# Patient Record
Sex: Female | Born: 1960 | Race: Black or African American | Hispanic: No | Marital: Single | Smoking: Never smoker
Health system: Southern US, Community
[De-identification: ages and names within clinical notes are randomized; demographics above are authoritative.]

## PROBLEM LIST (undated history)

## (undated) DIAGNOSIS — M1990 Unspecified osteoarthritis, unspecified site: Secondary | ICD-10-CM

## (undated) DIAGNOSIS — Z8489 Family history of other specified conditions: Secondary | ICD-10-CM

## (undated) DIAGNOSIS — R55 Syncope and collapse: Secondary | ICD-10-CM

## (undated) DIAGNOSIS — K8020 Calculus of gallbladder without cholecystitis without obstruction: Secondary | ICD-10-CM

## (undated) DIAGNOSIS — M545 Low back pain, unspecified: Secondary | ICD-10-CM

## (undated) DIAGNOSIS — Z87442 Personal history of urinary calculi: Secondary | ICD-10-CM

## (undated) DIAGNOSIS — I10 Essential (primary) hypertension: Secondary | ICD-10-CM

## (undated) DIAGNOSIS — G8929 Other chronic pain: Secondary | ICD-10-CM

## (undated) DIAGNOSIS — N200 Calculus of kidney: Secondary | ICD-10-CM

## (undated) HISTORY — PX: INTRAUTERINE DEVICE INSERTION: SHX323

## (undated) HISTORY — PX: CYSTOSCOPY W/ STONE MANIPULATION: SHX1427

## (undated) HISTORY — PX: LAPAROSCOPIC CHOLECYSTECTOMY: SUR755

---

## 1997-12-09 ENCOUNTER — Encounter: Admission: RE | Admit: 1997-12-09 | Discharge: 1997-12-09 | Payer: Self-pay | Admitting: Family Medicine

## 1997-12-27 ENCOUNTER — Encounter: Admission: RE | Admit: 1997-12-27 | Discharge: 1997-12-27 | Payer: Self-pay | Admitting: Family Medicine

## 1998-01-17 ENCOUNTER — Encounter: Admission: RE | Admit: 1998-01-17 | Discharge: 1998-01-17 | Payer: Self-pay | Admitting: Family Medicine

## 1998-03-12 ENCOUNTER — Encounter: Admission: RE | Admit: 1998-03-12 | Discharge: 1998-03-12 | Payer: Self-pay | Admitting: Family Medicine

## 1998-03-19 ENCOUNTER — Encounter: Admission: RE | Admit: 1998-03-19 | Discharge: 1998-03-19 | Payer: Self-pay | Admitting: Family Medicine

## 1998-03-21 ENCOUNTER — Encounter: Admission: RE | Admit: 1998-03-21 | Discharge: 1998-03-21 | Payer: Self-pay | Admitting: Family Medicine

## 1998-04-11 ENCOUNTER — Encounter: Admission: RE | Admit: 1998-04-11 | Discharge: 1998-04-11 | Payer: Self-pay | Admitting: Family Medicine

## 1998-05-30 ENCOUNTER — Encounter: Admission: RE | Admit: 1998-05-30 | Discharge: 1998-05-30 | Payer: Self-pay | Admitting: Family Medicine

## 1998-07-21 ENCOUNTER — Encounter: Admission: RE | Admit: 1998-07-21 | Discharge: 1998-07-21 | Payer: Self-pay | Admitting: Family Medicine

## 1998-07-21 ENCOUNTER — Emergency Department (HOSPITAL_COMMUNITY): Admission: EM | Admit: 1998-07-21 | Discharge: 1998-07-21 | Payer: Self-pay | Admitting: Emergency Medicine

## 1998-07-23 ENCOUNTER — Encounter: Admission: RE | Admit: 1998-07-23 | Discharge: 1998-07-23 | Payer: Self-pay | Admitting: Family Medicine

## 1998-07-28 ENCOUNTER — Encounter: Admission: RE | Admit: 1998-07-28 | Discharge: 1998-07-28 | Payer: Self-pay | Admitting: Sports Medicine

## 1998-08-18 ENCOUNTER — Encounter: Admission: RE | Admit: 1998-08-18 | Discharge: 1998-08-18 | Payer: Self-pay | Admitting: Family Medicine

## 1998-08-26 ENCOUNTER — Emergency Department (HOSPITAL_COMMUNITY): Admission: EM | Admit: 1998-08-26 | Discharge: 1998-08-27 | Payer: Self-pay | Admitting: Emergency Medicine

## 1998-10-22 ENCOUNTER — Encounter: Admission: RE | Admit: 1998-10-22 | Discharge: 1998-10-22 | Payer: Self-pay | Admitting: Family Medicine

## 1998-10-23 ENCOUNTER — Encounter: Admission: RE | Admit: 1998-10-23 | Discharge: 1998-10-23 | Payer: Self-pay | Admitting: Family Medicine

## 1998-10-28 ENCOUNTER — Encounter: Admission: RE | Admit: 1998-10-28 | Discharge: 1998-10-28 | Payer: Self-pay | Admitting: Family Medicine

## 1998-11-07 ENCOUNTER — Encounter: Admission: RE | Admit: 1998-11-07 | Discharge: 1998-11-07 | Payer: Self-pay | Admitting: Family Medicine

## 1998-11-21 ENCOUNTER — Encounter: Admission: RE | Admit: 1998-11-21 | Discharge: 1998-11-21 | Payer: Self-pay | Admitting: Family Medicine

## 1999-01-30 ENCOUNTER — Encounter: Admission: RE | Admit: 1999-01-30 | Discharge: 1999-01-30 | Payer: Self-pay | Admitting: Family Medicine

## 1999-02-19 ENCOUNTER — Encounter: Admission: RE | Admit: 1999-02-19 | Discharge: 1999-02-19 | Payer: Self-pay | Admitting: Family Medicine

## 1999-03-20 ENCOUNTER — Encounter: Admission: RE | Admit: 1999-03-20 | Discharge: 1999-03-20 | Payer: Self-pay | Admitting: Family Medicine

## 1999-04-28 ENCOUNTER — Encounter: Admission: RE | Admit: 1999-04-28 | Discharge: 1999-04-28 | Payer: Self-pay | Admitting: Sports Medicine

## 1999-04-30 ENCOUNTER — Emergency Department (HOSPITAL_COMMUNITY): Admission: EM | Admit: 1999-04-30 | Discharge: 1999-04-30 | Payer: Self-pay | Admitting: Emergency Medicine

## 1999-04-30 ENCOUNTER — Encounter: Payer: Self-pay | Admitting: Emergency Medicine

## 1999-05-04 ENCOUNTER — Encounter: Admission: RE | Admit: 1999-05-04 | Discharge: 1999-05-04 | Payer: Self-pay | Admitting: Family Medicine

## 1999-05-07 ENCOUNTER — Ambulatory Visit (HOSPITAL_COMMUNITY): Admission: RE | Admit: 1999-05-07 | Discharge: 1999-05-07 | Payer: Self-pay | Admitting: Family Medicine

## 1999-05-07 ENCOUNTER — Encounter: Payer: Self-pay | Admitting: Family Medicine

## 1999-05-13 ENCOUNTER — Encounter: Admission: RE | Admit: 1999-05-13 | Discharge: 1999-05-13 | Payer: Self-pay | Admitting: Family Medicine

## 1999-05-21 ENCOUNTER — Encounter: Admission: RE | Admit: 1999-05-21 | Discharge: 1999-05-21 | Payer: Self-pay | Admitting: Family Medicine

## 1999-05-27 ENCOUNTER — Encounter: Admission: RE | Admit: 1999-05-27 | Discharge: 1999-05-27 | Payer: Self-pay | Admitting: Family Medicine

## 1999-06-11 ENCOUNTER — Encounter: Admission: RE | Admit: 1999-06-11 | Discharge: 1999-06-11 | Payer: Self-pay | Admitting: Family Medicine

## 1999-06-30 ENCOUNTER — Encounter: Admission: RE | Admit: 1999-06-30 | Discharge: 1999-06-30 | Payer: Self-pay | Admitting: Sports Medicine

## 1999-07-22 ENCOUNTER — Encounter: Admission: RE | Admit: 1999-07-22 | Discharge: 1999-07-22 | Payer: Self-pay | Admitting: Family Medicine

## 1999-07-27 ENCOUNTER — Encounter: Admission: RE | Admit: 1999-07-27 | Discharge: 1999-07-27 | Payer: Self-pay | Admitting: Family Medicine

## 1999-07-29 ENCOUNTER — Encounter: Admission: RE | Admit: 1999-07-29 | Discharge: 1999-07-29 | Payer: Self-pay | Admitting: Family Medicine

## 1999-08-03 ENCOUNTER — Encounter: Admission: RE | Admit: 1999-08-03 | Discharge: 1999-08-03 | Payer: Self-pay | Admitting: Family Medicine

## 1999-08-12 ENCOUNTER — Encounter: Admission: RE | Admit: 1999-08-12 | Discharge: 1999-08-12 | Payer: Self-pay | Admitting: Family Medicine

## 1999-08-18 ENCOUNTER — Encounter: Admission: RE | Admit: 1999-08-18 | Discharge: 1999-08-18 | Payer: Self-pay | Admitting: Sports Medicine

## 1999-10-09 ENCOUNTER — Encounter: Admission: RE | Admit: 1999-10-09 | Discharge: 2000-01-07 | Payer: Self-pay | Admitting: Sports Medicine

## 1999-11-04 ENCOUNTER — Encounter: Admission: RE | Admit: 1999-11-04 | Discharge: 1999-11-04 | Payer: Self-pay | Admitting: Family Medicine

## 1999-11-11 ENCOUNTER — Encounter: Admission: RE | Admit: 1999-11-11 | Discharge: 1999-11-11 | Payer: Self-pay | Admitting: Family Medicine

## 2000-01-01 ENCOUNTER — Encounter: Admission: RE | Admit: 2000-01-01 | Discharge: 2000-01-01 | Payer: Self-pay | Admitting: Family Medicine

## 2000-01-08 ENCOUNTER — Encounter: Admission: RE | Admit: 2000-01-08 | Discharge: 2000-02-11 | Payer: Self-pay | Admitting: Sports Medicine

## 2001-02-23 ENCOUNTER — Emergency Department (HOSPITAL_COMMUNITY): Admission: EM | Admit: 2001-02-23 | Discharge: 2001-02-24 | Payer: Self-pay | Admitting: Emergency Medicine

## 2001-02-23 ENCOUNTER — Encounter: Payer: Self-pay | Admitting: Emergency Medicine

## 2001-02-27 ENCOUNTER — Encounter: Admission: RE | Admit: 2001-02-27 | Discharge: 2001-02-27 | Payer: Self-pay | Admitting: Family Medicine

## 2001-03-08 ENCOUNTER — Inpatient Hospital Stay (HOSPITAL_COMMUNITY): Admission: AD | Admit: 2001-03-08 | Discharge: 2001-03-08 | Payer: Self-pay | Admitting: Obstetrics

## 2001-03-08 ENCOUNTER — Encounter: Payer: Self-pay | Admitting: Obstetrics

## 2001-03-10 ENCOUNTER — Encounter: Payer: Self-pay | Admitting: Emergency Medicine

## 2001-03-10 ENCOUNTER — Inpatient Hospital Stay (HOSPITAL_COMMUNITY): Admission: AD | Admit: 2001-03-10 | Discharge: 2001-03-10 | Payer: Self-pay | Admitting: Obstetrics & Gynecology

## 2001-03-13 ENCOUNTER — Encounter (INDEPENDENT_AMBULATORY_CARE_PROVIDER_SITE_OTHER): Payer: Self-pay | Admitting: Specialist

## 2001-03-13 ENCOUNTER — Inpatient Hospital Stay (HOSPITAL_COMMUNITY): Admission: AD | Admit: 2001-03-13 | Discharge: 2001-03-13 | Payer: Self-pay | Admitting: Obstetrics & Gynecology

## 2001-03-15 ENCOUNTER — Encounter: Admission: RE | Admit: 2001-03-15 | Discharge: 2001-03-15 | Payer: Self-pay | Admitting: Family Medicine

## 2001-03-20 ENCOUNTER — Encounter: Admission: RE | Admit: 2001-03-20 | Discharge: 2001-03-20 | Payer: Self-pay | Admitting: Family Medicine

## 2001-03-23 ENCOUNTER — Encounter: Admission: RE | Admit: 2001-03-23 | Discharge: 2001-03-23 | Payer: Self-pay | Admitting: Family Medicine

## 2001-10-11 ENCOUNTER — Encounter: Admission: RE | Admit: 2001-10-11 | Discharge: 2001-10-11 | Payer: Self-pay | Admitting: Family Medicine

## 2001-10-18 ENCOUNTER — Encounter: Admission: RE | Admit: 2001-10-18 | Discharge: 2001-10-18 | Payer: Self-pay | Admitting: Family Medicine

## 2001-10-19 ENCOUNTER — Inpatient Hospital Stay (HOSPITAL_COMMUNITY): Admission: AD | Admit: 2001-10-19 | Discharge: 2001-10-19 | Payer: Self-pay | Admitting: *Deleted

## 2001-10-19 ENCOUNTER — Encounter: Admission: RE | Admit: 2001-10-19 | Discharge: 2001-10-19 | Payer: Self-pay | Admitting: Family Medicine

## 2001-10-25 ENCOUNTER — Encounter: Admission: RE | Admit: 2001-10-25 | Discharge: 2001-10-25 | Payer: Self-pay | Admitting: Family Medicine

## 2001-11-08 ENCOUNTER — Encounter: Admission: RE | Admit: 2001-11-08 | Discharge: 2001-11-08 | Payer: Self-pay | Admitting: Family Medicine

## 2001-11-20 ENCOUNTER — Emergency Department (HOSPITAL_COMMUNITY): Admission: EM | Admit: 2001-11-20 | Discharge: 2001-11-20 | Payer: Self-pay | Admitting: Emergency Medicine

## 2001-12-08 ENCOUNTER — Encounter: Admission: RE | Admit: 2001-12-08 | Discharge: 2001-12-08 | Payer: Self-pay | Admitting: Family Medicine

## 2001-12-11 ENCOUNTER — Encounter: Admission: RE | Admit: 2001-12-11 | Discharge: 2001-12-11 | Payer: Self-pay | Admitting: Family Medicine

## 2001-12-19 ENCOUNTER — Ambulatory Visit (HOSPITAL_COMMUNITY): Admission: RE | Admit: 2001-12-19 | Discharge: 2001-12-19 | Payer: Self-pay | Admitting: *Deleted

## 2001-12-19 ENCOUNTER — Encounter: Payer: Self-pay | Admitting: *Deleted

## 2002-01-08 ENCOUNTER — Encounter: Admission: RE | Admit: 2002-01-08 | Discharge: 2002-01-08 | Payer: Self-pay | Admitting: Family Medicine

## 2002-01-12 ENCOUNTER — Encounter: Admission: RE | Admit: 2002-01-12 | Discharge: 2002-01-12 | Payer: Self-pay | Admitting: Family Medicine

## 2002-01-16 ENCOUNTER — Encounter: Admission: RE | Admit: 2002-01-16 | Discharge: 2002-01-16 | Payer: Self-pay | Admitting: Family Medicine

## 2002-01-26 ENCOUNTER — Encounter: Admission: RE | Admit: 2002-01-26 | Discharge: 2002-01-26 | Payer: Self-pay | Admitting: Family Medicine

## 2002-02-02 ENCOUNTER — Encounter: Admission: RE | Admit: 2002-02-02 | Discharge: 2002-02-02 | Payer: Self-pay | Admitting: Family Medicine

## 2002-02-05 ENCOUNTER — Emergency Department (HOSPITAL_COMMUNITY): Admission: EM | Admit: 2002-02-05 | Discharge: 2002-02-06 | Payer: Self-pay | Admitting: Emergency Medicine

## 2002-02-06 ENCOUNTER — Encounter: Payer: Self-pay | Admitting: Emergency Medicine

## 2002-02-06 ENCOUNTER — Encounter: Admission: RE | Admit: 2002-02-06 | Discharge: 2002-02-06 | Payer: Self-pay | Admitting: Family Medicine

## 2002-02-09 ENCOUNTER — Encounter: Admission: RE | Admit: 2002-02-09 | Discharge: 2002-02-09 | Payer: Self-pay | Admitting: Family Medicine

## 2002-02-27 ENCOUNTER — Encounter: Admission: RE | Admit: 2002-02-27 | Discharge: 2002-02-27 | Payer: Self-pay | Admitting: Family Medicine

## 2002-02-28 ENCOUNTER — Encounter: Admission: RE | Admit: 2002-02-28 | Discharge: 2002-02-28 | Payer: Self-pay | Admitting: Family Medicine

## 2002-03-13 ENCOUNTER — Encounter: Admission: RE | Admit: 2002-03-13 | Discharge: 2002-03-13 | Payer: Self-pay | Admitting: Family Medicine

## 2002-03-20 ENCOUNTER — Encounter: Payer: Self-pay | Admitting: *Deleted

## 2002-03-20 ENCOUNTER — Inpatient Hospital Stay (HOSPITAL_COMMUNITY): Admission: AD | Admit: 2002-03-20 | Discharge: 2002-03-21 | Payer: Self-pay | Admitting: *Deleted

## 2002-03-22 ENCOUNTER — Encounter: Admission: RE | Admit: 2002-03-22 | Discharge: 2002-03-22 | Payer: Self-pay | Admitting: Family Medicine

## 2002-03-23 ENCOUNTER — Encounter: Admission: RE | Admit: 2002-03-23 | Discharge: 2002-03-23 | Payer: Self-pay | Admitting: Family Medicine

## 2002-03-26 ENCOUNTER — Encounter: Admission: RE | Admit: 2002-03-26 | Discharge: 2002-03-26 | Payer: Self-pay | Admitting: Family Medicine

## 2002-03-27 ENCOUNTER — Encounter: Admission: RE | Admit: 2002-03-27 | Discharge: 2002-03-27 | Payer: Self-pay | Admitting: *Deleted

## 2002-04-02 ENCOUNTER — Encounter: Admission: RE | Admit: 2002-04-02 | Discharge: 2002-04-02 | Payer: Self-pay | Admitting: Family Medicine

## 2002-04-09 ENCOUNTER — Encounter: Admission: RE | Admit: 2002-04-09 | Discharge: 2002-04-09 | Payer: Self-pay | Admitting: Family Medicine

## 2002-04-16 ENCOUNTER — Encounter: Admission: RE | Admit: 2002-04-16 | Discharge: 2002-04-16 | Payer: Self-pay | Admitting: Family Medicine

## 2002-04-18 ENCOUNTER — Ambulatory Visit (HOSPITAL_COMMUNITY): Admission: RE | Admit: 2002-04-18 | Discharge: 2002-04-18 | Payer: Self-pay | Admitting: Family Medicine

## 2002-04-24 ENCOUNTER — Encounter: Admission: RE | Admit: 2002-04-24 | Discharge: 2002-04-24 | Payer: Self-pay | Admitting: Family Medicine

## 2002-04-27 ENCOUNTER — Encounter: Admission: RE | Admit: 2002-04-27 | Discharge: 2002-04-27 | Payer: Self-pay | Admitting: Family Medicine

## 2002-04-29 ENCOUNTER — Inpatient Hospital Stay (HOSPITAL_COMMUNITY): Admission: AD | Admit: 2002-04-29 | Discharge: 2002-04-29 | Payer: Self-pay | Admitting: Family Medicine

## 2002-04-30 ENCOUNTER — Encounter: Payer: Self-pay | Admitting: Family Medicine

## 2002-05-02 ENCOUNTER — Encounter: Admission: RE | Admit: 2002-05-02 | Discharge: 2002-05-02 | Payer: Self-pay | Admitting: Family Medicine

## 2002-05-02 ENCOUNTER — Inpatient Hospital Stay (HOSPITAL_COMMUNITY): Admission: AD | Admit: 2002-05-02 | Discharge: 2002-05-02 | Payer: Self-pay | Admitting: Family Medicine

## 2002-05-07 ENCOUNTER — Encounter (INDEPENDENT_AMBULATORY_CARE_PROVIDER_SITE_OTHER): Payer: Self-pay | Admitting: *Deleted

## 2002-05-07 ENCOUNTER — Inpatient Hospital Stay (HOSPITAL_COMMUNITY): Admission: AD | Admit: 2002-05-07 | Discharge: 2002-05-09 | Payer: Self-pay | Admitting: Obstetrics and Gynecology

## 2002-05-10 ENCOUNTER — Encounter: Admission: RE | Admit: 2002-05-10 | Discharge: 2002-06-09 | Payer: Self-pay | Admitting: *Deleted

## 2002-06-01 ENCOUNTER — Encounter: Admission: RE | Admit: 2002-06-01 | Discharge: 2002-06-01 | Payer: Self-pay | Admitting: Family Medicine

## 2002-06-20 ENCOUNTER — Encounter: Admission: RE | Admit: 2002-06-20 | Discharge: 2002-06-20 | Payer: Self-pay | Admitting: Sports Medicine

## 2002-06-20 ENCOUNTER — Other Ambulatory Visit: Admission: RE | Admit: 2002-06-20 | Discharge: 2002-06-20 | Payer: Self-pay | Admitting: Family Medicine

## 2002-06-27 ENCOUNTER — Encounter: Admission: RE | Admit: 2002-06-27 | Discharge: 2002-06-27 | Payer: Self-pay | Admitting: Family Medicine

## 2002-06-28 ENCOUNTER — Encounter: Payer: Self-pay | Admitting: *Deleted

## 2002-06-28 ENCOUNTER — Inpatient Hospital Stay (HOSPITAL_COMMUNITY): Admission: AD | Admit: 2002-06-28 | Discharge: 2002-07-02 | Payer: Self-pay | Admitting: Family Medicine

## 2002-06-28 ENCOUNTER — Ambulatory Visit (HOSPITAL_COMMUNITY): Admission: RE | Admit: 2002-06-28 | Discharge: 2002-06-28 | Payer: Self-pay | Admitting: *Deleted

## 2002-06-29 ENCOUNTER — Encounter: Payer: Self-pay | Admitting: Family Medicine

## 2002-06-30 ENCOUNTER — Encounter: Payer: Self-pay | Admitting: Family Medicine

## 2002-06-30 ENCOUNTER — Encounter (INDEPENDENT_AMBULATORY_CARE_PROVIDER_SITE_OTHER): Payer: Self-pay | Admitting: *Deleted

## 2002-07-06 ENCOUNTER — Encounter: Admission: RE | Admit: 2002-07-06 | Discharge: 2002-07-06 | Payer: Self-pay | Admitting: Family Medicine

## 2002-07-09 ENCOUNTER — Emergency Department (HOSPITAL_COMMUNITY): Admission: EM | Admit: 2002-07-09 | Discharge: 2002-07-09 | Payer: Self-pay | Admitting: Emergency Medicine

## 2002-07-10 ENCOUNTER — Encounter: Admission: RE | Admit: 2002-07-10 | Discharge: 2002-08-09 | Payer: Self-pay | Admitting: *Deleted

## 2002-07-13 ENCOUNTER — Encounter: Admission: RE | Admit: 2002-07-13 | Discharge: 2002-07-13 | Payer: Self-pay | Admitting: Family Medicine

## 2002-07-20 ENCOUNTER — Encounter: Admission: RE | Admit: 2002-07-20 | Discharge: 2002-07-20 | Payer: Self-pay | Admitting: Sports Medicine

## 2002-07-20 ENCOUNTER — Encounter: Admission: RE | Admit: 2002-07-20 | Discharge: 2002-07-20 | Payer: Self-pay | Admitting: Family Medicine

## 2002-07-20 ENCOUNTER — Encounter: Payer: Self-pay | Admitting: Sports Medicine

## 2002-08-10 ENCOUNTER — Encounter: Admission: RE | Admit: 2002-08-10 | Discharge: 2002-09-09 | Payer: Self-pay | Admitting: *Deleted

## 2002-08-10 ENCOUNTER — Encounter: Admission: RE | Admit: 2002-08-10 | Discharge: 2002-08-10 | Payer: Self-pay | Admitting: Family Medicine

## 2002-08-27 ENCOUNTER — Encounter: Admission: RE | Admit: 2002-08-27 | Discharge: 2002-08-27 | Payer: Self-pay | Admitting: Family Medicine

## 2002-08-28 ENCOUNTER — Encounter: Admission: RE | Admit: 2002-08-28 | Discharge: 2002-08-28 | Payer: Self-pay | Admitting: Family Medicine

## 2002-08-30 ENCOUNTER — Encounter: Admission: RE | Admit: 2002-08-30 | Discharge: 2002-08-30 | Payer: Self-pay | Admitting: Family Medicine

## 2002-09-20 ENCOUNTER — Encounter: Admission: RE | Admit: 2002-09-20 | Discharge: 2002-09-20 | Payer: Self-pay | Admitting: Family Medicine

## 2002-10-01 ENCOUNTER — Emergency Department (HOSPITAL_COMMUNITY): Admission: EM | Admit: 2002-10-01 | Discharge: 2002-10-01 | Payer: Self-pay | Admitting: Emergency Medicine

## 2002-10-02 ENCOUNTER — Encounter: Admission: RE | Admit: 2002-10-02 | Discharge: 2002-10-02 | Payer: Self-pay | Admitting: Sports Medicine

## 2002-10-09 ENCOUNTER — Encounter: Admission: RE | Admit: 2002-10-09 | Discharge: 2002-11-08 | Payer: Self-pay | Admitting: *Deleted

## 2002-10-19 ENCOUNTER — Encounter: Admission: RE | Admit: 2002-10-19 | Discharge: 2002-10-19 | Payer: Self-pay | Admitting: Family Medicine

## 2002-11-26 ENCOUNTER — Encounter: Admission: RE | Admit: 2002-11-26 | Discharge: 2002-11-26 | Payer: Self-pay | Admitting: Sports Medicine

## 2002-11-28 ENCOUNTER — Ambulatory Visit (HOSPITAL_COMMUNITY): Admission: RE | Admit: 2002-11-28 | Discharge: 2002-11-28 | Payer: Self-pay | Admitting: Family Medicine

## 2002-11-28 ENCOUNTER — Encounter: Admission: RE | Admit: 2002-11-28 | Discharge: 2002-11-28 | Payer: Self-pay | Admitting: Family Medicine

## 2002-11-29 ENCOUNTER — Encounter: Admission: RE | Admit: 2002-11-29 | Discharge: 2002-11-29 | Payer: Self-pay | Admitting: Family Medicine

## 2002-11-30 ENCOUNTER — Encounter: Payer: Self-pay | Admitting: Sports Medicine

## 2002-11-30 ENCOUNTER — Ambulatory Visit (HOSPITAL_COMMUNITY): Admission: RE | Admit: 2002-11-30 | Discharge: 2002-11-30 | Payer: Self-pay | Admitting: *Deleted

## 2002-11-30 ENCOUNTER — Encounter: Admission: RE | Admit: 2002-11-30 | Discharge: 2002-11-30 | Payer: Self-pay | Admitting: Family Medicine

## 2002-12-04 ENCOUNTER — Inpatient Hospital Stay (HOSPITAL_COMMUNITY): Admission: AD | Admit: 2002-12-04 | Discharge: 2002-12-08 | Payer: Self-pay | Admitting: Family Medicine

## 2002-12-04 ENCOUNTER — Encounter: Admission: RE | Admit: 2002-12-04 | Discharge: 2002-12-04 | Payer: Self-pay | Admitting: Family Medicine

## 2002-12-09 ENCOUNTER — Encounter: Admission: RE | Admit: 2002-12-09 | Discharge: 2003-01-08 | Payer: Self-pay | Admitting: *Deleted

## 2002-12-10 ENCOUNTER — Encounter: Admission: RE | Admit: 2002-12-10 | Discharge: 2002-12-10 | Payer: Self-pay | Admitting: Family Medicine

## 2003-01-08 ENCOUNTER — Encounter: Admission: RE | Admit: 2003-01-08 | Discharge: 2003-01-08 | Payer: Self-pay | Admitting: Family Medicine

## 2003-01-09 ENCOUNTER — Encounter: Admission: RE | Admit: 2003-01-09 | Discharge: 2003-01-09 | Payer: Self-pay | Admitting: Family Medicine

## 2003-01-17 ENCOUNTER — Encounter: Admission: RE | Admit: 2003-01-17 | Discharge: 2003-01-17 | Payer: Self-pay | Admitting: Family Medicine

## 2003-02-08 ENCOUNTER — Encounter: Admission: RE | Admit: 2003-02-08 | Discharge: 2003-03-10 | Payer: Self-pay | Admitting: *Deleted

## 2003-02-18 ENCOUNTER — Encounter: Admission: RE | Admit: 2003-02-18 | Discharge: 2003-02-18 | Payer: Self-pay | Admitting: Sports Medicine

## 2003-03-11 ENCOUNTER — Emergency Department (HOSPITAL_COMMUNITY): Admission: EM | Admit: 2003-03-11 | Discharge: 2003-03-12 | Payer: Self-pay | Admitting: Emergency Medicine

## 2003-03-11 ENCOUNTER — Encounter: Admission: RE | Admit: 2003-03-11 | Discharge: 2003-04-10 | Payer: Self-pay | Admitting: Family Medicine

## 2003-04-09 ENCOUNTER — Encounter: Admission: RE | Admit: 2003-04-09 | Discharge: 2003-04-09 | Payer: Self-pay | Admitting: Family Medicine

## 2003-04-23 ENCOUNTER — Encounter: Admission: RE | Admit: 2003-04-23 | Discharge: 2003-04-23 | Payer: Self-pay | Admitting: Sports Medicine

## 2003-06-04 ENCOUNTER — Encounter: Admission: RE | Admit: 2003-06-04 | Discharge: 2003-06-04 | Payer: Self-pay | Admitting: Family Medicine

## 2003-07-18 ENCOUNTER — Encounter: Admission: RE | Admit: 2003-07-18 | Discharge: 2003-07-18 | Payer: Self-pay | Admitting: Family Medicine

## 2003-07-23 ENCOUNTER — Emergency Department (HOSPITAL_COMMUNITY): Admission: EM | Admit: 2003-07-23 | Discharge: 2003-07-24 | Payer: Self-pay | Admitting: Emergency Medicine

## 2003-07-25 ENCOUNTER — Inpatient Hospital Stay (HOSPITAL_COMMUNITY): Admission: AD | Admit: 2003-07-25 | Discharge: 2003-08-01 | Payer: Self-pay | Admitting: Family Medicine

## 2003-07-25 ENCOUNTER — Encounter: Admission: RE | Admit: 2003-07-25 | Discharge: 2003-07-25 | Payer: Self-pay | Admitting: Sports Medicine

## 2003-08-26 ENCOUNTER — Encounter: Admission: RE | Admit: 2003-08-26 | Discharge: 2003-08-26 | Payer: Self-pay | Admitting: Family Medicine

## 2003-09-30 ENCOUNTER — Encounter: Admission: RE | Admit: 2003-09-30 | Discharge: 2003-09-30 | Payer: Self-pay | Admitting: Family Medicine

## 2003-10-10 ENCOUNTER — Encounter: Admission: RE | Admit: 2003-10-10 | Discharge: 2003-10-10 | Payer: Self-pay | Admitting: Family Medicine

## 2003-10-12 ENCOUNTER — Emergency Department (HOSPITAL_COMMUNITY): Admission: EM | Admit: 2003-10-12 | Discharge: 2003-10-12 | Payer: Self-pay | Admitting: Emergency Medicine

## 2003-10-15 ENCOUNTER — Encounter: Admission: RE | Admit: 2003-10-15 | Discharge: 2003-10-15 | Payer: Self-pay | Admitting: Sports Medicine

## 2003-12-19 ENCOUNTER — Encounter: Admission: RE | Admit: 2003-12-19 | Discharge: 2003-12-19 | Payer: Self-pay | Admitting: Family Medicine

## 2004-01-03 ENCOUNTER — Encounter: Admission: RE | Admit: 2004-01-03 | Discharge: 2004-01-03 | Payer: Self-pay | Admitting: Family Medicine

## 2004-01-10 ENCOUNTER — Encounter: Admission: RE | Admit: 2004-01-10 | Discharge: 2004-01-10 | Payer: Self-pay | Admitting: Family Medicine

## 2004-02-06 ENCOUNTER — Encounter: Admission: RE | Admit: 2004-02-06 | Discharge: 2004-02-06 | Payer: Self-pay | Admitting: Sports Medicine

## 2004-03-19 ENCOUNTER — Emergency Department (HOSPITAL_COMMUNITY): Admission: EM | Admit: 2004-03-19 | Discharge: 2004-03-19 | Payer: Self-pay | Admitting: Emergency Medicine

## 2004-03-20 ENCOUNTER — Ambulatory Visit: Payer: Self-pay | Admitting: Family Medicine

## 2004-03-20 ENCOUNTER — Inpatient Hospital Stay (HOSPITAL_COMMUNITY): Admission: AD | Admit: 2004-03-20 | Discharge: 2004-03-25 | Payer: Self-pay | Admitting: Family Medicine

## 2004-04-13 ENCOUNTER — Ambulatory Visit: Payer: Self-pay | Admitting: Family Medicine

## 2004-04-21 ENCOUNTER — Ambulatory Visit: Payer: Self-pay | Admitting: Sports Medicine

## 2004-05-06 ENCOUNTER — Ambulatory Visit: Payer: Self-pay | Admitting: Sports Medicine

## 2004-05-28 ENCOUNTER — Ambulatory Visit (HOSPITAL_COMMUNITY): Admission: RE | Admit: 2004-05-28 | Discharge: 2004-05-28 | Payer: Self-pay | Admitting: Family Medicine

## 2004-06-20 ENCOUNTER — Emergency Department (HOSPITAL_COMMUNITY): Admission: EM | Admit: 2004-06-20 | Discharge: 2004-06-20 | Payer: Self-pay | Admitting: Emergency Medicine

## 2004-06-21 ENCOUNTER — Encounter (INDEPENDENT_AMBULATORY_CARE_PROVIDER_SITE_OTHER): Payer: Self-pay | Admitting: *Deleted

## 2004-06-22 ENCOUNTER — Inpatient Hospital Stay (HOSPITAL_COMMUNITY): Admission: EM | Admit: 2004-06-22 | Discharge: 2004-06-26 | Payer: Self-pay | Admitting: Emergency Medicine

## 2004-06-29 ENCOUNTER — Ambulatory Visit: Payer: Self-pay | Admitting: Family Medicine

## 2004-07-01 ENCOUNTER — Ambulatory Visit: Payer: Self-pay | Admitting: Family Medicine

## 2004-07-15 ENCOUNTER — Ambulatory Visit: Payer: Self-pay | Admitting: Family Medicine

## 2004-07-17 ENCOUNTER — Emergency Department (HOSPITAL_COMMUNITY): Admission: EM | Admit: 2004-07-17 | Discharge: 2004-07-17 | Payer: Self-pay | Admitting: Emergency Medicine

## 2004-07-19 ENCOUNTER — Emergency Department (HOSPITAL_COMMUNITY): Admission: EM | Admit: 2004-07-19 | Discharge: 2004-07-19 | Payer: Self-pay | Admitting: Emergency Medicine

## 2004-07-20 ENCOUNTER — Emergency Department (HOSPITAL_COMMUNITY): Admission: EM | Admit: 2004-07-20 | Discharge: 2004-07-20 | Payer: Self-pay | Admitting: Emergency Medicine

## 2004-07-21 ENCOUNTER — Ambulatory Visit: Payer: Self-pay | Admitting: Family Medicine

## 2004-07-21 ENCOUNTER — Emergency Department (HOSPITAL_COMMUNITY): Admission: EM | Admit: 2004-07-21 | Discharge: 2004-07-21 | Payer: Self-pay | Admitting: Emergency Medicine

## 2004-07-27 ENCOUNTER — Ambulatory Visit: Payer: Self-pay | Admitting: Family Medicine

## 2004-08-05 ENCOUNTER — Ambulatory Visit: Payer: Self-pay | Admitting: Family Medicine

## 2004-08-07 ENCOUNTER — Encounter: Admission: RE | Admit: 2004-08-07 | Discharge: 2004-09-23 | Payer: Self-pay | Admitting: Sports Medicine

## 2004-08-26 ENCOUNTER — Ambulatory Visit: Payer: Self-pay | Admitting: Family Medicine

## 2004-09-22 ENCOUNTER — Ambulatory Visit: Payer: Self-pay | Admitting: Family Medicine

## 2004-09-26 ENCOUNTER — Ambulatory Visit (HOSPITAL_COMMUNITY): Admission: RE | Admit: 2004-09-26 | Discharge: 2004-09-26 | Payer: Self-pay | Admitting: Vascular Surgery

## 2004-10-07 ENCOUNTER — Ambulatory Visit: Payer: Self-pay | Admitting: Family Medicine

## 2004-11-06 ENCOUNTER — Ambulatory Visit: Payer: Self-pay | Admitting: Family Medicine

## 2004-11-30 ENCOUNTER — Ambulatory Visit: Payer: Self-pay | Admitting: Family Medicine

## 2004-12-29 ENCOUNTER — Ambulatory Visit: Payer: Self-pay | Admitting: Family Medicine

## 2005-02-11 ENCOUNTER — Ambulatory Visit: Payer: Self-pay | Admitting: Sports Medicine

## 2005-03-16 ENCOUNTER — Emergency Department (HOSPITAL_COMMUNITY): Admission: EM | Admit: 2005-03-16 | Discharge: 2005-03-16 | Payer: Self-pay | Admitting: *Deleted

## 2005-03-24 ENCOUNTER — Ambulatory Visit: Payer: Self-pay | Admitting: Family Medicine

## 2005-04-02 ENCOUNTER — Ambulatory Visit: Payer: Self-pay | Admitting: Family Medicine

## 2005-04-15 ENCOUNTER — Emergency Department (HOSPITAL_COMMUNITY): Admission: EM | Admit: 2005-04-15 | Discharge: 2005-04-15 | Payer: Self-pay | Admitting: Emergency Medicine

## 2005-05-31 ENCOUNTER — Emergency Department (HOSPITAL_COMMUNITY): Admission: EM | Admit: 2005-05-31 | Discharge: 2005-05-31 | Payer: Self-pay | Admitting: Emergency Medicine

## 2006-07-17 ENCOUNTER — Emergency Department (HOSPITAL_COMMUNITY): Admission: EM | Admit: 2006-07-17 | Discharge: 2006-07-17 | Payer: Self-pay | Admitting: Emergency Medicine

## 2006-08-18 DIAGNOSIS — E669 Obesity, unspecified: Secondary | ICD-10-CM | POA: Insufficient documentation

## 2006-08-18 DIAGNOSIS — I10 Essential (primary) hypertension: Secondary | ICD-10-CM | POA: Insufficient documentation

## 2006-08-19 ENCOUNTER — Encounter (INDEPENDENT_AMBULATORY_CARE_PROVIDER_SITE_OTHER): Payer: Self-pay | Admitting: *Deleted

## 2006-09-29 ENCOUNTER — Encounter (INDEPENDENT_AMBULATORY_CARE_PROVIDER_SITE_OTHER): Payer: Self-pay | Admitting: Family Medicine

## 2006-09-29 ENCOUNTER — Encounter: Payer: Self-pay | Admitting: *Deleted

## 2006-09-29 ENCOUNTER — Telehealth: Payer: Self-pay | Admitting: *Deleted

## 2006-09-29 ENCOUNTER — Ambulatory Visit: Payer: Self-pay | Admitting: Family Medicine

## 2006-09-29 LAB — CONVERTED CEMR LAB
Beta hcg, urine, semiquantitative: NEGATIVE
GC Probe Amp, Genital: NEGATIVE
Glucose, Urine, Semiquant: NEGATIVE
Ketones, urine, test strip: NEGATIVE
Protein, U semiquant: 30
Urobilinogen, UA: 2

## 2006-09-30 ENCOUNTER — Encounter (INDEPENDENT_AMBULATORY_CARE_PROVIDER_SITE_OTHER): Payer: Self-pay | Admitting: Family Medicine

## 2006-10-05 ENCOUNTER — Telehealth: Payer: Self-pay | Admitting: *Deleted

## 2006-10-11 ENCOUNTER — Ambulatory Visit (HOSPITAL_COMMUNITY): Admission: RE | Admit: 2006-10-11 | Discharge: 2006-10-11 | Payer: Self-pay | Admitting: Family Medicine

## 2007-04-18 ENCOUNTER — Emergency Department (HOSPITAL_COMMUNITY): Admission: EM | Admit: 2007-04-18 | Discharge: 2007-04-18 | Payer: Self-pay | Admitting: Emergency Medicine

## 2007-04-18 ENCOUNTER — Encounter: Payer: Self-pay | Admitting: *Deleted

## 2007-04-26 ENCOUNTER — Emergency Department (HOSPITAL_COMMUNITY): Admission: EM | Admit: 2007-04-26 | Discharge: 2007-04-26 | Payer: Self-pay | Admitting: Emergency Medicine

## 2007-04-26 ENCOUNTER — Ambulatory Visit: Payer: Self-pay | Admitting: Family Medicine

## 2007-04-26 DIAGNOSIS — G43909 Migraine, unspecified, not intractable, without status migrainosus: Secondary | ICD-10-CM | POA: Insufficient documentation

## 2007-04-26 DIAGNOSIS — G43709 Chronic migraine without aura, not intractable, without status migrainosus: Secondary | ICD-10-CM

## 2007-04-28 ENCOUNTER — Ambulatory Visit: Payer: Self-pay | Admitting: Family Medicine

## 2007-04-28 ENCOUNTER — Inpatient Hospital Stay (HOSPITAL_COMMUNITY): Admission: AD | Admit: 2007-04-28 | Discharge: 2007-05-03 | Payer: Self-pay | Admitting: Family Medicine

## 2007-04-28 ENCOUNTER — Telehealth: Payer: Self-pay | Admitting: *Deleted

## 2007-04-28 ENCOUNTER — Encounter: Payer: Self-pay | Admitting: Family Medicine

## 2007-06-29 ENCOUNTER — Ambulatory Visit: Payer: Self-pay | Admitting: Family Medicine

## 2007-07-12 ENCOUNTER — Encounter (INDEPENDENT_AMBULATORY_CARE_PROVIDER_SITE_OTHER): Payer: Self-pay | Admitting: Family Medicine

## 2007-07-12 ENCOUNTER — Ambulatory Visit: Payer: Self-pay | Admitting: Family Medicine

## 2007-07-12 LAB — CONVERTED CEMR LAB
BUN: 8 mg/dL (ref 6–23)
CO2: 24 meq/L (ref 19–32)
Chloride: 105 meq/L (ref 96–112)
Cholesterol: 177 mg/dL (ref 0–200)
Glucose, Bld: 107 mg/dL — ABNORMAL HIGH (ref 70–99)
LDL Cholesterol: 95 mg/dL (ref 0–99)
Potassium: 4.1 meq/L (ref 3.5–5.3)
Sodium: 139 meq/L (ref 135–145)
Total CHOL/HDL Ratio: 2.5

## 2007-07-13 ENCOUNTER — Encounter (INDEPENDENT_AMBULATORY_CARE_PROVIDER_SITE_OTHER): Payer: Self-pay | Admitting: Family Medicine

## 2007-08-01 ENCOUNTER — Encounter: Payer: Self-pay | Admitting: Family Medicine

## 2007-09-05 ENCOUNTER — Encounter (INDEPENDENT_AMBULATORY_CARE_PROVIDER_SITE_OTHER): Payer: Self-pay | Admitting: *Deleted

## 2007-09-18 ENCOUNTER — Encounter (INDEPENDENT_AMBULATORY_CARE_PROVIDER_SITE_OTHER): Payer: Self-pay | Admitting: Family Medicine

## 2007-09-18 ENCOUNTER — Ambulatory Visit: Payer: Self-pay | Admitting: Family Medicine

## 2007-09-22 ENCOUNTER — Ambulatory Visit: Payer: Self-pay | Admitting: Family Medicine

## 2007-09-25 ENCOUNTER — Ambulatory Visit: Payer: Self-pay | Admitting: Family Medicine

## 2007-10-26 ENCOUNTER — Encounter: Payer: Self-pay | Admitting: *Deleted

## 2007-11-29 ENCOUNTER — Telehealth (INDEPENDENT_AMBULATORY_CARE_PROVIDER_SITE_OTHER): Payer: Self-pay | Admitting: Family Medicine

## 2007-11-29 ENCOUNTER — Emergency Department (HOSPITAL_COMMUNITY): Admission: EM | Admit: 2007-11-29 | Discharge: 2007-11-29 | Payer: Self-pay | Admitting: Emergency Medicine

## 2007-12-01 ENCOUNTER — Inpatient Hospital Stay (HOSPITAL_COMMUNITY): Admission: EM | Admit: 2007-12-01 | Discharge: 2007-12-04 | Payer: Self-pay | Admitting: Emergency Medicine

## 2007-12-06 ENCOUNTER — Emergency Department (HOSPITAL_COMMUNITY): Admission: EM | Admit: 2007-12-06 | Discharge: 2007-12-06 | Payer: Self-pay | Admitting: Emergency Medicine

## 2007-12-27 ENCOUNTER — Ambulatory Visit (HOSPITAL_COMMUNITY): Admission: RE | Admit: 2007-12-27 | Discharge: 2007-12-27 | Payer: Self-pay | Admitting: Urology

## 2008-01-11 ENCOUNTER — Emergency Department (HOSPITAL_COMMUNITY): Admission: EM | Admit: 2008-01-11 | Discharge: 2008-01-12 | Payer: Self-pay | Admitting: Emergency Medicine

## 2008-01-11 ENCOUNTER — Ambulatory Visit: Payer: Self-pay | Admitting: Sports Medicine

## 2008-02-12 ENCOUNTER — Ambulatory Visit: Payer: Self-pay | Admitting: Sports Medicine

## 2008-02-12 ENCOUNTER — Encounter (INDEPENDENT_AMBULATORY_CARE_PROVIDER_SITE_OTHER): Payer: Self-pay | Admitting: Family Medicine

## 2008-07-11 ENCOUNTER — Emergency Department (HOSPITAL_COMMUNITY): Admission: EM | Admit: 2008-07-11 | Discharge: 2008-07-11 | Payer: Self-pay | Admitting: Emergency Medicine

## 2008-07-16 ENCOUNTER — Ambulatory Visit: Payer: Self-pay | Admitting: Family Medicine

## 2008-07-16 ENCOUNTER — Encounter (INDEPENDENT_AMBULATORY_CARE_PROVIDER_SITE_OTHER): Payer: Self-pay | Admitting: Family Medicine

## 2008-07-16 DIAGNOSIS — R42 Dizziness and giddiness: Secondary | ICD-10-CM | POA: Insufficient documentation

## 2008-07-16 LAB — CONVERTED CEMR LAB
ALT: 15 units/L (ref 0–35)
AST: 14 units/L (ref 0–37)
Albumin: 4.3 g/dL (ref 3.5–5.2)
Alkaline Phosphatase: 55 units/L (ref 39–117)
Basophils Relative: 0 % (ref 0–1)
Calcium: 9.1 mg/dL (ref 8.4–10.5)
Cholesterol: 171 mg/dL (ref 0–200)
Eosinophils Absolute: 0.1 10*3/uL (ref 0.0–0.7)
LDL Cholesterol: 92 mg/dL (ref 0–99)
Monocytes Absolute: 0.2 10*3/uL (ref 0.1–1.0)
Monocytes Relative: 7 % (ref 3–12)
Neutro Abs: 1.6 10*3/uL — ABNORMAL LOW (ref 1.7–7.7)
Sodium: 139 meq/L (ref 135–145)
Total Bilirubin: 0.4 mg/dL (ref 0.3–1.2)
Triglycerides: 44 mg/dL (ref <150)
VLDL: 9 mg/dL (ref 0–40)
WBC: 3 10*3/uL — ABNORMAL LOW (ref 4.0–10.5)

## 2008-07-26 ENCOUNTER — Encounter: Payer: Self-pay | Admitting: *Deleted

## 2008-07-30 ENCOUNTER — Encounter (INDEPENDENT_AMBULATORY_CARE_PROVIDER_SITE_OTHER): Payer: Self-pay | Admitting: Family Medicine

## 2008-07-30 ENCOUNTER — Ambulatory Visit: Payer: Self-pay | Admitting: Family Medicine

## 2008-07-30 LAB — CONVERTED CEMR LAB
Eosinophils Relative: 3 % (ref 0–5)
HCT: 42.1 % (ref 36.0–46.0)
Hemoglobin: 14.5 g/dL (ref 12.0–15.0)
MCV: 89.6 fL (ref 78.0–100.0)
Neutro Abs: 2.3 10*3/uL (ref 1.7–7.7)
Neutrophils Relative %: 52 % (ref 43–77)
Platelets: 213 10*3/uL (ref 150–400)

## 2008-07-31 ENCOUNTER — Encounter (INDEPENDENT_AMBULATORY_CARE_PROVIDER_SITE_OTHER): Payer: Self-pay | Admitting: Family Medicine

## 2008-08-15 ENCOUNTER — Ambulatory Visit: Payer: Self-pay

## 2008-09-05 ENCOUNTER — Ambulatory Visit: Payer: Self-pay | Admitting: Family Medicine

## 2008-11-22 ENCOUNTER — Ambulatory Visit: Payer: Self-pay | Admitting: Family Medicine

## 2008-11-22 ENCOUNTER — Inpatient Hospital Stay (HOSPITAL_COMMUNITY): Admission: AD | Admit: 2008-11-22 | Discharge: 2008-11-26 | Payer: Self-pay | Admitting: Family Medicine

## 2008-12-26 ENCOUNTER — Telehealth: Payer: Self-pay | Admitting: Family Medicine

## 2008-12-31 ENCOUNTER — Emergency Department (HOSPITAL_COMMUNITY): Admission: EM | Admit: 2008-12-31 | Discharge: 2008-12-31 | Payer: Self-pay | Admitting: Emergency Medicine

## 2009-01-01 ENCOUNTER — Encounter: Payer: Self-pay | Admitting: Family Medicine

## 2009-01-03 ENCOUNTER — Ambulatory Visit: Payer: Self-pay | Admitting: Family Medicine

## 2009-01-03 ENCOUNTER — Encounter: Payer: Self-pay | Admitting: Family Medicine

## 2009-01-20 ENCOUNTER — Emergency Department (HOSPITAL_COMMUNITY): Admission: EM | Admit: 2009-01-20 | Discharge: 2009-01-21 | Payer: Self-pay | Admitting: Emergency Medicine

## 2009-01-30 ENCOUNTER — Ambulatory Visit: Payer: Self-pay | Admitting: Family Medicine

## 2009-01-30 DIAGNOSIS — J309 Allergic rhinitis, unspecified: Secondary | ICD-10-CM | POA: Insufficient documentation

## 2009-02-11 ENCOUNTER — Ambulatory Visit: Payer: Self-pay | Admitting: Family Medicine

## 2009-02-28 ENCOUNTER — Ambulatory Visit: Payer: Self-pay | Admitting: Family Medicine

## 2009-03-20 ENCOUNTER — Ambulatory Visit: Payer: Self-pay | Admitting: Family Medicine

## 2009-03-31 ENCOUNTER — Emergency Department (HOSPITAL_COMMUNITY): Admission: EM | Admit: 2009-03-31 | Discharge: 2009-03-31 | Payer: Self-pay | Admitting: Emergency Medicine

## 2009-06-10 IMAGING — CT CT HEAD W/O CM
1 series · 16 of 28 positions shown, 20 images · non-contrast
Comparison: 07/20/2002.

CLINICAL DATA: Headache. History of migraine headaches. Treated hypertension.

HEAD CT WITHOUT CONTRAST
TECHNIQUE: 5mm collimated images were obtained from the base of the skull
through the vertex, according to standard protocol, without contrast.

[Series 2: brain · axial · 0.47mm/px · z∈[+141,+269]mm · 16 of 28 slices shown, 20 images]
[im 2/28  brain]
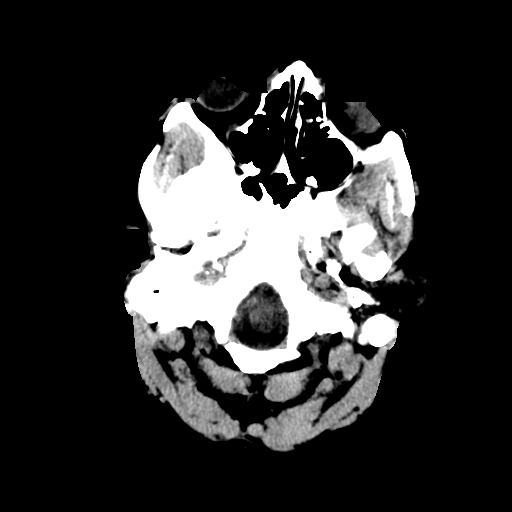
[im 2/28  bone]
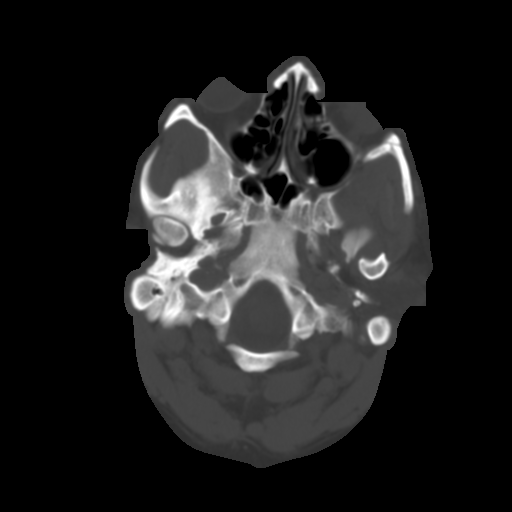
[im 4/28  brain]
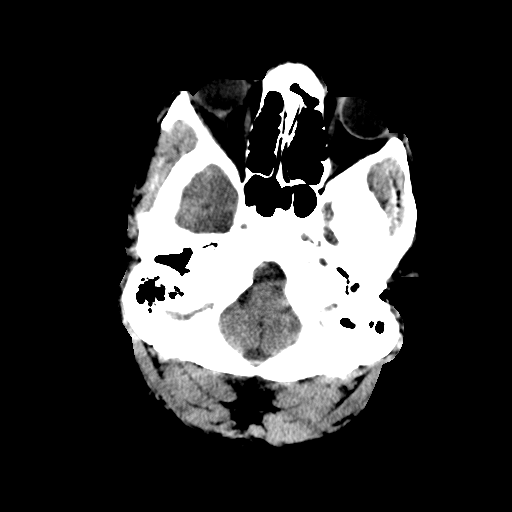
[im 6/28  brain]
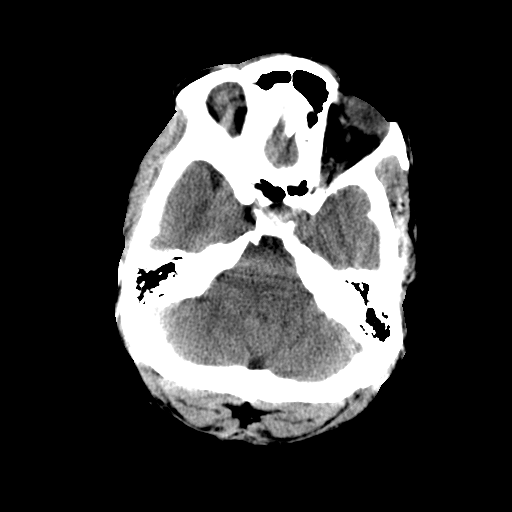
[im 7/28  brain]
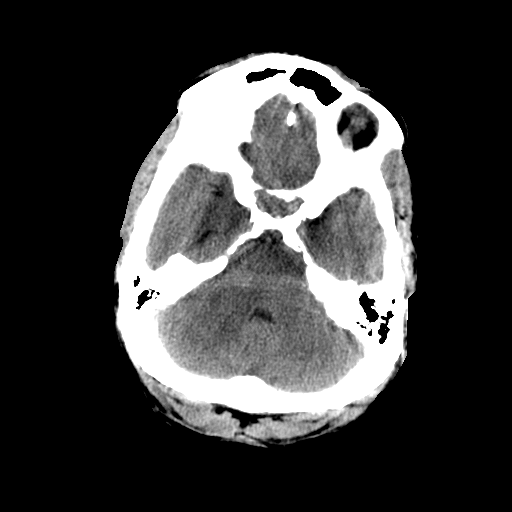
[im 9/28  brain]
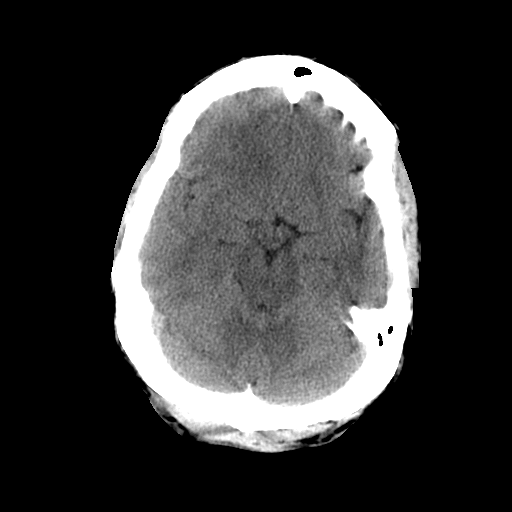
[im 9/28  bone]
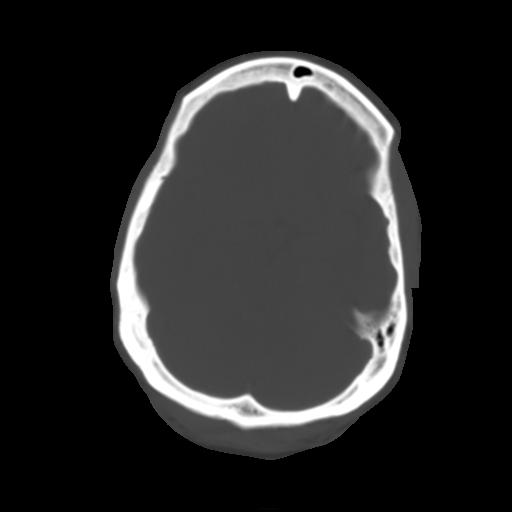
[im 10/28  brain]
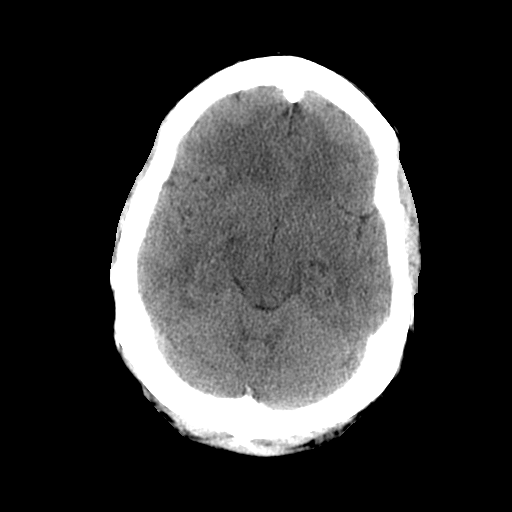
[im 12/28  brain]
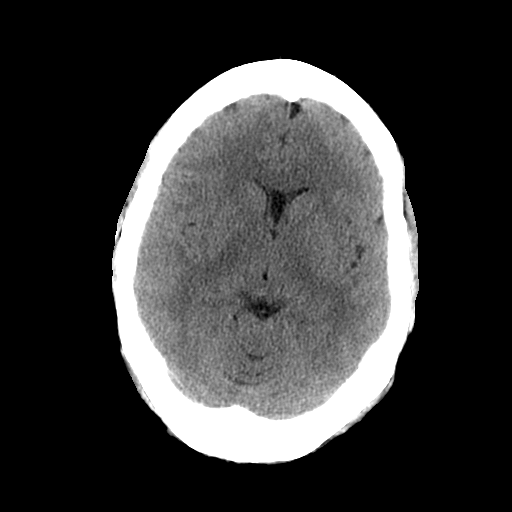
[im 14/28  brain]
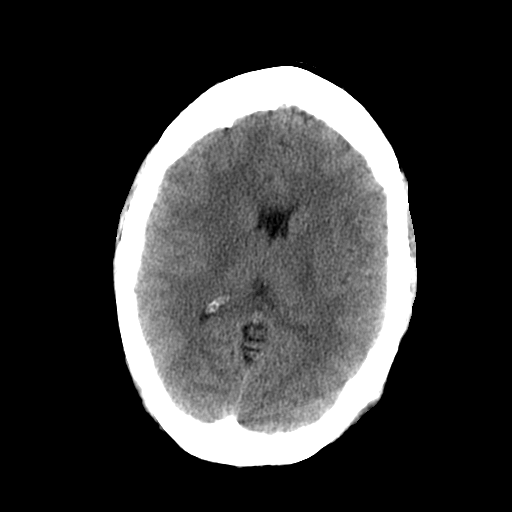
[im 15/28  brain]
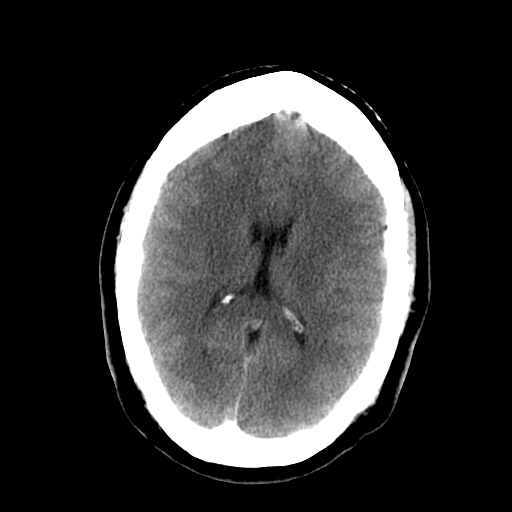
[im 15/28  bone]
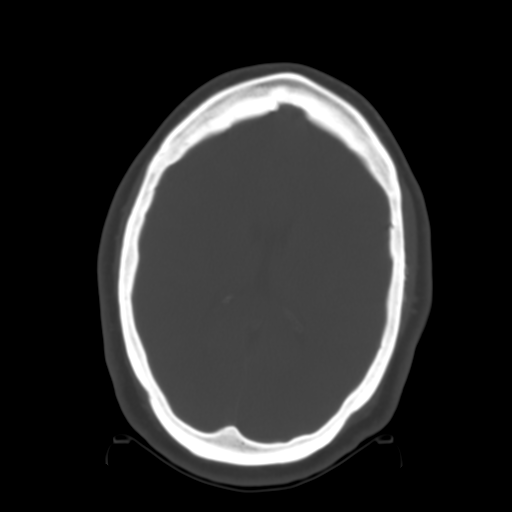
[im 17/28  brain]
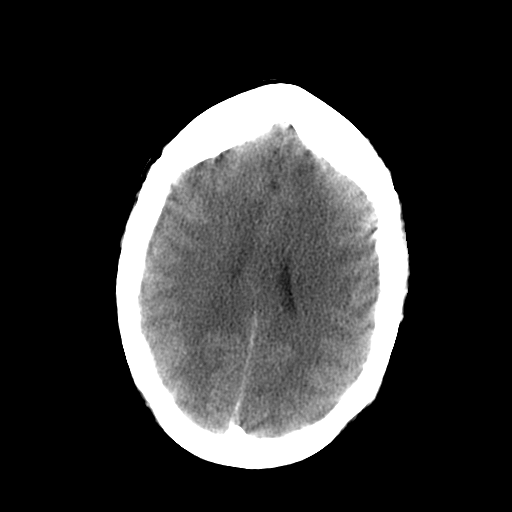
[im 19/28  brain]
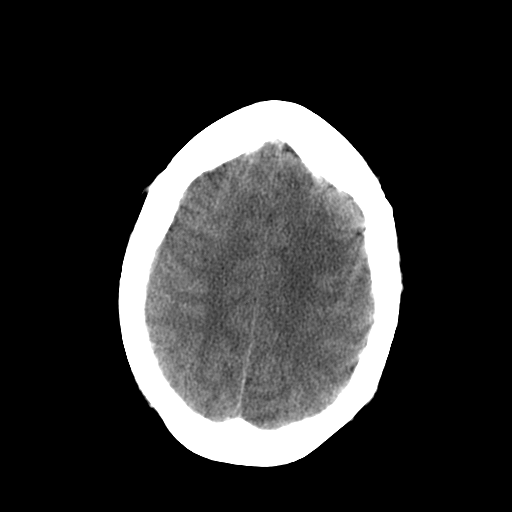
[im 20/28  brain]
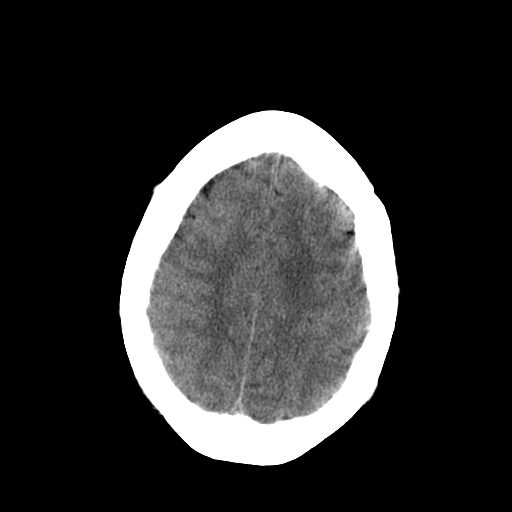
[im 22/28  brain]
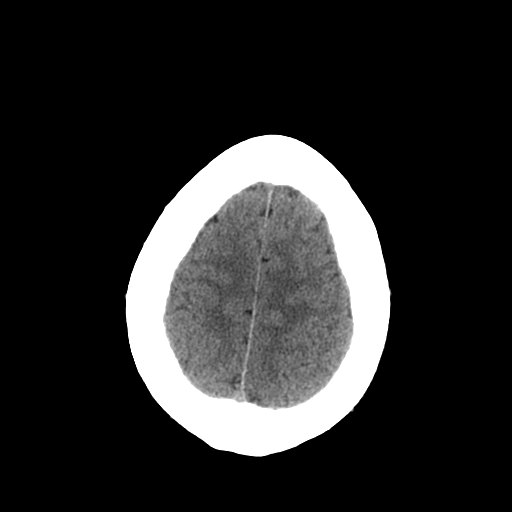
[im 22/28  bone]
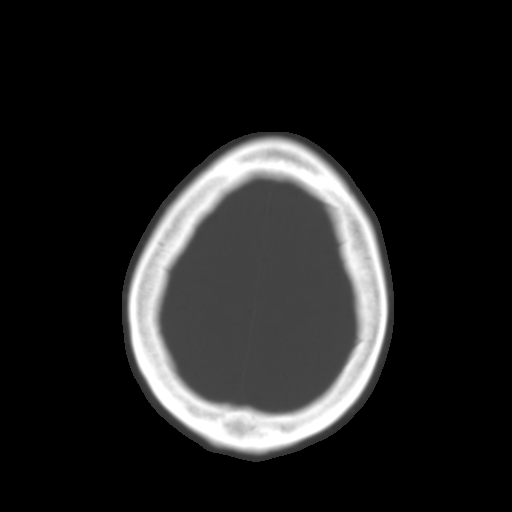
[im 23/28  brain]
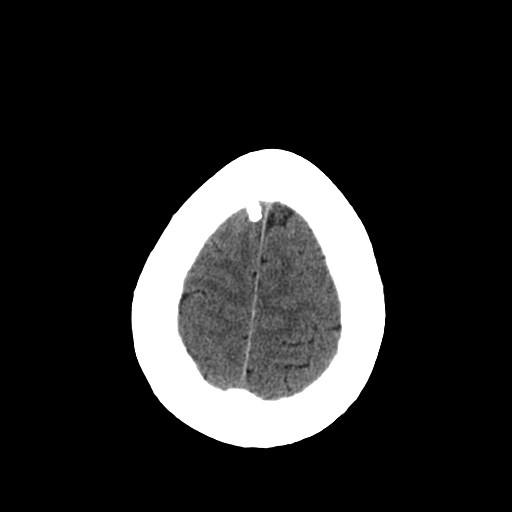
[im 25/28  brain]
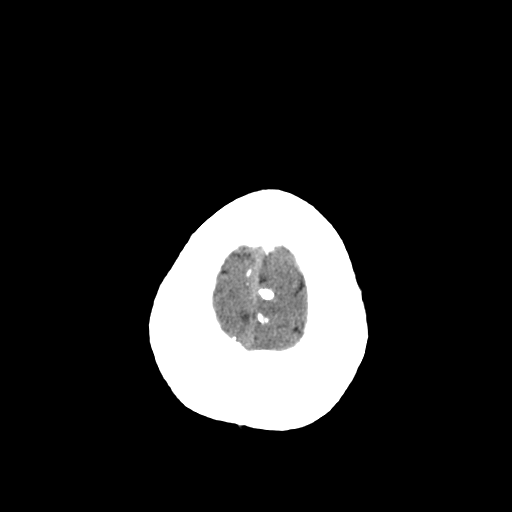
[im 27/28  brain]
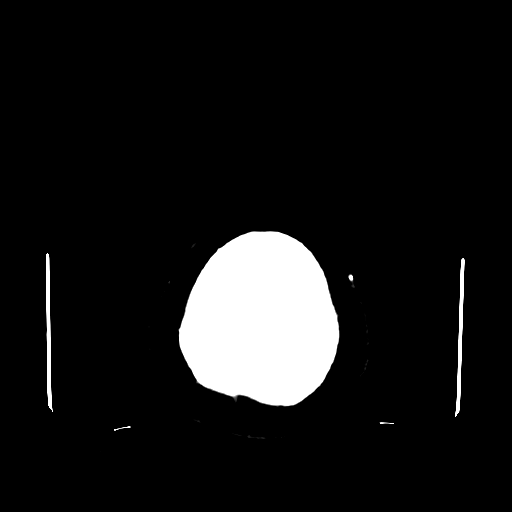

[16 of 28 positions shown; findings below may reference images not displayed]

FINDINGS: Normal appearing cerebral hemispheres and posterior fossa structures.
Normal size and position of the ventricles with cavum septum pellucidum and
vergae noted.  No intracranial hemorrhage, mass, mass effect or areas of acute
infarction identified.  Unremarkable bones and included portions of the
paranasal sinuses.

IMPRESSION

Normal examination.

## 2009-08-21 ENCOUNTER — Emergency Department (HOSPITAL_COMMUNITY): Admission: EM | Admit: 2009-08-21 | Discharge: 2009-08-22 | Payer: Self-pay | Admitting: Emergency Medicine

## 2009-08-27 ENCOUNTER — Emergency Department (HOSPITAL_COMMUNITY): Admission: EM | Admit: 2009-08-27 | Discharge: 2009-08-27 | Payer: Self-pay | Admitting: Emergency Medicine

## 2009-08-28 ENCOUNTER — Telehealth (INDEPENDENT_AMBULATORY_CARE_PROVIDER_SITE_OTHER): Payer: Self-pay | Admitting: *Deleted

## 2009-08-28 ENCOUNTER — Ambulatory Visit: Payer: Self-pay | Admitting: Family Medicine

## 2009-09-05 ENCOUNTER — Telehealth: Payer: Self-pay | Admitting: Family Medicine

## 2009-09-05 ENCOUNTER — Encounter: Payer: Self-pay | Admitting: Family Medicine

## 2009-09-05 ENCOUNTER — Ambulatory Visit: Payer: Self-pay | Admitting: Family Medicine

## 2009-09-05 ENCOUNTER — Inpatient Hospital Stay (HOSPITAL_COMMUNITY): Admission: EM | Admit: 2009-09-05 | Discharge: 2009-09-08 | Payer: Self-pay | Admitting: Family Medicine

## 2009-09-23 ENCOUNTER — Emergency Department (HOSPITAL_BASED_OUTPATIENT_CLINIC_OR_DEPARTMENT_OTHER): Admission: EM | Admit: 2009-09-23 | Discharge: 2009-09-24 | Payer: Self-pay | Admitting: Emergency Medicine

## 2009-09-23 ENCOUNTER — Telehealth: Payer: Self-pay | Admitting: Family Medicine

## 2009-10-24 ENCOUNTER — Telehealth (INDEPENDENT_AMBULATORY_CARE_PROVIDER_SITE_OTHER): Payer: Self-pay | Admitting: *Deleted

## 2009-10-24 ENCOUNTER — Telehealth: Payer: Self-pay | Admitting: Family Medicine

## 2009-10-25 ENCOUNTER — Emergency Department (HOSPITAL_COMMUNITY): Admission: EM | Admit: 2009-10-25 | Discharge: 2009-10-26 | Payer: Self-pay | Admitting: Emergency Medicine

## 2009-10-26 ENCOUNTER — Encounter (INDEPENDENT_AMBULATORY_CARE_PROVIDER_SITE_OTHER): Payer: Self-pay | Admitting: Emergency Medicine

## 2009-10-27 ENCOUNTER — Emergency Department (HOSPITAL_COMMUNITY): Admission: EM | Admit: 2009-10-27 | Discharge: 2009-10-28 | Payer: Self-pay | Admitting: Emergency Medicine

## 2009-10-27 ENCOUNTER — Encounter: Payer: Self-pay | Admitting: Family Medicine

## 2009-10-28 ENCOUNTER — Ambulatory Visit (HOSPITAL_COMMUNITY): Admission: RE | Admit: 2009-10-28 | Discharge: 2009-10-28 | Payer: Self-pay | Admitting: Emergency Medicine

## 2009-10-28 ENCOUNTER — Ambulatory Visit: Payer: Self-pay | Admitting: Family Medicine

## 2009-10-28 ENCOUNTER — Ambulatory Visit: Payer: Self-pay | Admitting: Surgery

## 2009-10-28 ENCOUNTER — Encounter (INDEPENDENT_AMBULATORY_CARE_PROVIDER_SITE_OTHER): Payer: Self-pay | Admitting: Emergency Medicine

## 2009-10-28 ENCOUNTER — Encounter: Payer: Self-pay | Admitting: Family Medicine

## 2009-10-30 ENCOUNTER — Encounter: Payer: Self-pay | Admitting: Family Medicine

## 2009-10-31 ENCOUNTER — Encounter: Payer: Self-pay | Admitting: Family Medicine

## 2009-10-31 ENCOUNTER — Ambulatory Visit: Payer: Self-pay | Admitting: Family Medicine

## 2009-11-11 ENCOUNTER — Emergency Department (HOSPITAL_COMMUNITY): Admission: EM | Admit: 2009-11-11 | Discharge: 2009-11-12 | Payer: Self-pay | Admitting: Emergency Medicine

## 2009-11-14 ENCOUNTER — Telehealth: Payer: Self-pay | Admitting: Family Medicine

## 2009-11-14 ENCOUNTER — Emergency Department (HOSPITAL_COMMUNITY): Admission: EM | Admit: 2009-11-14 | Discharge: 2009-11-14 | Payer: Self-pay | Admitting: Family Medicine

## 2009-11-18 ENCOUNTER — Ambulatory Visit: Payer: Self-pay | Admitting: Family Medicine

## 2009-11-18 ENCOUNTER — Telehealth: Payer: Self-pay | Admitting: Family Medicine

## 2009-11-18 DIAGNOSIS — M549 Dorsalgia, unspecified: Secondary | ICD-10-CM | POA: Insufficient documentation

## 2009-11-24 ENCOUNTER — Ambulatory Visit: Payer: Self-pay | Admitting: Family Medicine

## 2009-12-11 ENCOUNTER — Telehealth: Payer: Self-pay | Admitting: Family Medicine

## 2009-12-16 ENCOUNTER — Encounter: Admission: RE | Admit: 2009-12-16 | Discharge: 2010-02-05 | Payer: Self-pay | Admitting: Family Medicine

## 2009-12-17 ENCOUNTER — Ambulatory Visit: Payer: Self-pay | Admitting: Family Medicine

## 2009-12-17 DIAGNOSIS — M25579 Pain in unspecified ankle and joints of unspecified foot: Secondary | ICD-10-CM

## 2009-12-24 ENCOUNTER — Encounter: Payer: Self-pay | Admitting: Family Medicine

## 2009-12-24 ENCOUNTER — Ambulatory Visit: Payer: Self-pay

## 2009-12-26 ENCOUNTER — Emergency Department (HOSPITAL_COMMUNITY): Admission: EM | Admit: 2009-12-26 | Discharge: 2009-12-26 | Payer: Self-pay | Admitting: Emergency Medicine

## 2009-12-30 ENCOUNTER — Telehealth: Payer: Self-pay | Admitting: Family Medicine

## 2010-01-02 ENCOUNTER — Ambulatory Visit: Payer: Self-pay | Admitting: Family Medicine

## 2010-01-08 ENCOUNTER — Ambulatory Visit: Payer: Self-pay | Admitting: Family Medicine

## 2010-01-13 ENCOUNTER — Ambulatory Visit: Payer: Self-pay | Admitting: Family Medicine

## 2010-01-13 ENCOUNTER — Encounter: Admission: RE | Admit: 2010-01-13 | Discharge: 2010-01-13 | Payer: Self-pay | Admitting: Family Medicine

## 2010-01-14 ENCOUNTER — Ambulatory Visit: Payer: Self-pay | Admitting: Sports Medicine

## 2010-01-18 ENCOUNTER — Encounter: Payer: Self-pay | Admitting: Sports Medicine

## 2010-01-27 ENCOUNTER — Ambulatory Visit: Payer: Self-pay | Admitting: Sports Medicine

## 2010-02-03 ENCOUNTER — Ambulatory Visit: Payer: Self-pay | Admitting: Family Medicine

## 2010-02-03 DIAGNOSIS — M542 Cervicalgia: Secondary | ICD-10-CM

## 2010-03-16 ENCOUNTER — Telehealth: Payer: Self-pay | Admitting: Family Medicine

## 2010-03-16 ENCOUNTER — Encounter: Payer: Self-pay | Admitting: Emergency Medicine

## 2010-03-17 ENCOUNTER — Inpatient Hospital Stay (HOSPITAL_COMMUNITY): Admission: EM | Admit: 2010-03-17 | Discharge: 2010-03-19 | Payer: Self-pay | Admitting: Family Medicine

## 2010-03-17 ENCOUNTER — Ambulatory Visit: Payer: Self-pay | Admitting: Family Medicine

## 2010-03-19 ENCOUNTER — Encounter: Payer: Self-pay | Admitting: Family Medicine

## 2010-03-19 ENCOUNTER — Ambulatory Visit: Payer: Self-pay | Admitting: Vascular Surgery

## 2010-04-24 ENCOUNTER — Encounter: Payer: Self-pay | Admitting: Family Medicine

## 2010-04-27 ENCOUNTER — Encounter: Payer: Self-pay | Admitting: *Deleted

## 2010-05-06 ENCOUNTER — Ambulatory Visit: Payer: Self-pay | Admitting: Family Medicine

## 2010-05-06 DIAGNOSIS — M79609 Pain in unspecified limb: Secondary | ICD-10-CM | POA: Insufficient documentation

## 2010-05-21 ENCOUNTER — Telehealth: Payer: Self-pay | Admitting: Family Medicine

## 2010-05-26 ENCOUNTER — Ambulatory Visit: Payer: Self-pay | Admitting: Sports Medicine

## 2010-05-26 ENCOUNTER — Encounter: Payer: Self-pay | Admitting: Family Medicine

## 2010-05-26 ENCOUNTER — Telehealth: Payer: Self-pay | Admitting: Family Medicine

## 2010-05-26 DIAGNOSIS — M25569 Pain in unspecified knee: Secondary | ICD-10-CM | POA: Insufficient documentation

## 2010-07-11 ENCOUNTER — Encounter: Payer: Self-pay | Admitting: *Deleted

## 2010-07-12 ENCOUNTER — Encounter: Payer: Self-pay | Admitting: Urology

## 2010-07-12 ENCOUNTER — Encounter: Payer: Self-pay | Admitting: Sports Medicine

## 2010-07-22 DIAGNOSIS — I10 Essential (primary) hypertension: Secondary | ICD-10-CM | POA: Insufficient documentation

## 2010-07-22 DIAGNOSIS — Z79899 Other long term (current) drug therapy: Secondary | ICD-10-CM | POA: Insufficient documentation

## 2010-07-22 DIAGNOSIS — G43909 Migraine, unspecified, not intractable, without status migrainosus: Secondary | ICD-10-CM | POA: Insufficient documentation

## 2010-07-23 NOTE — Assessment & Plan Note (Signed)
Summary: Migraine , elevated BP ,  tingling rt side of body, ER ladt n...   Vital Signs:  Patient profile:   50 year old female Height:      65 inches Weight:      228 pounds Pulse rate:   87 / minute BP sitting:   170 / 102  (right arm) Cuff size:   large  Vitals Entered By: Garen Grams LPN (August 28, 2009 4:25 PM)  Serial Vital Signs/Assessments:  Time      Position  BP       Pulse  Resp  Temp     By                     148/82                         Alvia Grove DO   Primary Care Provider:  Angelena Sole MD   History of Present Illness: 50 yo female, work in, f/u ED visit last night due to complicated migrane with right arm and leg numbness.   Per pt she had a severe migraine yesterday, typical of previous migranes, no focal neuro symptoms, some nausea,  then a syncopal event witnessed by family. no seizure activity. EMS was called and pt eval at [facility] ED.  Only new c/o regarding this event is mild right arm and leg numbness. Not increased since  ED visit last night, but not completely resolved.  No weakness, no focal deficits, no sensory changes last night or today. Extensive workup in ED, including CT of head and cervical spine, MRI of the brain, all WNL.  No code stroke called.  Per pt, Dr. Vickey Huger (neuro) either saw pt in [facility] or was consulted over the phone and recc f/u with neuro office in 5 days.  Pt called office this AM and is awating return call.    Continues to have headache now, improved  in ED last night, but now back to inital severity.  Headache aggervated by light and noise.  +nausea, +right leg and arm numbness. Denies visual changes, chest pain, new or focal weakness, change in gait.  Has not had syncopal event since d/c from ED.     Current Problems (verified): 1)  Insomnia  (ICD-780.52) 2)  Allergic Rhinitis  (ICD-477.9) 3)  Contraceptive Management  (ICD-V25.09) 4)  Neutropenia Unspecified  (ICD-288.00) 5)  Dizziness  (ICD-780.4) 6)  Health Screening   (ICD-V70.0) 7)  Physical Examination  (ICD-V70.0) 8)  Screening For Malignant Neoplasm of The Cervix  (ICD-V76.2) 9)  Migraine, Common W/intractable Migraine  (ICD-346.11) 10)  Abdominal Pain, Lower  (ICD-789.09) 11)  Tension Headache  (ICD-307.81) 12)  Obesity, Nos  (ICD-278.00) 13)  Neck Pain  (ICD-723.1) 14)  Migraine, Unspec., w/o Intractable Migraine  (ICD-346.90) 15)  Hypertension, Benign Systemic  (ICD-401.1) 16)  Back Pain, Low  (ICD-724.2)  Current Medications (verified): 1)  Lisinopril-Hydrochlorothiazide 20-12.5 Mg Tabs (Lisinopril-Hydrochlorothiazide) .... Take 1 Tab By Mouth Two Times A Day 2)  Zonegran 100 Mg  Caps (Zonisamide) .... Take 3 By Mouth At Bedtime 3)  Inderal La 160 Mg Xr24h-Cap (Propranolol Hcl) .... Take One Tablet Daily 4)  Promethazine Hcl 25 Mg  Tabs (Promethazine Hcl) .Marland Kitchen.. 1 Tab By Mouth Q6 Hours As Needed For Nausea 5)  Flonase 50 Mcg/act Susp (Fluticasone Propionate) .... One Spray in Each Nostril Daily As Needed 6)  Promethazine Hcl 25 Mg Supp (Promethazine Hcl) .... Take One  Per Rectum Every 8 Hours As Needed Nausea If Unable To Take By Mouth Medication 7)  Epipen 2-Pak 0.3 Mg/0.60ml (1:1000) Devi (Epinephrine Hcl (Anaphylaxis)) .... Use Epi Pen If Having A Reaction That Is Causing Throat Swelling or Trouble Breathing. Go To Ed If Use This. 8)  Ondansetron Hcl 4 Mg Tabs (Ondansetron Hcl) .... Take One Tablet Every 4 Hours As Needed Nausea 9)  Norco 10-325 Mg Tabs (Hydrocodone-Acetaminophen) .... Take 1 Tab Every 4 Hours As Needed For Migraine 10)  Norvasc 5 Mg Tabs (Amlodipine Besylate) .... One Tablet By Mouth Daily For Blood Pressure  Allergies (verified): 1)  ! Aspirin  Review of Systems       The patient complains of headaches.  The patient denies fever, vision loss, chest pain, syncope, dyspnea on exertion, abdominal pain, incontinence, muscle weakness, transient blindness, and difficulty walking.    Physical Exam  General:  VS reviewed , pt  laying head on exam table with lights off, awake, alert, NAD Head:  atraumatic.   Eyes:  vision grossly intact, pupils equal, pupils round, and pupils reactive to light.   Neck:  full ROM and no masses.   Lungs:  Normal respiratory effort, chest expands symmetrically. Lungs are clear to auscultation, no crackles or wheezes. Heart:  normal rate, regular rhythm, no murmur, no gallop, no rub, and no JVD.   Abdomen:  soft, non-tender, normal bowel sounds, and no distention.   Msk:  normal ROM, no joint tenderness, no joint swelling, and no joint warmth.   Extremities:  No clubbing, cyanosis, edema, or deformity noted with normal full range of motion of all joints.   Neurologic:  alert & oriented X3, mental status normal, speech normal, cranial nerves II-XII intact, no tounge deviation, no facial asymetry. strength normal in all extremities, gait normal, DTRs symmetrical and normal, finger-to-nose normal, sensation on right slight decrease   Skin:  turgor normal and color normal.   Psych:  Oriented X3 and memory intact for recent and remote.     Impression & Recommendations:  Problem # 1:  MIGRAINE, COMMON W/INTRACTABLE MIGRAINE (ICD-346.11) Assessment Deteriorated Repeat manual BP improved, PE reassuring.  Highest documented  BP last night in ED was 147/94, currently 148/82. Reviewed all ER documentation from last night.  Extensive work-up completed, including neuro reccs.  All imaging was negative, labs unremarkable.  EKG WNL. Pt recieved Dilaudid 1mg  x 2, benadryl, zofran, compazine, dexamethasone last night with partial relief.  Multiple prior hosp admissions due to this.  Need to try and break headache or pt will likely need admission and DHE.  Will give morphine IM and phenegren now in clinic (son here and able to drive pt home).  Increase Indural to max dose.  Add percocet and ativan for max home management, only small amount of pills to be used acutely for relief, not appropriate long term tx.   Reviewed pt controlled substance use via [email or web address], appropriate use of previous narcotics, no hx of abuse or misuse. Pt to f/u with neuro and RTC or ED if worsens or no improvement.  Discussed with Dr. Mauricio Po and Sheffield Slider.  Her updated medication list for this problem includes:    Inderal La 160 Mg Xr24h-cap (Propranolol hcl) .Marland Kitchen... Take one tablet daily    Norco 10-325 Mg Tabs (Hydrocodone-acetaminophen) .Marland Kitchen... Take 1 tab every 4 hours as needed for migraine    Percocet 10-325 Mg Tabs (Oxycodone-acetaminophen) .Marland Kitchen... Take 1/2 by mouth every 6 hours as needed for pain  Orders: [facility]-  Est Level  3 (99213) Morphine Sulfate inj 10 mg (J2270) Promethazine up to 50mg  (J2550)  Complete Medication List: 1)  Lisinopril-hydrochlorothiazide 20-12.5 Mg Tabs (Lisinopril-hydrochlorothiazide) .... Take 1 tab by mouth two times a day 2)  Zonegran 100 Mg Caps (Zonisamide) .... Take 3 by mouth at bedtime 3)  Inderal La 160 Mg Xr24h-cap (Propranolol hcl) .... Take one tablet daily 4)  Promethazine Hcl 25 Mg Tabs (Promethazine hcl) .Marland Kitchen.. 1 tab by mouth q6 hours as needed for nausea 5)  Flonase 50 Mcg/act Susp (Fluticasone propionate) .... One spray in each nostril daily as needed 6)  Promethazine Hcl 25 Mg Supp (Promethazine hcl) .... Take one per rectum every 8 hours as needed nausea if unable to take by mouth medication 7)  Epipen 2-pak 0.3 Mg/0.9ml (1:1000) Devi (Epinephrine hcl (anaphylaxis)) .... Use epi pen if having a reaction that is causing throat swelling or trouble breathing. go to ed if use this. 8)  Ondansetron Hcl 4 Mg Tabs (Ondansetron hcl) .... Take one tablet every 4 hours as needed nausea 9)  Norco 10-325 Mg Tabs (Hydrocodone-acetaminophen) .... Take 1 tab every 4 hours as needed for migraine 10)  Norvasc 5 Mg Tabs (Amlodipine besylate) .... One tablet by mouth daily for blood pressure 11)  Ativan 2 Mg Tabs (Lorazepam) .... Take 1/2 pill every 8 hrs as needed 12)  Percocet 10-325 Mg Tabs  (Oxycodone-acetaminophen) .... Take 1/2 by mouth every 6 hours as needed for pain  Patient Instructions: 1)  Nice to meet you 2)  I am giving you a shot of Morphine in the office to help your headache, please do not drive  3)  Take 1/2 Ativan as needed 4)  Take 1/2 percocet as needed 5)  I am increasing your Indural to 240mg  daily 6)  If you still have a heacache tomorrow, please come back 7)  If you have any weakness or changes in numbness and tingling, go to the ED 8)  Make sure you see the neurologist this week Prescriptions: PERCOCET 10-325 MG TABS (OXYCODONE-ACETAMINOPHEN) take 1/2 by mouth every 6 hours as needed for pain  #10 x 0   Entered and Authorized by:   Alvia Grove DO   Signed by:   Alvia Grove DO on 09/03/2009   Method used:   Handwritten   RxID:   [id number] ATIVAN 2 MG TABS (LORAZEPAM) take 1/2 pill every 8 hrs as needed  #10 x 0   Entered and Authorized by:   Alvia Grove DO   Signed by:   Alvia Grove DO on 09/03/2009   Method used:   Handwritten   RxID:   [id number] NORVASC 5 MG TABS (AMLODIPINE BESYLATE) one tablet by mouth daily for blood pressure  #34 x 3   Entered and Authorized by:   Alvia Grove DO   Signed by:   Alvia Grove DO on 08/28/2009   Method used:   Electronically to        [facility] (retail)       [address]       [place], [place]  [address]       Ph: [phone]       Fax: [phone]   RxID:   [id number] INDERAL LA 160 MG XR24H-CAP (PROPRANOLOL HCL) Take one tablet daily  #30 x 6   Entered and Authorized by:   Alvia Grove DO   Signed by:   Alvia Grove DO on  08/28/2009   Method used:   Electronically to        [facility] (retail)       [address]       [place], [place]  [address]       Ph: [phone]       Fax: [phone]   RxID:   [id number] LISINOPRIL-HYDROCHLOROTHIAZIDE 20-12.5 MG TABS (LISINOPRIL-HYDROCHLOROTHIAZIDE)  Take 1 tab by mouth two times a day  #30 x 3   Entered and Authorized by:   Alvia Grove DO   Signed by:   Alvia Grove DO on 08/28/2009   Method used:   Electronically to        [facility] (retail)       [address]       [place], [place]  [address]       Ph: [phone]       Fax: [phone]   RxID:   [id number] ZONEGRAN 100 MG  CAPS (ZONISAMIDE) take 3 by mouth at bedtime  #30 x 6   Entered and Authorized by:   Alvia Grove DO   Signed by:   Alvia Grove DO on 08/28/2009   Method used:   Electronically to        [facility] (retail)       [address]       [place], [place]  [address]       Ph: [phone]       Fax: [phone]   RxID:   [id number]    Medication Administration  Injection # 1:    Medication: Morphine Sulfate inj 10 mg    Diagnosis: MIGRAINE, COMMON W/INTRACTABLE MIGRAINE (ICD-346.11)    Route: IM    Site: RUOQ gluteus    Exp Date: 01/19/2010    Lot #: [id number]    Mfr: [email or web address]    Comments: Patient recieved 4mg  of Morphine    Patient tolerated injection without complications    Given by: Garen Grams LPN (August 28, 2009 5:16 PM)  Injection # 2:    Medication: Promethazine up to 50mg     Diagnosis: MIGRAINE, COMMON W/INTRACTABLE MIGRAINE (ICD-346.11)    Route: IM    Site: LUOQ gluteus    Exp Date: 05/22/2011    Lot #: [id number]    Mfr: [email or web address]    Comments: Patient recieved 25mg  of Promethazine    Patient tolerated injection without complications    Given by: Garen Grams LPN (August 28, 2009 5:16 PM)  Orders Added: 1)  [facility]- Est Level  3 [36629] 2)  Morphine Sulfate inj 10 mg [J2270] 3)  Promethazine up to 50mg  [J2550]

## 2010-07-23 NOTE — Miscellaneous (Signed)
Summary: Handicap placard  pt dropped off form to be completed, placed on Lupita Leash Loring's desk for any clinical completion. ERIN ODELL 05/26/10 11:58 am    Not sure patient qualifies for Handicap sticker.  Will place in Dr. Lelon Perla box for decision.  Terese Door  May 26, 2010 12:10 PM  Please have her explain why she thinks that she needs it. To qualify she would have to have one of the following -Unable to walk 200 ft without stopping to rest -Unable to walk without assistance  I do not remember her having problems with either of these. Angelena Sole MD  May 27, 2010 11:43 AM   Left message on vm for pt to return call..............................................Marland KitchenGaren Grams LPN May 27, 2010 4:07 PM  pt returned call - [phone] De Nurse  May 27, 2010 4:13 PM  Brylin informed that she does not qualitfy for a Handicap Placard.  Pt upset because she states her legs keep swelling and it is hard for her to walk.  I also spoke with Dr. Darrick Penna who examined patient on 05/26/10 and he also felt she did not qualify for placard.  Pt. states understanding.  Terese Door  May 28, 2010 10:30 AM

## 2010-07-23 NOTE — Progress Notes (Signed)
Summary: Rx Req  Phone Note Refill Request Call back at Home Phone [phone] Message from:  Patient  Refills Requested: Medication #1:  PERCOCET 10-325 MG TABS take 1/2 by mouth every 6 hours as needed for pain  Medication #2:  ULTRAM 50 MG TABS 1 tab by mouth every 8 hrs as needed pain ULTRAM GOES [facility].  PT NEVER PICKED UP RX'S DUE TO HAVING TO GO OUT OF TOWN SUDDENLY.  PHARMACY SAYS NEW SCRIPTS MUST BE PROVIDED.  Initial call taken by: Clydell Hakim,  December 11, 2009 12:26 PM    Prescriptions: ULTRAM 50 MG TABS (TRAMADOL HCL) 1 tab by mouth every 8 hrs as needed pain  #30 x 0   Entered and Authorized by:   Angelena Sole MD   Signed by:   Angelena Sole MD on 12/11/2009   Method used:   Telephoned to ...       [facility] ([facility])       [address]       [place], [place]  [address]       Ph: [phone]       Fax: [phone]   RxID:   [id number] PERCOCET 10-325 MG TABS (OXYCODONE-ACETAMINOPHEN) take 1/2 by mouth every 6 hours as needed for pain  #30 x 0   Entered and Authorized by:   Angelena Sole MD   Signed by:   Angelena Sole MD on 12/11/2009   Method used:   Telephoned to ...       [facility] ([facility])       [address]       [place], [place]  [address]       Ph: [phone]       Fax: [phone]   RxID:   [id number]
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
## 2010-07-23 NOTE — Miscellaneous (Signed)
Summary: swollen R leg  Clinical Lists Changes states she went to ED saturday & monday for what she was concerned may be a blood clot. r leg grossly edematous as well as R foot. they tested her, gave her 2 injections in abd & sent her home. she just [facility] & they told her to come here now. states her R toe is numb. tried to get her to stay for appt now. she cannot due to test at school. she has an a avergae & does not want to mess that up. she made an appt at 3 as a work in. aware there may be a waiot. also has a HA & is nauseous. out of nausea meds. has not taken anything for pain in leg. advised tylenol or ibuprofen. she does drink water frequently but says she has passed out a few times. she is adamant that she cannot stay now. told her if she felt worse call 911 & go to ED. she agreed .   Told her to not keep leg in same position, elevate then put down, stand & sit, so not cross legs.Marland KitchenMarland KitchenGolden Circle RN  Oct 28, 2009 11:08 AM
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
## 2010-07-23 NOTE — Miscellaneous (Signed)
Summary: med change  Clinical Lists Changes  Medications: Changed medication from ULTRAM 50 MG TABS (TRAMADOL HCL) 1 tab by mouth two times a day to three times a day as needed pain to ULTRAM 50 MG TABS (TRAMADOL HCL) 1 tab by mouth every 8 hrs as needed pain - Signed Rx of ULTRAM 50 MG TABS (TRAMADOL HCL) 1 tab by mouth every 8 hrs as needed pain;  #30 x 0;  Signed;  Entered by: Angeline Slim MD;  Authorized by: Angeline Slim MD;  Method used: Electronically to [facility] [address] [place], [place], [place]  [address], Ph: [phone], Fax: [phone]    Prescriptions: ULTRAM 50 MG TABS (TRAMADOL HCL) 1 tab by mouth every 8 hrs as needed pain  #30 x 0   Entered and Authorized by:   Angeline Slim MD   Signed by:   Angeline Slim MD on 10/31/2009   Method used:   Electronically to        [facility] (retail)       [address]       [place], [place]  [address]       Ph: [phone]       Fax: [phone]   RxID:   [id number]
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
## 2010-07-23 NOTE — Progress Notes (Signed)
Summary: [facility]  Phone Note From Other Clinic   Caller: Diane with [facility] [phone] ext [phone] Summary of Call: Calling to let us know that patient has a balance due at their office.  They have contacted her to let her know.  Pt told them she had no money to pay off or on the balance.  So until patient is able to pay balance [facility] is unable to schedule her an appointment. Initial call taken by: Terese Door,  May 21, 2010 3:21 PM

## 2010-07-23 NOTE — Miscellaneous (Signed)
Summary: r leg  Clinical Lists Changes R leg is swollen since this am. Painful. "it is really big" feels warm to touch on inside of leg at knee level. Marland Kitchen also has HA, nausea & vomiting. admits to some lower extremity swelling in past few days due to walking all over [facility] in sandals.states this is different. sent to ED now. she agreed with plan.Golden Circle RN  Oct 27, 2009 3:02 PM

## 2010-07-23 NOTE — Progress Notes (Signed)
Summary: Prior Authorization Requests  Phone Note From Pharmacy   Summary of Call: Patient requesting Protonix and Hyzaar. Both need Prior Authorizations. Protonix was given while patient was in hospital and on steroids for migraine. She should not need this anymore. She may use OTC Prilosec or Zantac is she is having some dyspepsia. If dyspepsia/reflux is severe, she may need to f/u with her PCP. Please contact her PCP for PA on Hyzaar. Thanks! Initial call taken by: Helane Rima MD,  December 26, 2008 1:12 PM
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
## 2010-07-23 NOTE — Progress Notes (Signed)
Summary: Rx  Phone Note Refill Request   Refills Requested: Medication #1:  NORVASC 10 MG TABS 1 tab by mouth daily pt needs this Rx to be 60 instead of 30 since she is taking this 2 x daily.   Initial call taken by: Knox Royalty,  May 26, 2010 11:54 AM    Prescriptions: NORVASC 10 MG TABS (AMLODIPINE BESYLATE) 1 tab by mouth daily  #60 x 3   Entered and Authorized by:   Angelena Sole MD   Signed by:   Angelena Sole MD on 05/27/2010   Method used:   Electronically to        [facility] (retail)       [address]       [place], [place]  [address]       Ph: [phone]       Fax: [phone]   RxID:   [id number]
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
## 2010-07-23 NOTE — Progress Notes (Signed)
Summary: triage  Phone Note Call from Patient Call back at Home Phone [phone]   Caller: Patient Summary of Call: pt went to Ed last night for a severe migrain - was told to call here today if she still has tingling sensation - she does and migrain has not gone away - wants to come in today Initial call taken by: De Nurse,  August 28, 2009 12:23 PM  Follow-up for Phone Call        lm Follow-up by: Golden Circle RN,  August 28, 2009 12:31 PM  Additional Follow-up for Phone Call Additional follow up Details #1::        pt returned call Additional Follow-up by: De Nurse,  August 28, 2009 1:44 PM    Additional Follow-up for Phone Call Additional follow up Details #2::    spoke with patient and she continues with severe headache and has  tingling sensation right side of body.  was not able to take medication given  due to vomiting. states BP was quite elevated at ER last night. consulted Dr. Mauricio Po and Dr.Hale. advised for patient to come to the office now for work in appointment. Follow-up by: Theresia Lo RN,  August 28, 2009 2:28 PM

## 2010-07-23 NOTE — Miscellaneous (Signed)
Summary: Seen in ED IN [place]  Received a call asking for our NPI number regarding this pt.  Pt was seen in an ED in [place] for seizures.  No other information available.  Dennison Nancy RN  April 27, 2010 3:36 PM

## 2010-07-23 NOTE — Progress Notes (Signed)
Summary: triage  Phone Note Call from Patient Call back at Home Phone [phone]   Caller: Patient Summary of Call: needs to talk to nurse -  Initial call taken by: De Nurse,  December 30, 2009 4:08 PM  Follow-up for Phone Call        states she needs the form done & faxed. told her md wrote he did not have a form. told her I will look for it. also wants radiology results faxed elsewhere. she will be here to sign a ROI to allow [place] to do that.Golden Circle RN  December 30, 2009 4:16 PM  I do not see any forms in places where it might be. will have to tell her to have them send another. Follow-up by: Golden Circle RN,  December 30, 2009 4:13 PM  Additional Follow-up for Phone Call Additional follow up Details #1::        she brought another with her. place dit in md chart box. pt states this is the 4th one she has brought here. states she will pick it up at 2:30 tomorrow. she is signing a ROI so she can take her imaging reports with her  Additional Follow-up by: Golden Circle RN,  December 30, 2009 4:41 PM    Additional Follow-up for Phone Call Additional follow up Details #2::    Form filled out Follow-up by: Angelena Sole MD,  December 31, 2009 1:33 PM
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
## 2010-07-23 NOTE — Progress Notes (Signed)
Summary: triage  Phone Note Call from Patient Call back at Home Phone [phone]   Caller: Patient Summary of Call: Pt wanting something for pain this afternoon for a headache. Initial call taken by: Clydell Hakim,  Oct 24, 2009 4:42 PM  Follow-up for Phone Call        patient states she is out of percocet . advised will leave a message for her doctor that she needs refill but if headache is so severe she will need to go to urgent care for treatment. she voices understanding. Follow-up by: Theresia Lo RN,  Oct 24, 2009 5:13 PM
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
## 2010-07-23 NOTE — Miscellaneous (Signed)
Summary: walk in  Clinical Lists Changes c/o R hand & wrist pain. swollen with hard blue place on inner wrist. had been in hospital & had multiple IVs in that arm, but not at that spot.  seen by Dr. Rexene Alberts to make sure she was ok to wait until 1:30 appt here. he examined her & states that she will be ok to wait. had taken ibuprofen this am. still has pain.told her to try not to use that hand until seen.Golden Circle RN  Oct 31, 2009 12:27 PM

## 2010-07-23 NOTE — Progress Notes (Signed)
Summary: Rx Req  Phone Note Refill Request Call back at Home Phone [phone] Message from:  Patient  Refills Requested: Medication #1:  PERCOCET 10-325 MG TABS take 1/2 by mouth every 6 hours as needed for pain. Initial call taken by: Clydell Hakim,  Oct 24, 2009 4:41 PM  Follow-up for Phone Call        Rx ready for pick up Follow-up by: Angelena Sole MD,  Oct 27, 2009 10:27 AM    Prescriptions: PERCOCET 10-325 MG TABS (OXYCODONE-ACETAMINOPHEN) take 1/2 by mouth every 6 hours as needed for pain  #10 x 0   Entered and Authorized by:   Angelena Sole MD   Signed by:   Angelena Sole MD on 10/27/2009   Method used:   Print then Give to Patient   RxID:   [id number]

## 2010-07-23 NOTE — Assessment & Plan Note (Signed)
Summary: R leg swollen-see notes//[facility]   Vital Signs:  Patient profile:   50 year old female Height:      65 inches Weight:      229 pounds BMI:     38.25 BSA:     2.10 Temp:     99.0 degrees F Pulse rate:   104 / minute BP sitting:   146 / 92  Vitals Entered By: Jone Baseman CMA (Oct 28, 2009 3:41 PM) CC: right leg and ankle swelling Is Patient Diabetic? No Pain Assessment Patient in pain? yes     Location: both legs Intensity: 10   Primary Care Provider:  Angelena Sole MD  CC:  right leg and ankle swelling.  History of Present Illness: 1) Bilateral leg swelling: Seen at [facility] on 5/7, 5/9 w/ c/o R lower leg and ankle swelling and pain which started about 2 weeks ago, at first intermittent after being on feet all day, then progressed to daily. Given Lovenox SQ at both visit - sent for LE Doppler on 5/10 - per patient, results were negative. Denies trauma, surgery, travel, immobilization, h/o DVT, chest pain, dyspnea, cough, orthopnea, wheeze, PND new medications, NSAID use. Also seen at [facility] on above dates for migraine. Has not taken anything for pain or swelling. Denies high salt intake.   Habits & Providers  Alcohol-Tobacco-Diet     Tobacco Status: never  Current Medications (verified): 1)  Lisinopril-Hydrochlorothiazide 20-12.5 Mg Tabs (Lisinopril-Hydrochlorothiazide) .... Take 1 Tab By Mouth Two Times A Day 2)  Zonegran 100 Mg  Caps (Zonisamide) .... Take 3 By Mouth At Bedtime 3)  Inderal La 160 Mg Xr24h-Cap (Propranolol Hcl) .... Take One Tablet Daily 4)  Promethazine Hcl 25 Mg  Tabs (Promethazine Hcl) .Marland Kitchen.. 1 Tab By Mouth Q6 Hours As Needed For Nausea 5)  Flonase 50 Mcg/act Susp (Fluticasone Propionate) .... One Spray in Each Nostril Daily As Needed 6)  Promethazine Hcl 25 Mg Supp (Promethazine Hcl) .... Take One Per Rectum Every 8 Hours As Needed Nausea If Unable To Take By Mouth Medication 7)  Epipen 2-Pak 0.3 Mg/0.36ml (1:1000) Devi (Epinephrine Hcl  (Anaphylaxis)) .... Use Epi Pen If Having A Reaction That Is Causing Throat Swelling or Trouble Breathing. Go To Ed If Use This. 8)  Ondansetron Hcl 4 Mg Tabs (Ondansetron Hcl) .... Take One Tablet Every 4 Hours As Needed Nausea 9)  Norco 10-325 Mg Tabs (Hydrocodone-Acetaminophen) .... Take 1 Tab Every 4 Hours As Needed For Migraine 10)  Norvasc 5 Mg Tabs (Amlodipine Besylate) .... One Tablet By Mouth Daily For Blood Pressure 11)  Ativan 2 Mg Tabs (Lorazepam) .... Take 1/2 Pill Every 8 Hrs As Needed 12)  Percocet 10-325 Mg Tabs (Oxycodone-Acetaminophen) .... Take 1/2 By Mouth Every 6 Hours As Needed For Pain  Allergies (verified): 1)  ! Aspirin  Physical Exam  General:  VS reviewed, awake, alert, NAD, obese  states "my right leg is much bigger than my left - can't you see?"  Mouth:  moist membranes  Neck:  no JVD  Lungs:  CTAB w/o wheeze or crackles  Heart:  normal rate, regular rhythm, no murmur, no gallop, no rub, and no JVD.   Abdomen:  soft, non-tender, obese, normal bowel sounds Pulses:  2+ radial and pedal, posterior tibialis pulses  Extremities:  1+ bilateral pitting edema calf circumference discrepancy of 0.5 cm measured 10 cm below inferior margin of patella.  mild tenderness to palpation bilateral lower legs no difference in temperature, no palpable cords or  masses, no bruising, -ve Homan's sign  Neurologic:  alert & oriented X3 and cranial nerves II-XII intact.   Additional Exam:  Labs from [facility] on 5/10 UA: 1.012, neg gluc, neg heme, neg prot, neg bili, neg nitrite, neg ketones, neg LE, neg nitrite 139 / 3.6 / 107 /  / 9 / 1.0 < 95 4.7 > 13.8 / 39.0 < 185  INR 1.01 Upreg negative     Venous Doppler RLE:  No evidence of deep vein thrombosis involving the right lower extremity. No evidence of Baker's cyst on the right.   Impression & Recommendations:  Problem # 1:  LEG EDEMA, BILATERAL (ICD-782.3) Assessment New  Review of ER records and patient history and : no  signs DVT, CHF, renal disease, liver disease, anemia, new medication effect, pregnancy. Likely venous insufficiency secondary to obesity. Advised regarding avoiding salt. Advised to wear compression stockings. Advised to elevate feet at night. Advised to lose weight. Patient does not seem satisfied with this answer. Advised that she should follow up with her PCP regarding this if it continues to be a problem.   Her updated medication list for this problem includes:    Lisinopril-hydrochlorothiazide 20-12.5 Mg Tabs (Lisinopril-hydrochlorothiazide) .Marland Kitchen... Take 1 tab by mouth two times a day  Orders: [facility]- Est Level  3 (16109)  Complete Medication List: 1)  Lisinopril-hydrochlorothiazide 20-12.5 Mg Tabs (Lisinopril-hydrochlorothiazide) .... Take 1 tab by mouth two times a day 2)  Zonegran 100 Mg Caps (Zonisamide) .... Take 3 by mouth at bedtime 3)  Inderal La 160 Mg Xr24h-cap (Propranolol hcl) .... Take one tablet daily 4)  Promethazine Hcl 25 Mg Tabs (Promethazine hcl) .Marland Kitchen.. 1 tab by mouth q6 hours as needed for nausea 5)  Flonase 50 Mcg/act Susp (Fluticasone propionate) .... One spray in each nostril daily as needed 6)  Promethazine Hcl 25 Mg Supp (Promethazine hcl) .... Take one per rectum every 8 hours as needed nausea if unable to take by mouth medication 7)  Epipen 2-pak 0.3 Mg/0.52ml (1:1000) Devi (Epinephrine hcl (anaphylaxis)) .... Use epi pen if having a reaction that is causing throat swelling or trouble breathing. go to ed if use this. 8)  Ondansetron Hcl 4 Mg Tabs (Ondansetron hcl) .... Take one tablet every 4 hours as needed nausea 9)  Norco 10-325 Mg Tabs (Hydrocodone-acetaminophen) .... Take 1 tab every 4 hours as needed for migraine 10)  Norvasc 5 Mg Tabs (Amlodipine besylate) .... One tablet by mouth daily for blood pressure 11)  Ativan 2 Mg Tabs (Lorazepam) .... Take 1/2 pill every 8 hrs as needed 12)  Percocet 10-325 Mg Tabs (Oxycodone-acetaminophen) .... Take 1/2 by mouth every 6  hours as needed for pain  Patient Instructions: 1)  It was great to see you today!  2)  Wear compression stockings as directed to help with leg swelling 3)  Avoid salt 4)  Elevate your legs at night to help with leg swelling. 5)  Try to exercise to work on losing weight.  6)  Follow up with your regular doctor as previously scheduled or in 3 months if not scheduled.  Prescriptions: PERCOCET 10-325 MG TABS (OXYCODONE-ACETAMINOPHEN) take 1/2 by mouth every 6 hours as needed for pain  #10 x 0   Entered and Authorized by:   Bobby Rumpf  MD   Signed by:   Bobby Rumpf  MD on 10/28/2009   Method used:   Print then Give to Patient   RxID:   [id number] PROMETHAZINE HCL 25 MG  TABS (  PROMETHAZINE HCL) 1 tab by mouth Q6 hours as needed for nausea  #30 x 1   Entered and Authorized by:   Bobby Rumpf  MD   Signed by:   Bobby Rumpf  MD on 10/28/2009   Method used:   Print then Give to Patient   RxID:   [id number]
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
## 2010-07-23 NOTE — Miscellaneous (Signed)
Summary: compression hose?  Clinical Lists Changes she called from [facility]. I spoke with Roseanne Reno. they need to know knee high, thigh high? what degree of compression. plz write the rx with this info so we can fax to them.Golden Circle RN  Oct 30, 2009 11:14 AM  Medications: Added new medication of V-4 HIGH COMPRESSION HOSE  MISC (ELASTIC BANDAGES & SUPPORTS) Both legs.  Knee High. - Signed Rx of V-4 HIGH COMPRESSION HOSE  MISC (ELASTIC BANDAGES & SUPPORTS) Both legs.  Knee High.;  #2 x 0;  Signed;  Entered by: Angelena Sole MD;  Authorized by: Bobby Rumpf  MD;  Method used: Electronically to [facility] [address], [place], [place], [place]  [address], Ph: [phone], Fax: [phone] Rx of V-4 HIGH COMPRESSION HOSE  MISC (ELASTIC BANDAGES & SUPPORTS) Both legs.  Knee High.;  #2 x 0;  Signed;  Entered by: Golden Circle RN;  Authorized by: Angelena Sole MD;  Method used: Electronically to [facility] [address] [place], [place]  [id number], Ph: [phone], Fax: [phone]    Prescriptions: V-4 HIGH COMPRESSION HOSE  MISC (ELASTIC BANDAGES & SUPPORTS) Both legs.  Knee High.  #2 x 0   Entered by:   Golden Circle RN   Authorized by:   Angelena Sole MD   Signed by:   Golden Circle RN on 10/30/2009   Method used:   Electronically to        [facility]* (retail)       [address]       [place], [place]  [id number]       Ph: [phone]       Fax: [phone]   RxID:   [id number] V-4 HIGH COMPRESSION HOSE  MISC (ELASTIC BANDAGES & SUPPORTS) Both legs.  Knee High.  #2 x 0   Entered by:   Angelena Sole MD   Authorized by:   Bobby Rumpf  MD   Signed by:   Angelena Sole MD on 10/30/2009   Method used:   Electronically to        [facility] (retail)       [address]       [place], [place]  [address]       Ph: [phone]       Fax: [phone]   RxID:    [id number]  re sent to [facility] as pt told me they do not have them at [facility]. I explained when & how to put them on. call for any problems.Golden Circle RN  Oct 30, 2009 1:34 PM

## 2010-07-23 NOTE — Progress Notes (Signed)
Summary: triage  Phone Note Call from Patient Call back at Home Phone [phone]   Caller: Patient Summary of Call: Pt has migraine and wondering if she can be seen today? Initial call taken by: Clydell Hakim,  September 05, 2009 12:04 PM  Follow-up for Phone Call        she has tried all meds given to her. stated she waited to take them until she could not astand it anymore. work in at 3. aware of wait Follow-up by: Golden Circle RN,  September 05, 2009 12:05 PM
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
## 2010-07-23 NOTE — Miscellaneous (Signed)
Summary: Assessment form  Patient needs her assessment sheet signed and faxed to [facility].  She said that the form was given to your nurse at her visit.   Sports meds dr also says that handicapped form will have to be filled out by you. Kirsten Walsh  December 24, 2009 5:14 PM  No forms are in my box and nothing was brought in with her on her visit Angelena Sole MD  December 25, 2009 3:51 PM
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
## 2010-07-23 NOTE — Miscellaneous (Signed)
  Clinical Lists Changes  Problems: Removed problem of OTHER SYNDROMES AFFECTING CERVICAL REGION (ICD-723.8) Removed problem of NAUSEA (ICD-787.02) Removed problem of LEG EDEMA, BILATERAL (ICD-782.3) Removed problem of INSOMNIA (ICD-780.52) Removed problem of NEUTROPENIA UNSPECIFIED (ICD-288.00) Removed problem of HEALTH SCREENING (ICD-V70.0) Removed problem of PHYSICAL EXAMINATION (ICD-V70.0) Removed problem of SCREENING FOR MALIGNANT NEOPLASM OF THE CERVIX (ICD-V76.2) Removed problem of ABDOMINAL PAIN, LOWER (ICD-789.09) Removed problem of TENSION HEADACHE (ICD-307.81)

## 2010-07-23 NOTE — Progress Notes (Signed)
Summary: triage  Phone Note Call from Patient Call back at Home Phone [phone]   Caller: Patient Summary of Call: Pt having migraine.  Asking to be seen this afternoon. Initial call taken by: Clydell Hakim,  September 23, 2009 3:51 PM  Follow-up for Phone Call        line busy Follow-up by: Golden Circle RN,  September 23, 2009 3:57 PM  Additional Follow-up for Phone Call Additional follow up Details #1::        states she still has the migraine from when she was in hospital. taking all meds. unable to see her this late in clinic. sent to UC. asked her to call tomorrow & schedule a hospital f/u appt with her pcp Additional Follow-up by: Golden Circle RN,  September 23, 2009 4:00 PM

## 2010-07-23 NOTE — Miscellaneous (Signed)
Summary: [facility]  [facility]   Imported By: Marily Memos 01/19/2010 10:18:11  _____________________________________________________________________  External Attachment:    Type:   Image     Comment:   External Document

## 2010-07-24 ENCOUNTER — Emergency Department (HOSPITAL_BASED_OUTPATIENT_CLINIC_OR_DEPARTMENT_OTHER)
Admission: EM | Admit: 2010-07-24 | Discharge: 2010-07-24 | Disposition: A | Discharge status: Home / Self Care | Payer: Medicaid Other | Attending: Emergency Medicine | Admitting: Emergency Medicine

## 2010-08-07 ENCOUNTER — Emergency Department (HOSPITAL_BASED_OUTPATIENT_CLINIC_OR_DEPARTMENT_OTHER)
Admission: EM | Admit: 2010-08-07 | Discharge: 2010-08-08 | Disposition: A | Discharge status: Home / Self Care | Payer: Medicaid Other | Attending: Emergency Medicine | Admitting: Emergency Medicine

## 2010-08-07 DIAGNOSIS — G43909 Migraine, unspecified, not intractable, without status migrainosus: Secondary | ICD-10-CM | POA: Insufficient documentation

## 2010-08-07 DIAGNOSIS — Z79899 Other long term (current) drug therapy: Secondary | ICD-10-CM | POA: Insufficient documentation

## 2010-08-07 DIAGNOSIS — I10 Essential (primary) hypertension: Secondary | ICD-10-CM | POA: Insufficient documentation

## 2010-09-03 LAB — COMPREHENSIVE METABOLIC PANEL
ALT: 22 U/L (ref 0–35)
AST: 22 U/L (ref 0–37)
Alkaline Phosphatase: 56 U/L (ref 39–117)
CO2: 26 mEq/L (ref 19–32)
Chloride: 105 mEq/L (ref 96–112)
GFR calc Af Amer: >60 mL/min (ref >60)
GFR calc non Af Amer: >60 mL/min (ref >60)
Potassium: 3.3 mEq/L — ABNORMAL LOW (ref 3.5–5.1)
Sodium: 137 mEq/L (ref 135–145)
Total Bilirubin: 0.8 mg/dL (ref 0.3–1.2)

## 2010-09-03 LAB — BENZODIAZEPINE, QUANTITATIVE, URINE
Flurazepam GC/MS Conf: NEGATIVE NG/ML
Oxazepam GC/MS Conf: NEGATIVE NG/ML
Temazepam GC/MS Conf: NEGATIVE NG/ML

## 2010-09-03 LAB — DIFFERENTIAL
Basophils Absolute: 0 10*3/uL (ref 0.0–0.1)
Lymphocytes Relative: 22 % (ref 12–46)
Neutro Abs: 3.9 10*3/uL (ref 1.7–7.7)
Neutrophils Relative %: 68 % (ref 43–77)

## 2010-09-03 LAB — OPIATE, QUANTITATIVE, URINE
Codeine Urine: NEGATIVE NG/ML
Hydromorphone GC/MS Conf: 300 NG/ML — ABNORMAL HIGH

## 2010-09-03 LAB — URINE MICROSCOPIC-ADD ON

## 2010-09-03 LAB — URINALYSIS, ROUTINE W REFLEX MICROSCOPIC
Glucose, UA: NEGATIVE mg/dL
Hgb urine dipstick: NEGATIVE
Protein, ur: NEGATIVE mg/dL

## 2010-09-03 LAB — DRUGS OF ABUSE SCREEN W/O ALC, ROUTINE URINE
Amphetamine Screen, Ur: NEGATIVE
Benzodiazepines.: POSITIVE — AB
Marijuana Metabolite: NEGATIVE
Phencyclidine (PCP): NEGATIVE

## 2010-09-03 LAB — POCT PREGNANCY, URINE: Preg Test, Ur: NEGATIVE

## 2010-09-03 LAB — CBC
HCT: 38.2 % (ref 36.0–46.0)
HCT: 38.6 % (ref 36.0–46.0)
Hemoglobin: 12.8 g/dL (ref 12.0–15.0)
MCH: 30.8 pg (ref 26.0–34.0)
Platelets: 178 10*3/uL (ref 150–400)
Platelets: 185 10*3/uL (ref 150–400)
RBC: 4.15 MIL/uL (ref 3.87–5.11)
RDW: 14.6 % (ref 11.5–15.5)
RDW: 14.8 % (ref 11.5–15.5)
WBC: 5.7 10*3/uL (ref 4.0–10.5)

## 2010-09-03 LAB — POCT I-STAT, CHEM 8
BUN: 4 mg/dL — ABNORMAL LOW (ref 6–23)
Chloride: 101 mEq/L (ref 96–112)
HCT: 41 % (ref 36.0–46.0)
Sodium: 139 mEq/L (ref 135–145)

## 2010-09-06 LAB — BASIC METABOLIC PANEL
CO2: 23 mEq/L (ref 19–32)
Calcium: 8.8 mg/dL (ref 8.4–10.5)
Chloride: 109 mEq/L (ref 96–112)
GFR calc Af Amer: >60 mL/min (ref >60)
Sodium: 139 mEq/L (ref 135–145)

## 2010-09-06 LAB — DIFFERENTIAL
Eosinophils Absolute: 0.1 10*3/uL (ref 0.0–0.7)
Lymphocytes Relative: 36 % (ref 12–46)
Lymphs Abs: 1.3 10*3/uL (ref 0.7–4.0)
Monocytes Relative: 9 % (ref 3–12)
Neutrophils Relative %: 51 % (ref 43–77)

## 2010-09-06 LAB — URINE MICROSCOPIC-ADD ON

## 2010-09-06 LAB — URINALYSIS, ROUTINE W REFLEX MICROSCOPIC
Glucose, UA: NEGATIVE mg/dL
Hgb urine dipstick: NEGATIVE
Protein, ur: NEGATIVE mg/dL
pH: 7 (ref 5.0–8.0)

## 2010-09-06 LAB — CBC
Hemoglobin: 13.2 g/dL (ref 12.0–15.0)
MCH: 31.3 pg (ref 26.0–34.0)
MCV: 93.7 fL (ref 78.0–100.0)
RBC: 4.2 MIL/uL (ref 3.87–5.11)
WBC: 3.5 10*3/uL — ABNORMAL LOW (ref 4.0–10.5)

## 2010-09-06 LAB — URINE CULTURE

## 2010-09-07 LAB — CBC
HCT: 38.5 % (ref 36.0–46.0)
Hemoglobin: 13.4 g/dL (ref 12.0–15.0)
MCHC: 34.9 g/dL (ref 30.0–36.0)
MCV: 93 fL (ref 78.0–100.0)
RBC: 4.13 MIL/uL (ref 3.87–5.11)
WBC: 6.3 10*3/uL (ref 4.0–10.5)

## 2010-09-07 LAB — URINALYSIS, ROUTINE W REFLEX MICROSCOPIC
Bilirubin Urine: NEGATIVE
Glucose, UA: NEGATIVE mg/dL
Hgb urine dipstick: NEGATIVE
Specific Gravity, Urine: 1.019 (ref 1.005–1.030)
Urobilinogen, UA: 1 mg/dL (ref 0.0–1.0)
pH: 8.5 — ABNORMAL HIGH (ref 5.0–8.0)

## 2010-09-07 LAB — DIFFERENTIAL
Basophils Relative: 0 % (ref 0–1)
Eosinophils Absolute: 0.1 10*3/uL (ref 0.0–0.7)
Lymphs Abs: 1 10*3/uL (ref 0.7–4.0)
Monocytes Absolute: 0.5 10*3/uL (ref 0.1–1.0)
Monocytes Relative: 9 % (ref 3–12)
Neutrophils Relative %: 74 % (ref 43–77)

## 2010-09-08 LAB — POCT I-STAT, CHEM 8
BUN: 9 mg/dL (ref 6–23)
BUN: 9 mg/dL (ref 6–23)
Calcium, Ion: 1.1 mmol/L — ABNORMAL LOW (ref 1.12–1.32)
Calcium, Ion: 1.16 mmol/L (ref 1.12–1.32)
Chloride: 107 mEq/L (ref 96–112)
Chloride: 107 mEq/L (ref 96–112)
Creatinine, Ser: 1 mg/dL (ref 0.4–1.2)
Creatinine, Ser: 0.8 mg/dL (ref 0.4–1.2)
Glucose, Bld: 95 mg/dL (ref 70–99)
Glucose, Bld: 113 mg/dL — ABNORMAL HIGH (ref 70–99)
Potassium: 3.6 mEq/L (ref 3.5–5.1)

## 2010-09-08 LAB — CBC
HCT: 39 % (ref 36.0–46.0)
MCHC: 35.3 g/dL (ref 30.0–36.0)
MCV: 92.4 fL (ref 78.0–100.0)
RBC: 4.22 MIL/uL (ref 3.87–5.11)
WBC: 4.7 10*3/uL (ref 4.0–10.5)

## 2010-09-08 LAB — URINALYSIS, ROUTINE W REFLEX MICROSCOPIC
Bilirubin Urine: NEGATIVE
Glucose, UA: NEGATIVE mg/dL
Hgb urine dipstick: NEGATIVE
Protein, ur: NEGATIVE mg/dL
Specific Gravity, Urine: 1.012 (ref 1.005–1.030)
Urobilinogen, UA: 0.2 mg/dL (ref 0.0–1.0)

## 2010-09-08 LAB — DIFFERENTIAL
Basophils Relative: 1 % (ref 0–1)
Eosinophils Absolute: 0.1 10*3/uL (ref 0.0–0.7)
Eosinophils Relative: 2 % (ref 0–5)
Lymphs Abs: 1.6 10*3/uL (ref 0.7–4.0)
Monocytes Relative: 6 % (ref 3–12)

## 2010-09-10 ENCOUNTER — Emergency Department (HOSPITAL_COMMUNITY)
Admission: EM | Admit: 2010-09-10 | Discharge: 2010-09-11 | Disposition: A | Discharge status: Home / Self Care | Payer: Medicaid Other | Attending: Emergency Medicine | Admitting: Emergency Medicine

## 2010-09-10 ENCOUNTER — Inpatient Hospital Stay (INDEPENDENT_AMBULATORY_CARE_PROVIDER_SITE_OTHER)
Admission: RE | Admit: 2010-09-10 | Discharge: 2010-09-10 | Disposition: A | Discharge status: Home / Self Care | Payer: Medicaid Other | Source: Ambulatory Visit | Attending: Family Medicine | Admitting: Family Medicine

## 2010-09-10 DIAGNOSIS — H53149 Visual discomfort, unspecified: Secondary | ICD-10-CM | POA: Insufficient documentation

## 2010-09-10 DIAGNOSIS — I10 Essential (primary) hypertension: Secondary | ICD-10-CM | POA: Insufficient documentation

## 2010-09-10 DIAGNOSIS — G43909 Migraine, unspecified, not intractable, without status migrainosus: Secondary | ICD-10-CM | POA: Insufficient documentation

## 2010-09-10 DIAGNOSIS — R6889 Other general symptoms and signs: Secondary | ICD-10-CM

## 2010-09-10 DIAGNOSIS — G43009 Migraine without aura, not intractable, without status migrainosus: Secondary | ICD-10-CM

## 2010-09-10 DIAGNOSIS — R110 Nausea: Secondary | ICD-10-CM | POA: Insufficient documentation

## 2010-09-11 LAB — BASIC METABOLIC PANEL
BUN: 8 mg/dL (ref 6–23)
BUN: 9 mg/dL (ref 6–23)
Calcium: 8.7 mg/dL (ref 8.4–10.5)
Chloride: 103 mEq/L (ref 96–112)
Chloride: 104 mEq/L (ref 96–112)
Chloride: 108 mEq/L (ref 96–112)
Creatinine, Ser: 0.9 mg/dL (ref 0.4–1.2)
GFR calc Af Amer: >60 mL/min (ref >60)
GFR calc Af Amer: >60 mL/min (ref >60)
GFR calc non Af Amer: >60 mL/min (ref >60)
GFR calc non Af Amer: >60 mL/min (ref >60)
GFR calc non Af Amer: >60 mL/min (ref >60)
Glucose, Bld: 129 mg/dL — ABNORMAL HIGH (ref 70–99)
Potassium: 3.7 mEq/L (ref 3.5–5.1)
Potassium: 3.8 mEq/L (ref 3.5–5.1)
Sodium: 141 mEq/L (ref 135–145)

## 2010-09-11 LAB — CBC
MCV: 93.9 fL (ref 78.0–100.0)
Platelets: 191 10*3/uL (ref 150–400)
RBC: 4.35 MIL/uL (ref 3.87–5.11)
WBC: 7.2 10*3/uL (ref 4.0–10.5)

## 2010-09-11 LAB — HEMOGLOBIN A1C
Hgb A1c MFr Bld: 6 % (ref 4.6–6.1)
Mean Plasma Glucose: 126 mg/dL

## 2010-09-11 LAB — GLUCOSE, CAPILLARY
Glucose-Capillary: 131 mg/dL — ABNORMAL HIGH (ref 70–99)
Glucose-Capillary: 131 mg/dL — ABNORMAL HIGH (ref 70–99)
Glucose-Capillary: 169 mg/dL — ABNORMAL HIGH (ref 70–99)
Glucose-Capillary: 213 mg/dL — ABNORMAL HIGH (ref 70–99)
Glucose-Capillary: 214 mg/dL — ABNORMAL HIGH (ref 70–99)

## 2010-09-13 LAB — GLUCOSE, CAPILLARY: Glucose-Capillary: 114 mg/dL — ABNORMAL HIGH (ref 70–99)

## 2010-09-13 LAB — COMPREHENSIVE METABOLIC PANEL
ALT: 36 U/L — ABNORMAL HIGH (ref 0–35)
AST: 32 U/L (ref 0–37)
Albumin: 3.8 g/dL (ref 3.5–5.2)
Calcium: 8.8 mg/dL (ref 8.4–10.5)
GFR calc Af Amer: >60 mL/min (ref >60)
Sodium: 137 mEq/L (ref 135–145)
Total Protein: 6.6 g/dL (ref 6.0–8.3)

## 2010-09-13 LAB — CBC
Hemoglobin: 13.1 g/dL (ref 12.0–15.0)
RBC: 4.27 MIL/uL (ref 3.87–5.11)
WBC: 4.9 10*3/uL (ref 4.0–10.5)

## 2010-09-13 LAB — PROTIME-INR: INR: 0.93 (ref 0.00–1.49)

## 2010-09-13 LAB — DIFFERENTIAL
Eosinophils Absolute: 0.2 10*3/uL (ref 0.0–0.7)
Eosinophils Relative: 3 % (ref 0–5)
Lymphs Abs: 1.7 10*3/uL (ref 0.7–4.0)
Monocytes Absolute: 0.4 10*3/uL (ref 0.1–1.0)
Monocytes Relative: 9 % (ref 3–12)
Neutrophils Relative %: 52 % (ref 43–77)

## 2010-09-13 LAB — CK TOTAL AND CKMB (NOT AT ARMC)
Relative Index: INVALID (ref 0.0–2.5)
Total CK: 51 U/L (ref 7–177)

## 2010-09-13 LAB — POCT PREGNANCY, URINE: Preg Test, Ur: NEGATIVE

## 2010-09-24 LAB — D-DIMER, QUANTITATIVE: D-Dimer, Quant: 0.31 ug/mL-FEU (ref 0.00–0.48)

## 2010-09-24 LAB — POCT I-STAT, CHEM 8
Calcium, Ion: 1.16 mmol/L (ref 1.12–1.32)
Chloride: 107 mEq/L (ref 96–112)
HCT: 42 % (ref 36.0–46.0)
Sodium: 141 mEq/L (ref 135–145)

## 2010-09-26 LAB — CBC
Hemoglobin: 14.4 g/dL (ref 12.0–15.0)
MCHC: 34.7 g/dL (ref 30.0–36.0)
MCV: 92.5 fL (ref 78.0–100.0)
RBC: 4.5 MIL/uL (ref 3.87–5.11)

## 2010-09-26 LAB — URINALYSIS, ROUTINE W REFLEX MICROSCOPIC
Bilirubin Urine: NEGATIVE
Hgb urine dipstick: NEGATIVE
Protein, ur: NEGATIVE mg/dL
Urobilinogen, UA: 1 mg/dL (ref 0.0–1.0)

## 2010-09-26 LAB — POCT I-STAT, CHEM 8
Calcium, Ion: 1.07 mmol/L — ABNORMAL LOW (ref 1.12–1.32)
Glucose, Bld: 89 mg/dL (ref 70–99)
HCT: 43 % (ref 36.0–46.0)
Hemoglobin: 14.6 g/dL (ref 12.0–15.0)
TCO2: 26 mmol/L (ref 0–100)

## 2010-09-26 LAB — DIFFERENTIAL
Basophils Relative: 0 % (ref 0–1)
Eosinophils Absolute: 0.1 10*3/uL (ref 0.0–0.7)
Monocytes Absolute: 0.5 10*3/uL (ref 0.1–1.0)
Monocytes Relative: 8 % (ref 3–12)

## 2010-09-26 LAB — URINE CULTURE: Colony Count: 50000

## 2010-09-28 LAB — COMPREHENSIVE METABOLIC PANEL
Albumin: 3.8 g/dL (ref 3.5–5.2)
BUN: 6 mg/dL (ref 6–23)
Chloride: 107 mEq/L (ref 96–112)
Creatinine, Ser: 0.91 mg/dL (ref 0.4–1.2)
GFR calc non Af Amer: >60 mL/min (ref >60)
Total Bilirubin: 0.6 mg/dL (ref 0.3–1.2)

## 2010-09-28 LAB — TSH: TSH: 1.303 u[IU]/mL (ref 0.350–4.500)

## 2010-09-28 LAB — CBC
HCT: 40.3 % (ref 36.0–46.0)
MCV: 94.2 fL (ref 78.0–100.0)
Platelets: 196 10*3/uL (ref 150–400)
WBC: 6.2 10*3/uL (ref 4.0–10.5)

## 2010-09-28 LAB — BASIC METABOLIC PANEL
Calcium: 8.8 mg/dL (ref 8.4–10.5)
Calcium: 8.9 mg/dL (ref 8.4–10.5)
Chloride: 107 mEq/L (ref 96–112)
Creatinine, Ser: 0.98 mg/dL (ref 0.4–1.2)
GFR calc Af Amer: >60 mL/min (ref >60)
GFR calc Af Amer: >60 mL/min (ref >60)
GFR calc non Af Amer: 58 mL/min — ABNORMAL LOW (ref >60)
GFR calc non Af Amer: >60 mL/min (ref >60)
Sodium: 140 mEq/L (ref 135–145)

## 2010-09-28 LAB — MAGNESIUM: Magnesium: 2.1 mg/dL (ref 1.5–2.5)

## 2010-10-02 ENCOUNTER — Ambulatory Visit (INDEPENDENT_AMBULATORY_CARE_PROVIDER_SITE_OTHER): Payer: Medicaid Other | Admitting: Family Medicine

## 2010-10-02 ENCOUNTER — Encounter: Payer: Self-pay | Admitting: Family Medicine

## 2010-10-02 VITALS — BP 170/90 | HR 83 | Wt 223.0 lb

## 2010-10-02 DIAGNOSIS — M79609 Pain in unspecified limb: Secondary | ICD-10-CM

## 2010-10-02 DIAGNOSIS — M25569 Pain in unspecified knee: Secondary | ICD-10-CM

## 2010-10-02 DIAGNOSIS — G43019 Migraine without aura, intractable, without status migrainosus: Secondary | ICD-10-CM

## 2010-10-02 DIAGNOSIS — M549 Dorsalgia, unspecified: Secondary | ICD-10-CM

## 2010-10-02 MED ORDER — DULOXETINE HCL 60 MG PO CPEP: 60.0000 mg | ORAL_CAPSULE | Freq: Every day | ORAL | Status: DC

## 2010-10-02 MED ORDER — CYCLOBENZAPRINE HCL 5 MG PO TABS: 5.0000 mg | ORAL_TABLET | Freq: Three times a day (TID) | ORAL | Status: DC | PRN

## 2010-10-02 NOTE — Patient Instructions (Signed)
I am starting you on a medication called cymbalta.  This is a medication indicated for chronic pain.  I am also giving you some flexeril to help with the spasm.  I want you to come back in two weeks to see how the new medication is working and to see if your migraines are improving.  If you have questions please feel free to call our office.

## 2010-10-06 NOTE — Assessment & Plan Note (Signed)
Likely exacerbated by spasm in upper trapezius, continue propranolol and zonegran for ppx.

## 2010-10-06 NOTE — Assessment & Plan Note (Signed)
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  Cymbalta started continue voltaren.
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
## 2010-10-16 ENCOUNTER — Ambulatory Visit: Payer: Medicaid Other | Admitting: Family Medicine

## 2010-10-28 ENCOUNTER — Ambulatory Visit (INDEPENDENT_AMBULATORY_CARE_PROVIDER_SITE_OTHER): Payer: Medicaid Other | Admitting: Family Medicine

## 2010-10-28 ENCOUNTER — Encounter: Payer: Self-pay | Admitting: Family Medicine

## 2010-10-28 ENCOUNTER — Other Ambulatory Visit (HOSPITAL_COMMUNITY)
Admission: RE | Admit: 2010-10-28 | Discharge: 2010-10-28 | Disposition: A | Discharge status: Home / Self Care | Payer: Medicaid Other | Source: Ambulatory Visit | Attending: Family Medicine | Admitting: Family Medicine

## 2010-10-28 VITALS — BP 145/95 | HR 83 | Temp 98.6°F | Ht 65.0 in | Wt 219.0 lb

## 2010-10-28 DIAGNOSIS — G43019 Migraine without aura, intractable, without status migrainosus: Secondary | ICD-10-CM

## 2010-10-28 DIAGNOSIS — Z124 Encounter for screening for malignant neoplasm of cervix: Secondary | ICD-10-CM

## 2010-10-28 DIAGNOSIS — Z01419 Encounter for gynecological examination (general) (routine) without abnormal findings: Secondary | ICD-10-CM | POA: Insufficient documentation

## 2010-10-28 MED ORDER — TOPIRAMATE 50 MG PO TABS: 50.0000 mg | ORAL_TABLET | Freq: Two times a day (BID) | ORAL | Status: DC
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
## 2010-10-28 NOTE — Patient Instructions (Signed)
I will let you know about the results of your PAP Please schedule an appointment with Dr. Terrilee Files to work on your neck, arm, and leg pain / swelling

## 2010-10-29 ENCOUNTER — Encounter: Payer: Self-pay | Admitting: Family Medicine

## 2010-10-30 ENCOUNTER — Encounter: Payer: Self-pay | Admitting: Family Medicine

## 2010-11-03 NOTE — Discharge Summary (Signed)
NAME:  Kirsten Walsh, Kirsten Walsh            ACCOUNT NO.:  [id number]   MEDICAL RECORD NO.:  [id number]          PATIENT TYPE:  INP   LOCATION:  3743                         FACILITY:  [facility]   PHYSICIAN:  Leighton Roach McDiarmid, M.D.DATE OF BIRTH:  [birth year]   DATE OF ADMISSION:  04/28/2007  DATE OF DISCHARGE:  05/03/2007                               DISCHARGE SUMMARY   DISCHARGE DIAGNOSES:  1. Status migrainosus.  2. Hypertension.   CONSULTS:  Neurology.   PROCEDURE/STUDY:  May 03, 2007, EEG:  Results pending.   BRIEF HISTORY AND PHYSICAL:  A 50 year old [place] American female with  status migrainosus, failed outpatient treatment for 2 days.   HOSPITAL COURSE:  1. Status migrainosus:  The patient presented with intractable      migraine and was admitted for administration of DHE protocol.  As      per protocol, patient received DHE 1 mg IV q.8 hours, Reglan 10 mg      IV 30 minutes prior to DHE, methylprednisolone 100 mg IV q.8 hours      with each DHE.  Vicodin 5/500 mg 1 to 2 tablets by mouth q.4 hours      p.r.n. was started on hospital day number 3 for breakthrough      headaches.  Neurology was consulted on the same.  They recommending      tapering the DHE to 0.75 mg, then 0.5 mg, give Depacon 1 gram IV      x1, give magnesium sulfate 1 gram IV x1, Zonegran 100 mg 1 tablet      p.o. q.h.s. x1 week, then increase to 200 mg q.h.s., continue      Reglan p.r.n. and obtain EEG due to history of syncopal episode.      All recommendations were carried out.  Vicodin was also held on      this day and patient was transitioned to ibuprofen 800 mg by mouth      q.6 hours p.r.n. breakthrough headache.  On the day of discharge,      the patient's headache was improved and she was sent home on the      above-named discharge regimen.  2. Hypertension.  Elevated blood pressures throughout hospitalization,      may reflect elevated pain level, plus a component of underlying      chronic  hypertension.  A blood pressure is necessary to determine      level of elevated pressures.  Outpatient followup for treatment of      elevated pressures if they persists despite headache control.   DISCHARGE MEDICATIONS AND INSTRUCTIONS:  1. Hyzaar 50/12.5 mg 1 tablet p.o. daily.  2. Colace 100 mg 1 tablet p.o. b.i.d.  3. Zonegran 100 mg 1 tablet p.o. q.h.s. x1 week, the 100 mg 2 tablets      p.o. q.h.s.  4. Ibuprofen 800 mg p.o. q.6 hours p.r.n. breakthrough headache.  5. Reglan 10 mg 1 tablet p.o. q.6 hours p.r.n. nausea.  6. Ambien 10 mg p.o. q.h.s.   The opted medications, Topamax, Inderal, Vicodin, Percocet.   FOLLOWUP:  1. Dr. Melynda Ripple, [facility] 1:30 p.m. on Friday,      May 05, 2007.  2. The patient is to make appointment at headache clinic.   ISSUES FOR FOLLOWUP:  Health maintenance, hypertension regimen and  migraine regimen.  Encouraged patient to follow up with headache clinic  and/or neurologist for control of migraines.      Leighton Roach McDiarmid, M.D.  Electronically Signed     TDM/MEDQ  D:  05/03/2007  T:  05/03/2007  Job:  [id number]

## 2010-11-03 NOTE — Procedures (Signed)
EEG NUMBER:  [id number].   REFERRING PHYSICIAN:  Martina Sinner, MD.   CLINICAL HISTORY:  A 50 year old lady with refractory migraine  headaches. EEG has been done to evaluate for possible seizures.   MEDICATION LISTED:  Colace, Ambien, Zonegran, Solu-Medrol, Cozaar,  hydrochlorothiazide, losartan, potassium, ibuprofen, magnesium,  Reglan,  Decadron, Vicodin, DHE.   This is a routine EEG recorded with the patient in awake and drowsy  states using 17 channel machine and standard 10/20 electrode placement.   Background awake rhythm consists of 8 Hz alpha which is of moderate  amplitude synchronous reactive to eye-opening and closure.  Most of the  recording the patient is drowsy.  No paroxysmal epileptiform activity,  spikes or sharp waves are seen. The length of this EEG is 25.9 minutes.  Technical component is average.  Hyperventilation unremarkable.  Photic  stimulation results in no significant abnormalities.  Technical  component of the study is average.  EKG tracing reveals regular sinus  rhythm.   IMPRESSION:  This EEG performed during awake and drowsy states is within  normal limits.  No definite epileptiform features were seen.           ______________________________  Sunny Schlein. Pearlean Brownie, MD     ZOX:WRUE  D:  05/03/2007 18:03:26  T:  05/04/2007 14:16:18  Job #:  [id number]

## 2010-11-03 NOTE — Op Note (Signed)
NAME:  Kirsten Walsh, Kirsten Walsh            ACCOUNT NO.:  [id number]   MEDICAL RECORD NO.:  [id number]          PATIENT TYPE:  INP   LOCATION:  1518                         FACILITY:  [facility]   PHYSICIAN:  Courtney Paris, M.D.DATE OF BIRTH:  [birth year]   DATE OF PROCEDURE:  12/02/2007  DATE OF DISCHARGE:                               OPERATIVE REPORT   PREOPERATIVE DIAGNOSIS:  Right mid/distal ureteral stone with  pyelonephritis.   POSTOPERATIVE DIAGNOSIS:  Right mid/distal ureteral stone with  pyelonephritis.   OPERATION:  Cysto, right retrograde pyelogram, insertion right ureteral  stent.   ANESTHESIA:  General.   SURGEON:  Courtney Paris, M.D.   BRIEF HISTORY:  This 50 year old patient presented with a 6 mm proximal  right ureteral stone on November 29, 2007.  She had no previous stone.  She  was scheduled for lithotripsy on June 15.  However, she came back to the  emergency room today with fever to 103, shaking chills, nausea and  vomiting.  The KUB looked like the stone had migrated somewhat distally.  She is admitted now for emergency stent placement because of  hydronephrosis and pyelonephritis.   PROCEDURE IN DETAIL:  The patient was placed on the operating table in  dorsal lithotomy position.  After satisfactory induction of general  anesthesia was prepped and draped with Betadine and given IV Cipro.  Time-out was performed and the patient identification and side were  again reconfirmed.  The cystoscope was passed into the bladder which was  inspected.  There seemed to be somewhat patchy erythematous areas but no  tumors.  The right orifice was somewhat small.  I had to pass a  guidewire through the end of the six open-ended ureteral catheter to be  able to catheterize the right ureteral orifice.  An occlusive retrograde  was then performed showing that the stone was over the sacrum with  hydronephrosis proximal to this.  A guidewire was then passed under  fluoroscopy  past the stone up to the level of the kidney and a large  amount of pus seemed to come out of the orifice.  Over the guidewire a 6  French x 24 cm length double J ureteral stent was then passed under  fluoroscopy and when the guidewire was removed there was a nice coil in  the renal pelvis and one in the bladder.  The bladder was drained, scope  removed.  The patient taken to recovery room in good condition.  Will be  admitted for extended observation because of her pyelonephritis and  fever.  The lithotripsy will probably be postponed because of her active  infection.      Courtney Paris, M.D.  Electronically Signed    HMK/MEDQ  D:  12/02/2007  T:  12/02/2007  Job:  [id number]   cc:   Valetta Fuller, M.D.  Fax: [phone]

## 2010-11-03 NOTE — Consult Note (Signed)
NAME:  Kirsten Walsh, Kirsten Walsh            ACCOUNT NO.:  [id number]   MEDICAL RECORD NO.:  [id number]          PATIENT TYPE:  INP   LOCATION:  0098                         FACILITY:  [facility]   PHYSICIAN:  Courtney Paris, M.D.DATE OF BIRTH:  24-Jan-1961   DATE OF CONSULTATION:  12/01/2007  DATE OF DISCHARGE:                                 CONSULTATION   ER CONSULT   This 50 year old black female was admitted with 103 fever with nausea,  vomiting, and pain in her right flank.  She came to the emergency room  on November 29, 2007, and they diagnosed on a CT scan a 6 mm proximal  ureteral stone.  She saw Dr. Isabel Caprice the following day, and he set her up  for lithotripsy on the 15th.  She is on Percocet.  Has not had trouble  with urinary tract infections but today has been unable to keep anything  down with nausea and vomiting and a temp as high of 103.  She came to  the emergency room for evaluation.  A KUB showed the stone still present  around L4 in the right proximal ureter.  White count elevated at 13,000.  Hematocrit 34.9.  Electrolytes and renal function tests are normal.   She did not have previous stones.  She did have a gallbladder about five  years ago.   FAMILY/SOCIAL HISTORY:  She is divorced but engaged to be married and  has four children, ages 88-21.   DRUG ALLERGIES:  ASPIRIN.   CURRENT MEDICATIONS:  1. Percocet.  2. Zonegran for migraines.  3. Inderal LA for migraines.  4. Hyzaar.   She does not have any heart or lung problems and does not smoke.   Her 12-point review of systems otherwise negative.   PHYSICAL EXAMINATION:  Blood pressure is 112/70, pulse 127, respirations  20, temperature 102.5.  She is a heavy-set black female complaining of pain in her right flank.  She seems somewhat washed-out.  HEENT:  Clear.  NECK:  Supple.  ABDOMEN:  Soft.  CHEST:  Clear.  Heart sounds normal.  ABDOMEN:  Obese and soft with right CVA tenderness to fist percussion.  PELVIC:   Deferred at this point.  EXTREMITIES:  Negative.  No edema.  Good distal pulses.   IMPRESSION:  Right proximal ureteral stone with pyelonephritis.  Recommend emergency cysto and stent placement.      Courtney Paris, M.D.  Electronically Signed     HMK/MEDQ  D:  12/01/2007  T:  12/02/2007  Job:  [id number]

## 2010-11-03 NOTE — Consult Note (Signed)
NAME:  Kirsten Walsh, Kirsten Walsh            ACCOUNT NO.:  [id number]   MEDICAL RECORD NO.:  [id number]          PATIENT TYPE:  INP   LOCATION:  3743                         FACILITY:  [facility]   PHYSICIAN:  Bevelyn Buckles. Champey, M.D.DATE OF BIRTH:  [birth year]   DATE OF CONSULTATION:  DATE OF DISCHARGE:                                 CONSULTATION   REASON FOR PROCEDURE:  Dr. Yetta Barre.   REASON FOR CONSULTATION:  Migraines.   HISTORY OF PRESENT ILLNESS:  Kirsten Walsh is a 50 year old African-  American female with past medical history of migraines who presented  last week with a 2-week history of migraines/status post migrainosus.  The patient was admitted for DHE protocol.  She has had headaches since  childhood, primarily right sided, frontal radiating to the occipital  region.  Described as throbbing, pulsating and sharp.  Mostly 10/10 in  intensity.  She does have associated nausea, photophobia, phonophobia  and smelling certain smells, worse in her headache. Occasionally she  will see spots during her headache and occasional lightheadedness to  where she did have a syncopal event. In her primary care physician's  office last week, she has had DHE in the past with success. Her  headaches have gradually improved now, 7/10 in intensity.  She denies  any focal weakness, numbness, speech change, swelling problems, chewing  problems or vertigo.   PAST MEDICAL HISTORY:  Positive for migraines and status post  cholecystectomy.   CURRENT MEDICATIONS:  1. DHE.  2. Methylprednisolone.  3. Colace.  4. Reglan, p.r.n.  5. Vicodin p.r.n.   ALLERGIES:  ASPIRIN.   FAMILY HISTORY:  Positive for migraines.  Lives with her husband and  kids.  Occasionally drinks alcohol.  Denies any smoking or drug use.   REVIEW OF SYSTEMS:  Positive as per HPI and also constipation.  Review  of systems negative as per HPI and greater than 7 other organ systems.   PHYSICAL EXAMINATION:  VITAL SIGNS:  Temperature 97.9,  pulse 73,  respirations 16, blood pressure 141/90, O2 saturation 98% on room air.  HEENT:  Normocephalic, atraumatic.  Extraocular muscles intact.  Pupils  are equal.  NECK:  Supple.  No carotid bruits heard.  HEART:  Regular.  LUNGS:  Clear.  ABDOMEN:  Soft.  EXTREMITIES:  Show good pulses.  NEUROLOGIC:  The patient is alert, awake and oriented.  Cranial nerves  II-XII are grossly intact. Motor examination shows good strength and  normal tone in all 4 extremities.  No drift is noted.  Sensory  examination is within normal limits to light touch. Reflexes are trace  to 1+ and symmetric.  Cerebellar function is within normal.  Gait was  not assessed secondary to safety.   CT of head done on April 26, 2007, was within normal limits.  Labs:  Erythrocyte sedimentation rate was 7, CK 51, WBC 4.7, hemoglobin 14.2,  hematocrit 42.1, platelets 167.  Sodium 136, potassium 4.4, chloride  106, CO2 22, BUN 8, creatinine 0.85, glucose 137, calcium 9.3.   IMPRESSION:  This is a 50 year old status migrainosus.  Headaches have  been improved with DHE.  Consider  continuing the current treatment and  considering tapering down the doses of DHE to 0.75, subsequently 0.5 mg.  We will give Depacon 1 g IV times 1 now and recommend giving magnesium  sulfate 1 g IV times 1 now.  We will also start Zonegran 100 mg 1 tablet  p.o. q.h.s. for 1 week then 200 mg q.h.s. Continue the Reglan p.r.n.  discussed this case with the primary service, Dr. Yetta Barre.  I would also  recommend getting an EEG with patient's syncopal event that occurred  last week.  We will follow the patient.      Bevelyn Buckles. Nash Shearer, M.D.  Electronically Signed     DRC/MEDQ  D:  05/01/2007  T:  05/01/2007  Job:  [id number]

## 2010-11-03 NOTE — Discharge Summary (Signed)
NAME:  Kirsten Walsh, Kirsten Walsh            ACCOUNT NO.:  [id number]   MEDICAL RECORD NO.:  [id number]          PATIENT TYPE:  INP   LOCATION:  1518                         FACILITY:  [facility]   PHYSICIAN:  Courtney Paris, M.D.DATE OF BIRTH:  06/06/1961   DATE OF ADMISSION:  12/01/2007  DATE OF DISCHARGE:  12/04/2007                               DISCHARGE SUMMARY   DISCHARGE DIAGNOSES:  1. Right pyelonephritis.  2. Right ureteral stone.  3. Right flank pain.   OPERATIONS AND PROCEDURES:  Cystoscopy with right retrograde pyelogram  and insert right ureteral stent on December 02, 2007.   BRIEF HISTORY:  This 50 year old black female was admitted with a 103  fever with nausea and vomiting and pain in the right flank, came to  emergency room on November 29, 2007.  On a CT scan had a 6-mm proximal  ureteral stone, saw Dr. Isabel Caprice the following day, and he set her up for  lithotripsy on the 15th. She had been on Percocet but had not had  trouble with urinary tract infections, but the day of the admission and  was unable to keep anything down with nausea and vomiting with a  temperature as high as 103. She came to the emergency room for  evaluation.  KUB showed that the stone was still present around L4 in  the right proximal ureter and white count elevated at 13,000, hematocrit  was 34.9.  Electrolytes and renal function tests were normal.  She has  not had previous stones.  She did have a gallbladder operation about 5  years ago.  She was admitted and had cystoscopy and right retrograde  pyelogram. There was pus behind the stone and with the stent.  Her  temperature defervesced and on antibiotics she was able to be discharged  home.  Lithotripsy on the 15th was cancelled and will be set up for  another time when her infection cools off.  She is sent home on a  regular diet and improved ambulatory condition, on antibiotics.  Her  creatinine was 2.0 with the obstruction but came back to normal  levels  with the stent. On the 14th her creatinine was 1.4.  Her urine culture  grew out by [facility] sensitive to everything but nitrofurantoin.  She is  sent home on Cipro 500 mg p.o. b.i.d. for 10 days.      Courtney Paris, M.D.  Electronically Signed     HMK/MEDQ  D:  02/05/2008  T:  02/05/2008  Job:  [id number]

## 2010-11-03 NOTE — Op Note (Signed)
NAME:  Kirsten Walsh, Kirsten Walsh            ACCOUNT NO.:  [id number]   MEDICAL RECORD NO.:  [id number]          PATIENT TYPE:  AMB   LOCATION:  DAY                          FACILITY:  [facility]   PHYSICIAN:  Valetta Fuller, M.D.  DATE OF BIRTH:  [birth year]   DATE OF PROCEDURE:  12/27/2007  DATE OF DISCHARGE:  12/27/2007                               OPERATIVE REPORT   PREOPERATIVE DIAGNOSIS:  Right ureteral calculus with previous history  of urosepsis.   POSTOPERATIVE DIAGNOSIS:  Right ureteral calculus with previous history  of urosepsis.   PROCEDURES PERFORMED:  1. Cystoscopy.  2. Ureteroscopy.  3. Holmium laser lithotripsy with basketing of fragments.   SURGEON:  Valetta Fuller, M.D.   ANESTHESIA:  General.   INDICATIONS:  [name] is a 50 year old female.  She was originally  seen and assessed in our office back on June 11 of this year.  She had  presented with typical right-sided flank pain and a CT in the emergency  room had revealed a 7-mm stone in her proximal ureter.  We discussed  options with her at that time.  The patient elected for a trial of  spontaneous passage.  We decided to go ahead and put her on the schedule  for ureteroscopy with holmium laser lithotripsy.  Unfortunately the  patient presented back to the emergency room with a febrile urinary  tract infection on December 02, 2007.  For that reason the patient had  urgent placement of a double-J stent.  Her febrile infection resolved  and she has continued on antibiotic therapy.  She is doing well  clinically and has now had an indwelling stent for approximately 2  weeks.  She now presents for definitive management of the stone.   TECHNIQUE AND FINDINGS:  The patient was brought to the operating room,  where she had successful induction of general anesthesia.  She was  placed in lithotomy position and prepped and draped in the usual manner.  Cystoscopy revealed the double-J stent to be in good position.  The  stent  was partially removed with a grasper and a guidewire was placed  through the stent up to the right renal pelvis.  The cystoscope was then  removed and the distal ureter was easily engaged without the need for  dilation with the rigid short ureteroscope.  At the junction of the mid  and distal ureter, a rather large 7-8-mm stone was encountered.  We  placed a nitinol basket past the stone, which was then opened to entrap  the stone and prevent any proximal migration.  The guidewire was left in  place as a safety wire.  A 200-micron holmium laser fiber was then  utilized to break up the stone into approximately 10-15 fragments.  The  largest 3-4-mm piece was basket-extracted for stone analysis.  All other  pieces were removed and the patient appeared to be stone-free.  There  was no evidence of any ureteral injury, no evidence of significant  bleeding.  There was no real inflammation, edema or trauma to the ureter  and therefore we did not feel a double-J  stent was necessary especially in the face of the fact that she had had  one in for 2 weeks.  For that reason the guidewire was removed.  Her  bladder was drained and she was brought to the recovery room in stable  condition, having had no obvious complications or problems.           ______________________________  Valetta Fuller, M.D.  Electronically Signed     DSG/MEDQ  D:  12/30/2007  T:  12/30/2007  Job:  [id number]

## 2010-11-05 ENCOUNTER — Encounter: Payer: Self-pay | Admitting: Family Medicine

## 2010-11-05 ENCOUNTER — Ambulatory Visit (INDEPENDENT_AMBULATORY_CARE_PROVIDER_SITE_OTHER): Payer: Medicaid Other | Admitting: Sports Medicine

## 2010-11-05 ENCOUNTER — Ambulatory Visit (INDEPENDENT_AMBULATORY_CARE_PROVIDER_SITE_OTHER): Payer: Medicaid Other | Admitting: Family Medicine

## 2010-11-05 VITALS — BP 163/97

## 2010-11-05 VITALS — BP 149/84 | HR 71 | Temp 98.6°F | Wt 219.0 lb

## 2010-11-05 DIAGNOSIS — M542 Cervicalgia: Secondary | ICD-10-CM | POA: Insufficient documentation

## 2010-11-05 DIAGNOSIS — G43019 Migraine without aura, intractable, without status migrainosus: Secondary | ICD-10-CM

## 2010-11-05 MED ORDER — KETOROLAC TROMETHAMINE 60 MG/2ML IM SOLN
60.0000 mg | Freq: Once | INTRAMUSCULAR | Status: AC
  Administered 2010-11-05: 60 mg via INTRAMUSCULAR

## 2010-11-05 MED ORDER — HYDROCODONE-ACETAMINOPHEN 5-500 MG PO TABS: 1.0000 | ORAL_TABLET | Freq: Every day | ORAL | Status: DC

## 2010-11-05 MED ORDER — GABAPENTIN 300 MG PO CAPS: 300.0000 mg | ORAL_CAPSULE | Freq: Two times a day (BID) | ORAL | Status: DC

## 2010-11-05 MED ORDER — TRAMADOL HCL 50 MG PO TABS: 50.0000 mg | ORAL_TABLET | Freq: Three times a day (TID) | ORAL | Status: DC | PRN

## 2010-11-05 NOTE — Progress Notes (Signed)
  Subjective:    Patient ID: Kirsten Walsh, female    DOB: 11-27-1960, 50 y.o.   MRN: [id number]  HPI Here for f/u of chronic pain  Reports continues right sided body pain.  Worse in right calf and right neck.  Was started on Cymbalta 3-4 weeks ago with minimal improvement.  Pain described as "swelling and burning" sensation pointing to right side of face down to right great toe.  No weakness but notes parasthesias throughout right side.  Most difficulty is with neck tightness when lifting right arm and turning to the left.    Review of Systemssee HPI     Objective:   Physical Exam    Neck: positive spurling's Limited neck range of motion with neck pain on left lateral head turn Grip strength and sensation normal in bilateral hands Strength good C4 to T1 distribution with giving way at times. Muscle tightness over right trapezius No sensory change to C4 to T1 Reflexes normal  Knee: Normal to inspection with no erythema or effusion or obvious bony abnormalities. Palpation normal with no warmth or joint line tenderness or patellar tenderness or condyle tenderness. ROM normal in flexion and extension and lower leg rotation. Non painful patellar compression.  Calf:  Pain on palpation, no swelling/edema.  Review of MRI 2011 There is disk bulging in cervical spine at levels C3 to C6 There is some edema in cord and relative stenosis with narrowing and loss of ant clear space from  C3 to C6 on both T1 and T2 images    Assessment & Plan:

## 2010-11-05 NOTE — Assessment & Plan Note (Signed)
Chronic in nature, some psychological component, pt did allow some myofacial techniques today and did respond to some trigger point injection with lidocaine after verbal consent prepped with alcohol swab. <2cc used in 3 trigger points in right trapezius. Return in 1 month. Also started neurontin will titrate up.

## 2010-11-05 NOTE — Patient Instructions (Signed)
Good to see you again I hope what we did today will help I will start neuronin.  Take 1 pill nightly for the next week then one at night and one in morning after that until you see Dr. Lelon Perla I am going to get a sleep study as well.  We will call you with the appointment.  I want to see you again in 1 month for more manipulation if you think it helped.

## 2010-11-05 NOTE — Assessment & Plan Note (Signed)
MRI from Sept 2011 reviewed.  Mild cervical disk bulging, unclear if this is significant enough to cause symptoms.  Patient encouraged to continue manipulation with Dr. Katrinka Blazing if this helps, continue Cymbalta for at least 6 weeks.  If not effective, would consider titration of gabapentin up to 600 mg tid.  (300 mg qhs,then 300 mg bid,then 300 mg tid,then  300 mg qam + 300 mq midday + 600 QHS, ...)    Will forward this to her recent providers Drs Lelon Perla, Annice Pih.  Encourage patient to schedule follow-up appointment with PCP to discuss improvements on cymbalta and changing to gabapentin if needed.

## 2010-11-05 NOTE — Assessment & Plan Note (Signed)
Pt has had multiple different treatments over time, will get sleep study though with hx of sleep problems.  When asked as well pt does snore, is mildly overweight as well and hypertensive.
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
## 2010-11-05 NOTE — Assessment & Plan Note (Signed)
pt did allow some myofacial techniques today and did respond to some trigger point injection with lidocaine after verbal consent prepped with alcohol swab. <2cc used in 3 trigger points in right trapezius.  Pt to follow up again in 1 month. Given nuerontin and given vicodin as needed. Would not give narcotics long term.

## 2010-11-06 NOTE — Discharge Summary (Signed)
NAME:  Kirsten Walsh, Kirsten Walsh            ACCOUNT NO.:  [id number]   MEDICAL RECORD NO.:  [id number]          PATIENT TYPE:  INP   LOCATION:  3037                         FACILITY:  [facility]   PHYSICIAN:  Leighton Roach McDiarmid, M.D.DATE OF BIRTH:  02-Mar-1961   DATE OF ADMISSION:  11/22/2008  DATE OF DISCHARGE:  11/26/2008                               DISCHARGE SUMMARY   DISCHARGE DIAGNOSES:  1. Status migrainosus.  2. Hypertension.   DISCHARGE MEDICATIONS:  1. Hyzaar 50/12.5 one by mouth daily.  2. Inderal long-acting 160 mg by mouth daily.  3. Zonegran 300 mg by mouth at bedtime.  4. Flonase 2 sprays in each nostril twice daily.  5. Percocet 5/325 two tablets twice daily as needed for pain.  6. Zofran 4 mg by mouth every 6 hours needed for nausea.  7. Protonix 40 mg by mouth daily.   IMAGING:  None.   LABORATORY DATA:  Upon discharge, sodium 140, potassium 4.1, chloride  109, CO2 of 25, glucose 162, BUN 14, creatinine 1.03, and calcium 8.9.  TSH was 1.303.  Magnesium 2.1.  LFTs were within normal limits.  Albumin  3.8.  CBC within normal limits.   BRIEF HOSPITAL COURSE:  Kirsten Walsh is a 50 year old African American  female who was admitted for status migrainosus but felt outpatient  treatment.  1. Status migrainosus.  The patient presented with intractable      migraine, was admitted for administration of DHE protocol.  After      the protocol, the patient received DHE 1 mg IV q.8 h, Reglan 10 mg      IV 30 minutes prior to DHE, and methylprednisolone 100 mg IV  q.8 h      with each DHE.  She was given this protocol times three and half      days with vast improvement of her migraine.  During this time, she      was also treated with maintenance IV fluid and given her home      medications.  With improvement of her migraine to a subjective pain      score of 5/10, she was discharged with home medications as well as      Percocet 5/325, take 2 tablets twice daily as needed for pain.   Please note that the patient was happy with improvement of her      headache and was requesting discharge.  2. Hypertension.  The patient has a history of hypertension.  She was      maintained on her home medications as she did have some elevated      blood pressures during her hospitalization possibly reflecting pain      level but also side effects of her DHE protocol.  These high ranges      included 2 systolic blood pressures at 170 and 170 were treated      with hydralazine which brought her pressures down very well.  The      patient had no signs or symptoms of strain at that time, denying      chest pain, shortness of breath, vision changes, or  dizziness.   FOLLOWUP:  The patient was instructed to follow up with her PCP in 1  week.   FOLLOWUP ISSUES:  1. Monitor for resolution of headache.  2. Monitor blood pressure.      Helane Rima, MD  Electronically Signed      Leighton Roach McDiarmid, M.D.  Electronically Signed    EW/MEDQ  D:  11/28/2008  T:  11/29/2008  Job:  [id number]

## 2010-11-06 NOTE — H&P (Signed)
NAME:  Kirsten Walsh, Kirsten Walsh NO.:  [id number]   MEDICAL RECORD NO.:  [id number]                   PATIENT TYPE:  INP   LOCATION:  2032                                 FACILITY:  [facility]   PHYSICIAN:  Leighton Roach McDiarmid, M.D.             DATE OF BIRTH:  Dec 22, 1960   DATE OF ADMISSION:  12/04/2002  DATE OF DISCHARGE:                                HISTORY & PHYSICAL   HISTORY OF PRESENT ILLNESS:  This is a 50 year old African-American female  with a history of migraine headaches who presents to the [facility] today after having a presyncopal/possibly syncopal event in  the parking lot of [facility]. The patient was coming to the  clinic today secondary to a persistent severe headache (the patient has been  seen in the clinic five times in the last week and a half for persistent  migraine and has received Toradol, Demerol, and various other oral agents  for her headaches). While walking to the clinic, after having driven herself  here, she got light-headed and fell. She does not believe that she lost  consciousness, although cannot quite remember. No bowel or bladder loss. No  seizure-like activities. The episode was witnessed and when the person  standing by the patient went to her assistance she was conscious. The  patient reports having had one other similar episode about six weeks ago  while at work, also associated with a migraine headache.   REVIEW OF SYMPTOMS:  No chest pain, no shortness of breath. She does have  some nausea. Psychiatric:  She denies depression, denies any recent out of  the ordinary stressors in her life.   PAST MEDICAL HISTORY:  1. Migraine headache. The patient recently had a normal CT in February 2004     and a normal MRI in 11/2002.  2. Hypertension.  3. Obesity.  4. Allergic rhinitis.  5. History of herpes simplex type 2.   PAST SURGICAL HISTORY:  Cholecystectomy secondary to  cholecystitis and  pancreatitis in January 2004.   SOCIAL HISTORY:  The patient is married, has three children. The youngest  child is 71 old and still breastfeeding. The patient works at  [employer]. No history of smoking or alcohol abuse or  substance abuse.   MEDICATIONS:  1. Atenolol 100 mg b.i.d.  2. Depakote 1 tab b.i.d.  3. Claritin p.r.n.  4. Percocet p.r.n. severe headaches.  5. Amitriptyline p.r.n. severe headache.   ALLERGIES:  Aspirin.   FAMILY HISTORY:  Father with diabetes mellitus and hypertension. Mother with  migraines.   PHYSICAL EXAMINATION:  VITAL SIGNS:  The patient was 190/120 on initial  presentation to the clinic. This reduced to 170/120.  GENERAL:  The patient was somewhat ill-appearing holding head when I saw  her. Judgment seemed to be normal.  HEENT:  Normocephalic, atraumatic. No signs of trauma.  PULMONARY:  Clear to  auscultation bilaterally.  CARDIOVASCULAR:  Regular rate and rhythm without murmur.  ABDOMEN:  Slightly obese, nontender, no guarding.  NEUROLOGIC:  Cranial nerves 2-12 are intact. Pupils equal and reactive to  light. Normal visual tracking. The cranial nerves are intact. The patient's  strength was 5/5 grip bilateral to 5/5 flexion and extension at the elbows  bilaterally. Hip, knee, and ankle flexion were all 5/5 bilaterally as well  as knee and ankle extension 5/5. The patient had a normal finger-to-nose  bilaterally.   LABORATORY DATA:  Labs in the clinic white count 3.2 with 40% lymphocytes,  41% granulocytes. Hemoglobin 13.3, hematocrit 40.6, platelets 192. CBG 98.  EKG showed normal sinus rhythm with no ST segment elevations or depressions.   ASSESSMENT AND PLAN:  This is a 50 year old female with a history of  migraine supersensory, intractable migraine and what sounds more like a  presyncopal event in the parking lot. This is the patient's sixth visit to  the family practice center in less than two  weeks and I feel she needs to be  admitted and have a neurology consult. The presyncopal syncopal episode is  of unclear etiology but is not suggestive of seizure and seems unlikely to  be cardiac in origin. I believe it is likely secondary to pain.   Problem 1.  Intractable migraine. Compazine 10 mg IM given in clinic. We  will ask neurology to see her. She has had a recent normal MRI and CT. The  possibility of pseudo tremor cerebri has crossed my mind. If the headache  remains intractable an LP to assess her pressure might be helpful.   Problem 2.  Presyncopal/syncopal episode. No evidence of seizure activity.  Normal EKG so this is most likely related to pain. If it recurs may require  further cardiac workup.   Problem 3.  Hypertension. Quite high today. Given Atenolol 100 mg b.i.d. The  patient's blood pressure has been creeping up over the last two to three  months, although usually her systolics are 150-160. I believe this is  central hypertension, but I do wonder if there is any connection with the  headaches.     Bradly Bienenstock, M.D.                         Etta Grandchild, M.D.    Arliss Journey  D:  12/04/2002  T:  12/04/2002  Job:  [id number]

## 2010-11-06 NOTE — Consult Note (Signed)
NAME:  Kirsten Walsh, Kirsten Walsh NO.:  [id number]   MEDICAL RECORD NO.:  [id number]                   PATIENT TYPE:  INP   LOCATION:  2032                                 FACILITY:  [facility]   PHYSICIAN:  Marlan Palau, M.D.               DATE OF BIRTH:  December 02, 1960   DATE OF CONSULTATION:  12/04/2002  DATE OF DISCHARGE:                                   CONSULTATION   HISTORY OF PRESENT ILLNESS:  Kirsten Walsh is a 50 year old, right-  handed, black female born on 1961-05-28, with a history of migraine  headaches dating back to her teenage years.  The patient has a prominent  history of migraine as well with migraine in a son, a sister, and mother.  The patient notes that her headaches have become a bit more frequent and  severe since the delivery of her last child in November of 2003.  The  patient will have at least one headache a month lasting on average two to  three days.  The patient has had daily headaches over the last nine days,  but have become quite severe over the last several days.  The patient has  nausea, vomiting, photophobia, pain that is mainly present in the bifrontal  area.  She now has a cap-like feeling on the vertex of the head and spots in  the eyes.  No visual loss.  No numbness or weakness of the face, arms, or  legs.  The patient had a near syncopal event associated with severe  headache.  MRI of the brain done on November 30, 2002, was unremarkable.  The  patient is brought into the hospital for treatment of status migrainosus at  this time.   PAST MEDICAL HISTORY:  Significant for:  1. Intractable migraine.  2. Status post gallbladder resection in the past.  3. History of ileus in the past.  4. History of obesity.   MEDICATIONS PRIOR TO ADMISSION:  1. Atenolol 100 mg twice a day.  2. Depakote 250 mg one twice a day.   ALLERGIES:  The patient has an allergy to ASPIRIN that causes nausea and  vomiting.   HABITS:  She  does not smoke.  She drinks alcohol on occasion.   SOCIAL HISTORY:  The patient is married.  She lives in the [place], [place]  [place], area.  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  She has  three children who are alive and well.   FAMILY HISTORY:  Notable for problem with migraine in her mother.  Her  father is alive and well.  The patient has three sisters and one brother.  One sister with migraine.  Otherwise the family history is notable for  diabetes in grandparents on both sides of the family.   REVIEW OF SYSTEMS:  Notable for some low-grade fevers.  The patient had some  sinus drainage, although MRI  of the brain does not show sinus problems.  The  patient denies any shortness of breath and chest pains.  She does have some  nausea and vomiting.  She denies any problems with bowels or bladder.  Denies any problems with dizziness.   PHYSICAL EXAMINATION:  VITAL SIGNS:  The blood pressure currently is 148/80,  heart rate 60, respiratory rate 20, and temperature afebrile.  GENERAL APPEARANCE:  This patient is a minimally to moderately obese black  female who is alert and cooperative at the time of the examination.  HEENT:  Head:  Atraumatic.  Eyes:  Pupils are equal, round, and react to  light.  Disks are flat bilaterally.  NECK:  Supple.  No carotid bruits noted.  RESPIRATORY:  Clear.  CARDIOVASCULAR:  Regular rate and rhythm.  No obvious murmurs or rubs noted.  EXTREMITIES:  Without significant edema.  NEUROLOGIC:  Cranial nerves as above.  Facial symmetry is present.  The  patient has good sensation of the face to pinprick and soft touch  bilaterally.  She has good strength of the facial muscles and muscles of  head turning and shoulder shrug bilaterally.  Speech is well enunciated and  not aphasic.  Motor testing reveals 5/5 strength in all fours.  Good  symmetric motor tone is noted throughout.  Sensory testing is intact to  pinprick, soft touch, and vibratory  sensation throughout.  The patient has  good finger-nose-finger and toe-to-finger bilaterally.  The patient was not  ambulated.  Deep tendon reflexes remain symmetric and normal.  The toes are  neutral bilaterally.  No crepitus in the temporomandibular is noted.   LABORATORY DATA:  Laboratory values notable for white count 3.2, hemoglobin  13.3, hematocrit 40.6, and platelets 192.  The EKG reveals normal sinus  rhythm and heart rate 70.   IMPRESSION:  History of intractable migraine.   This patient has severe and prolonged migraine at this point.  We will  consider initiation of DHE45 protocol.  Would recommend placing the patient  on DHE 1 mg q.8h. x 9 doses.  May abort the protocol if the headache abates.  Would give Reglan 10 mg one-half hour prior to the [facility] and given  methylprednisolone 100 mg IV q.8h. for nine doses as well.  Analgesia for  pain.  Will follow the patient's clinical course while in house.                                               Marlan Palau, M.D.    CKW/MEDQ  D:  12/04/2002  T:  12/04/2002  Job:  [id number]   cc:   Leighton Roach McDiarmid, M.D.  [address] [place]  [place] [address]  Fax: [phone]   [facility]  [address]  [address]

## 2010-11-06 NOTE — Consult Note (Signed)
NAME:  Kirsten Walsh, Kirsten Walsh                      ACCOUNT NO.:  [id number]   MEDICAL RECORD NO.:  [id number]                   PATIENT TYPE:  INP   LOCATION:  6709                                 FACILITY:  [facility]   PHYSICIAN:  Velora Heckler, M.D.                DATE OF BIRTH:  12-27-1960   DATE OF CONSULTATION:  DATE OF DISCHARGE:                                   CONSULTATION   REASON FOR CONSULTATION:  Abdominal  pain, cholelithiasis, pancreatitis.   HISTORY OF PRESENT ILLNESS:  The patient is a 50 year old black female  admitted to the [facility] Teaching Service with several day history of  abdominal  pain, nausea and vomiting.  The patient has had intermittent  episodes of abdominal  pain dating to 11/03 at the time of delivery of her  third child.  The patient had been followed in the [facility].  Two days prior to admission, she developed epigastric abdominal  pain  radiating to the back associated with  nausea and vomiting.  She noted  diarrhea.  She denied fevers and chills.  She denies any history of jaundice  of acholic stools.  Patient  was [facility] and  laboratory studies and ultrasound of the abdomeh was ordered.  Ultrasound  demonstrates multiple gallstones.  There was a sonographic  Murphy sign.  There was thickening of the gallbladder wall felt to be consistent with  acute cholecystitis. Laboratory studies showed an elevated alkaline  phosphatase of 177,  elevated liver transaminases and an elevated serum  lipase level of 840.  The patient is now admitted on the [facility]  Teaching Service for management of cholecystitis and biliary pancreatitis.   REVIEW OF SYSTEMS:  The patient  denies any history of jaundice or acholic  stools.  She denies any dysuria.  She has had no prior history of  hepatobiliary or pancreatic disease.  The patient  denies any family history  of hepatobiliary pancreatic disease.   PAST MEDICAL  HISTORY:  History of migraine headache, history of child birth  x3, history of cholelithiasis, history of gastroesophageal reflux.   MEDICATIONS:  1. Claritin p.r.n.  2. Flonase p.r.n.  3. Multivitamins.  4. Nexium 40 mg daily.  5. Percocet for severe headache p.r.n.   ALLERGIES:  ASPIRIN.   SOCIAL HISTORY:  The patient works as a Social worker.  She has three  children.  She denies tobacco use.  She occasionally has an alcoholic  beverage.   FAMILY HISTORY:  Notable for hypertension and diabetes in the patient's  father.   PHYSICAL EXAMINATION:  GENERAL:  A 50 year old well-developed, well-  nourished black female in no acute distress.  VITAL SIGNS:  Temperature 97.8, pulse 68, respirations 20, blood pressure  148/98.  HEENT:  Normocephalic, atraumatic.  Sclerae are clear.  Dentition is good.  Voice quality is normal.  Anterior examination of the neck  shows it to be  symmetric.  Palpation shows no thyroid nodularity.  There is no anterior or  posterior or cervical adenopathy.  There are no supraclavicular masses.  LUNGS:  Clear to auscultation.  CARDIAC:  Regular rate and rhythm without murmur.  ABDOMEN:  Soft.  Bowel sounds are present on auscultation.  There is  tenderness to palpation in the epigastrium and right upper quadrant.  There  is voluntary guarding.  There is no rebound tenderness. There are no  palpable masses.  There is no hepatosplenomegaly.  There is mild right flank  tenderness.  There is no sign of hernia.  EXTREMITIES:  Nontender without edema.  NEUROLOGIC:  The patient is alert and oriented without focal neurologic  deficit.   DATA REVIEWED:  Ultrasound of the abdomen demonstrating  multiple  gallstones, sonographic Murphy sign and wall thickening felt to be  consistent with acute cholecystitis.  Laboratory studies show a white count  3.4, hemoglobin 12.4, hematocrit 40%, platelet count 210,000.  Metabolic  profile notable for an elevated alkaline  phosphatase of 177 and AST of 46,  ALT 288, lipase 840.   DATA ORDERED:  CBC, complete metabolic profile and serum amylase and lipase  levels in a.m. 06/29/02.   DIAGNOSES:  1. Biliary pancreatitis.  2. Probable choledocholithiasis.  3. Cholelithiasis.  4. Subacute cholecystitis.   PLAN:  1. NPO except ice chips.  2. Intravenous hydration.  3. Control of pain and nausea.  4. Repeat laboratory studies in a.m. 06/29/02.  5. If clinically improved and laboratory studies show improvement in liver     function tests and pancreatic enzymes, we will post the patient in two to     three days for laparoscopic cholecystectomy with intraoperative     cholangiography.  If, however, the patient's clinical course deteriorates     or laboratory studies fail to improve significantly, we will contact     gastroenterology for consultation for possible ERCP and stone extraction.  6. Discussed nature of laparoscopic cholecystectomy and intraoperative     cholangiography with the patient at length. We discussed potential     complications including need for conversion to an open procedure.  They     understand and wish to proceed when clinically advisable.                                               Velora Heckler, M.D.    TMG/MEDQ  D:  06/28/2002  T:  06/29/2002  Job:  [id number]

## 2010-11-06 NOTE — Discharge Summary (Signed)
NAME:  Kirsten Walsh, Kirsten Walsh NO.:  [id number]   MEDICAL RECORD NO.:  [id number]          PATIENT TYPE:  OBV   LOCATION:  3025                         FACILITY:  [facility]   PHYSICIAN:  Asencion Partridge, M.D.     DATE OF BIRTH:  [birth year]   DATE OF ADMISSION:  06/22/2004  DATE OF DISCHARGE:  06/26/2004                                 DISCHARGE SUMMARY   ANTICIPATED DATE OF DISCHARGE:  June 26, 2004.   ADMISSION DIAGNOSES:  1.  Status migrainous.  2.  Dehydration.  3.  Hypertension.  4.  Diarrhea.  5.  Allergic rhinitis.   DISCHARGE MEDICATIONS:  1.  Chlorpromazine 50 mg p.o. up to t.i.d.  2.  Phenergan suppositories (25 mg) two q.6h. p.r.n. nausea and vomiting.  3.  HCTZ 25 mg p.o. daily.  4.  Zyrtec 10 mg p.o. p.r.n. allergies.  5.  Topamax 200 mg p.o. q.h.s. p.r.n. headaches.  6.  Flonase two nasal sprays in each nostril daily.   SIGNIFICANT PHYSICAL FINDINGS:  GENERAL:  Kirsten Walsh is a 50 year old  African-American female with daily headaches since February 2005.  She  presented at the ER with worsenng of the migraine headaches and right ear  pain for four days.  She described the pain as sharp, localized on the right  side of the head.  Intensity of the pain is 10.  The patient went to the ER  two days ago and she was given Dilaudid, and Phenergan with no relief, but  the patient was sent home.  On January 2 the patient was given Demerol and  Phenergan x1 with no relief, but the patient was also complaining of nausea,  vomiting, and diarrhea.  Given the fact that the headache was not relieved  with medication, and the patient was not able to keep fluids down secondary  to vomiting and diarrhea, the patient was admitted for treatment and  observation.   REVIEW OF SYSTEMS:  CONSTITUTIONAL:  Positive light-headedness.  No fever.  CARDIOVASCULAR SYSTEM:  Unremarkable.  RESPIRATORY SYSTEM:  Within normal  limits.  GASTROINTESTINAL:  Positive for diarrhea x3, no  blood, no mucus.  Positive nausea and vomiting.  NEUROLOGIC:  Positive photophobia, positive  phonophobia.  EYES:  Positive floaters.  ENT:  Right ear pain.  The  remainder of review of systems was noncontributory.   PHYSICAL EXAMINATION:  VITAL SIGNS:  Temperature 97.9, respiratory rate 20,  pulse 74, blood pressure 137/83, O2 saturations 99% on room air.  GENERAL:  The patient was lying on a stretcher with pillow over the face,  NAV, comfortable.  The patient was oriented x3.  HEENT:  Normocephalic, atraumatic.  Ears:  TMs clear bilaterally.  Throat  within normal limits.  Nose unremarkable.  NECK:  Supple, no masses.  CARDIOVASCULAR:  Within normal limits.  RESPIRATORY:  Clear to auscultation.  MUSCULOSKELETAL:  Muscular strength 5/5 in upper and lower extremities  bilaterally.  EXTREMITIES:  Trace LE edema bilaterally.  NEUROLOGIC:  Cranial nerves II-XII intact.  Sensation intact, no weakness.  DTRs, class II in upper and lower extremities bilaterally.   HOSPITAL COURSE:  1.  Status migrainous.  Since the patient had no relief with Demerol and      Phenergan, during the last two previous ER visits, she was admitted for      24 hours observation was started on Dilaudid PCA, Phenergan, and Zofran.      Neurologic physical examination was unremarkable, therefore, there was      no need for imaging.  On January 3 the patient was feeling better and      the headache intensity came down from 10 on the day of admission to 8.      The patient was still nauseated, but decreased vomiting.  The patient      presented no neurologic deficits.  The patient mentioned that she was      placed on VHE-45 protocol previously with results.  When I asked to      compare the results of DHE-45 versus Dilaudid PCA, the patient mentioned      that she gets the same pain relief with both medications.  She also      stated that she would prefer the DHE-45 protocol since she does not have      to remember  to get the medication.  On January 4 the patient had a good      night's sleep, had much improvement on her headache.  Intensity of the      headache was about 7.  During the morning the patient's IV got      infiltrated and IV team was unable to place a new one.  The patient was      not on any medication for a couple of hours, even though she was going      to be discharged home, we were unable to do this.  The patient was not      comfortable to going back home and her headache increased in intensity.      I spoke with Dr[facility] regarding this patient.      She explained that every time that Kirsten Walsh has these migrainous      episodes she was unable to break them down.  Dr. Neale Burly strongly      advised against using narcotics for this pain treatment.  Viewing the      chronic condition of this patient, Dr. Neale Burly advised to place this      patient on DHE-45 protocol.  Since IV team was unable to place an IV on      this patient we prescribed DHE-45 protocol SZ.  On January 5 this      patient felt better.  The patient was very upset because she felt that      family practice teaching service was trying to send her back home      without doing our best to treat her headaches.  Dr. Morrie Sheldon and myself      explained to this patient that we have tried our best to treat her      headaches, and we have followed Dr. Onnie Boer advice for her management.      On January 5 the DHE-45 protocol was completed, and the patient was also      placed on Thorazine 50 mg p.o. p.r.n. up to t.i.d.  During the afternoon      the patient felt much better.  I had a long conversation with this      patient explaining that we felt we  had done our best to treat her      condition.  This time the patient seems to understand.  The patient      feels comfortable going back home.  I have arranged a follow-up      appointment with Dr. Shawnee Knapp [facility] on January 6 at     10:30  a.m.  2.  Dehydration.  Most likely secondary to nausea, vomiting, and diarrhea.      The patient was placed on NS bolus, then D5 half NS, plus 20 mEq      potassium at 200 cc per hour, and sips of clears.  On January 3 the      patient was feeling much better.  __________ level at 38, NA 134,      potassium 4.6, CL 108, CO2 22, BUN 4, CR 0.8, glucose 94.  CA 7.9.      Value BS __________ 3.1, hemoglobin 12.3, hematocrit 36.7, platelets 87.      Vomiting has decreased, diarrhea decreased, the patient was hungry but      still nauseated.  Diet was advanced as tolerated.  Diarrhea has      improved.  On January 4 the patient was tolerating p.o. intake.  Nausea      still persisted.  We discontinued IV fluids secondary to good p.o.      intake.  The patient was well hydrated.  At discharge the patient was      tolerating p.o. intakes, hemodynamically stable.  3.  Diarrhea.  Maybe infectious.  The patient denied recent antibiotics or      travel.  We considered stool studies if this diarrhea did not resolve in      24 hours.  By January 3 the diarrhea resolved spontaneously.  No relapse      during the days of hospitalization.  The patient is stable.  No further      studies needed.  4.  Hypertension.  The patient was stable during the days of hospitalization      on HCTZ 25 mg p.o. daily.  She had systolic  blood pressure range      between 113-137, diastolic blood pressure ranged between 52-83 mmHg.  5.  Allergic rhinitis.  During the days of hospitalization the patient was      stable and continued to take Flonase and Zyrtec.   DISPOSITION:  Discharge to home.   FOLLOW UP:  Dr. Shawnee Knapp at [facility] on January 6 at 10:30 a.m.  The patient will schedule a follow-up appointment with primary physician,  Dr. Dahlia Byes as needed.       FIM/MEDQ  D:  06/25/2004  T:  06/25/2004  Job:  [id number]

## 2010-11-06 NOTE — Discharge Summary (Signed)
   NAME:  Kirsten Walsh, Kirsten Walsh                      ACCOUNT NO.:  [id number]   MEDICAL RECORD NO.:  [id number]                   PATIENT TYPE:  INP   LOCATION:  5019                                 FACILITY:  [facility]   PHYSICIAN:  Pearlean Brownie, M.D.            DATE OF BIRTH:  November 22, 1960   DATE OF ADMISSION:  06/28/2002  DATE OF DISCHARGE:  07/02/2002                                 DISCHARGE SUMMARY   DISCHARGE DIAGNOSES:  1. Cholecystitis.  2. Biliary pancreatitis.  3. Soft tissue injury, left lower extremity.   DISCHARGE MEDICATIONS:  Vicodin one to two tabs p.o. q.4h. p.r.n. pain,  dispense #15.   PROCEDURES:  1. June 28, 2002 ultrasound, abdominal which revealed gallstones, positive     Murphy's sign, wall thickening consistent with acute cholecystitis.  2. June 29, 2002 left lower extremity two view x-ray:  No fractures.  3. June 30, 2002 laparoscopic cholecystectomy with no complications.   SUMMARY:  A 50 year old female, six weeks postpartum (no complications) who  reported to her primary Kirsten Walsh with complaints of nausea, vomiting and  diarrhea times four days, abdominal pain x 2 days worse with eating.  She  was sent for ultrasound listed above and admitted to [name].   Admission labs were notable for AST of 486, ALT 288, lipase 840.  Please see  admission history and physical for complete history and evaluation.   HOSPITAL COURSE:  #1 - CHOLECYSTITIS:  The patient was seen by general  surgery and underwent laparoscopic cholecystectomy without complication.   #2 - BILIARY PANCREATITIS:  Resolved with IV fluid hydration, bowel rest and  cholecystectomy.   #3 - LEFT LOWER EXTREMITY SOFT TISSUE INJURY:  The patient reported that she  fell down the stairs three to four weeks ago and since then had been  experiencing left lower extremity edema and pain.  Radiologic films were  obtained during this hospitalization and showed no  fracture.  The patient  ambulating without discomfort at discharge.   DISPOSITION:  Discharged to home.   DISCHARGE INSTRUCTIONS:  Keep incisions clean and dry.  Wound care as  instructed by surgery.   DIET:  Regular diet.    FOLLOW UP:  Dr. [name] in three weeks.  Call for appointment.  [facility], Dr. [name], July 13, 2002 at 1:55 p.m.     [name], M.D.                      Pearlean Brownie, M.D.    [name]  D:  07/02/2002  T:  07/02/2002  Job:  [id number]   cc:   Bradly Bienenstock, M.D.  [facility] Resident  [place], [place] [address]  Fax: [phone]

## 2010-11-06 NOTE — Discharge Summary (Signed)
NAME:  Kirsten Walsh, Kirsten Walsh                      ACCOUNT NO.:  [id number]   MEDICAL RECORD NO.:  [id number]                   PATIENT TYPE:  INP   LOCATION:  1610                                 FACILITY:  [facility]   PHYSICIAN:  Leighton Roach McDiarmid, M.D.             DATE OF BIRTH:  08/31/1960   DATE OF ADMISSION:  07/25/2003  DATE OF DISCHARGE:  07/30/2003                                 DISCHARGE SUMMARY   ATTENDING PHYSICIAN:  Leighton Roach McDiarmid, M.D.   DISCHARGE DIAGNOSES:  1. Status migrainous.  2. Hypertension.  3. Constipation.   PROCEDURE:  None.   CONSULTS:  None.   HISTORY OF PRESENT ILLNESS:  Kirsten Walsh is a 50 year old African-American  female with a history of migraines and hypertension who presented to the  [facility] walk-in clinic due to a three-week history of intractable  migraine.  The pain was right-sided and retro-orbital.  She did have  photophobia and nausea and vomiting.  She was given Phenergan and Demerol,  which only mildly relieved the pain, and she was admitted for pain control  and to help break the migraine.   PHYSICAL EXAMINATION:  VITAL SIGNS:  Blood pressure 170/110 on admission,  temperature 99.1, heart rate 88.  The remainder of the physical exam was  benign.  NEUROLOGIC:  There were no focal neurological deficits.  Deep tendon  reflexes were normal.  Strength was 5/5.  Gait was without ataxia.  The  patient had normal cerebellar function.   Please see admission H&P for full admission details.   HOSPITAL COURSE:  Problem #1:  Status migrainous:  As stated above, the  patient was eventually started with Demerol and Phenergan, which provided  virtually no relief.  The patient was then started on a Dilaudid PCA and was  continued on her propranolol for prophylaxis and started on Topamax;  however, following two days of this therapy, the patient received very  minimal relief of her symptoms.  The patient was then started on a D.H.E.  protocol that  lasted for nine doses, that included Decadron, Reglan, and  D.H.E.  This protocol included the day prior to discharge.  The patient was  doing better; however, was still having some mild nausea and vomiting with  food; however, the D.H.E. protocol did help relieve the patient's symptoms.  Of note, the patient continues to breast-feed, despite the fact that the  youngest child is over 40 months old.  It was explained to the patient that  this limits our options regarding migraine prophylaxis and abortive  medicines.  The patient was counseled regarding discontinuation of the  breast-feeding; however, decision was made at the time of discharge that  this will need to be addressed at followup.  Also of note, the patient's  Norvasc and Inderal were discontinued while in the hospital.  Problem #2:  Hypertension:  Following admission with pain control, the  patient's blood pressure was well controlled  and normalized.  This also  continued despite the discontinuation of the Inderal.  Problem #3:  Constipation:  The patient did not have a bowel movement by the  time of dictation; however, the day prior to discharge, the patient was  given magnesium citrate, and on the day of discharge, the patient was given  Senokot as well as MiraLax to help relieve bowel movement.  The patient was  instructed to call the [facility] if she had not had  a bowel movement the following day so that she could receive possibly more  MiraLax or another laxative.   DISCHARGE DESTINATION:  The patient was discharged home.   DISCHARGE MEDICATIONS:  1. Valproic acid 250 mg tab.  Patient is to take 1-2 tabs twice daily with     food everyday for migraine prophylaxis.  2. Migranal 0.5 mg nasal spray 1 spray each nostril at the first sign of a     migraine.  May repeat in 15 minutes.  Patient was instructed not to     exceed six sprays in 24 hours.  3. Hydrochlorothiazide 25 mg p.o. q.d. for  hypertension.   As stated above, Norvasc and Inderal were discontinued.   SPECIAL INSTRUCTIONS:  The patient was instructed that she should consider  discontinuing her breast-feeding.  In addition, she was instructed that she  could not use Migranal while breast-feeding and that she should pump and  then discard the milk.   FOLLOW-UP APPOINTMENTS:  1. A followup was [facility] on February     15th at 11:30 a.m.  The patient was instructed to call the clinic on the     Friday before the appointment to confirm.  2. [facility] with Dr. Dahlia Byes.  Patient was instructed     to call to schedule an appointment in four weeks.   SUGGESTED FOLLOW-UP ITEMS:  1. The patient's blood pressure will need monitoring, given that she is no     longer on propranolol for her migraine prophylaxis.  This may cause some     rebound hypertension.  2. The patient should have continued counseling regarding discontinuation of     her breast-feeding.      Franchot Mimes, MD                         Etta Grandchild, M.D.    TV/MEDQ  D:  07/30/2003  T:  07/30/2003  Job:  [id number]   cc:   Georgina Peer, M.D.  [address] [place], [place] [address]  Fax: [phone]   Santiago Glad  [address]  [place]  [place] [address]  Fax: [phone]

## 2010-11-06 NOTE — Discharge Summary (Signed)
NAME:  Kirsten Walsh, MARRIN NO.:  [id number]   MEDICAL RECORD NO.:  [id number]          PATIENT TYPE:  OBV   LOCATION:  3025                         FACILITY:  [facility]   PHYSICIAN:  Asencion Partridge, M.D.     DATE OF BIRTH:  Jun 02, 1961   DATE OF ADMISSION:  06/22/2004  DATE OF DISCHARGE:  06/26/2004                                 DISCHARGE SUMMARY   ADDENDUM:  The patient was discharged home yesterday on 06/25/04.  After a 20-25 minute  discussion with the patient she agreed to be discharged home.  When the  nurse went to the room with the discharge instructions and prescription, the  patient stated that she misunderstood the M.D., and she does not want to go  home since it is too late to go home.  The patient stated that she preferred  to spend the night and go home the first thing on the next morning.  The  discharge was deferred to 1/6 during the a.m.   Overnight the patient requested Ativan 10 mg for sleeping pill.  The patient  received Thorazine 50 mg t.i.d. for headaches.  On 1/6, the patient's vital  signs are normal.  Physical examination is unremarkable.  The patient is  discharged home with a followup appointment with Dr. Shaw[facility]app [facility] on 1/6 at 10:30 a.m.  I have spoken with the patient and she was  comfortable with the idea of leaving the hospital this morning.  The patient  will attend the appointment with Dr. Shawnee Knapp and will call her primary care  physician for a followup at the [facility].       FIM/MEDQ  D:  06/26/2004  T:  06/26/2004  Job:  [id number]   cc:   Shawnee Knapp, M.D.

## 2010-11-06 NOTE — Discharge Summary (Signed)
NAME:  Kirsten Walsh, Kirsten Walsh                      ACCOUNT NO.:  [id number]   MEDICAL RECORD NO.:  [id number]                   PATIENT TYPE:  INP   LOCATION:  5033                                 FACILITY:  [facility]   PHYSICIAN:  Franchot Mimes, MD                   DATE OF BIRTH:  09-17-1960   DATE OF ADMISSION:  07/25/2003  DATE OF DISCHARGE:  08/01/2003                                 DISCHARGE SUMMARY   Please see prior discharge summary for details of admission up to July 30, 2003.   DISCHARGE DIAGNOSES:  1. Status post migraine.  2. Hypertension.  3. Constipation.   PROCEDURE:  Abdominal film, two view. Results:  Retained stool in the right  side of the colon, normal gas pattern.   HOSPITAL COURSE:  Following agreement to be discharged on July 30, 2003,  Ms. Sedam requested to see an attending physician at which time she was  seen by one of the family practice teaching service attendings. Ms. Nickle  became upset in that she felt she was being forced out of the hospital. Her  concerns were related to the fact that she had not had a bowel movement  while in the hospital and that her headache was not relieved. An agreement  was made that Ms. Ousley would be able to stay in the hospital at that time.  The following day, the patient was given a milk of molasses enema and a two-  view of the abdomen was performed which the results are noted above. The  patient did have a bowel movement on that day at which time she was very  relieved. The patient was discharged from the hospital the following day.                                                Franchot Mimes, MD    TV/MEDQ  D:  08/01/2003  T:  08/02/2003  Job:  [id number]   cc:   Georgina Peer, M.D.  [address] [place], [place] [address]  Fax: [phone]   Neale Burly, M.D.  [facility]  [place], [place]

## 2010-11-06 NOTE — Discharge Summary (Signed)
NAME:  Kirsten Walsh, KNODEL                      ACCOUNT NO.:  [id number]   MEDICAL RECORD NO.:  [id number]                   PATIENT TYPE:  INP   LOCATION:  3030                                 FACILITY:  [facility]   PHYSICIAN:  Pearlean Brownie, M.D.            DATE OF BIRTH:  1960/11/30   DATE OF ADMISSION:  12/04/2002  DATE OF DISCHARGE:  12/08/2002                                 DISCHARGE SUMMARY   DISCHARGE DIAGNOSES:  1. Intractable migraine.  2. Hypertension.  3. Obesity.  4. Allergic rhinitis.  5. Presyncopal episode.   DISCHARGE MEDICATIONS:  1. Hydrochlorothiazide 12.5 mg 1 tablet daily.  2. Norvasc 5 mg 1 tablet daily.  3. Depakote 250 mg 1 tablet b.i.d.  4. Ibuprofen 600 mg q.6h. p.r.n. for discomfort.   The patient is instructed to avoid beta blockers.   SPECIAL INSTRUCTIONS:  The patient is to continue to follow a low-salt, low-  fat diet and to check and record blood pressure while at home.   DISPOSITION:  The patient was discharged home in stable condition.   FOLLOW UP:  The patient is to call the family practice clinic at [phone]  the following morning for appointment with her primary care physician.   CONSULTATIONS:  Marlan Palau, M.D., [facility].   HISTORY:  Kirsten Walsh is a 50 year old female with a history of migraine, who  had multiple presentations to her primary care physician over the ten days  prior to admission.  She had a presyncopal episode in the parking lot while  coming to see her primary doctor on the day of admission.  She recently had  an MRI of the brain that was normal the day prior to admission.   PHYSICAL EXAMINATION:  GENERAL:  Ill-appearing, obese female, holding her  head.  VITAL SIGNS:  Hypertension at 190/120.  NEUROLOGIC:  Cranial nerves II-XII intact.  Cerebral function was intact.  Strength 5/5 globally. Sensation intact to light touch, and patient was  oriented and appropriate in conversation.   LABORATORY DATA:  CBG 98.  White count 3.2, hemoglobin 13.3.   EKG showed normal sinus rhythm without ischemic changes.    HOSPITAL COURSE:  1. PRESYNCOPAL EPISODE:  No evidence of seizure activity.  Normal ECG.     Nonfocal neurologic exam.  Recent MRI of the brain did not show any acute     disease.  Felt likely secondary to severe pain.  The patient was noted to     be bradycardic on presentation to the hospital. She was placed on     telemetry, and this was confirmed.  Her beta blocker was held.  Her     bradycardia resolved.  No further cardiac workup was performed at this     admission.   1. INTRACTABLE MIGRAINE:  Dr. Anne Hahn, from neurology, was consulted and     recommended a therapy regimen of DHE 50, methylprednisolone, and Reglan.  The patient responded well to this regimen, and at the time of discharge,     she had been without headache for over 12 hours.  Beta blocker for     prophylaxis had to be discontinued secondary to bradycardia.  Continued     patient's newly begun Depakote.   1. HYPERTENSION:  The patient had significant hypertension throughout her     admission.  Adjusted patient's Norvasc and hydrochlorothiazide doses.     She will need further adjustments as outpatient.     Barney Drain, M.D.                       Pearlean Brownie, M.D.    SS/MEDQ  D:  01/03/2003  T:  01/05/2003  Job:  [id number]   cc:   Bradly Bienenstock, M.D.  [facility] Resident  [place], [place] [address]  Fax: [phone]   C. Lesia Sago, M.D.  [address]  [address]  [place]  [place] [address]  Fax: [phone]    cc:   Bradly Bienenstock, M.D.  [facility] Resident  [place], [place] [address]  Fax: [phone]   C. Lesia Sago, M.D.  [address]  [address]  [place]  [place] [address]  Fax: [phone]

## 2010-11-06 NOTE — Discharge Summary (Signed)
NAME:  Kirsten Walsh, Kirsten Walsh             ACCOUNT NO.:  [id number]   MEDICAL RECORD NO.:  [id number]            PATIENT TYPE:   LOCATION:                                 FACILITY:   PHYSICIAN:  Devra Dopp, MD     DATE OF BIRTH:  09-29-1960   DATE OF ADMISSION:  03/20/2004  DATE OF DISCHARGE:  03/25/2004                                 DISCHARGE SUMMARY   DISCHARGE DIAGNOSES:  1.  Intractable migraine.  2.  Hypertension.   DISCHARGE MEDICATIONS:  1.  Ambien 10 mg at bedtime p.r.n.  2.  Flonase 2 sprays in each nostril daily p.r.n.  3.  Hydrochlorothiazide 25 mg daily.  4.  Migranal p.r.n. for migraine.  5.  Percocet as needed for headaches.  6.  Phenergan 50 mg p.r.n.  7.  Thorazine 25 mg to 50 mg p.r.n. for headache up to three times a day.  8.  Topamax 250 mg at bedtime for migraine.  9.  Zyrtec 10 mg at bedtime.   FOLLOWUP:  Call Dr. Neale Burly at the headache center for a followup  appointment.   HOSPITAL COURSE:  This is a 50 year old African American female with a  history of intractable migraines admitted several times in the past,  admitted for management of intractable migraines that involved nausea and  vomiting for two days.  The patient had been given Demerol and Phenergan in  the clinic without relief of her headache.  While in hospital, the patient  was given large bursts of D.H.E., Compazine and Toradol without relief and  with continuation of her Topamax and Ambien which did not relieve the pain.  She eventually was placed on D.H.E. protocol, which finally broke her  migraine.   Problem 2.  Hypertension.  She had some elevated blood pressures likely  secondary to pain.  The patient had not been on hydrochlorothiazide prior to  admission probably because blood pressures remained elevated while she was  in the hospital she was started on hydrochlorothiazide and discharged on it,  which she tolerated well.  Beta blockers were avoided due to problems with  bradycardia  for her.   CONDITION ON DISCHARGE:  Good.   DISCHARGE LABORATORIES:  Sodium 138, potassium 4.6, chloride 114,  bicarbonate 20, BUN 6.0, creatinine 1.0, glucose 173.                                       ___________________________________________  Devra Dopp, MD   TH/MEDQ  D:  10/16/2004  T:  10/17/2004  Job:  [id number]

## 2010-11-06 NOTE — H&P (Signed)
NAME:  Kirsten Walsh, Kirsten Walsh NO.:  [id number]   MEDICAL RECORD NO.:  [id number]          PATIENT TYPE:  INP   LOCATION:  2038                         FACILITY:  [facility]   PHYSICIAN:  Ace Gins, MD     DATE OF BIRTH:  [birth year]   DATE OF ADMISSION:  03/20/2004  DATE OF DISCHARGE:                                HISTORY & PHYSICAL   ATTENDING PHYSICIAN:  Dr. Oda Cogan.   CHIEF COMPLAINT:  Headache.   HISTORY OF PRESENT ILLNESS:  Kirsten Walsh is a 50 year old black female with a  history of intractable migraines, followed by her primary care physician,  Dr. Georgina Peer, [facility]. She comes in today  with a complaint of three days of constant migraine headache, was seen in  the ED at [facility] on March 18, 2004, given Nubain, Demerol, and  Phenergan without relief for the past two days. She has stayed home from  work, has been unable to function, and presented to the [facility] as a work in on March 20, 2004. She was given Demerol 100 mg  and Phenergan 50 mg without relief in the clinic. Felt to need admission.  She has been admitted several times in the past with DHE treatment which  apparently helped when the usual Demerol, Phenergan regimen does not.   REVIEW OF SYSTEMS:  She has increased pain and is tearful. She is  dehydrated. She has had no fever and no other illness-type symptoms.  CARDIOVASCULAR: Has noted increased blood pressure, increased heart rate  over the past three days. She denies shortness of breath, has nausea and  vomiting times two days, inability to keep food or medication down.  Neurologically, no focal symptoms, though has had photophobia and a visual  aura on day one of the headache. MUSCULOSKELETAL: No pain. ENT: Nose is  raw secondary to migraine.   PAST MEDICAL HISTORY:  1.  Obesity.  2.  Intractable migraines.  3.  Allergic rhinitis.  4.  Hypertension.  5.  Insomnia.  6.   Chronic trapezius muscle injuries.   ALLERGIES:  ASPIRIN.   MEDICATIONS:  1.  Ambien 10 mg q.h.s. p.r.n.  2.  Flonase two sprays in each nostril daily p.r.n.  3.  HCTZ 25 mg p.o. daily.  4.  Migranal p.r.n. migraine.  5.  Percocet p.r.n. headache.  6.  Phenergan 50 mg p.r.n.  7.  Thorazine 25-50 mg p.o. p.r.n. headache up to t.i.d.  8.  Topamax 250 mg p.o. q.h.s. for migraines.  9.  Zyrtec 10 mg p.o. q.h.s.   Other physicians of Dr. Anne Hahn of neurology and Dr. [facility].   PAST SURGICAL HISTORY:  1.  Cholecystectomy in January 2004.  2.  Neuromuscular injection for status migrainous in February 2005.  3.  Currently has an IUD placed in December 2003.  4.  CT of the head normal in January 2004.  5.  MRI of the brain negative in February 2005.   FAMILY HISTORY:  Mother has migraines. Father with diabetes and  hypertension.  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 Is  married with three children. Most recent in 2003. Denies smoking, denies  drug use, occasional alcohol.   PHYSICAL EXAMINATION:  VITAL SIGNS: Weight 211 pounds, temperature 99, blood  pressure 170/108, pulse 90.  GENERAL: The patient is in mild distress, very tearful, in a dark room, but  is able to be conversive and walks without problem.  HEENT: Normal conjunctivae. PERRLA. EOMI. Nasal mucosa erythematous and  edematous. Teeth, gums, and oropharynx. Nasal mucosa erythematous and  edematous. Teeth, gums, and oropharynx within normal limits.  NECK: Supple with no masses. No LA. No thyromegaly.  CVR: No murmurs, rubs, or gallops.  LUNGS: CTAP.  ABDOMEN: Obesity, soft, NT, ND.  NEUROLOGIC: Cranial nerves II-XII intact. No focal deficits. Normal DTRs.  Plus size strength globally.   No labs   ASSESSMENT/PLAN:  Kirsten Walsh is a 50 year old black female with a history of  intractable migraine.  1.  Intractable migraine not relieved in clinic  with Demerol and Phenergan,      and no other options in this clinic as an outpatient. Will admit      initially to try to relieve symptoms with a large burst of DAG 1 mg      times one, Compazine 10 mg times one, and Toradol 30 mg times one all      IV; if no relief will place on DAG protocol with Solu-Medrol and Reglan      for  4 to 5 days which has helped in the past. May need consultation of      neurology. Currently this is  not atypical and very few neurologic      symptoms. Therefore, will not obtain brain imaging at this point. Also      continue on medications of Topamax and Ambien.  2.  Hypertension. Uncontrolled at this point. Likely secondary to pain. Will      try initial pain management and home HCTZ. If pain is controlled and      blood pressure continues elevated may consider addition of another      medication. Previously tried on beta blocker causing bradycardia. May      need to discuss with neurology as the      next step for her.  3.  Prophylaxis. The patient is ambulatory, therefore no DVT prophylaxis is      felt to be needed.  4.  Will continue all other home medications as previously prescribed.       JS/MEDQ  D:  03/20/2004  T:  03/20/2004  Job:  [id number]   cc:   [facility]   Marlan Palau, M.D.  [address]  [address]  [place]  [place] [address]  Fax: [phone]   Georgina Peer, M.D.  [address] [place], [place] [address]  Fax: [phone]

## 2010-11-06 NOTE — Discharge Summary (Signed)
NAME:  Kirsten Walsh, Kirsten Walsh                ACCOUNT NO.:  [id number]   MEDICAL RECORD NO.:  [id number]                   PATIENT TYPE:  INP   LOCATION:  9151                                 FACILITY:  [facility]   PHYSICIAN:  Jonah Blue, M.D.                DATE OF BIRTH:  Jan 07, 1961   DATE OF ADMISSION:  03/20/2002  DATE OF DISCHARGE:  03/21/2002                                 DISCHARGE SUMMARY   PRIMARY CARE PHYSICIAN:  Bradly Bienenstock, M.D. of the [facility].   DISCHARGE DIAGNOSES:  1. Intrauterine pregnancy at 30.[redacted] weeks gestation.  2. Migraine headaches.  3. Bacterial vaginosis.  4. Borderline diabetes mellitus.  5. Uterine irritability.   DISCHARGE MEDICATIONS:  1. Flagyl 500 mg p.o. b.i.d. with meals x7 days.  2. Procardia XL 30 mg p.o. b.i.d. dispense #60 one refill.  3. Tylenol No. 3 one tab p.o. q.4-6 h. p.r.n. severe pain dispense #20 no     refills.   PROCEDURES AND DIAGNOSTIC STUDIES:  1. Continuous fetal monitoring.  2. OB ultrasound shows AFI of 15.2, cervix 3 cm, placenta grade 1, cephalic     presentation.   HISTORY:  This is a 50 year old, G4, P 2-0-1-2 at 29-6/7 admitted with sharp  stabbing right lower quadrant pain that radiated to the right upper quadrant  causing nausea.  Pregnancy complications include amniocentesis on December 19, 2001, for a greater than 40 year old mother, history of Trichomonas in the  first trimester, history of HSV, hyperemesis gravidarum.  Historical  complications include a history of placental abruption with her second  pregnancy, remote history of Chlamydia, and an initial HSV outbreak but no  outbreak since.   HOSPITAL COURSE:  Problem #1.  Uterine irritability:  The patient was  relatively stable from this perspective after rehydration.  She was  restarted on Procardia XL for her headaches but this should also help with  her uterine irritability.  The patient had no cervical change throughout her   hospitalization.   Problem #2.  Bacterial vaginosis:  On admission, the patient was found to  have bacterial vaginosis and was treated with Flagyl throughout her  hospitalization.  She will go home on Flagyl 500 mg p.o. b.i.d. with meals  x7 days.   Problem #3.  Migraine headaches:  The patient does have a long history of  migraine headaches and this problem has been ongoing for her.  She does not  have any other evidence of preeclampsia and this is not believed to be  secondary to a vascular problem other than migraine headaches.  The patient  was restarted on her Procardia which has been helpful for her for her  migraines in the past.  This medication will be Procardia XL 30 mg p.o.  b.i.d.  Additionally, she was given Tylenol No. 3 for breakthrough pain one  tab p.o. q.4-6 h. p.r.n. severe pain dispense #20 no refills.   Problem #4.  Large for gestational  age baby with a failed one-hour glucose  test:  Her three-hour glucose tolerance test, however, showed normal values  at all three measurements.  However, a fasting CBG this morning was 95 which  is in the diabetic range.  The patient will be referred to nutrition and  diabetes management center by Dr. [name] at her followup appointment.   Problem #5.  History of placental abruption:  It is unclear whether the  patient had an antiphospholipid workup in the past.  Therefore,  antiphospholipid labs were drawn and this should be followed up on as an  outpatient.   FOLLOWUP:  The patient is to follow up with Dr. [name] at the [facility] tomorrow morning, October 2nd at 8:30 a.m.                                               Jonah Blue, M.D.    Milas Gain  D:  03/21/2002  T:  03/21/2002  Job:  [id number]   cc:   Bradly Bienenstock, M.D.  [facility] Resident  [place], [place] [address]  Fax: [phone]

## 2010-11-06 NOTE — Op Note (Signed)
NAME:  Kirsten Walsh, Kirsten Walsh                      ACCOUNT NO.:  [id number]   MEDICAL RECORD NO.:  [id number]                   PATIENT TYPE:  INP   LOCATION:  5019                                 FACILITY:  [facility]   PHYSICIAN:  Angelia Mould. [name], M.D.             DATE OF BIRTH:  05/25/1961   DATE OF PROCEDURE:  06/30/2002  DATE OF DISCHARGE:                                 OPERATIVE REPORT   PREOPERATIVE DIAGNOSIS:  Chronic cholecystitis with cholelithiasis and  biliary pancreatitis.   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   OPERATION PERFORMED:  Laparoscopic cholecystectomy with intraoperative  cholangiogram, lysis of perihepatic adhesions (chronic).   SURGEON:  Angelia Mould. [name], M.D.   FIRST ASSISTANT:  Gita Kudo, M.D.   OPERATIVE INDICATIONS:  This is a 50 year old black female who is 2 months  postpartum.  She was admitted to the hospital on January 8 with upper  abdominal pain, nausea, and vomiting.  Gallbladder ultrasound showed  thickening of the gallbladder wall and multiple gallstones.  Serum lipase  was 840.  Serum alkaline phosphatase was 177.  Bilirubin was normal.  Over  the past 48 hours, her pain has markedly improved, and her amylase and  lipase have returned almost to normal.  She still has mild elevation of her  liver function tests.  She is brought to the operating room for  cholecystectomy.   OPERATIVE FINDINGS:  The patient's gallbladder was somewhat edematous but  not severely so.  There was no fat necrosis.  The cystic duct and common  bile duct were somewhat dilated, but the cholangiogram showed no filling  defects, good flow of contrast into the duodenum, and normal intrahepatic  and extrahepatic biliary anatomy.  The liver itself appeared normal, but  there were extensive adhesions from the dome of the right and the left lobes  of the liver to the hemidiaphragms, which were chronic.  These  were divided.  The stomach, duodenum, small intestine, and large intestine were, otherwise,  normal to inspection.   OPERATIVE TECHNIQUE:  Following the induction of general endotracheal  anesthesia, the patient's abdomen was prepped and draped in a sterile  fashion.  0.5% Marcaine with epinephrine was used as a local infiltration  anesthetic.  A vertically oriented incision was made at the lower end of the  umbilicus.  The fascia was incised in the midline and the abdominal cavity  entered under direct vision.  A 10 mm Hasson trocar was inserted and was  secured with a pursestring suture of 0 Vicryl.  Pneumoperitoneum was  created.  The video camera was inserted with visualization and findings as  described above.  A 10 mm trocar was placed in the subxiphoid region and two  5 mm trocars placed in the right midabdomen.  Using sharp scissors, we  divided all of the perihepatic adhesions.  The gallbladder fundus was then  elevated.  I  took some adhesions down, exposing the infundibulum of the  gallbladder.  We very carefully dissected out the cystic duct and cystic  artery.  I isolated the cystic artery as it went onto the gallbladder wall,  secured it with metal clips, and divided it.  I then created a large window  behind the cystic duct, demonstrating the anatomy.  The cystic duct was  secured with a metal clip close the gallbladder.  A cholangiogram catheter  was inserted into the cystic duct.  A cholangiogram was obtained using a C-  arm.  This showed somewhat dilated ducts but no obstruction, good flow of  contrast into the duodenum.  There was no filling defect, and the  intrahepatic and extrahepatic bile duct anatomy looked normal. The  cholangiogram catheter was removed.  The cystic duct was secured with three  metal clips and divided. The gallbladder was dissected from its bed with  electrocautery and removed through the umbilical port.  The operative field  was copiously  irrigated with saline.  At the completion of the case, there  was no bleeding and no bile leak whatsoever.  Hemostasis in the gallbladder  bed was achieved with electrocautery.  The trocars were removed under direct  vision, and there was no bleeding from the trocar sites.  The  pneumoperitoneum was released.  The fascia at the umbilicus was closed with  0 Vicryl sutures.  The skin incisions were closed with subcuticular sutures  of 4-0 Vicryl and Steri-Strips.  Clean bandages were placed, and the patient  was taken to the recovery room in a stable condition.  The estimated blood  loss was about 10 cc.   COMPLICATIONS:  None.   SPONGE AND INSTRUMENT COUNTS:  Correct.                                               Angelia Mould. [name], M.D.    HMI/MEDQ  D:  06/30/2002  T:  06/30/2002  Job:  [id number]   cc:   Pearlean Brownie, M.D.  [address] [place]  [place] [address]  Fax: [phone]

## 2010-11-06 NOTE — H&P (Signed)
NAME:  Kirsten Walsh, Kirsten Walsh            ACCOUNT NO.:  [id number]   MEDICAL RECORD NO.:  [id number]          PATIENT TYPE:  OBV   LOCATION:  3025                         FACILITY:  [facility]   PHYSICIAN:  Billey Gosling, M.D.    DATE OF BIRTH:  1960-10-09   DATE OF ADMISSION:  06/22/2004  DATE OF DISCHARGE:                                HISTORY & PHYSICAL   CHIEF COMPLAINT:  Migraine headache.   HISTORY OF PRESENT ILLNESS:  Ms. Witts is a 50 year old black female with  history of intractable migraines and allergic rhinitis, who presents with  daily headaches since February 2005.  She now states that her headache has  been worsening over the past four days.  She describes the pain as sharp and  right-sided; also with right ear pain, nausea and vomiting over the past  four days.  She went to [facility] emergency room two days ago and was  given Dilaudid and Phenergan without relief, though she was discharged home.  She was given Demerol and Phenergan x1 so far here in the ER, without  relief.  She is also  not keeping any fluids down.  She also states she has  been having diarrhea over the past three days, with about 3-5 stools per day  and no blood in them.   REVIEW OF SYSTEMS:  Per HPI, as well as positive for lightheadedness,  photophobia and phonophobia, as well as some eye floaters; but, negative for  chest pain, fever, shortness of breath, rashes, dysuria or sore throat.   PAST MEDICAL HISTORY:  1.  Migraine headache.  2.  Allergic rhinitis.  3.  Hypertension.  4.  Insomnia.  5.  History of Herpes Simplex type 2.  6.  Chronic trapezius muscle injury pain.   PAST SURGICAL HISTORY:  1.  Cholecystectomy in January 2004.  2.  IUD placement November 2003.   MEDICATIONS:  1.  Ambien 10 mg q.h.s. p.r.n. insomnia.  2.  Flonase two sprays q. nostril q.d.  3.  HCTZ 25 mg q.d.  4.  Migranol p.r.n. migraine (but she said she is not taking secondary to      nasal burning).  5.   Phenergan 50 mg per rectum p.r.n. (but patient has not been taking since      she has had the diarrhea).  6.  Thorazine 25-50 mg p.r.n. headache, up to t.i.d.  7.  Topamax 250 mg q.h.s.  8.  Zyrtec 10 mg p.r.n.   ALLERGIES:  ASPIRIN (causes anaphylaxis).   FAMILY HISTORY:  Mother has migraines.  Father has diabetes and  hypertension.   SOCIAL HISTORY:  The patient is single, but has a longterm partner with  three children; her youngest is two years of age.  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  She denies any smoking and  drinks alcohol occasionally.   PHYSICAL EXAMINATION:  VITAL SIGNS:  Temperature 97.9, respirations 20,  pulse 74, blood pressure 137/83, oxygen saturation 99% on room air.  GENERAL:  The patient is lying on a stretcher with a pillow over his  face,  but she appears comfortable during the interview and is in no acute  distress.  HEENT:  Normocephalic and atraumatic.  Pupils equal, round and reactive to  light.  Extraocular movements intact.  TM's intact, with a light reflex  bilaterally.  Oropharynx clear with dry mucous membranes.  NECK:  Supple, no lymphadenopathy.  No thyromegaly.  HEART:  Regular rate and rhythm without murmurs, rubs or gallops.  LUNGS:  Clear to auscultation bilaterally, with good effort.  EXTREMITIES:  Trace lower extremity edema bilaterally.  Strength 5/5  bilaterally and no clubbing or cyanosis.  ABDOMEN:  Soft and nontender.  Nondistended.  Normal active bowel sounds.  NEUROLOGIC:  Cranial nerves II-XII grossly intact.  No focal deficits on  examination.  Sensation is intact bilaterally and deep tendon reflexes are  symmetrical bilaterally.   LABORATORY DATA:  None.   ASSESSMENT AND PLAN:  A 50 year old black female with headache, nausea,  vomiting and diarrhea x4 days.  1.  STATUS MIGRAINOUS.  The patient has had no relief with two ER visits so      far.  Given Demerol and Phenergan today without  improvement.  The      patient will be admitted for 23 hr observation and start Dilaudid PCA,      Phenergan and Zofran.  She has no neurologic deficits to suggest the      need for further workup with imaging.  If the patient has no improvement      with the Dilaudid, will consider DHE protocol, as that has helped her in      the past.  The patient will need close follow up with [facility] (Dr. Neale Burly).   1.  DEHYDRATION.  Secondary to nausea, vomiting and diarrhea.  We will start      normal saline bolus then D5 half-normal saline with 20 mEq potassium at      200 cc/hr and __________ clears.  Will check a BNP.   1.  DIARRHEA.  May be infectious, since no history of recent antibiotics or      travel.  Check stool studies.   1.  HYPERTENSION.  Stable on HCTZ.   1.  ALLERGIC RHINITIS.  Continue Flonase and I have instructed the patient      on taking her Zyrtec daily, not as needed.       AS/MEDQ  D:  06/22/2004  T:  06/22/2004  Job:  [id number]   cc:   Georgina Peer, M.D.  [address] [place], [place] [address]  Fax: [phone]   Dr. Neale Burly, [facility]

## 2010-11-12 ENCOUNTER — Encounter: Payer: Self-pay | Admitting: Family Medicine

## 2010-11-12 ENCOUNTER — Ambulatory Visit (INDEPENDENT_AMBULATORY_CARE_PROVIDER_SITE_OTHER): Payer: Medicaid Other | Admitting: Family Medicine

## 2010-11-12 VITALS — BP 162/92 | HR 89 | Temp 98.7°F | Wt 219.0 lb

## 2010-11-12 DIAGNOSIS — G43909 Migraine, unspecified, not intractable, without status migrainosus: Secondary | ICD-10-CM

## 2010-11-12 DIAGNOSIS — I10 Essential (primary) hypertension: Secondary | ICD-10-CM

## 2010-11-12 DIAGNOSIS — G43019 Migraine without aura, intractable, without status migrainosus: Secondary | ICD-10-CM

## 2010-11-12 MED ORDER — PROMETHAZINE HCL 25 MG RE SUPP: 25.0000 mg | Freq: Three times a day (TID) | RECTAL | Status: DC | PRN

## 2010-11-12 MED ORDER — DIPHENHYDRAMINE HCL 50 MG/ML IJ SOLN
25.0000 mg | Freq: Once | INTRAMUSCULAR | Status: AC
  Administered 2010-11-12: 25 mg via INTRAMUSCULAR

## 2010-11-12 MED ORDER — PROMETHAZINE HCL 25 MG PO TABS: 25.0000 mg | ORAL_TABLET | Freq: Four times a day (QID) | ORAL | Status: DC | PRN

## 2010-11-12 MED ORDER — KETOROLAC TROMETHAMINE 60 MG/2ML IM SOLN
60.0000 mg | Freq: Once | INTRAMUSCULAR | Status: AC
  Administered 2010-11-12: 60 mg via INTRAMUSCULAR

## 2010-11-12 MED ORDER — PROMETHAZINE HCL 25 MG/ML IJ SOLN
25.0000 mg | Freq: Once | INTRAMUSCULAR | Status: AC
  Administered 2010-11-12: 25 mg via INTRAMUSCULAR

## 2010-11-12 MED ORDER — OXYCODONE-ACETAMINOPHEN 7.5-325 MG PO TABS: 1.0000 | ORAL_TABLET | ORAL | Status: DC | PRN

## 2010-11-12 NOTE — Progress Notes (Signed)
  Subjective:    Patient ID: Kirsten Walsh, female    DOB: 1961/02/08, 50 y.o.   MRN: [id number]  HPI 1. Migraine:  She has had this since yesterday.  Started when she was here with her daughter.  It progressed last night and is severe this morning.  No injury or inciting event.  It feels the same as her usual migraines.  However, this one is bilateral compared to using being just on the left.  Associated photophobia, phonophobia, n/v.  Pain rated a 10/10.  Her normal medications haven't helped.  She needs a refill on Phenergan.  The Hydrocodone doesn't help.    2. HTN;  She has been taking her medications as prescribed.  Her blood pressure always goes up when she has a migraine.   She doesn't check her blood pressure at home regularly.   Review of Systems  Constitutional: Negative for fever, chills and unexpected weight change.  HENT: Negative for facial swelling, neck pain and neck stiffness.   Eyes: Negative for pain, redness and visual disturbance.  Respiratory: Negative for shortness of breath.   Cardiovascular: Negative for chest pain.       Objective:   Physical Exam  Vitals reviewed. Constitutional: She is oriented to person, place, and time. She appears well-nourished. No distress.       Sitting in dark exam room  HENT:  Head: Normocephalic and atraumatic.  Eyes: Conjunctivae and EOM are normal. Pupils are equal, round, and reactive to light.  Neck: Normal range of motion. Neck supple. No JVD present.  Cardiovascular: Normal rate and regular rhythm.   Pulmonary/Chest: Effort normal and breath sounds normal. No respiratory distress.  Abdominal: Soft.  Lymphadenopathy:    She has no cervical adenopathy.  Neurological: She is alert and oriented to person, place, and time. She displays normal reflexes. No cranial nerve deficit. She exhibits normal muscle tone. Coordination normal.  Skin: She is not diaphoretic.          Assessment & Plan:

## 2010-11-12 NOTE — Assessment & Plan Note (Signed)
Likely 2/2 pain from the migraine.  She is not interested in starting another BP medicine at this time.  Will recheck it at next visit when her pain is under better control.

## 2010-11-12 NOTE — Assessment & Plan Note (Signed)
Unable to control migraines.  Has been hospitalized multiple times.  [facility] and [facility] have nothing more to offer except Botox injections, which she can't afford.  She was seen by Dr. Sharene Skeans in the hospital but there is a financial dispute with his clinic and her owing them money.  Will attempt to control the pain in the clinic.  Will also send to another Neurologist for a second opinion.

## 2010-11-18 ENCOUNTER — Emergency Department (HOSPITAL_COMMUNITY)
Admission: EM | Admit: 2010-11-18 | Discharge: 2010-11-18 | Disposition: A | Discharge status: Home / Self Care | Payer: Medicaid Other | Attending: Emergency Medicine | Admitting: Emergency Medicine

## 2010-11-18 DIAGNOSIS — I10 Essential (primary) hypertension: Secondary | ICD-10-CM | POA: Insufficient documentation

## 2010-11-18 DIAGNOSIS — G43909 Migraine, unspecified, not intractable, without status migrainosus: Secondary | ICD-10-CM | POA: Insufficient documentation

## 2010-11-23 ENCOUNTER — Ambulatory Visit: Payer: Medicaid Other | Admitting: Family Medicine

## 2010-12-03 ENCOUNTER — Telehealth: Payer: Self-pay | Admitting: *Deleted

## 2010-12-03 NOTE — Telephone Encounter (Signed)
Received phone call from [place] [facility] [address] with information she wanted to make MD aware of. Yesterday on the pharmacy voicemail line she received a call for Dr. Memory Dance calling in RX for Lortabs 10 mg one tab every 4-6 hours as needed for pain.  On 12/01/2010 patient brought in and picked up RX that was written by Dr. Lelon Perla on 11/12/2010 for percocet. Also she had previously gotten Rx for tramadol on 11/05/2010 for # 30 tabs on 11/05/2010 written by Dr. Katrinka Blazing. They contacted Dr. Tristan Schroeder and she cancelled RX for Lortabs. Will forward message to Dr. Lelon Perla.

## 2010-12-09 NOTE — Telephone Encounter (Signed)
Reviewed note

## 2010-12-09 NOTE — Telephone Encounter (Signed)
Pt should sign a pain contract next time she is in clinic.

## 2010-12-11 ENCOUNTER — Ambulatory Visit: Payer: Medicaid Other | Admitting: Family Medicine

## 2011-01-01 ENCOUNTER — Ambulatory Visit (HOSPITAL_BASED_OUTPATIENT_CLINIC_OR_DEPARTMENT_OTHER): Payer: Medicaid Other | Attending: Family Medicine

## 2011-01-01 DIAGNOSIS — G4710 Hypersomnia, unspecified: Secondary | ICD-10-CM | POA: Insufficient documentation

## 2011-01-02 NOTE — Procedures (Signed)
NAME:  Kirsten Walsh, Kirsten Walsh            ACCOUNT NO.:  [id number]  MEDICAL RECORD NO.:  [id number]          PATIENT TYPE:  OUT  LOCATION:  SLEEP CENTER                 FACILITY:  [facility]  PHYSICIAN:  Fusako Tanabe D. Maple Hudson, MD, FCCP, FACPDATE OF BIRTH:  11-04-62  DATE OF STUDY:  01/01/2011                           NOCTURNAL POLYSOMNOGRAM  REFERRING PHYSICIAN:  Antoine Primas, DO  REFERRING PHYSICIAN:  Antoine Primas, DO  INDICATIONS FOR STUDY:  Hypersomnia with sleep apnea.  EPWORTH SLEEPINESS SCORE:  7/24, BMI 37.3.  Weight 224 pounds, height 65 inches.  Neck 16 inches.  Home medications are charted and reviewed.  SLEEP ARCHITECTURE:  Total sleep time 286 minutes with sleep efficiency 68.8%.  Stage I was 1.2%, stage II 67.1%, stage III absent, REM 31.6% of total sleep time.  Sleep latency 55 minutes, REM latency 59.5 minutes, awake after sleep onset 74 minutes, arousal index 5.5.  Bedtime medication:  Oxycodone.  RESPIRATORY DATA:  Apnea/hypopnea index (AHI) 0.6 per hour.  A total of 3 events were scored, as hypopneas associated with nonsupine sleep and REM.  There were insufficient numbers of events to permit application of CPAP titration by split protocol on this study night.  OXYGEN DATA:  Moderate snoring with oxygen desaturation to a nadir of 93% and a mean oxygen saturation through the study of 97.5% on room air.  CARDIAC DATA:  Normal sinus rhythm.  MOVEMENT/PARASOMNIA:  No significant movement disturbance.  Bathroom x1.  IMPRESSION/RECOMMENDATIONS: 1. Sleep architecture was significant for an interval of spontaneous     waking between 2 and 3:00 a.m.  Oxycodone was taken at bedtime. 2. Occasional respiratory event with sleep disturbance, within normal     limits, AHI 0.6 per hour (normal range 0-5     per hour).  Moderate snoring with oxygen desaturation to a nadir of     93% and a mean oxygen saturation through the study of 97.5% on room     air.     [name], MD,  [facility], FACP Diplomate, Biomedical engineer of Sleep Medicine Electronically Signed    CDY/MEDQ  D:  01/02/2011 12:07:32  T:  01/02/2011 22:11:45  Job:  [id number]

## 2011-01-12 ENCOUNTER — Encounter: Payer: Self-pay | Admitting: Family Medicine

## 2011-01-12 ENCOUNTER — Ambulatory Visit (INDEPENDENT_AMBULATORY_CARE_PROVIDER_SITE_OTHER): Payer: Medicaid Other | Admitting: Family Medicine

## 2011-01-12 VITALS — BP 165/105 | HR 96 | Temp 98.8°F | Wt 222.0 lb

## 2011-01-12 DIAGNOSIS — G43019 Migraine without aura, intractable, without status migrainosus: Secondary | ICD-10-CM

## 2011-01-12 MED ORDER — KETOROLAC TROMETHAMINE 30 MG/ML IJ SOLN
30.0000 mg | Freq: Once | INTRAMUSCULAR | Status: AC
Start: 2011-01-12 — End: 2011-01-12
  Administered 2011-01-12: 30 mg via INTRAMUSCULAR

## 2011-01-12 MED ORDER — PROMETHAZINE HCL 25 MG/ML IJ SOLN
25.0000 mg | Freq: Once | INTRAMUSCULAR | Status: AC
  Administered 2011-01-12: 25 mg via INTRAMUSCULAR

## 2011-01-12 MED ORDER — METOCLOPRAMIDE HCL 10 MG PO TABS: 10.0000 mg | ORAL_TABLET | Freq: Once | ORAL | Status: AC

## 2011-01-12 MED ORDER — PREDNISONE 20 MG PO TABS: 40.0000 mg | ORAL_TABLET | Freq: Three times a day (TID) | ORAL | Status: AC

## 2011-01-12 MED ORDER — DIHYDROERGOTAMINE MESYLATE 4 MG/ML NA SOLN: 1.0000 | Freq: Once | NASAL | Status: DC

## 2011-01-12 MED ORDER — DIPHENHYDRAMINE HCL 50 MG/ML IJ SOLN
25.0000 mg | Freq: Once | INTRAMUSCULAR | Status: AC
  Administered 2011-01-12: 25 mg via INTRAMUSCULAR

## 2011-01-12 NOTE — Progress Notes (Signed)
Subjective:    Kirsten Walsh is a 50 y.o. female who presents for evaluation of headache. Symptoms began about several months ago. Generally, the headaches last about several months and occur continuously. The headaches are usually better in the winter and are usually worse in the summer. The headaches are usually sharp and throbbing and are located in right side of the head, face, and neck.  The patient rates her most severe headaches a 10 on a scale from 1 to 10. Recently, the headaches have been increasing in severity. Work attendance or other daily activities are affected by the headaches. Precipitating factors include: none which have been determined. The headaches are usually preceded by an aura consisting of numbness in Right hand and foot and photopsias. Associated neurologic symptoms: numbness of extremities, vision problems and vomiting in the early morning. The patient denies loss of balance and muscle weakness. Home treatment has included acetaminophen and ibuprofen, DHE, Percocet, propanolol and toradol injection, darkening the room, resting, sleeping and hospitalization for DHE protocol with the most improvement with DHE protocol, other treatments do not break the headache cycle. Other history includes: migraine headaches diagnosed in the past.     Review of Systems Pertinent items are noted in HPI.    Objective:    BP 165/105  Pulse 96  Temp(Src) 98.8 F (37.1 C) (Oral)  Wt 222 lb (100.699 kg) General appearance: alert, cooperative and mild distress Eyes: conjunctivae/corneas clear. PERRL, EOM's intact. Fundi benign. Nose: Nares normal. Septum midline. Mucosa normal. No drainage or sinus tenderness. Throat: lips, mucosa, and tongue normal; teeth and gums normal Neck: no adenopathy, no carotid bruit, no JVD, supple, symmetrical, trachea midline and thyroid not enlarged, symmetric, no tenderness/mass/nodules Neurologic: Alert and oriented X 3, normal strength and tone. Normal  symmetric reflexes. Normal coordination and gait    Assessment:    Complicated migraine    Plan:    Lie in darkened room and apply cold packs as needed for pain. Side effect profile discussed in detail. Asked to keep headache diary. Patient reassured that neurodiagnostic workup not indicated from benign H&P. Rx nasal DHE, Reglan, and three days of reglan to break cycle.  IM Phenergan, Toradol, and benadryl in office today Recommend discontinuation of narcotics as they cause rebound headaches.

## 2011-01-12 NOTE — Patient Instructions (Signed)
It was nice to meet you. I am sorry your headaches have been so bad.  I have sent a prescription to your pharmacy for a nasal DHE, one dose of reglan to take before the DHE nasal spray, and three days of prednisone.  This should help break your headache.    Please be sure to take your other home medications too.  I recommend you do not take any narcotics as these tend to cause rebound headaches and do not treat the cause of your migraines.

## 2011-01-12 NOTE — Assessment & Plan Note (Signed)
Lie in darkened room and apply cold packs as needed for pain.  Side effect profile discussed in detail.  Asked to keep headache diary.  Patient reassured that neurodiagnostic workup not indicated from benign H&P.  Rx nasal DHE, Reglan, and three days of reglan to break cycle.  IM Phenergan, Toradol, and benadryl in office today  Recommend discontinuation of narcotics as they cause rebound headaches.

## 2011-01-14 ENCOUNTER — Encounter: Payer: Self-pay | Admitting: Family Medicine

## 2011-02-06 ENCOUNTER — Emergency Department (HOSPITAL_COMMUNITY)
Admission: EM | Admit: 2011-02-06 | Discharge: 2011-02-07 | Disposition: A | Discharge status: Home / Self Care | Payer: Medicaid Other | Attending: Emergency Medicine | Admitting: Emergency Medicine

## 2011-02-06 ENCOUNTER — Emergency Department (HOSPITAL_COMMUNITY): Payer: Medicaid Other

## 2011-02-06 DIAGNOSIS — R51 Headache: Secondary | ICD-10-CM | POA: Insufficient documentation

## 2011-02-06 DIAGNOSIS — R112 Nausea with vomiting, unspecified: Secondary | ICD-10-CM | POA: Insufficient documentation

## 2011-02-06 DIAGNOSIS — I10 Essential (primary) hypertension: Secondary | ICD-10-CM | POA: Insufficient documentation

## 2011-02-09 ENCOUNTER — Emergency Department (HOSPITAL_COMMUNITY)
Admission: EM | Admit: 2011-02-09 | Discharge: 2011-02-10 | Disposition: A | Discharge status: Home / Self Care | Payer: Medicaid Other | Attending: Emergency Medicine | Admitting: Emergency Medicine

## 2011-02-09 DIAGNOSIS — I10 Essential (primary) hypertension: Secondary | ICD-10-CM | POA: Insufficient documentation

## 2011-02-09 DIAGNOSIS — H53149 Visual discomfort, unspecified: Secondary | ICD-10-CM | POA: Insufficient documentation

## 2011-02-09 DIAGNOSIS — R112 Nausea with vomiting, unspecified: Secondary | ICD-10-CM | POA: Insufficient documentation

## 2011-02-09 DIAGNOSIS — Z79899 Other long term (current) drug therapy: Secondary | ICD-10-CM | POA: Insufficient documentation

## 2011-02-09 DIAGNOSIS — G43909 Migraine, unspecified, not intractable, without status migrainosus: Secondary | ICD-10-CM | POA: Insufficient documentation

## 2011-02-09 DIAGNOSIS — K089 Disorder of teeth and supporting structures, unspecified: Secondary | ICD-10-CM | POA: Insufficient documentation

## 2011-03-11 ENCOUNTER — Emergency Department (HOSPITAL_COMMUNITY)
Admission: EM | Admit: 2011-03-11 | Discharge: 2011-03-11 | Disposition: A | Discharge status: Home / Self Care | Payer: Medicaid Other | Attending: Emergency Medicine | Admitting: Emergency Medicine

## 2011-03-11 DIAGNOSIS — R55 Syncope and collapse: Secondary | ICD-10-CM | POA: Insufficient documentation

## 2011-03-11 DIAGNOSIS — R252 Cramp and spasm: Secondary | ICD-10-CM | POA: Insufficient documentation

## 2011-03-11 DIAGNOSIS — I10 Essential (primary) hypertension: Secondary | ICD-10-CM | POA: Insufficient documentation

## 2011-03-11 DIAGNOSIS — Z79899 Other long term (current) drug therapy: Secondary | ICD-10-CM | POA: Insufficient documentation

## 2011-03-11 LAB — POCT I-STAT, CHEM 8
BUN: 4 mg/dL — ABNORMAL LOW (ref 6–23)
Chloride: 110 mEq/L (ref 96–112)
Creatinine, Ser: 1 mg/dL (ref 0.50–1.10)
Glucose, Bld: 94 mg/dL (ref 70–99)
Hemoglobin: 13.9 g/dL (ref 12.0–15.0)
Potassium: 3.8 mEq/L (ref 3.5–5.1)
Sodium: 139 mEq/L (ref 135–145)

## 2011-03-11 LAB — POCT PREGNANCY, URINE: Preg Test, Ur: NEGATIVE

## 2011-03-18 LAB — HEMOGLOBIN AND HEMATOCRIT, BLOOD: Hemoglobin: 12.3

## 2011-03-18 LAB — BASIC METABOLIC PANEL
BUN: 12
BUN: 6
CO2: 23
Calcium: 7.8 — ABNORMAL LOW
Calcium: 8.5
Chloride: 107
Chloride: 109
Creatinine, Ser: 2.07 — ABNORMAL HIGH
Creatinine, Ser: 0.97
GFR calc Af Amer: 34 — ABNORMAL LOW
GFR calc non Af Amer: 26 — ABNORMAL LOW
GFR calc non Af Amer: 28 — ABNORMAL LOW
GFR calc non Af Amer: 40 — ABNORMAL LOW
Glucose, Bld: 125 — ABNORMAL HIGH
Glucose, Bld: 131 — ABNORMAL HIGH
Glucose, Bld: 173 — ABNORMAL HIGH
Potassium: 3.1 — ABNORMAL LOW
Sodium: 135
Sodium: 136

## 2011-03-18 LAB — DIFFERENTIAL
Basophils Absolute: 0
Basophils Absolute: 0
Basophils Relative: 0
Basophils Relative: 1
Eosinophils Absolute: 0
Eosinophils Absolute: 0.1
Eosinophils Relative: 2
Lymphocytes Relative: 20
Lymphs Abs: 1.4
Monocytes Absolute: 0.5
Monocytes Absolute: 0.7
Monocytes Relative: 9
Neutro Abs: 4.9
Neutrophils Relative %: 95 — ABNORMAL HIGH
Neutrophils Relative %: 68

## 2011-03-18 LAB — URINALYSIS, ROUTINE W REFLEX MICROSCOPIC
Bilirubin Urine: NEGATIVE
Glucose, UA: NEGATIVE
Ketones, ur: 15 — AB
Nitrite: NEGATIVE
Protein, ur: 100 — AB
Protein, ur: >300 — AB
Specific Gravity, Urine: 1.021
Urobilinogen, UA: 1
Urobilinogen, UA: 0.2
pH: 7.5
pH: 5.5

## 2011-03-18 LAB — URINE MICROSCOPIC-ADD ON

## 2011-03-18 LAB — CBC
HCT: 34.2 — ABNORMAL LOW
HCT: 37.5
Hemoglobin: 11.3 — ABNORMAL LOW
Hemoglobin: 11.7 — ABNORMAL LOW
Hemoglobin: 12.8
MCHC: 34.6
MCHC: 34.1
MCV: 91.4
Platelets: 115 — ABNORMAL LOW
Platelets: 230
RBC: 4.11
RDW: 13.9
RDW: 14.1
RDW: 14.9
RDW: 15.3
WBC: 10.5
WBC: 13.8 — ABNORMAL HIGH
WBC: 7.2

## 2011-03-18 LAB — URINE CULTURE
Colony Count: >=100000
Colony Count: 25000

## 2011-03-18 LAB — PREGNANCY, URINE
Preg Test, Ur: NEGATIVE
Preg Test, Ur: NEGATIVE

## 2011-03-29 ENCOUNTER — Ambulatory Visit (INDEPENDENT_AMBULATORY_CARE_PROVIDER_SITE_OTHER): Payer: Medicaid Other | Admitting: Family Medicine

## 2011-03-29 VITALS — BP 165/121

## 2011-03-29 DIAGNOSIS — G8929 Other chronic pain: Secondary | ICD-10-CM | POA: Insufficient documentation

## 2011-03-29 MED ORDER — GABAPENTIN 300 MG PO CAPS: ORAL_CAPSULE | ORAL | Status: DC
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
## 2011-03-29 NOTE — Patient Instructions (Signed)
Increase your gabapentin to one in the am and two at night for 7 days then increase to two tabs in the am and two at bed time. Please follow up with me or with dr Lula Olszewski in 4-6 weeks'

## 2011-03-30 LAB — BASIC METABOLIC PANEL
Calcium: 8.9
Chloride: 106
GFR calc Af Amer: >60
GFR calc Af Amer: >60
GFR calc non Af Amer: >60
GFR calc non Af Amer: >60
Potassium: 4.4
Potassium: 5.4 — ABNORMAL HIGH
Sodium: 137

## 2011-03-30 LAB — COMPREHENSIVE METABOLIC PANEL
ALT: 20
BUN: 7
Calcium: 8.8
Creatinine, Ser: 0.98
Glucose, Bld: 96
Sodium: 136
Total Protein: 7.3

## 2011-03-30 LAB — CBC
Hemoglobin: 14.2
MCHC: 33.7
MCV: 92.9
RDW: 13.4

## 2011-03-30 LAB — CK: Total CK: 51

## 2011-03-30 LAB — SEDIMENTATION RATE: Sed Rate: 7

## 2011-03-31 LAB — DIFFERENTIAL
Basophils Absolute: 0
Basophils Relative: 1
Eosinophils Absolute: 0.1
Monocytes Absolute: 0.3
Monocytes Relative: 7
Neutro Abs: 2.3

## 2011-03-31 LAB — I-STAT 8, (EC8 V) (CONVERTED LAB)
Acid-base deficit: 1
BUN: 5 — ABNORMAL LOW
Bicarbonate: 24
Chloride: 107
Glucose, Bld: 101 — ABNORMAL HIGH
HCT: 43
Hemoglobin: 14.6
Operator id: 288331
Potassium: 3.8
Sodium: 139
TCO2: 25
pCO2, Ven: 39.1 — ABNORMAL LOW
pH, Ven: 7.396 — ABNORMAL HIGH

## 2011-03-31 LAB — CBC
Hemoglobin: 13.5
MCHC: 33.8
MCV: 92.7
RBC: 4.3
RDW: 13.7

## 2011-03-31 LAB — POCT I-STAT CREATININE
Creatinine, Ser: 0.9
Operator id: 288331

## 2011-04-26 ENCOUNTER — Encounter: Payer: Self-pay | Admitting: Family Medicine

## 2011-04-26 ENCOUNTER — Ambulatory Visit (INDEPENDENT_AMBULATORY_CARE_PROVIDER_SITE_OTHER): Payer: Medicaid Other | Admitting: Family Medicine

## 2011-04-26 VITALS — BP 133/85 | HR 122

## 2011-04-26 DIAGNOSIS — G8929 Other chronic pain: Secondary | ICD-10-CM
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
## 2011-05-03 ENCOUNTER — Ambulatory Visit (INDEPENDENT_AMBULATORY_CARE_PROVIDER_SITE_OTHER): Payer: Medicaid Other | Admitting: Family Medicine

## 2011-05-03 ENCOUNTER — Encounter: Payer: Self-pay | Admitting: Family Medicine

## 2011-05-03 DIAGNOSIS — I10 Essential (primary) hypertension: Secondary | ICD-10-CM

## 2011-05-03 DIAGNOSIS — G589 Mononeuropathy, unspecified: Secondary | ICD-10-CM

## 2011-05-03 DIAGNOSIS — G43019 Migraine without aura, intractable, without status migrainosus: Secondary | ICD-10-CM

## 2011-05-03 DIAGNOSIS — G90521 Complex regional pain syndrome I of right lower limb: Secondary | ICD-10-CM | POA: Insufficient documentation

## 2011-05-03 DIAGNOSIS — IMO0002 Reserved for concepts with insufficient information to code with codable children: Secondary | ICD-10-CM

## 2011-05-03 MED ORDER — PROPRANOLOL HCL ER 160 MG PO CP24: 160.0000 mg | ORAL_CAPSULE | Freq: Every day | ORAL | Status: DC

## 2011-05-03 MED ORDER — CLONIDINE HCL 0.1 MG/24HR TD PTWK: 1.0000 | MEDICATED_PATCH | TRANSDERMAL | Status: DC

## 2011-05-03 NOTE — Assessment & Plan Note (Signed)
Will discontinue Norvasc as pt continues to have left leg swelling, could be contributing.  Will try clonidine patch for BP lowering effects and chronic pain effects.

## 2011-05-03 NOTE — Assessment & Plan Note (Addendum)
Told patient this syndrome best fits her symptoms, best diagnosis at this time.  Discussed chronic pain syndromes and treatments.  Will add clonidine, see HTN.  Also, encouraged exercise, and will make referral to PT.  Also advised continuing gabapentin.

## 2011-05-03 NOTE — Patient Instructions (Signed)
It was good to see you, I am sorry you are still in so much pain.  I think you may have a pain syndrome called Complex Regional Pain syndrome. I would like you to stop taking your Norvasc (amlodipine) as it may be making your swelling worse.  I am going to start a new medication, a clonidine patch, which is a blood pressure medication that also helps with chronic pain.  Also, I am going to re-start your Propranolol for your migraines.  Keep taking the Topamax too. I also think you should keep taking gabapentin for those shooting leg pains.  Please keep trying to be active as this is very helpful.    I am going to make a referral for you to see Physical therapy, remember this is something that also helps this type of pain.  The office will call you with the appointment, if you do not hear about this within a week, please call our office to inquire about it.    Please come back and see me in about three months to see how you are doing.
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
## 2011-05-03 NOTE — Assessment & Plan Note (Signed)
Will re-start propranolol today.  Continue Topamax.

## 2011-07-01 ENCOUNTER — Telehealth: Payer: Self-pay | Admitting: Family Medicine

## 2011-07-01 NOTE — Telephone Encounter (Signed)
Pt comes to request Medical Release note for [employer]/ ?Possible Weight Lifting class. 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 Germani is on. Call for details.

## 2011-07-01 NOTE — Telephone Encounter (Signed)
Tried to reach patient to find out more details. Will try again tomorrow.   Kirsten Walsh 07/01/2011 4:36 PM

## 2011-07-06 NOTE — Telephone Encounter (Signed)
Attempted to contact patient again.  If she calls back, please let her know I am happy to write a note to clear her for exercise- but I do have a few questions for her before I write it.

## 2011-07-08 ENCOUNTER — Other Ambulatory Visit: Payer: Self-pay | Admitting: Family Medicine

## 2011-07-08 ENCOUNTER — Encounter: Payer: Self-pay | Admitting: Family Medicine

## 2011-07-08 MED ORDER — LISINOPRIL-HYDROCHLOROTHIAZIDE 20-12.5 MG PO TABS: 2.0000 | ORAL_TABLET | Freq: Every day | ORAL | Status: DC

## 2011-08-14 ENCOUNTER — Encounter (HOSPITAL_COMMUNITY): Payer: Self-pay | Admitting: *Deleted

## 2011-08-14 ENCOUNTER — Emergency Department (HOSPITAL_COMMUNITY): Payer: Medicaid Other

## 2011-08-14 ENCOUNTER — Emergency Department (HOSPITAL_COMMUNITY)
Admission: EM | Admit: 2011-08-14 | Discharge: 2011-08-14 | Disposition: A | Discharge status: Home / Self Care | Payer: Medicaid Other | Attending: Emergency Medicine | Admitting: Emergency Medicine

## 2011-08-14 ENCOUNTER — Other Ambulatory Visit: Payer: Self-pay

## 2011-08-14 DIAGNOSIS — I10 Essential (primary) hypertension: Secondary | ICD-10-CM | POA: Insufficient documentation

## 2011-08-14 DIAGNOSIS — R0609 Other forms of dyspnea: Secondary | ICD-10-CM | POA: Insufficient documentation

## 2011-08-14 DIAGNOSIS — R55 Syncope and collapse: Secondary | ICD-10-CM

## 2011-08-14 DIAGNOSIS — R0989 Other specified symptoms and signs involving the circulatory and respiratory systems: Secondary | ICD-10-CM | POA: Insufficient documentation

## 2011-08-14 DIAGNOSIS — R209 Unspecified disturbances of skin sensation: Secondary | ICD-10-CM | POA: Insufficient documentation

## 2011-08-14 DIAGNOSIS — G43909 Migraine, unspecified, not intractable, without status migrainosus: Secondary | ICD-10-CM | POA: Insufficient documentation

## 2011-08-14 DIAGNOSIS — T7840XA Allergy, unspecified, initial encounter: Secondary | ICD-10-CM | POA: Insufficient documentation

## 2011-08-14 DIAGNOSIS — X58XXXA Exposure to other specified factors, initial encounter: Secondary | ICD-10-CM | POA: Insufficient documentation

## 2011-08-14 HISTORY — DX: Essential (primary) hypertension: I10

## 2011-08-14 LAB — POCT I-STAT, CHEM 8
Calcium, Ion: 1.13 mmol/L (ref 1.12–1.32)
Chloride: 110 mEq/L (ref 96–112)
Glucose, Bld: 87 mg/dL (ref 70–99)
HCT: 41 % (ref 36.0–46.0)
Hemoglobin: 13.9 g/dL (ref 12.0–15.0)
TCO2: 22 mmol/L (ref 0–100)

## 2011-08-14 MED ORDER — EPINEPHRINE HCL 1 MG/ML IJ SOLN
INTRAMUSCULAR | Status: AC
  Filled 2011-08-14: qty 1

## 2011-08-14 MED ORDER — METOCLOPRAMIDE HCL 5 MG/ML IJ SOLN
10.0000 mg | Freq: Once | INTRAMUSCULAR | Status: AC
  Administered 2011-08-14: 10 mg via INTRAVENOUS
  Filled 2011-08-14: qty 2

## 2011-08-14 MED ORDER — PREDNISONE 20 MG PO TABS: 60.0000 mg | ORAL_TABLET | Freq: Once | ORAL | Status: DC

## 2011-08-14 MED ORDER — DIPHENHYDRAMINE HCL 12.5 MG/5ML PO ELIX
12.5000 mg | ORAL_SOLUTION | Freq: Once | ORAL | Status: AC
  Administered 2011-08-14: 12.5 mg via ORAL
  Filled 2011-08-14: qty 5

## 2011-08-14 MED ORDER — FAMOTIDINE IN NACL 20-0.9 MG/50ML-% IV SOLN
20.0000 mg | Freq: Once | INTRAVENOUS | Status: AC
  Administered 2011-08-14: 20 mg via INTRAVENOUS
  Filled 2011-08-14: qty 50

## 2011-08-14 MED ORDER — MORPHINE SULFATE 4 MG/ML IJ SOLN
4.0000 mg | Freq: Once | INTRAMUSCULAR | Status: AC
  Administered 2011-08-14: 4 mg via INTRAVENOUS
  Filled 2011-08-14: qty 1

## 2011-08-14 MED ORDER — KETOROLAC TROMETHAMINE 30 MG/ML IJ SOLN
30.0000 mg | Freq: Once | INTRAMUSCULAR | Status: AC
  Administered 2011-08-14: 30 mg via INTRAVENOUS
  Filled 2011-08-14: qty 1

## 2011-08-14 MED ORDER — PREDNISONE 10 MG PO TABS: 20.0000 mg | ORAL_TABLET | Freq: Every day | ORAL | Status: DC

## 2011-08-14 MED ORDER — SODIUM CHLORIDE 0.9 % IV SOLN
Freq: Once | INTRAVENOUS | Status: AC
  Administered 2011-08-14: 20 mL/h via INTRAVENOUS

## 2011-08-14 MED ORDER — FAMOTIDINE 20 MG PO TABS: ORAL_TABLET | ORAL | Status: DC

## 2011-08-14 MED ORDER — DEXAMETHASONE SODIUM PHOSPHATE 10 MG/ML IJ SOLN
10.0000 mg | Freq: Once | INTRAMUSCULAR | Status: AC
  Administered 2011-08-14: 10 mg via INTRAVENOUS
  Filled 2011-08-14: qty 1

## 2011-08-14 NOTE — Discharge Instructions (Signed)
Take prednisone and pepcid as prescribed.  Take 25mg  benadryl at the same time that you take the pepcid.  Keep your epi pen with you at all times.  Follow up with your family doctor.  You should return to the ER immediately if you have increased difficulty breathing.

## 2011-08-14 NOTE — ED Notes (Signed)
[id number]> Expected date:<BR> Expected time: 3:54 PM<BR> Means of arrival:<BR> Comments:<BR> M61 - 50yoF Allergic rxn to carrots, SOB.  EMS gave: benadryl, EPI, zantac

## 2011-08-14 NOTE — ED Notes (Signed)
Patient has severe allergy to carrots in a salad or a dip and she did not know.  Patient's respiratory is intact.  Throat feels scratchy.  Patient took 1 tablet of benadryl, was witnessed to have passed out for a brief seconds at the drug store.  Patient is tender to her left shoulder.  Alert and oriented x 3.  Patient took another benadryl for a total of 50mg  PTA.  Patient was given 0.3mg  of Epi and Zantac 50mg  per [facility].

## 2011-08-14 NOTE — ED Notes (Signed)
Patient and family verbalized complete understanding of discharge instructions.   

## 2011-08-14 NOTE — ED Provider Notes (Signed)
History     CSN: [id number]  Arrival date & time 08/14/11  1600   First MD Initiated Contact with Patient 08/14/11 1604      Chief Complaint  Patient presents with  . Allergic Reaction    (Consider location/radiation/quality/duration/timing/severity/associated sxs/prior treatment) HPI History provided by pt.   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  Ate spinach dip this afternoon and developed scratchiness in throat and tingling in her mouth shortly after.  Did not experience any lip/tongue edema or dyspnea but throat felt tight.  Went to the grocery store to buy benadryl and had a syncopal episode w/ prodrome of nausea and lightheadedness while standing in line.  Denies having CP, SOB, or palpitations but did have a typical migraine headache; has had syncopal episodes associated w/ her migraines in the past.  Pt has taken 50mg  benadryl and EMS gave her epinephrine as well as zantac en route to hospital.  Her sx are improved.   Past Medical History  Diagnosis Date  . Migraine   . Hypertension   . Migraine     History reviewed. No pertinent past surgical history.  No family history on file.  History  Substance Use Topics  . Smoking status: Never Smoker   . Smokeless tobacco: Never Used  . Alcohol Use: Not on file    OB History    Grav Para Term Preterm Abortions TAB SAB Ect Mult Living                  Review of Systems  All other systems reviewed and are negative.    Allergies  Aspirin  Home Medications   Current Outpatient Rx  Name Route Sig Dispense Refill  . CLONIDINE HCL 0.1 MG/24HR TD PTWK Transdermal Place 1 patch (0.1 mg total) onto the skin every 7 (seven) days. 4 patch 11  . DICLOFENAC SODIUM 75 MG PO TBEC Oral Take 75 mg by mouth 2 (two) times daily. For pain and infammation     . FLUTICASONE PROPIONATE 50 MCG/ACT NA SUSP Nasal Place 1 spray into the nose daily as needed. ALLERGIES    . LISINOPRIL-HYDROCHLOROTHIAZIDE 20-12.5 MG PO TABS Oral Take  2 tablets by mouth daily. 30 tablet 11  . ONDANSETRON HCL 4 MG PO TABS Oral Take 4 mg by mouth every 4 (four) hours as needed. For nausea     . PROPRANOLOL HCL ER 160 MG PO CP24 Oral Take 1 capsule (160 mg total) by mouth daily. 30 capsule 5  . TOPIRAMATE 50 MG PO TABS Oral Take 50 mg by mouth 2 (two) times daily.      BP 148/105  Pulse 71  Temp(Src) 98.9 F (37.2 C) (Oral)  Resp 25  SpO2 100%  Physical Exam  Nursing note and vitals reviewed. Constitutional: She is oriented to person, place, and time. She appears well-developed and well-nourished. No distress.  HENT:  Head: Normocephalic and atraumatic.  Mouth/Throat: Oropharynx is clear and moist and mucous membranes are normal. No posterior oropharyngeal edema.       No lip or tongue edema.  Eyes:       Normal appearance  Neck: Normal range of motion.       No meningeal signs  Cardiovascular: Normal rate and regular rhythm.   Pulmonary/Chest: Effort normal and breath sounds normal. No stridor. No respiratory distress.  Neurological: She is alert and oriented to person, place, and time. No cranial nerve deficit. Coordination normal.       5/5 and  equal upper and lower extremity strength.  No sensory deficits.  No past pointing.     Skin: Skin is warm and dry. No rash noted.  Psychiatric: She has a normal mood and affect. Her behavior is normal.    ED Course  Procedures (including critical care time)   Date: 08/14/2011  Rate: 61  Rhythm: normal sinus rhythm  QRS Axis: normal  Intervals: normal  ST/T Wave abnormalities: normal  Conduction Disutrbances:none  Narrative Interpretation:   Old EKG Reviewed: unchanged    Labs Reviewed  POCT I-STAT, CHEM 8   Ct Soft Tissue Neck Wo Contrast  08/14/2011  *RADIOLOGY REPORT*  Clinical Data: Allergic reaction to carrots. "Scratchy" feeling in the throat.  Difficulty breathing tonight.  CT NECK WITHOUT CONTRAST  Technique:  Multidetector CT imaging of the neck was performed  without intravenous contrast.  Comparison: None.  Findings: No visible laryngeal edema or radiopaque foreign body. Normal epiglottis.  Unremarkable subglottic region.  No visible neck masses.  Cervical spondylosis. Lung apices grossly clear.  No visible thyroid lesions.  No pathologic lymphadenopathy.  Early carotid calcification.  IMPRESSION: Unremarkable CT neck without contrast.  Original Report Authenticated By: Elsie Stain, M.D.     1. Syncope   2. Migraine   3. Allergic reaction       MDM  Pt w/ anaphylactic rxn to carrots accidentally ate carrots in a dip today.  Initially w/ tightness in throat as well as tingling in mouth but sx have improved since taking benadryl and receiving epi and zantac via EMS.  Also c/o migraine headache and syncopal episode at grocery store when purchasing her benadryl.  Has has syncope in setting of migraine in the past.  Prodrome of lightheadedness and no recent CP, SOB, palpitations.  On exam, pt in mild respiratory distress while talking, voice changes, no stridor, no lip/tongue edema, no focal neuro deficits or meningeal signs.  Pt receiving reglan and decadron for headache/allergic rxn.  EKG and i-stat ordered d/t syncope.   Pt reports that her symptoms continue to improve with the exception of her headache.  Reglan barely took the edge off.  She also has an anaphylactic reaction to aspirin so will avoid toradol.  4mg  IV morphine ordered.  Her voice is unchanged from initial exam.  EKG non-ischemic and shows sinus rhythm.  I-stat chem 8 unremarkable. Dr. Jeraldine Loots has seen patient and recommended CT neck to r/o vocal cord edema.  Results pending.  6:38 PM   CT neg for edema.  Results discussed w/ pt.  Continues to have voice changes but her husband reports that its improving.  Pt still feels a lump in her throat.  Will continue to monitor.  Morphine provided minimal relief of migraine.  Chart reviewed and she has received toradol on multiple occasions w/out  adverse effect.  Will try 30mg  IV toradol.  Dr. Jeraldine Loots will reassess and dispo shortly.          Otilio Miu, [place] 08/14/11 [phone]

## 2011-08-14 NOTE — ED Provider Notes (Signed)
Medical screening examination/treatment/procedure(s) were conducted as a shared visit with non-physician practitioner(s) and myself.  I personally evaluated the patient during the encounter This generally well-appearing female presents following a possible exposure to a known anaphylactic allergen.  On exam she has quite voice, but is in no distress, is not hypoxic, nor tachypneic.  The patient is no appreciable edema on visual inspection, nor on palpation of her throat.  The patient's CT does not demonstrate edema of her soft tissue.  The patient was observed for a number of hours in the emergency department had no recurrence of dyspnea, and noted a substantial improvement in her condition.  She was discharged in stable condition, with return precautions.  She has an EpiPen already.  Pulse oximetry 100% room air normal   Gerhard Munch, MD 08/14/11 2322

## 2011-08-14 NOTE — ED Notes (Signed)
PA & MD at bedside. 

## 2011-08-14 NOTE — ED Notes (Addendum)
Continuous cardiac monitor applied showing normal sinus rhythm, no ectopy. Continuous pulse oximetry applied. Automatic BP cuff applied.  Saline lock in place left inner wrist, site without redness or swelling as started by EMS prior to arrival.

## 2011-08-14 NOTE — ED Notes (Signed)
Patient c/o headache.  PA notified.

## 2011-08-14 NOTE — ED Notes (Signed)
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  No head injury reported, pt was given 0.107ml IM epinephrine and PO Zantac by EMS and has taken a total of 50mg  of PO Benadryl PTA.  Pt states her throat no longer has a scratching sensation, reports she still feels some difficulty swallowing but states she feels better.

## 2011-09-21 ENCOUNTER — Emergency Department (HOSPITAL_COMMUNITY)
Admission: EM | Admit: 2011-09-21 | Discharge: 2011-09-22 | Disposition: A | Discharge status: Home / Self Care | Payer: Medicaid Other | Attending: Emergency Medicine | Admitting: Emergency Medicine

## 2011-09-21 ENCOUNTER — Encounter (HOSPITAL_COMMUNITY): Payer: Self-pay | Admitting: Emergency Medicine

## 2011-09-21 ENCOUNTER — Other Ambulatory Visit: Payer: Self-pay

## 2011-09-21 DIAGNOSIS — IMO0001 Reserved for inherently not codable concepts without codable children: Secondary | ICD-10-CM | POA: Insufficient documentation

## 2011-09-21 DIAGNOSIS — M545 Low back pain, unspecified: Secondary | ICD-10-CM | POA: Insufficient documentation

## 2011-09-21 DIAGNOSIS — R5381 Other malaise: Secondary | ICD-10-CM | POA: Insufficient documentation

## 2011-09-21 DIAGNOSIS — R296 Repeated falls: Secondary | ICD-10-CM | POA: Insufficient documentation

## 2011-09-21 DIAGNOSIS — S339XXA Sprain of unspecified parts of lumbar spine and pelvis, initial encounter: Secondary | ICD-10-CM | POA: Insufficient documentation

## 2011-09-21 DIAGNOSIS — S39012A Strain of muscle, fascia and tendon of lower back, initial encounter: Secondary | ICD-10-CM

## 2011-09-21 DIAGNOSIS — Z79899 Other long term (current) drug therapy: Secondary | ICD-10-CM | POA: Insufficient documentation

## 2011-09-21 DIAGNOSIS — G43909 Migraine, unspecified, not intractable, without status migrainosus: Secondary | ICD-10-CM | POA: Insufficient documentation

## 2011-09-21 DIAGNOSIS — M79609 Pain in unspecified limb: Secondary | ICD-10-CM | POA: Insufficient documentation

## 2011-09-21 DIAGNOSIS — R55 Syncope and collapse: Secondary | ICD-10-CM | POA: Insufficient documentation

## 2011-09-21 DIAGNOSIS — M5380 Other specified dorsopathies, site unspecified: Secondary | ICD-10-CM | POA: Insufficient documentation

## 2011-09-21 DIAGNOSIS — R5383 Other fatigue: Secondary | ICD-10-CM | POA: Insufficient documentation

## 2011-09-21 DIAGNOSIS — I10 Essential (primary) hypertension: Secondary | ICD-10-CM | POA: Insufficient documentation

## 2011-09-21 DIAGNOSIS — M5430 Sciatica, unspecified side: Secondary | ICD-10-CM | POA: Insufficient documentation

## 2011-09-21 DIAGNOSIS — M2550 Pain in unspecified joint: Secondary | ICD-10-CM | POA: Insufficient documentation

## 2011-09-21 DIAGNOSIS — R42 Dizziness and giddiness: Secondary | ICD-10-CM | POA: Insufficient documentation

## 2011-09-21 LAB — URINALYSIS, ROUTINE W REFLEX MICROSCOPIC
Glucose, UA: NEGATIVE mg/dL
Ketones, ur: NEGATIVE mg/dL
Leukocytes, UA: NEGATIVE
Specific Gravity, Urine: 1.023 (ref 1.005–1.030)
pH: 7.5 (ref 5.0–8.0)

## 2011-09-21 LAB — DIFFERENTIAL
Lymphocytes Relative: 21 % (ref 12–46)
Lymphs Abs: 1.1 10*3/uL (ref 0.7–4.0)
Monocytes Relative: 5 % (ref 3–12)
Neutro Abs: 3.7 10*3/uL (ref 1.7–7.7)
Neutrophils Relative %: 72 % (ref 43–77)

## 2011-09-21 LAB — CBC
Hemoglobin: 12.5 g/dL (ref 12.0–15.0)
RBC: 4.09 MIL/uL (ref 3.87–5.11)
WBC: 5.1 10*3/uL (ref 4.0–10.5)

## 2011-09-21 LAB — BASIC METABOLIC PANEL
CO2: 19 mEq/L (ref 19–32)
Chloride: 106 mEq/L (ref 96–112)
Creatinine, Ser: 0.83 mg/dL (ref 0.50–1.10)

## 2011-09-21 LAB — PREGNANCY, URINE: Preg Test, Ur: NEGATIVE

## 2011-09-21 MED ORDER — SODIUM CHLORIDE 0.9 % IV BOLUS (SEPSIS)
1000.0000 mL | Freq: Once | INTRAVENOUS | Status: AC
  Administered 2011-09-21: 1000 mL via INTRAVENOUS

## 2011-09-21 MED ORDER — DIPHENHYDRAMINE HCL 50 MG/ML IJ SOLN
50.0000 mg | Freq: Once | INTRAMUSCULAR | Status: AC
  Administered 2011-09-21: 50 mg via INTRAVENOUS
  Filled 2011-09-21: qty 1

## 2011-09-21 MED ORDER — CYCLOBENZAPRINE HCL 10 MG PO TABS: 10.0000 mg | ORAL_TABLET | Freq: Three times a day (TID) | ORAL | Status: AC | PRN

## 2011-09-21 MED ORDER — KETOROLAC TROMETHAMINE 30 MG/ML IJ SOLN
30.0000 mg | Freq: Once | INTRAMUSCULAR | Status: AC
  Administered 2011-09-21: 30 mg via INTRAVENOUS
  Filled 2011-09-21: qty 1

## 2011-09-21 MED ORDER — DEXAMETHASONE SODIUM PHOSPHATE 10 MG/ML IJ SOLN
10.0000 mg | Freq: Once | INTRAMUSCULAR | Status: AC
  Administered 2011-09-21: 10 mg via INTRAVENOUS
  Filled 2011-09-21: qty 1

## 2011-09-21 MED ORDER — OXYCODONE-ACETAMINOPHEN 5-325 MG PO TABS: 2.0000 | ORAL_TABLET | ORAL | Status: AC | PRN

## 2011-09-21 MED ORDER — HYDROMORPHONE HCL PF 1 MG/ML IJ SOLN
1.0000 mg | Freq: Once | INTRAMUSCULAR | Status: AC
  Administered 2011-09-21: 1 mg via INTRAVENOUS
  Filled 2011-09-21: qty 1

## 2011-09-21 MED ORDER — METOCLOPRAMIDE HCL 5 MG/ML IJ SOLN
10.0000 mg | Freq: Once | INTRAMUSCULAR | Status: AC
  Administered 2011-09-21: 10 mg via INTRAVENOUS
  Filled 2011-09-21: qty 2

## 2011-09-21 MED ORDER — SODIUM CHLORIDE 0.9 % IV BOLUS (SEPSIS): 1000.0000 mL | Freq: Once | INTRAVENOUS | Status: DC

## 2011-09-21 NOTE — ED Notes (Signed)
Pts heart rate dropped down into the high 30's and low 40's. MD Brooke Dare made aware.

## 2011-09-21 NOTE — Discharge Instructions (Signed)
Sciatica Sciatica is a weakness and/or changes in sensation (tingling, jolts, hot and cold, numbness) along the path the sciatic nerve travels. Irritation or damage to lumbar nerve roots is often also referred to as lumbar radiculopathy.  Lumbar radiculopathy (Sciatica) is the most common form of this problem. Radiculopathy can occur in any of the nerves coming out of the spinal cord. The problems caused depend on which nerves are involved. The sciatic nerve is the large nerve supplying the branches of nerves going from the hip to the toes. It often causes a numbness or weakness in the skin and/or muscles that the sciatic nerve serves. It also may cause symptoms (problems) of pain, burning, tingling, or electric shock-like feelings in the path of this nerve. This usually comes from injury to the fibers that make up the sciatic nerve. Some of these symptoms are low back pain and/or unpleasant feelings in the following areas:  From the mid-buttock down the back of the leg to the back of the knee.   And/or the outside of the calf and top of the foot.   And/or behind the inner ankle to the sole of the foot.  CAUSES   Herniated or slipped disc. Discs are the little cushions between the bones in the back.   Pressure by the piriformis muscle in the buttock on the sciatic nerve (Piriformis Syndrome).   Misalignment of the bones in the lower back and buttocks (Sacroiliac Joint Derangement).   Narrowing of the spinal canal that puts pressure on or pinches the fibers that make up the sciatic nerve.   A slipped vertebra that is out of line with those above or beneath it.   Abnormality of the nervous system itself so that nerve fibers do not transmit signals properly, especially to feet and calves (neuropathy).   Tumor (this is rare).  Your caregiver can usually determine the cause of your sciatica and begin the treatment most likely to help you. TREATMENT  Taking over-the-counter painkillers, physical  therapy, rest, exercise, spinal manipulation, and injections of anesthetics and/or steroids may be used. Surgery, acupuncture, and Yoga can also be effective. Mind over matter techniques, mental imagery, and changing factors such as your bed, chair, desk height, posture, and activities are other treatments that may be helpful. You and your caregiver can help determine what is best for you. With proper diagnosis, the cause of most sciatica can be identified and removed. Communication and cooperation between your caregiver and you is essential. If you are not successful immediately, do not be discouraged. With time, a proper treatment can be found that will make you comfortable. HOME CARE INSTRUCTIONS   If the pain is coming from a problem in the back, applying ice to that area for 15 to 20 minutes, 3 to 4 times per day while awake, may be helpful. Put the ice in a plastic bag. Place a towel between the bag of ice and your skin.   You may exercise or perform your usual activities if these do not aggravate your pain, or as suggested by your caregiver.   Only take over-the-counter or prescription medicines for pain, discomfort, or fever as directed by your caregiver.   If your caregiver has given you a follow-up appointment, it is very important to keep that appointment. Not keeping the appointment could result in a chronic or permanent injury, pain, and disability. If there is any problem keeping the appointment, you must call back to this facility for assistance.  SEEK IMMEDIATE MEDICAL CARE   IF:   You experience loss of control of bowel or bladder.   You have increasing weakness in the trunk, buttocks, or legs.   There is numbness in any areas from the hip down to the toes.   You have difficulty walking or keeping your balance.   You have any of the above, with fever or forceful vomiting.  Document Released: 06/01/2001 Document Revised: 05/27/2011 Document Reviewed: 01/19/2008 [facility] Patient  Information 2012 [place], [place].  Syncope You have had a fainting (syncopal) spell. A fainting episode is a sudden, short-lived loss of consciousness. It results in complete recovery. It occurs because there has been a temporary shortage of oxygen and/or sugar (glucose) to the brain. CAUSES   Blood pressure pills and other medications that may lower blood pressure below normal. Sudden changes in posture (sudden standing).   Over-medication. Take your medications as directed.   Standing too long. This can cause blood to pool in the legs.   Seizure disorders.   Low blood sugar (hypoglycemia) of diabetes. This more commonly causes coma.   Bearing down to go to the bathroom. This can cause your blood pressure to rise suddenly. Your body compensates by making the blood pressure too low when you stop bearing down.   Hardening of the arteries where the brain temporarily does not receive enough blood.   Irregular heart beat and circulatory problems.   Fear, emotional distress, injury, sight of blood, or illness.  Your caregiver will send you home if the syncope was from non-worrisome causes (benign). Depending on your age and health, you may stay to be monitored and observed. If you return home, have someone stay with you if your caregiver feels that is desirable. It is very important to keep all follow-up referrals and appointments in order to properly manage this condition. This is a serious problem which can lead to serious illness and death if not carefully managed.  WARNING: Do not drive or operate machinery until your caregiver feels that it is safe for you to do so. SEEK IMMEDIATE MEDICAL CARE IF:   You have another fainting episode or faint while lying or sitting down. DO NOT DRIVE YOURSELF. Call 911 if no other help is available.   You have chest pain, are feeling sick to your stomach (nausea), vomiting or abdominal pain.   You have an irregular heartbeat or one that is very fast  (pulse over 120 beats per minute).   You have a loss of feeling in some part of your body or lose movement in your arms or legs.   You have difficulty with speech, confusion, severe weakness, or visual problems.   You become sweaty and/or feel light headed.  Make sure you are rechecked as instructed. Document Released: 06/07/2005 Document Revised: 05/27/2011 Document Reviewed: 01/26/2007 [facility] Patient Information 2012 [place], [place].
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
## 2011-09-21 NOTE — ED Provider Notes (Signed)
History     CSN: [id number]  Arrival date & time 09/21/11  1821   First MD Initiated Contact with Patient 09/21/11 2019      Chief Complaint  Patient presents with  . Loss of Consciousness  . Headache  . Nausea    (Consider location/radiation/quality/duration/timing/severity/associated sxs/prior treatment) HPI Comments: Consistent with prior HA.  Gradual in onset  Patient is a 51 y.o. female presenting with headaches. The history is provided by the patient. No language interpreter was used.  Headache  This is a recurrent problem. Episode onset: 2 weeks ago. The problem occurs constantly. The problem has been gradually worsening. The headache is associated with bright light and emotional stress. The pain is located in the right unilateral region. The quality of the pain is described as throbbing. The pain is moderate. Radiates to: has pain in her R arm and leg as well as the R lower back. Associated symptoms include malaise/fatigue and syncope (had a syncopal event today while visiting a friend). Pertinent negatives include no anorexia, no fever, no chest pressure, no palpitations, no shortness of breath, no nausea and no vomiting. She has tried nothing for the symptoms.    Past Medical History  Diagnosis Date  . Migraine   . Hypertension   . Migraine   . Kidney stones     Past Surgical History  Procedure Date  . Cholecystectomy     No family history on file.  History  Substance Use Topics  . Smoking status: Never Smoker   . Smokeless tobacco: Never Used  . Alcohol Use: Yes     occassionally    OB History    Grav Para Term Preterm Abortions TAB SAB Ect Mult Living                  Review of Systems  Constitutional: Positive for malaise/fatigue. Negative for fever, chills, activity change, appetite change and fatigue.  HENT: Negative for congestion, sore throat, rhinorrhea, neck pain and neck stiffness.   Respiratory: Negative for cough and shortness of breath.     Cardiovascular: Positive for syncope (had a syncopal event today while visiting a friend). Negative for chest pain and palpitations.  Gastrointestinal: Negative for nausea, vomiting, abdominal pain and anorexia.  Genitourinary: Negative for dysuria, urgency, frequency and flank pain.  Musculoskeletal: Positive for myalgias, back pain and arthralgias.  Neurological: Positive for light-headedness and headaches. Negative for dizziness, weakness and numbness.  All other systems reviewed and are negative.    Allergies  Aspirin  Home Medications   Current Outpatient Rx  Name Route Sig Dispense Refill  . CLONIDINE HCL 0.1 MG/24HR TD PTWK Transdermal Place 1 patch (0.1 mg total) onto the skin every 7 (seven) days. 4 patch 11  . DICLOFENAC SODIUM 75 MG PO TBEC Oral Take 75 mg by mouth 2 (two) times daily. For pain and infammation     . FLUTICASONE PROPIONATE 50 MCG/ACT NA SUSP Nasal Place 1 spray into the nose daily as needed. ALLERGIES    . LISINOPRIL-HYDROCHLOROTHIAZIDE 20-12.5 MG PO TABS Oral Take 2 tablets by mouth daily. 30 tablet 11  . ONDANSETRON HCL 4 MG PO TABS Oral Take 4 mg by mouth every 4 (four) hours as needed. For nausea     . TOPIRAMATE 50 MG PO TABS Oral Take 50 mg by mouth at bedtime.     . CYCLOBENZAPRINE HCL 10 MG PO TABS Oral Take 1 tablet (10 mg total) by mouth 3 (three) times daily as needed  for muscle spasms. 15 tablet 0  . OXYCODONE-ACETAMINOPHEN 5-325 MG PO TABS Oral Take 2 tablets by mouth every 4 (four) hours as needed for pain. 15 tablet 0    BP 131/68  Pulse 67  Temp(Src) 98.2 F (36.8 C) (Oral)  Resp 18  SpO2 100%  Physical Exam  Nursing note and vitals reviewed. Constitutional: She is oriented to person, place, and time. She appears well-developed and well-nourished. No distress.  HENT:  Head: Normocephalic and atraumatic.  Mouth/Throat: Oropharynx is clear and moist.  Eyes: Conjunctivae and EOM are normal. Pupils are equal, round, and reactive to  light.  Neck: Normal range of motion. Neck supple.  Cardiovascular: Normal rate, regular rhythm, normal heart sounds and intact distal pulses.  Exam reveals no gallop and no friction rub.   No murmur heard. Pulmonary/Chest: Effort normal and breath sounds normal. No respiratory distress.  Abdominal: Soft. Bowel sounds are normal. There is no tenderness.  Musculoskeletal:       Lumbar back: She exhibits decreased range of motion, tenderness, pain and spasm. She exhibits no bony tenderness.       Pain radiates into RLE  Neurological: She is alert and oriented to person, place, and time. She has normal strength and normal reflexes. No cranial nerve deficit or sensory deficit.  Skin: Skin is warm and dry. No rash noted.    ED Course  Procedures (including critical care time)   Date: 09/21/2011  Rate: 71  Rhythm: normal sinus rhythm  QRS Axis: normal  Intervals: normal  ST/T Wave abnormalities: normal  Conduction Disutrbances:none  Narrative Interpretation:   Old EKG Reviewed: unchanged  Labs Reviewed  BASIC METABOLIC PANEL - Abnormal; Notable for the following:    Sodium 134 (*)    GFR calc non Af Amer 82 (*)    All other components within normal limits  URINALYSIS, ROUTINE W REFLEX MICROSCOPIC - Abnormal; Notable for the following:    APPearance CLOUDY (*)    All other components within normal limits  PREGNANCY, URINE  CBC  DIFFERENTIAL   No results found.   1. Migraine headache   2. Lumbosacral strain   3. Sciatica   4. Vasovagal syncope       MDM  Migraine Headache, sciatica. The symptoms likely resulted in a vasovagal syncopal event. She received IV fluids, headache cocktail, pain medication. She had improvement of her symptoms. Repeat evaluation yielded a normal neurologic examination as well as improvement in her back pain and right leg pain. She is a dose of Decadron and was instructed to continue anti-inflammatory medications at home. Instructed to apply ice for  2 days and heat thereafter. Instructed to followup with her primary care physician. There is no concerning signs for back pain such as neurologic symptoms or loss of bowel or bladder function. Provided strict signs symptoms for which to return        Dayton Bailiff, MD 09/21/11 2345

## 2011-09-22 ENCOUNTER — Telehealth: Payer: Self-pay | Admitting: Family Medicine

## 2011-09-22 NOTE — Telephone Encounter (Signed)
Dr. Dalene Carrow (Oncologist who took care of patient's mother) called to discuss patient's complaint of R LE swelling. Patient has been evaluated for this in the past but feels that this is an ongoing problem that has not been fully addressed. She went to Dr. Dalene Carrow for assistance. Asked that the patient be evaluated.  Patient has not been to the clinic for this.

## 2011-09-23 ENCOUNTER — Encounter: Payer: Self-pay | Admitting: Family Medicine

## 2011-09-23 ENCOUNTER — Ambulatory Visit (INDEPENDENT_AMBULATORY_CARE_PROVIDER_SITE_OTHER): Payer: Medicaid Other | Admitting: Family Medicine

## 2011-09-23 VITALS — BP 160/103 | HR 69 | Temp 98.3°F | Ht 65.0 in | Wt 222.0 lb

## 2011-09-23 DIAGNOSIS — G589 Mononeuropathy, unspecified: Secondary | ICD-10-CM

## 2011-09-23 DIAGNOSIS — G8929 Other chronic pain: Secondary | ICD-10-CM

## 2011-09-23 DIAGNOSIS — G43019 Migraine without aura, intractable, without status migrainosus: Secondary | ICD-10-CM

## 2011-09-23 DIAGNOSIS — IMO0002 Reserved for concepts with insufficient information to code with codable children: Secondary | ICD-10-CM

## 2011-09-23 DIAGNOSIS — M7989 Other specified soft tissue disorders: Secondary | ICD-10-CM | POA: Insufficient documentation

## 2011-09-23 MED ORDER — PREGABALIN 50 MG PO CAPS: 50.0000 mg | ORAL_CAPSULE | Freq: Three times a day (TID) | ORAL | Status: DC

## 2011-09-23 MED ORDER — EPINEPHRINE 0.3 MG/0.3ML IJ DEVI: 0.3000 mg | Freq: Once | INTRAMUSCULAR | Status: DC

## 2011-09-23 MED ORDER — FLUTICASONE PROPIONATE 50 MCG/ACT NA SUSP: 1.0000 | Freq: Every day | NASAL | Status: DC | PRN
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
## 2011-09-23 NOTE — Patient Instructions (Signed)
I am sorry you are still hurting so badly and having swelling.  I am starting Lyrica for your nerve pain.  Also, I am referring you to Pain management for further evaluation and treatment of your pain.   I am also ordering an ECHO, an ultrasound of your heart to make sure you do not have heart failure causing your leg swelling.

## 2011-09-23 NOTE — Assessment & Plan Note (Signed)
I do not feel this is cardiac related, however, will order ECHO to ensure no CHF.

## 2011-09-23 NOTE — Assessment & Plan Note (Signed)
Patient with right sided pain, neuropathic most likely by its description.  She has tried Cymbalta, TCA's, gabapentin, none of which helped.

## 2011-09-23 NOTE — Assessment & Plan Note (Signed)
I feel this is the best diagnosis for Kirsten Walsh constellation of symptoms.  I have explained this to her before, but she still comes in wanting to know what's wrong.  I will refer her to pain management to see if they can help her improve her quality of life.

## 2011-09-23 NOTE — Assessment & Plan Note (Signed)
This is a long-standing problem, patient has been seen at headache clinic in past.  Currently on Topamax at a low dose.  Will refer to pain management as she owes $ at [facility] and does not want to go to headache clinic.

## 2011-09-27 ENCOUNTER — Encounter: Payer: Self-pay | Admitting: Physical Medicine & Rehabilitation

## 2011-09-30 ENCOUNTER — Ambulatory Visit (HOSPITAL_COMMUNITY)
Admission: RE | Admit: 2011-09-30 | Discharge: 2011-09-30 | Disposition: A | Discharge status: Home / Self Care | Payer: Medicaid Other | Source: Ambulatory Visit | Attending: Family Medicine | Admitting: Family Medicine

## 2011-09-30 DIAGNOSIS — R609 Edema, unspecified: Secondary | ICD-10-CM | POA: Insufficient documentation

## 2011-09-30 DIAGNOSIS — M7989 Other specified soft tissue disorders: Secondary | ICD-10-CM

## 2011-09-30 NOTE — Progress Notes (Signed)
  Echocardiogram 2D Echocardiogram has been performed.  Ethell Blatchford, Real Cons 09/30/2011, 12:03 PM

## 2011-10-05 ENCOUNTER — Encounter: Payer: Self-pay | Admitting: Family Medicine

## 2011-10-05 ENCOUNTER — Ambulatory Visit (INDEPENDENT_AMBULATORY_CARE_PROVIDER_SITE_OTHER): Payer: Medicaid Other | Admitting: *Deleted

## 2011-10-05 ENCOUNTER — Telehealth: Payer: Self-pay | Admitting: Family Medicine

## 2011-10-05 VITALS — BP 146/93 | HR 87

## 2011-10-05 DIAGNOSIS — I10 Essential (primary) hypertension: Secondary | ICD-10-CM

## 2011-10-05 DIAGNOSIS — R931 Abnormal findings on diagnostic imaging of heart and coronary circulation: Secondary | ICD-10-CM | POA: Insufficient documentation

## 2011-10-05 DIAGNOSIS — M7989 Other specified soft tissue disorders: Secondary | ICD-10-CM

## 2011-10-05 MED ORDER — METOPROLOL TARTRATE 12.5 MG HALF TABLET: 12.5000 mg | ORAL_TABLET | Freq: Two times a day (BID) | ORAL | Status: DC

## 2011-10-05 NOTE — Progress Notes (Signed)
Patient ID: Kirsten Walsh, female   DOB: [birth year], 51 y.o.   MRN: [id number] Patient came in to office requesting BP check.  Was seen recently by PCP for right leg numbness & tingling.   Still having burning in "glutes and behind right calf."  Also c/o right forearm pain below antecubital area.  Patient states she has appt with cardiologist on 10/20/11 and she will call for appt with PCP if pain worsens or if she develops any new sxs.  Gaylene Brooks, RN

## 2011-10-05 NOTE — Telephone Encounter (Signed)
Called and left message.  ECHO shows slightly decreased ejection fraction (hers is 45-50%, normal is 55%).  I told her that it may contribute to her leg swelling, but I do not think that it accounts for all the leg swelling.  I said in the message that I recommend a new medication to help- called metoprolol, Rx sent to pharmacy.  Also, I am referring her to cardiology. I will also send a letter.

## 2011-10-05 NOTE — Telephone Encounter (Signed)
Message copied by Ardyth Gal on Tue Oct 05, 2011  9:08 AM ------      Message from: Macy Mis      Created: Fri Oct 01, 2011 10:10 AM                   ----- Message -----         From: [facility] Interface         Sent: 09/30/2011   8:02 PM           To: Macy Mis, MD

## 2011-10-18 ENCOUNTER — Other Ambulatory Visit: Payer: Self-pay | Admitting: Family Medicine

## 2011-10-18 MED ORDER — TOPIRAMATE 50 MG PO TABS: 50.0000 mg | ORAL_TABLET | Freq: Every day | ORAL | Status: DC

## 2011-10-20 ENCOUNTER — Ambulatory Visit (INDEPENDENT_AMBULATORY_CARE_PROVIDER_SITE_OTHER): Payer: Medicaid Other | Admitting: Cardiovascular Disease

## 2011-10-20 ENCOUNTER — Encounter: Payer: Self-pay | Admitting: Cardiovascular Disease

## 2011-10-20 VITALS — BP 130/85 | HR 100 | Ht 65.0 in | Wt 204.8 lb

## 2011-10-20 DIAGNOSIS — I429 Cardiomyopathy, unspecified: Secondary | ICD-10-CM | POA: Insufficient documentation

## 2011-10-20 DIAGNOSIS — M7989 Other specified soft tissue disorders: Secondary | ICD-10-CM

## 2011-10-20 DIAGNOSIS — I428 Other cardiomyopathies: Secondary | ICD-10-CM

## 2011-10-20 DIAGNOSIS — R931 Abnormal findings on diagnostic imaging of heart and coronary circulation: Secondary | ICD-10-CM

## 2011-10-20 DIAGNOSIS — R9439 Abnormal result of other cardiovascular function study: Secondary | ICD-10-CM

## 2011-10-20 NOTE — Assessment & Plan Note (Addendum)
The patient reports unilateral leg swelling in the right side which is recurrent and not persistent. He does not seem to be significant on today's exam. This is likely due to chronic venous insufficiency. I do not think that the edema is related to the cardiac finding an echocardiogram. I reviewed her echocardiogram and a diastolic parameters were normal without evidence of elevated filling pressures or pulmonary hypertension. I recommend a venous duplex ultrasound on the right lower extremity to evaluate for possible DVT or incompetent valves. I cannot explain her other symptoms of simultaneous right leg, right arm and right face swelling.

## 2011-10-20 NOTE — Assessment & Plan Note (Signed)
The patient had a recent echocardiogram which showed an ejection fraction of 45-50% without significant valvular abnormalities. The patient has no evidence of clinical congestive heart failure. Her symptoms include dyspnea without chest pain. She has a known history of hypertension and this could be due to hypertensive heart disease with nonischemic cardiomyopathy. However, due to her symptoms and these new cardiac findings, underlying ischemia would need to be evaluated. I thus recommend proceeding with a treadmill nuclear stress test for further evaluation.  She was started recently on metoprolol. The dose can be subsequently increased which might also help her migraine. She is already on an ACE inhibitor.

## 2011-10-20 NOTE — Patient Instructions (Signed)
The current medical regimen is effective;  continue present plan and medications.  Your physician has requested that you have a lower extremity venous duplex. This test is an ultrasound of the veins in the legs or arms. It looks at venous blood flow that carries blood from the heart to the legs or arms. Allow one hour for a Lower Venous exam. Allow thirty minutes for an Upper Venous exam. There are no restrictions or special instructions.   Your physician has requested that you have a lexiscan myoview. For further information please visit [email or web address] Please follow instruction sheet, as given.  Follow up with Dr Kirke Corin after the testing

## 2011-10-20 NOTE — Progress Notes (Signed)
HPI  This is a 51 year old female who is referred for evaluation of lower extremity edema as well as an abnormal echocardiogram. She has no previous known cardiac history. She has prolonged history of hypertension and severe migraine. She gets headaches almost on a daily basis. She describes intermittent swelling in her right lower extremity but also sometimes in the right upper extremity at the same time which she describes as severe but usually subsides on its own and returned to normal. It's usually worse at the end of the day and mostly unilateral to the right side. Due to her edema, she underwent an echocardiogram which showed borderline reduced LV systolic function with an estimated ejection fraction of 45-50% without significant valvular abnormalities. The patient has no previous history of cardiomyopathy. She denies any previous coronary artery disease. She denies any chest pain. She does complain of dyspnea with moderate activities. There is no reported orthopnea, or PND.  Allergies  Allergen Reactions  . Aspirin Anaphylaxis     Current Outpatient Prescriptions on File Prior to Visit  Medication Sig Dispense Refill  . cloNIDine (CATAPRES - DOSED IN MG/24 HR) 0.1 mg/24hr patch Place 1 patch (0.1 mg total) onto the skin every 7 (seven) days.  4 patch  11  . diclofenac (VOLTAREN) 75 MG EC tablet Take 75 mg by mouth 2 (two) times daily. For pain and infammation       . EPINEPHrine (EPI-PEN) 0.3 mg/0.3 mL DEVI Inject 0.3 mLs (0.3 mg total) into the muscle once.  1 Device  2  . fluticasone (FLONASE) 50 MCG/ACT nasal spray Place 1 spray into the nose daily as needed. ALLERGIES  16 g  5  . lisinopril-hydrochlorothiazide (PRINZIDE,ZESTORETIC) 20-12.5 MG per tablet Take 2 tablets by mouth daily.  30 tablet  11  . metoprolol tartrate (LOPRESSOR) 12.5 mg TABS Take 0.5 tablets (12.5 mg total) by mouth 2 (two) times daily.  60 tablet  5  . ondansetron (ZOFRAN) 4 MG tablet Take 4 mg by mouth every 4  (four) hours as needed. For nausea       . pregabalin (LYRICA) 50 MG capsule Take 1 capsule (50 mg total) by mouth 3 (three) times daily.  90 capsule  2  . promethazine (PHENERGAN) 25 MG tablet Take 25 mg by mouth every 6 (six) hours as needed.      . topiramate (TOPAMAX) 50 MG tablet Take 1 tablet (50 mg total) by mouth at bedtime.  30 tablet  11     Past Medical History  Diagnosis Date  . Migraine   . Hypertension   . Migraine   . Kidney stones      Past Surgical History  Procedure Date  . Cholecystectomy      Family History  Problem Relation Age of Onset  . Cancer Mother   . Cirrhosis Father      History   Social History  . Marital Status: Single    Spouse Name: N/A    Number of Children: N/A  . Years of Education: N/A   Occupational History  . Not on file.   Social History Main Topics  . Smoking status: Never Smoker   . Smokeless tobacco: Never Used  . Alcohol Use: Yes     occassionally  . Drug Use: Not on file  . Sexually Active: Not on file   Other Topics Concern  . Not on file   Social History Narrative  . No narrative on file  ROS Constitutional: Negative for fever, chills, diaphoresis, activity change, appetite change and fatigue.  HENT: Negative for hearing loss, nosebleeds, congestion, sore throat, facial swelling, drooling, trouble swallowing, neck pain, voice change, sinus pressure and tinnitus.  Eyes: Negative for photophobia, pain, discharge and visual disturbance.  Respiratory: Negative for apnea, cough, chest tightness and wheezing.  Cardiovascular: Negative for chest pain. Gastrointestinal: Negative for nausea, vomiting, abdominal pain, diarrhea, constipation, blood in stool and abdominal distention.  Genitourinary: Negative for dysuria, urgency, frequency, hematuria and decreased urine volume.  Musculoskeletal: Negative for myalgias, back pain, joint swelling, arthralgias and gait problem.  Skin: Negative for color change, pallor,  rash and wound.  Neurological: Negative for dizziness, tremors, seizures, syncope, speech difficulty, weakness, light-headedness. Psychiatric/Behavioral: Negative for suicidal ideas, hallucinations, behavioral problems and agitation. The patient is nervous/anxious.     PHYSICAL EXAM   BP 130/85  Pulse 100  Ht 5\' 5"  (1.651 m)  Wt 204 lb 12.8 oz (92.897 kg)  BMI 34.08 kg/m2 Constitutional: She is oriented to person, place, and time. She appears well-developed and well-nourished. No distress.  HENT: No nasal discharge.  Head: Normocephalic and atraumatic.  Eyes: Pupils are equal and round. Right eye exhibits no discharge. Left eye exhibits no discharge.  Neck: Normal range of motion. Neck supple. No JVD present. No thyromegaly present.  Cardiovascular: Normal rate, regular rhythm, normal heart sounds. Exam reveals no gallop and no friction rub. No murmur heard.  Pulmonary/Chest: Effort normal and breath sounds normal. No stridor. No respiratory distress. She has no wheezes. She has no rales. She exhibits no tenderness.  Abdominal: Soft. Bowel sounds are normal. She exhibits no distension. There is no tenderness. There is no rebound and no guarding.  Musculoskeletal: Normal range of motion. She exhibits minimal swelling and right leg without tenderness.  Neurological: She is alert and oriented to person, place, and time. Coordination normal.  Skin: Skin is warm and dry. No rash noted. She is not diaphoretic. No erythema. No pallor.  Psychiatric: She has a normal mood and affect. Her behavior is normal. Judgment and thought content normal.     EKG: Recent EKG was reviewed and showed normal sinus rhythm with no significant ST or T wave changes.   ASSESSMENT AND PLAN

## 2011-11-04 ENCOUNTER — Emergency Department (HOSPITAL_COMMUNITY): Payer: Medicaid Other

## 2011-11-04 ENCOUNTER — Ambulatory Visit (HOSPITAL_COMMUNITY): Payer: Medicaid Other | Attending: Cardiology | Admitting: Radiology

## 2011-11-04 ENCOUNTER — Other Ambulatory Visit: Payer: Self-pay

## 2011-11-04 ENCOUNTER — Encounter (HOSPITAL_COMMUNITY): Payer: Self-pay

## 2011-11-04 ENCOUNTER — Emergency Department (HOSPITAL_COMMUNITY)
Admission: EM | Admit: 2011-11-04 | Discharge: 2011-11-04 | Disposition: A | Discharge status: Home / Self Care | Payer: Medicaid Other | Attending: Emergency Medicine | Admitting: Emergency Medicine

## 2011-11-04 VITALS — BP 124/82 | Ht 65.0 in | Wt 205.0 lb

## 2011-11-04 DIAGNOSIS — Z87442 Personal history of urinary calculi: Secondary | ICD-10-CM | POA: Insufficient documentation

## 2011-11-04 DIAGNOSIS — R0602 Shortness of breath: Secondary | ICD-10-CM

## 2011-11-04 DIAGNOSIS — I10 Essential (primary) hypertension: Secondary | ICD-10-CM | POA: Insufficient documentation

## 2011-11-04 DIAGNOSIS — R931 Abnormal findings on diagnostic imaging of heart and coronary circulation: Secondary | ICD-10-CM

## 2011-11-04 DIAGNOSIS — R55 Syncope and collapse: Secondary | ICD-10-CM | POA: Insufficient documentation

## 2011-11-04 DIAGNOSIS — R0989 Other specified symptoms and signs involving the circulatory and respiratory systems: Secondary | ICD-10-CM | POA: Insufficient documentation

## 2011-11-04 DIAGNOSIS — G43909 Migraine, unspecified, not intractable, without status migrainosus: Secondary | ICD-10-CM | POA: Insufficient documentation

## 2011-11-04 DIAGNOSIS — Z79899 Other long term (current) drug therapy: Secondary | ICD-10-CM | POA: Insufficient documentation

## 2011-11-04 DIAGNOSIS — R112 Nausea with vomiting, unspecified: Secondary | ICD-10-CM | POA: Insufficient documentation

## 2011-11-04 DIAGNOSIS — R42 Dizziness and giddiness: Secondary | ICD-10-CM | POA: Insufficient documentation

## 2011-11-04 DIAGNOSIS — R0609 Other forms of dyspnea: Secondary | ICD-10-CM | POA: Insufficient documentation

## 2011-11-04 DIAGNOSIS — I428 Other cardiomyopathies: Secondary | ICD-10-CM | POA: Insufficient documentation

## 2011-11-04 LAB — URINALYSIS, ROUTINE W REFLEX MICROSCOPIC
Glucose, UA: NEGATIVE mg/dL
Hgb urine dipstick: NEGATIVE
Specific Gravity, Urine: 1.008 (ref 1.005–1.030)
Urobilinogen, UA: 0.2 mg/dL (ref 0.0–1.0)

## 2011-11-04 LAB — POCT I-STAT, CHEM 8
Chloride: 110 mEq/L (ref 96–112)
HCT: 38 % (ref 36.0–46.0)
Potassium: 5.1 mEq/L (ref 3.5–5.1)

## 2011-11-04 LAB — PREGNANCY, URINE: Preg Test, Ur: NEGATIVE

## 2011-11-04 LAB — URINE MICROSCOPIC-ADD ON

## 2011-11-04 MED ORDER — HYDROMORPHONE HCL PF 1 MG/ML IJ SOLN
1.0000 mg | Freq: Once | INTRAMUSCULAR | Status: AC
  Administered 2011-11-04: 1 mg via INTRAVENOUS
  Filled 2011-11-04: qty 1

## 2011-11-04 MED ORDER — PROMETHAZINE HCL 25 MG/ML IJ SOLN
25.0000 mg | Freq: Four times a day (QID) | INTRAMUSCULAR | Status: DC | PRN
Start: 2011-11-04 — End: 2011-11-05
  Administered 2011-11-04: 25 mg via INTRAVENOUS
  Filled 2011-11-04: qty 1

## 2011-11-04 MED ORDER — SODIUM CHLORIDE 0.9 % IV BOLUS (SEPSIS)
1000.0000 mL | Freq: Once | INTRAVENOUS | Status: AC
  Administered 2011-11-04: 1000 mL via INTRAVENOUS

## 2011-11-04 MED ORDER — DIPHENHYDRAMINE HCL 50 MG/ML IJ SOLN
25.0000 mg | Freq: Once | INTRAMUSCULAR | Status: AC
  Administered 2011-11-04: 25 mg via INTRAVENOUS
  Filled 2011-11-04: qty 1

## 2011-11-04 MED ORDER — TECHNETIUM TC 99M TETROFOSMIN IV KIT
33.0000 | PACK | Freq: Once | INTRAVENOUS | Status: AC | PRN
  Administered 2011-11-04: 33 via INTRAVENOUS

## 2011-11-04 MED ORDER — OXYCODONE-ACETAMINOPHEN 5-325 MG PO TABS: 2.0000 | ORAL_TABLET | ORAL | Status: AC | PRN

## 2011-11-04 MED ORDER — ONDANSETRON HCL 4 MG/2ML IJ SOLN
INTRAMUSCULAR | Status: AC
  Administered 2011-11-04: 15:00:00
  Filled 2011-11-04: qty 4

## 2011-11-04 MED ORDER — DEXAMETHASONE SODIUM PHOSPHATE 10 MG/ML IJ SOLN
10.0000 mg | Freq: Once | INTRAMUSCULAR | Status: AC
  Administered 2011-11-04: 10 mg via INTRAVENOUS
  Filled 2011-11-04: qty 1

## 2011-11-04 MED ORDER — TECHNETIUM TC 99M TETROFOSMIN IV KIT
11.0000 | PACK | Freq: Once | INTRAVENOUS | Status: AC | PRN
  Administered 2011-11-04: 11 via INTRAVENOUS

## 2011-11-04 MED ORDER — METOCLOPRAMIDE HCL 5 MG/ML IJ SOLN
10.0000 mg | Freq: Once | INTRAMUSCULAR | Status: AC
  Administered 2011-11-04: 10 mg via INTRAVENOUS
  Filled 2011-11-04: qty 2

## 2011-11-04 NOTE — ED Provider Notes (Signed)
2030; Report received from [facility] .  Patient is here for migraine headache.  Will dispo when feeling better.  Dilaudid, benadryl, phenergan, reglan zofran and decadron has been given.  2200 Patient feels better and wants to go home.  Will dispo and discharge patient.  Requesting pain meds to go home with until she can get into her doctor.  Will give rx for a few percocet.  CT of the head unremarkable for acute process.  Labs unremarkable.  Labs Reviewed  URINALYSIS, ROUTINE W REFLEX MICROSCOPIC - Abnormal; Notable for the following:    APPearance CLOUDY (*)    Leukocytes, UA TRACE (*)    All other components within normal limits  URINE MICROSCOPIC-ADD ON - Abnormal; Notable for the following:    Squamous Epithelial / LPF FEW (*)    All other components within normal limits  POCT I-STAT, CHEM 8 - Abnormal; Notable for the following:    BUN 5 (*)    Glucose, Bld 110 (*)    All other components within normal limits  PREGNANCY, URINE  LAB REPORT - SCANNED    Remi Haggard, NP 11/05/11 1601

## 2011-11-04 NOTE — Progress Notes (Signed)
[facility] SITE 3 NUCLEAR MED [address] [place] [address] [phone]  Cardiology Nuclear Med Study  Kirsten Walsh is a 51 y.o. female     MRN : [id number]     DOB: Dec 08, 1960  Procedure Date: 11/04/2011  Nuclear Med Background Indication for Stress Test:  Evaluation for Ischemia History:  New Dx Cardiomyopathy ECHO: EF: 45-50% Cardiac Risk Factors: Hypertension  Symptoms:  DOE   Nuclear Pre-Procedure Caffeine/Decaff Intake:  None NPO After: 11:00pm   Lungs:  clear O2 Sat: 99/100% on room air. IV 0.9% NS with Angio Cath:  22g  IV Site: R Forearm  IV Started by:  Stanton Kidney, EMT-P  Chest Size (in):  40 Cup Size: D  Height: 5\' 5"  (1.651 m)  Weight:  205 lb (92.987 kg)  BMI:  Body mass index is 34.11 kg/(m^2). Tech Comments:  Toprol held > 24 hours, per patient. This patient walked on the treadmill, doesn't exercise frequently and really pushed herself.  This patient also has migraines on a regular basis. The patient vageled. She came clammy, nauseated, hypotensive for her, and chest tightness 3/10. The patient was given 500cc NS. The chest tightness went away. She had her pictures and they checked them showing no immediate problems. The patient remained very weak, chest tightness gone, and started dry heaving with nothing to come up.  Burna Mortimer Deal consulted with DOD S. Graciela Husbands. He advised to call 911 to send her to [facility]. EMS arrived and gave her some Zofran 4mg  IV, and transported to the ED.     Nuclear Med Study 1 or 2 day study: 1 day  Stress Test Type:  Stress  Reading MD: Marca Ancona, MD  Order Authorizing Provider: Judie Petit. Arida MD  Resting Radionuclide: Technetium 41m Tetrofosmin  Resting Radionuclide Dose: 11.0 mCi   Stress Radionuclide:  Technetium 43m Tetrofosmin  Stress Radionuclide Dose: 33.0 mCi           Stress Protocol Rest HR: 73 Stress HR: 173  Rest BP: 124/82 Stress BP: 148/83  Exercise Time (min): 8:00 METS: 10.10   Predicted Max  HR: 172 bpm % Max HR: 100.58 bpm Rate Pressure Product: 21308   Dose of Adenosine (mg):  n/a Dose of Lexiscan: n/a mg  Dose of Atropine (mg): n/a Dose of Dobutamine: n/a mcg/kg/min (at max HR)  Stress Test Technologist: Milana Na, EMT-P  Nuclear Technologist:  Domenic Polite, CNMT     Rest Procedure:  Myocardial perfusion imaging was performed at rest 45 minutes following the intravenous administration of Technetium 74m Tetrofosmin. Rest ECG: NSR - Normal EKG  Stress Procedure:  The patient performed treadmill exercise using a Bruce  Protocol for 8:00 minutes. The patient stopped due to fatigue,sob, and chest tightness.  There were non specific ST-T wave changes and a rare pvc.  Technetium 67m Tetrofosmin was injected at peak exercise and myocardial perfusion imaging was performed after a brief delay. Stress ECG: No significant change from baseline ECG  QPS Raw Data Images:  Normal; no motion artifact; normal heart/lung ratio. Stress Images:  Normal homogeneous uptake in all areas of the myocardium. Rest Images:  Normal homogeneous uptake in all areas of the myocardium. Subtraction (SDS):  There is no evidence of scar or ischemia. Transient Ischemic Dilatation (Normal <1.22):  0.98 Lung/Heart Ratio (Normal <0.45):  0.36  Quantitative Gated Spect Images QGS EDV:  98 ml QGS ESV:  37 ml  Impression Exercise Capacity:  Good exercise capacity. BP Response:  Normal  blood pressure response. Clinical Symptoms:  Chest tightness.  ECG Impression:  No significant ST segment change suggestive of ischemia. Comparison with Prior Nuclear Study: No images to compare  Overall Impression:  Normal stress nuclear study.  LV Ejection Fraction: 63%.  LV Wall Motion:  NL LV Function; NL Wall Motion  [employer]

## 2011-11-04 NOTE — ED Notes (Signed)
Pt was at [facility] doing Stress test, then BP went low, and headache started afterward, with nausea and vomiting.
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
## 2011-11-04 NOTE — Discharge Instructions (Signed)
Read the information below.  Please call your primary care provider tomorrow for a close follow up appointment.  Drink plenty of fluids over the next few days.  If you develop worsening headache, difficulty walking or speaking, new weakness or numbness of your arms or legs, or change in your vision, please return to the ER immediately.  You may return to the ER at any time for worsening condition or any new symptoms that concern you.   Migraine Headache A migraine headache is an intense, throbbing pain on one or both sides of your head. The exact cause of a migraine headache is not always known. A migraine may be caused when nerves in the brain become irritated and release chemicals that cause swelling within blood vessels, causing pain. Many migraine sufferers have a family history of migraines. Before you get a migraine you may or may not get an aura. An aura is a group of symptoms that can predict the beginning of a migraine. An aura may include:  Visual changes such as:   Flashing lights.   Bright spots or zig-zag lines.   Tunnel vision.   Feelings of numbness.   Trouble talking.   Muscle weakness.  SYMPTOMS  Pain on one or both sides of your head.   Pain that is pulsating or throbbing in nature.   Pain that is severe enough to prevent daily activities.   Pain that is aggravated by any daily physical activity.   Nausea (feeling sick to your stomach), vomiting, or both.   Pain with exposure to bright lights, loud noises, or activity.   General sensitivity to bright lights or loud noises.  MIGRAINE TRIGGERS Examples of triggers of migraine headaches include:   Alcohol.   Smoking.   Stress.   It may be related to menses (female menstruation).   Aged cheeses.   Foods or drinks that contain nitrates, glutamate, aspartame, or tyramine.   Lack of sleep.   Chocolate.   Caffeine.   Hunger.   Medications such as nitroglycerine (used to treat chest pain), birth  control pills, estrogen, and some blood pressure medications.  DIAGNOSIS  A migraine headache is often diagnosed based on:  Symptoms.   Physical examination.   A computerized X-ray scan (computed tomography, CT) of your head.  TREATMENT  Medications can help prevent migraines if they are recurrent or should they become recurrent. Your caregiver can help you with a medication or treatment program that will be helpful to you.   Lying down in a dark, quiet room may be helpful.   Keeping a headache diary may help you find a trend as to what may be triggering your headaches.  SEEK IMMEDIATE MEDICAL CARE IF:   You have confusion, personality changes or seizures.   You have headaches that wake you from sleep.   You have an increased frequency in your headaches.   You have a stiff neck.   You have a loss of vision.   You have muscle weakness.   You start losing your balance or have trouble walking.   You feel faint or pass out.  MAKE SURE YOU:   Understand these instructions.   Will watch your condition.   Will get help right away if you are not doing well or get worse.  Document Released: 06/07/2005 Document Revised: 05/27/2011 Document Reviewed: 01/21/2009 [facility] Patient Information 2012 [place], [place].   Nausea and Vomiting Nausea is a sick feeling that often comes before throwing up (vomiting). Vomiting is a  reflex where stomach contents come out of your mouth. Vomiting can cause severe loss of body fluids (dehydration). Children and elderly adults can become dehydrated quickly, especially if they also have diarrhea. Nausea and vomiting are symptoms of a condition or disease. It is important to find the cause of your symptoms. CAUSES   Direct irritation of the stomach lining. This irritation can result from increased acid production (gastroesophageal reflux disease), infection, food poisoning, taking certain medicines (such as nonsteroidal anti-inflammatory drugs),  alcohol use, or tobacco use.   Signals from the brain.These signals could be caused by a headache, heat exposure, an inner ear disturbance, increased pressure in the brain from injury, infection, a tumor, or a concussion, pain, emotional stimulus, or metabolic problems.   An obstruction in the gastrointestinal tract (bowel obstruction).   Illnesses such as diabetes, hepatitis, gallbladder problems, appendicitis, kidney problems, cancer, sepsis, atypical symptoms of a heart attack, or eating disorders.   Medical treatments such as chemotherapy and radiation.   Receiving medicine that makes you sleep (general anesthetic) during surgery.  DIAGNOSIS Your caregiver may ask for tests to be done if the problems do not improve after a few days. Tests may also be done if symptoms are severe or if the reason for the nausea and vomiting is not clear. Tests may include:  Urine tests.   Blood tests.   Stool tests.   Cultures (to look for evidence of infection).   X-rays or other imaging studies.  Test results can help your caregiver make decisions about treatment or the need for additional tests. TREATMENT You need to stay well hydrated. Drink frequently but in small amounts.You may wish to drink water, sports drinks, clear broth, or eat frozen ice pops or gelatin dessert to help stay hydrated.When you eat, eating slowly may help prevent nausea.There are also some antinausea medicines that may help prevent nausea. HOME CARE INSTRUCTIONS   Take all medicine as directed by your caregiver.   If you do not have an appetite, do not force yourself to eat. However, you must continue to drink fluids.   If you have an appetite, eat a normal diet unless your caregiver tells you differently.   Eat a variety of complex carbohydrates (rice, wheat, potatoes, bread), lean meats, yogurt, fruits, and vegetables.   Avoid high-fat foods because they are more difficult to digest.   Drink enough water and  fluids to keep your urine clear or pale yellow.   If you are dehydrated, ask your caregiver for specific rehydration instructions. Signs of dehydration may include:   Severe thirst.   Dry lips and mouth.   Dizziness.   Dark urine.   Decreasing urine frequency and amount.   Confusion.   Rapid breathing or pulse.  SEEK IMMEDIATE MEDICAL CARE IF:   You have blood or brown flecks (like coffee grounds) in your vomit.   You have black or bloody stools.   You have a severe headache or stiff neck.   You are confused.   You have severe abdominal pain.   You have chest pain or trouble breathing.   You do not urinate at least once every 8 hours.   You develop cold or clammy skin.   You continue to vomit for longer than 24 to 48 hours.   You have a fever.  MAKE SURE YOU:   Understand these instructions.   Will watch your condition.   Will get help right away if you are not doing well or get worse.  Document Released: 06/07/2005 Document Revised: 05/27/2011 Document Reviewed: 11/04/2010 [facility] Patient Information 2012 [place], [place].  Syncope You have had a fainting (syncopal) spell. A fainting episode is a sudden, short-lived loss of consciousness. It results in complete recovery. It occurs because there has been a temporary shortage of oxygen and/or sugar (glucose) to the brain. CAUSES   Blood pressure pills and other medications that may lower blood pressure below normal. Sudden changes in posture (sudden standing).   Over-medication. Take your medications as directed.   Standing too long. This can cause blood to pool in the legs.   Seizure disorders.   Low blood sugar (hypoglycemia) of diabetes. This more commonly causes coma.   Bearing down to go to the bathroom. This can cause your blood pressure to rise suddenly. Your body compensates by making the blood pressure too low when you stop bearing down.   Hardening of the arteries where the brain temporarily  does not receive enough blood.   Irregular heart beat and circulatory problems.   Fear, emotional distress, injury, sight of blood, or illness.  Your caregiver will send you home if the syncope was from non-worrisome causes (benign). Depending on your age and health, you may stay to be monitored and observed. If you return home, have someone stay with you if your caregiver feels that is desirable. It is very important to keep all follow-up referrals and appointments in order to properly manage this condition. This is a serious problem which can lead to serious illness and death if not carefully managed.  WARNING: Do not drive or operate machinery until your caregiver feels that it is safe for you to do so. SEEK IMMEDIATE MEDICAL CARE IF:   You have another fainting episode or faint while lying or sitting down. DO NOT DRIVE YOURSELF. Call 911 if no other help is available.   You have chest pain, are feeling sick to your stomach (nausea), vomiting or abdominal pain.   You have an irregular heartbeat or one that is very fast (pulse over 120 beats per minute).   You have a loss of feeling in some part of your body or lose movement in your arms or legs.   You have difficulty with speech, confusion, severe weakness, or visual problems.   You become sweaty and/or feel light headed.  Make sure you are rechecked as instructed. Document Released: 06/07/2005 Document Revised: 05/27/2011 Document Reviewed: 01/26/2007 [facility] Patient Information 2012 [place], [place].

## 2011-11-05 ENCOUNTER — Encounter (INDEPENDENT_AMBULATORY_CARE_PROVIDER_SITE_OTHER): Payer: Medicaid Other

## 2011-11-05 DIAGNOSIS — R609 Edema, unspecified: Secondary | ICD-10-CM

## 2011-11-05 DIAGNOSIS — M7989 Other specified soft tissue disorders: Secondary | ICD-10-CM

## 2011-11-05 NOTE — ED Provider Notes (Signed)
Medical screening examination/treatment/procedure(s) were performed by non-physician practitioner and as supervising physician I was immediately available for consultation/collaboration.  Shelda Jakes, MD 11/05/11 [phone]

## 2011-11-05 NOTE — ED Provider Notes (Signed)
Medical screening examination/treatment/procedure(s) were performed by non-physician practitioner and as supervising physician I was immediately available for consultation/collaboration.  Loren Racer, MD 11/05/11 1728

## 2011-11-10 ENCOUNTER — Encounter: Payer: Self-pay | Admitting: Cardiovascular Disease

## 2011-11-10 ENCOUNTER — Ambulatory Visit (INDEPENDENT_AMBULATORY_CARE_PROVIDER_SITE_OTHER): Payer: Medicaid Other | Admitting: Cardiovascular Disease

## 2011-11-10 VITALS — BP 129/79 | HR 88 | Ht 65.0 in | Wt 207.4 lb

## 2011-11-10 DIAGNOSIS — M7989 Other specified soft tissue disorders: Secondary | ICD-10-CM

## 2011-11-10 NOTE — Assessment & Plan Note (Signed)
The patient complains of right-sided swelling in both leg and arm which is intermittent and associated with numbness. The etiology of this is still not clear. I do not see any cardiac or vascular reason for this. Her ejection fraction was normal by a nuclear stress testing. She does not have cardiomyopathy. Venous duplex ultrasound showed no evidence of DVT or valve incompetence. No further cardiac testing is indicated. Due to the associated describe numbness, a neurologic evaluation might be needed.

## 2011-11-10 NOTE — Progress Notes (Signed)
HPI  This is a 51 year old female who is here today for a followup visit. She was seen here for right lower extremity edema. She had an echocardiogram done which suggested an ejection fraction of 45-50%. She underwent evaluation with a treadmill nuclear stress test which showed no evidence of ischemia with normal ejection fraction (EF 63%). She had a right lower extremity venous duplex ultrasound which showed no evidence of DVT or valve incompetence. Overall she has been doing reasonably well. She still suffers from frequent headaches due to her known history of migraine. Her blood pressure has been reasonably controlled.  Allergies  Allergen Reactions  . Aspirin Anaphylaxis     Current Outpatient Prescriptions on File Prior to Visit  Medication Sig Dispense Refill  . cloNIDine (CATAPRES - DOSED IN MG/24 HR) 0.1 mg/24hr patch Place 1 patch (0.1 mg total) onto the skin every 7 (seven) days.  4 patch  11  . EPINEPHrine (EPI-PEN) 0.3 mg/0.3 mL DEVI Inject 0.3 mLs (0.3 mg total) into the muscle once.  1 Device  2  . fluticasone (FLONASE) 50 MCG/ACT nasal spray Place 1 spray into the nose daily as needed. ALLERGIES  16 g  5  . lisinopril-hydrochlorothiazide (PRINZIDE,ZESTORETIC) 20-12.5 MG per tablet Take 2 tablets by mouth daily.  30 tablet  11  . ondansetron (ZOFRAN) 4 MG tablet Take 4 mg by mouth every 4 (four) hours as needed. For nausea       . oxyCODONE-acetaminophen (PERCOCET) 5-325 MG per tablet Take 2 tablets by mouth every 4 (four) hours as needed for pain.  6 tablet  0  . pregabalin (LYRICA) 50 MG capsule Take 1 capsule (50 mg total) by mouth 3 (three) times daily.  90 capsule  2  . promethazine (PHENERGAN) 25 MG suppository Place 25 mg rectally every 6 (six) hours as needed.      . promethazine (PHENERGAN) 25 MG tablet Take 25 mg by mouth every 6 (six) hours as needed.      . topiramate (TOPAMAX) 50 MG tablet Take 50 mg by mouth 2 (two) times daily.      . metoprolol tartrate  (LOPRESSOR) 12.5 mg TABS Take 0.5 tablets (12.5 mg total) by mouth 2 (two) times daily.  60 tablet  5     Past Medical History  Diagnosis Date  . Migraine   . Hypertension   . Migraine   . Kidney stones   . Chest pain      Past Surgical History  Procedure Date  . Cholecystectomy      Family History  Problem Relation Age of Onset  . Cancer Mother   . Cirrhosis Father      History   Social History  . Marital Status: Single    Spouse Name: N/A    Number of Children: N/A  . Years of Education: N/A   Occupational History  . Not on file.   Social History Main Topics  . Smoking status: Never Smoker   . Smokeless tobacco: Never Used  . Alcohol Use: Yes     occassionally  . Drug Use: Not on file  . Sexually Active: Not on file   Other Topics Concern  . Not on file   Social History Narrative  . No narrative on file     PHYSICAL EXAM   BP 129/79  Pulse 88  Ht 5\' 5"  (1.651 m)  Wt 207 lb 6.4 oz (94.076 kg)  BMI 34.51 kg/m2  Constitutional: She is oriented to  person, place, and time. She appears well-developed and well-nourished. No distress.  HENT: No nasal discharge.  Head: Normocephalic and atraumatic.  Eyes: Pupils are equal and round. Right eye exhibits no discharge. Left eye exhibits no discharge.  Neck: Normal range of motion. Neck supple. No JVD present. No thyromegaly present.  Cardiovascular: Normal rate, regular rhythm, normal heart sounds. Exam reveals no gallop and no friction rub. No murmur heard.  Pulmonary/Chest: Effort normal and breath sounds normal. No stridor. No respiratory distress. She has no wheezes. She has no rales. She exhibits no tenderness.  Abdominal: Soft. Bowel sounds are normal. She exhibits no distension. There is no tenderness. There is no rebound and no guarding.  Musculoskeletal: Normal range of motion. She exhibits no edema and no tenderness.  Neurological: She is alert and oriented to person, place, and time.  Coordination normal.  Skin: Skin is warm and dry. No rash noted. She is not diaphoretic. No erythema. No pallor.  Psychiatric: She has a normal mood and affect. Her behavior is normal. Judgment and thought content normal.      ASSESSMENT AND PLAN

## 2011-11-10 NOTE — Patient Instructions (Signed)
Your physician recommends that you schedule a follow-up appointment as needed  

## 2011-11-12 ENCOUNTER — Encounter: Payer: Medicaid Other | Attending: Physical Medicine & Rehabilitation | Admitting: Physical Medicine & Rehabilitation

## 2011-11-12 ENCOUNTER — Encounter: Payer: Self-pay | Admitting: Physical Medicine & Rehabilitation

## 2011-11-12 VITALS — BP 171/95 | HR 92 | Ht 65.0 in | Wt 204.0 lb

## 2011-11-12 DIAGNOSIS — R51 Headache: Secondary | ICD-10-CM

## 2011-11-12 DIAGNOSIS — IMO0002 Reserved for concepts with insufficient information to code with codable children: Secondary | ICD-10-CM

## 2011-11-12 DIAGNOSIS — M549 Dorsalgia, unspecified: Secondary | ICD-10-CM | POA: Insufficient documentation

## 2011-11-12 DIAGNOSIS — G43019 Migraine without aura, intractable, without status migrainosus: Secondary | ICD-10-CM

## 2011-11-12 DIAGNOSIS — M67919 Unspecified disorder of synovium and tendon, unspecified shoulder: Secondary | ICD-10-CM | POA: Insufficient documentation

## 2011-11-12 DIAGNOSIS — R52 Pain, unspecified: Secondary | ICD-10-CM

## 2011-11-12 DIAGNOSIS — G43709 Chronic migraine without aura, not intractable, without status migrainosus: Secondary | ICD-10-CM | POA: Insufficient documentation

## 2011-11-12 DIAGNOSIS — G589 Mononeuropathy, unspecified: Secondary | ICD-10-CM | POA: Insufficient documentation

## 2011-11-12 DIAGNOSIS — IMO0001 Reserved for inherently not codable concepts without codable children: Secondary | ICD-10-CM | POA: Insufficient documentation

## 2011-11-12 DIAGNOSIS — M719 Bursopathy, unspecified: Secondary | ICD-10-CM | POA: Insufficient documentation

## 2011-11-12 MED ORDER — TOPIRAMATE 100 MG PO TABS: 100.0000 mg | ORAL_TABLET | Freq: Two times a day (BID) | ORAL | Status: DC
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
## 2011-11-17 ENCOUNTER — Ambulatory Visit: Payer: Medicaid Other | Admitting: Physical Medicine & Rehabilitation

## 2011-11-23 ENCOUNTER — Ambulatory Visit: Payer: Medicaid Other

## 2011-11-24 ENCOUNTER — Ambulatory Visit: Payer: Medicaid Other | Attending: Physical Medicine & Rehabilitation | Admitting: Physical Therapy

## 2011-11-24 DIAGNOSIS — IMO0001 Reserved for inherently not codable concepts without codable children: Secondary | ICD-10-CM | POA: Insufficient documentation

## 2011-11-24 DIAGNOSIS — M2550 Pain in unspecified joint: Secondary | ICD-10-CM | POA: Insufficient documentation

## 2011-11-24 DIAGNOSIS — M2560 Stiffness of unspecified joint, not elsewhere classified: Secondary | ICD-10-CM | POA: Insufficient documentation

## 2011-12-02 ENCOUNTER — Ambulatory Visit: Payer: Medicaid Other | Admitting: Physical Therapy

## 2011-12-13 ENCOUNTER — Ambulatory Visit: Payer: Medicaid Other | Admitting: Physical Therapy

## 2011-12-15 ENCOUNTER — Ambulatory Visit: Payer: Medicaid Other | Admitting: Physical Therapy

## 2011-12-22 ENCOUNTER — Encounter: Payer: Medicaid Other | Admitting: Physical Therapy

## 2011-12-28 ENCOUNTER — Ambulatory Visit: Payer: Medicaid Other | Attending: Physical Medicine & Rehabilitation | Admitting: Physical Therapy

## 2011-12-28 DIAGNOSIS — M2560 Stiffness of unspecified joint, not elsewhere classified: Secondary | ICD-10-CM | POA: Insufficient documentation

## 2011-12-28 DIAGNOSIS — IMO0001 Reserved for inherently not codable concepts without codable children: Secondary | ICD-10-CM | POA: Insufficient documentation

## 2011-12-28 DIAGNOSIS — M2550 Pain in unspecified joint: Secondary | ICD-10-CM | POA: Insufficient documentation

## 2011-12-31 ENCOUNTER — Encounter (HOSPITAL_COMMUNITY): Payer: Self-pay | Admitting: Emergency Medicine

## 2011-12-31 ENCOUNTER — Encounter: Payer: Medicaid Other | Admitting: Physical Therapy

## 2011-12-31 ENCOUNTER — Emergency Department (HOSPITAL_COMMUNITY)
Admission: EM | Admit: 2011-12-31 | Discharge: 2011-12-31 | Disposition: A | Discharge status: Home / Self Care | Payer: Medicaid Other | Attending: Emergency Medicine | Admitting: Emergency Medicine

## 2011-12-31 DIAGNOSIS — Z79899 Other long term (current) drug therapy: Secondary | ICD-10-CM | POA: Insufficient documentation

## 2011-12-31 DIAGNOSIS — R42 Dizziness and giddiness: Secondary | ICD-10-CM | POA: Insufficient documentation

## 2011-12-31 DIAGNOSIS — I10 Essential (primary) hypertension: Secondary | ICD-10-CM | POA: Insufficient documentation

## 2011-12-31 DIAGNOSIS — G43909 Migraine, unspecified, not intractable, without status migrainosus: Secondary | ICD-10-CM | POA: Insufficient documentation

## 2011-12-31 DIAGNOSIS — R55 Syncope and collapse: Secondary | ICD-10-CM | POA: Insufficient documentation

## 2011-12-31 DIAGNOSIS — R209 Unspecified disturbances of skin sensation: Secondary | ICD-10-CM | POA: Insufficient documentation

## 2011-12-31 HISTORY — DX: Calculus of gallbladder without cholecystitis without obstruction: K80.20

## 2011-12-31 MED ORDER — HYDROMORPHONE HCL PF 1 MG/ML IJ SOLN
1.0000 mg | Freq: Once | INTRAMUSCULAR | Status: AC
  Administered 2011-12-31: 1 mg via INTRAMUSCULAR

## 2011-12-31 MED ORDER — HYDROMORPHONE HCL PF 1 MG/ML IJ SOLN
1.0000 mg | Freq: Once | INTRAMUSCULAR | Status: AC
  Administered 2011-12-31: 1 mg via INTRAMUSCULAR
  Filled 2011-12-31: qty 1

## 2011-12-31 MED ORDER — DIPHENHYDRAMINE HCL 50 MG/ML IJ SOLN
25.0000 mg | Freq: Once | INTRAMUSCULAR | Status: AC
  Administered 2011-12-31: 25 mg via INTRAVENOUS
  Filled 2011-12-31: qty 1

## 2011-12-31 MED ORDER — METOCLOPRAMIDE HCL 5 MG/ML IJ SOLN
10.0000 mg | Freq: Once | INTRAMUSCULAR | Status: AC
  Administered 2011-12-31: 10 mg via INTRAVENOUS
  Filled 2011-12-31: qty 2

## 2011-12-31 MED ORDER — SODIUM CHLORIDE 0.9 % IV BOLUS (SEPSIS)
1000.0000 mL | Freq: Once | INTRAVENOUS | Status: AC
  Administered 2011-12-31: 1000 mL via INTRAVENOUS

## 2011-12-31 MED ORDER — HYDROMORPHONE HCL PF 1 MG/ML IJ SOLN
1.0000 mg | Freq: Once | INTRAMUSCULAR | Status: DC
  Filled 2011-12-31: qty 1

## 2011-12-31 MED ORDER — DEXAMETHASONE SODIUM PHOSPHATE 10 MG/ML IJ SOLN
10.0000 mg | Freq: Once | INTRAMUSCULAR | Status: AC
  Administered 2011-12-31: 10 mg via INTRAVENOUS
  Filled 2011-12-31: qty 1

## 2011-12-31 MED ORDER — ONDANSETRON HCL 4 MG/2ML IJ SOLN
INTRAMUSCULAR | Status: AC
  Filled 2011-12-31: qty 2
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
## 2011-12-31 NOTE — ED Provider Notes (Addendum)
History     CSN: [id number]  Arrival date & time 12/31/11  1148   First MD Initiated Contact with Patient 12/31/11 1203      Chief Complaint  Patient presents with  . Migraine  . Near Syncope    (Consider location/radiation/quality/duration/timing/severity/associated sxs/prior treatment) HPI Comments: Patient with a history of chronic migraines and myofascial syndrome presents with a right-sided headache which she states feels similar to her past migraines. She states that she has a daily chronic headaches and it gets worse in intensity from time to time. She states that this headache has been getting worse over the last 3 days. She describes as a right-sided headache which is throbbing also has burning pain on her right shoulder and the right side of her body which is common for her she has some tingling in her right hand and right leg which she also states is chronic for her she's had a little bit of dizziness and vomiting today which she says is related to the headache. Denies any recent injuries or recent head injuries. Denies any fevers or chills. Denies any neck pain or stiffness. She takes Topamax for chronic daily headaches but doesn't take anything for breakthrough pain. He seen in family practice clinic for headache management  Patient is a 51 y.o. female presenting with migraine. The history is provided by the patient.  Migraine This is a recurrent problem. Associated symptoms include headaches. Pertinent negatives include no chest pain, no abdominal pain and no shortness of breath.    Past Medical History  Diagnosis Date  . Migraine   . Hypertension   . Migraine   . Kidney stones   . Chest pain   . Gall stones     Past Surgical History  Procedure Date  . Cholecystectomy   . Kidney stone surgery     Family History  Problem Relation Age of Onset  . Cancer Mother   . Cirrhosis Father     History  Substance Use Topics  . Smoking status: Never Smoker   .  Smokeless tobacco: Never Used  . Alcohol Use: Yes     occassionally    OB History    Grav Para Term Preterm Abortions TAB SAB Ect Mult Living                  Review of Systems  Constitutional: Negative for fever, chills, diaphoresis and fatigue.  HENT: Negative for congestion, rhinorrhea and sneezing.   Eyes: Negative.   Respiratory: Negative for cough, chest tightness and shortness of breath.   Cardiovascular: Negative for chest pain and leg swelling.  Gastrointestinal: Negative for nausea, vomiting, abdominal pain, diarrhea and blood in stool.  Genitourinary: Negative for frequency, hematuria, flank pain and difficulty urinating.  Musculoskeletal: Positive for myalgias. Negative for back pain and arthralgias.  Skin: Negative for rash.  Neurological: Positive for light-headedness, numbness and headaches. Negative for dizziness, speech difficulty and weakness.    Allergies  Aspirin; Apple; and Carrot  Home Medications   Current Outpatient Rx  Name Route Sig Dispense Refill  . CLONIDINE HCL 0.1 MG/24HR TD PTWK Transdermal Place 1 patch onto the skin once a week. Applied on Mondays.    Marland Kitchen FLUTICASONE PROPIONATE 50 MCG/ACT NA SUSP Nasal Place 1 spray into the nose daily as needed. ALLERGIES 16 g 5  . LISINOPRIL-HYDROCHLOROTHIAZIDE 20-12.5 MG PO TABS Oral Take 2 tablets by mouth daily. 30 tablet 11  . METOPROLOL TARTRATE 12.5 MG HALF TABLET Oral Take 0.5 tablets (  12.5 mg total) by mouth 2 (two) times daily. 60 tablet 5  . ONDANSETRON HCL 4 MG PO TABS Oral Take 4 mg by mouth every 4 (four) hours as needed. For nausea     . PREGABALIN 50 MG PO CAPS Oral Take 1 capsule (50 mg total) by mouth 3 (three) times daily. 90 capsule 2  . PROMETHAZINE HCL 25 MG RE SUPP Rectal Place 25 mg rectally every 6 (six) hours as needed.    . TOPIRAMATE 100 MG PO TABS Oral Take 1 tablet (100 mg total) by mouth 2 (two) times daily. 60 tablet 4  . EPINEPHRINE 0.3 MG/0.3ML IJ DEVI Intramuscular Inject 0.3  mLs (0.3 mg total) into the muscle once. 1 Device 2    BP 125/78  Pulse 98  Temp 99.3 F (37.4 C) (Oral)  Resp 16  SpO2 100%  Physical Exam  Constitutional: She is oriented to person, place, and time. She appears well-developed and well-nourished.  HENT:  Head: Normocephalic and atraumatic.  Eyes: Pupils are equal, round, and reactive to light.  Neck: Normal range of motion. Neck supple.  Cardiovascular: Normal rate, regular rhythm and normal heart sounds.   Pulmonary/Chest: Effort normal and breath sounds normal. No respiratory distress. She has no wheezes. She has no rales. She exhibits no tenderness.  Abdominal: Soft. Bowel sounds are normal. There is no tenderness. There is no rebound and no guarding.  Musculoskeletal: Normal range of motion. She exhibits no edema.  Lymphadenopathy:    She has no cervical adenopathy.  Neurological: She is alert and oriented to person, place, and time. She has normal strength. No cranial nerve deficit or sensory deficit. GCS eye subscore is 4. GCS verbal subscore is 5. GCS motor subscore is 6.       FTN intact  Skin: Skin is warm and dry. No rash noted.  Psychiatric: She has a normal mood and affect.    ED Course  Procedures (including critical care time)  Labs Reviewed - No data to display No results found.   1. Migraine       MDM  Pt with typical symptoms of her normal migraine.  Denies any unusual type of headache to suggest SAH, meningitis, or other intracranial pathology.  Improved with meds.  Still with headache, given one more shot of dilaudid prior to d/c        Rolan Bucco, MD 12/31/11 1525  Rolan Bucco, MD 12/31/11 1530

## 2011-12-31 NOTE — ED Notes (Signed)
Bed:WA23<BR> Expected date:<BR> Expected time:<BR> Means of arrival:<BR> Comments:<BR> ems

## 2012-01-04 ENCOUNTER — Ambulatory Visit: Payer: Medicaid Other | Admitting: Physical Therapy

## 2012-01-05 ENCOUNTER — Encounter: Payer: Medicaid Other | Admitting: Physical Therapy

## 2012-01-05 ENCOUNTER — Ambulatory Visit: Payer: Medicaid Other | Admitting: Physical Therapy

## 2012-01-06 ENCOUNTER — Encounter: Payer: Self-pay | Admitting: Family Medicine

## 2012-01-06 ENCOUNTER — Ambulatory Visit (INDEPENDENT_AMBULATORY_CARE_PROVIDER_SITE_OTHER): Payer: Medicaid Other | Admitting: Family Medicine

## 2012-01-06 VITALS — BP 140/98 | HR 137 | Temp 99.9°F | Ht 65.0 in | Wt 204.7 lb

## 2012-01-06 DIAGNOSIS — G43909 Migraine, unspecified, not intractable, without status migrainosus: Secondary | ICD-10-CM

## 2012-01-06 DIAGNOSIS — G43019 Migraine without aura, intractable, without status migrainosus: Secondary | ICD-10-CM

## 2012-01-06 MED ORDER — PROMETHAZINE HCL 25 MG/ML IJ SOLN
25.0000 mg | Freq: Once | INTRAMUSCULAR | Status: AC
  Administered 2012-01-06: 25 mg via INTRAMUSCULAR

## 2012-01-06 MED ORDER — ONDANSETRON HCL 4 MG PO TABS: 4.0000 mg | ORAL_TABLET | Freq: Three times a day (TID) | ORAL | Status: DC | PRN

## 2012-01-06 MED ORDER — SUMATRIPTAN SUCCINATE 6 MG/0.5ML ~~LOC~~ SOLN
6.0000 mg | Freq: Once | SUBCUTANEOUS | Status: AC
  Administered 2012-01-06: 6 mg via SUBCUTANEOUS

## 2012-01-06 MED ORDER — PROMETHAZINE HCL 25 MG RE SUPP: 25.0000 mg | Freq: Four times a day (QID) | RECTAL | Status: DC | PRN

## 2012-01-06 NOTE — Assessment & Plan Note (Signed)
A: chronic migraine status migrainous.  P:  -Imitrex 6 mg IM x 1 -phenergan 25 mg IM x 1 -refill home zofran and phenergan.

## 2012-01-06 NOTE — Patient Instructions (Addendum)
Ms. Byer,  Thank you for coming in today. I have refilled your zofran and phenergan.  Today we gave a dose of imitrex and phenergan IM.   The triptans are not a preferred drug if this is a therapy that you would like to have you will have to discuss this with your PCP.   Please call and come in if your symptoms persist, you develop weakness that is worse than your baseline or your develop fever.   Dr. Armen Pickup

## 2012-01-06 NOTE — Progress Notes (Signed)
Subjective:     Patient ID: Kirsten Walsh, female   DOB: 05-02-1961, 51 y.o.   MRN: [id number]  HPI 51 yo F presents for a same day appointment with a complaint of migraine. She has a history of chronic migraine, chronic pain and complex regional pain syndrome for which she sees pain management. She was seen in the ED on 12/31/11 (6 days ago) evaluated and treated with dilaudid. At the time of discharge her headache had improved slightly and she was able to sleep. She has had this same headaches for 9 days. It is R sided. Associated with nausea and emesis and scotoma. Patient is out of anti emetic. She denies worsening of her chronic R sided weakness, slurred speech, sensory changes and fever. She went to PT yesterday and is trying tape therapy.   Review of Systems As per HPI     Objective:   Physical Exam BP 140/98  Pulse 137  Temp 99.9 F (37.7 C) (Oral)  Ht 5\' 5"  (1.651 m)  Wt 204 lb 11.2 oz (92.851 kg)  BMI 34.06 kg/m2 General appearance: alert, cooperative and mild distress Head: Normocephalic, without obvious abnormality, atraumatic Eyes: conjunctivae/corneas clear. PERRL, EOM's intact. Throat: lips, mucosa, and tongue normal; teeth and gums normal Neck: no adenopathy, no carotid bruit, no JVD, supple, symmetrical, trachea midline and thyroid not enlarged, symmetric, no tenderness/mass/nodules. Head pain exacerbated for rotation of neck.  Extremities: extremities normal, atraumatic, no cyanosis or edema Pulses: 2+ and symmetric Neurologic: Alert and oriented X 3, normal strength and tone. Normal symmetric reflexes. Normal coordination and gait Strength 4/5 on R upper and lower extremity but this is baseline.   Assessment and Plan:

## 2012-01-11 ENCOUNTER — Encounter: Payer: Medicaid Other | Admitting: Physical Therapy

## 2012-01-12 ENCOUNTER — Ambulatory Visit: Payer: Medicaid Other | Admitting: Physical Therapy

## 2012-01-12 ENCOUNTER — Encounter: Payer: Medicaid Other | Admitting: Physical Therapy

## 2012-01-14 ENCOUNTER — Encounter: Payer: Medicaid Other | Admitting: Physical Therapy

## 2012-01-17 ENCOUNTER — Ambulatory Visit: Payer: Medicaid Other | Admitting: Physical Therapy

## 2012-01-19 ENCOUNTER — Ambulatory Visit: Payer: Medicaid Other | Admitting: Physical Therapy

## 2012-01-21 ENCOUNTER — Emergency Department (HOSPITAL_BASED_OUTPATIENT_CLINIC_OR_DEPARTMENT_OTHER)
Admission: EM | Admit: 2012-01-21 | Discharge: 2012-01-21 | Disposition: A | Discharge status: Home / Self Care | Payer: Medicaid Other | Attending: Emergency Medicine | Admitting: Emergency Medicine

## 2012-01-21 ENCOUNTER — Emergency Department (HOSPITAL_BASED_OUTPATIENT_CLINIC_OR_DEPARTMENT_OTHER): Payer: Medicaid Other

## 2012-01-21 ENCOUNTER — Encounter (HOSPITAL_BASED_OUTPATIENT_CLINIC_OR_DEPARTMENT_OTHER): Payer: Self-pay

## 2012-01-21 DIAGNOSIS — R55 Syncope and collapse: Secondary | ICD-10-CM | POA: Insufficient documentation

## 2012-01-21 DIAGNOSIS — S025XXA Fracture of tooth (traumatic), initial encounter for closed fracture: Secondary | ICD-10-CM | POA: Insufficient documentation

## 2012-01-21 DIAGNOSIS — S01501A Unspecified open wound of lip, initial encounter: Secondary | ICD-10-CM | POA: Insufficient documentation

## 2012-01-21 DIAGNOSIS — R51 Headache: Secondary | ICD-10-CM | POA: Insufficient documentation

## 2012-01-21 DIAGNOSIS — Z1832 Retained tooth: Secondary | ICD-10-CM | POA: Insufficient documentation

## 2012-01-21 DIAGNOSIS — I10 Essential (primary) hypertension: Secondary | ICD-10-CM | POA: Insufficient documentation

## 2012-01-21 DIAGNOSIS — T148XXA Other injury of unspecified body region, initial encounter: Secondary | ICD-10-CM

## 2012-01-21 DIAGNOSIS — W19XXXA Unspecified fall, initial encounter: Secondary | ICD-10-CM | POA: Insufficient documentation

## 2012-01-21 DIAGNOSIS — S01511A Laceration without foreign body of lip, initial encounter: Secondary | ICD-10-CM

## 2012-01-21 LAB — CBC WITH DIFFERENTIAL/PLATELET
Basophils Absolute: 0 10*3/uL (ref 0.0–0.1)
HCT: 41.2 % (ref 36.0–46.0)
Hemoglobin: 14.5 g/dL (ref 12.0–15.0)
Lymphocytes Relative: 28 % (ref 12–46)
Lymphs Abs: 1.9 10*3/uL (ref 0.7–4.0)
Monocytes Absolute: 0.8 10*3/uL (ref 0.1–1.0)
Monocytes Relative: 11 % (ref 3–12)
Neutro Abs: 4.1 10*3/uL (ref 1.7–7.7)
RBC: 4.58 MIL/uL (ref 3.87–5.11)
WBC: 6.9 10*3/uL (ref 4.0–10.5)

## 2012-01-21 LAB — BASIC METABOLIC PANEL
BUN: 10 mg/dL (ref 6–23)
CO2: 24 mEq/L (ref 19–32)
Chloride: 101 mEq/L (ref 96–112)
Creatinine, Ser: 1 mg/dL (ref 0.50–1.10)
Glucose, Bld: 121 mg/dL — ABNORMAL HIGH (ref 70–99)

## 2012-01-21 LAB — TROPONIN I: Troponin I: <0.3 ng/mL (ref <0.30)

## 2012-01-21 MED ORDER — SODIUM CHLORIDE 0.9 % IV SOLN: 8.0000 mg | Freq: Once | INTRAVENOUS | Status: DC

## 2012-01-21 MED ORDER — DIPHENHYDRAMINE HCL 50 MG/ML IJ SOLN
12.5000 mg | Freq: Once | INTRAMUSCULAR | Status: AC
  Administered 2012-01-21: 12.5 mg via INTRAVENOUS

## 2012-01-21 MED ORDER — CLINDAMYCIN HCL 150 MG PO CAPS: 150.0000 mg | ORAL_CAPSULE | Freq: Four times a day (QID) | ORAL | Status: AC

## 2012-01-21 MED ORDER — HYDROMORPHONE HCL PF 1 MG/ML IJ SOLN
1.0000 mg | Freq: Once | INTRAMUSCULAR | Status: AC
  Administered 2012-01-21: 1 mg via INTRAVENOUS

## 2012-01-21 MED ORDER — HYDROCODONE-ACETAMINOPHEN 5-325 MG PO TABS
2.0000 | ORAL_TABLET | Freq: Once | ORAL | Status: AC
  Administered 2012-01-21: 2 via ORAL
  Filled 2012-01-21: qty 2

## 2012-01-21 MED ORDER — HYDROMORPHONE HCL PF 1 MG/ML IJ SOLN
INTRAMUSCULAR | Status: AC
  Administered 2012-01-21: 1 mg via INTRAVENOUS
  Filled 2012-01-21: qty 1

## 2012-01-21 MED ORDER — HYDROCODONE-ACETAMINOPHEN 5-325 MG PO TABS: 2.0000 | ORAL_TABLET | ORAL | Status: AC | PRN

## 2012-01-21 MED ORDER — SODIUM CHLORIDE 0.9 % IV BOLUS (SEPSIS)
1000.0000 mL | Freq: Once | INTRAVENOUS | Status: AC
  Administered 2012-01-21: 1000 mL via INTRAVENOUS

## 2012-01-21 MED ORDER — ONDANSETRON 8 MG PO TBDP
8.0000 mg | ORAL_TABLET | Freq: Once | ORAL | Status: AC
  Administered 2012-01-21: 8 mg via ORAL
  Filled 2012-01-21: qty 1

## 2012-01-21 MED ORDER — LIDOCAINE HCL 2 % IJ SOLN
INTRAMUSCULAR | Status: AC
  Administered 2012-01-21: 20 mg
  Filled 2012-01-21: qty 1

## 2012-01-21 MED ORDER — DIPHENHYDRAMINE HCL 50 MG/ML IJ SOLN
INTRAMUSCULAR | Status: AC
  Administered 2012-01-21: 12.5 mg via INTRAVENOUS
  Filled 2012-01-21: qty 1

## 2012-01-21 NOTE — ED Notes (Signed)
Pt reports she fell today, sustained a dental injury with broken front tooth and lower lip laceration and had a LOC of unknown duration.

## 2012-01-21 NOTE — ED Provider Notes (Signed)
History     CSN: [id number]  Arrival date & time 01/21/12  1518   First MD Initiated Contact with Patient 01/21/12 1616      Chief Complaint  Patient presents with  . Dental Injury  . Fall  . Loss of Consciousness    (Consider location/radiation/quality/duration/timing/severity/associated sxs/prior treatment) Patient is a 51 y.o. female presenting with fall. The history is provided by the patient. No language interpreter was used.  Fall The accident occurred 6 to 12 hours ago. Incident: vomitting. She fell from a height of 3 to 5 ft. She landed on a hard floor. The volume of blood lost was minimal. The point of impact was the head. The pain is present in the head. The pain is at a severity of 4/10. The pain is moderate. She was ambulatory at the scene. There was no entrapment after the fall. She has tried rest for the symptoms. The treatment provided moderate relief.  Pt reports she has had a migrane.  Pt reports she passed out and broke her 2 top teeth.   Pt went to dentist and has had repaired.  Pt sent here for laceration to lip and evaluation.   Pt reports she has had multiple syncopal episodes due to migranes.    Past Medical History  Diagnosis Date  . Migraine   . Hypertension   . Migraine   . Kidney stones   . Chest pain   . Gall stones     Past Surgical History  Procedure Date  . Cholecystectomy   . Kidney stone surgery     Family History  Problem Relation Age of Onset  . Cancer Mother   . Cirrhosis Father     History  Substance Use Topics  . Smoking status: Never Smoker   . Smokeless tobacco: Never Used  . Alcohol Use: Yes     occassionally    OB History    Grav Para Term Preterm Abortions TAB SAB Ect Mult Living                  Review of Systems  Skin: Positive for wound.  All other systems reviewed and are negative.    Allergies  Aspirin; Apple; and Carrot  Home Medications   Current Outpatient Rx  Name Route Sig Dispense Refill  .  CLONIDINE HCL 0.1 MG/24HR TD PTWK Transdermal Place 1 patch onto the skin once a week. Applied on Mondays.    Marland Kitchen FLUTICASONE PROPIONATE 50 MCG/ACT NA SUSP Nasal Place 1 spray into the nose daily as needed. ALLERGIES 16 g 5  . LISINOPRIL-HYDROCHLOROTHIAZIDE 20-12.5 MG PO TABS Oral Take 2 tablets by mouth daily. 30 tablet 11  . METOPROLOL TARTRATE 12.5 MG HALF TABLET Oral Take 0.5 tablets (12.5 mg total) by mouth 2 (two) times daily. 60 tablet 5  . ONDANSETRON HCL 4 MG PO TABS Oral Take 1 tablet (4 mg total) by mouth every 8 (eight) hours as needed. For nausea 20 tablet 0  . PREGABALIN 50 MG PO CAPS Oral Take 1 capsule (50 mg total) by mouth 3 (three) times daily. 90 capsule 2  . TOPIRAMATE 100 MG PO TABS Oral Take 1 tablet (100 mg total) by mouth 2 (two) times daily. 60 tablet 4  . EPINEPHRINE 0.3 MG/0.3ML IJ DEVI Intramuscular Inject 0.3 mLs (0.3 mg total) into the muscle once. 1 Device 2    BP 108/78  Pulse 87  Temp 98 F (36.7 C) (Oral)  Resp 16  Ht 5\' 5"  (  1.651 m)  Wt 202 lb (91.627 kg)  BMI 33.61 kg/m2  SpO2 100%  Physical Exam  Nursing note and vitals reviewed. Constitutional: She is oriented to person, place, and time. She appears well-developed and well-nourished.  HENT:  Head: Normocephalic and atraumatic.  Eyes: Conjunctivae are normal. Pupils are equal, round, and reactive to light.  Neck: Normal range of motion.  Cardiovascular: Normal rate and regular rhythm.   Abdominal: Soft.  Musculoskeletal: Normal range of motion.  Neurological: She is alert and oriented to person, place, and time. She has normal reflexes.  Skin: Skin is warm.  Psychiatric: She has a normal mood and affect.    ED Course  Wound repair Date/Time: 01/21/2012 7:49 PM Performed by: Elson Areas Authorized by: Elson Areas Consent: Verbal consent obtained. Risks and benefits: risks, benefits and alternatives were discussed Patient understanding: patient states understanding of the procedure  being performed Required items: required blood products, implants, devices, and special equipment available Patient identity confirmed: verbally with patient Time out: Immediately prior to procedure a "time out" was called to verify the correct patient, procedure, equipment, support staff and site/side marked as required. Preparation: Patient was prepped and draped in the usual sterile fashion. Local anesthesia used: yes Anesthesia: local infiltration Comments: I removed a tooth fragment from 1cm laceration inner lip,   I irrigated wound copiously and explored.   I applied dermabond to out 5mm laceration for cosmetic closure.  I counseled pt at length on risk of infection.   Pt advised to recheck here in 2 days   (including critical care time)  Labs Reviewed  BASIC METABOLIC PANEL - Abnormal; Notable for the following:    Potassium 3.3 (*)     Glucose, Bld 121 (*)     GFR calc non Af Amer 66 (*)     GFR calc Af Amer 76 (*)     All other components within normal limits  CBC WITH DIFFERENTIAL  TROPONIN I   Ct Head Wo Contrast  01/21/2012  *RADIOLOGY REPORT*  Clinical Data:  Syncope.  Loss of consciousness.  Headache. Laceration.  Broken front teeth.  CT HEAD WITHOUT CONTRAST CT MAXILLOFACIAL WITHOUT CONTRAST  Technique:  Multidetector CT imaging of the head and maxillofacial structures were performed using the standard protocol without intravenous contrast. Multiplanar CT image reconstructions of the maxillofacial structures were also generated.  Comparison:  11/04/2011.  CT HEAD  Findings: No mass lesion, mass effect, midline shift, hydrocephalus, hemorrhage.  No territorial ischemia or acute infarction.  Calvarium intact.  Dermal calcifications are present in the forehead.  No change from prior.  IMPRESSION: Negative CT head.  CT MAXILLOFACIAL  Findings:   Pterygoid plates intact.  Mandibular condyles located. Zygomatic arches appear within normal limits.  Nasal bones appear normal. Left-sided  nasal septal spur.  Disconjugate gaze incidentally noted.  Paranasal sinuses are clear. Mastoid air cells clear.  Craniocervical junction appears within normal limits.  There are calcific fragments imbedded in the lower lip and mental soft tissues, best seen on sagittal images (image 54 series 7). This probably represent fragments of fractured teeth.  There is no mandibular fracture identified.  Maxillary and mandibular alveolar ridges appear intact.  There are cavities in the left mandibular and maxillary molars.  IMPRESSION: Calcific fragments imbedded in the lower lip and mental soft tissues, likely representing fractured tooth fragments.  Original Report Authenticated By: Andreas Newport, M.D.   Ct Maxillofacial Wo Cm  01/21/2012  *RADIOLOGY REPORT*  Clinical Data:  Syncope.  Loss of consciousness.  Headache. Laceration.  Broken front teeth.  CT HEAD WITHOUT CONTRAST CT MAXILLOFACIAL WITHOUT CONTRAST  Technique:  Multidetector CT imaging of the head and maxillofacial structures were performed using the standard protocol without intravenous contrast. Multiplanar CT image reconstructions of the maxillofacial structures were also generated.  Comparison:  11/04/2011.  CT HEAD  Findings: No mass lesion, mass effect, midline shift, hydrocephalus, hemorrhage.  No territorial ischemia or acute infarction.  Calvarium intact.  Dermal calcifications are present in the forehead.  No change from prior.  IMPRESSION: Negative CT head.  CT MAXILLOFACIAL  Findings:   Pterygoid plates intact.  Mandibular condyles located. Zygomatic arches appear within normal limits.  Nasal bones appear normal. Left-sided nasal septal spur.  Disconjugate gaze incidentally noted.  Paranasal sinuses are clear. Mastoid air cells clear.  Craniocervical junction appears within normal limits.  There are calcific fragments imbedded in the lower lip and mental soft tissues, best seen on sagittal images (image 54 series 7). This probably represent  fragments of fractured teeth.  There is no mandibular fracture identified.  Maxillary and mandibular alveolar ridges appear intact.  There are cavities in the left mandibular and maxillary molars.  IMPRESSION: Calcific fragments imbedded in the lower lip and mental soft tissues, likely representing fractured tooth fragments.  Original Report Authenticated By: Andreas Newport, M.D.     No diagnosis found.    MDM  Clindamycin hydrocodone,  Return here in 2 days for recheck        Lonia Skinner [place], [place] 01/21/12 1954

## 2012-01-21 NOTE — ED Notes (Signed)
Patient transported to CT. Pt stable and ready for transport.

## 2012-01-21 NOTE — ED Notes (Addendum)
Pt fell between 4-630 this am. Pt is unsure of the time. Positive LOC. Friend arrived and found her in a pool of blood. Blood had already dried. Pt has a broken front tooth and a lip laceration. Went to the dentist for the tooth repair however, they were unable to suture lip and sent her here. Reports head and mouth pain.

## 2012-01-21 NOTE — ED Provider Notes (Signed)
Medical screening examination/treatment/procedure(s) were performed by non-physician practitioner and as supervising physician I was immediately available for consultation/collaboration.   Gavin Pound. Oletta Lamas, MD 01/21/12 2005

## 2012-01-21 NOTE — ED Notes (Signed)
Pt also reports a headache 

## 2012-01-24 ENCOUNTER — Emergency Department (HOSPITAL_BASED_OUTPATIENT_CLINIC_OR_DEPARTMENT_OTHER)
Admission: EM | Admit: 2012-01-24 | Discharge: 2012-01-24 | Disposition: A | Discharge status: Home / Self Care | Payer: Medicaid Other | Attending: Emergency Medicine | Admitting: Emergency Medicine

## 2012-01-24 ENCOUNTER — Encounter (HOSPITAL_BASED_OUTPATIENT_CLINIC_OR_DEPARTMENT_OTHER): Payer: Self-pay | Admitting: *Deleted

## 2012-01-24 DIAGNOSIS — Z79899 Other long term (current) drug therapy: Secondary | ICD-10-CM | POA: Insufficient documentation

## 2012-01-24 DIAGNOSIS — R110 Nausea: Secondary | ICD-10-CM | POA: Insufficient documentation

## 2012-01-24 DIAGNOSIS — I10 Essential (primary) hypertension: Secondary | ICD-10-CM | POA: Insufficient documentation

## 2012-01-24 DIAGNOSIS — R51 Headache: Secondary | ICD-10-CM | POA: Insufficient documentation

## 2012-01-24 MED ORDER — PROMETHAZINE HCL 12.5 MG PO TABS
12.5000 mg | ORAL_TABLET | Freq: Four times a day (QID) | ORAL | Status: DC | PRN
Start: 2012-01-24 — End: 2012-01-31

## 2012-01-24 MED ORDER — METOCLOPRAMIDE HCL 5 MG/ML IJ SOLN
10.0000 mg | Freq: Once | INTRAMUSCULAR | Status: AC
  Administered 2012-01-24: 10 mg via INTRAVENOUS
  Filled 2012-01-24: qty 2

## 2012-01-24 MED ORDER — DIPHENHYDRAMINE HCL 50 MG/ML IJ SOLN
25.0000 mg | Freq: Once | INTRAMUSCULAR | Status: AC
  Administered 2012-01-24: 25 mg via INTRAVENOUS
  Filled 2012-01-24: qty 1

## 2012-01-24 MED ORDER — HYDROMORPHONE HCL PF 1 MG/ML IJ SOLN
1.0000 mg | Freq: Once | INTRAMUSCULAR | Status: AC
  Administered 2012-01-24: 1 mg via INTRAVENOUS
  Filled 2012-01-24: qty 1

## 2012-01-24 MED ORDER — DEXAMETHASONE SODIUM PHOSPHATE 10 MG/ML IJ SOLN
10.0000 mg | Freq: Once | INTRAMUSCULAR | Status: AC
  Administered 2012-01-24: 10 mg via INTRAVENOUS
  Filled 2012-01-24: qty 1

## 2012-01-24 MED ORDER — SODIUM CHLORIDE 0.9 % IV BOLUS (SEPSIS)
1000.0000 mL | Freq: Once | INTRAVENOUS | Status: AC
  Administered 2012-01-24: 1000 mL via INTRAVENOUS

## 2012-01-24 NOTE — ED Notes (Signed)
Labs were cancelled. 

## 2012-01-24 NOTE — ED Notes (Signed)
Migraine headache. Was seen here for infection in her lower lip a few days ago and has been unable to keep her antibiotics down due to vomiting with the head pain.

## 2012-01-24 NOTE — ED Notes (Signed)
Here for a recheck of a lip lac, 31 days old.  Also has a migraine with vomiting

## 2012-01-24 NOTE — ED Notes (Signed)
PA aware of difficulty obtaining labs, bolus infusing and will attempt again to recollect

## 2012-01-24 NOTE — ED Provider Notes (Signed)
Medical screening examination/treatment/procedure(s) were performed by non-physician practitioner and as supervising physician I was immediately available for consultation/collaboration.  Ethelda Chick, MD 01/24/12 2251

## 2012-01-24 NOTE — ED Provider Notes (Signed)
History     CSN: [id number]  Arrival date & time 01/24/12  1755   First MD Initiated Contact with Patient 01/24/12 1913      Chief Complaint  Patient presents with  . Migraine    (Consider location/radiation/quality/duration/timing/severity/associated sxs/prior treatment) HPI Comments: Patient presents with a headache after falling 3 days ago. After she fell, she was brought to the ED where she was evaluated and a head CT was performed. Her head CT was negative and she was discharged home with PO pain medication and antibiotics. Since then, she has been nauseous and has vomited twice per day for the past 2 days. She reports loss of appetite as well. She says this headache feels like her typical migraine but she is also having lingering soreness and pain from her fall. She reports neck pain and numbness tingling of right extremities that was present prior to her fall and is unrelated to her recent fall. She denies abdominal pain, fever, diarrhea.  Patient is a 51 y.o. female presenting with migraine.  Migraine Associated symptoms include chest pain, headaches, nausea, neck pain, vomiting and weakness. Pertinent negatives include no abdominal pain, chills, coughing, diaphoresis, fever or rash.    Past Medical History  Diagnosis Date  . Migraine   . Hypertension   . Migraine   . Kidney stones   . Chest pain   . Gall stones     Past Surgical History  Procedure Date  . Cholecystectomy   . Kidney stone surgery     Family History  Problem Relation Age of Onset  . Cancer Mother   . Cirrhosis Father     History  Substance Use Topics  . Smoking status: Never Smoker   . Smokeless tobacco: Never Used  . Alcohol Use: Yes     occassionally    OB History    Grav Para Term Preterm Abortions TAB SAB Ect Mult Living                  Review of Systems  Constitutional: Positive for appetite change. Negative for fever, chills and diaphoresis.  HENT: Positive for neck pain and  dental problem.   Eyes: Negative for photophobia and visual disturbance.  Respiratory: Negative for cough and shortness of breath.   Cardiovascular: Positive for chest pain.  Gastrointestinal: Positive for nausea and vomiting. Negative for abdominal pain, diarrhea and constipation.  Musculoskeletal: Negative for back pain.  Skin: Positive for wound. Negative for rash.  Neurological: Positive for dizziness, syncope, weakness and headaches.    Allergies  Apple; Aspirin; and Carrot  Home Medications   Current Outpatient Rx  Name Route Sig Dispense Refill  . CLINDAMYCIN HCL 150 MG PO CAPS Oral Take 1 capsule (150 mg total) by mouth every 6 (six) hours. 28 capsule 0  . CLONIDINE HCL 0.1 MG/24HR TD PTWK Transdermal Place 1 patch onto the skin once a week. Applied on Mondays.    Marland Kitchen EPINEPHRINE 0.3 MG/0.3ML IJ DEVI Intramuscular Inject 0.3 mg into the muscle once as needed. For allergic reaction    . FLUTICASONE PROPIONATE 50 MCG/ACT NA SUSP Nasal Place 1 spray into the nose 2 (two) times daily as needed. For allergies    . HYDROCODONE-ACETAMINOPHEN 5-325 MG PO TABS Oral Take 2 tablets by mouth every 4 (four) hours as needed for pain. 20 tablet 0  . LISINOPRIL-HYDROCHLOROTHIAZIDE 20-12.5 MG PO TABS Oral Take 2 tablets by mouth daily. 30 tablet 11  . METOPROLOL TARTRATE 25 MG PO TABS Oral  Take 12.5 mg by mouth 2 (two) times daily.    Marland Kitchen ONDANSETRON HCL 4 MG PO TABS Oral Take 1 tablet (4 mg total) by mouth every 8 (eight) hours as needed. For nausea 20 tablet 0  . PREGABALIN 50 MG PO CAPS Oral Take 1 capsule (50 mg total) by mouth 3 (three) times daily. 90 capsule 2  . PROMETHAZINE HCL 25 MG RE SUPP Rectal Place 25 mg rectally every 6 (six) hours as needed. For nausea    . TOPIRAMATE 100 MG PO TABS Oral Take 1 tablet (100 mg total) by mouth 2 (two) times daily. 60 tablet 4    BP 146/100  Pulse 83  Temp 98.9 F (37.2 C) (Oral)  Resp 20  Ht 5\' 5"  (1.651 m)  Wt 200 lb 7 oz (90.918 kg)  BMI  33.35 kg/m2  SpO2 100%  Physical Exam  Constitutional: She appears well-developed and well-nourished. No distress.  HENT:  Head: Normocephalic.  Mouth/Throat: Oropharynx is clear and moist. No oropharyngeal exudate.       Apparent swollen lower lip with a healed laceration related to recent fall. No other evidence of head trauma.   Eyes: Conjunctivae are normal. Pupils are equal, round, and reactive to light. No scleral icterus.       Patient retracted when the light was shone in her eyes due to sensitivity.   Neck:       Lateral neck ROM limited due to previous injury.   Cardiovascular: Normal rate and regular rhythm.  Exam reveals no gallop and no friction rub.   No murmur heard. Pulmonary/Chest: Effort normal and breath sounds normal. No respiratory distress. She has no wheezes. She has no rales. She exhibits tenderness.       Sternal tenderness to palpation due to recent fall.   Abdominal: Soft. She exhibits no distension. There is no tenderness. There is no rebound.  Neurological: She is alert. No cranial nerve deficit.  Skin: Skin is warm and dry. No rash noted. She is not diaphoretic.  Psychiatric: She has a normal mood and affect. Her behavior is normal.    ED Course  Procedures (including critical care time)   Labs Reviewed  CBC WITH DIFFERENTIAL  BASIC METABOLIC PANEL   No results found.   No diagnosis found.    MDM  7:49 PM Patient is experiencing headache with nausea. I will treat her with a migraine cocktail and reassess her nausea. I will not repeat her head scan due to negative results from 3 days ago.   9:33 PM Labs unable to be drawn due to patient's veins and she is refusing to be stuck anymore. Patient is still experiencing headache after migraine cocktail and fluids. I will discuss with Dr. Karma Ganja for next step.    10:05 PM Patient will be discharged and encouraged to follow up with PCP for further evaluation of headache. No further evaluation is  warranted at this time. The plan has been discussed with Dr. Karma Ganja who is agreeable.     Emilia Beck, PA-C 01/24/12 2206

## 2012-01-24 NOTE — ED Notes (Signed)
IV attempted x2- unsuccessful. 

## 2012-01-26 ENCOUNTER — Ambulatory Visit: Payer: Medicaid Other | Attending: Family Medicine | Admitting: Physical Therapy

## 2012-01-26 DIAGNOSIS — IMO0001 Reserved for inherently not codable concepts without codable children: Secondary | ICD-10-CM | POA: Insufficient documentation

## 2012-01-26 DIAGNOSIS — M2560 Stiffness of unspecified joint, not elsewhere classified: Secondary | ICD-10-CM | POA: Insufficient documentation

## 2012-01-26 DIAGNOSIS — M2550 Pain in unspecified joint: Secondary | ICD-10-CM | POA: Insufficient documentation

## 2012-01-27 ENCOUNTER — Ambulatory Visit: Payer: Medicaid Other | Admitting: Rehabilitative and Restorative Service Providers"

## 2012-01-31 ENCOUNTER — Ambulatory Visit: Payer: Medicaid Other | Admitting: Rehabilitative and Restorative Service Providers"

## 2012-01-31 ENCOUNTER — Ambulatory Visit (INDEPENDENT_AMBULATORY_CARE_PROVIDER_SITE_OTHER): Payer: Medicaid Other | Admitting: Family Medicine

## 2012-01-31 ENCOUNTER — Encounter: Payer: Self-pay | Admitting: Family Medicine

## 2012-01-31 VITALS — BP 116/84 | HR 91 | Temp 98.3°F | Ht 65.0 in | Wt 201.0 lb

## 2012-01-31 DIAGNOSIS — G589 Mononeuropathy, unspecified: Secondary | ICD-10-CM

## 2012-01-31 DIAGNOSIS — G43019 Migraine without aura, intractable, without status migrainosus: Secondary | ICD-10-CM

## 2012-01-31 DIAGNOSIS — IMO0002 Reserved for concepts with insufficient information to code with codable children: Secondary | ICD-10-CM

## 2012-01-31 NOTE — Patient Instructions (Signed)
I am sorry you are still having difficulty from your headaches, arm and leg pain, and swelling.  I am referring your to a Neurologist, we will try Dr. Modesto Charon at [place].  Please continue your physical therapy.
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
## 2012-02-01 NOTE — Assessment & Plan Note (Addendum)
Patient has not accepted diagnosis, which I think is interfering with her treatment.  She says she just wants some relief, but seems to not get along with every doctor that she has been referred to for both her headaches and her pain syndrome.  I told her today that this is my diagnosis, and I do not feel any further tests would tell us anything new.  Will refer to a new neurologist, continue PT as it does seem to be helping some.   Please note after the visit, which lasted about 60 minutes, the patient returned to speak with me again.  She says she left and became concerned that I said I was not ordering any more tests, and said she was worried I meant tests about anything, and that I was no longer going to help her.  She said she thought about calling her lawyer, but decided to come back in to clarify.  I spent another 15 minutes or so reiterating that I feel this is her diagnosis, a through work up has been done, and that I do not think any more tests to try to find the cause of her pain are necessary.   I have spent an additional 30 minutes reviewing Ms. Vanderbeck's chart to sort out what happened at the Pain clinic.  Upon looking at her after visit summary from that visit, I see that there were "facility administered medications," injections, from a prior visit, which was technetium tetrofosmin, an injection she had done two days prior to her visit with Dr. Riley Kill, for her stress Myoview.

## 2012-02-02 ENCOUNTER — Ambulatory Visit: Payer: Medicaid Other | Admitting: Rehabilitative and Restorative Service Providers"

## 2012-02-08 ENCOUNTER — Encounter: Payer: Medicaid Other | Admitting: Physical Therapy

## 2012-02-10 ENCOUNTER — Encounter: Payer: Medicaid Other | Admitting: Physical Therapy

## 2012-03-15 ENCOUNTER — Telehealth: Payer: Self-pay | Admitting: Family Medicine

## 2012-03-15 ENCOUNTER — Ambulatory Visit (INDEPENDENT_AMBULATORY_CARE_PROVIDER_SITE_OTHER): Payer: Medicaid Other | Admitting: Family Medicine

## 2012-03-15 VITALS — BP 137/99 | HR 110 | Temp 98.5°F | Ht 65.0 in | Wt 198.0 lb

## 2012-03-15 DIAGNOSIS — G43019 Migraine without aura, intractable, without status migrainosus: Secondary | ICD-10-CM

## 2012-03-15 DIAGNOSIS — G43909 Migraine, unspecified, not intractable, without status migrainosus: Secondary | ICD-10-CM

## 2012-03-15 MED ORDER — ONDANSETRON 4 MG PO TBDP: 4.0000 mg | ORAL_TABLET | Freq: Four times a day (QID) | ORAL | Status: DC | PRN

## 2012-03-15 MED ORDER — PROMETHAZINE HCL 25 MG/ML IJ SOLN
25.0000 mg | Freq: Once | INTRAMUSCULAR | Status: AC
  Administered 2012-03-15: 25 mg via INTRAMUSCULAR

## 2012-03-15 MED ORDER — SUMATRIPTAN SUCCINATE 6 MG/0.5ML ~~LOC~~ SOLN
6.0000 mg | Freq: Once | SUBCUTANEOUS | Status: AC
  Administered 2012-03-15: 6 mg via SUBCUTANEOUS

## 2012-03-15 MED ORDER — DEXAMETHASONE SODIUM PHOSPHATE 10 MG/ML IJ SOLN
10.0000 mg | Freq: Once | INTRAMUSCULAR | Status: AC
  Administered 2012-03-15: 10 mg via INTRAMUSCULAR

## 2012-03-15 NOTE — Patient Instructions (Addendum)
Take motrin 800 mg by mouth every 8 hours as needed for pain. Use Zofran oral dissolving tablet for nausea. Drink plenty of fluids.  Migraine Headache A migraine headache is an intense, throbbing pain on one or both sides of your head. The exact cause of a migraine headache is not always known. A migraine may be caused when nerves in the brain become irritated and release chemicals that cause swelling within blood vessels, causing pain. Many migraine sufferers have a family history of migraines. Before you get a migraine you may or may not get an aura. An aura is a group of symptoms that can predict the beginning of a migraine. An aura may include:  Visual changes such as:   Flashing lights.   Bright spots or zig-zag lines.   Tunnel vision.   Feelings of numbness.   Trouble talking.   Muscle weakness.  SYMPTOMS  Pain on one or both sides of your head.   Pain that is pulsating or throbbing in nature.   Pain that is severe enough to prevent daily activities.   Pain that is aggravated by any daily physical activity.   Nausea (feeling sick to your stomach), vomiting, or both.   Pain with exposure to bright lights, loud noises, or activity.   General sensitivity to bright lights or loud noises.  MIGRAINE TRIGGERS Examples of triggers of migraine headaches include:   Alcohol.   Smoking.   Stress.   It may be related to menses (female menstruation).   Aged cheeses.   Foods or drinks that contain nitrates, glutamate, aspartame, or tyramine.   Lack of sleep.   Chocolate.   Caffeine.   Hunger.   Medications such as nitroglycerine (used to treat chest pain), birth control pills, estrogen, and some blood pressure medications.  DIAGNOSIS  A migraine headache is often diagnosed based on:  Symptoms.   Physical examination.   A computerized X-ray scan (computed tomography, CT) of your head.  TREATMENT  Medications can help prevent migraines if they are recurrent or  should they become recurrent. Your caregiver can help you with a medication or treatment program that will be helpful to you.   Lying down in a dark, quiet room may be helpful.   Keeping a headache diary may help you find a trend as to what may be triggering your headaches.  SEEK IMMEDIATE MEDICAL CARE IF:   You have confusion, personality changes or seizures.   You have headaches that wake you from sleep.   You have an increased frequency in your headaches.   You have a stiff neck.   You have a loss of vision.   You have muscle weakness.   You start losing your balance or have trouble walking.   You feel faint or pass out.  MAKE SURE YOU:   Understand these instructions.   Will watch your condition.   Will get help right away if you are not doing well or get worse.  Document Released: 06/07/2005 Document Revised: 05/27/2011 Document Reviewed: 01/21/2009 [facility] Patient Information 2012 [place], [place].

## 2012-03-15 NOTE — Telephone Encounter (Signed)
Needs a letter stating it is ok for her to take swim and aerobics class.  (Since she is on medication.)

## 2012-03-15 NOTE — Progress Notes (Signed)
Subjective:     Patient ID: Kirsten Walsh, female   DOB: May 28, 1961, 51 y.o.   MRN: [id number]  HPI Pt with chronic daily migraines states that her migraine has been more severe over past 8 or 9 days. She has been vomiting and dry heaving and even passed out in school last week due to vomiting, dehydration and dizziness. She has not passed out since that time. She has not been eating or drinking much over the last few days but did void this morning. She has nausea, light sensitivity, dizziness/lightheadedness and 10/10 pain on the right side of her face/head down to her right neck and shoulder. This migraine is not different from her previous migraines. She often has escalating symptoms for which she comes to the clinic or goes to the ED for acute treatment. She is on topamax for migraine prophylaxis but does not think it is working well. She does not have any acute migraine medications. She does have phenergan suppositories at home for nausea. A consult with neurology is pending.  Review of Systems  Constitutional: Positive for fatigue. Negative for fever and chills.  HENT: Positive for neck pain.   Eyes: Positive for photophobia and pain.  Respiratory: Negative for shortness of breath.   Cardiovascular: Negative for chest pain.  Neurological: Positive for dizziness, light-headedness and headaches.       No new numbness, weakness (has chronic right-sided pain/weakness)       Objective:   Physical Exam  Constitutional: She is oriented to person, place, and time. She appears well-developed and well-nourished. She appears distressed (moderate distress due to pain).  HENT:  Head: Normocephalic and atraumatic.  Eyes: Conjunctivae normal and EOM are normal. Pupils are equal, round, and reactive to light.       Both eyes blinking frequently.  Neck: Normal range of motion. Neck supple.       Tender over right neck and shoulder.   Cardiovascular: Normal rate, regular rhythm and normal heart  sounds.   Pulmonary/Chest: Effort normal and breath sounds normal. No respiratory distress. She has no wheezes. She has no rales.  Neurological: She is alert and oriented to person, place, and time. No cranial nerve deficit.       Upper extremity strength slightly less on R than L but 5/5 bilaterally.  Skin: Skin is warm and dry.  Psychiatric: She has a normal mood and affect.       Assessment:     Status migranosus    Plan:

## 2012-03-15 NOTE — Assessment & Plan Note (Signed)
Status Migranosus - 6 mg Imitrex IM - 25 mg Phenergan IM - 10 mg Dexamethasone IM Given in office today.  Zofran ODT given for use at school so patient can attend classes without vomiting, getting dehydrated/lightheaded.

## 2012-03-16 ENCOUNTER — Encounter: Payer: Self-pay | Admitting: Family Medicine

## 2012-03-16 NOTE — Telephone Encounter (Signed)
Letter written, placed in folder in front office, please notify patient.

## 2012-04-12 ENCOUNTER — Emergency Department (HOSPITAL_BASED_OUTPATIENT_CLINIC_OR_DEPARTMENT_OTHER)
Admission: EM | Admit: 2012-04-12 | Discharge: 2012-04-13 | Disposition: A | Discharge status: Home / Self Care | Payer: Medicaid Other | Attending: Emergency Medicine | Admitting: Emergency Medicine

## 2012-04-12 ENCOUNTER — Encounter (HOSPITAL_BASED_OUTPATIENT_CLINIC_OR_DEPARTMENT_OTHER): Payer: Self-pay | Admitting: Emergency Medicine

## 2012-04-12 DIAGNOSIS — N289 Disorder of kidney and ureter, unspecified: Secondary | ICD-10-CM | POA: Insufficient documentation

## 2012-04-12 DIAGNOSIS — G43909 Migraine, unspecified, not intractable, without status migrainosus: Secondary | ICD-10-CM | POA: Insufficient documentation

## 2012-04-12 DIAGNOSIS — R112 Nausea with vomiting, unspecified: Secondary | ICD-10-CM | POA: Insufficient documentation

## 2012-04-12 DIAGNOSIS — I10 Essential (primary) hypertension: Secondary | ICD-10-CM | POA: Insufficient documentation

## 2012-04-12 DIAGNOSIS — R55 Syncope and collapse: Secondary | ICD-10-CM | POA: Insufficient documentation

## 2012-04-12 DIAGNOSIS — R404 Transient alteration of awareness: Secondary | ICD-10-CM | POA: Insufficient documentation

## 2012-04-12 DIAGNOSIS — R51 Headache: Secondary | ICD-10-CM

## 2012-04-12 DIAGNOSIS — R079 Chest pain, unspecified: Secondary | ICD-10-CM | POA: Insufficient documentation

## 2012-04-12 DIAGNOSIS — K8020 Calculus of gallbladder without cholecystitis without obstruction: Secondary | ICD-10-CM | POA: Insufficient documentation

## 2012-04-12 LAB — URINALYSIS, ROUTINE W REFLEX MICROSCOPIC
Leukocytes, UA: NEGATIVE
Nitrite: NEGATIVE
Protein, ur: NEGATIVE mg/dL
Specific Gravity, Urine: 1.009 (ref 1.005–1.030)
Urobilinogen, UA: 0.2 mg/dL (ref 0.0–1.0)

## 2012-04-12 LAB — PREGNANCY, URINE: Preg Test, Ur: NEGATIVE

## 2012-04-12 MED ORDER — DIPHENHYDRAMINE HCL 50 MG/ML IJ SOLN
INTRAMUSCULAR | Status: AC
  Administered 2012-04-12: 22:00:00
  Filled 2012-04-12: qty 1

## 2012-04-12 MED ORDER — SODIUM CHLORIDE 0.9 % IV BOLUS (SEPSIS)
1000.0000 mL | Freq: Once | INTRAVENOUS | Status: AC
  Administered 2012-04-12: 1000 mL via INTRAVENOUS

## 2012-04-12 MED ORDER — ONDANSETRON HCL 4 MG/2ML IJ SOLN
INTRAMUSCULAR | Status: AC
  Administered 2012-04-12: 22:00:00
  Filled 2012-04-12: qty 2

## 2012-04-12 MED ORDER — SODIUM CHLORIDE 0.9 % IV SOLN
Freq: Once | INTRAVENOUS | Status: AC
  Administered 2012-04-13: 20 mL/h via INTRAVENOUS

## 2012-04-12 MED ORDER — METOCLOPRAMIDE HCL 5 MG/ML IJ SOLN
10.0000 mg | Freq: Once | INTRAMUSCULAR | Status: AC
  Administered 2012-04-12: 10 mg via INTRAVENOUS
  Filled 2012-04-12: qty 2

## 2012-04-12 MED ORDER — DIPHENHYDRAMINE HCL 50 MG/ML IJ SOLN
25.0000 mg | Freq: Once | INTRAMUSCULAR | Status: AC
  Administered 2012-04-12: 25 mg via INTRAVENOUS
  Filled 2012-04-12: qty 1

## 2012-04-12 NOTE — ED Provider Notes (Signed)
History     CSN: [id number]  Arrival date & time 04/12/12  2148   First MD Initiated Contact with Patient 04/12/12 2256      Chief Complaint  Patient presents with  . Near Syncope     Patient is a 51 y.o. female presenting with syncope. The history is provided by the patient.  Loss of Consciousness This is a new problem. The problem has been gradually improving. Associated symptoms include headaches. Pertinent negatives include no chest pain and no shortness of breath. The symptoms are aggravated by standing. The symptoms are relieved by rest. She has tried rest for the symptoms. The treatment provided mild relief.  Pt reports she has been vomiting all day due to migraine HA (reports daily headache, similar to prior) She went to store to pick up her shoes She reports standing up too fast, feeling faint and was let down to floor. She reports brief LOC.  No seizure.  No cp/sob.  No focal weakness.  She denies diarrhea and denies rectal/vaginal bleeding No abd pain No sz reported.  No tongue biting endorsed  Past Medical History  Diagnosis Date  . Migraine   . Hypertension   . Migraine   . Chest pain   . Gall stones   . Renal disorder     Past Surgical History  Procedure Date  . Cholecystectomy   . Kidney stone surgery     Family History  Problem Relation Age of Onset  . Cancer Mother   . Cirrhosis Father     History  Substance Use Topics  . Smoking status: Never Smoker   . Smokeless tobacco: Never Used  . Alcohol Use: Yes     occassionally    OB History    Grav Para Term Preterm Abortions TAB SAB Ect Mult Living                  Review of Systems  Respiratory: Negative for shortness of breath.   Cardiovascular: Positive for syncope. Negative for chest pain.  Neurological: Positive for headaches.  All other systems reviewed and are negative.    Allergies  Apple; Aspirin; and Carrot  Home Medications   Current Outpatient Rx  Name Route Sig  Dispense Refill  . CLONIDINE HCL 0.1 MG/24HR TD PTWK Transdermal Place 1 patch onto the skin once a week. Applied on Mondays.    Marland Kitchen LISINOPRIL-HYDROCHLOROTHIAZIDE 20-12.5 MG PO TABS Oral Take 2 tablets by mouth daily. 30 tablet 11  . METOPROLOL TARTRATE 25 MG PO TABS Oral Take 12.5 mg by mouth 2 (two) times daily.    Marland Kitchen ONDANSETRON HCL 4 MG PO TABS Oral Take 1 tablet (4 mg total) by mouth every 8 (eight) hours as needed. For nausea 20 tablet 0  . PREGABALIN 50 MG PO CAPS Oral Take 1 capsule (50 mg total) by mouth 3 (three) times daily. 90 capsule 2  . TOPIRAMATE 100 MG PO TABS Oral Take 1 tablet (100 mg total) by mouth 2 (two) times daily. 60 tablet 4  . EPINEPHRINE 0.3 MG/0.3ML IJ DEVI Intramuscular Inject 0.3 mg into the muscle once as needed. For allergic reaction    . FLUTICASONE PROPIONATE 50 MCG/ACT NA SUSP Nasal Place 1 spray into the nose 2 (two) times daily as needed. For allergies    . ONDANSETRON 4 MG PO TBDP Oral Take 1 tablet (4 mg total) by mouth every 6 (six) hours as needed for nausea. 30 tablet 0  . PROMETHAZINE HCL 25 MG  RE SUPP Rectal Place 25 mg rectally every 6 (six) hours as needed. For nausea      BP 120/67  Pulse 67  Resp 20  Ht 5\' 5"  (1.651 m)  Wt 198 lb (89.812 kg)  BMI 32.95 kg/m2  SpO2 100%  Physical Exam CONSTITUTIONAL: Well developed/well nourished HEAD AND FACE: Normocephalic/atraumatic EYES: EOMI/PERRL, no nystagmus ENMT: Mucous membranes moist NECK: supple no meningeal signs, no bruits SPINE:entire spine nontender CV: S1/S2 noted, no murmurs/rubs/gallops noted LUNGS: Lungs are clear to auscultation bilaterally, no apparent distress ABDOMEN: soft, nontender, no rebound or guarding GU:no cva tenderness NEURO:Awake/alert, facies symmetric, no arm or leg drift is noted No past pointing EXTREMITIES: pulses normal, full ROM SKIN: warm, color normal PSYCH: no abnormalities of mood noted    ED Course  Procedures    Labs Reviewed  URINALYSIS, ROUTINE  W REFLEX MICROSCOPIC   11:21 PM Pt with syncopal event while shopping.  She reports she stood up too fast.  She now reports migraine HA similar to prior  Review of chart shows she has been seen by cardiology previously and does not have cardiomyopathy  12:03 AM Pt improved Able to ambulate on her own Stable for d/c   MDM  Nursing notes including past medical history and social history reviewed and considered in documentation Previous records reviewed and considered - outpatient visits reviewed Labs/vital reviewed and considered        Date: 04/12/2012  Rate: 72  Rhythm: normal sinus rhythm  QRS Axis: normal  Intervals: normal  ST/T Wave abnormalities: normal  Conduction Disutrbances:none No change when compared to prior     Joya Gaskins, MD 04/13/12 0003

## 2012-04-12 NOTE — ED Notes (Signed)
Pt has history of migraine, htn, pt was shopping and manager was talking with patient when she got light headed, she was lowered to ground by manager pt has no memory of event

## 2012-04-12 NOTE — ED Notes (Signed)
Pt present via ems after witnessed syncopal episode while shopping, pt reports having a migraine all day, pt has no memory of event. Pt was given zofran in route and iv infiltrated, streaking noted to left anterior forearm

## 2012-04-12 NOTE — ED Notes (Signed)
Pt reports being nauseated and vomiting today secondary to migraines, took phenegran suppository with no relief

## 2012-04-13 MED ORDER — OXYCODONE-ACETAMINOPHEN 5-325 MG PO TABS
2.0000 | ORAL_TABLET | Freq: Once | ORAL | Status: AC
  Administered 2012-04-13: 2 via ORAL
  Filled 2012-04-13 (×2): qty 2

## 2012-04-14 ENCOUNTER — Encounter (HOSPITAL_BASED_OUTPATIENT_CLINIC_OR_DEPARTMENT_OTHER): Payer: Self-pay | Admitting: *Deleted

## 2012-04-14 ENCOUNTER — Emergency Department (HOSPITAL_BASED_OUTPATIENT_CLINIC_OR_DEPARTMENT_OTHER)
Admission: EM | Admit: 2012-04-14 | Discharge: 2012-04-14 | Disposition: A | Discharge status: Home / Self Care | Payer: Medicaid Other | Attending: Emergency Medicine | Admitting: Emergency Medicine

## 2012-04-14 DIAGNOSIS — R55 Syncope and collapse: Secondary | ICD-10-CM | POA: Insufficient documentation

## 2012-04-14 DIAGNOSIS — G43001 Migraine without aura, not intractable, with status migrainosus: Secondary | ICD-10-CM

## 2012-04-14 DIAGNOSIS — R079 Chest pain, unspecified: Secondary | ICD-10-CM | POA: Insufficient documentation

## 2012-04-14 DIAGNOSIS — R42 Dizziness and giddiness: Secondary | ICD-10-CM | POA: Insufficient documentation

## 2012-04-14 DIAGNOSIS — I10 Essential (primary) hypertension: Secondary | ICD-10-CM | POA: Insufficient documentation

## 2012-04-14 DIAGNOSIS — G43901 Migraine, unspecified, not intractable, with status migrainosus: Secondary | ICD-10-CM | POA: Insufficient documentation

## 2012-04-14 DIAGNOSIS — N259 Disorder resulting from impaired renal tubular function, unspecified: Secondary | ICD-10-CM | POA: Insufficient documentation

## 2012-04-14 DIAGNOSIS — Z79899 Other long term (current) drug therapy: Secondary | ICD-10-CM | POA: Insufficient documentation

## 2012-04-14 DIAGNOSIS — K8020 Calculus of gallbladder without cholecystitis without obstruction: Secondary | ICD-10-CM | POA: Insufficient documentation

## 2012-04-14 MED ORDER — DIPHENHYDRAMINE HCL 50 MG/ML IJ SOLN
12.5000 mg | Freq: Once | INTRAMUSCULAR | Status: AC
Start: 2012-04-14 — End: 2012-04-14
  Administered 2012-04-14: 12.5 mg via INTRAVENOUS
  Filled 2012-04-14: qty 1

## 2012-04-14 MED ORDER — FENTANYL CITRATE 0.05 MG/ML IJ SOLN
100.0000 ug | Freq: Once | INTRAMUSCULAR | Status: AC
  Administered 2012-04-14: 100 ug via INTRAVENOUS
  Filled 2012-04-14: qty 2

## 2012-04-14 MED ORDER — HYDROCODONE-ACETAMINOPHEN 5-500 MG PO TABS: 1.0000 | ORAL_TABLET | Freq: Four times a day (QID) | ORAL | Status: DC | PRN

## 2012-04-14 MED ORDER — PROCHLORPERAZINE MALEATE 10 MG PO TABS
10.0000 mg | ORAL_TABLET | Freq: Once | ORAL | Status: AC
  Administered 2012-04-14: 10 mg via ORAL
  Filled 2012-04-14: qty 1

## 2012-04-14 MED ORDER — PROMETHAZINE HCL 25 MG/ML IJ SOLN
25.0000 mg | INTRAMUSCULAR | Status: DC | PRN
  Administered 2012-04-14: 25 mg via INTRAVENOUS
  Filled 2012-04-14: qty 1

## 2012-04-14 MED ORDER — SODIUM CHLORIDE 0.9 % IV BOLUS (SEPSIS): 1000.0000 mL | Freq: Once | INTRAVENOUS | Status: DC

## 2012-04-14 NOTE — ED Provider Notes (Signed)
History     CSN: [id number]  Arrival date & time 04/14/12  1214   First MD Initiated Contact with Patient 04/14/12 1235      Chief Complaint  Patient presents with  . Loss of Consciousness     HPI Patient with history of migraine comes in with dizziness and near-syncope.  Had similar symptoms last night.  Was discharged from emergency department around 2 AM.  Went home got little sleep and then try to go to school.  Got dizzy when she stood and nearly passed out.  Still his complaint of migraine headache.  Patient has long standing history of migraine has tried many different medications and treatments for it. Past Medical History  Diagnosis Date  . Migraine   . Hypertension   . Migraine   . Chest pain   . Gall stones   . Renal disorder     Past Surgical History  Procedure Date  . Cholecystectomy   . Kidney stone surgery     Family History  Problem Relation Age of Onset  . Cancer Mother   . Cirrhosis Father     History  Substance Use Topics  . Smoking status: Never Smoker   . Smokeless tobacco: Never Used  . Alcohol Use: Yes     occassionally    OB History    Grav Para Term Preterm Abortions TAB SAB Ect Mult Living                  Review of Systems All other systems reviewed and are negative Allergies  Apple; Aspirin; and Carrot  Home Medications   Current Outpatient Rx  Name Route Sig Dispense Refill  . CLONIDINE HCL 0.1 MG/24HR TD PTWK Transdermal Place 1 patch onto the skin once a week. Applied on Mondays.    Marland Kitchen EPINEPHRINE 0.3 MG/0.3ML IJ DEVI Intramuscular Inject 0.3 mg into the muscle once as needed. For allergic reaction    . FLUTICASONE PROPIONATE 50 MCG/ACT NA SUSP Nasal Place 1 spray into the nose 2 (two) times daily as needed. For allergies    . LISINOPRIL-HYDROCHLOROTHIAZIDE 20-12.5 MG PO TABS Oral Take 2 tablets by mouth daily. 30 tablet 11  . METOPROLOL TARTRATE 25 MG PO TABS Oral Take 12.5 mg by mouth 2 (two) times daily.    Marland Kitchen  ONDANSETRON 4 MG PO TBDP Oral Take 1 tablet (4 mg total) by mouth every 6 (six) hours as needed for nausea. 30 tablet 0  . ONDANSETRON HCL 4 MG PO TABS Oral Take 1 tablet (4 mg total) by mouth every 8 (eight) hours as needed. For nausea 20 tablet 0  . PREGABALIN 50 MG PO CAPS Oral Take 1 capsule (50 mg total) by mouth 3 (three) times daily. 90 capsule 2  . PROMETHAZINE HCL 25 MG RE SUPP Rectal Place 25 mg rectally every 6 (six) hours as needed. For nausea    . TOPIRAMATE 100 MG PO TABS Oral Take 1 tablet (100 mg total) by mouth 2 (two) times daily. 60 tablet 4    BP 109/71  Pulse 110  Temp 98.9 F (37.2 C) (Oral)  Resp 20  SpO2 96%  Physical Exam  Nursing note and vitals reviewed. Constitutional: She is oriented to person, place, and time. She appears well-developed and well-nourished. No distress.  HENT:  Head: Normocephalic and atraumatic.  Eyes: Pupils are equal, round, and reactive to light.  Neck: Normal range of motion.  Cardiovascular: Normal rate and intact distal pulses.   Pulmonary/Chest:  No respiratory distress.  Abdominal: Normal appearance. She exhibits no distension.  Musculoskeletal: Normal range of motion.  Neurological: She is alert and oriented to person, place, and time. She has normal strength. No cranial nerve deficit or sensory deficit. Coordination and gait normal. GCS eye subscore is 4. GCS verbal subscore is 5. GCS motor subscore is 6.  Skin: Skin is warm and dry. No rash noted.  Psychiatric: She has a normal mood and affect. Her behavior is normal.    ED Course  Procedures (including critical care time)  Date: 04/14/2012  Rate: 69  Rhythm: normal sinus rhythm  QRS Axis: normal  Intervals: normal  ST/T Wave abnormalities: normal  Conduction Disutrbances: none  Narrative Interpretation: unremarkable    Medications  promethazine (PHENERGAN) injection 25 mg (25 mg Intravenous Given 04/14/12 1300)  sodium chloride 0.9 % bolus 1,000 mL (1000 mL  Intravenous New Bag/Given 04/14/12 1300)  fentaNYL (SUBLIMAZE) injection 100 mcg (100 mcg Intravenous Given 04/14/12 1329)  diphenhydrAMINE (BENADRYL) injection 12.5 mg (12.5 mg Intravenous Given 04/14/12 1438)  prochlorperazine (COMPAZINE) tablet 10 mg (10 mg Oral Given 04/14/12 1438)  fentaNYL (SUBLIMAZE) injection 100 mcg (100 mcg Intravenous Given 04/14/12 1439)     Labs Reviewed - No data to display No results found.   1. Headache, common migraine, with status migrainosus       MDM   Review of patient's medical records reveal she has a long complicated history of status migrainosus.  She has been referred to pain clinics and seen neurologist. about the same problem.  She has had numerous imaging studies.  She has tried several different therapies.  After 1 L of fluid and 100 mcg of fentanyl and Phenergan patient had some improvement.  Plan is to give a second liter of fluid had Compazine, diphenhydramine, and 100 mcg of fentanyl and discharge.      Nelia Shi, MD 04/14/12 1447

## 2012-04-14 NOTE — ED Notes (Signed)
To ED via EMS c/o syncope. Was seen here yesterday for same. accu-check 121. Alert oriented on arrival.

## 2012-05-17 ENCOUNTER — Encounter (HOSPITAL_COMMUNITY): Payer: Self-pay | Admitting: Emergency Medicine

## 2012-05-17 ENCOUNTER — Emergency Department (HOSPITAL_COMMUNITY)
Admission: EM | Admit: 2012-05-17 | Discharge: 2012-05-18 | Disposition: A | Discharge status: Home / Self Care | Payer: Medicaid Other | Attending: Emergency Medicine | Admitting: Emergency Medicine

## 2012-05-17 DIAGNOSIS — R51 Headache: Secondary | ICD-10-CM | POA: Insufficient documentation

## 2012-05-17 DIAGNOSIS — G43909 Migraine, unspecified, not intractable, without status migrainosus: Secondary | ICD-10-CM | POA: Insufficient documentation

## 2012-05-17 DIAGNOSIS — Z79899 Other long term (current) drug therapy: Secondary | ICD-10-CM | POA: Insufficient documentation

## 2012-05-17 DIAGNOSIS — R55 Syncope and collapse: Secondary | ICD-10-CM | POA: Insufficient documentation

## 2012-05-17 DIAGNOSIS — Z8719 Personal history of other diseases of the digestive system: Secondary | ICD-10-CM | POA: Insufficient documentation

## 2012-05-17 DIAGNOSIS — Z87448 Personal history of other diseases of urinary system: Secondary | ICD-10-CM | POA: Insufficient documentation

## 2012-05-17 DIAGNOSIS — R404 Transient alteration of awareness: Secondary | ICD-10-CM | POA: Insufficient documentation

## 2012-05-17 DIAGNOSIS — H53149 Visual discomfort, unspecified: Secondary | ICD-10-CM | POA: Insufficient documentation

## 2012-05-17 DIAGNOSIS — I10 Essential (primary) hypertension: Secondary | ICD-10-CM | POA: Insufficient documentation

## 2012-05-17 LAB — CBC WITH DIFFERENTIAL/PLATELET
Basophils Absolute: 0 10*3/uL (ref 0.0–0.1)
Basophils Relative: 0 % (ref 0–1)
Eosinophils Relative: 3 % (ref 0–5)
HCT: 37.1 % (ref 36.0–46.0)
MCHC: 34.5 g/dL (ref 30.0–36.0)
MCV: 91.2 fL (ref 78.0–100.0)
Monocytes Absolute: 0.4 10*3/uL (ref 0.1–1.0)
RDW: 13.5 % (ref 11.5–15.5)

## 2012-05-17 LAB — URINALYSIS, ROUTINE W REFLEX MICROSCOPIC
Bilirubin Urine: NEGATIVE
Hgb urine dipstick: NEGATIVE
Ketones, ur: NEGATIVE mg/dL
Nitrite: NEGATIVE
pH: 6 (ref 5.0–8.0)

## 2012-05-17 LAB — BASIC METABOLIC PANEL
BUN: 8 mg/dL (ref 6–23)
CO2: 21 mEq/L (ref 19–32)
Calcium: 8.9 mg/dL (ref 8.4–10.5)
Creatinine, Ser: 0.91 mg/dL (ref 0.50–1.10)

## 2012-05-17 LAB — URINE MICROSCOPIC-ADD ON

## 2012-05-17 LAB — TROPONIN I: Troponin I: <0.3 ng/mL (ref <0.30)

## 2012-05-17 MED ORDER — DIPHENHYDRAMINE HCL 50 MG/ML IJ SOLN
25.0000 mg | Freq: Once | INTRAMUSCULAR | Status: AC
  Administered 2012-05-17: 25 mg via INTRAVENOUS
  Filled 2012-05-17: qty 1

## 2012-05-17 MED ORDER — KETOROLAC TROMETHAMINE 30 MG/ML IJ SOLN
30.0000 mg | Freq: Once | INTRAMUSCULAR | Status: AC
  Administered 2012-05-17: 30 mg via INTRAVENOUS
  Filled 2012-05-17: qty 1

## 2012-05-17 MED ORDER — DEXAMETHASONE SODIUM PHOSPHATE 10 MG/ML IJ SOLN
10.0000 mg | Freq: Once | INTRAMUSCULAR | Status: AC
  Administered 2012-05-17: 10 mg via INTRAVENOUS
  Filled 2012-05-17: qty 1

## 2012-05-17 MED ORDER — SODIUM CHLORIDE 0.9 % IV SOLN
Freq: Once | INTRAVENOUS | Status: AC
  Administered 2012-05-17: 23:00:00 via INTRAVENOUS

## 2012-05-17 MED ORDER — METOCLOPRAMIDE HCL 5 MG/ML IJ SOLN
10.0000 mg | Freq: Once | INTRAMUSCULAR | Status: AC
  Administered 2012-05-17: 10 mg via INTRAVENOUS
  Filled 2012-05-17: qty 2

## 2012-05-17 MED ORDER — SODIUM CHLORIDE 0.9 % IV BOLUS (SEPSIS)
1000.0000 mL | Freq: Once | INTRAVENOUS | Status: AC
  Administered 2012-05-17: 1000 mL via INTRAVENOUS

## 2012-05-17 MED ORDER — HYDROMORPHONE HCL PF 1 MG/ML IJ SOLN
1.0000 mg | Freq: Once | INTRAMUSCULAR | Status: AC
  Administered 2012-05-17: 1 mg via INTRAVENOUS
  Filled 2012-05-17: qty 1

## 2012-05-17 MED ORDER — FENTANYL CITRATE 0.05 MG/ML IJ SOLN
50.0000 ug | Freq: Once | INTRAMUSCULAR | Status: AC
  Administered 2012-05-17: 50 ug via INTRAVENOUS
  Filled 2012-05-17: qty 2

## 2012-05-17 NOTE — ED Notes (Signed)
[id number]<[id number]> Expected date:<BR> Expected time:<BR> Means of arrival:<BR> Comments:<BR> Migraine headache/syncope

## 2012-05-17 NOTE — ED Notes (Signed)
IV RN at bedside 

## 2012-05-17 NOTE — ED Notes (Signed)
IV team paged for IV start. Sts pt is on list and it will be about 1.5 hours before she can get to pt.

## 2012-05-17 NOTE — ED Notes (Signed)
Kirsten Walsh, EDP informed IV still unavailable. Sts he will see another pt, then attempt ultrasound guided placement.

## 2012-05-17 NOTE — ED Provider Notes (Addendum)
History     CSN: [id number]  Arrival date & time 05/17/12  1531   First MD Initiated Contact with Patient 05/17/12 1629      Chief Complaint  Patient presents with  . Migraine  . Loss of Consciousness    (Consider location/radiation/quality/duration/timing/severity/associated sxs/prior treatment) HPI Comments: Pt comes in with cc of migraines and possible syncope. Pt has a complicated migraine hx, and is currently on topimax, and they are looking for a new neurologist. Pt has been having headache related to her migraine x 3 weeks. The pain is pretty much constant, and typical of her migraines, with nausea, emesis, photophobia. Pt states that she might have passed out when EMS arrived. She had no chest pain, palpitations, sob, no hx of DVT, PE. No CAD, CHF.   Patient is a 51 y.o. female presenting with migraines and syncope. The history is provided by the patient and medical records.  Migraine Associated symptoms include headaches. Pertinent negatives include no chest pain, no abdominal pain and no shortness of breath.  Loss of Consciousness Associated symptoms include headaches. Pertinent negatives include no chest pain, no abdominal pain and no shortness of breath.    Past Medical History  Diagnosis Date  . Migraine   . Hypertension   . Migraine   . Chest pain   . Gall stones   . Renal disorder     Past Surgical History  Procedure Date  . Cholecystectomy   . Kidney stone surgery     Family History  Problem Relation Age of Onset  . Cancer Mother   . Cirrhosis Father     History  Substance Use Topics  . Smoking status: Never Smoker   . Smokeless tobacco: Never Used  . Alcohol Use: Yes     Comment: occassionally    OB History    Grav Para Term Preterm Abortions TAB SAB Ect Mult Living                  Review of Systems  Constitutional: Negative for activity change.  HENT: Negative for facial swelling and neck pain.   Eyes: Positive for photophobia.    Respiratory: Negative for cough, shortness of breath and wheezing.   Cardiovascular: Positive for syncope. Negative for chest pain.  Gastrointestinal: Negative for nausea, vomiting, abdominal pain, diarrhea, constipation, blood in stool and abdominal distention.  Genitourinary: Negative for hematuria and difficulty urinating.  Skin: Negative for color change.  Neurological: Positive for syncope and headaches. Negative for speech difficulty.  Hematological: Does not bruise/bleed easily.  Psychiatric/Behavioral: Negative for confusion.    Allergies  Apple; Aspirin; and Carrot  Home Medications   Current Outpatient Rx  Name  Route  Sig  Dispense  Refill  . CLONIDINE HCL 0.1 MG/24HR TD PTWK   Transdermal   Place 1 patch onto the skin once a week. Applied on Mondays.         Marland Kitchen EPINEPHRINE 0.3 MG/0.3ML IJ DEVI   Intramuscular   Inject 0.3 mg into the muscle once as needed. For allergic reaction         . FLUTICASONE PROPIONATE 50 MCG/ACT NA SUSP   Nasal   Place 1 spray into the nose 2 (two) times daily as needed. For allergies         . HYDROCODONE-ACETAMINOPHEN 5-500 MG PO TABS   Oral   Take 1-2 tablets by mouth every 6 (six) hours as needed for pain.   15 tablet   0   .  LISINOPRIL-HYDROCHLOROTHIAZIDE 20-12.5 MG PO TABS   Oral   Take 2 tablets by mouth daily.   30 tablet   11   . METOPROLOL TARTRATE 25 MG PO TABS   Oral   Take 12.5 mg by mouth 2 (two) times daily.         Marland Kitchen ONDANSETRON 4 MG PO TBDP   Oral   Take 1 tablet (4 mg total) by mouth every 6 (six) hours as needed for nausea.   30 tablet   0   . ONDANSETRON HCL 4 MG PO TABS   Oral   Take 1 tablet (4 mg total) by mouth every 8 (eight) hours as needed. For nausea   20 tablet   0   . PREGABALIN 50 MG PO CAPS   Oral   Take 1 capsule (50 mg total) by mouth 3 (three) times daily.   90 capsule   2   . PROMETHAZINE HCL 25 MG RE SUPP   Rectal   Place 25 mg rectally every 6 (six) hours as needed.  For nausea         . TOPIRAMATE 100 MG PO TABS   Oral   Take 1 tablet (100 mg total) by mouth 2 (two) times daily.   60 tablet   4     BP 120/75  Pulse 58  Temp 98.8 F (37.1 C) (Oral)  Resp 16  SpO2 100%  Physical Exam  Nursing note and vitals reviewed. Constitutional: She is oriented to person, place, and time. She appears well-developed.  HENT:  Head: Normocephalic and atraumatic.  Eyes: Conjunctivae normal and EOM are normal. Pupils are equal, round, and reactive to light.  Neck: Normal range of motion. Neck supple.  Cardiovascular: Normal rate, regular rhythm and normal heart sounds.   Pulmonary/Chest: Effort normal and breath sounds normal. No respiratory distress.  Abdominal: Soft. Bowel sounds are normal. She exhibits no distension. There is no tenderness. There is no rebound and no guarding.  Neurological: She is alert and oriented to person, place, and time. No cranial nerve deficit.       Cerebellar exam is normal (finger to nose) Sensory exam normal for bilateral upper and lower extremities - and patient is able to discriminate between sharp and dull. Motor exam is 4+/5  Skin: Skin is warm and dry.    ED Course  Procedures (including critical care time)   Labs Reviewed  MAGNESIUM  CBC WITH DIFFERENTIAL  BASIC METABOLIC PANEL  TROPONIN I  URINALYSIS, ROUTINE W REFLEX MICROSCOPIC   No results found.   No diagnosis found.    MDM   Date: 05/17/2012  Rate:60  Rhythm: normal sinus rhythm  QRS Axis: normal  Intervals: normal  ST/T Wave abnormalities: normal  Conduction Disutrbances: none  Narrative Interpretation: unremarkable   DDx includes: Orthostatic hypotension Stroke Vertebral artery dissection/stenosis Dysrhythmia PE Vasovagal/neurocardiogenic syncope Aortic stenosis Valvular disorder/Cardiomyopathy Anemia Vascular headaches AV malformation Brain aneurysm Muscular headaches Complicated migraine   A/P: Pt comes in with cc  of headaches. No concerns for life threatening secondary headaches because the headache is consistent with her previous headaches, and they have been ongoing for 2 weeks. Neuro exam is benign. She has had syncope with the headaches in the past as well - likely complicated migraine, basilar headaches. We will control pain.    [name], MD 05/17/12 1725  Angiocath insertion Performed by: [name]  Consent: Verbal consent obtained. Risks and benefits: risks, benefits and alternatives were discussed Time out:  Immediately prior to procedure a "time out" was called to verify the correct patient, procedure, equipment, support staff and site/side marked as required.  Preparation: Patient was prepped and draped in the usual sterile fashion.  Vein Location:left brachial  Ultrasound Guided  Gauge: 20  IV blew Patient tolerance: Patient tolerated the procedure well with no immediate complications.   Angiocath insertion Performed by: [name]  Consent: Verbal consent obtained. Risks and benefits: risks, benefits and alternatives were discussed Time out: Immediately prior to procedure a "time out" was called to verify the correct patient, procedure, equipment, support staff and site/side marked as required.  Preparation: Patient was prepped and draped in the usual sterile fashion.  Vein Location: right EJ   Gauge: 20 gauge  Normal blood return and flush without difficulty Patient tolerance: Patient tolerated the procedure well with no immediate complications.    [name], MD 05/17/12 2018  Feeling better.Marland Kitchen...will consider discharge soon.  [name], MD 05/17/12 2202

## 2012-05-17 NOTE — ED Notes (Signed)
Per EMS- Pt c/o of witness syncopal episode at beauty salon. Pt hx of migraines and states that she usually has syncopal episodes.   BP 204/140 initially. Pt also c/o of nausea with dry heaves, no vomiting. No NAD at this time.

## 2012-05-18 LAB — URINE CULTURE
Colony Count: NO GROWTH
Culture: NO GROWTH

## 2012-05-18 MED ORDER — KETOROLAC TROMETHAMINE 30 MG/ML IJ SOLN
15.0000 mg | Freq: Once | INTRAMUSCULAR | Status: AC
  Administered 2012-05-18: 15 mg via INTRAVENOUS
  Filled 2012-05-18: qty 1

## 2012-05-18 MED ORDER — METOCLOPRAMIDE HCL 5 MG/ML IJ SOLN
10.0000 mg | Freq: Once | INTRAMUSCULAR | Status: AC
  Administered 2012-05-18: 10 mg via INTRAVENOUS
  Filled 2012-05-18: qty 2

## 2012-05-18 MED ORDER — HYDROMORPHONE HCL PF 1 MG/ML IJ SOLN
1.0000 mg | Freq: Once | INTRAMUSCULAR | Status: AC
  Administered 2012-05-18: 1 mg via INTRAVENOUS
  Filled 2012-05-18: qty 1

## 2012-05-22 ENCOUNTER — Emergency Department (HOSPITAL_COMMUNITY)
Admission: EM | Admit: 2012-05-22 | Discharge: 2012-05-23 | Disposition: A | Discharge status: Home / Self Care | Payer: Medicaid Other | Attending: Emergency Medicine | Admitting: Emergency Medicine

## 2012-05-22 ENCOUNTER — Emergency Department (HOSPITAL_COMMUNITY): Payer: Medicaid Other

## 2012-05-22 ENCOUNTER — Encounter (HOSPITAL_COMMUNITY): Payer: Self-pay | Admitting: *Deleted

## 2012-05-22 DIAGNOSIS — Z8719 Personal history of other diseases of the digestive system: Secondary | ICD-10-CM | POA: Insufficient documentation

## 2012-05-22 DIAGNOSIS — I10 Essential (primary) hypertension: Secondary | ICD-10-CM | POA: Insufficient documentation

## 2012-05-22 DIAGNOSIS — Z79899 Other long term (current) drug therapy: Secondary | ICD-10-CM | POA: Insufficient documentation

## 2012-05-22 DIAGNOSIS — Z8669 Personal history of other diseases of the nervous system and sense organs: Secondary | ICD-10-CM | POA: Insufficient documentation

## 2012-05-22 DIAGNOSIS — Y92009 Unspecified place in unspecified non-institutional (private) residence as the place of occurrence of the external cause: Secondary | ICD-10-CM | POA: Insufficient documentation

## 2012-05-22 DIAGNOSIS — W1789XA Other fall from one level to another, initial encounter: Secondary | ICD-10-CM | POA: Insufficient documentation

## 2012-05-22 DIAGNOSIS — G43909 Migraine, unspecified, not intractable, without status migrainosus: Secondary | ICD-10-CM

## 2012-05-22 DIAGNOSIS — S93409A Sprain of unspecified ligament of unspecified ankle, initial encounter: Secondary | ICD-10-CM

## 2012-05-22 DIAGNOSIS — Z87448 Personal history of other diseases of urinary system: Secondary | ICD-10-CM | POA: Insufficient documentation

## 2012-05-22 DIAGNOSIS — Y939 Activity, unspecified: Secondary | ICD-10-CM | POA: Insufficient documentation

## 2012-05-22 MED ORDER — PROCHLORPERAZINE EDISYLATE 5 MG/ML IJ SOLN
10.0000 mg | Freq: Four times a day (QID) | INTRAMUSCULAR | Status: DC | PRN
  Administered 2012-05-22 – 2012-05-23 (×2): 10 mg via INTRAVENOUS
  Filled 2012-05-22: qty 2

## 2012-05-22 MED ORDER — DIPHENHYDRAMINE HCL 50 MG/ML IJ SOLN
25.0000 mg | Freq: Once | INTRAMUSCULAR | Status: AC
  Administered 2012-05-22: 25 mg via INTRAVENOUS
  Filled 2012-05-22: qty 1

## 2012-05-22 NOTE — ED Notes (Signed)
Dr. Bonk at bedside 

## 2012-05-22 NOTE — ED Notes (Signed)
Pt states had migraine this morning and head was hurting bad; pt states she was walking down the hall and tripped on a box and injured her left ankle; pt denies hitting head or LOC; pt states nausea and vomiting since Friday. Pt mentating appropriately.

## 2012-05-22 NOTE — ED Notes (Signed)
Pt reports hx of migraine (been seen in the ED x 2 for same).  States that she was walking down the hall, tripped and fell twisting her (L) ankle.  Reports that she did not hit her head when she fell.  States that she is unable to bear weight on her left ankle.  Bilateral leg swelling noted.  No deformity or bruising noted.

## 2012-05-22 NOTE — ED Notes (Signed)
Pt in X ray
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
## 2012-05-23 MED ORDER — HYDROMORPHONE HCL PF 1 MG/ML IJ SOLN
1.0000 mg | Freq: Once | INTRAMUSCULAR | Status: AC
  Administered 2012-05-23: 1 mg via INTRAVENOUS
  Filled 2012-05-23: qty 1

## 2012-05-23 MED ORDER — PROMETHAZINE HCL 25 MG PO TABS: 25.0000 mg | ORAL_TABLET | Freq: Four times a day (QID) | ORAL | Status: DC | PRN

## 2012-05-23 MED ORDER — HYDROCODONE-ACETAMINOPHEN 5-325 MG PO TABS: 1.0000 | ORAL_TABLET | Freq: Four times a day (QID) | ORAL | Status: DC | PRN

## 2012-05-23 NOTE — Progress Notes (Signed)
Orthopedic Tech Progress Note Patient Details:  Kirsten Walsh 04/04/1961 [id number]  Ortho Devices Type of Ortho Device: Crutches   Haskell Flirt 05/23/2012, 12:24 AM

## 2012-05-23 NOTE — ED Notes (Signed)
Ortho notified for crutches.

## 2012-06-06 ENCOUNTER — Other Ambulatory Visit: Payer: Self-pay | Admitting: *Deleted

## 2012-06-06 MED ORDER — PROMETHAZINE HCL 25 MG RE SUPP: 25.0000 mg | Freq: Four times a day (QID) | RECTAL | Status: DC | PRN

## 2012-06-30 ENCOUNTER — Telehealth: Payer: Self-pay | Admitting: Family Medicine

## 2012-06-30 NOTE — Telephone Encounter (Signed)
Patient needs another letter stating that it is ok for her to take yoga, swimming, and aerobics classes.  Please call her when ready to be picked up.

## 2012-07-03 ENCOUNTER — Encounter: Payer: Self-pay | Admitting: Family Medicine

## 2012-07-03 NOTE — Telephone Encounter (Signed)
Letter written and placed up front for patient to pick up.  Please notify her.

## 2012-07-03 NOTE — Telephone Encounter (Signed)
Pt informed. [name], Kirsten Walsh  

## 2012-07-13 ENCOUNTER — Emergency Department (HOSPITAL_COMMUNITY)
Admission: EM | Admit: 2012-07-13 | Discharge: 2012-07-14 | Disposition: A | Discharge status: Home / Self Care | Payer: Medicaid Other | Attending: Emergency Medicine | Admitting: Emergency Medicine

## 2012-07-13 ENCOUNTER — Encounter (HOSPITAL_COMMUNITY): Payer: Self-pay | Admitting: Emergency Medicine

## 2012-07-13 DIAGNOSIS — Z79899 Other long term (current) drug therapy: Secondary | ICD-10-CM | POA: Insufficient documentation

## 2012-07-13 DIAGNOSIS — G43919 Migraine, unspecified, intractable, without status migrainosus: Secondary | ICD-10-CM

## 2012-07-13 DIAGNOSIS — R110 Nausea: Secondary | ICD-10-CM | POA: Insufficient documentation

## 2012-07-13 DIAGNOSIS — G43019 Migraine without aura, intractable, without status migrainosus: Secondary | ICD-10-CM | POA: Insufficient documentation

## 2012-07-13 DIAGNOSIS — R42 Dizziness and giddiness: Secondary | ICD-10-CM | POA: Insufficient documentation

## 2012-07-13 DIAGNOSIS — Z87448 Personal history of other diseases of urinary system: Secondary | ICD-10-CM | POA: Insufficient documentation

## 2012-07-13 DIAGNOSIS — I10 Essential (primary) hypertension: Secondary | ICD-10-CM | POA: Insufficient documentation

## 2012-07-13 DIAGNOSIS — Z8719 Personal history of other diseases of the digestive system: Secondary | ICD-10-CM | POA: Insufficient documentation

## 2012-07-13 DIAGNOSIS — Z8679 Personal history of other diseases of the circulatory system: Secondary | ICD-10-CM | POA: Insufficient documentation

## 2012-07-13 MED ORDER — DIPHENHYDRAMINE HCL 50 MG/ML IJ SOLN
25.0000 mg | Freq: Once | INTRAMUSCULAR | Status: AC
  Administered 2012-07-13: 25 mg via INTRAVENOUS
  Filled 2012-07-13: qty 1

## 2012-07-13 MED ORDER — HYDROMORPHONE HCL PF 1 MG/ML IJ SOLN
1.0000 mg | Freq: Once | INTRAMUSCULAR | Status: AC
  Administered 2012-07-13: 1 mg via INTRAVENOUS
  Filled 2012-07-13: qty 1

## 2012-07-13 MED ORDER — DEXAMETHASONE SODIUM PHOSPHATE 10 MG/ML IJ SOLN
10.0000 mg | Freq: Once | INTRAMUSCULAR | Status: AC
  Administered 2012-07-13: 10 mg via INTRAVENOUS
  Filled 2012-07-13: qty 1

## 2012-07-13 MED ORDER — FENTANYL CITRATE 0.05 MG/ML IJ SOLN
50.0000 ug | Freq: Once | INTRAMUSCULAR | Status: AC
  Administered 2012-07-13: 50 ug via INTRAVENOUS
  Filled 2012-07-13: qty 2

## 2012-07-13 MED ORDER — SODIUM CHLORIDE 0.9 % IV BOLUS (SEPSIS)
1000.0000 mL | Freq: Once | INTRAVENOUS | Status: AC
  Administered 2012-07-13: 1000 mL via INTRAVENOUS

## 2012-07-13 MED ORDER — ONDANSETRON HCL 4 MG/2ML IJ SOLN
4.0000 mg | Freq: Once | INTRAMUSCULAR | Status: AC
  Administered 2012-07-13: 4 mg via INTRAVENOUS
  Filled 2012-07-13: qty 2

## 2012-07-13 NOTE — ED Notes (Signed)
Pt presents to ED via EMS with c/o migraine that started at yoga class Pt has history of migraine. Patient vomiting with EMS.  EMS gave 4mg  IM zofran and 60mg  IM toradol with mild improvement.CBG 100.

## 2012-07-13 NOTE — ED Provider Notes (Signed)
History     CSN: [id number]  Arrival date & time 07/13/12  1626   First MD Initiated Contact with Patient 07/13/12 2002      Chief Complaint  Patient presents with  . Migraine    (Consider location/radiation/quality/duration/timing/severity/associated sxs/prior treatment) HPI  The patient presents to the emergency department with complaints of a migraine headache. She has a long history of migraines for which she used to go to the headache clinic and receive Botox injections. She says that they became too expensive and she no longer can afford to do that. Now she comes the emergency department. She is on Topamax for prophylaxis which does not usually work. She says she goes every single day of her life with headache for some time to person others. While at [employer] today she was doing the up and down movements and her migraine became much more severe causing her to fill very faint and dizzy. She says that this normally happens when her migraine gets really bad. She came to the emergency department today because she needed a day of relief from her headache. She was given 4 mg of IM Zofran and 60 mg of IM Toradol in the emergency vehicle with only mild improvement. She was given 50 mcg fentanyl here in the emergency department. Upon reevaluating the patient she says that her headache has not gotten any better. She says that she gets admitted for her headaches sometimes in that when she is discharged her headaches are usually never completely gone. Past Medical History  Diagnosis Date  . Migraine   . Hypertension   . Migraine   . Chest pain   . Gall stones   . Renal disorder     Past Surgical History  Procedure Date  . Cholecystectomy   . Kidney stone surgery     Family History  Problem Relation Age of Onset  . Cancer Mother   . Cirrhosis Father     History  Substance Use Topics  . Smoking status: Never Smoker   . Smokeless tobacco: Never Used  . Alcohol Use: Yes     Comment:  occassionally    OB History    Grav Para Term Preterm Abortions TAB SAB Ect Mult Living                  Review of Systems  Gastrointestinal: Positive for nausea.  Neurological: Positive for headaches.  All other systems reviewed and are negative.   . Allergies  Apple; Aspirin; and Carrot  Home Medications   Current Outpatient Rx  Name  Route  Sig  Dispense  Refill  . CLONIDINE HCL 0.1 MG/24HR TD PTWK   Transdermal   Place 1 patch onto the skin once a week. Applied on Mondays.         Marland Kitchen LISINOPRIL-HYDROCHLOROTHIAZIDE 20-12.5 MG PO TABS   Oral   Take 2 tablets by mouth daily.   30 tablet   11   . METOPROLOL TARTRATE 25 MG PO TABS   Oral   Take 12.5 mg by mouth 2 (two) times daily.         Marland Kitchen ONDANSETRON 4 MG PO TBDP   Oral   Take 1 tablet (4 mg total) by mouth every 6 (six) hours as needed for nausea.   30 tablet   0   . PREGABALIN 50 MG PO CAPS   Oral   Take 1 capsule (50 mg total) by mouth 3 (three) times daily.   90 capsule  2   . TOPIRAMATE 100 MG PO TABS   Oral   Take 1 tablet (100 mg total) by mouth 2 (two) times daily.   60 tablet   4   . EPINEPHRINE 0.3 MG/0.3ML IJ DEVI   Intramuscular   Inject 0.3 mg into the muscle once as needed. For allergic reaction         . OXYCODONE-ACETAMINOPHEN 5-325 MG PO TABS   Oral   Take 1 tablet by mouth every 6 (six) hours as needed for pain.   15 tablet   0     BP 100/59  Pulse 83  Temp 98.5 F (36.9 C) (Oral)  Resp 17  SpO2 99%  Physical Exam  Nursing note and vitals reviewed. Constitutional: She is oriented to person, place, and time. She appears well-developed and well-nourished. No distress.  HENT:  Head: Normocephalic and atraumatic.  Eyes: Pupils are equal, round, and reactive to light.  Neck: Normal range of motion. Neck supple.  Cardiovascular: Normal rate and regular rhythm.   Pulmonary/Chest: Effort normal.  Abdominal: Soft.  Neurological: She is alert and oriented to person,  place, and time. No cranial nerve deficit. She exhibits normal muscle tone. Coordination normal.  Skin: Skin is warm and dry.    ED Course  Procedures (including critical care time)  Labs Reviewed - No data to display No results found.   1. Intractable migraine       MDM  Given Decadron, Benadryl, 1 L of normal saline and 1 mg of Dilaudid.  Pt given multiple rounds of Dilaudid, Toradol, Benadryl and Zofran without significant relief. She says this is normal for her.  Will give referral to Neurology and a few Percocet and pt agrees to be discharged as she does not want to be admitted.  Pt has been advised of the symptoms that warrant their return to the ED. Patient has voiced understanding and has agreed to follow-up with the PCP or specialist.          Dorthula Matas, PA 07/14/12 [phone]

## 2012-07-14 MED ORDER — DIPHENHYDRAMINE HCL 50 MG/ML IJ SOLN
25.0000 mg | Freq: Once | INTRAMUSCULAR | Status: AC
  Administered 2012-07-14: 25 mg via INTRAVENOUS
  Filled 2012-07-14: qty 1

## 2012-07-14 MED ORDER — OXYCODONE-ACETAMINOPHEN 5-325 MG PO TABS: 1.0000 | ORAL_TABLET | Freq: Four times a day (QID) | ORAL | Status: DC | PRN

## 2012-07-14 MED ORDER — KETOROLAC TROMETHAMINE 30 MG/ML IJ SOLN
30.0000 mg | Freq: Once | INTRAMUSCULAR | Status: AC
Start: 2012-07-14 — End: 2012-07-14
  Administered 2012-07-14: 30 mg via INTRAVENOUS
  Filled 2012-07-14: qty 1

## 2012-07-14 NOTE — ED Notes (Signed)
Back to room to update pt and pt not in room.

## 2012-07-14 NOTE — ED Notes (Signed)
Tiffany, PA in to see pt.

## 2012-07-14 NOTE — ED Provider Notes (Signed)
Medical screening examination/treatment/procedure(s) were performed by non-physician practitioner and as supervising physician I was immediately available for consultation/collaboration.   [name], MD 07/14/12 1122

## 2012-07-14 NOTE — ED Notes (Signed)
In to discharge pt, prescriptions reviewed. Pt asking to see Tiffany, PA prior to discharge.

## 2012-07-25 ENCOUNTER — Emergency Department (HOSPITAL_COMMUNITY)
Admission: EM | Admit: 2012-07-25 | Discharge: 2012-07-25 | Disposition: A | Discharge status: Home / Self Care | Payer: Medicaid Other | Attending: Emergency Medicine | Admitting: Emergency Medicine

## 2012-07-25 ENCOUNTER — Encounter (HOSPITAL_COMMUNITY): Payer: Self-pay

## 2012-07-25 DIAGNOSIS — Z8719 Personal history of other diseases of the digestive system: Secondary | ICD-10-CM | POA: Insufficient documentation

## 2012-07-25 DIAGNOSIS — Z87448 Personal history of other diseases of urinary system: Secondary | ICD-10-CM | POA: Insufficient documentation

## 2012-07-25 DIAGNOSIS — K224 Dyskinesia of esophagus: Secondary | ICD-10-CM

## 2012-07-25 DIAGNOSIS — R079 Chest pain, unspecified: Secondary | ICD-10-CM | POA: Insufficient documentation

## 2012-07-25 DIAGNOSIS — R51 Headache: Secondary | ICD-10-CM | POA: Insufficient documentation

## 2012-07-25 DIAGNOSIS — I10 Essential (primary) hypertension: Secondary | ICD-10-CM | POA: Insufficient documentation

## 2012-07-25 DIAGNOSIS — G43909 Migraine, unspecified, not intractable, without status migrainosus: Secondary | ICD-10-CM | POA: Insufficient documentation

## 2012-07-25 DIAGNOSIS — Z79899 Other long term (current) drug therapy: Secondary | ICD-10-CM | POA: Insufficient documentation

## 2012-07-25 DIAGNOSIS — R404 Transient alteration of awareness: Secondary | ICD-10-CM | POA: Insufficient documentation

## 2012-07-25 DIAGNOSIS — R1110 Vomiting, unspecified: Secondary | ICD-10-CM | POA: Insufficient documentation

## 2012-07-25 DIAGNOSIS — R55 Syncope and collapse: Secondary | ICD-10-CM | POA: Insufficient documentation

## 2012-07-25 LAB — CBC WITH DIFFERENTIAL/PLATELET
Basophils Relative: 0 % (ref 0–1)
Eosinophils Absolute: 0.1 10*3/uL (ref 0.0–0.7)
Eosinophils Relative: 2 % (ref 0–5)
HCT: 41.8 % (ref 36.0–46.0)
Hemoglobin: 14.2 g/dL (ref 12.0–15.0)
Lymphs Abs: 1.8 10*3/uL (ref 0.7–4.0)
MCHC: 34 g/dL (ref 30.0–36.0)
Monocytes Relative: 6 % (ref 3–12)
Platelets: 211 10*3/uL (ref 150–400)
WBC: 4.6 10*3/uL (ref 4.0–10.5)

## 2012-07-25 LAB — BASIC METABOLIC PANEL
BUN: 7 mg/dL (ref 6–23)
Calcium: 9.5 mg/dL (ref 8.4–10.5)
GFR calc non Af Amer: 73 mL/min — ABNORMAL LOW (ref >90)
Glucose, Bld: 89 mg/dL (ref 70–99)
Sodium: 138 mEq/L (ref 135–145)

## 2012-07-25 LAB — URINALYSIS, ROUTINE W REFLEX MICROSCOPIC
Bilirubin Urine: NEGATIVE
Hgb urine dipstick: NEGATIVE
Protein, ur: NEGATIVE mg/dL
Urobilinogen, UA: 0.2 mg/dL (ref 0.0–1.0)

## 2012-07-25 MED ORDER — PROCHLORPERAZINE EDISYLATE 5 MG/ML IJ SOLN
10.0000 mg | Freq: Once | INTRAMUSCULAR | Status: AC
  Administered 2012-07-25: 10 mg via INTRAVENOUS
  Filled 2012-07-25: qty 2

## 2012-07-25 MED ORDER — FENTANYL CITRATE 0.05 MG/ML IJ SOLN
50.0000 ug | Freq: Once | INTRAMUSCULAR | Status: AC
  Administered 2012-07-25: 50 ug via INTRAVENOUS
  Filled 2012-07-25: qty 2

## 2012-07-25 MED ORDER — DIPHENHYDRAMINE HCL 50 MG/ML IJ SOLN
25.0000 mg | Freq: Once | INTRAMUSCULAR | Status: AC
  Administered 2012-07-25: 25 mg via INTRAVENOUS
  Filled 2012-07-25: qty 1

## 2012-07-25 MED ORDER — DEXAMETHASONE SODIUM PHOSPHATE 10 MG/ML IJ SOLN
10.0000 mg | Freq: Once | INTRAMUSCULAR | Status: AC
  Administered 2012-07-25: 10 mg via INTRAVENOUS
  Filled 2012-07-25: qty 1

## 2012-07-25 MED ORDER — VALPROATE SODIUM 500 MG/5ML IV SOLN
500.0000 mg | INTRAVENOUS | Status: AC
  Administered 2012-07-25: 500 mg via INTRAVENOUS
  Filled 2012-07-25: qty 5

## 2012-07-25 MED ORDER — GI COCKTAIL ~~LOC~~
30.0000 mL | Freq: Once | ORAL | Status: AC
  Administered 2012-07-25: 30 mL via ORAL
  Filled 2012-07-25: qty 30

## 2012-07-25 MED ORDER — SODIUM CHLORIDE 0.9 % IV BOLUS (SEPSIS)
1000.0000 mL | Freq: Once | INTRAVENOUS | Status: AC
  Administered 2012-07-25: 1000 mL via INTRAVENOUS

## 2012-07-25 NOTE — ED Notes (Addendum)
Per [facility], pt has a hx of migraine HA and were called out earlier today but the pt refused transport. Called back out about an hour PTA for same and vomiting. Family reported that the patient passed out but pt denies. Did report some fluttering in her chest which diminished after being placed on oxygen at 2 liters. VSS. Pt informed nurse that she woke up on the floor.

## 2012-07-25 NOTE — ED Provider Notes (Signed)
History     CSN: [id number]  Arrival date & time 07/25/12  1533   First MD Initiated Contact with Patient 07/25/12 1540      Chief Complaint  Patient presents with  . Migraine  . Loss of Consciousness    (Consider location/radiation/quality/duration/timing/severity/associated sxs/prior treatment) Patient is a 52 y.o. female presenting with migraines. The history is provided by the patient.  Migraine This is a chronic problem. The current episode started today. The problem occurs constantly. The problem has been gradually worsening. Associated symptoms include chest pain, headaches and vomiting. Pertinent negatives include no abdominal pain, arthralgias, congestion, fatigue, fever, nausea, rash or weakness. Associated symptoms comments: Chest pain and syncope. Nothing aggravates the symptoms. She has tried lying down, oral narcotics and NSAIDs for the symptoms. The treatment provided no relief.    Past Medical History  Diagnosis Date  . Migraine   . Hypertension   . Migraine   . Chest pain   . Gall stones   . Renal disorder     Past Surgical History  Procedure Date  . Cholecystectomy   . Kidney stone surgery     Family History  Problem Relation Age of Onset  . Cancer Mother   . Cirrhosis Father     History  Substance Use Topics  . Smoking status: Never Smoker   . Smokeless tobacco: Never Used  . Alcohol Use: Yes     Comment: occassionally    OB History    Grav Para Term Preterm Abortions TAB SAB Ect Mult Living                  Review of Systems  Constitutional: Negative for fever and fatigue.  HENT: Negative for congestion, rhinorrhea and postnasal drip.   Eyes: Negative for photophobia and visual disturbance.  Respiratory: Negative for chest tightness, shortness of breath and wheezing.   Cardiovascular: Positive for chest pain. Negative for palpitations and leg swelling.  Gastrointestinal: Positive for vomiting. Negative for nausea, abdominal pain and  diarrhea.  Genitourinary: Negative for urgency, frequency and difficulty urinating.  Musculoskeletal: Negative for back pain and arthralgias.  Skin: Negative for rash and wound.  Neurological: Positive for syncope and headaches. Negative for weakness.  Psychiatric/Behavioral: Negative for confusion and agitation.    Allergies  Apple; Aspirin; and Carrot  Home Medications   Current Outpatient Rx  Name  Route  Sig  Dispense  Refill  . CLONIDINE HCL 0.1 MG/24HR TD PTWK   Transdermal   Place 1 patch onto the skin once a week. Applied on Mondays.         Marland Kitchen EPINEPHRINE 0.3 MG/0.3ML IJ DEVI   Intramuscular   Inject 0.3 mg into the muscle once as needed. For allergic reaction         . LISINOPRIL-HYDROCHLOROTHIAZIDE 20-12.5 MG PO TABS   Oral   Take 2 tablets by mouth daily.   30 tablet   11   . METOPROLOL TARTRATE 25 MG PO TABS   Oral   Take 12.5 mg by mouth 2 (two) times daily.         Marland Kitchen ONDANSETRON 4 MG PO TBDP   Oral   Take 1 tablet (4 mg total) by mouth every 6 (six) hours as needed for nausea.   30 tablet   0   . OXYCODONE-ACETAMINOPHEN 5-325 MG PO TABS   Oral   Take 1 tablet by mouth every 6 (six) hours as needed for pain.   15 tablet  0   . PREGABALIN 50 MG PO CAPS   Oral   Take 1 capsule (50 mg total) by mouth 3 (three) times daily.   90 capsule   2   . TOPIRAMATE 100 MG PO TABS   Oral   Take 1 tablet (100 mg total) by mouth 2 (two) times daily.   60 tablet   4     BP 134/78  Pulse 66  Temp 98 F (36.7 C) (Oral)  Resp 18  Ht 5\' 5"  (1.651 m)  Wt 197 lb (89.359 kg)  BMI 32.78 kg/m2  SpO2 99%  Physical Exam  Nursing note and vitals reviewed. Constitutional: She is oriented to person, place, and time. She appears well-developed and well-nourished. No distress.  HENT:  Head: Normocephalic and atraumatic.  Mouth/Throat: Oropharynx is clear and moist.  Eyes: EOM are normal. Pupils are equal, round, and reactive to light.  Neck: Normal range  of motion. Neck supple.  Cardiovascular: Normal rate, regular rhythm, normal heart sounds and intact distal pulses.   Pulmonary/Chest: Effort normal and breath sounds normal. She has no wheezes. She has no rales.  Abdominal: Soft. Bowel sounds are normal. She exhibits no distension. There is no tenderness. There is no rebound and no guarding.  Musculoskeletal: Normal range of motion. She exhibits no edema and no tenderness.  Lymphadenopathy:    She has no cervical adenopathy.  Neurological: She is alert and oriented to person, place, and time. She displays normal reflexes. No cranial nerve deficit. She exhibits normal muscle tone. Coordination normal.       Neurologic exam intact. Ambulates without difficulty. 5/5 upper and lower extremity strength. Normal sensation. Normal cerebellar exam  Skin: Skin is warm and dry. No rash noted.  Psychiatric: She has a normal mood and affect. Her behavior is normal.    ED Course  Procedures (including critical care time)   Date: 07/25/2012  Rate: 56  Rhythm: sinus bradycardia  QRS Axis: normal  Intervals: normal  ST/T Wave abnormalities: normal  Conduction Disutrbances:none  Narrative Interpretation:   Old EKG Reviewed: unchanged    Labs Reviewed  BASIC METABOLIC PANEL - Abnormal; Notable for the following:    Potassium 3.4 (*)     GFR calc non Af Amer 73 (*)     GFR calc Af Amer 84 (*)     All other components within normal limits  CBC WITH DIFFERENTIAL  URINALYSIS, ROUTINE W REFLEX MICROSCOPIC   No results found.   1. Migraine   2. Esophageal spasm       MDM   39F with chronic migraines has been on multiple migraine medications in the past including Botox currently on Topamax, clonidine patch for prevention. Today, headache got worse, had multiple episodes of vomiting, and had a syncopal episode which is not uncommmon for her. She has had syncope before for similar situations. She also developed chest pain that is sharp, 10/10,  lasts only a couple seconds then dissipates. She states it feels like a spasm. Does not radiate. Exam as noted above. Doubt ACS, PE, dissection, SAH, brain tumor, or meningitis. Likely migraine causing volume depletion which may have resulted in her syncope. Chest pain more likely GI related. EKG without ischemic changes. Will treat migraine with decadron, compazine, and benadryl along with IVF. Will obtain basic labs to look at electrolytes and renal function. Does not warrant cardiac enzymes or neuroimaging at this time.  Headache improving but still there. Will give Depakote as second line migraine  medication and GI cocktail for esophageal spasms.  Headache now only mild and pt wants to go home. Still with some chest pain but that's also improved. Will give one dose of Fentanyl and then she can go home.   Pt now pain free. Pt wants to go home. Stable for d/c. Return precautions given. Given number for neurology to f/u.      Johnnette Gourd, MD 07/26/12 [phone]

## 2012-07-26 NOTE — ED Provider Notes (Signed)
I saw and evaluated the patient, reviewed the resident's note and I agree with the findings and plan. Pt with hx migraines, states same type of headache currently. Alert, content. No temporal or sinus tenderness. rx pain.   Suzi Roots, MD 07/26/12 [phone]

## 2012-08-31 ENCOUNTER — Ambulatory Visit (INDEPENDENT_AMBULATORY_CARE_PROVIDER_SITE_OTHER): Payer: Medicaid Other | Admitting: Family Medicine

## 2012-08-31 ENCOUNTER — Encounter: Payer: Self-pay | Admitting: Family Medicine

## 2012-08-31 VITALS — BP 163/91 | HR 67 | Temp 98.3°F | Ht 65.0 in | Wt 206.0 lb

## 2012-08-31 DIAGNOSIS — G43019 Migraine without aura, intractable, without status migrainosus: Secondary | ICD-10-CM

## 2012-08-31 MED ORDER — PROMETHAZINE HCL 25 MG/ML IJ SOLN
25.0000 mg | Freq: Once | INTRAMUSCULAR | Status: AC
  Administered 2012-08-31: 25 mg via INTRAMUSCULAR

## 2012-08-31 MED ORDER — KETOROLAC TROMETHAMINE 30 MG/ML IJ SOLN
30.0000 mg | Freq: Once | INTRAMUSCULAR | Status: AC
  Administered 2012-08-31: 30 mg via INTRAMUSCULAR
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
## 2012-08-31 NOTE — Patient Instructions (Addendum)
It was nice to meet you today.  I'm sorry you aren't feeling well.  We have given you medicine (toradol and phenergan) here to help with your headache. If you do not feel any better or cannot keep liquids down tomorrow, come back to the clinic to be seen. If you get a fever, neck stiffness, cannot see, or any new weakness, numbness, or speech problems, go to the ER immediately.  Do NOT drive today, since this medicine can make you very sleepy.

## 2012-09-02 NOTE — Assessment & Plan Note (Signed)
Current migraine x 4 days. This headache is likely multifactorial (migraine, tension, musculoskeletal). Only red flag is some neck stiffness with flexion, but this appears to be musculoskeletal given that pt otherwise has ability to move neck and that she has tenderness to palpation over R posterior neck & shoulder musculature. Afebrile here in clinic and completely oriented/no AMS.   Will give toradol 30mg  IM and phenergan 25mg  IM today. Precepted with Dr. Leveda Anna, who knows patient well. Given return precautions per AVS.

## 2012-09-06 ENCOUNTER — Other Ambulatory Visit: Payer: Self-pay | Admitting: Family Medicine

## 2012-09-12 ENCOUNTER — Telehealth: Payer: Self-pay | Admitting: Family Medicine

## 2012-09-12 MED ORDER — LISINOPRIL-HYDROCHLOROTHIAZIDE 20-12.5 MG PO TABS: 2.0000 | ORAL_TABLET | Freq: Every day | ORAL | Status: DC

## 2012-09-12 NOTE — Telephone Encounter (Signed)
Message left on voicemail that rx has been resent.

## 2012-09-12 NOTE — Telephone Encounter (Signed)
Pt called to check on her refill for Lisinopril and when I looked it up, it was written for #30 tabs, but she takes 2 per day so that is not 1 month supply.  pls advise

## 2012-09-12 NOTE — Telephone Encounter (Signed)
Will forward to Dr. Lula Olszewski to clarify.

## 2012-09-12 NOTE — Telephone Encounter (Signed)
Rx for 60 tablets daily sent in- she is to take two tablets once a day.  Thanks.

## 2012-09-14 ENCOUNTER — Ambulatory Visit (INDEPENDENT_AMBULATORY_CARE_PROVIDER_SITE_OTHER): Payer: Medicaid Other | Admitting: Family Medicine

## 2012-09-14 ENCOUNTER — Encounter: Payer: Self-pay | Admitting: Family Medicine

## 2012-09-14 VITALS — BP 118/82 | HR 72 | Temp 98.4°F | Ht 65.0 in | Wt 207.0 lb

## 2012-09-14 DIAGNOSIS — G43019 Migraine without aura, intractable, without status migrainosus: Secondary | ICD-10-CM

## 2012-09-14 DIAGNOSIS — R51 Headache: Secondary | ICD-10-CM

## 2012-09-14 MED ORDER — PROMETHAZINE HCL 25 MG/ML IJ SOLN
25.0000 mg | Freq: Once | INTRAMUSCULAR | Status: AC
  Administered 2012-09-14: 25 mg via INTRAMUSCULAR

## 2012-09-14 MED ORDER — OXYCODONE-ACETAMINOPHEN 5-325 MG PO TABS: 1.0000 | ORAL_TABLET | Freq: Four times a day (QID) | ORAL | Status: DC | PRN

## 2012-09-14 MED ORDER — KETOROLAC TROMETHAMINE 30 MG/ML IJ SOLN
30.0000 mg | Freq: Once | INTRAMUSCULAR | Status: AC
  Administered 2012-09-14: 30 mg via INTRAMUSCULAR
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
## 2012-09-14 NOTE — Patient Instructions (Signed)
I'm sorry you still have the headache. We have given you Toradol and Phenergan in the office today which will hopefully help.  I gave you a few pain pills you can take as well. This is NOT a chronic medication and it will not be refilled.  Continue your Topamax and follow up with Dr. Lula Olszewski early next week if you are not feeling better. If anything changes, you can go to the ED for evaluation.  Amber M. Hairford, M.D.

## 2012-09-15 NOTE — Assessment & Plan Note (Signed)
Pt presenting with same symptoms without focal neurologic findings. Given Phenergan and Toradol in clinic. States her headache has not resolved, but she is ready to go home. Discussed patient with Dr. Mauricio Po. Patient given red flag symptoms that should prompt her return including difficulty speaking, facial droop, increased weakness or new symptoms. Patient given Rx for Percocet #15 with direct instruction that this medication is not a chronic medication and it will not be refilled. Should follow up with PCP next week.

## 2012-10-09 ENCOUNTER — Encounter (HOSPITAL_BASED_OUTPATIENT_CLINIC_OR_DEPARTMENT_OTHER): Payer: Self-pay | Admitting: *Deleted

## 2012-10-09 ENCOUNTER — Emergency Department (HOSPITAL_BASED_OUTPATIENT_CLINIC_OR_DEPARTMENT_OTHER)
Admission: EM | Admit: 2012-10-09 | Discharge: 2012-10-10 | Disposition: A | Discharge status: Home / Self Care | Payer: Medicaid Other | Attending: Emergency Medicine | Admitting: Emergency Medicine

## 2012-10-09 DIAGNOSIS — G43909 Migraine, unspecified, not intractable, without status migrainosus: Secondary | ICD-10-CM

## 2012-10-09 DIAGNOSIS — Z87448 Personal history of other diseases of urinary system: Secondary | ICD-10-CM | POA: Insufficient documentation

## 2012-10-09 DIAGNOSIS — Z79899 Other long term (current) drug therapy: Secondary | ICD-10-CM | POA: Insufficient documentation

## 2012-10-09 DIAGNOSIS — G43709 Chronic migraine without aura, not intractable, without status migrainosus: Secondary | ICD-10-CM | POA: Insufficient documentation

## 2012-10-09 DIAGNOSIS — H539 Unspecified visual disturbance: Secondary | ICD-10-CM | POA: Insufficient documentation

## 2012-10-09 DIAGNOSIS — Z87442 Personal history of urinary calculi: Secondary | ICD-10-CM | POA: Insufficient documentation

## 2012-10-09 DIAGNOSIS — R1110 Vomiting, unspecified: Secondary | ICD-10-CM | POA: Insufficient documentation

## 2012-10-09 DIAGNOSIS — I10 Essential (primary) hypertension: Secondary | ICD-10-CM | POA: Insufficient documentation

## 2012-10-09 MED ORDER — PROMETHAZINE HCL 25 MG/ML IJ SOLN
25.0000 mg | Freq: Once | INTRAMUSCULAR | Status: AC
  Administered 2012-10-10: 25 mg via INTRAMUSCULAR
  Filled 2012-10-09: qty 1

## 2012-10-09 MED ORDER — DEXAMETHASONE SODIUM PHOSPHATE 10 MG/ML IJ SOLN
10.0000 mg | Freq: Once | INTRAMUSCULAR | Status: AC
  Administered 2012-10-10: 10 mg via INTRAMUSCULAR
  Filled 2012-10-09: qty 1

## 2012-10-09 NOTE — ED Notes (Signed)
Pt c/o migraine x4 days

## 2012-10-09 NOTE — ED Provider Notes (Signed)
History    This chart was scribed for Sharlynn Seckinger Smitty Cords, MD by Leone Payor, ED Scribe. This patient was seen in room MH12/MH12 and the patient's care was started 11:48 PM.   CSN: [id number]  Arrival date & time 10/09/12  2009   First MD Initiated Contact with Patient 10/09/12 2308      Chief Complaint  Patient presents with  . Migraine     Patient is a 52 y.o. female presenting with migraines. The history is provided by the patient. No language interpreter was used.  Migraine This is a chronic problem. The current episode started more than 2 days ago. The problem occurs constantly. The problem has not changed since onset.Associated symptoms include headaches. Nothing aggravates the symptoms. Nothing relieves the symptoms. She has tried nothing for the symptoms. The treatment provided no relief.    Kirsten Walsh is a 52 y.o. female who presents to the Emergency Department complaining of a new episode of migraine starting 4 days ago. States the migraine is behind R eye that radiates back and to the R ear. Reports having them almost daily. Takes Topamax regularly and states she has not missed any doses. She has associated vomiting and sees light flashes.   Pt has h/o migraines, HTN.  Pt is an occasional alcohol user but denies smoking.  Past Medical History  Diagnosis Date  . Migraine   . Hypertension   . Migraine   . Chest pain   . Gall stones   . Renal disorder     Past Surgical History  Procedure Laterality Date  . Cholecystectomy    . Kidney stone surgery      Family History  Problem Relation Age of Onset  . Cancer Mother   . Cirrhosis Father     History  Substance Use Topics  . Smoking status: Never Smoker   . Smokeless tobacco: Never Used  . Alcohol Use: Yes     Comment: occassionally    No OB history provided.   Review of Systems  Constitutional: Negative for fever.  HENT: Negative for neck pain and neck stiffness.   Eyes: Negative for  photophobia.  Gastrointestinal: Positive for vomiting.  Neurological: Positive for headaches. Negative for facial asymmetry, weakness and numbness.  All other systems reviewed and are negative.    Allergies  Apple; Aspirin; and Carrot  Home Medications   Current Outpatient Rx  Name  Route  Sig  Dispense  Refill  . cloNIDine (CATAPRES - DOSED IN MG/24 HR) 0.1 mg/24hr patch   Transdermal   Place 1 patch onto the skin once a week. Applied on Mondays.         Marland Kitchen EPINEPHrine (EPIPEN) 0.3 mg/0.3 mL DEVI   Intramuscular   Inject 0.3 mg into the muscle once as needed. For allergic reaction         . lisinopril-hydrochlorothiazide (PRINZIDE,ZESTORETIC) 20-12.5 MG per tablet   Oral   Take 2 tablets by mouth daily.   60 tablet   5   . metoprolol tartrate (LOPRESSOR) 25 MG tablet   Oral   Take 12.5 mg by mouth 2 (two) times daily.         . ondansetron (ZOFRAN ODT) 4 MG disintegrating tablet   Oral   Take 1 tablet (4 mg total) by mouth every 6 (six) hours as needed for nausea.   30 tablet   0   . oxyCODONE-acetaminophen (PERCOCET/ROXICET) 5-325 MG per tablet   Oral   Take 1 tablet  by mouth every 6 (six) hours as needed for pain.   15 tablet   0   . EXPIRED: pregabalin (LYRICA) 50 MG capsule   Oral   Take 1 capsule (50 mg total) by mouth 3 (three) times daily.   90 capsule   2   . topiramate (TOPAMAX) 100 MG tablet   Oral   Take 1 tablet (100 mg total) by mouth 2 (two) times daily.   60 tablet   4     BP 132/88  Pulse 84  Temp(Src) 98.5 F (36.9 C) (Oral)  Resp 16  Ht 5\' 5"  (1.651 m)  Wt 198 lb (89.812 kg)  BMI 32.95 kg/m2  SpO2 100%  Physical Exam  Nursing note and vitals reviewed. Constitutional: She is oriented to person, place, and time. She appears well-developed and well-nourished.  HENT:  Head: Normocephalic and atraumatic.  Right Ear: External ear normal.  Left Ear: External ear normal.  Mouth/Throat: Oropharynx is clear and moist.  No  temporal bulging.   Eyes: EOM are normal. Pupils are equal, round, and reactive to light.  Neck: Normal range of motion. Neck supple. No tracheal deviation present.  Cardiovascular: Normal rate, regular rhythm, normal heart sounds and intact distal pulses.   Pulmonary/Chest: Effort normal and breath sounds normal. No stridor. No respiratory distress. She has no wheezes.  Abdominal: Soft. Bowel sounds are normal. She exhibits no distension. There is no tenderness.  Musculoskeletal: Normal range of motion.  Lymphadenopathy:    She has no cervical adenopathy.  Neurological: She is alert and oriented to person, place, and time. She has normal reflexes. No cranial nerve deficit.  Cranial nerves 2-12 intact.   Skin: Skin is warm and dry. No rash noted.  Psychiatric: She has a normal mood and affect.    ED Course  Procedures (including critical care time)  DIAGNOSTIC STUDIES: Oxygen Saturation is 100% on room air, normal by my interpretation.    COORDINATION OF CARE: 11:09 PM-Discussed treatment plan with pt at bedside and pt agreed to plan.    Labs Reviewed - No data to display No results found.   No diagnosis found.    MDM  Migraine improved post meds.  No indication for imaging or LP.  Return for worsening symptoms    I personally performed the services described in this documentation, which was scribed in my presence. The recorded information has been reviewed and is accurate.    Jasmine Awe, MD 10/17/12 [phone]

## 2012-10-10 MED ORDER — KETOROLAC TROMETHAMINE 60 MG/2ML IM SOLN
60.0000 mg | Freq: Once | INTRAMUSCULAR | Status: AC
  Administered 2012-10-10: 60 mg via INTRAMUSCULAR
  Filled 2012-10-10: qty 2

## 2012-10-10 NOTE — ED Notes (Signed)
MD at bedside. 

## 2012-10-16 ENCOUNTER — Emergency Department (HOSPITAL_BASED_OUTPATIENT_CLINIC_OR_DEPARTMENT_OTHER)
Admission: EM | Admit: 2012-10-16 | Discharge: 2012-10-16 | Disposition: A | Discharge status: Home / Self Care | Payer: Medicaid Other | Attending: Emergency Medicine | Admitting: Emergency Medicine

## 2012-10-16 ENCOUNTER — Encounter (HOSPITAL_BASED_OUTPATIENT_CLINIC_OR_DEPARTMENT_OTHER): Payer: Self-pay | Admitting: Family Medicine

## 2012-10-16 DIAGNOSIS — Z8719 Personal history of other diseases of the digestive system: Secondary | ICD-10-CM | POA: Insufficient documentation

## 2012-10-16 DIAGNOSIS — I10 Essential (primary) hypertension: Secondary | ICD-10-CM | POA: Insufficient documentation

## 2012-10-16 DIAGNOSIS — Z87448 Personal history of other diseases of urinary system: Secondary | ICD-10-CM | POA: Insufficient documentation

## 2012-10-16 DIAGNOSIS — H53149 Visual discomfort, unspecified: Secondary | ICD-10-CM | POA: Insufficient documentation

## 2012-10-16 DIAGNOSIS — Z79899 Other long term (current) drug therapy: Secondary | ICD-10-CM | POA: Insufficient documentation

## 2012-10-16 DIAGNOSIS — G43909 Migraine, unspecified, not intractable, without status migrainosus: Secondary | ICD-10-CM | POA: Insufficient documentation

## 2012-10-16 MED ORDER — DEXAMETHASONE SODIUM PHOSPHATE 10 MG/ML IJ SOLN
10.0000 mg | Freq: Once | INTRAMUSCULAR | Status: DC
  Filled 2012-10-16: qty 1

## 2012-10-16 MED ORDER — ONDANSETRON 8 MG PO TBDP: 8.0000 mg | ORAL_TABLET | Freq: Once | ORAL | Status: AC

## 2012-10-16 MED ORDER — ONDANSETRON 8 MG PO TBDP
ORAL_TABLET | ORAL | Status: AC
  Administered 2012-10-16: 8 mg via ORAL
  Filled 2012-10-16: qty 1

## 2012-10-16 MED ORDER — OXYCODONE-ACETAMINOPHEN 5-325 MG PO TABS: 1.0000 | ORAL_TABLET | ORAL | Status: DC | PRN

## 2012-10-16 MED ORDER — DIPHENHYDRAMINE HCL 50 MG/ML IJ SOLN
12.5000 mg | Freq: Once | INTRAMUSCULAR | Status: DC
  Filled 2012-10-16: qty 1

## 2012-10-16 MED ORDER — METOCLOPRAMIDE HCL 5 MG/ML IJ SOLN
10.0000 mg | Freq: Once | INTRAMUSCULAR | Status: DC
  Filled 2012-10-16: qty 2

## 2012-10-16 MED ORDER — DEXAMETHASONE SODIUM PHOSPHATE 10 MG/ML IJ SOLN
10.0000 mg | Freq: Once | INTRAMUSCULAR | Status: AC
  Administered 2012-10-16: 10 mg via INTRAMUSCULAR

## 2012-10-16 MED ORDER — SODIUM CHLORIDE 0.9 % IV SOLN: Freq: Once | INTRAVENOUS | Status: DC

## 2012-10-16 MED ORDER — METOCLOPRAMIDE HCL 5 MG/ML IJ SOLN
10.0000 mg | Freq: Once | INTRAMUSCULAR | Status: AC
  Administered 2012-10-16: 10 mg via INTRAMUSCULAR

## 2012-10-16 MED ORDER — DIPHENHYDRAMINE HCL 50 MG/ML IJ SOLN
25.0000 mg | Freq: Once | INTRAMUSCULAR | Status: AC
  Administered 2012-10-16: 25 mg via INTRAMUSCULAR

## 2012-10-16 NOTE — ED Provider Notes (Signed)
History     CSN: [id number]  Arrival date & time 10/16/12  1155   First MD Initiated Contact with Patient 10/16/12 1234      Chief Complaint  Patient presents with  . Migraine    (Consider location/radiation/quality/duration/timing/severity/associated sxs/prior treatment) Patient is a 52 y.o. female presenting with migraines. The history is provided by the patient. No language interpreter was used.  Migraine This is a new problem. The current episode started 1 to 4 weeks ago. The problem occurs constantly. Associated symptoms include headaches. Pertinent negatives include no chills, fever, nausea or vomiting. Associated symptoms comments: Right sided headache for the past 1 week typical of her migraine history.. Exacerbated bySherlynn Stalls makes headache worse.    Past Medical History  Diagnosis Date  . Migraine   . Hypertension   . Migraine   . Chest pain   . Gall stones   . Renal disorder     Past Surgical History  Procedure Laterality Date  . Cholecystectomy    . Kidney stone surgery      Family History  Problem Relation Age of Onset  . Cancer Mother   . Cirrhosis Father     History  Substance Use Topics  . Smoking status: Never Smoker   . Smokeless tobacco: Never Used  . Alcohol Use: Yes     Comment: occassionally    OB History   Grav Para Term Preterm Abortions TAB SAB Ect Mult Living                  Review of Systems  Constitutional: Negative for fever and chills.  Eyes: Positive for photophobia. Negative for visual disturbance.  Respiratory: Negative.   Cardiovascular: Negative.   Gastrointestinal: Negative for nausea and vomiting.  Musculoskeletal: Negative.   Skin: Negative.   Neurological: Positive for headaches.    Allergies  Apple; Aspirin; and Carrot  Home Medications   Current Outpatient Rx  Name  Route  Sig  Dispense  Refill  . cloNIDine (CATAPRES - DOSED IN MG/24 HR) 0.1 mg/24hr patch   Transdermal   Place 1 patch onto the skin  once a week. Applied on Mondays.         Marland Kitchen EPINEPHrine (EPIPEN) 0.3 mg/0.3 mL DEVI   Intramuscular   Inject 0.3 mg into the muscle once as needed. For allergic reaction         . lisinopril-hydrochlorothiazide (PRINZIDE,ZESTORETIC) 20-12.5 MG per tablet   Oral   Take 2 tablets by mouth daily.   60 tablet   5   . metoprolol tartrate (LOPRESSOR) 25 MG tablet   Oral   Take 12.5 mg by mouth 2 (two) times daily.         . ondansetron (ZOFRAN ODT) 4 MG disintegrating tablet   Oral   Take 1 tablet (4 mg total) by mouth every 6 (six) hours as needed for nausea.   30 tablet   0   . oxyCODONE-acetaminophen (PERCOCET/ROXICET) 5-325 MG per tablet   Oral   Take 1 tablet by mouth every 6 (six) hours as needed for pain.   15 tablet   0   . EXPIRED: pregabalin (LYRICA) 50 MG capsule   Oral   Take 1 capsule (50 mg total) by mouth 3 (three) times daily.   90 capsule   2   . topiramate (TOPAMAX) 100 MG tablet   Oral   Take 1 tablet (100 mg total) by mouth 2 (two) times daily.   60 tablet  4     BP 152/88  Pulse 105  Temp(Src) 98.6 F (37 C) (Oral)  Resp 18  SpO2 100%  Physical Exam  Constitutional: She is oriented to person, place, and time. She appears well-developed and well-nourished.  HENT:  Head: Normocephalic.  Eyes: Pupils are equal, round, and reactive to light.  Neck: Normal range of motion. Neck supple.  Cardiovascular: Normal rate and regular rhythm.   Pulmonary/Chest: Effort normal and breath sounds normal.  Abdominal: Soft. Bowel sounds are normal. There is no tenderness. There is no rebound and no guarding.  Musculoskeletal: Normal range of motion.  Neurological: She is alert and oriented to person, place, and time. She has normal strength and normal reflexes. No sensory deficit. She displays a negative Romberg sign. Coordination normal.  Skin: Skin is warm and dry. No rash noted.  Psychiatric: She has a normal mood and affect.    ED Course   Procedures (including critical care time)  Labs Reviewed - No data to display No results found.   No diagnosis found.  1. Migraine headache   MDM  Headache is improved with medications, which had to be given IM secondary to poor IV access. Nausea is improved. Stable for discharge.         Arnoldo Hooker, PA-C 10/16/12 1604

## 2012-10-16 NOTE — ED Notes (Signed)
Pt c/o nausea. PA notified and order received.

## 2012-10-16 NOTE — ED Notes (Addendum)
Pt brought via [facility] to room 3. Pt c/o right sided migraine since last week, ongoing and constant. Pt sts she was seen here last week for same. Pt sts she has also been vomiting at times.

## 2012-10-17 NOTE — ED Provider Notes (Signed)
Medical screening examination/treatment/procedure(s) were performed by non-physician practitioner and as supervising physician I was immediately available for consultation/collaboration.    Shelda Jakes, MD 10/17/12 [phone]

## 2012-12-07 ENCOUNTER — Telehealth: Payer: Self-pay | Admitting: Family Medicine

## 2012-12-07 NOTE — Telephone Encounter (Signed)
Front Desk at [facility] called to get NPI auth for this patient who said she was going to have the PCP changed on her card from [facility] to [facility] and she was asking for 1 time authorization.  I let her know that I would then take the patient out of our system as a patient.  The [employer] asked me to hold while she let the patient know, and then she came back on the phone to let me know that the patient did not want this, but she still wanted to be seen at their office today.  I let them know that if the patient was seen at their office today, I would have to remove her as a patient here so they chose to send the patient out of their office and the patient was not seen there.

## 2013-01-01 ENCOUNTER — Ambulatory Visit (INDEPENDENT_AMBULATORY_CARE_PROVIDER_SITE_OTHER): Payer: Medicaid Other | Admitting: Family Medicine

## 2013-01-01 ENCOUNTER — Encounter: Payer: Self-pay | Admitting: Family Medicine

## 2013-01-01 VITALS — BP 138/90 | HR 83 | Temp 99.7°F | Wt 202.8 lb

## 2013-01-01 DIAGNOSIS — G43019 Migraine without aura, intractable, without status migrainosus: Secondary | ICD-10-CM

## 2013-01-01 MED ORDER — SUMATRIPTAN SUCCINATE 50 MG PO TABS: 50.0000 mg | ORAL_TABLET | ORAL | Status: DC | PRN

## 2013-01-01 MED ORDER — ONDANSETRON 4 MG PO TBDP: 4.0000 mg | ORAL_TABLET | Freq: Four times a day (QID) | ORAL | Status: DC | PRN

## 2013-01-01 MED ORDER — KETOROLAC TROMETHAMINE 60 MG/2ML IM SOLN
60.0000 mg | Freq: Once | INTRAMUSCULAR | Status: AC
  Administered 2013-01-01: 60 mg via INTRAMUSCULAR

## 2013-01-01 MED ORDER — PROMETHAZINE HCL 25 MG/ML IJ SOLN
25.0000 mg | Freq: Once | INTRAMUSCULAR | Status: AC
  Administered 2013-01-01: 25 mg via INTRAMUSCULAR

## 2013-01-01 NOTE — Progress Notes (Signed)
Subjective:     Patient ID: ARACELI ARANGO, female   DOB: [birth year], 52 y.o.   MRN: [id number]  HPI 52 year old female with chronic migraine presents for a work in visit.  1) Migraine - Recurrent ED visits for this - Patient continues to have daily migraines despite PPx with Topamax and Metoprolol - Migraine currently - right sided.  Pain 10/10 in severity with associated nausea. - No red flags: no fevers, chills, neck stiffness, neurological signs (weakness/numbness)  Review of Systems Per HPI.    Objective:   Physical Exam Filed Vitals:   01/01/13 1450  BP: 138/90  Pulse: 83  Temp: 99.7 F (37.6 C)   General: appears in NAD HEENT: NCAT. PERRLA. Sensitive to light Heart: RRR. No m/r/g Lungs: CTAB.  Extremities: No LE edema Neuro: no focal deficits on exam.     Assessment:     See Prob List    Plan:

## 2013-01-01 NOTE — Assessment & Plan Note (Signed)
This appears to be a chronic issue for patient. Gave IM Toradol and Phenergan today.  Rx sent for Imitrex (abortive therapy). Patient advised to follow up this week.  Patient will likely need referral to neurologist or headache clinic.

## 2013-01-01 NOTE — Patient Instructions (Addendum)
It was nice meeting you today.  I'm sorry that you're having difficulty with your migraines.  Please continue to take your Topamax and Metoprolol daily.  I have prescribed some imitrex for abortive therapy.  Please be sure to follow up with your PCP this week.   Migraine Headache A migraine headache is an intense, throbbing pain on one or both sides of your head. A migraine can last for 30 minutes to several hours. CAUSES  The exact cause of a migraine headache is not always known. However, a migraine may be caused when nerves in the brain become irritated and release chemicals that cause inflammation. This causes pain. SYMPTOMS  Pain on one or both sides of your head.  Pulsating or throbbing pain.  Severe pain that prevents daily activities.  Pain that is aggravated by any physical activity.  Nausea, vomiting, or both.  Dizziness.  Pain with exposure to bright lights, loud noises, or activity.  General sensitivity to bright lights, loud noises, or smells. Before you get a migraine, you may get warning signs that a migraine is coming (aura). An aura may include:  Seeing flashing lights.  Seeing bright spots, halos, or zig-zag lines.  Having tunnel vision or blurred vision.  Having feelings of numbness or tingling.  Having trouble talking.  Having muscle weakness. MIGRAINE TRIGGERS  Alcohol.  Smoking.  Stress.  Menstruation.  Aged cheeses.  Foods or drinks that contain nitrates, glutamate, aspartame, or tyramine.  Lack of sleep.  Chocolate.  Caffeine.  Hunger.  Physical exertion.  Fatigue.  Medicines used to treat chest pain (nitroglycerine), birth control pills, estrogen, and some blood pressure medicines. DIAGNOSIS  A migraine headache is often diagnosed based on:  Symptoms.  Physical examination.  A CT scan or MRI of your head. TREATMENT Medicines may be given for pain and nausea. Medicines can also be given to help prevent recurrent  migraines.  HOME CARE INSTRUCTIONS  Only take over-the-counter or prescription medicines for pain or discomfort as directed by your caregiver. The use of long-term narcotics is not recommended.  Lie down in a dark, quiet room when you have a migraine.  Keep a journal to find out what may trigger your migraine headaches. For example, write down:  What you eat and drink.  How much sleep you get.  Any change to your diet or medicines.  Limit alcohol consumption.  Quit smoking if you smoke.  Get 7 to 9 hours of sleep, or as recommended by your caregiver.  Limit stress.  Keep lights dim if bright lights bother you and make your migraines worse. SEEK IMMEDIATE MEDICAL CARE IF:   Your migraine becomes severe.  You have a fever.  You have a stiff neck.  You have vision loss.  You have muscular weakness or loss of muscle control.  You start losing your balance or have trouble walking.  You feel faint or pass out.  You have severe symptoms that are different from your first symptoms. MAKE SURE YOU:   Understand these instructions.  Will watch your condition.  Will get help right away if you are not doing well or get worse. Document Released: 06/07/2005 Document Revised: 08/30/2011 Document Reviewed: 05/28/2011 [facility] Patient Information 2014 [place], [place].

## 2013-01-04 ENCOUNTER — Emergency Department (HOSPITAL_BASED_OUTPATIENT_CLINIC_OR_DEPARTMENT_OTHER)
Admission: EM | Admit: 2013-01-04 | Discharge: 2013-01-04 | Discharge status: Against Medical Advice | Payer: Medicaid Other | Attending: Emergency Medicine | Admitting: Emergency Medicine

## 2013-01-04 ENCOUNTER — Emergency Department (HOSPITAL_BASED_OUTPATIENT_CLINIC_OR_DEPARTMENT_OTHER): Payer: Medicaid Other

## 2013-01-04 ENCOUNTER — Encounter (HOSPITAL_BASED_OUTPATIENT_CLINIC_OR_DEPARTMENT_OTHER): Payer: Self-pay

## 2013-01-04 ENCOUNTER — Other Ambulatory Visit: Payer: Self-pay

## 2013-01-04 DIAGNOSIS — Z8719 Personal history of other diseases of the digestive system: Secondary | ICD-10-CM | POA: Insufficient documentation

## 2013-01-04 DIAGNOSIS — Y9301 Activity, walking, marching and hiking: Secondary | ICD-10-CM | POA: Insufficient documentation

## 2013-01-04 DIAGNOSIS — R296 Repeated falls: Secondary | ICD-10-CM | POA: Insufficient documentation

## 2013-01-04 DIAGNOSIS — S0990XA Unspecified injury of head, initial encounter: Secondary | ICD-10-CM | POA: Insufficient documentation

## 2013-01-04 DIAGNOSIS — R5383 Other fatigue: Secondary | ICD-10-CM | POA: Insufficient documentation

## 2013-01-04 DIAGNOSIS — Y9239 Other specified sports and athletic area as the place of occurrence of the external cause: Secondary | ICD-10-CM | POA: Insufficient documentation

## 2013-01-04 DIAGNOSIS — Z79899 Other long term (current) drug therapy: Secondary | ICD-10-CM | POA: Insufficient documentation

## 2013-01-04 DIAGNOSIS — I10 Essential (primary) hypertension: Secondary | ICD-10-CM | POA: Insufficient documentation

## 2013-01-04 DIAGNOSIS — R5381 Other malaise: Secondary | ICD-10-CM | POA: Insufficient documentation

## 2013-01-04 DIAGNOSIS — Z87448 Personal history of other diseases of urinary system: Secondary | ICD-10-CM | POA: Insufficient documentation

## 2013-01-04 DIAGNOSIS — S8990XA Unspecified injury of unspecified lower leg, initial encounter: Secondary | ICD-10-CM | POA: Insufficient documentation

## 2013-01-04 DIAGNOSIS — R112 Nausea with vomiting, unspecified: Secondary | ICD-10-CM | POA: Insufficient documentation

## 2013-01-04 DIAGNOSIS — R55 Syncope and collapse: Secondary | ICD-10-CM | POA: Insufficient documentation

## 2013-01-04 DIAGNOSIS — Z3202 Encounter for pregnancy test, result negative: Secondary | ICD-10-CM | POA: Insufficient documentation

## 2013-01-04 DIAGNOSIS — R42 Dizziness and giddiness: Secondary | ICD-10-CM | POA: Insufficient documentation

## 2013-01-04 LAB — COMPREHENSIVE METABOLIC PANEL
AST: 15 U/L (ref 0–37)
Alkaline Phosphatase: 54 U/L (ref 39–117)
BUN: 13 mg/dL (ref 6–23)
CO2: 21 mEq/L (ref 19–32)
Chloride: 106 mEq/L (ref 96–112)
Creatinine, Ser: 1 mg/dL (ref 0.50–1.10)
GFR calc non Af Amer: 65 mL/min — ABNORMAL LOW (ref >90)
Potassium: 3.6 mEq/L (ref 3.5–5.1)
Total Bilirubin: 0.4 mg/dL (ref 0.3–1.2)

## 2013-01-04 LAB — CBC WITH DIFFERENTIAL/PLATELET
HCT: 39 % (ref 36.0–46.0)
Hemoglobin: 13.4 g/dL (ref 12.0–15.0)
Lymphocytes Relative: 32 % (ref 12–46)
Monocytes Absolute: 0.4 10*3/uL (ref 0.1–1.0)
Monocytes Relative: 9 % (ref 3–12)
Neutro Abs: 2.4 10*3/uL (ref 1.7–7.7)
Neutrophils Relative %: 53 % (ref 43–77)
RBC: 4.23 MIL/uL (ref 3.87–5.11)
WBC: 4.4 10*3/uL (ref 4.0–10.5)

## 2013-01-04 LAB — URINALYSIS, ROUTINE W REFLEX MICROSCOPIC
Bilirubin Urine: NEGATIVE
Glucose, UA: NEGATIVE mg/dL
Ketones, ur: NEGATIVE mg/dL
Leukocytes, UA: NEGATIVE
Protein, ur: NEGATIVE mg/dL
pH: 7 (ref 5.0–8.0)

## 2013-01-04 LAB — TROPONIN I: Troponin I: <0.3 ng/mL (ref <0.30)

## 2013-01-04 MED ORDER — DEXAMETHASONE SODIUM PHOSPHATE 10 MG/ML IJ SOLN
10.0000 mg | Freq: Once | INTRAMUSCULAR | Status: AC
  Administered 2013-01-04: 10 mg via INTRAVENOUS
  Filled 2013-01-04: qty 1

## 2013-01-04 MED ORDER — SODIUM CHLORIDE 0.9 % IV BOLUS (SEPSIS)
1000.0000 mL | Freq: Once | INTRAVENOUS | Status: AC
  Administered 2013-01-04: 1000 mL via INTRAVENOUS

## 2013-01-04 MED ORDER — FENTANYL CITRATE 0.05 MG/ML IJ SOLN
100.0000 ug | Freq: Once | INTRAMUSCULAR | Status: AC
  Administered 2013-01-04: 100 ug via INTRAVENOUS
  Filled 2013-01-04: qty 2

## 2013-01-04 MED ORDER — DIPHENHYDRAMINE HCL 50 MG/ML IJ SOLN
25.0000 mg | Freq: Once | INTRAMUSCULAR | Status: AC
  Administered 2013-01-04: 25 mg via INTRAVENOUS
  Filled 2013-01-04: qty 1

## 2013-01-04 MED ORDER — SODIUM CHLORIDE 0.9 % IV BOLUS (SEPSIS): 1000.0000 mL | Freq: Once | INTRAVENOUS | Status: DC

## 2013-01-04 MED ORDER — PROMETHAZINE HCL 25 MG/ML IJ SOLN
25.0000 mg | Freq: Once | INTRAMUSCULAR | Status: AC
  Administered 2013-01-04: 25 mg via INTRAVENOUS
  Filled 2013-01-04: qty 1

## 2013-01-04 MED ORDER — METOCLOPRAMIDE HCL 5 MG/ML IJ SOLN
10.0000 mg | Freq: Once | INTRAMUSCULAR | Status: AC
  Administered 2013-01-04: 10 mg via INTRAVENOUS
  Filled 2013-01-04: qty 2

## 2013-01-04 MED ORDER — KETOROLAC TROMETHAMINE 30 MG/ML IJ SOLN
30.0000 mg | Freq: Once | INTRAMUSCULAR | Status: AC
  Administered 2013-01-04: 30 mg via INTRAVENOUS
  Filled 2013-01-04: qty 1

## 2013-01-04 NOTE — ED Notes (Signed)
Patient transported to X-ray 
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
## 2013-01-04 NOTE — ED Notes (Signed)
Pt to room 3 by ems via stretcher. Pt reports having migraine x this am, pt states she had to walk some trails at a park for a class and her group got lost. She started having left knee pain and fell.

## 2013-01-04 NOTE — ED Notes (Signed)
EMS reports patient was ambulating on a trail, developed and aura, had a near syncopal episode, falling to the ground.  She now has a headache that is described as a migraine and left knee pain.

## 2013-01-09 ENCOUNTER — Ambulatory Visit: Payer: Medicaid Other | Admitting: Family Medicine

## 2013-01-16 ENCOUNTER — Encounter: Payer: Self-pay | Admitting: Family Medicine

## 2013-01-16 ENCOUNTER — Ambulatory Visit (INDEPENDENT_AMBULATORY_CARE_PROVIDER_SITE_OTHER): Payer: Medicaid Other | Admitting: Family Medicine

## 2013-01-16 VITALS — BP 154/95 | HR 71 | Ht 65.0 in | Wt 204.9 lb

## 2013-01-16 DIAGNOSIS — G43019 Migraine without aura, intractable, without status migrainosus: Secondary | ICD-10-CM

## 2013-01-16 DIAGNOSIS — M25569 Pain in unspecified knee: Secondary | ICD-10-CM

## 2013-01-16 DIAGNOSIS — M25562 Pain in left knee: Secondary | ICD-10-CM

## 2013-01-16 MED ORDER — TOPIRAMATE 100 MG PO TABS: 100.0000 mg | ORAL_TABLET | Freq: Two times a day (BID) | ORAL | Status: DC

## 2013-01-16 MED ORDER — PROMETHAZINE HCL 25 MG/ML IJ SOLN
25.0000 mg | Freq: Once | INTRAMUSCULAR | Status: AC
  Administered 2013-01-16: 25 mg via INTRAMUSCULAR

## 2013-01-16 MED ORDER — DIPHENHYDRAMINE HCL 50 MG/ML IJ SOLN
25.0000 mg | Freq: Once | INTRAMUSCULAR | Status: AC
  Administered 2013-01-16: 25 mg via INTRAMUSCULAR

## 2013-01-16 MED ORDER — DEXAMETHASONE SODIUM PHOSPHATE 4 MG/ML IJ SOLN: 4.0000 mg | Freq: Once | INTRAMUSCULAR | Status: DC

## 2013-01-16 MED ORDER — DIPHENHYDRAMINE HCL 50 MG/ML IJ SOLN: 50.0000 mg | Freq: Once | INTRAMUSCULAR | Status: DC

## 2013-01-16 MED ORDER — LISINOPRIL-HYDROCHLOROTHIAZIDE 20-12.5 MG PO TABS: 2.0000 | ORAL_TABLET | Freq: Every day | ORAL | Status: DC

## 2013-01-16 MED ORDER — DEXAMETHASONE SODIUM PHOSPHATE 4 MG/ML IJ SOLN
10.0000 mg | Freq: Once | INTRAMUSCULAR | Status: AC
  Administered 2013-01-16: 10 mg via INTRAMUSCULAR
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
## 2013-01-16 NOTE — Patient Instructions (Addendum)
Nice to meet you. We will give you medication in clinic for your headache. Please call the headache clinic to set up an appointment to discuss additional options for treatment of your headache. For your knee please take tylenol for the pain. I have given you a list of exercises to try below and you can ice your knee as needed. If this is not getting better in the next couple of weeks please let us know.  Knee Exercises EXERCISES RANGE OF MOTION(ROM) AND STRETCHING EXERCISES These exercises may help you when beginning to rehabilitate your injury. Your symptoms may resolve with or without further involvement from your physician, physical therapist or athletic trainer. While completing these exercises, remember:   Restoring tissue flexibility helps normal motion to return to the joints. This allows healthier, less painful movement and activity.  An effective stretch should be held for at least 30 seconds.  A stretch should never be painful. You should only feel a gentle lengthening or release in the stretched tissue. STRETCH - Knee Extension, Prone  Lie on your stomach on a firm surface, such as a bed or countertop. Place your right / left knee and leg just beyond the edge of the surface. You may wish to place a towel under the far end of your right / left thigh for comfort.  Relax your leg muscles and allow gravity to straighten your knee. Your clinician may advise you to add an ankle weight if more resistance is helpful for you.  You should feel a stretch in the back of your right / left knee. Hold this position.  * Your physician, physical therapist or athletic trainer may ask you to add ankle weight to enhance your stretch.  RANGE OF MOTION - Knee Flexion, Active  Lie on your back with both knees straight. (If this causes back discomfort, bend your opposite knee, placing your foot flat on the floor.)  Slowly slide your heel back toward your buttocks until you feel a gentle stretch in the  front of your knee or thigh.  Slowly slide your heel back to the starting position.  STRETCH - Quadriceps, Prone   Lie on your stomach on a firm surface, such as a bed or padded floor.  Bend your right / left knee and grasp your ankle. If you are unable to reach, your ankle or pant leg, use a belt around your foot to lengthen your reach.  Gently pull your heel toward your buttocks. Your knee should not slide out to the side. You should feel a stretch in the front of your thigh and/or knee. Hold this position. STRETCH  Hamstrings, Supine   Lie on your back. Loop a belt or towel over the ball of your right / left foot.  Straighten your right / left knee and slowly pull on the belt to raise your leg. Do not allow the right / left knee to bend. Keep your opposite leg flat on the floor. Raise the leg until you feel a gentle stretch behind your right / left knee or thigh. Hold this position. STRENGTHENING EXERCISES These exercises may help you when beginning to rehabilitate your injury. They may resolve your symptoms with or without further involvement from your physician, physical therapist or athletic trainer. While completing these exercises, remember:   Muscles can gain both the endurance and the strength needed for everyday activities through controlled exercises.  Complete these exercises as instructed by your physician, physical therapist or athletic trainer. Progress the resistance and repetitions  only as guided.  You may experience muscle soreness or fatigue, but the pain or discomfort you are trying to eliminate should never worsen during these exercises. If this pain does worsen, stop and make certain you are following the directions exactly. If the pain is still present after adjustments, discontinue the exercise until you can discuss the trouble with your clinician. STRENGTH - Quadriceps, Isometrics  Lie on your back with your right / left leg extended and your opposite knee  bent.  Gradually tense the muscles in the front of your right / left thigh. You should see either your knee cap slide up toward your hip or increased dimpling just above the knee. This motion will push the back of the knee down toward the floor/mat/bed on which you are lying.  Hold the muscle as tight as you can without increasing your pain.  Relax the muscles slowly and completely in between each repetition.  STRENGTH - Quadriceps, Short Arcs   Lie on your back. Place a 4 inch towel roll under your knee so that the knee slightly bends.  Raise only your lower leg by tightening the muscles in the front of your thigh. Do not allow your thigh to rise. Hold this position   Quality counts! Watch for signs that the quadriceps muscle is working to insure you are strengthening the correct muscles and not "cheating" by substituting with healthier muscles.  Lay on your back with your right / left leg extended and your opposite knee bent.  Tense the muscles in the front of your right / left thigh. You should see either your knee cap slide up or increased dimpling just above the knee. Your thigh may even quiver.  Tighten these muscles even more and raise your leg 4 to 6 inches off the floor.   Keeping these muscles tense, lower your leg.  Relax the muscles slowly and completely in between each repetition.  STRENGTH - Hamstring, Curls  Lay on your stomach with your legs extended. (If you lay on a bed, your feet may hang over the edge.)  Tighten the muscles in the back of your thigh to bend your right / left knee up to 90 degrees. Keep your hips flat on the bed/floor.  Hold this position  Slowly lower your leg back to the starting position.   STRENGTH  Quadriceps, Squats  Stand in a door frame so that your feet and knees are in line with the frame.  Use your hands for balance, not support, on the frame.  Slowly lower your weight, bending at the hips and knees. Keep your lower legs  upright so that they are parallel with the door frame. Squat only within the range that does not increase your knee pain. Never let your hips drop below your knees.  Slowly return upright, pushing with your legs, not pulling with your hands.  * Your physician, physical therapist or athletic trainer will alter this angle based on your symptoms and progress. Document Released: 04/21/2005 Document Revised: 08/30/2011 Document Reviewed: 09/19/2008 [facility] Patient Information 2014 [place], [place].

## 2013-01-16 NOTE — Assessment & Plan Note (Addendum)
Following injury to knee. Previously with XR with no acute changes. May have meniscal tear. Will give trial of conservative therapy with tylenol (no NSAIDs given anaphylaxis with ASA), ice, and leg strengthening exercises. May need PT in the future.

## 2013-01-16 NOTE — Progress Notes (Signed)
  Subjective:    Patient ID: Kirsten Walsh, female    DOB: 09/09/1960, 52 y.o.   MRN: [id number]  HPI Patient is a 52 yo female who presents for f/u of migraines and knee pain.  Migraines: described as right side of her head down in to the back of her neck. Throbbing. Has aura. Some nausea. Light bothers her, some sounds bother her. Denies fever, chills, weakness, numbness, vomiting. 9/10 pain. Has been on zonegran, metoprolol, imitrex, topamax, botox. Has seen the headache clinic previously.  Left knee: about a month ago was running stadiums when twisted ankle. Saw urgent care and said to f/u with [place]. Has not been getting better. States has been popping occasionally, no catching. Will get pain shooting down the back of her leg on occasion. Had XR in ED 2 weeks ago that showed no acute changes.    Review of Systems see HPI     Objective:   Physical Exam  Constitutional: She appears well-developed and well-nourished.  HENT:  Head: Normocephalic and atraumatic.  Eyes: Conjunctivae are normal. Pupils are equal, round, and reactive to light.  Musculoskeletal:  Left knee with minimal effusion inferior to the patella, no ligament laxity on exam, no pain or click with mcmurrays, though after completing the test the patient extended her knee and a click was heard Mild spasm in right upper back  Neurological:  5/5 strength throughout, sensation to light touch intact, patellar reflex 2+, CN in tact  BP 154/95  Pulse 71  Ht 5\' 5"  (1.651 m)  Wt 204 lb 14.4 oz (92.942 kg)  BMI 34.1 kg/m2    Assessment & Plan:

## 2013-01-31 ENCOUNTER — Encounter (HOSPITAL_BASED_OUTPATIENT_CLINIC_OR_DEPARTMENT_OTHER): Payer: Self-pay | Admitting: *Deleted

## 2013-01-31 ENCOUNTER — Emergency Department (HOSPITAL_BASED_OUTPATIENT_CLINIC_OR_DEPARTMENT_OTHER)
Admission: EM | Admit: 2013-01-31 | Discharge: 2013-01-31 | Disposition: A | Discharge status: Home / Self Care | Payer: Medicaid Other | Attending: Emergency Medicine | Admitting: Emergency Medicine

## 2013-01-31 DIAGNOSIS — Z8679 Personal history of other diseases of the circulatory system: Secondary | ICD-10-CM | POA: Insufficient documentation

## 2013-01-31 DIAGNOSIS — I10 Essential (primary) hypertension: Secondary | ICD-10-CM | POA: Insufficient documentation

## 2013-01-31 DIAGNOSIS — Z79899 Other long term (current) drug therapy: Secondary | ICD-10-CM | POA: Insufficient documentation

## 2013-01-31 DIAGNOSIS — Z8719 Personal history of other diseases of the digestive system: Secondary | ICD-10-CM | POA: Insufficient documentation

## 2013-01-31 DIAGNOSIS — Z87448 Personal history of other diseases of urinary system: Secondary | ICD-10-CM | POA: Insufficient documentation

## 2013-01-31 DIAGNOSIS — H53149 Visual discomfort, unspecified: Secondary | ICD-10-CM | POA: Insufficient documentation

## 2013-01-31 DIAGNOSIS — R51 Headache: Secondary | ICD-10-CM | POA: Insufficient documentation

## 2013-01-31 DIAGNOSIS — R112 Nausea with vomiting, unspecified: Secondary | ICD-10-CM | POA: Insufficient documentation

## 2013-01-31 MED ORDER — DEXAMETHASONE SODIUM PHOSPHATE 10 MG/ML IJ SOLN
10.0000 mg | Freq: Once | INTRAMUSCULAR | Status: AC
  Administered 2013-01-31: 10 mg via INTRAVENOUS
  Filled 2013-01-31: qty 1

## 2013-01-31 MED ORDER — HYDROMORPHONE HCL PF 1 MG/ML IJ SOLN
1.0000 mg | Freq: Once | INTRAMUSCULAR | Status: AC
  Administered 2013-01-31: 1 mg via INTRAVENOUS
  Filled 2013-01-31: qty 1

## 2013-01-31 MED ORDER — PROMETHAZINE HCL 25 MG/ML IJ SOLN
25.0000 mg | Freq: Once | INTRAMUSCULAR | Status: AC
  Administered 2013-01-31: 25 mg via INTRAVENOUS
  Filled 2013-01-31: qty 1

## 2013-01-31 MED ORDER — SUMATRIPTAN SUCCINATE 6 MG/0.5ML ~~LOC~~ SOLN
6.0000 mg | Freq: Once | SUBCUTANEOUS | Status: AC
  Administered 2013-01-31: 6 mg via SUBCUTANEOUS
  Filled 2013-01-31: qty 0.5

## 2013-01-31 MED ORDER — SODIUM CHLORIDE 0.9 % IV BOLUS (SEPSIS)
1000.0000 mL | Freq: Once | INTRAVENOUS | Status: AC
  Administered 2013-01-31: 1000 mL via INTRAVENOUS

## 2013-01-31 MED ORDER — METOCLOPRAMIDE HCL 5 MG/ML IJ SOLN
10.0000 mg | Freq: Once | INTRAMUSCULAR | Status: AC
  Administered 2013-01-31: 10 mg via INTRAVENOUS
  Filled 2013-01-31: qty 2

## 2013-01-31 MED ORDER — DIPHENHYDRAMINE HCL 50 MG/ML IJ SOLN
25.0000 mg | Freq: Once | INTRAMUSCULAR | Status: AC
  Administered 2013-01-31: 25 mg via INTRAVENOUS
  Filled 2013-01-31: qty 1

## 2013-01-31 MED ORDER — KETOROLAC TROMETHAMINE 30 MG/ML IJ SOLN
30.0000 mg | Freq: Once | INTRAMUSCULAR | Status: AC
  Administered 2013-01-31: 30 mg via INTRAVENOUS
  Filled 2013-01-31: qty 1

## 2013-01-31 NOTE — ED Provider Notes (Signed)
CSN: [id number]     Arrival date & time 01/31/13  1320 History     First MD Initiated Contact with Patient 01/31/13 1321     Chief Complaint  Patient presents with  . Migraine   (Consider location/radiation/quality/duration/timing/severity/associated sxs/prior Treatment) Patient is a 52 y.o. female presenting with migraines. The history is provided by the patient.  Migraine This is a chronic problem. Episode onset: worse over the last 3 days. The problem occurs constantly. The problem has been gradually worsening. Associated symptoms include headaches. Pertinent negatives include no shortness of breath. Associated symptoms comments: No numbness, weakness, visual changes, fever. Exacerbated by: Bright light and loud noises. Nothing relieves the symptoms. She has tried acetaminophen (Sumatriptan, Topamax, pain medicine) for the symptoms. The treatment provided no relief.    Past Medical History  Diagnosis Date  . Migraine   . Hypertension   . Migraine   . Chest pain   . Gall stones   . Renal disorder    Past Surgical History  Procedure Laterality Date  . Cholecystectomy    . Kidney stone surgery     Family History  Problem Relation Age of Onset  . Cancer Mother   . Cirrhosis Father    History  Substance Use Topics  . Smoking status: Never Smoker   . Smokeless tobacco: Never Used  . Alcohol Use: Yes     Comment: occassionally   OB History   Grav Para Term Preterm Abortions TAB SAB Ect Mult Living                 Review of Systems  Constitutional: Negative for fever.  Eyes: Positive for photophobia. Negative for visual disturbance.  Respiratory: Negative for shortness of breath.   Gastrointestinal: Positive for nausea and vomiting.  Neurological: Positive for headaches. Negative for weakness and numbness.  All other systems reviewed and are negative.    Allergies  Apple; Aspirin; and Carrot  Home Medications   Current Outpatient Rx  Name  Route  Sig   Dispense  Refill  . cloNIDine (CATAPRES - DOSED IN MG/24 HR) 0.1 mg/24hr patch   Transdermal   Place 1 patch onto the skin once a week. Applied on Mondays.         Marland Kitchen EPINEPHrine (EPIPEN) 0.3 mg/0.3 mL DEVI   Intramuscular   Inject 0.3 mg into the muscle once as needed. For allergic reaction         . lisinopril-hydrochlorothiazide (PRINZIDE,ZESTORETIC) 20-12.5 MG per tablet   Oral   Take 2 tablets by mouth daily.   60 tablet   2   . metoprolol tartrate (LOPRESSOR) 25 MG tablet   Oral   Take 12.5 mg by mouth 2 (two) times daily.         . ondansetron (ZOFRAN ODT) 4 MG disintegrating tablet   Oral   Take 1 tablet (4 mg total) by mouth every 6 (six) hours as needed for nausea.   30 tablet   1   . EXPIRED: pregabalin (LYRICA) 50 MG capsule   Oral   Take 1 capsule (50 mg total) by mouth 3 (three) times daily.   90 capsule   2   . SUMAtriptan (IMITREX) 50 MG tablet   Oral   Take 1 tablet (50 mg total) by mouth every 2 (two) hours as needed for migraine. Do not exceed 200 mg in 24 hours.   20 tablet   0   . topiramate (TOPAMAX) 100 MG tablet   Oral  Take 1 tablet (100 mg total) by mouth 2 (two) times daily.   60 tablet   1    BP 145/79  Pulse 66  Temp(Src) 98.7 F (37.1 C) (Oral)  Resp 16  SpO2 100% Physical Exam  Nursing note and vitals reviewed. Constitutional: She is oriented to person, place, and time. She appears well-developed and well-nourished. She appears distressed.  HENT:  Head: Normocephalic and atraumatic.  Eyes: EOM are normal. Pupils are equal, round, and reactive to light.  Fundoscopic exam:      The right eye shows no papilledema.       The left eye shows no papilledema.  Neck: Normal range of motion. Neck supple.  Cardiovascular: Normal rate, regular rhythm, normal heart sounds and intact distal pulses.  Exam reveals no friction rub.   No murmur heard. Pulmonary/Chest: Effort normal and breath sounds normal. She has no wheezes. She has  no rales.  Abdominal: Soft. Bowel sounds are normal. She exhibits no distension. There is no tenderness. There is no rebound and no guarding.  Musculoskeletal: Normal range of motion. She exhibits no tenderness.  No edema  Lymphadenopathy:    She has no cervical adenopathy.  Neurological: She is alert and oriented to person, place, and time. She has normal strength. No cranial nerve deficit or sensory deficit. Gait normal.  photophobia  Skin: Skin is warm and dry. No rash noted.  Psychiatric: She has a normal mood and affect. Her behavior is normal.    ED Course   Procedures (including critical care time)  Labs Reviewed - No data to display No results found. No diagnosis found.  MDM   Pt with typical migraine HA without sx suggestive of SAH(sudden onset, worst of life, or deficits), infection, or cavernous vein thrombosis.  Normal neuro exam and vital signs. Will give HA cocktail and will re-eval.  2:58 PM Pt still having signficant pain and nausea  Gwyneth Sprout, MD 01/31/13 1458

## 2013-01-31 NOTE — ED Provider Notes (Signed)
Care assumed from Dr. Anitra Lauth. Typical migraine headache without red flag features.   Headache improved after medications and patient requesting discharge. 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   [name], MD 01/31/13 [phone]

## 2013-01-31 NOTE — ED Notes (Signed)
Pt to room 12 by ems, amb with slow, steady gait. Pt reports 3 days of her usual migraine sx with photophobia, head pain, and nausea.

## 2013-02-13 ENCOUNTER — Inpatient Hospital Stay (HOSPITAL_BASED_OUTPATIENT_CLINIC_OR_DEPARTMENT_OTHER)
Admission: EM | Admit: 2013-02-13 | Discharge: 2013-02-20 | DRG: 103 | Disposition: A | Discharge status: Home / Self Care | Payer: Medicaid Other | Attending: Family Medicine | Admitting: Family Medicine

## 2013-02-13 ENCOUNTER — Encounter (HOSPITAL_BASED_OUTPATIENT_CLINIC_OR_DEPARTMENT_OTHER): Payer: Self-pay | Admitting: Emergency Medicine

## 2013-02-13 DIAGNOSIS — E669 Obesity, unspecified: Secondary | ICD-10-CM | POA: Diagnosis present

## 2013-02-13 DIAGNOSIS — Z886 Allergy status to analgesic agent status: Secondary | ICD-10-CM

## 2013-02-13 DIAGNOSIS — G43911 Migraine, unspecified, intractable, with status migrainosus: Principal | ICD-10-CM | POA: Diagnosis present

## 2013-02-13 DIAGNOSIS — N179 Acute kidney failure, unspecified: Secondary | ICD-10-CM | POA: Diagnosis not present

## 2013-02-13 DIAGNOSIS — A599 Trichomoniasis, unspecified: Secondary | ICD-10-CM | POA: Diagnosis present

## 2013-02-13 DIAGNOSIS — H53149 Visual discomfort, unspecified: Secondary | ICD-10-CM | POA: Diagnosis present

## 2013-02-13 DIAGNOSIS — G43901 Migraine, unspecified, not intractable, with status migrainosus: Secondary | ICD-10-CM | POA: Diagnosis present

## 2013-02-13 DIAGNOSIS — I10 Essential (primary) hypertension: Secondary | ICD-10-CM | POA: Diagnosis present

## 2013-02-13 DIAGNOSIS — M79609 Pain in unspecified limb: Secondary | ICD-10-CM

## 2013-02-13 DIAGNOSIS — Z9089 Acquired absence of other organs: Secondary | ICD-10-CM

## 2013-02-13 DIAGNOSIS — M542 Cervicalgia: Secondary | ICD-10-CM | POA: Diagnosis present

## 2013-02-13 DIAGNOSIS — M25562 Pain in left knee: Secondary | ICD-10-CM

## 2013-02-13 DIAGNOSIS — G43909 Migraine, unspecified, not intractable, without status migrainosus: Secondary | ICD-10-CM | POA: Diagnosis present

## 2013-02-13 DIAGNOSIS — G43709 Chronic migraine without aura, not intractable, without status migrainosus: Secondary | ICD-10-CM | POA: Diagnosis present

## 2013-02-13 DIAGNOSIS — R42 Dizziness and giddiness: Secondary | ICD-10-CM

## 2013-02-13 DIAGNOSIS — G8929 Other chronic pain: Secondary | ICD-10-CM

## 2013-02-13 DIAGNOSIS — I498 Other specified cardiac arrhythmias: Secondary | ICD-10-CM | POA: Diagnosis not present

## 2013-02-13 DIAGNOSIS — I959 Hypotension, unspecified: Secondary | ICD-10-CM | POA: Diagnosis not present

## 2013-02-13 DIAGNOSIS — R001 Bradycardia, unspecified: Secondary | ICD-10-CM | POA: Clinically undetermined

## 2013-02-13 DIAGNOSIS — M7989 Other specified soft tissue disorders: Secondary | ICD-10-CM | POA: Diagnosis not present

## 2013-02-13 DIAGNOSIS — Z91018 Allergy to other foods: Secondary | ICD-10-CM

## 2013-02-13 DIAGNOSIS — B9689 Other specified bacterial agents as the cause of diseases classified elsewhere: Secondary | ICD-10-CM

## 2013-02-13 DIAGNOSIS — A5901 Trichomonal vulvovaginitis: Secondary | ICD-10-CM

## 2013-02-13 DIAGNOSIS — G43019 Migraine without aura, intractable, without status migrainosus: Secondary | ICD-10-CM

## 2013-02-13 HISTORY — DX: Low back pain: M54.5

## 2013-02-13 HISTORY — DX: Low back pain, unspecified: M54.50

## 2013-02-13 HISTORY — DX: Calculus of kidney: N20.0

## 2013-02-13 HISTORY — DX: Other chronic pain: G89.29

## 2013-02-13 MED ORDER — MAGNESIUM SULFATE 50 % IJ SOLN: 2.0000 g | Freq: Once | INTRAVENOUS | Status: DC

## 2013-02-13 MED ORDER — ACETAMINOPHEN 325 MG PO TABS
650.0000 mg | ORAL_TABLET | Freq: Four times a day (QID) | ORAL | Status: DC | PRN
  Administered 2013-02-17 – 2013-02-18 (×2): 650 mg via ORAL
  Filled 2013-02-13 (×2): qty 2

## 2013-02-13 MED ORDER — TOPIRAMATE 100 MG PO TABS
100.0000 mg | ORAL_TABLET | Freq: Two times a day (BID) | ORAL | Status: DC
  Administered 2013-02-14 – 2013-02-20 (×14): 100 mg via ORAL
  Filled 2013-02-13 (×15): qty 1

## 2013-02-13 MED ORDER — HEPARIN SODIUM (PORCINE) 5000 UNIT/ML IJ SOLN
5000.0000 [IU] | Freq: Three times a day (TID) | INTRAMUSCULAR | Status: DC
  Administered 2013-02-14 – 2013-02-20 (×19): 5000 [IU] via SUBCUTANEOUS
  Filled 2013-02-13 (×26): qty 1

## 2013-02-13 MED ORDER — METOCLOPRAMIDE HCL 5 MG/ML IJ SOLN
10.0000 mg | Freq: Once | INTRAMUSCULAR | Status: AC
  Administered 2013-02-14: 10 mg via INTRAVENOUS
  Filled 2013-02-13: qty 2

## 2013-02-13 MED ORDER — HYDROMORPHONE HCL PF 1 MG/ML IJ SOLN
1.0000 mg | Freq: Once | INTRAMUSCULAR | Status: AC
  Administered 2013-02-13: 1 mg via INTRAVENOUS
  Filled 2013-02-13: qty 1

## 2013-02-13 MED ORDER — ACETAMINOPHEN 650 MG RE SUPP: 650.0000 mg | Freq: Four times a day (QID) | RECTAL | Status: DC | PRN

## 2013-02-13 MED ORDER — ONDANSETRON HCL 4 MG PO TABS
4.0000 mg | ORAL_TABLET | Freq: Four times a day (QID) | ORAL | Status: DC | PRN
  Administered 2013-02-16: 4 mg via ORAL
  Filled 2013-02-13: qty 1

## 2013-02-13 MED ORDER — DIPHENHYDRAMINE HCL 50 MG/ML IJ SOLN
25.0000 mg | Freq: Once | INTRAMUSCULAR | Status: AC
  Administered 2013-02-13: 25 mg via INTRAVENOUS
  Filled 2013-02-13: qty 1

## 2013-02-13 MED ORDER — METOCLOPRAMIDE HCL 5 MG/ML IJ SOLN
10.0000 mg | Freq: Once | INTRAMUSCULAR | Status: AC
  Administered 2013-02-13: 10 mg via INTRAVENOUS
  Filled 2013-02-13: qty 2

## 2013-02-13 MED ORDER — PROCHLORPERAZINE EDISYLATE 5 MG/ML IJ SOLN
10.0000 mg | Freq: Once | INTRAMUSCULAR | Status: DC
  Filled 2013-02-13: qty 2

## 2013-02-13 MED ORDER — LISINOPRIL-HYDROCHLOROTHIAZIDE 20-12.5 MG PO TABS: 2.0000 | ORAL_TABLET | Freq: Every day | ORAL | Status: DC

## 2013-02-13 MED ORDER — SODIUM CHLORIDE 0.9 % IV BOLUS (SEPSIS)
1000.0000 mL | Freq: Once | INTRAVENOUS | Status: AC
  Administered 2013-02-13: 1000 mL via INTRAVENOUS

## 2013-02-13 MED ORDER — DIHYDROERGOTAMINE MESYLATE 1 MG/ML IJ SOLN
0.5000 mg | Freq: Once | INTRAMUSCULAR | Status: AC
  Administered 2013-02-14: 0.5 mg via INTRAVENOUS
  Filled 2013-02-13: qty 0.5

## 2013-02-13 MED ORDER — MORPHINE SULFATE 4 MG/ML IJ SOLN
4.0000 mg | INTRAMUSCULAR | Status: DC | PRN
  Administered 2013-02-15 – 2013-02-16 (×3): 4 mg via INTRAVENOUS
  Filled 2013-02-13 (×4): qty 1

## 2013-02-13 MED ORDER — PROMETHAZINE HCL 25 MG/ML IJ SOLN
25.0000 mg | Freq: Once | INTRAMUSCULAR | Status: AC
  Administered 2013-02-14: 25 mg via INTRAVENOUS
  Filled 2013-02-13: qty 1

## 2013-02-13 MED ORDER — LISINOPRIL 20 MG PO TABS
20.0000 mg | ORAL_TABLET | Freq: Every day | ORAL | Status: DC
  Administered 2013-02-14 – 2013-02-18 (×5): 20 mg via ORAL
  Filled 2013-02-13 (×7): qty 1

## 2013-02-13 MED ORDER — DEXAMETHASONE SODIUM PHOSPHATE 10 MG/ML IJ SOLN
10.0000 mg | Freq: Once | INTRAMUSCULAR | Status: AC
  Administered 2013-02-14: 10 mg via INTRAVENOUS
  Filled 2013-02-13: qty 1

## 2013-02-13 MED ORDER — SODIUM CHLORIDE 0.9 % IV SOLN
INTRAVENOUS | Status: DC
  Administered 2013-02-14 – 2013-02-15 (×4): via INTRAVENOUS

## 2013-02-13 MED ORDER — VALPROATE SODIUM 500 MG/5ML IV SOLN
500.0000 mg | Freq: Once | INTRAVENOUS | Status: AC
  Administered 2013-02-13: 500 mg via INTRAVENOUS
  Filled 2013-02-13: qty 5

## 2013-02-13 MED ORDER — ONDANSETRON HCL 4 MG/2ML IJ SOLN
4.0000 mg | Freq: Four times a day (QID) | INTRAMUSCULAR | Status: DC | PRN
  Administered 2013-02-15: 4 mg via INTRAVENOUS
  Filled 2013-02-13: qty 2

## 2013-02-13 MED ORDER — HYDROCHLOROTHIAZIDE 12.5 MG PO CAPS
12.5000 mg | ORAL_CAPSULE | Freq: Every day | ORAL | Status: DC
  Administered 2013-02-14 – 2013-02-19 (×6): 12.5 mg via ORAL
  Filled 2013-02-13 (×7): qty 1

## 2013-02-13 NOTE — ED Notes (Signed)
Pt's daughter at [facility] at this time-requested note to take to work to read she was here with pt-written note given
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
## 2013-02-13 NOTE — ED Provider Notes (Signed)
CSN: [id number]     Arrival date & time 02/13/13  1516 History   First MD Initiated Contact with Patient 02/13/13 1538     Chief Complaint  Patient presents with  . Migraine   (Consider location/radiation/quality/duration/timing/severity/associated sxs/prior Treatment) Patient is a 52 y.o. female presenting with migraines. The history is provided by the patient.  Migraine This is a chronic problem. The current episode started yesterday. The problem occurs constantly. The problem has not changed since onset.Associated symptoms include headaches. Pertinent negatives include no chest pain, no abdominal pain and no shortness of breath. Nothing aggravates the symptoms. Nothing relieves the symptoms. Treatments tried: topamax, imitrex.    Past Medical History  Diagnosis Date  . Migraine   . Hypertension   . Migraine   . Chest pain   . Gall stones   . Renal disorder    Past Surgical History  Procedure Laterality Date  . Cholecystectomy    . Kidney stone surgery    . Cholecystectomy, laparoscopic     Family History  Problem Relation Age of Onset  . Cancer Mother   . Cirrhosis Father    History  Substance Use Topics  . Smoking status: Never Smoker   . Smokeless tobacco: Never Used  . Alcohol Use: Yes     Comment: occassionally   OB History   Grav Para Term Preterm Abortions TAB SAB Ect Mult Living                 Review of Systems  Constitutional: Negative for fever and chills.  HENT: Negative for neck pain and neck stiffness.   Respiratory: Negative for shortness of breath.   Cardiovascular: Negative for chest pain.  Gastrointestinal: Positive for nausea and vomiting. Negative for abdominal pain.  Genitourinary: Negative for dysuria.  Neurological: Positive for headaches. Negative for weakness (no new weakness, has chronic mild weakness of RUE/RLE) and numbness.  All other systems reviewed and are negative.    Allergies  Apple; Aspirin; and Carrot  Home Medications    Current Outpatient Rx  Name  Route  Sig  Dispense  Refill  . cloNIDine (CATAPRES - DOSED IN MG/24 HR) 0.1 mg/24hr patch   Transdermal   Place 1 patch onto the skin once a week. Applied on Mondays.         Marland Kitchen EPINEPHrine (EPIPEN) 0.3 mg/0.3 mL DEVI   Intramuscular   Inject 0.3 mg into the muscle once as needed. For allergic reaction         . lisinopril-hydrochlorothiazide (PRINZIDE,ZESTORETIC) 20-12.5 MG per tablet   Oral   Take 2 tablets by mouth daily.   60 tablet   2   . metoprolol tartrate (LOPRESSOR) 25 MG tablet   Oral   Take 12.5 mg by mouth 2 (two) times daily.         . ondansetron (ZOFRAN ODT) 4 MG disintegrating tablet   Oral   Take 1 tablet (4 mg total) by mouth every 6 (six) hours as needed for nausea.   30 tablet   1   . EXPIRED: pregabalin (LYRICA) 50 MG capsule   Oral   Take 1 capsule (50 mg total) by mouth 3 (three) times daily.   90 capsule   2   . SUMAtriptan (IMITREX) 50 MG tablet   Oral   Take 1 tablet (50 mg total) by mouth every 2 (two) hours as needed for migraine. Do not exceed 200 mg in 24 hours.   20 tablet   0   .  topiramate (TOPAMAX) 100 MG tablet   Oral   Take 1 tablet (100 mg total) by mouth 2 (two) times daily.   60 tablet   1    BP 143/85  Pulse 79  Resp 15  SpO2 100% Physical Exam  Vitals reviewed. Constitutional: She is oriented to person, place, and time. She appears well-developed and well-nourished.  HENT:  Head: Normocephalic and atraumatic.  Right Ear: External ear normal.  Left Ear: External ear normal.  Nose: Nose normal.  Eyes: EOM are normal. Pupils are equal, round, and reactive to light. Right eye exhibits no discharge. Left eye exhibits no discharge.  Neck: Normal range of motion. Neck supple.  Cardiovascular: Normal rate, regular rhythm and normal heart sounds.   Pulmonary/Chest: Effort normal and breath sounds normal.  Abdominal: Soft. There is no tenderness.  Neurological: She is alert and  oriented to person, place, and time. No cranial nerve deficit or sensory deficit. Coordination normal. GCS eye subscore is 4. GCS verbal subscore is 5. GCS motor subscore is 6.  Mild decreased strength in RUE/RLE that are c/w prior exams  Skin: Skin is warm and dry.    ED Course  Procedures (including critical care time) Labs Review Labs Reviewed - No data to display Imaging Review No results found.  MDM   1. Migraine with status migrainosus    52 year old female with history of chronic migraines. She states that she has a migraine every day, but since last night his been worse. She's been seen ED numerous times for the same. There are no red flags to her presentation such as sudden onset, change in headache, or focal neurologic signs. No fevers. Given a headache cocktail, fluids, and decadron with minimal success. Given dilaudid w/o much change. Patient still in intense pain, c/w intractable migraine. D/w family medicine, will admit to [facility] for further pain control. Given her long history I do not feel that imaging is needed at this time.    Audree Camel, MD 02/14/13 [phone]

## 2013-02-13 NOTE — ED Notes (Signed)
[facility] report-migraine HA x 2 weeks-n/v started today-picked up from [facility]-IV was started-zofran and fentanyl given IV PTA

## 2013-02-13 NOTE — ED Notes (Signed)
Pt has had a headache for 2 weeks, N/V started last night 02/12/13.  Pt was at [facility] when her instructor called EMS due to patient becoming light headed and dry heaving.

## 2013-02-14 ENCOUNTER — Encounter (HOSPITAL_COMMUNITY): Payer: Self-pay | Admitting: General Practice

## 2013-02-14 DIAGNOSIS — G43901 Migraine, unspecified, not intractable, with status migrainosus: Secondary | ICD-10-CM | POA: Diagnosis present

## 2013-02-14 LAB — CBC
HCT: 39.8 % (ref 36.0–46.0)
Hemoglobin: 13.8 g/dL (ref 12.0–15.0)
MCV: 91.7 fL (ref 78.0–100.0)
RBC: 4.34 MIL/uL (ref 3.87–5.11)
RDW: 13.5 % (ref 11.5–15.5)
WBC: 4.9 10*3/uL (ref 4.0–10.5)

## 2013-02-14 LAB — WET PREP, GENITAL

## 2013-02-14 LAB — BASIC METABOLIC PANEL
CO2: 19 mEq/L (ref 19–32)
Chloride: 108 mEq/L (ref 96–112)
Creatinine, Ser: 0.94 mg/dL (ref 0.50–1.10)
GFR calc Af Amer: 81 mL/min — ABNORMAL LOW (ref >90)
Potassium: 3.2 mEq/L — ABNORMAL LOW (ref 3.5–5.1)

## 2013-02-14 MED ORDER — DEXAMETHASONE SODIUM PHOSPHATE 10 MG/ML IJ SOLN: 10.0000 mg | Freq: Three times a day (TID) | INTRAMUSCULAR | Status: DC

## 2013-02-14 MED ORDER — DIHYDROERGOTAMINE MESYLATE 1 MG/ML IJ SOLN: 1.0000 mg | Freq: Three times a day (TID) | INTRAMUSCULAR | Status: DC

## 2013-02-14 MED ORDER — METOCLOPRAMIDE HCL 5 MG/ML IJ SOLN
10.0000 mg | Freq: Three times a day (TID) | INTRAMUSCULAR | Status: AC
  Administered 2013-02-14 – 2013-02-16 (×8): 10 mg via INTRAVENOUS
  Filled 2013-02-14 (×10): qty 2

## 2013-02-14 MED ORDER — POTASSIUM CHLORIDE CRYS ER 20 MEQ PO TBCR
40.0000 meq | EXTENDED_RELEASE_TABLET | Freq: Once | ORAL | Status: AC
  Administered 2013-02-14: 40 meq via ORAL
  Filled 2013-02-14: qty 2

## 2013-02-14 MED ORDER — DIHYDROERGOTAMINE MESYLATE 1 MG/ML IJ SOLN
1.0000 mg | INTRAMUSCULAR | Status: AC
  Administered 2013-02-14 – 2013-02-16 (×8): 1 mg via INTRAVENOUS
  Filled 2013-02-14 (×11): qty 1

## 2013-02-14 MED ORDER — CLONIDINE HCL 0.1 MG/24HR TD PTWK
0.1000 mg | MEDICATED_PATCH | TRANSDERMAL | Status: DC
  Administered 2013-02-14 – 2013-02-17 (×2): 0.1 mg via TRANSDERMAL
  Filled 2013-02-14 (×3): qty 1

## 2013-02-14 MED ORDER — DEXAMETHASONE SODIUM PHOSPHATE 10 MG/ML IJ SOLN
4.0000 mg | Freq: Three times a day (TID) | INTRAMUSCULAR | Status: AC
  Administered 2013-02-14 – 2013-02-16 (×8): 4 mg via INTRAVENOUS
  Filled 2013-02-14 (×2): qty 1
  Filled 2013-02-14 (×3): qty 0.4
  Filled 2013-02-14: qty 1
  Filled 2013-02-14: qty 0.4
  Filled 2013-02-14: qty 1
  Filled 2013-02-14 (×2): qty 0.4

## 2013-02-14 MED ORDER — METOCLOPRAMIDE HCL 5 MG/ML IJ SOLN: 10.0000 mg | Freq: Three times a day (TID) | INTRAMUSCULAR | Status: DC

## 2013-02-14 MED ORDER — TRAZODONE HCL 50 MG PO TABS
25.0000 mg | ORAL_TABLET | Freq: Every evening | ORAL | Status: DC | PRN
  Administered 2013-02-17 – 2013-02-18 (×2): 25 mg via ORAL
  Filled 2013-02-14 (×2): qty 1
  Filled 2013-02-14: qty 2

## 2013-02-14 NOTE — Progress Notes (Signed)
FMTS Attending Daily Note:  Jeff Walden MD  [phone] pager  Family Practice pager:  [phone] I have discussed this patient with the resident Dr. Wight and attending physician Dr. McDiarmid.  I agree with their findings, assessment, and care plan  

## 2013-02-14 NOTE — Progress Notes (Signed)
Chaplain Note: Chaplain visited with pt and pt's family.  Pt was resting in bed, awake, alert, and fully oriented.  Pt's family was at bedside in support of pt.  Chaplain provided spiritual comfort, support and prayer for pt and family.  Pt expressed appreciation for chaplain support.  Chaplain will follow up as necessary.  02/14/13 1600  Clinical Encounter Type  Visited With Patient and family together  Visit Type Spiritual support  Referral From Physician  Spiritual Encounters  Spiritual Needs Prayer;Emotional  Stress Factors  Patient Stress Factors Family relationships;Health changes;Major life changes  Family Stress Factors Family relationships  Verdie Shire, [place] [phone]

## 2013-02-14 NOTE — H&P (Signed)
I have seen and examined this patient. I have discussed with Dr Casper Harrison.  I agree with their findings and plans as documented in their admission note.   Acute Issues Status Migrainosus - Recurrent problem for Ms Milbrath.  Pt reports this as stereotypical exacerbation for her. - She has had a partial response to DHE-45 in ED this morning Recommendation - Continue with DHE-45 protocol to which patient has responded well in the past.          CYCLE         Reglan 10 mg IV every 8 hours, admin 30 minutes prior to DHE-45 dose         DHE-45 1 mg IV every 8 hours         Decadron 4 mg or Solumedrol 100 mg every 8 hours    Repeat Cycle up to 8 times after initial dose.

## 2013-02-14 NOTE — Progress Notes (Signed)
Family Medicine Teaching Service Daily Progress Note Intern Pager: [phone]  Patient name: Kirsten Walsh Medical record number: [id number] Date of birth: 23-Nov-1960 Age: 52 y.o. Gender: female  Primary Care Provider: [name], MD Consultants: none Code Status: Full  Pt Overview and Major Events to Date:   Assessment and Plan: Kirsten Walsh is a 52 y.o. year old female presenting with intractable migraine headache for 4 days. Pt has a long-standing history of difficult-to-control migraine, and PMH otherwise significant for chronic body pain in various places, hypertension.   # Status migrainosus - long-standing hx of migraine, unrelieved at home with Triptans, etc  -unrelieved at outside ED with Dilaudid, Benadryl, and Reglan IV  -DDx could also include temporal arteritis given tenderness over right temple, though pt is young for this  -possible component of muscle pain/neck pain/back pain (pt with numerous pain complaints, per chart review)  -ordered for DHE 0.5 mg once, with Reglan 10 mg 15 minutes prior  -also ordered for Phenergan 25 mg IV once with Decadron 10 mg IV once  [ ]  f/u response to DHE and consider repeat dosing 0.2-1.0 mg with Reglan q8 (max 2 mg/day)  [ ]  could consider neurology consult vs referral outpt  - Labs wnl except for: K+ 3.2. CBC wnl. - Replete K with po. - [ ]  f/u symptomatic improvement on DHE protocol  # Hypertension - BP elevated intermittently to 150's, mostly with "waves of pain"  -continue home lisinopril-HCTZ  -monitor BP with other vitals per routine  -of note, pt has 0.1 mg/24h clonidine patches at home q-week; will not order, for now  -will need clarification of dose/rationale for clonidine as an outpt (uncertain if for BP or for migraine)   FEN/GI: regular diet, NS at 100 mL/h  Prophylaxis: subQ heparin   Disposition: discharge home pending symptomatic improvement  Subjective:  Patient says she is doing okay today,  still has the migraine that she rates as 7/10, down from 10/10 before she went to [employer]. Pain located across front of head, radiates toward back of head on right and down to right neck/shoulder. She has been dry heaving less, no actual emesis. Still has photophobia and sees some silver spots throughout her vision, but overall improved from yesterday. Has nausea and tried to eat some breakfast this AM, took a few bites and doesn't think she'll keep it down.   Objective: Temp:  [97.3 F (36.3 C)-97.9 F (36.6 C)] 97.3 F (36.3 C) (08/27 0541) Pulse Rate:  [63-79] 64 (08/27 0541) Resp:  [15-20] 20 (08/27 0541) BP: (125-153)/(72-108) 133/72 mmHg (08/27 0541) SpO2:  [96 %-100 %] 100 % (08/27 0541) Weight:  [198 lb (89.812 kg)] 198 lb (89.812 kg) (08/26 2309) Physical Exam: General: NAD, sitting in bed eating breakfast HEENT: NCAT, colored contacts limiting exam but appeared PERRL, EOMI. Right neck tender but normal ROM. Cardiovascular: RRR, normal s1/s2, no m/r/g Respiratory: CTAB, effort normal Abdomen: soft, nontender, bowel sounds present Extremities: no edema.  Laboratory:  Recent Labs Lab 02/14/13 0120  WBC 4.9  HGB 13.8  HCT 39.8  PLT 203    Recent Labs Lab 02/14/13 0120  NA 138  K 3.2*  CL 108  CO2 19  BUN 6  CREATININE 0.94  CALCIUM 8.6  GLUCOSE 90     Imaging/Diagnostic Tests:   Tawni Carnes, MD 02/14/2013, 6:58 AM PGY-1, [facility] FPTS Intern pager: [phone], text pages welcome

## 2013-02-15 DIAGNOSIS — A5901 Trichomonal vulvovaginitis: Secondary | ICD-10-CM

## 2013-02-15 DIAGNOSIS — A499 Bacterial infection, unspecified: Secondary | ICD-10-CM

## 2013-02-15 DIAGNOSIS — N760 Acute vaginitis: Secondary | ICD-10-CM

## 2013-02-15 MED ORDER — POTASSIUM CHLORIDE CRYS ER 20 MEQ PO TBCR
40.0000 meq | EXTENDED_RELEASE_TABLET | Freq: Once | ORAL | Status: AC
  Administered 2013-02-15: 40 meq via ORAL
  Filled 2013-02-15: qty 2

## 2013-02-15 MED ORDER — BACLOFEN 10 MG PO TABS
10.0000 mg | ORAL_TABLET | Freq: Two times a day (BID) | ORAL | Status: DC
  Administered 2013-02-15 – 2013-02-19 (×9): 10 mg via ORAL
  Filled 2013-02-15 (×13): qty 1

## 2013-02-15 MED ORDER — METRONIDAZOLE 500 MG PO TABS
500.0000 mg | ORAL_TABLET | Freq: Two times a day (BID) | ORAL | Status: DC
  Administered 2013-02-15 – 2013-02-20 (×11): 500 mg via ORAL
  Filled 2013-02-15 (×17): qty 1

## 2013-02-15 NOTE — Progress Notes (Signed)
Family Medicine Teaching Service Daily Progress Note Addendum Intern Pager: [phone]  S: Patient complains 7/10 pain not improved since this AM but more tolerable overall since admission.  O: BP 142/78  Pulse 95  Temp(Src) 98.1 F (36.7 C) (Oral)  Resp 20  Ht 5\' 5"  (1.651 m)  Wt 198 lb (89.812 kg)  BMI 32.95 kg/m2  SpO2 98% JYN:WGNFAO in dark room with lights off, appears uncomfortable when woken up A/P: Status Migranosus-gradually improving, continue DHE protocol  Shelva Majestic, MD 02/15/2013, 3:16 PM PGY-3, [facility] FPTS Intern pager: [phone], text pages welcome

## 2013-02-15 NOTE — Progress Notes (Signed)
FMTS Attending Daily Note:  Renold Don MD  [phone] pager  Family Practice pager:  [phone] I have seen and examined this patient and have reviewed their chart. I have discussed this patient with the resident. I agree with the resident's findings, assessment and care plan.  Additionally:  Still in status migranosus, though gradually improving.  Somewhat nauseous now.  Able to eat breakfast, not so much lunch.  Some trapezius/latissimus tightness by history and exam.  No neck stiffness/pain.  No changes in sensorium.  Will try muscle relaxer for relief and to see if this aids any in migraine relief.  No evidence of SAH or other worrisome intracranial process.  Continue DHE protocol.   Tobey Grim, MD 02/15/2013 2:00 PM

## 2013-02-15 NOTE — Progress Notes (Signed)
Family Medicine Teaching Service Daily Progress Note Intern Pager: [phone]  Patient name: Kirsten Walsh Medical record number: [id number] Date of birth: [birth year] Age: 52 y.o. Gender: female  Primary Care Provider: [name], MD Consultants: none Code Status: Full  Pt Overview and Major Events to Date:  8/27 - Wet prep positive for Trich, BV  Assessment and Plan: SYESHA THAW is a 52 y.o. year old female presenting with intractable migraine headache for 4 days. Pt has a long-standing history of difficult-to-control migraine, and PMH otherwise significant for chronic body pain in various places, hypertension.   # Status migrainosus - long-standing hx of migraine, unrelieved at home with Triptans, etc  - unrelieved at outside ED with Dilaudid, Benadryl, and Reglan IV  - DDx could also include temporal arteritis given tenderness over right temple, though pt is young for this  - possible component of muscle pain/neck pain/back pain (pt with numerous pain complaints, per chart review)  - ordered for DHE protocol: DHE 1mg  q8 hrs, 4mg  decadron and 10mg  reglan 15 minutes before the DHE - [ ]  f/u symptomatic improvement on DHE protocol  # Trichomonas / BV: started complaining yesterday afternoon of yellow, malodorous discharge - Wet prep last night showed Trich (many) and Clue cells (few) - Started on flagyl 500mg  BID x 7 days - Only one 1 sexual partner in last 16 months, she is going to inform him of the infection and tell him he needs to be treated  # Hypertension - BP elevated intermittently to 150's, mostly with "waves of pain"  - continue home lisinopril-HCTZ  - monitor BP with other vitals per routine  - of note, pt has 0.1 mg/24h clonidine patches at home q-week; will not order, for now  - will need clarification of dose/rationale for clonidine as an outpt (uncertain if for BP or for migraine)  - Patient's outpatient clonidine patch fell off, reordered  yesterday  FEN/GI: regular diet, NS at 100 mL/h  Prophylaxis: subQ heparin  Disposition: discharge home pending symptomatic improvement  Subjective:  Patient doing better today. Was able to sleep last night without requiring any additional meds beyond DHE protocol. Headache still there, frontal, radiating to back on right side and down into right neck and shoulder. Still a little nauseas but is able to eat and has not had any vomiting since yesterday.   Objective: Temp:  [97.6 F (36.4 C)-98.7 F (37.1 C)] 97.6 F (36.4 C) (08/28 1478) Pulse Rate:  [69-96] 96 (08/28 0608) Resp:  [18-20] 20 (08/28 0608) BP: (129-138)/(64-77) 134/64 mmHg (08/28 0608) SpO2:  [98 %-100 %] 100 % (08/28 2956) Physical Exam: General: NAD, sitting in bed eating breakfast HEENT: NCAT, colored contacts limiting exam but appeared PERRL, EOMI. Right neck tender, ROM limited toward left due to pain. Cardiovascular: RRR, normal s1/s2, no m/r/g Respiratory: CTAB, effort normal Abdomen: soft, nontender, bowel sounds present Extremities: no edema.  Laboratory:  Recent Labs Lab 02/14/13 0120  WBC 4.9  HGB 13.8  HCT 39.8  PLT 203    Recent Labs Lab 02/14/13 0120  NA 138  K 3.2*  CL 108  CO2 19  BUN 6  CREATININE 0.94  CALCIUM 8.6  GLUCOSE 90    Imaging/Diagnostic Tests: Wet prep 8/27: Microscopic wet-mount exam shows clue cells, trichomonads, white blood cells.  Tawni Carnes, MD 02/15/2013, 7:12 AM PGY-1, [facility] FPTS Intern pager: [phone], text pages welcome

## 2013-02-16 DIAGNOSIS — M79609 Pain in unspecified limb: Secondary | ICD-10-CM

## 2013-02-16 LAB — TROPONIN I: Troponin I: <0.3 ng/mL (ref <0.30)

## 2013-02-16 MED ORDER — ONDANSETRON HCL 4 MG PO TABS
8.0000 mg | ORAL_TABLET | Freq: Four times a day (QID) | ORAL | Status: DC | PRN
  Administered 2013-02-17: 8 mg via ORAL
  Filled 2013-02-16 (×4): qty 2

## 2013-02-16 MED ORDER — ONDANSETRON HCL 8 MG PO TABS
8.0000 mg | ORAL_TABLET | Freq: Four times a day (QID) | ORAL | Status: AC
  Administered 2013-02-16: 8 mg via ORAL
  Filled 2013-02-16 (×2): qty 1

## 2013-02-16 MED ORDER — ONDANSETRON 8 MG/NS 50 ML IVPB
8.0000 mg | Freq: Four times a day (QID) | INTRAVENOUS | Status: AC
  Administered 2013-02-16: 8 mg via INTRAVENOUS
  Filled 2013-02-16 (×3): qty 8

## 2013-02-16 MED ORDER — ONDANSETRON 8 MG/NS 50 ML IVPB: 8.0000 mg | Freq: Four times a day (QID) | INTRAVENOUS | Status: DC

## 2013-02-16 MED ORDER — ONDANSETRON 8 MG/NS 50 ML IVPB
8.0000 mg | Freq: Four times a day (QID) | INTRAVENOUS | Status: DC | PRN
  Administered 2013-02-17: 8 mg via INTRAVENOUS
  Filled 2013-02-16 (×2): qty 8

## 2013-02-16 MED ORDER — ONDANSETRON HCL 8 MG PO TABS: 8.0000 mg | ORAL_TABLET | Freq: Four times a day (QID) | ORAL | Status: DC

## 2013-02-16 NOTE — Progress Notes (Signed)
FMTS Attending Daily Note:  Renold Don MD  [phone] pager  Family Practice pager:  [phone] I have seen and examined this patient and have reviewed their chart. I have discussed this patient with the resident. I agree with the resident's findings, assessment and care plan.  Additionally:  Headache improving, but still with moderate nausea although no vomiting.  Describes "burning" chest pain.  Also with new onset bradycardia which, on review of chart, I cannot find she has had in past.    Had cards workup about a year ago with negative Myoview Stress Echo.    Checking troponins and EKG although likely low yield for ACS.  Increasing Zofran to 8 mg with 2 scheduled dosages.  Once we break her nausea and she demonstrates good PO fluid intake off IVF, feel she can be discharged.    Tobey Grim, MD 02/16/2013 1:55 PM

## 2013-02-16 NOTE — Progress Notes (Signed)
FMTS PCP Note  Kirsten Walsh is a 52 yo female who presented in status migranosus. Has improved slightly since admission. Now with complaint of nausea and intermittent vomiting. I appreciate and agree with the excellent care provided by the inpatient team. If you there is anything I can do to assist in Kirsten Walsh care, please let me know.  [name], MD [facility]

## 2013-02-16 NOTE — Progress Notes (Signed)
INTERIM PROGRESS NOTE  Subjective:  Patient laying in bed conversing with friend and nurse. Patient feels slightly worse this afternoon, just feels "off" and weaker. Headache is about the same. She got up to the bathroom to take out her contacts with supervision by nurse and myself, did say she felt a little more dizzy and silver spots in vision she has been having increased slightly. Continues to be nauseas but first dose of zofran is only just going in, does not feel like eating her dinner.   Objective: BP 160/66  Pulse 35  Temp(Src) 98.6 F (37 C) (Oral)  Resp 22  Ht 5\' 5"  (1.651 m)  Wt 198 lb (89.812 kg)  BMI 32.95 kg/m2  SpO2 96% Pulse checked by me over 1 minute was 35. Repeat after exam was 40.  General: NAD HEENT: Colored contacts taken out for exam. PERRL, EOMI. I attempted retinal exam with opthalmoscope but was unable to visualize retina due to pupil constriction and patient blinking. CV: bradycardic, regular rhythm. Normal heart sounds, no m/r/g Resp: CTAB, effort normal Neuro: A+Ox3, CN grossly normal.  EKG: unconfirmed, read by me is sinus bradycardia, nonspecific flipped T wave in V6 Venous duplex: tech note says  A/P: 1. Bradycardia: not hypotensive, no AMS, no chest pain, no signs of acute heart failure. Etiology unclear, ?drug effect vs ?ICP vs ?cardiac  2. Headache: continue DHE protocol, 9th dose will be tomorrow.

## 2013-02-16 NOTE — Progress Notes (Signed)
Family Medicine Teaching Service Daily Progress Note Intern Pager: [phone]  Patient name: Kirsten Walsh Medical record number: [id number] Date of birth: 1960-07-21 Age: 52 y.o. Gender: female  Primary Care Provider: [name], MD Consultants: none Code Status: Full  Pt Overview and Major Events to Date:  8/27 - Wet prep positive for Trich, BV 8/28 - DHE protocol continued 8/29 - Bradycardic, EKG and troponin, venous doppler   Assessment and Plan: Kirsten Walsh is a 52 y.o. year old female presenting with intractable migraine headache for 4 days. Pt has a long-standing history of difficult-to-control migraine, and PMH otherwise significant for chronic body pain in various places, hypertension.   # Status migrainosus - long-standing hx of migraine, unrelieved at home with Triptans, etc  - unrelieved at outside ED with Dilaudid, Benadryl, and Reglan IV  - DDx could also include temporal arteritis given tenderness over right temple, though pt is young for this  - possible component of muscle pain/neck pain/back pain (pt with numerous pain complaints, per chart review)  - Continue DHE protocol, on dose 6 of 9: DHE 1mg  q8 hrs, 4mg  decadron and 10mg  reglan 15 minutes before the DHE  # Trichomonas / BV: started complaining yesterday afternoon of yellow, malodorous discharge - Wet prep last night showed Trich (many) and Clue cells (few) - Started on flagyl 500mg  BID x 7 days - Only one 1 sexual partner in last 16 months, she is going to inform him of the infection and tell him he needs to be treated  # Hypertension - BP elevated intermittently to 150's, mostly with "waves of pain"  - continue home lisinopril-HCTZ  - monitor BP with other vitals per routine  - of note, pt has 0.1 mg/24h clonidine patches at home q-week - will need clarification of dose/rationale for clonidine as an outpt (uncertain if for BP or for migraine)  - 155/76 this AM, bradycardia at 54 - EKG and  troponin ordered  # ?DVT: patient complaints of swelling in legs R>L last night, pain in calves bilaterally today with 1-2+ edema and area of firmness on right calf. - Venous doppler ordered  FEN/GI: regular diet, NS at 100 mL/h  Prophylaxis: subQ heparin  Disposition: discharge home pending symptomatic improvement  Subjective:  Patient doing about the same this morning. Headache still present, but she says it feels like the intensity is moving from the front left toward the right and back right. Still nauseas, eating about 50% of meal, no vomiting. Her vision about the same, still seeing spots. When asked about any hearing loss, she does mention that on her right side she feels like "something is there", but does not endorse hearing any pulsatile swoosh/fluid movement. Last night around 9pm she complained to nurse of swelling in legs, right > left, which was helped by elevating with pillows. This morning she says it is much better, but calves are tender. No complaints of shortness of breath or chest pain. She has only been out of bed/chair to go to the bathroom and shower.  Objective: Temp:  [98.1 F (36.7 C)-98.4 F (36.9 C)] 98.4 F (36.9 C) (08/29 0558) Pulse Rate:  [45-95] 45 (08/29 0558) Resp:  [18-20] 18 (08/29 0558) BP: (133-155)/(76-80) 155/76 mmHg (08/29 0558) SpO2:  [97 %-98 %] 98 % (08/29 0558) Physical Exam: General: NAD, sitting in bed eating breakfast HEENT: NCAT, colored contacts limiting exam but appeared PERRL, EOMI. Right neck tender, ROM limited toward left due to pain. Cardiovascular: bradycardic but regular  rhythm, normal s1/s2, no m/r/g Respiratory: CTAB, effort normal Abdomen: soft, nontender, bowel sounds present Extremities: 1+ edema bilaterally below knee. Calf tenderness bilaterally but no cords appreciated. On medial right upper calf there is an approx 3-4cm area of firmness.  Laboratory:  Recent Labs Lab 02/14/13 0120  WBC 4.9  HGB 13.8  HCT 39.8   PLT 203    Recent Labs Lab 02/14/13 0120  NA 138  K 3.2*  CL 108  CO2 19  BUN 6  CREATININE 0.94  CALCIUM 8.6  GLUCOSE 90    Imaging/Diagnostic Tests: Wet prep 8/27: Microscopic wet-mount exam shows clue cells, trichomonads, white blood cells.  Tawni Carnes, MD 02/16/2013, 7:20 AM PGY-1, [facility] FPTS Intern pager: [phone], text pages welcome

## 2013-02-16 NOTE — Progress Notes (Signed)
Right lower extremity venous duplex completed.  Right:  No evidence of DVT, superficial thrombosis, or Baker's cyst.  Left:  Negative for DVT in the common femoral vein.  

## 2013-02-16 NOTE — Progress Notes (Signed)
INTERIM PROGRESS NOTE  Subjective: Patient feels about the same as earlier today. Still has dizziness, feels weakened / "blah", nausea (better with zofran), still has spots in vision. Headache about the same.  I asked her if anything like this has ever happened before. She says in the past when she was put on DHE protocol she did have some bradycardia when she was first started on it that resolved over the course of the treatment. She doesn't remember exactly when this was.  Objective: BP 156/82  Pulse 40  Temp(Src) 99 F (37.2 C) (Oral)  Resp 18  Ht 5\' 5"  (1.651 m)  Wt 198 lb (89.812 kg)  BMI 32.95 kg/m2  SpO2 98% While I was in the room 2 BPs by the machine were read in the 170s/70-105 range  General: NAD HEENT: PERRL, EOMI CV: bradycardic, regular rhythm, no m/r/g Resp: CTAB and effort normal Abdomen: bowel sounds present, no tenderness Extremities: 2+ pitting edema bilaterally. Calves still mildly tender bilaterally. Firm area on medial right upper calf less pronounced now.  EKG: sinus brady Venous duplex: no evidence of DVT Troponin negative x1  A/P: 1. Bradycardia: not hypotensive, no AMS, no chest pain, no signs of acute heart failure. Etiology unclear, ?drug effect vs ?ICP vs ?cardiac. Continue to closely monitor.   2. Headache: continue DHE protocol, 9th dose will be tomorrow.

## 2013-02-17 DIAGNOSIS — R42 Dizziness and giddiness: Secondary | ICD-10-CM

## 2013-02-17 DIAGNOSIS — M7989 Other specified soft tissue disorders: Secondary | ICD-10-CM

## 2013-02-17 MED ORDER — MORPHINE SULFATE 2 MG/ML IJ SOLN
2.0000 mg | INTRAMUSCULAR | Status: AC | PRN
  Administered 2013-02-17 (×2): 2 mg via INTRAVENOUS
  Filled 2013-02-17 (×2): qty 1

## 2013-02-17 NOTE — Progress Notes (Signed)
Family Medicine Teaching Service Daily Progress Note Intern Pager: [phone]  Patient name: Kirsten Walsh Medical record number: [id number] Date of birth: 11-Dec-1960 Age: 52 y.o. Gender: female  Primary Care Provider: [name], MD Consultants: none Code Status: Full  Pt Overview and Major Events to Date:  8/27 - Wet prep positive for Trich, BV 8/28 - DHE protocol continued 8/29 - Bradycardic, EKG and troponin, venous doppler   Assessment and Plan: Kirsten Walsh is a 52 y.o. year old female presenting with intractable migraine headache for 4 days. Pt has a long-standing history of difficult-to-control migraine, and PMH otherwise significant for chronic body pain in various places, hypertension.   # Status migrainosus - long-standing hx of migraine, unrelieved at home with Triptans, etc - unrelieved at outside ED with Dilaudid, Benadryl, and Reglan IV, unrelieved by complete DHE protocol. - of note pt has rx for clonidine and uses patch but hasn't had since she is here.  Likely having clonidine withdrawal with hypertensive and bradycardic withdrawal effects. - DDx could also include temporal arteritis given tenderness over right temple, though pt is young for this, consider incranial pathology given diffuse R side weakness and dysconjugate gaze with EOM testing - possible component of muscle pain/neck pain/back pain (pt with numerous pain complaints, per chart review)  - Add Kpad  [ ]  discuss MRI of brain.  Consider given persistent symptoms and diffuse R sided weakness not otherwise well explained.  With HTN and bradycardia must consider increased ICP but can also be explained by clonidine withdrawl  # Hypertension & Bradycardia with dizziness- BP elevated intermittently to 150's, mostly with "waves of pain" - consider adverse effect from DHE protocol.   - continue home lisinopril-HCTZ  - monitor BP with other vitals per routine  - clonidine patch reordered  - 133/68  today, remains bradycardic at 49.   - Ekg reviewed and remarkable for bradycardia but no ischemia or other conduction abnormality [ ]  orthostatic VS  # Nausea -  Could be coming from Flagyl as well as above symptoms  # Trichomonas / BV: started complaining yesterday afternoon of yellow, malodorous discharge - Wet prep last night showed Trich (many) and Clue cells (few) - Started on flagyl 500mg  BID x 7 days - Only one 1 sexual partner in last 16 months, she is going to inform him of the infection and tell him he needs to be treated  # Lower Extremity Swelling:  Doppler negative for DVT. Continue to monitor  FEN/GI: regular diet, NS at 100 mL/h  Prophylaxis: subQ heparin  Disposition: discharge home pending symptomatic improvement  Subjective:  Pt reporting nausea, no vomiting.  Worsening focality to headache to behind R eye but overall better.  Was up walking last night and had to have assistance to get back to bed due to dizziness, lightheadedness and n/v  Objective: Temp:  [98.4 F (36.9 C)-99 F (37.2 C)] 98.5 F (36.9 C) (08/30 0530) Pulse Rate:  [35-51] 49 (08/30 0530) Resp:  [16-22] 18 (08/30 0530) BP: (133-183)/(66-86) 133/68 mmHg (08/30 0530) SpO2:  [96 %-100 %] 97 % (08/30 0530) Physical Exam: General: NAD, sitting in bed eating breakfast HEENT: NCAT, colored contacts limiting exam but appeared PERRL, EOMI. Right neck tender, ROM limited toward left due to pain. Cardiovascular: bradycardic but regular rhythm, normal s1/s2, no m/r/g Respiratory: CTAB, effort normal Abdomen: soft, nontender, bowel sounds present Extremities: 1+ edema bilaterally below knee. Calf tenderness bilaterally but no cords appreciated. On medial right upper calf there  is an approx 3-4cm area of firmness. Neuro: dysconjugate gaze with EOM testing, slight far horizontal nystagmus, diffuse R sided weakness without focality but diffusely 4/5 weakness, left sided strength 5+/5 in all myotomes.   Sensation grossly intact  Laboratory:  Recent Labs Lab 02/14/13 0120  WBC 4.9  HGB 13.8  HCT 39.8  PLT 203    Recent Labs Lab 02/14/13 0120  NA 138  K 3.2*  CL 108  CO2 19  BUN 6  CREATININE 0.94  CALCIUM 8.6  GLUCOSE 90    Imaging/Diagnostic Tests: Wet prep 8/27: Microscopic wet-mount exam shows clue cells, trichomonads, white blood cells.  Andrena Mews, DO 02/17/2013, 8:42 AM PGY-1, [place] [facility] FPTS Intern pager: [phone], text pages welcome
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
## 2013-02-18 ENCOUNTER — Inpatient Hospital Stay (HOSPITAL_COMMUNITY): Payer: Medicaid Other

## 2013-02-18 DIAGNOSIS — R001 Bradycardia, unspecified: Secondary | ICD-10-CM | POA: Clinically undetermined

## 2013-02-18 LAB — CBC
Hemoglobin: 15.4 g/dL — ABNORMAL HIGH (ref 12.0–15.0)
MCH: 31.7 pg (ref 26.0–34.0)
MCHC: 35.1 g/dL (ref 30.0–36.0)
Platelets: 215 10*3/uL (ref 150–400)

## 2013-02-18 LAB — COMPREHENSIVE METABOLIC PANEL
ALT: 24 U/L (ref 0–35)
AST: 13 U/L (ref 0–37)
CO2: 22 mEq/L (ref 19–32)
Calcium: 8.4 mg/dL (ref 8.4–10.5)
Chloride: 107 mEq/L (ref 96–112)
Creatinine, Ser: 1.17 mg/dL — ABNORMAL HIGH (ref 0.50–1.10)
GFR calc Af Amer: 62 mL/min — ABNORMAL LOW (ref >90)
GFR calc non Af Amer: 54 mL/min — ABNORMAL LOW (ref >90)
Glucose, Bld: 87 mg/dL (ref 70–99)
Total Bilirubin: 0.3 mg/dL (ref 0.3–1.2)

## 2013-02-18 MED ORDER — OXYCODONE-ACETAMINOPHEN 5-325 MG PO TABS
1.0000 | ORAL_TABLET | ORAL | Status: DC | PRN
  Administered 2013-02-18 – 2013-02-20 (×5): 2 via ORAL
  Filled 2013-02-18 (×5): qty 2

## 2013-02-18 MED ORDER — ONDANSETRON 8 MG PO TBDP
8.0000 mg | ORAL_TABLET | Freq: Four times a day (QID) | ORAL | Status: DC | PRN
  Administered 2013-02-18 – 2013-02-19 (×2): 8 mg via ORAL
  Filled 2013-02-18 (×3): qty 1

## 2013-02-18 MED ORDER — MORPHINE SULFATE 4 MG/ML IJ SOLN: 4.0000 mg | Freq: Once | INTRAMUSCULAR | Status: AC

## 2013-02-18 MED ORDER — SENNOSIDES-DOCUSATE SODIUM 8.6-50 MG PO TABS
1.0000 | ORAL_TABLET | Freq: Every day | ORAL | Status: DC | PRN
  Administered 2013-02-18: 1 via ORAL
  Filled 2013-02-18: qty 1

## 2013-02-18 MED ORDER — ONDANSETRON 8 MG/NS 50 ML IVPB
8.0000 mg | Freq: Three times a day (TID) | INTRAVENOUS | Status: DC
  Filled 2013-02-18 (×3): qty 8

## 2013-02-18 MED ORDER — ONDANSETRON 8 MG PO TBDP
8.0000 mg | ORAL_TABLET | Freq: Three times a day (TID) | ORAL | Status: DC | PRN
  Administered 2013-02-18 (×2): 8 mg via ORAL
  Filled 2013-02-18: qty 1

## 2013-02-18 MED ORDER — ACETAZOLAMIDE 250 MG PO TABS
250.0000 mg | ORAL_TABLET | Freq: Once | ORAL | Status: AC
  Administered 2013-02-18: 250 mg via ORAL
  Filled 2013-02-18: qty 1

## 2013-02-18 MED ORDER — ONDANSETRON HCL 8 MG PO TABS
8.0000 mg | ORAL_TABLET | Freq: Three times a day (TID) | ORAL | Status: DC
  Administered 2013-02-18: 8 mg via ORAL
  Filled 2013-02-18 (×3): qty 1

## 2013-02-18 MED ORDER — MORPHINE SULFATE 4 MG/ML IJ SOLN
4.0000 mg | Freq: Once | INTRAMUSCULAR | Status: AC
  Administered 2013-02-18: 4 mg via INTRAVENOUS
  Filled 2013-02-18: qty 1

## 2013-02-18 MED ORDER — ONDANSETRON 8 MG PO TBDP
8.0000 mg | ORAL_TABLET | Freq: Once | ORAL | Status: AC
  Administered 2013-02-18: 8 mg via ORAL
  Filled 2013-02-18: qty 1

## 2013-02-18 NOTE — Progress Notes (Signed)
FMTS Attending Daily Note:  Renold Don MD  [phone] pager  Family Practice pager:  [phone] I have seen and examined this patient and have reviewed their chart. I have discussed this patient with the resident. I agree with the resident's findings, assessment and care plan.  Additionally:  - Still with headache, as to be expected.   - Difficulty holding down PO due to nausea -- although she has not actually vomited in about 4 days - Cranial nerve testing reveals no deficits throughout.  No visual field deficits  - No evidence of increased intracranial pressure on MRI - Based on this, pseudotumor cerebri is much less likely.  - Will continue to watch today.  If we can control her nausea on oral medications, we can likely DC her home today.    Tobey Grim, MD 02/18/2013 10:55 AM

## 2013-02-18 NOTE — Progress Notes (Addendum)
Family Medicine Teaching Service Daily Progress Note Intern Pager: [phone]  Patient name: Kirsten Walsh Medical record number: [id number] Date of birth: 17-Apr-1961 Age: 52 y.o. Gender: female  Primary Care Provider: [name], MD Consultants: none Code Status: Full  Pt Overview and Major Events to Date:  8/27 - Wet prep positive for Trich, BV 8/28 - DHE protocol continued 8/29 - Bradycardic, EKG (sinus brady) and troponin (negative), LE venous doppler (negative)  Assessment and Plan: CARROL HOUGLAND is a 52 y.o. year old female presenting with intractable migraine headache for 4 days. Pt has a long-standing history of difficult-to-control migraine, and PMH otherwise significant for chronic body pain in various places, hypertension.   # Status migrainosus / persistent headache - long-standing hx of migraine, unrelieved at home with Triptans, etc - DDx broad, including true status migrainosus, increased ICP/intracranial pathology, clonidine withdrawal - unrelieved at outside ED with Dilaudid, Benadryl, and Reglan IV, unrelieved by complete DHE protocol - possible component of muscle pain/neck pain/back pain (pt with numerous pain complaints, per chart review) - MRI pending to evaluate for intracranial pathology - continue Tylenol PRN, baclofen for ?muscle spasm - morphine x1 this morning, though trying to transition away from narcotics - pt reports clonidine is for BP, changed weekly, which has been that way for "a long time" - could consider acetazolamide for headache and LE swelling but will await MRI  # Hypertension & Bradycardia with dizziness- BP intermittently elevated, normotensive, and relatively hypotensive - increased pain, dizziness mostly with "waves of pain" and with standing - possible adverse effects from DHE protocol - continue home lisinopril-HCTZ  - monitor BP with other vitals per routine (orthostatic 8/30) - clonidine patch reordered   # Nausea -  ?secondary to Flagyl as well as above symptoms - Zofran PRN; scheduling prior to meals for now as PO intake is decreased  # Trichomonas / BV: complaint of yellow, malodorous discharge 8/27-28 - Wet prep showed Trich (many) and Clue cells (few) - Started on flagyl 500mg  BID x 7 days - one sexual partner in last 16 months, pt to inform him of the infection and tell him he needs to be treated  # Lower Extremity Swelling:  Doppler negative for DVT. Continue to monitor. -?secondary to IVF, will start to reduce  FEN/GI: regular diet, NS at 50 mL/h  Prophylaxis: subQ heparin  Disposition: management as above, discharge home pending symptomatic improvement, MRI results, etc  Subjective: Pt seen at bedside, continues to report nausea worse with pills/food but no vomiting. Headache still present, mostly unchanged from the last few days, worse with standing/walking. Also with some feelings of "racing heartbeat" with increased pain and dizziness when standing up. Leg swelling still present, but better.  Objective: Temp:  [98.2 F (36.8 C)-99.1 F (37.3 C)] 98.4 F (36.9 C) (08/31 0619) Pulse Rate:  [37-58] 50 (08/31 0619) Resp:  [16-20] 16 (08/31 0619) BP: (96-171)/(57-89) 128/67 mmHg (08/31 0619) SpO2:  [98 %-100 %] 100 % (08/31 1478) Physical Exam: General: adult female in NAD Cardiovascular: bradycardic but regular rhythm, no murmur appreciated Respiratory: CTAB, effort normal Abdomen: soft, nontender, bowel sounds present Extremities: trace-1+ edema bilaterally below knee, not especially tender Neuro: strength 4/5 on right, 5/5 on left (unchanged/baseline), sensation intact, awake/alert/oriented  Laboratory:  Recent Labs Lab 02/14/13 0120  WBC 4.9  HGB 13.8  HCT 39.8  PLT 203    Recent Labs Lab 02/14/13 0120  NA 138  K 3.2*  CL 108  CO2 19  BUN 6  CREATININE 0.94  CALCIUM 8.6  GLUCOSE 90    Imaging/Diagnostic Tests: Wet prep 8/27: Microscopic wet-mount exam shows  clue cells, trichomonads, white blood cells.  Bobbye Morton, MD 02/18/2013, 9:48 AM PGY-2, [place] [facility] FPTS Intern pager: [phone], text pages welcome

## 2013-02-18 NOTE — Progress Notes (Signed)
PGY-2 Interim Note  Paged by RN, pt still complaining of headache, some relief with IV morphine earlier today. Nausea still present but pt able to tolerate some PO for breakfast and lunch with Zofran. BP relatively low around 2PM (systolic 90), rechecked during phone call with RN to systolic 112, HR still in the high 50's.  MRI not suggestive of increased ICP. Ordered for acetazolamide once in case increased pressure without obvious change on imaging is contributing, and also for LE edema; will consider short course of daily acetazolamide, though this likely will not be of much help with headaches. Ordered for Percocet 5-325 1-2 tabs q4, though with hold parameter for systolic BP <100. Will attempt to control headache and nausea with PO medications for hopeful discharge tomorrow.  Bobbye Morton, MD PGY-2, [facility] Family Medicine 02/18/2013, 4:01 PM FPTS Service pager: [phone] (text pages welcome through [facility])

## 2013-02-19 DIAGNOSIS — I498 Other specified cardiac arrhythmias: Secondary | ICD-10-CM

## 2013-02-19 LAB — BASIC METABOLIC PANEL
Calcium: 8.4 mg/dL (ref 8.4–10.5)
GFR calc Af Amer: 60 mL/min — ABNORMAL LOW (ref >90)
GFR calc non Af Amer: 52 mL/min — ABNORMAL LOW (ref >90)
Potassium: 3.5 mEq/L (ref 3.5–5.1)
Sodium: 135 mEq/L (ref 135–145)

## 2013-02-19 LAB — CBC
Hemoglobin: 14.7 g/dL (ref 12.0–15.0)
Platelets: 188 10*3/uL (ref 150–400)
RBC: 4.73 MIL/uL (ref 3.87–5.11)
WBC: 5.7 10*3/uL (ref 4.0–10.5)

## 2013-02-19 MED ORDER — SENNOSIDES-DOCUSATE SODIUM 8.6-50 MG PO TABS
2.0000 | ORAL_TABLET | Freq: Two times a day (BID) | ORAL | Status: DC
  Administered 2013-02-19 – 2013-02-20 (×2): 2 via ORAL
  Filled 2013-02-19 (×2): qty 2

## 2013-02-19 MED ORDER — SENNOSIDES-DOCUSATE SODIUM 8.6-50 MG PO TABS
1.0000 | ORAL_TABLET | Freq: Two times a day (BID) | ORAL | Status: DC
  Administered 2013-02-19: 1 via ORAL
  Filled 2013-02-19: qty 1

## 2013-02-19 MED ORDER — POLYETHYLENE GLYCOL 3350 17 G PO PACK
17.0000 g | PACK | Freq: Every day | ORAL | Status: DC
  Filled 2013-02-19: qty 1

## 2013-02-19 MED ORDER — BACLOFEN 10 MG PO TABS: 10.0000 mg | ORAL_TABLET | Freq: Two times a day (BID) | ORAL | Status: DC | PRN

## 2013-02-19 MED ORDER — MILK AND MOLASSES ENEMA
Freq: Once | RECTAL | Status: AC
  Administered 2013-02-19: 11:00:00 via RECTAL
  Filled 2013-02-19: qty 250
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
## 2013-02-19 NOTE — Progress Notes (Signed)
INTERIM PROGRESS NOTE  Patient feeling better after enema and BM. BM described as "a lot" and felt hard/impacted. Nausea and dizziness improved afterwards but not completely resolved. Wants to go home tonight but isn't sure if she feels like she can make it and leaning towards staying until tomorrow morning.   Pulse 60 measured by me  A/P: Patient to let nurse know if she would like to go home tonight if feeling better, otherwise plan on discharge tomorrow in AM. Continue miralax and senakote.

## 2013-02-19 NOTE — Progress Notes (Signed)
Family Medicine Teaching Service Daily Progress Note Intern Pager: [phone]  Patient name: Kirsten Walsh Medical record number: [id number] Date of birth: Oct 07, 1960 Age: 52 y.o. Gender: female  Primary Care Provider: [name], MD Consultants: none Code Status: Full  Pt Overview and Major Events to Date:  8/27 - Wet prep positive for Trich, BV 8/28 - DHE protocol continued 8/29 - Bradycardic, EKG (sinus brady) and troponin (negative), LE venous doppler (negative)  Assessment and Plan: Kirsten Walsh is a 52 y.o. year old female presenting with intractable migraine headache for 4 days. Pt has a long-standing history of difficult-to-control migraine, and PMH otherwise significant for chronic body pain in various places, hypertension.   # Status migrainosus / persistent headache: long-standing hx of migraine, unrelieved at home with Triptans, etc - DDx broad, including true status migrainosus, increased ICP/intracranial pathology, clonidine withdrawal - unrelieved at outside ED with Dilaudid, Benadryl, and Reglan IV, unrelieved by complete DHE protocol - possible component of muscle pain/neck pain/back pain (pt with numerous pain complaints, per chart review) - MRI showed no evidence of ICP, stable and normal appearance of brain - continue Tylenol PRN, baclofen switch to PRN for ?muscle spasm - Percocet PRN - pt reports clonidine is for BP, changed weekly, which has been that way for "a long time"  # Hypertension & Bradycardia with dizziness: BP intermittently elevated, normotensive, and relatively hypotensive - increased pain, dizziness mostly with "waves of pain" and with standing - possible adverse effects from DHE protocol - monitor BP with other vitals per routine (orthostatic 8/30) - clonidine patch reordered  - BPs lower today, 97/64, lisinopril-HCTZ held for now. Likely restart on d/c.  # Mild acute kidney injury: likely due poor po intake - holding potential  nephrotoxic meds  # Nausea - ?secondary to Flagyl as well as above symptoms - Zofran PRN; scheduling prior to meals for now as PO intake is decreased - Hasn't had BM since Tuesday, enema, senakote, miralax ordered this morning. - Reevaluate in the afternoon  # Trichomonas / BV: complaint of yellow, malodorous discharge 8/27-28 - Wet prep showed Trich (many) and Clue cells (few) - Started on flagyl 500mg  BID x 7 days - one sexual partner in last 16 months, pt to inform him of the infection and tell him he needs to be treated  # Lower Extremity Swelling:  Doppler negative for DVT. Continue to monitor. -Swelling much improved today, no complaints of tenderness  FEN/GI: regular diet  Prophylaxis: subQ heparin  Disposition: management as above, discharge home pending symptomatic improvement  Subjective:  Patient says she is unchanged, still complains of nausea, feeling "blah" especially when standing up and walking around. Headache still present but located on right side of head, neck, shoulder where it is her baseline. Still nauseas, hasn't been eating much over the past day. Has not had a BM since Tuesday, but passing gas. Percocet has helped the pain and to get her some sleep.  Objective: Temp:  [97.9 F (36.6 C)-98.6 F (37 C)] 98.6 F (37 C) (09/01 0544) Pulse Rate:  [54-63] 55 (09/01 0544) Resp:  [17-19] 17 (09/01 0544) BP: (90-112)/(64-77) 97/64 mmHg (09/01 0544) SpO2:  [97 %-100 %] 100 % (09/01 0544) Physical Exam: General: adult female in NAD sitting in chair Cardiovascular: bradycardic but regular rhythm, no murmur appreciated Respiratory: CTAB, effort normal Abdomen: soft, mild LLQ tenderness with dullness to percussion, bowel sounds present Extremities: trace-1+ edema bilaterally below knee, no tenderness Neuro: awake/alert/oriented, very slow to transfer  from chair to bed, requiring assistance and pauses after getting upright, slow gait.  Laboratory:  Recent  Labs Lab 02/14/13 0120 02/18/13 1030 02/19/13 0505  WBC 4.9 7.7 5.7  HGB 13.8 15.4* 14.7  HCT 39.8 43.9 43.3  PLT 203 215 188    Recent Labs Lab 02/14/13 0120 02/18/13 1030 02/19/13 0505  NA 138 138 135  K 3.2* 3.3* 3.5  CL 108 107 106  CO2 19 22 19   BUN 6 13 18   CREATININE 0.94 1.17* 1.21*  CALCIUM 8.6 8.4 8.4  PROT  --  5.8*  --   BILITOT  --  0.3  --   ALKPHOS  --  39  --   ALT  --  24  --   AST  --  13  --   GLUCOSE 90 87 116*    Imaging/Diagnostic Tests: Wet prep 8/27: Microscopic wet-mount exam shows clue cells, trichomonads, white blood cells.  MRI 8/31: IMPRESSION:  Stable, normal non contrast MRI appearance of the brain.  Tawni Carnes, MD 02/19/2013, 7:04 AM PGY-1, [facility] FPTS Intern pager: [phone], text pages welcome

## 2013-02-19 NOTE — Progress Notes (Addendum)
FMTS Attending Daily Note:  Renold Don MD  [phone] pager  Family Practice pager:  [phone] I have seen and examined this patient and have reviewed their chart. I have discussed this patient with the resident. I agree with the resident's findings, assessment and care plan.  Additionally:  -Headache now back to baseline. - Nausea is precluding discharge as she cannot tolerate po except sips of food and liquid - Constipated since Tuesday of last week, no BM.  Has not brought this up before today - Attempting clean-out, recheck on nausea after lunch.  If improved, DC home today with bowel regimen and close FU with PCP - AKI:  Stopping Lisinopril.  Creatinine has been over 1 in recent past.  FU BMET at PCP appt.  Trace to no edema on exam    Tobey Grim, MD 02/19/2013 1:12 PM

## 2013-02-20 MED ORDER — DIHYDROERGOTAMINE MESYLATE 4 MG/ML NA SOLN: 1.0000 | NASAL | Status: DC | PRN

## 2013-02-20 MED ORDER — POLYETHYLENE GLYCOL 3350 17 G PO PACK: 17.0000 g | PACK | Freq: Every day | ORAL | Status: DC

## 2013-02-20 MED ORDER — SENNOSIDES-DOCUSATE SODIUM 8.6-50 MG PO TABS: 2.0000 | ORAL_TABLET | Freq: Two times a day (BID) | ORAL | Status: DC

## 2013-02-20 MED ORDER — METRONIDAZOLE 500 MG PO TABS: 500.0000 mg | ORAL_TABLET | Freq: Two times a day (BID) | ORAL | Status: DC

## 2013-02-20 MED ORDER — PROMETHAZINE HCL 25 MG PO TABS: 25.0000 mg | ORAL_TABLET | Freq: Four times a day (QID) | ORAL | Status: DC

## 2013-02-20 MED ORDER — PROMETHAZINE HCL 25 MG PO TABS
25.0000 mg | ORAL_TABLET | Freq: Four times a day (QID) | ORAL | Status: DC
  Administered 2013-02-20: 25 mg via ORAL
  Filled 2013-02-20: qty 1

## 2013-02-20 NOTE — Evaluation (Signed)
Physical Therapy Evaluation Patient Details Name: Kirsten Walsh MRN: [id number] DOB: February 10, 1961 Today's Date: 02/20/2013 Time: 7829-5621 PT Time Calculation (min): 12 min  PT Assessment / Plan / Recommendation History of Present Illness  Kirsten Walsh is a 52 y.o. year old female presenting with intractable migraine headache for 4 days. Pt has a long-standing history of difficult-to-control migraine, and PMH otherwise significant for chronic body pain in various places, hypertension.  Clinical Impression  Pt admitted with above. Pt currently with functional limitations due to the deficits listed below (see PT Problem List).  Pt will benefit from skilled PT to increase their independence and safety with mobility to allow discharge to the venue listed below.  Pt required hand hold assist for ambulation to stabilize however declined any assistive device.  Pt feels she will return to baseline upon d/c and declines DME and HHPT at this time.  Will see pt if remains in acute to continue safely mobilizing.     PT Assessment  Patient needs continued PT services    Follow Up Recommendations  No PT follow up    Does the patient have the potential to tolerate intense rehabilitation      Barriers to Discharge        Equipment Recommendations  Other (comment) (PT recommended cane or RW during amb, pt refused AD.)    Recommendations for Other Services     Frequency Min 3X/week    Precautions / Restrictions Precautions Precautions: Fall   Pertinent Vitals/Pain Pt c/o headache and nausea with ambulation and repositioned in bed      Mobility  Bed Mobility Bed Mobility: Not assessed Transfers Transfers: Sit to Stand;Stand to Sit Sit to Stand: 4: Min guard;With upper extremity assist;From bed Stand to Sit: 4: Min guard;With upper extremity assist;To bed Details for Transfer Assistance: Pt performed sit<>stand transfers with min guard and increased time secondary to LE weakness,  head pain, and nausea. Ambulation/Gait Ambulation/Gait Assistance: 4: Min guard Ambulation Distance (Feet): 150 Feet Assistive device: 1 person hand held assist Ambulation/Gait Assistance Details: Pt. did not want to utilize an AD in order to ambulate but required 1 person hand held assist to stabilize, guarded gait due to nausea and headache Gait Pattern: Decreased trunk rotation;Narrow base of support;Step-through pattern Gait velocity: Decreased  General Gait Details: Pt ambulated with a narrow BOS with occassional scissoring of bil. LEs, pt also had decreased trunk rotation and pt reported incr. nausea during ambulation.    Exercises     PT Diagnosis: Difficulty walking;Generalized weakness;Acute pain  PT Problem List: Decreased strength;Decreased activity tolerance;Decreased mobility PT Treatment Interventions: Gait training;Functional mobility training;Therapeutic activities;Therapeutic exercise;Patient/family education     PT Goals(Current goals can be found in the care plan section) Acute Rehab PT Goals Patient Stated Goal: to go home. PT Goal Formulation: With patient Time For Goal Achievement: 02/27/13 Potential to Achieve Goals: Good  Visit Information  Last PT Received On: 02/20/13 Assistance Needed: +1 History of Present Illness: Kirsten Walsh is a 52 y.o. year old female presenting with intractable migraine headache for 4 days. Pt has a long-standing history of difficult-to-control migraine, and PMH otherwise significant for chronic body pain in various places, hypertension.       Prior Functioning  Home Living Family/patient expects to be discharged to:: Private residence Living Arrangements: Children Available Help at Discharge: Family;Friend(s) Type of Home: House Home Access: Level entry Home Layout: One level Home Equipment: None Prior Function Level of Independence: Independent Communication Communication: No  difficulties    Cognition   Cognition Arousal/Alertness: Awake/alert Behavior During Therapy: WFL for tasks assessed/performed Overall Cognitive Status: Within Functional Limits for tasks assessed    Extremity/Trunk Assessment Lower Extremity Assessment Lower Extremity Assessment: Generalized weakness (pt c/o feeling swollen in LEs ) Cervical / Trunk Assessment Cervical / Trunk Assessment: Normal   Balance    End of Session PT - End of Session Activity Tolerance: Patient limited by fatigue;Patient limited by pain Patient left: in bed;with call bell/phone within reach;with family/visitor present  GP     Kirsten Walsh,Kirsten Walsh 02/20/2013, 3:02 PM Zenovia Jarred, PT, DPT 02/20/2013 Pager: [phone]

## 2013-02-20 NOTE — Progress Notes (Signed)
Family Medicine Teaching Service Daily Progress Note Intern Pager: [phone]  Patient name: Kirsten Walsh Medical record number: [id number] Date of birth: September 14, 1960 Age: 52 y.o. Gender: female  Primary Care Provider: [name], MD Consultants: none Code Status: Full  Pt Overview and Major Events to Date:  8/27 - Wet prep positive for Trich, BV 8/28 - DHE protocol continued 8/29 - Bradycardic, EKG (sinus brady) and troponin (negative), LE venous doppler (negative) 8/30 - DHE stopped after 9 doses 9/1 - Enema, miralax, senakote  Assessment and Plan: Kirsten Walsh is a 52 y.o. year old female presenting with intractable migraine headache for 4 days. Pt has a long-standing history of difficult-to-control migraine, and PMH otherwise significant for chronic body pain in various places, hypertension.   # Status migrainosus / persistent headache: long-standing hx of migraine, unrelieved at home with Triptans, etc - DDx broad, including true status migrainosus, increased ICP/intracranial pathology, clonidine withdrawal - DHE protocol completed - possible component of muscle pain/neck pain/back pain (pt with numerous pain complaints, per chart review) - MRI showed no evidence of ICP, stable and normal appearance of brain - continue Tylenol PRN, baclofen switch to PRN for ?muscle spasm - Percocet PRN, taken 2 times each day past 2 days - Pt wants to go home today. Will send home with migranal  # Hypertension & Bradycardia with dizziness: BP intermittently elevated, normotensive, and relatively hypotensive - possible adverse effects from DHE protocol - Bradycardia slowly improving, 62 on exam - BPs lower today, 113/72, continue to hold lisinopril-HCTZ - BPs hypo to normotensive, don't restart lisinopril-hctz on d/c  # Mild acute kidney injury: likely due poor po intake - holding potential nephrotoxic meds  # Nausea - ?secondary to Flagyl as well as above symptoms - Zofran  PRN; scheduling prior to meals for now as PO intake is decreased - Enema, senakote, miralax: had large BM yesterday and symptoms improved  # Trichomonas / BV: complaint of yellow, malodorous discharge 8/27-28 - Wet prep showed Trich (many) and Clue cells (few) - one sexual partner in last 16 months, pt to inform him of the infection and tell him he needs to be treated - Discharge with 1 more day of flagyl  # Lower Extremity Swelling:  Doppler negative for DVT. Continue to monitor. -Swelling the same, mild tenderness. Patient has not been out of bed except to chair and bathroom.  FEN/GI: regular diet  Prophylaxis: subQ heparin  Disposition: management as above, discharge home pending symptomatic improvement  Subjective:  Patient better today. Had large BM yesterday after enema and medications. Headache at baseline, visual spots are at a minimum. Nausea improved but still present and still hasn't eaten that much. She wants to go home today.  Objective: Temp:  [97.9 F (36.6 C)-98.1 F (36.7 C)] 98.1 F (36.7 C) (09/01 2121) Pulse Rate:  [56-69] 69 (09/02 1200) Resp:  [18-20] 20 (09/02 1200) BP: (85-113)/(49-72) 113/72 mmHg (09/02 1200) SpO2:  [99 %-100 %] 100 % (09/01 2121) Physical Exam: General: adult female in NAD sitting in bed HEENT: NCAT, PERRL, EOMI Cardiovascular: borderline bradycardic but regular rhythm, no murmur appreciated Respiratory: CTAB, effort normal Abdomen: soft, no tenderness to palpation, bowel sounds present Extremities: 1+ edema bilaterally below knee, mild tenderness around mid calf Neuro: awake/alert/oriented, CN grossly normal  Laboratory:  Recent Labs Lab 02/14/13 0120 02/18/13 1030 02/19/13 0505  WBC 4.9 7.7 5.7  HGB 13.8 15.4* 14.7  HCT 39.8 43.9 43.3  PLT 203 215 188  Recent Labs Lab 02/14/13 0120 02/18/13 1030 02/19/13 0505  NA 138 138 135  K 3.2* 3.3* 3.5  CL 108 107 106  CO2 19 22 19   BUN 6 13 18   CREATININE 0.94 1.17*  1.21*  CALCIUM 8.6 8.4 8.4  PROT  --  5.8*  --   BILITOT  --  0.3  --   ALKPHOS  --  39  --   ALT  --  24  --   AST  --  13  --   GLUCOSE 90 87 116*    Imaging/Diagnostic Tests: Wet prep 8/27: Microscopic wet-mount exam shows clue cells, trichomonads, white blood cells.  MRI 8/31: IMPRESSION:  Stable, normal non contrast MRI appearance of the brain.  Tawni Carnes, MD 02/20/2013, 7:30 AM PGY-1, [facility] FPTS Intern pager: [phone], text pages welcome

## 2013-02-20 NOTE — Progress Notes (Signed)
FMTS Attending Note  Patient was seen and examined by me. The patient was discussed with the resident team and I agree with the resident assessment and plan.  Ms. Robards headache is back to baseline, she continues to be nauseated, no emesis, tolerating small sips of fluid, she would like to go home today  Intractable Migraine - resolved, continue home meds, to follow up with headache clinic as outpatient  Nausea - will attempt trial of phenergan in addition to Zofran  Disposition - if nausea improves patient will be discarged to home.

## 2013-02-20 NOTE — Progress Notes (Signed)
Discharge home. Home discharge instruction given, no question verbalized. 

## 2013-02-22 NOTE — Discharge Summary (Signed)
FMTS Attending Note  I actively participated in the care of this patient and agree with the resident discharge summary.

## 2013-02-27 ENCOUNTER — Encounter: Payer: Self-pay | Admitting: Family Medicine

## 2013-02-27 ENCOUNTER — Ambulatory Visit (INDEPENDENT_AMBULATORY_CARE_PROVIDER_SITE_OTHER): Payer: Medicaid Other | Admitting: Family Medicine

## 2013-02-27 VITALS — BP 150/100 | HR 69 | Ht 65.0 in | Wt 205.0 lb

## 2013-02-27 DIAGNOSIS — G43901 Migraine, unspecified, not intractable, with status migrainosus: Secondary | ICD-10-CM

## 2013-02-27 MED ORDER — KETOROLAC TROMETHAMINE 60 MG/2ML IM SOLN: 60.0000 mg | Freq: Once | INTRAMUSCULAR | Status: DC

## 2013-02-27 MED ORDER — KETOROLAC TROMETHAMINE 60 MG/2ML IM SOLN
60.0000 mg | Freq: Once | INTRAMUSCULAR | Status: AC
  Administered 2013-02-27: 60 mg via INTRAMUSCULAR

## 2013-02-27 MED ORDER — DIPHENHYDRAMINE HCL 25 MG PO CAPS
50.0000 mg | ORAL_CAPSULE | Freq: Once | ORAL | Status: AC
  Administered 2013-02-27: 50 mg via ORAL

## 2013-02-27 NOTE — Progress Notes (Addendum)
  Subjective:    Patient ID: Kirsten Walsh, female    DOB: 29-May-1961, 52 y.o.   MRN: [id number]  HPI 52 year old female presents for hospital followup of status migrainosus. Patient was discharged from the hospital one week ago. During hospital stay patient received DHE protocol as well as IV fluids and antiemetics. Patient states that since her time of discharge she has had a headache daily, associated nausea, the pain today is right-sided frontal with associated phonophobia and photophobia  Patient is previously been seen by Dr. Neale Burly (neurologist) for headaches. She did previously received Botox injections however was not able to continue this because of cost, she is not been back to see Dr. Neale Burly for close to 2 years now  She has previously had relief of headache with Toradol, Benadryl, and anti-emetics in the emergency room at is interested in this treatment today   Review of Systems  Constitutional: Negative for fever and chills.  Respiratory: Negative for chest tightness and wheezing.   Cardiovascular: Negative for chest pain.  Gastrointestinal: Positive for nausea. Negative for vomiting.  Neurological: Positive for headaches. Negative for dizziness, seizures, syncope, weakness and numbness.       Objective:   Physical Exam Vitals: Reviewed, blood pressure elevated with associated pain General: Pleasant African American female who appears in discomfort HEENT: Atraumatic, normocephalic, pupils are equal round reactive to light, extraocular movements were intact, patient had light sensitivity, Lasix membranes, neck was supple Cardiac: Regular rate and rhythm, S1 and S2 present, no murmurs, no heaves or thrills Respiratory: Clear to auscultation bilaterally, normal effort Neuro: Cranial nerves II through XII were intact, strength is 5 of 5 in all extremities, normal gait       Assessment & Plan:  Please see problem specific assessment and plan

## 2013-02-27 NOTE — Assessment & Plan Note (Signed)
Patient presents for hospital followup for admission of migraine with status migrainosus. Patient continues to have daily headache that is uncontrolled with Topamax, sumatriptan, Migranal, and anti-emetics. -Patient was given Toradol 60 mg IM and Benadryl 50 mg by mouth in office today an attempt to break her current headache -Patient was counseled to make followup appointment with Dr. Neale Burly at the headache clinic

## 2013-02-27 NOTE — Patient Instructions (Addendum)
Please call Dr. Neale Burly to set up a follow up appointment for your migraines [phone].

## 2013-02-27 NOTE — Addendum Note (Signed)
Addended by: Radene Ou on: 02/27/2013 10:55 AM   Modules accepted: Orders

## 2013-03-06 ENCOUNTER — Ambulatory Visit (INDEPENDENT_AMBULATORY_CARE_PROVIDER_SITE_OTHER): Payer: Medicaid Other | Admitting: Family Medicine

## 2013-03-06 ENCOUNTER — Other Ambulatory Visit (HOSPITAL_COMMUNITY)
Admission: RE | Admit: 2013-03-06 | Discharge: 2013-03-06 | Disposition: A | Discharge status: Home / Self Care | Payer: Medicaid Other | Source: Ambulatory Visit | Attending: Family Medicine | Admitting: Family Medicine

## 2013-03-06 VITALS — BP 134/90 | HR 86 | Temp 99.0°F | Ht 65.0 in | Wt 207.0 lb

## 2013-03-06 DIAGNOSIS — Z01419 Encounter for gynecological examination (general) (routine) without abnormal findings: Secondary | ICD-10-CM | POA: Insufficient documentation

## 2013-03-06 DIAGNOSIS — I10 Essential (primary) hypertension: Secondary | ICD-10-CM

## 2013-03-06 DIAGNOSIS — Z124 Encounter for screening for malignant neoplasm of cervix: Secondary | ICD-10-CM

## 2013-03-06 DIAGNOSIS — Z30431 Encounter for routine checking of intrauterine contraceptive device: Secondary | ICD-10-CM

## 2013-03-06 NOTE — Assessment & Plan Note (Signed)
Pap smear today. Last was normal. Will call with the results.

## 2013-03-06 NOTE — Assessment & Plan Note (Signed)
Strings visible and not due for new IUD until 02/11/14.

## 2013-03-06 NOTE — Patient Instructions (Signed)
Nice to see you again. We will call you with the results of the pap smear. I will fax your school paper work once I fill it out.

## 2013-03-06 NOTE — Progress Notes (Signed)
Patient ID: Kirsten Walsh, female   DOB: 07-28-1960, 52 y.o.   MRN: [id number] Kirsten Walsh is a 52 y.o. female who presents today for pap smear and IUD string check.  HYPERTENSION Disease Monitoring Home BP Monitoring not checking at home Chest pain- no    Dyspnea- no Medications Compliance-  On clonidine, was told to stop lisinopril.  Edema- no  Patient had pap 2 years ago with no abnormalities seen. Unsure of when she had her IUD placed (02/11/09) and wanted strings checked. Has intermittent episodes of spotting maybe every other month that last 1-2 days. Denies pain, dysuria. Has clear discharge. No itching.  Past Medical History  Diagnosis Date  . Hypertension   . Chest pain   . Gall stones   . Migraine     "qd" (02/14/2013)  . Chronic lower back pain     "right side to mid back" (02/14/2013)  . Kidney stone ~ 2006    History  Smoking status  . Never Smoker   Smokeless tobacco  . Never Used    Family History  Problem Relation Age of Onset  . Cancer Mother   . Cirrhosis Father     Current Outpatient Prescriptions on File Prior to Visit  Medication Sig Dispense Refill  . cloNIDine (CATAPRES - DOSED IN MG/24 HR) 0.1 mg/24hr patch Place 1 patch onto the skin once a week. Applied on Mondays.      Marland Kitchen dihydroergotamine (MIGRANAL) 4 MG/ML nasal spray Place 1 spray into the nose as needed for migraine. Use in one nostril as directed.  No more than 4 sprays in one hour  16 mL  1  . EPINEPHrine (EPIPEN) 0.3 mg/0.3 mL DEVI Inject 0.3 mg into the muscle once as needed. For allergic reaction      . ondansetron (ZOFRAN ODT) 4 MG disintegrating tablet Take 1 tablet (4 mg total) by mouth every 6 (six) hours as needed for nausea.  30 tablet  1  . promethazine (PHENERGAN) 25 MG suppository Place 25 mg rectally every 6 (six) hours as needed for nausea.      . promethazine (PHENERGAN) 25 MG tablet Take 1 tablet (25 mg total) by mouth every 6 (six) hours.  30 tablet  0  .  senna-docusate (SENOKOT-S) 8.6-50 MG per tablet Take 2 tablets by mouth 2 (two) times daily.  30 tablet  0  . SUMAtriptan (IMITREX) 50 MG tablet Take 1 tablet (50 mg total) by mouth every 2 (two) hours as needed for migraine. Do not exceed 200 mg in 24 hours.  20 tablet  0  . topiramate (TOPAMAX) 100 MG tablet Take 1 tablet (100 mg total) by mouth 2 (two) times daily.  60 tablet  1   No current facility-administered medications on file prior to visit.    ROS: Per HPI   Physical Exam Filed Vitals:   03/06/13 1507  BP: 134/90  Pulse: 86  Temp: 99 F (37.2 C)    Physical Examination: General appearance - alert, well appearing, and in no distress Skin - normal coloration and turgor, no rashes, no suspicious skin lesions noted Genitalia:  Normal introitus for age, no external lesions, minimal white vaginal discharge, mucosa pink and moist, no vaginal or cervical lesions, no vaginal atrophy, no friaility or hemorrhage, IUD strings visualized and in the correct location   Assessment/Plan: Please see individual problem list.

## 2013-03-06 NOTE — Assessment & Plan Note (Signed)
Controlled on clonidine patch. Will continue this and continue to monitor.

## 2013-03-08 ENCOUNTER — Telehealth: Payer: Self-pay | Admitting: *Deleted

## 2013-03-08 NOTE — Telephone Encounter (Signed)
Message left for pt to call back.  Please give pap results when she does. Thanks [employer]

## 2013-03-08 NOTE — Telephone Encounter (Signed)
Message copied by Henri Medal on Thu Mar 08, 2013 10:13 AM ------      Message from: Birdie Sons, ERIC G      Created: Wed Mar 07, 2013  4:45 PM       Patients pap smear was normal. Please inform patient. ------

## 2013-03-12 ENCOUNTER — Inpatient Hospital Stay (HOSPITAL_BASED_OUTPATIENT_CLINIC_OR_DEPARTMENT_OTHER)
Admission: EM | Admit: 2013-03-12 | Discharge: 2013-03-16 | DRG: 103 | Disposition: A | Discharge status: Home / Self Care | Payer: Medicaid Other | Attending: Family Medicine | Admitting: Family Medicine

## 2013-03-12 ENCOUNTER — Encounter (HOSPITAL_BASED_OUTPATIENT_CLINIC_OR_DEPARTMENT_OTHER): Payer: Self-pay | Admitting: *Deleted

## 2013-03-12 DIAGNOSIS — I10 Essential (primary) hypertension: Secondary | ICD-10-CM | POA: Diagnosis present

## 2013-03-12 DIAGNOSIS — I498 Other specified cardiac arrhythmias: Secondary | ICD-10-CM | POA: Diagnosis present

## 2013-03-12 DIAGNOSIS — M7989 Other specified soft tissue disorders: Secondary | ICD-10-CM

## 2013-03-12 DIAGNOSIS — E669 Obesity, unspecified: Secondary | ICD-10-CM

## 2013-03-12 DIAGNOSIS — I428 Other cardiomyopathies: Secondary | ICD-10-CM | POA: Diagnosis present

## 2013-03-12 DIAGNOSIS — Z79899 Other long term (current) drug therapy: Secondary | ICD-10-CM

## 2013-03-12 DIAGNOSIS — R42 Dizziness and giddiness: Secondary | ICD-10-CM

## 2013-03-12 DIAGNOSIS — G8929 Other chronic pain: Secondary | ICD-10-CM | POA: Diagnosis present

## 2013-03-12 DIAGNOSIS — IMO0002 Reserved for concepts with insufficient information to code with codable children: Secondary | ICD-10-CM

## 2013-03-12 DIAGNOSIS — G43901 Migraine, unspecified, not intractable, with status migrainosus: Secondary | ICD-10-CM

## 2013-03-12 DIAGNOSIS — G43711 Chronic migraine without aura, intractable, with status migrainosus: Principal | ICD-10-CM | POA: Diagnosis present

## 2013-03-12 LAB — BASIC METABOLIC PANEL
BUN: 9 mg/dL (ref 6–23)
Chloride: 107 mEq/L (ref 96–112)
Creatinine, Ser: 1 mg/dL (ref 0.50–1.10)
GFR calc Af Amer: 75 mL/min — ABNORMAL LOW (ref >90)
GFR calc non Af Amer: 65 mL/min — ABNORMAL LOW (ref >90)
Glucose, Bld: 99 mg/dL (ref 70–99)

## 2013-03-12 LAB — CBC
HCT: 38.2 % (ref 36.0–46.0)
HCT: 43.3 % (ref 36.0–46.0)
Hemoglobin: 12.9 g/dL (ref 12.0–15.0)
MCH: 31.7 pg (ref 26.0–34.0)
MCHC: 33.8 g/dL (ref 30.0–36.0)
MCHC: 34.6 g/dL (ref 30.0–36.0)
MCV: 93.4 fL (ref 78.0–100.0)
MCV: 91.5 fL (ref 78.0–100.0)
Platelets: ADEQUATE 10*3/uL (ref 150–400)
RBC: 4.73 MIL/uL (ref 3.87–5.11)
RDW: 13 % (ref 11.5–15.5)
RDW: 13.5 % (ref 11.5–15.5)

## 2013-03-12 LAB — CREATININE, SERUM
Creatinine, Ser: 0.81 mg/dL (ref 0.50–1.10)
GFR calc non Af Amer: 84 mL/min — ABNORMAL LOW (ref >90)

## 2013-03-12 MED ORDER — DEXAMETHASONE SODIUM PHOSPHATE 10 MG/ML IJ SOLN
10.0000 mg | Freq: Once | INTRAMUSCULAR | Status: AC
  Administered 2013-03-12: 10 mg via INTRAVENOUS
  Filled 2013-03-12: qty 1

## 2013-03-12 MED ORDER — CLONIDINE HCL 0.1 MG/24HR TD PTWK
0.1000 mg | MEDICATED_PATCH | TRANSDERMAL | Status: DC
  Administered 2013-03-12: 0.1 mg via TRANSDERMAL
  Filled 2013-03-12: qty 1

## 2013-03-12 MED ORDER — SODIUM CHLORIDE 0.9 % IV BOLUS (SEPSIS)
1000.0000 mL | Freq: Once | INTRAVENOUS | Status: AC
  Administered 2013-03-12: 1000 mL via INTRAVENOUS

## 2013-03-12 MED ORDER — METOCLOPRAMIDE HCL 5 MG/ML IJ SOLN
10.0000 mg | Freq: Three times a day (TID) | INTRAMUSCULAR | Status: DC
  Administered 2013-03-12 – 2013-03-13 (×3): 10 mg via INTRAVENOUS
  Filled 2013-03-12 (×5): qty 2

## 2013-03-12 MED ORDER — ONDANSETRON HCL 4 MG/2ML IJ SOLN
4.0000 mg | Freq: Four times a day (QID) | INTRAMUSCULAR | Status: DC | PRN
  Administered 2013-03-15 – 2013-03-16 (×2): 4 mg via INTRAVENOUS
  Filled 2013-03-12 (×2): qty 2

## 2013-03-12 MED ORDER — ONDANSETRON HCL 4 MG PO TABS
4.0000 mg | ORAL_TABLET | Freq: Four times a day (QID) | ORAL | Status: DC | PRN
  Administered 2013-03-14 – 2013-03-16 (×2): 4 mg via ORAL
  Filled 2013-03-12 (×2): qty 1

## 2013-03-12 MED ORDER — DEXAMETHASONE SODIUM PHOSPHATE 4 MG/ML IJ SOLN
4.0000 mg | Freq: Three times a day (TID) | INTRAMUSCULAR | Status: DC
  Administered 2013-03-12 – 2013-03-13 (×2): 4 mg via INTRAVENOUS
  Filled 2013-03-12 (×5): qty 1

## 2013-03-12 MED ORDER — ONDANSETRON 4 MG PO TBDP
ORAL_TABLET | ORAL | Status: AC
  Administered 2013-03-12: 4 mg via ORAL
  Filled 2013-03-12: qty 1

## 2013-03-12 MED ORDER — ACETAMINOPHEN 325 MG PO TABS
650.0000 mg | ORAL_TABLET | Freq: Four times a day (QID) | ORAL | Status: DC | PRN
  Administered 2013-03-13: 650 mg via ORAL
  Filled 2013-03-12 (×2): qty 2

## 2013-03-12 MED ORDER — MECLIZINE HCL 12.5 MG PO TABS
12.5000 mg | ORAL_TABLET | Freq: Two times a day (BID) | ORAL | Status: DC
  Administered 2013-03-12 – 2013-03-16 (×8): 12.5 mg via ORAL
  Filled 2013-03-12 (×9): qty 1

## 2013-03-12 MED ORDER — ONDANSETRON 4 MG PO TBDP
4.0000 mg | ORAL_TABLET | Freq: Once | ORAL | Status: AC
  Administered 2013-03-12: 4 mg via ORAL

## 2013-03-12 MED ORDER — DIHYDROERGOTAMINE MESYLATE 1 MG/ML IJ SOLN
1.0000 mg | Freq: Three times a day (TID) | INTRAMUSCULAR | Status: DC
  Administered 2013-03-12 – 2013-03-13 (×2): 1 mg via INTRAVENOUS
  Filled 2013-03-12 (×5): qty 1

## 2013-03-12 MED ORDER — ONDANSETRON HCL 4 MG/2ML IJ SOLN: 4.0000 mg | Freq: Once | INTRAMUSCULAR | Status: DC

## 2013-03-12 MED ORDER — PROMETHAZINE HCL 25 MG/ML IJ SOLN
25.0000 mg | Freq: Once | INTRAMUSCULAR | Status: AC
  Administered 2013-03-12: 25 mg via INTRAVENOUS
  Filled 2013-03-12: qty 1

## 2013-03-12 MED ORDER — TOPIRAMATE 100 MG PO TABS
100.0000 mg | ORAL_TABLET | Freq: Two times a day (BID) | ORAL | Status: DC
  Administered 2013-03-12 – 2013-03-16 (×8): 100 mg via ORAL
  Filled 2013-03-12 (×9): qty 1

## 2013-03-12 MED ORDER — ACETAMINOPHEN 650 MG RE SUPP: 650.0000 mg | Freq: Four times a day (QID) | RECTAL | Status: DC | PRN

## 2013-03-12 MED ORDER — ONDANSETRON HCL 4 MG/2ML IJ SOLN
INTRAMUSCULAR | Status: AC
  Filled 2013-03-12: qty 2

## 2013-03-12 MED ORDER — HYDROMORPHONE HCL PF 1 MG/ML IJ SOLN
1.0000 mg | Freq: Once | INTRAMUSCULAR | Status: AC
  Administered 2013-03-12: 1 mg via INTRAVENOUS
  Filled 2013-03-12: qty 1

## 2013-03-12 MED ORDER — HEPARIN SODIUM (PORCINE) 5000 UNIT/ML IJ SOLN
5000.0000 [IU] | Freq: Three times a day (TID) | INTRAMUSCULAR | Status: DC
  Administered 2013-03-12 – 2013-03-16 (×11): 5000 [IU] via SUBCUTANEOUS
  Filled 2013-03-12 (×14): qty 1

## 2013-03-12 MED ORDER — DIPHENHYDRAMINE HCL 50 MG/ML IJ SOLN
25.0000 mg | Freq: Once | INTRAMUSCULAR | Status: AC
  Administered 2013-03-12: 25 mg via INTRAVENOUS
  Filled 2013-03-12: qty 1

## 2013-03-12 MED ORDER — SENNA 8.6 MG PO TABS
1.0000 | ORAL_TABLET | Freq: Two times a day (BID) | ORAL | Status: DC
  Administered 2013-03-12 – 2013-03-16 (×8): 8.6 mg via ORAL
  Filled 2013-03-12 (×9): qty 1

## 2013-03-12 NOTE — ED Notes (Signed)
NP at bedside.

## 2013-03-12 NOTE — Progress Notes (Signed)
Pt adm to room 4N28; arrived on unit via EMS on stretcher. A & O and MAE X4. C/o nausea and migraine. Resting comfortable in bed and will continue to monitor quietly and notify MD of pt complaints. Rodell Perna Senica Crall RN.

## 2013-03-12 NOTE — H&P (Signed)
Family Medicine Teaching [facility] Admission History and Physical Service Pager: [phone]  Patient name: Kirsten Walsh Medical record number: [id number] Date of birth: [birth year] Age: 52 y.o. Gender: female  Primary Care Provider: [name], MD Consultants: none Code Status: Full  Chief Complaint: migraine  Assessment and Plan: [name] is a 52 y.o. female presenting with status migrainosis.  PMH is significant for difficult to control migraines and HTN.  # Status migrainosus: This is a recurrent problem.  Recent MRI in 01/2013 was normal.  States that the headache is typical of her migraines.  No focal neurologic deficits.  Will sometimes respond well to DHE protocol.  Current migraine is associated with vertigo which happens with some, but not all, of her migraines. - continue home Topamax - initial DHE protocol - reglan 10mg  IV, DHE 1mg  IV, Decadron 4mg  IV; repeat q8hrs - zofran for associated nausea - meclizine 12.5mg  BID to help with vertigo - PT to evaluate vestibular function  # Hypertension: Well controlled on admission. - continue home clonidine patch   FEN/GI: clear liquid diet; SLIV Prophylaxis: SQ heparin  Disposition: admit to inpatient telemetry; discharge pending improving in headache and nausea  History of Present Illness: Kirsten Walsh is a 52 y.o. female presenting with status migrainosus.  She was admitted and discharged for this earlier this month.  She reports doing okay at home initially, but gradually the headache returned.  Over the weekend, with all the sunshine, she reports worsening of her headache.  It is right sided and throbbing.  Associated with sensitivity to light and sound.  Reports feeling dizzy, like she is leaning and off balance, with the headache and severe nausea and vomiting.  Has only been able tolerate a few sips in the last day.  She reports a syncopal episode today that she attributes to the severe headache.   She states that this has happened before.  Denies any unilateral eye tearing or redness.  She has some tingling on her right extremities but this is unchanged from baseline.  Denies focal weakness.  No fevers, neck stiffness, chest pain, shortness of breath or urinary symptoms.  Has had some loose to runny stools the last 2-3 days.  She takes topamax for migraine prophylaxis.  She was seen at [facility] where they gave her a 1L NS bolus, decadron, benadryl, dilaudid, zofran, and phenergan with only temporary relief of nausea, no improvement in headache.  Review Of Systems: Per HPI with the following additions: none Otherwise 12 point review of systems was performed and was unremarkable.  Patient Active Problem List   Diagnosis Date Noted  . IUD check up 03/06/2013  . Pap smear, as part of routine gynecological examination 03/06/2013  . Bradycardia 02/18/2013  . Migraine with status migrainosus 02/14/2013  . Left knee pain 01/16/2013  . Cardiomyopathy 10/20/2011  . Decreased ejection fraction 10/05/2011  . Leg swelling 09/23/2011  . Complex regional pain syndrome 05/03/2011  . Chronic pain 03/29/2011  . Trigger point with neck pain 11/05/2010  . KNEE PAIN, RIGHT 05/26/2010  . ARM PAIN, RIGHT 05/06/2010  . NECK PAIN, RIGHT 02/03/2010  . ANKLE PAIN, RIGHT 12/17/2009  . BACK PAIN, ACUTE 11/18/2009  . ALLERGIC RHINITIS 01/30/2009  . DIZZINESS 07/16/2008  . MIGRAINE, COMMON W/INTRACTABLE MIGRAINE 04/26/2007  . OBESITY, NOS 08/18/2006  . HYPERTENSION, BENIGN SYSTEMIC 08/18/2006   Past Medical History: Past Medical History  Diagnosis Date  . Hypertension   . Chest pain   .  Gall stones   . Migraine     "qd" (02/14/2013)  . Chronic lower back pain     "right side to mid back" (02/14/2013)  . Kidney stone ~ 2006   Past Surgical History: Past Surgical History  Procedure Laterality Date  . Laparoscopic cholecystectomy  ~ 2004  . Cystoscopy w/ stone manipulation  ~ 2006  .  Intrauterine device insertion      "initially put in in 04/2002; changed prn" (02/14/2013)   Social History: History  Substance Use Topics  . Smoking status: Never Smoker   . Smokeless tobacco: Never Used  . Alcohol Use: Yes     Comment: 02/14/2013 "socially; couple times/month"   Additional social history: no history of drug use  Please also refer to relevant sections of EMR.  Family History: Family History  Problem Relation Age of Onset  . Cancer Mother   . Cirrhosis Father    Allergies and Medications: Allergies  Allergen Reactions  . Apple Anaphylaxis  . Aspirin Anaphylaxis  . Carrot [Daucus Carota] Anaphylaxis   No current facility-administered medications on file prior to encounter.   Current Outpatient Prescriptions on File Prior to Encounter  Medication Sig Dispense Refill  . cloNIDine (CATAPRES - DOSED IN MG/24 HR) 0.1 mg/24hr patch Place 1 patch onto the skin once a week. Applied on Mondays.      Marland Kitchen dihydroergotamine (MIGRANAL) 4 MG/ML nasal spray Place 1 spray into the nose as needed for migraine. Use in one nostril as directed.  No more than 4 sprays in one hour  16 mL  1  . EPINEPHrine (EPIPEN) 0.3 mg/0.3 mL DEVI Inject 0.3 mg into the muscle once as needed. For allergic reaction      . ondansetron (ZOFRAN ODT) 4 MG disintegrating tablet Take 1 tablet (4 mg total) by mouth every 6 (six) hours as needed for nausea.  30 tablet  1  . promethazine (PHENERGAN) 25 MG suppository Place 25 mg rectally every 6 (six) hours as needed for nausea.      . promethazine (PHENERGAN) 25 MG tablet Take 1 tablet (25 mg total) by mouth every 6 (six) hours.  30 tablet  0  . SUMAtriptan (IMITREX) 50 MG tablet Take 1 tablet (50 mg total) by mouth every 2 (two) hours as needed for migraine. Do not exceed 200 mg in 24 hours.  20 tablet  0  . topiramate (TOPAMAX) 100 MG tablet Take 1 tablet (100 mg total) by mouth 2 (two) times daily.  60 tablet  1  . senna-docusate (SENOKOT-S) 8.6-50 MG per  tablet Take 2 tablets by mouth 2 (two) times daily.  30 tablet  0    Objective: BP 128/83  Pulse 71  Temp(Src) 98.8 F (37.1 C) (Oral)  Resp 16  SpO2 99% Exam: General: alert, cooperative, NAD HEENT: AT/[place], sclera white, EOMI, MMM, no LAD Cardiovascular: RRR, no murmurs Respiratory: CTAB, no wheezes or rales Abdomen: +BS, soft, NTND Extremities: trace edema bilateral to knees, good DP pulses Skin: no rashes or lesions Neuro: alert, oriented, 5/5 strength throughout, sensation grossly intact  Labs and Imaging: CBC BMET   Recent Labs Lab 03/12/13 1351  WBC 3.7*  HGB 12.9  HCT 38.2  PLT 192    Recent Labs Lab 03/12/13 1351  NA 138  K 3.8  CL 107  CO2 25  BUN 9  CREATININE 1.00  GLUCOSE 99  CALCIUM 9.1     EKG: NSR, normal intervals, no ST or T-wave changes  Denny Peon  Chip Boer, MD 03/12/2013, 7:47 PM PGY-3, [facility] FPTS Intern pager: [phone], text pages welcome

## 2013-03-12 NOTE — ED Notes (Signed)
Pt reports " migraine " for several days with syncopal episode today while getting into car, brought in by EMS

## 2013-03-12 NOTE — ED Notes (Signed)
[facility] has been notified of room 4N27C

## 2013-03-12 NOTE — ED Provider Notes (Signed)
CSN: [id number]     Arrival date & time 03/12/13  1328 History   First MD Initiated Contact with Patient 03/12/13 1341     Chief Complaint  Patient presents with  . Loss of Consciousness  . Migraine   (Consider location/radiation/quality/duration/timing/severity/associated sxs/prior Treatment) HPI Comments: Pt states that she has a history of migraines and she has been having problems with the pain not being controlled:pt states that she was admitted recently and multiple things were tried:pt states that the last 4 days the pain has not been controlled at all:pt states that she was walking and got dizzy with the pain and nausea so she went to sit in her car and when she got up the next thing she knew she was on the ground:pt states that she has done this before and the headache is no different then previous headache  The history is provided by the patient. No language interpreter was used.    Past Medical History  Diagnosis Date  . Hypertension   . Chest pain   . Gall stones   . Migraine     "qd" (02/14/2013)  . Chronic lower back pain     "right side to mid back" (02/14/2013)  . Kidney stone ~ 2006   Past Surgical History  Procedure Laterality Date  . Laparoscopic cholecystectomy  ~ 2004  . Cystoscopy w/ stone manipulation  ~ 2006  . Intrauterine device insertion      "initially put in in 04/2002; changed prn" (02/14/2013)   Family History  Problem Relation Age of Onset  . Cancer Mother   . Cirrhosis Father    History  Substance Use Topics  . Smoking status: Never Smoker   . Smokeless tobacco: Never Used  . Alcohol Use: Yes     Comment: 02/14/2013 "socially; couple times/month"   OB History   Grav Para Term Preterm Abortions TAB SAB Ect Mult Living                 Review of Systems  Constitutional: Negative.   Respiratory: Negative.   Cardiovascular: Negative.     Allergies  Apple; Aspirin; and Carrot  Home Medications   Current Outpatient Rx  Name  Route   Sig  Dispense  Refill  . cloNIDine (CATAPRES - DOSED IN MG/24 HR) 0.1 mg/24hr patch   Transdermal   Place 1 patch onto the skin once a week. Applied on Mondays.         Marland Kitchen dihydroergotamine (MIGRANAL) 4 MG/ML nasal spray   Nasal   Place 1 spray into the nose as needed for migraine. Use in one nostril as directed.  No more than 4 sprays in one hour   16 mL   1   . EPINEPHrine (EPIPEN) 0.3 mg/0.3 mL DEVI   Intramuscular   Inject 0.3 mg into the muscle once as needed. For allergic reaction         . ondansetron (ZOFRAN ODT) 4 MG disintegrating tablet   Oral   Take 1 tablet (4 mg total) by mouth every 6 (six) hours as needed for nausea.   30 tablet   1   . promethazine (PHENERGAN) 25 MG suppository   Rectal   Place 25 mg rectally every 6 (six) hours as needed for nausea.         . promethazine (PHENERGAN) 25 MG tablet   Oral   Take 1 tablet (25 mg total) by mouth every 6 (six) hours.   30 tablet  0   . senna-docusate (SENOKOT-S) 8.6-50 MG per tablet   Oral   Take 2 tablets by mouth 2 (two) times daily.   30 tablet   0   . SUMAtriptan (IMITREX) 50 MG tablet   Oral   Take 1 tablet (50 mg total) by mouth every 2 (two) hours as needed for migraine. Do not exceed 200 mg in 24 hours.   20 tablet   0   . topiramate (TOPAMAX) 100 MG tablet   Oral   Take 1 tablet (100 mg total) by mouth 2 (two) times daily.   60 tablet   1    BP 87/68  Pulse 81  Temp(Src) 98.8 F (37.1 C) (Oral)  Resp 18  SpO2 100% Physical Exam  Nursing note and vitals reviewed. Constitutional: She is oriented to person, place, and time. She appears well-developed and well-nourished.  HENT:  Head: Normocephalic and atraumatic.  Right Ear: External ear normal.  Left Ear: External ear normal.  Eyes: Conjunctivae and EOM are normal. Pupils are equal, round, and reactive to light.  Neck: Normal range of motion. Neck supple.  Cardiovascular: Normal rate and regular rhythm.   Pulmonary/Chest:  Effort normal and breath sounds normal.  Abdominal: Soft. Bowel sounds are normal. There is no tenderness.  Musculoskeletal: Normal range of motion.  Neurological: She is alert and oriented to person, place, and time. Coordination normal.  Skin: Skin is warm and dry.  Psychiatric: She has a normal mood and affect.    ED Course  Procedures (including critical care time) Labs Review Labs Reviewed  CBC - Abnormal; Notable for the following:    WBC 3.7 (*)    All other components within normal limits  BASIC METABOLIC PANEL - Abnormal; Notable for the following:    GFR calc non Af Amer 65 (*)    GFR calc Af Amer 75 (*)    All other components within normal limits    Date: 03/12/2013  Rate: 72  Rhythm: normal sinus rhythm  QRS Axis: normal  Intervals: normal  ST/T Wave abnormalities: normal  Conduction Disutrbances:none  Narrative Interpretation:   Old EKG Reviewed: changes noted   Imaging Review No results found.  MDM   1. Migraine, chronic, without aura, intractable, with status migrainosus      Tried to ambulate pt and pt  Continues to feel dizzy;pt started vomiting again and states that the medication didn't help at [facility] call for admission:pt has mri in last admission:don't think any imaging is needed at this time:[facility]    Teressa Lower, NP 03/12/13 1655

## 2013-03-12 NOTE — ED Notes (Signed)
Ambulates with difficulty,  Patient states she feels very light headed and has blurred vision.  RN & NP made aware.  On return to patient's room patient vomited.

## 2013-03-13 ENCOUNTER — Encounter (HOSPITAL_COMMUNITY): Payer: Self-pay | Admitting: *Deleted

## 2013-03-13 DIAGNOSIS — I429 Cardiomyopathy, unspecified: Secondary | ICD-10-CM

## 2013-03-13 LAB — CBC
HCT: 44.5 % (ref 36.0–46.0)
Hemoglobin: 15.5 g/dL — ABNORMAL HIGH (ref 12.0–15.0)
MCH: 31.8 pg (ref 26.0–34.0)
MCHC: 34.8 g/dL (ref 30.0–36.0)
RDW: 13.3 % (ref 11.5–15.5)

## 2013-03-13 LAB — BASIC METABOLIC PANEL
BUN: 7 mg/dL (ref 6–23)
Chloride: 103 mEq/L (ref 96–112)
Creatinine, Ser: 0.88 mg/dL (ref 0.50–1.10)
Glucose, Bld: 177 mg/dL — ABNORMAL HIGH (ref 70–99)
Potassium: 3.9 mEq/L (ref 3.5–5.1)

## 2013-03-13 LAB — RAPID URINE DRUG SCREEN, HOSP PERFORMED
Amphetamines: NOT DETECTED
Barbiturates: NOT DETECTED
Opiates: NOT DETECTED

## 2013-03-13 MED ORDER — METOCLOPRAMIDE HCL 5 MG/ML IJ SOLN
10.0000 mg | Freq: Three times a day (TID) | INTRAMUSCULAR | Status: DC
  Filled 2013-03-13: qty 2

## 2013-03-13 MED ORDER — HYDROCODONE-ACETAMINOPHEN 10-325 MG PO TABS
1.0000 | ORAL_TABLET | Freq: Once | ORAL | Status: AC
  Administered 2013-03-13: 1 via ORAL
  Filled 2013-03-13: qty 1

## 2013-03-13 MED ORDER — DIPHENHYDRAMINE HCL 25 MG PO CAPS: 25.0000 mg | ORAL_CAPSULE | Freq: Three times a day (TID) | ORAL | Status: DC

## 2013-03-13 MED ORDER — KCL IN DEXTROSE-NACL 40-5-0.45 MEQ/L-%-% IV SOLN
INTRAVENOUS | Status: DC
  Filled 2013-03-13 (×5): qty 1000

## 2013-03-13 MED ORDER — HYDROMORPHONE HCL PF 1 MG/ML IJ SOLN
1.0000 mg | Freq: Once | INTRAMUSCULAR | Status: AC
  Administered 2013-03-13: 1 mg via INTRAMUSCULAR

## 2013-03-13 MED ORDER — DEXAMETHASONE SODIUM PHOSPHATE 4 MG/ML IJ SOLN
4.0000 mg | Freq: Two times a day (BID) | INTRAMUSCULAR | Status: DC
  Administered 2013-03-13 – 2013-03-14 (×2): 4 mg via INTRAMUSCULAR
  Filled 2013-03-13 (×2): qty 1

## 2013-03-13 MED ORDER — DIPHENHYDRAMINE HCL 50 MG/ML IJ SOLN
25.0000 mg | Freq: Three times a day (TID) | INTRAMUSCULAR | Status: DC
  Administered 2013-03-13: 25 mg via INTRAMUSCULAR
  Filled 2013-03-13 (×2): qty 0.5

## 2013-03-13 MED ORDER — DIPHENHYDRAMINE HCL 25 MG PO CAPS
25.0000 mg | ORAL_CAPSULE | Freq: Three times a day (TID) | ORAL | Status: DC
  Administered 2013-03-14 (×2): 25 mg via ORAL
  Filled 2013-03-13 (×3): qty 1

## 2013-03-13 MED ORDER — HYDROMORPHONE HCL PF 1 MG/ML IJ SOLN
1.5000 mg | Freq: Four times a day (QID) | INTRAMUSCULAR | Status: DC | PRN
  Administered 2013-03-13: 1.5 mg via INTRAVENOUS
  Filled 2013-03-13: qty 2

## 2013-03-13 MED ORDER — DIHYDROERGOTAMINE MESYLATE 1 MG/ML IJ SOLN
1.0000 mg | Freq: Three times a day (TID) | INTRAMUSCULAR | Status: DC
  Administered 2013-03-14: 1 mg via INTRAMUSCULAR
  Filled 2013-03-13 (×4): qty 1

## 2013-03-13 MED ORDER — DIHYDROERGOTAMINE MESYLATE 1 MG/ML IJ SOLN: 1.0000 mg | Freq: Once | INTRAMUSCULAR | Status: DC

## 2013-03-13 MED ORDER — DIPHENHYDRAMINE HCL 50 MG/ML IJ SOLN
25.0000 mg | Freq: Three times a day (TID) | INTRAMUSCULAR | Status: DC
  Administered 2013-03-14: 25 mg via INTRAMUSCULAR
  Filled 2013-03-13 (×3): qty 0.5

## 2013-03-13 MED ORDER — METOCLOPRAMIDE HCL 5 MG/ML IJ SOLN
10.0000 mg | Freq: Three times a day (TID) | INTRAMUSCULAR | Status: DC
  Filled 2013-03-13 (×2): qty 2

## 2013-03-13 MED ORDER — HYDROMORPHONE HCL PF 1 MG/ML IJ SOLN
1.0000 mg | INTRAMUSCULAR | Status: DC | PRN
  Administered 2013-03-13 (×2): 1 mg via INTRAVENOUS
  Filled 2013-03-13 (×4): qty 1

## 2013-03-13 MED ORDER — METOCLOPRAMIDE HCL 5 MG/ML IJ SOLN
10.0000 mg | Freq: Three times a day (TID) | INTRAMUSCULAR | Status: DC
  Administered 2013-03-14 (×2): 10 mg via INTRAMUSCULAR
  Filled 2013-03-13 (×4): qty 2

## 2013-03-13 MED ORDER — DIHYDROERGOTAMINE MESYLATE 1 MG/ML IJ SOLN
1.0000 mg | Freq: Once | INTRAMUSCULAR | Status: AC
  Administered 2013-03-13: 1 mg via INTRAMUSCULAR
  Filled 2013-03-13: qty 1

## 2013-03-13 MED ORDER — HYDROMORPHONE HCL PF 1 MG/ML IJ SOLN
1.0000 mg | Freq: Once | INTRAMUSCULAR | Status: AC
  Administered 2013-03-13: 1 mg via INTRAMUSCULAR
  Filled 2013-03-13: qty 1

## 2013-03-13 MED ORDER — HYDROMORPHONE HCL PF 1 MG/ML IJ SOLN
1.0000 mg | INTRAMUSCULAR | Status: DC | PRN
  Administered 2013-03-14 (×2): 1 mg via INTRAMUSCULAR
  Filled 2013-03-13 (×2): qty 1

## 2013-03-13 NOTE — H&P (Signed)
FMTS Attending Admission Note: Kirsten Lampson,MD I  have seen and examined this patient, reviewed their chart. I have discussed this patient with the resident. I agree with the resident's findings, assessment and care plan.  52 Y/O F with Pmx of Migraine headache and HTN was sent to the hospital from outpatient setting for persistent headache and elevated BP,her headache has been ongoing for 3 days which worsened yesterday,she is normally on Topamax 100 mg daily which helps some with her headache,but not so much in the past few days. There is associated N/V 24 hrs ago,floaters in her eyes prior to onset of headache,associated with phono and photophobia.She denies any new limb weakness but occasionally would have tingling sensation on her toes. patient had been to the headache clinic in the past where she was treated with botox,this helped a lot and would like to re-establish care [facility].  Ceasar Mons Vitals:   03/12/13 1835 03/12/13 1936 03/13/13 0215 03/13/13 1003  BP: 128/83 142/85 147/72 158/87  Pulse: 71 65 58 59  Temp:  99.7 F (37.6 C) 98.1 F (36.7 C) 98.8 F (37.1 C)  TempSrc:  Oral Oral Oral  Resp: 16 18 18 18   Height:  5\' 5"  (1.651 m)    Weight:  210 lb 1.6 oz (95.3 kg)    SpO2: 99% 100% 97% 98%     Exam; Gen: Comfortable in bed,not in distress. Neuro: CN intact,DTR++, Slight right extremity weakness (Not new,she had this since accident in 2001). No sensory loss. No signs of meningeal irritation. Resp: Air entry equal B/L. Heart: S1 S2 normal no murmurs. Abd: benign. Ext; No edema.  A/P: Migrainosus: Chronic recurrent. Had done well on Decadron,DHE and reglan,agree with restarting this. Continue Topamax. MRI in Aug 2014 reviewed,no abnormal finding. Monitor for improvement. Tylenol prn pain. F/U at the [facility] center upon D/C from the hospital.  HTN: BP improved. Continue home regimen.

## 2013-03-13 NOTE — Evaluation (Signed)
Physical Therapy Evaluation Patient Details Name: Kirsten Walsh MRN: [id number] DOB: [birth year] Today's Date: 03/13/2013 Time: 7829-5621 PT Time Calculation (min): 26 min  PT Assessment / Plan / Recommendation History of Present Illness  52 y.o. female presenting with status migrainosis.  PMH is significant for difficult to control migraines and HTN.  Clinical Impression  Pt admitted with above. Pt currently with functional limitations due to the deficits listed below (see PT Problem List).  Pt will benefit from skilled PT to increase their independence and safety with mobility to allow discharge to the venue listed below.  Pt reports no spinning sensation and disequilibrium worse when up OOB, and signs and symptoms did not correlate with BPPV so did not test.  Pt reports pain and tightness in R shoulder, neck, head and presents with limited AROM of cervical spine due to pain.  Pt reports disequilibrium to R side when ambulating.  Pt with c/o R sided neck and head pain as well as dizziness also described sometimes as pressure with oculomotor exam including smooth pursuits, saccades, VOR, head shaking, head thrust, and VOR cancellation as well as difficulty focusing on object with above tests (continuous blinking despite verbal cues).  Pt denies tinnitus and hearing loss.  Pt appears to present with symptoms of cervicogenic dizziness and would benefit from outpatient neuro PT for best outcomes.     PT Assessment  Patient needs continued PT services    Follow Up Recommendations  Outpatient PT;Supervision for mobility/OOB (neuro outpatient PT)    Does the patient have the potential to tolerate intense rehabilitation      Barriers to Discharge        Equipment Recommendations  Rolling walker with 5" wheels (however pt not accepting of AD at this time)    Recommendations for Other Services     Frequency Min 4X/week    Precautions / Restrictions Precautions Precautions: Fall    Pertinent Vitals/Pain 8/10 R sided head and neck pain, RN notified via call bell, pt repositioned to comfort in supine and states pain usually decreases with rest      Mobility  Bed Mobility Bed Mobility: Supine to Sit;Sit to Supine Supine to Sit: 6: Modified independent (Device/Increase time) Sit to Supine: 6: Modified independent (Device/Increase time) Details for Bed Mobility Assistance: increased time Transfers Transfers: Sit to Stand;Stand to Sit Sit to Stand: 4: Min guard;With upper extremity assist;From bed Stand to Sit: 4: Min guard;With upper extremity assist;To bed Details for Transfer Assistance: min/guard due to pt reports disequilibrium and neck and head pain are present and worst when standing and up walking Ambulation/Gait Ambulation/Gait Assistance: 3: Mod assist Ambulation Distance (Feet): 60 Feet Assistive device: 1 person hand held assist Ambulation/Gait Assistance Details: mostly min assist to steady however mod assist occasionally due to "feeling of leaning to R" disequilibrium sensation as well as increased head and neck pain 8/10 especially when starting to turn around and return to room Gait Pattern: Decreased trunk rotation;Narrow base of support;Step-through pattern;Decreased stride length;Lateral trunk lean to right Gait velocity: Decreased  General Gait Details: pt declined AD as well as another HHA, only wanted 1 HHA to steady, pt states today she is at her worst in regards to mobility    Exercises     PT Diagnosis: Difficulty walking;Acute pain (vertigo, ?cervicogenic dizziness)  PT Problem List: Decreased mobility;Decreased range of motion;Pain (motion sensitivity, vertigo) PT Treatment Interventions: DME instruction;Gait training;Functional mobility training;Therapeutic activities;Therapeutic exercise;Patient/family education;Neuromuscular re-education;Balance training (habituation exercises)     PT  Goals(Current goals can be found in the care plan  section) Acute Rehab PT Goals PT Goal Formulation: With patient Time For Goal Achievement: 03/27/13 Potential to Achieve Goals: Good  Visit Information  Last PT Received On: 03/13/13 Assistance Needed: +1 History of Present Illness: 52 y.o. female presenting with status migrainosis.  PMH is significant for difficult to control migraines and HTN.       Prior Functioning  Home Living Family/patient expects to be discharged to:: Private residence Living Arrangements: Children Available Help at Discharge: Family;Friend(s) Type of Home: House Home Access: Level entry Home Layout: One level Home Equipment: None Prior Function Level of Independence: Independent Communication Communication: No difficulties    Cognition  Cognition Arousal/Alertness: Awake/alert Behavior During Therapy: WFL for tasks assessed/performed Overall Cognitive Status: Within Functional Limits for tasks assessed    Extremity/Trunk Assessment Lower Extremity Assessment Lower Extremity Assessment: Overall WFL for tasks assessed Cervical / Trunk Assessment Cervical / Trunk Assessment: Other exceptions Cervical / Trunk Exceptions: limited AROM cervical flexion and extension as well as turning head L due to pain in R side of neck   Balance    End of Session PT - End of Session Equipment Utilized During Treatment: Gait belt Activity Tolerance: Patient limited by pain Patient left: in bed;with call bell/phone within reach Nurse Communication: Patient requests pain meds  GP     Kirsten Walsh,Kirsten Walsh 03/13/2013, 1:22 PM Zenovia Jarred, PT, DPT 03/13/2013 Pager: [phone]

## 2013-03-13 NOTE — Progress Notes (Signed)
Spoke with family medicine MD regarding the plan to manage pt's pain.  RN went into the room to discuss this plan, pt in tears claiming that she feels as if her pain is being ignored and that the orders throughout the day have taken much too long to be transcribed and followed through.  She reports that she has called her family in to document all events from here on out and that she will be documenting how long it takes for her requests to be acknowledged.  RN paged MD to obtain another order for IM Dilaudid and Benadryl.  Will continue to monitor and f/u with MD.

## 2013-03-13 NOTE — Progress Notes (Signed)
Attending Addendum  I have discussed the assessment and plan with Dr. Gayla Doss. I have reviewed the note and agree.    Dessa Phi, MD FAMILY MEDICINE TEACHING SERVICE

## 2013-03-13 NOTE — Progress Notes (Signed)
Family Medicine Teaching Service Daily Progress Note Intern Pager: [phone]  Patient name: Kirsten Walsh Medical record number: [id number] Date of birth: December 13, 1960 Age: 52 y.o. Gender: female  Primary Care Provider: [name], MD Consultants: None Code Status: Full  Pt Overview and Major Events to Date:   Assessment and Plan: KYNADEE DAM is a 52 y.o. female presenting with status migrainosis. PMH is significant for difficult to control migraines and HTN.   # Status migrainosus: This is a recurrent problem. Recent MRI in 01/2013 was normal. States that the headache is typical of her migraines. No focal neurologic deficits. Will sometimes respond well to DHE protocol. Current migraine is associated with vertigo which happens with some, but not all, of her migraines.  - continue home Topamax  - initial DHE protocol - reglan 10mg  IV, DHE 1mg  IV, Decadron 4mg  IV; repeat q8hrs  - zofran for associated nausea  - meclizine 12.5mg  BID to help with vertigo  - PICC placement  - PT to evaluate vestibular function   # Hypertension: Well controlled on admission.  - continue home clonidine patch   FEN/GI:  - clear liquid diet;   - MIVF Prophylaxis: SQ heparin   Disposition: admit to inpatient telemetry; discharge pending improving in headache and nausea  Subjective: 5/10 HA today with continued N w/o V. Complains of feeling off balance like she is leaning left.   Objective: Temp:  [98.1 F (36.7 C)-99.7 F (37.6 C)] 98.1 F (36.7 C) (09/23 0215) Pulse Rate:  [51-81] 58 (09/23 0215) Resp:  [16-18] 18 (09/23 0215) BP: (87-153)/(68-88) 147/72 mmHg (09/23 0215) SpO2:  [97 %-100 %] 97 % (09/23 0215) Weight:  [210 lb 1.6 oz (95.3 kg)] 210 lb 1.6 oz (95.3 kg) (09/22 1936) Physical Exam: General: alert, cooperative, NAD  HEENT: AT/[place], sclera white, EOMI, MMM, no LAD  Cardiovascular: RRR, no murmurs  Respiratory: CTAB, no wheezes or rales  Abdomen: +BS, soft, NTND   Extremities: Rt leg > Lt leg, no edema, good DP pulses  Laboratory:  Recent Labs Lab 03/12/13 1351 03/12/13 2250 03/13/13 0405  WBC 3.7* 6.2 6.5  HGB 12.9 15.0 15.5*  HCT 38.2 43.3 44.5  PLT 192 PLATELET CLUMPS NOTED ON SMEAR, COUNT APPEARS ADEQUATE 244    Recent Labs Lab 03/12/13 1351 03/12/13 2058 03/13/13 0405  NA 138  --  135  K 3.8  --  3.9  CL 107  --  103  CO2 25  --  19  BUN 9  --  7  CREATININE 1.00 0.81 0.88  CALCIUM 9.1  --  9.2  GLUCOSE 99  --  177*   Imaging/Diagnostic Tests: EKG: NSR, normal intervals, no ST or T-wave changes   Wenda Low, MD 03/13/2013, 9:29 AM PGY-1, [facility] FPTS Intern pager: [phone], text pages welcome

## 2013-03-13 NOTE — Progress Notes (Signed)
Called my nursing after patient's IV infiltrated this morning at approximately 11:30am. Patient had received her Decadron, DHE, and Reglan at 5am, but was due for medications soon and unable to get them due to no IV access. Discussed options with patient and prescribed  Norco for temporary pain relief until IV access could be reestablished. IV team was unable to get access. Options for treatment were discussed with patient: oral medication with sips, intranasal, suppository, and IM, and patient expressed desire wait and try to reestablish IV since she reported the other options had not worked for her in the past. PICC placement was pursued, but unsuccessful. Again other treatment routes were discussed with patient, who restated her preference for IV medication and requested central line placement. Risk and benefits of central-line were discussed, and patient was advised that we did not recommend central-line at this time due to high risk/benefit ratio. She continued to insist on Central-line placement, continued to refused other treatments, and Critical care consult was called concerning possibility of central line placement. Risk/benefits was discussed with Critical care specialists, and decision not to purse central line was made until other treatment options had failed as patients vital signs were stable. This decision was discussed with Ms Poppen who became very upset, but provided permission to try IM medications for migraine relief.

## 2013-03-13 NOTE — Progress Notes (Signed)
Pt was given 1 time dose of IM Dilaudid at 1845.  No new orders placed at this time, MD has been paged about the pain meds.  Pt and family are very upset, threatening to leave AMA and go to another facility.  MD on the floor at this time with family.  Night shift RN aware of the events of the day shift.  Chiropodist of the unit also aware of the events and has planned to speak with pt and family afterwards.

## 2013-03-13 NOTE — Progress Notes (Signed)
Dr. Gayla Doss saw patient at bedside. New orders received. Dr. Ermalinda Memos consulted for increase on IM dilaudid. Dose increased to 1.5mg  but frequency changed to every 6 hours.  All medications to be given IM. Patient aware and understands the need since IV access was unable to be obtained today. Will continue to monitor patient and voice patient needs to medical staff as they arise. Olevia Perches

## 2013-03-13 NOTE — Progress Notes (Signed)
UR complete.  Medora Roorda RN, MSN 

## 2013-03-13 NOTE — Progress Notes (Signed)
Consent obtained for PICC insertion.  Assessed both upper arms with ultrasound but veins are too small and deep to attempt cannulation for PICC.  Notified staff RN

## 2013-03-13 NOTE — Progress Notes (Signed)
Asked by FPTS to place central line in a patient that they were unable to place PICC line.  Access is only for treatment of a headache in a patient refusing IM injection.  The risk/benefit for placing central line for treatment of a headache is inappropriate and will not be placed.  If becomes emergent please contact PCCM.  Alyson Reedy, M.D. [facility] Pulmonary/Critical Care Medicine. Pager: [phone]. After hours pager: [phone].

## 2013-03-13 NOTE — Progress Notes (Signed)
Physical Therapy Treatment Patient Details Name: Kirsten Walsh MRN: [id number] DOB: May 29, 1961 Today's Date: 03/13/2013 Time: 4098-1191 PT Time Calculation (min): 14 min  PT Assessment / Plan / Recommendation  History of Present Illness 52 y.o. female presenting with status migrainosis.  PMH is significant for difficult to control migraines and HTN.   PT Comments   Pt performed cervical stretches in supine as pt unable to sit up this afternoon due to extreme nausea. Pt stated RN would be brining nausea medication soon. PT educated pt on gaze stabilization exercises, cervical stretches and shoulder exercises in order to decrease muscle tightness and decrease pain. PT discussed OPPT-neuro for balance, dizziness, and cervical/shoulder musculature evaluation, pt agreeable. Pt would continue to benefit from PT in order to improve functional mobility and safety.  Follow Up Recommendations  Outpatient PT;Supervision for mobility/OOB (outpatient neuro for balance, gaze stabilization (dizziness))     Does the patient have the potential to tolerate intense rehabilitation     Barriers to Discharge        Equipment Recommendations  Rolling walker with 5" wheels    Recommendations for Other Services    Frequency Min 4X/week   Progress towards PT Goals Progress towards PT goals: Progressing toward goals  Plan      Precautions / Restrictions Precautions Precautions: Fall   Pertinent Vitals/Pain Pt c/o of extreme nausea and could not sit up in bed, so pt performed exercises in supine.  Pt stated RN would be brining nausea medication soon.       Exercises Other Exercises Other Exercises: Pt performed bil upper trap stretch, levator scap stretch, and SCM stretch x2/bil with 20 sec. hold in supine, in order to decrease muscle tightness and pain. VC's for proper technique. Other Exercises: PT educated pt on gaze stabilization exercises, shoulder retraction, and shoulder circles, pt unable to  perform due to nausea.  Pt to perform all exercises in sitting and in pain free range.   PT Diagnosis: Difficulty walking;Acute pain (vertigo, ?cervicogenic dizziness)  PT Problem List: Decreased mobility;Decreased range of motion;Pain (motion sensitivity, vertigo) PT Treatment Interventions: DME instruction;Gait training;Functional mobility training;Therapeutic activities;Therapeutic exercise;Patient/family education;Neuromuscular re-education;Balance training (habituation exercises)   PT Goals (current goals can now be found in the care plan section) Acute Rehab PT Goals PT Goal Formulation: With patient Time For Goal Achievement: 03/27/13 Potential to Achieve Goals: Good  Visit Information  Last PT Received On: 03/13/13 Assistance Needed: +1 History of Present Illness: 52 y.o. female presenting with status migrainosis.  PMH is significant for difficult to control migraines and HTN.    Subjective Data      Cognition  Cognition Arousal/Alertness: Awake/alert Behavior During Therapy: WFL for tasks assessed/performed Overall Cognitive Status: Within Functional Limits for tasks assessed    Balance     End of Session PT - End of Session Equipment Utilized During Treatment: Gait belt Activity Tolerance: Other (comment) (nausea) Patient left: in bed;with call bell/phone within reach Nurse Communication: Patient requests pain meds   GP     [name] 03/13/2013, 4:29 PM

## 2013-03-13 NOTE — Progress Notes (Signed)
I have reviewed this note and agree with all findings. Kati Duan Scharnhorst, PT, DPT Pager: [phone]   

## 2013-03-14 DIAGNOSIS — G43711 Chronic migraine without aura, intractable, with status migrainosus: Principal | ICD-10-CM

## 2013-03-14 DIAGNOSIS — I10 Essential (primary) hypertension: Secondary | ICD-10-CM

## 2013-03-14 DIAGNOSIS — R42 Dizziness and giddiness: Secondary | ICD-10-CM

## 2013-03-14 DIAGNOSIS — M7989 Other specified soft tissue disorders: Secondary | ICD-10-CM

## 2013-03-14 DIAGNOSIS — G43901 Migraine, unspecified, not intractable, with status migrainosus: Secondary | ICD-10-CM

## 2013-03-14 MED ORDER — METOCLOPRAMIDE HCL 5 MG/ML IJ SOLN
10.0000 mg | Freq: Three times a day (TID) | INTRAMUSCULAR | Status: DC | PRN
  Filled 2013-03-14: qty 2

## 2013-03-14 MED ORDER — METOCLOPRAMIDE HCL 5 MG/ML IJ SOLN
10.0000 mg | Freq: Three times a day (TID) | INTRAMUSCULAR | Status: AC | PRN
  Administered 2013-03-14: 10 mg via INTRAVENOUS
  Filled 2013-03-14: qty 2

## 2013-03-14 MED ORDER — LORAZEPAM 1 MG PO TABS
1.0000 mg | ORAL_TABLET | Freq: Once | ORAL | Status: AC
  Administered 2013-03-14: 1 mg via ORAL
  Filled 2013-03-14: qty 1

## 2013-03-14 MED ORDER — DIPHENHYDRAMINE HCL 25 MG PO CAPS: 25.0000 mg | ORAL_CAPSULE | Freq: Three times a day (TID) | ORAL | Status: DC | PRN

## 2013-03-14 MED ORDER — LORAZEPAM 2 MG/ML IJ SOLN: 1.0000 mg | Freq: Once | INTRAMUSCULAR | Status: AC

## 2013-03-14 MED ORDER — DIPHENHYDRAMINE HCL 50 MG/ML IJ SOLN: 25.0000 mg | Freq: Three times a day (TID) | INTRAMUSCULAR | Status: DC | PRN

## 2013-03-14 MED ORDER — DIHYDROERGOTAMINE MESYLATE 1 MG/ML IJ SOLN
1.0000 mg | Freq: Three times a day (TID) | INTRAMUSCULAR | Status: DC | PRN
  Filled 2013-03-14 (×2): qty 1

## 2013-03-14 MED ORDER — HYDROCODONE-ACETAMINOPHEN 10-325 MG PO TABS
1.0000 | ORAL_TABLET | Freq: Once | ORAL | Status: AC
  Administered 2013-03-14: 1 via ORAL
  Filled 2013-03-14: qty 1

## 2013-03-14 MED ORDER — HYDROMORPHONE HCL PF 1 MG/ML IJ SOLN
1.0000 mg | INTRAMUSCULAR | Status: DC | PRN
  Administered 2013-03-14 – 2013-03-15 (×3): 1 mg via INTRAVENOUS
  Filled 2013-03-14 (×3): qty 1

## 2013-03-14 MED ORDER — HYDROCODONE-ACETAMINOPHEN 10-325 MG PO TABS
1.0000 | ORAL_TABLET | ORAL | Status: AC
  Administered 2013-03-14: 1 via ORAL
  Filled 2013-03-14: qty 1

## 2013-03-14 MED ORDER — LORAZEPAM 2 MG/ML PO CONC
2.0000 mg | ORAL | Status: AC
  Administered 2013-03-14: 2 mg via ORAL
  Filled 2013-03-14 (×2): qty 1

## 2013-03-14 MED ORDER — DEXAMETHASONE SODIUM PHOSPHATE 4 MG/ML IJ SOLN
4.0000 mg | Freq: Two times a day (BID) | INTRAMUSCULAR | Status: DC
  Administered 2013-03-14 – 2013-03-16 (×4): 4 mg via INTRAVENOUS
  Filled 2013-03-14 (×4): qty 1

## 2013-03-14 MED ORDER — DIHYDROERGOTAMINE MESYLATE 1 MG/ML IJ SOLN
1.0000 mg | Freq: Three times a day (TID) | INTRAMUSCULAR | Status: DC | PRN
  Filled 2013-03-14: qty 1

## 2013-03-14 MED ORDER — ZOLPIDEM TARTRATE 5 MG PO TABS
5.0000 mg | ORAL_TABLET | Freq: Once | ORAL | Status: AC
  Administered 2013-03-14: 5 mg via ORAL
  Filled 2013-03-14: qty 1

## 2013-03-14 NOTE — Progress Notes (Signed)
Page sent to Dr. Waynetta Sandy in regards to patient requesting something to help her sleep at 0135. Awaiting response/new order. Olevia Perches

## 2013-03-14 NOTE — Progress Notes (Signed)
I have reviewed this note and agree with all findings. Kati Petr Bontempo, PT, DPT Pager: [phone]   

## 2013-03-14 NOTE — Progress Notes (Signed)
PT Cancellation Note  Patient Details Name: Kirsten Walsh MRN: [id number] DOB: [birth year]   Cancelled Treatment:    Reason Eval/Treat Not Completed: Fatigue/lethargy limiting ability to participate. Pt reports she had just received sleeping medication and was too tired to participate in PT today. PT will check back tomorrow.   [name] 03/14/2013, 4:34 PM

## 2013-03-14 NOTE — Consult Note (Signed)
NEURO HOSPITALIST CONSULT NOTE    Reason for Consult: 5 day history of migraine HA  HPI:                                                                                                                                          Kirsten Walsh is an 52 y.o. female who has suffered intractable HA for the last ten years.  On a daily basis her HA are a 4-5/10.  Five days ago she was out with a friend when she had a syncopal episode.  This episode had no postictal, jerking, incontinence and she was able to stand up and continue to walk. After he fall she did not her HA was significantly worse.  It started out bilateral frontal but over the past 5 days she has noted pain in her right neck, trapezius, shoulder, arm and leg.  Patient was brought to ED where she was admitted. Due to patient recently being admitted 2 weeks ago and receiving DHE, teaching service was going to repeat the same.  At present time a IV cannot be placed due to trouble finding access.  While in the hospital she states her HA was improving but due to becoming upset today her HA has increased.  Currently it is a 8/10, throbbing and "feels like a ice pick is in her right ear". She is sitting up and comfortable at the side of the bed.    She has seen Dr. Meriel Flavors in the past (4 years ago) and would like to return to him.    She has tried the following with initial good relief but now has no relief when given these medications.  Botox, Zonegram, Topamax (currently on)Imetrex, Depakote, Percocet, Vicodin, Nortriptyline, Skelaxin.   Past Medical History  Diagnosis Date  . Hypertension   . Chest pain   . Gall stones   . Migraine     "qd" (02/14/2013)  . Chronic lower back pain     "right side to mid back" (02/14/2013)  . Kidney stone ~ 2006    Past Surgical History  Procedure Laterality Date  . Laparoscopic cholecystectomy  ~ 2004  . Cystoscopy w/ stone manipulation  ~ 2006  . Intrauterine device insertion       "initially put in in 04/2002; changed prn" (02/14/2013)    Family History  Problem Relation Age of Onset  . Cancer Mother   . Cirrhosis Father   Mother, son and sister with migraines  Social History:  reports that she has never smoked. She has never used smokeless tobacco. She reports that  drinks alcohol. She reports that she does not use illicit drugs.  Allergies  Allergen Reactions  . Apple Anaphylaxis  . Aspirin Anaphylaxis  . Carrot [Daucus Carota] Anaphylaxis  MEDICATIONS:                                                                                                                     Scheduled: . cloNIDine  0.1 mg Transdermal Weekly  . dexamethasone  4 mg Intramuscular Q12H  . diphenhydrAMINE  25 mg Intramuscular Q8H   Or  . diphenhydrAMINE  25 mg Oral Q8H  . heparin  5,000 Units Subcutaneous Q8H  . meclizine  12.5 mg Oral BID  . metoCLOPramide (REGLAN) injection  10 mg Intramuscular Q8H  . senna  1 tablet Oral BID  . topiramate  100 mg Oral BID     ROS:                                                                                                                                       History obtained from the patient  General ROS: negative for - chills, fatigue, fever, night sweats, weight gain or weight loss Psychological ROS: negative for - behavioral disorder, hallucinations, memory difficulties, mood swings or suicidal ideation Ophthalmic ROS: negative for - blurry vision, double vision, eye pain or loss of vision ENT ROS: negative for - epistaxis, nasal discharge, oral lesions, sore throat, tinnitus or vertigo Allergy and Immunology ROS: negative for - hives or itchy/watery eyes Hematological and Lymphatic ROS: negative for - bleeding problems, bruising or swollen lymph nodes Endocrine ROS: negative for - galactorrhea, hair pattern changes, polydipsia/polyuria or temperature intolerance Respiratory ROS: negative for - cough, hemoptysis, shortness of  breath or wheezing Cardiovascular ROS: negative for - chest pain, dyspnea on exertion, edema or irregular heartbeat Gastrointestinal ROS: negative for - abdominal pain, diarrhea, hematemesis, nausea/vomiting or stool incontinence Genito-Urinary ROS: negative for - dysuria, hematuria, incontinence or urinary frequency/urgency Musculoskeletal ROS: negative for - joint swelling or muscular weakness Neurological ROS: as noted in HPI Dermatological ROS: negative for rash and skin lesion changes   Blood pressure 150/74, pulse 47, temperature 98.7 F (37.1 C), temperature source Oral, resp. rate 16, height 5\' 5"  (1.651 m), weight 98.385 kg (216 lb 14.4 oz), SpO2 100.00%.   Neurologic Examination:  Mental Status: Alert, oriented, thought content appropriate.  Speech fluent without evidence of aphasia.  Able to follow 3 step commands without difficulty. Cranial Nerves: II: Discs flat bilaterally; Visual fields grossly normal, pupils equal, round, reactive to light and accommodation III,IV, VI: ptosis not present, extra-ocular motions intact bilaterally V,VII: smile symmetric, facial light touch sensation normal bilaterally VIII: hearing normal bilaterally IX,X: gag reflex present XI: bilateral shoulder shrug XII: midline tongue extension Motor: Right : Upper extremity   4/5 (pain related)   Left:     Upper extremity   5/5  Lower extremity   4/5 (pain related)    Lower extremity   5/5 Tone and bulk:normal tone throughout; no atrophy noted Sensory: Pinprick and light touch intact throughout, bilaterally Deep Tendon Reflexes:  Right: Upper Extremity   Left: Upper extremity   biceps (C-5 to C-6) 2/4   biceps (C-5 to C-6) 2/4 tricep (C7) 2/4    triceps (C7) 2/4 Brachioradialis (C6) 2/4  Brachioradialis (C6) 2/4  Lower Extremity Lower Extremity  quadriceps (L-2 to L-4) 2/4   quadriceps (L-2 to  L-4) 2/4 Achilles (S1) 2/4   Achilles (S1) 2/4  Plantars: Right: downgoing   Left: downgoing Cerebellar: normal finger-to-nose,  normal heel-to-shin test Gait: normal CV: pulses palpable throughout    Lab Results  Component Value Date/Time   CHOL 171 07/16/2008  8:41 PM    Results for orders placed during the hospital encounter of 03/12/13 (from the past 48 hour(s))  CBC     Status: Abnormal   Collection Time    03/12/13  1:51 PM      Result Value Range   WBC 3.7 (*) 4.0 - 10.5 K/uL   RBC 4.09  3.87 - 5.11 MIL/uL   Hemoglobin 12.9  12.0 - 15.0 g/dL   HCT 16.1  09.6 - 04.5 %   MCV 93.4  78.0 - 100.0 fL   MCH 31.5  26.0 - 34.0 pg   MCHC 33.8  30.0 - 36.0 g/dL   RDW 40.9  81.1 - 91.4 %   Platelets 192  150 - 400 K/uL  BASIC METABOLIC PANEL     Status: Abnormal   Collection Time    03/12/13  1:51 PM      Result Value Range   Sodium 138  135 - 145 mEq/L   Potassium 3.8  3.5 - 5.1 mEq/L   Chloride 107  96 - 112 mEq/L   CO2 25  19 - 32 mEq/L   Glucose, Bld 99  70 - 99 mg/dL   BUN 9  6 - 23 mg/dL   Creatinine, Ser 7.82  0.50 - 1.10 mg/dL   Calcium 9.1  8.4 - 95.6 mg/dL   GFR calc non Af Amer 65 (*) >90 mL/min   GFR calc Af Amer 75 (*) >90 mL/min   Comment: (NOTE)     The eGFR has been calculated using the CKD EPI equation.     This calculation has not been validated in all clinical situations.     eGFR's persistently <90 mL/min signify possible Chronic Kidney     Disease.  CREATININE, SERUM     Status: Abnormal   Collection Time    03/12/13  8:58 PM      Result Value Range   Creatinine, Ser 0.81  0.50 - 1.10 mg/dL   GFR calc non Af Amer 84 (*) >90 mL/min   GFR calc Af Amer >90  >90 mL/min   Comment: (NOTE)  The eGFR has been calculated using the CKD EPI equation.     This calculation has not been validated in all clinical situations.     eGFR's persistently <90 mL/min signify possible Chronic Kidney     Disease.  CBC     Status: None   Collection Time     03/12/13 10:50 PM      Result Value Range   WBC 6.2  4.0 - 10.5 K/uL   Comment: WHITE COUNT CONFIRMED ON SMEAR   RBC 4.73  3.87 - 5.11 MIL/uL   Hemoglobin 15.0  12.0 - 15.0 g/dL   HCT 16.1  09.6 - 04.5 %   MCV 91.5  78.0 - 100.0 fL   MCH 31.7  26.0 - 34.0 pg   MCHC 34.6  30.0 - 36.0 g/dL   RDW 40.9  81.1 - 91.4 %   Platelets    150 - 400 K/uL   Value: PLATELET CLUMPS NOTED ON SMEAR, COUNT APPEARS ADEQUATE  URINE RAPID DRUG SCREEN (HOSP PERFORMED)     Status: None   Collection Time    03/13/13  3:12 AM      Result Value Range   Opiates NONE DETECTED  NONE DETECTED   Cocaine NONE DETECTED  NONE DETECTED   Benzodiazepines NONE DETECTED  NONE DETECTED   Amphetamines NONE DETECTED  NONE DETECTED   Tetrahydrocannabinol NONE DETECTED  NONE DETECTED   Barbiturates NONE DETECTED  NONE DETECTED   Comment:            DRUG SCREEN FOR MEDICAL PURPOSES     ONLY.  IF CONFIRMATION IS NEEDED     FOR ANY PURPOSE, NOTIFY LAB     WITHIN 5 DAYS.                LOWEST DETECTABLE LIMITS     FOR URINE DRUG SCREEN     Drug Class       Cutoff (ng/mL)     Amphetamine      1000     Barbiturate      200     Benzodiazepine   200     Tricyclics       300     Opiates          300     Cocaine          300     THC              50  BASIC METABOLIC PANEL     Status: Abnormal   Collection Time    03/13/13  4:05 AM      Result Value Range   Sodium 135  135 - 145 mEq/L   Potassium 3.9  3.5 - 5.1 mEq/L   Chloride 103  96 - 112 mEq/L   CO2 19  19 - 32 mEq/L   Glucose, Bld 177 (*) 70 - 99 mg/dL   BUN 7  6 - 23 mg/dL   Creatinine, Ser 7.82  0.50 - 1.10 mg/dL   Calcium 9.2  8.4 - 95.6 mg/dL   GFR calc non Af Amer 76 (*) >90 mL/min   GFR calc Af Amer 88 (*) >90 mL/min   Comment: (NOTE)     The eGFR has been calculated using the CKD EPI equation.     This calculation has not been validated in all clinical situations.     eGFR's persistently <90 mL/min signify possible Chronic Kidney     Disease.  CBC  Status: Abnormal   Collection Time    03/13/13  4:05 AM      Result Value Range   WBC 6.5  4.0 - 10.5 K/uL   RBC 4.87  3.87 - 5.11 MIL/uL   Hemoglobin 15.5 (*) 12.0 - 15.0 g/dL   HCT 16.1  09.6 - 04.5 %   MCV 91.4  78.0 - 100.0 fL   MCH 31.8  26.0 - 34.0 pg   MCHC 34.8  30.0 - 36.0 g/dL   RDW 40.9  81.1 - 91.4 %   Platelets 244  150 - 400 K/uL    No results found.   Felicie Morn PA-C [facility] [phone]  03/14/2013, 1:12 PM   Patient seen and examined.  Clinical course and management discussed.  Necessary edits performed.  I agree with the above.  Assessment and plan of care developed and discussed below.     Assessment/Plan: 52 year old female with a long history of migraines and syncope.  Had a recent syncopal episode leading to current hospitalization and worsening of headache.  Has not been able to get reliable IV access.  This has been addressed recently and an IV is in place.  This increases our options for abortive therapy.  Would also like to consider additional medication for prophylactic treatment.  Concerned about the syncopal episodes as well.  Although the patient may have basilar migraines would also like to rule out basilar spasm or stenosis causing the syncope as well.  MRI of the brain performed on 8/30 has been reviewed and is unremarkable.  Would like to better visualize her vascular system as well.  Recommendations: 1.  Would resume DHE protocol but not make prn.  Would schedule DHE and Reglan every 8 hours 2.  Patient has had results from TCA's in the past.  Would consider Protriptyline at 5mg  daily initial dose with plans for increase as necessary.  This increase if necessary can be performed on an outpatient basis.   3.  CTA of the head for full evaluation of the vertebrobasilar system.    Thana Farr, MD [facility] [phone]  03/14/2013  6:28 PM

## 2013-03-14 NOTE — Progress Notes (Signed)
Attending Addendum  I examined the patient and discussed the assessment and plan with Dr. Gayla Doss. I have reviewed the note and agree. The patient had a bad night last night and has no IV access. She is slightly better today compared to yesterday. I agree with neurology consult and will follow up recommendations. For now will order repeat Vicodin 10-325 mg x one with 2 mg of ativan solution. Team to follow up response with RN. Please minimize disturbances as much as possible.     Dessa Phi, MD FAMILY MEDICINE TEACHING SERVICE

## 2013-03-14 NOTE — Progress Notes (Signed)
IV access- 22g  R wrist placed.

## 2013-03-14 NOTE — Progress Notes (Signed)
Family Medicine Teaching Service Daily Progress Note Intern Pager: [phone]  Patient name: Kirsten Walsh Medical record number: [id number] Date of birth: 03-12-1961 Age: 52 y.o. Gender: female  Primary Care Provider: [name], MD Consultants: None Code Status: Full  Pt Overview and Major Events to Date:   Assessment and Plan: Kirsten Walsh is a 52 y.o. female presenting with status migrainosis. PMH is significant for difficult to control migraines and HTN.   # Status migrainosus: This is a recurrent problem. Recent MRI in 01/2013 was normal. States that the headache is typical of her migraines. No focal neurologic deficits. Will sometimes respond well to DHE protocol. Current migraine is associated with vertigo which happens with some, but not all, of her migraines.  - continue home Topamax PO - DHE protocol - reglan 10 mg IM, benadryl and DHE 1mg  IM repeat q8hrs; Decadron 4mg  IM q12; dilaudid 1mg  q4rh prn  - zofran PO for associated nausea  - meclizine 12.5mg  BID to help with vertigo  - Will consider re attempting to gain IV access this afternoon depending on PO intake - PT evaluate vestibular function: Recs outpt neuro PT and Rolling walker (pt refused); continuing to follow   # Hypertension: Well controlled on admission.  - BP last 24: 112-158/59-67 - continue home clonidine patch   FEN/GI:  - clear liquid diet;   - No IV access Prophylaxis: SQ heparin   Disposition: inpatient telemetry; discharge pending improving in headache and nausea  Subjective: 8/10 HA today with some nausea with fluids and foods. Still endorses feeling balance like she is leaning left when she walks. Asking to take a shower today. Able to take some medications by mouth, but not wanting to increases the number of pills at this time, so requesting that some medications continue as IM.   Objective: Temp:  [98.1 F (36.7 C)-98.8 F (37.1 C)] 98.1 F (36.7 C) (09/24 0522) Pulse Rate:   [58-66] 58 (09/24 0522) Resp:  [16-18] 16 (09/24 0522) BP: (112-158)/(59-87) 112/59 mmHg (09/24 0522) SpO2:  [95 %-100 %] 100 % (09/24 0522) Weight:  [216 lb 14.4 oz (98.385 kg)] 216 lb 14.4 oz (98.385 kg) (09/24 1308) Physical Exam: General: alert, cooperative, NAD  HEENT: AT/[place], sclera white, EOMI, Mucus Membranes slightly dry Cardiovascular: RRR, no murmurs  Respiratory: CTAB, no wheezes or rales  Abdomen: +BS, soft, NTND  Extremities: Rt leg > Lt leg, + 1 edema in Rt leg to knee, good DP pulses  Laboratory:  Recent Labs Lab 03/12/13 1351 03/12/13 2250 03/13/13 0405  WBC 3.7* 6.2 6.5  HGB 12.9 15.0 15.5*  HCT 38.2 43.3 44.5  PLT 192 PLATELET CLUMPS NOTED ON SMEAR, COUNT APPEARS ADEQUATE 244    Recent Labs Lab 03/12/13 1351 03/12/13 2058 03/13/13 0405  NA 138  --  135  K 3.8  --  3.9  CL 107  --  103  CO2 25  --  19  BUN 9  --  7  CREATININE 1.00 0.81 0.88  CALCIUM 9.1  --  9.2  GLUCOSE 99  --  177*   Imaging/Diagnostic Tests: EKG: NSR, normal intervals, no ST or T-wave changes   Wenda Low, MD 03/14/2013, 9:32 AM PGY-1, [facility] FPTS Intern pager: [phone], text pages welcome

## 2013-03-14 NOTE — ED Provider Notes (Signed)
Medical screening examination/treatment/procedure(s) were performed by non-physician practitioner and as supervising physician I was immediately available for consultation/collaboration.  Eaven Schwager, MD 03/14/13 1504 

## 2013-03-15 ENCOUNTER — Inpatient Hospital Stay (HOSPITAL_COMMUNITY): Payer: Medicaid Other

## 2013-03-15 LAB — BASIC METABOLIC PANEL
BUN: 6 mg/dL (ref 6–23)
CO2: 16 mEq/L — ABNORMAL LOW (ref 19–32)
Chloride: 106 mEq/L (ref 96–112)
GFR calc Af Amer: 81 mL/min — ABNORMAL LOW (ref >90)
GFR calc non Af Amer: 70 mL/min — ABNORMAL LOW (ref >90)
Glucose, Bld: 158 mg/dL — ABNORMAL HIGH (ref 70–99)
Potassium: 3.6 mEq/L (ref 3.5–5.1)
Sodium: 134 mEq/L — ABNORMAL LOW (ref 135–145)

## 2013-03-15 LAB — CBC
HCT: 41.8 % (ref 36.0–46.0)
Hemoglobin: 14.7 g/dL (ref 12.0–15.0)
MCH: 31.9 pg (ref 26.0–34.0)
MCHC: 35.2 g/dL (ref 30.0–36.0)
MCV: 90.7 fL (ref 78.0–100.0)
RBC: 4.61 MIL/uL (ref 3.87–5.11)
WBC: 10.2 10*3/uL (ref 4.0–10.5)

## 2013-03-15 MED ORDER — DIHYDROERGOTAMINE MESYLATE 1 MG/ML IJ SOLN
1.0000 mg | Freq: Three times a day (TID) | INTRAMUSCULAR | Status: DC
  Administered 2013-03-15 – 2013-03-16 (×3): 1 mg via INTRAVENOUS
  Filled 2013-03-15 (×8): qty 1

## 2013-03-15 MED ORDER — IOHEXOL 350 MG/ML SOLN
80.0000 mL | Freq: Once | INTRAVENOUS | Status: AC | PRN
  Administered 2013-03-15: 80 mL via INTRAVENOUS

## 2013-03-15 MED ORDER — PROTRIPTYLINE HCL 5 MG PO TABS
5.0000 mg | ORAL_TABLET | Freq: Every day | ORAL | Status: DC
  Filled 2013-03-15: qty 1

## 2013-03-15 MED ORDER — SODIUM CHLORIDE 0.9 % IV BOLUS (SEPSIS)
500.0000 mL | Freq: Once | INTRAVENOUS | Status: AC
  Administered 2013-03-15: 500 mL via INTRAVENOUS

## 2013-03-15 MED ORDER — DIPHENHYDRAMINE HCL 25 MG PO CAPS
25.0000 mg | ORAL_CAPSULE | Freq: Three times a day (TID) | ORAL | Status: DC
  Administered 2013-03-15 – 2013-03-16 (×4): 25 mg via ORAL
  Filled 2013-03-15 (×5): qty 1

## 2013-03-15 MED ORDER — NORTRIPTYLINE HCL 25 MG PO CAPS
25.0000 mg | ORAL_CAPSULE | Freq: Every day | ORAL | Status: DC
  Administered 2013-03-15: 25 mg via ORAL
  Filled 2013-03-15 (×2): qty 1

## 2013-03-15 MED ORDER — KCL IN DEXTROSE-NACL 40-5-0.45 MEQ/L-%-% IV SOLN
INTRAVENOUS | Status: DC
  Administered 2013-03-15 – 2013-03-16 (×3): via INTRAVENOUS
  Filled 2013-03-15 (×8): qty 1000

## 2013-03-15 NOTE — Progress Notes (Signed)
Subjective: Patient continues to have a headache.  Rates her pain at a 8/10.  Also reports that she remains nauseous and is unable to eat or drink much.    Objective: Current vital signs: BP 156/92  Pulse 88  Temp(Src) 98.4 F (36.9 C) (Oral)  Resp 18  Ht 5\' 5"  (1.651 m)  Wt 98.113 kg (216 lb 4.8 oz)  BMI 35.99 kg/m2  SpO2 96% Vital signs in last 24 hours: Temp:  [98.3 F (36.8 C)-98.5 F (36.9 C)] 98.4 F (36.9 C) (09/25 0453) Pulse Rate:  [61-88] 88 (09/25 0453) Resp:  [16-18] 18 (09/25 0453) BP: (138-156)/(81-94) 156/92 mmHg (09/25 0453) SpO2:  [93 %-99 %] 96 % (09/25 0453) Weight:  [98.113 kg (216 lb 4.8 oz)] 98.113 kg (216 lb 4.8 oz) (09/25 0500)  Intake/Output from previous day: 09/24 0701 - 09/25 0700 In: 240 [P.O.:240] Out: -  Intake/Output this shift:   Nutritional status: Clear Liquid  Neurologic Exam: Mental Status:  Alert, oriented, thought content appropriate. Speech fluent without evidence of aphasia. Able to follow 3 step commands without difficulty.  Cranial Nerves:  II: Discs flat bilaterally; Visual fields grossly normal, pupils equal, round, reactive to light and accommodation  III,IV, VI: ptosis not present, extra-ocular motions intact bilaterally  V,VII: smile symmetric, facial light touch sensation normal bilaterally  VIII: hearing normal bilaterally  IX,X: gag reflex present  XI: bilateral shoulder shrug  XII: midline tongue extension  Motor:  Right : Upper extremity 4/5 (pain related)          Left: Upper extremity 5/5   Lower extremity 4/5 (pain related)       Lower extremity 5/5  Tone and bulk:normal tone throughout; no atrophy noted  Sensory: Pinprick and light touch intact throughout, bilaterally  Deep Tendon Reflexes:  Right: Upper Extremity       Left: Upper extremity   biceps (C-5 to C-6) 2/4       biceps (C-5 to C-6) 2/4   tricep (C7) 2/4        triceps (C7) 2/4   Brachioradialis (C6) 2/4       Brachioradialis (C6) 2/4  Lower  Extremity        Lower Extremity   quadriceps (L-2 to L-4) 2/4       quadriceps (L-2 to L-4) 2/4   Achilles (S1) 2/4        Achilles (S1) 2/4  Plantars:  Right: downgoing     Left: downgoing   Lab Results: Basic Metabolic Panel:  Recent Labs Lab 03/12/13 1351 03/12/13 2058 03/13/13 0405  NA 138  --  135  K 3.8  --  3.9  CL 107  --  103  CO2 25  --  19  GLUCOSE 99  --  177*  BUN 9  --  7  CREATININE 1.00 0.81 0.88  CALCIUM 9.1  --  9.2    Liver Function Tests: No results found for this basename: AST, ALT, ALKPHOS, BILITOT, PROT, ALBUMIN,  in the last 168 hours No results found for this basename: LIPASE, AMYLASE,  in the last 168 hours No results found for this basename: AMMONIA,  in the last 168 hours  CBC:  Recent Labs Lab 03/12/13 1351 03/12/13 2250 03/13/13 0405  WBC 3.7* 6.2 6.5  HGB 12.9 15.0 15.5*  HCT 38.2 43.3 44.5  MCV 93.4 91.5 91.4  PLT 192 PLATELET CLUMPS NOTED ON SMEAR, COUNT APPEARS ADEQUATE 244    Cardiac Enzymes: No results found for  this basename: CKTOTAL, CKMB, CKMBINDEX, TROPONINI,  in the last 168 hours  Lipid Panel: No results found for this basename: CHOL, TRIG, HDL, CHOLHDL, VLDL, LDLCALC,  in the last 168 hours  CBG: No results found for this basename: GLUCAP,  in the last 168 hours  Microbiology: Results for orders placed during the hospital encounter of 02/13/13  WET PREP, GENITAL     Status: Abnormal   Collection Time    02/14/13 10:05 PM      Result Value Range Status   Yeast Wet Prep HPF POC NONE SEEN  NONE SEEN Final   Trich, Wet Prep MANY (*) NONE SEEN Final   Clue Cells Wet Prep HPF POC FEW (*) NONE SEEN Final   WBC, Wet Prep HPF POC TOO NUMEROUS TO COUNT (*) NONE SEEN Final    Coagulation Studies: No results found for this basename: LABPROT, INR,  in the last 72 hours  Imaging: No results found.  Medications:  I have reviewed the patient's current medications. Scheduled: . cloNIDine  0.1 mg Transdermal Weekly  .  dexamethasone  4 mg Intravenous Q12H  . heparin  5,000 Units Subcutaneous Q8H  . meclizine  12.5 mg Oral BID  . senna  1 tablet Oral BID  . topiramate  100 mg Oral BID    Assessment/Plan: Patient's headaches remain severe.  Rated at a 8/10.  Has not had any DHE since the 23rd.  Remains nauseous as well.  CTA pending.    Recommendations: 1.  Would consider alternating Zofran with DHE/Reglan 2.  Will follow up results of CTA   LOS: 3 days   Thana Farr, MD [facility] [phone] 03/15/2013  11:23 AM

## 2013-03-15 NOTE — Progress Notes (Addendum)
PT Cancellation Note  Patient Details Name: Kirsten Walsh MRN: [id number] DOB: [birth year]   Cancelled Treatment:    Reason Eval/Treat Not Completed: Patient at procedure or test/unavailable: CT/angiogram   Vanilla Heatherington 03/15/2013, 2:41 PM Pager [phone]  14:55 Noted after this entry that order for PT had been discontinued by Dr. Armen Pickup. Spoke with Dr. Gayla Doss re: need to re-order PT for follow-up re: cervicogenic dizziness and stretches/exercises given. He agreed to re-order PT for [place] to perform at least one follow-up visit and determine at that time if any further PT is indicated.    03/15/2013 Veda Canning, PT Pager: [phone]

## 2013-03-15 NOTE — Procedures (Signed)
Successful placement of right basilic vein approach 40 cm dual lumen PICC line with tip at the superior caval-atrial junction.  The PICC line is ready for immediate use. 

## 2013-03-15 NOTE — Progress Notes (Signed)
Family Medicine Teaching Service Daily Progress Note Intern Pager: [phone]  Patient name: Kirsten Walsh Medical record number: [id number] Date of birth: 09-30-1960 Age: 52 y.o. Gender: female  Primary Care Provider: [name], MD Consultants: None Code Status: Full  Pt Overview and Major Events to Date:   Assessment and Plan: Kirsten Walsh is a 52 y.o. female presenting with status migrainosis. PMH is significant for difficult to control migraines and HTN.   # Status migrainosus: This is a recurrent problem. Recent MRI in 01/2013 was normal. States that the headache is typical of her migraines. No focal neurologic deficits. Will sometimes respond well to DHE protocol. Current migraine is associated with vertigo which happens with some, but not all, of her migraines.  - continue home Topamax PO - DHE protocol: benadryl 25mg  po and DHE 1mg  IV repeat q8hrs; Decadron 4mg  IV q12; dilaudid stopped  - zofran PO for associated nausea  - meclizine 12.5mg  BID to help with vertigo  - Gained IV access 9/24 @ 6:30; Switched meds to IV - PT evaluate vestibular function: Recs outpt neuro PT and Rolling walker (pt refused); continuing to follow  - Neurology c/s: Protriptyline 5mg  qd started 9/25; Will attempt PICC placement and CTA head  # Hypertension: Well controlled on admission.  - BP last 24: 112-158/59-67 - continue home clonidine patch   FEN/GI:  - clear liquid diet;   - MIVF; + bolus 500cc NS x 2 Prophylaxis: SQ heparin   Disposition: inpatient telemetry; discharge pending improving in headache and nausea  Subjective: 8/10 HA today with some nausea with fluids and foods. Still endorses feeling balance like she is leaning left when she walks. Asking to take a shower today. Able to take some medications by mouth, but not wanting to increases the number of pills at this time, so requesting that some medications continue as IM.   Objective: Temp:  [98.3 F (36.8 C)-98.7 F  (37.1 C)] 98.4 F (36.9 C) (09/25 0453) Pulse Rate:  [47-88] 88 (09/25 0453) Resp:  [16-18] 18 (09/25 0453) BP: (138-156)/(74-94) 156/92 mmHg (09/25 0453) SpO2:  [93 %-100 %] 96 % (09/25 0453) Weight:  [216 lb 4.8 oz (98.113 kg)] 216 lb 4.8 oz (98.113 kg) (09/25 0500) Physical Exam: General: alert, cooperative, NAD; sitting up in chair with lights and TV on HEENT: AT/[place], sclera white, EOMI, Mucus Membranes slightly dry Cardiovascular: RRR, no murmurs  Respiratory: CTAB, no wheezes or rales  Abdomen: +BS, soft, NTND  Extremities: Rt leg > Lt leg, + 1 edema in Rt leg to knee, good DP pulses  Laboratory:  Recent Labs Lab 03/12/13 1351 03/12/13 2250 03/13/13 0405  WBC 3.7* 6.2 6.5  HGB 12.9 15.0 15.5*  HCT 38.2 43.3 44.5  PLT 192 PLATELET CLUMPS NOTED ON SMEAR, COUNT APPEARS ADEQUATE 244    Recent Labs Lab 03/12/13 1351 03/12/13 2058 03/13/13 0405  NA 138  --  135  K 3.8  --  3.9  CL 107  --  103  CO2 25  --  19  BUN 9  --  7  CREATININE 1.00 0.81 0.88  CALCIUM 9.1  --  9.2  GLUCOSE 99  --  177*   Imaging/Diagnostic Tests: EKG: NSR, normal intervals, no ST or T-wave changes  Wenda Low, MD 03/15/2013, 9:47 AM PGY-1, [facility] FPTS Intern pager: [phone], text pages welcome

## 2013-03-15 NOTE — Progress Notes (Signed)
Attending Addendum  I examined the patient and discussed the assessment and plan with Dr. Gayla Doss. I have reviewed the note and agree.  The patient reports that her pain is 7-8/10 today. She reports persistent nausea but is willing to try a regular diet today. We discussed in detail the plan for today which is to discontinued a lot as this as it is not helped with her pain. I appreciate neurology recommendations agree with that given DHE with Phenergan every 8 hours with Zofran and a 4 hour interval in between is a good treatment plan for today. The patient expresses she is willing to try anything that will control her pain. Her major concern is to not let her pain gets any worse. We discussed about her Imitrex as she reported that she took it daily for about a week and a half but has not taken it many weeks. We discussed that this was not the appropriate way to take triptans. We discussed the appropriate dosing in detail.  Patient is currently receiving IV fluid bolus in anticipation of PICC line placement which is the place that she may receive IV contrast for CT angiogram of her head. She is aware of all this and amenable to the plan.  Of note she reports that Reglan works better for her than Zofran. However we will try Zofran today.    Dessa Phi, MD FAMILY MEDICINE TEACHING SERVICE

## 2013-03-16 DIAGNOSIS — G589 Mononeuropathy, unspecified: Secondary | ICD-10-CM

## 2013-03-16 DIAGNOSIS — E669 Obesity, unspecified: Secondary | ICD-10-CM

## 2013-03-16 DIAGNOSIS — G8929 Other chronic pain: Secondary | ICD-10-CM

## 2013-03-16 MED ORDER — PROMETHAZINE HCL 25 MG RE SUPP: 25.0000 mg | Freq: Four times a day (QID) | RECTAL | Status: DC | PRN

## 2013-03-16 MED ORDER — NORTRIPTYLINE HCL 25 MG PO CAPS
50.0000 mg | ORAL_CAPSULE | Freq: Every day | ORAL | Status: DC
Start: 2013-03-16 — End: 2013-03-16
  Filled 2013-03-16: qty 2

## 2013-03-16 MED ORDER — SUMATRIPTAN SUCCINATE 50 MG PO TABS: 50.0000 mg | ORAL_TABLET | ORAL | Status: DC

## 2013-03-16 MED ORDER — DIHYDROERGOTAMINE MESYLATE 4 MG/ML NA SOLN: 1.0000 | NASAL | Status: DC

## 2013-03-16 MED ORDER — PROPRANOLOL HCL 20 MG PO TABS
20.0000 mg | ORAL_TABLET | Freq: Two times a day (BID) | ORAL | Status: DC
  Administered 2013-03-16: 20 mg via ORAL
  Filled 2013-03-16 (×3): qty 1

## 2013-03-16 MED ORDER — HYDROMORPHONE HCL PF 1 MG/ML IJ SOLN
1.0000 mg | Freq: Once | INTRAMUSCULAR | Status: AC
  Administered 2013-03-16: 1 mg via INTRAVENOUS
  Filled 2013-03-16: qty 1

## 2013-03-16 MED ORDER — PROPRANOLOL HCL 20 MG PO TABS: 20.0000 mg | ORAL_TABLET | Freq: Two times a day (BID) | ORAL | Status: DC

## 2013-03-16 MED ORDER — PROTRIPTYLINE HCL 5 MG PO TABS: ORAL_TABLET | ORAL | Status: DC

## 2013-03-16 MED ORDER — CYCLOBENZAPRINE HCL 10 MG PO TABS: 10.0000 mg | ORAL_TABLET | Freq: Two times a day (BID) | ORAL | Status: DC | PRN

## 2013-03-16 NOTE — Discharge Summary (Signed)
Family Medicine Teaching [facility] Discharge Summary  Patient name: Kirsten Walsh Medical record number: [id number] Date of birth: October 03, 1960 Age: 52 y.o. Gender: female Date of Admission: 03/12/2013  Date of Discharge: 03/16/13 Admitting Physician: Janit Pagan, MD  Primary Care Provider: [name], MD Consultants: Neurology  Indication for Hospitalization: status migrainosis  Discharge Diagnoses/Problem List:  Patient Active Problem List   Diagnosis Date Noted  . Migraine, chronic, without aura, intractable, with status migrainosus 03/12/2013  . Bradycardia 02/18/2013  . Leg swelling 09/23/2011  . DIZZINESS 07/16/2008  . OBESITY, NOS 08/18/2006  . HYPERTENSION, BENIGN SYSTEMIC 08/18/2006   Disposition: Home  Discharge Condition: Stable  Brief Hospital Course:  Kirsten Walsh is a 52 y.o. female who presented with status migrainosis. PMH is significant for difficult to control migraines and HTN. Her home Topamax was continued and DHE protocol was initiated. She lost IV access in the morning, and refused to take medication by other routes (No PO, IM, Nasal, or suppository). IV team and PICC placement was unsuccessful and she became very upset that central-line was not deemed appropriate. DHE protocol was reinitiated with IM reglan, benadryl, Decadron, and DHE. Neurology was consulted and CTA head was obtain that did not show any abnormalities. Additionally she received IM and IV Dilaudid for pain control, which was discontinued for concern for medication overuse/rebound HA, and no relief reported with use. Protriptyline and Propranolol were initiated for additional prophylaxis. Nausea was controlled with Zofran, Phenergan and meclizine and she had no vomiting and improved PO intake at discharge.   # Status migrainosus: This is a recurrent problem. Recent MRI in 01/2013 was normal. States that the headache is typical of her migraines. No focal neurologic deficits.  Current migraine is associated with vertigo which happens with some, but not all, of her migraines. Continued home Topamax, and added propranolol and protriptyline for prophylaxis. PT evaluated for vestibular function and reccommended outpt neuro PT and Rolling walker (pt refused).Neurology c/s: CTA head obtained and shows no evidence of basilar artery stenosis or spasm. DHE protocol stopped after patient received 8 doses and reported that it was not providing any relief. Patient's nausea decrease and her appetite was improving at discharge and able to tolerate PO. At discharge her HA had improved and she was encouraged to follow-up with HA clinic as previously planned on last admission.    # Hypertension: Well controlled on admission, and we continued home clonidine patch . BP was 160/90 at discharge, and propranolol 20mg  BID was added.   Issues for Follow Up:  1. Headache clinic referral  2. Concern for medication overuse/rebound HA  Significant Procedures: PICC placement  Significant Labs and Imaging:   Recent Labs Lab 03/12/13 2250 03/13/13 0405 03/15/13 1030  WBC 6.2 6.5 10.2  HGB 15.0 15.5* 14.7  HCT 43.3 44.5 41.8  PLT PLATELET CLUMPS NOTED ON SMEAR, COUNT APPEARS ADEQUATE 244 226    Recent Labs Lab 03/12/13 1351 03/12/13 2058 03/13/13 0405 03/15/13 1030  NA 138  --  135 134*  K 3.8  --  3.9 3.6  CL 107  --  103 106  CO2 25  --  19 16*  GLUCOSE 99  --  177* 158*  BUN 9  --  7 6  CREATININE 1.00 0.81 0.88 0.94  CALCIUM 9.1  --  9.2 8.7   Imaging/Diagnostic Tests:  CTA Head:  Ventricle size is normal. Negative for intracranial hemorrhage.  Negative for acute or chronic infarct. Negative for mass lesion.  Postcontrast imaging reveals normal enhancement of the brain. The  calvarium is intact. Both vertebral arteries are patent to the basilar. Right vertebral  dominant. PICA patent bilaterally. AICA patent bilaterally. The  basilar is widely patent. Superior cerebellar  and posterior cerebral  arteries are patent bilaterally. Posterior communicating arteries  are patent bilaterally.Negative for cerebral aneurysm or vascular malformation.  EKG: NSR, normal intervals, no ST or T-wave changes  Results/Tests Pending at Time of Discharge:   Discharge Medications:   Medication List         cloNIDine 0.1 mg/24hr patch  Commonly known as:  CATAPRES - Dosed in mg/24 hr  Place 1 patch onto the skin once a week. Applied on Mondays.     cyclobenzaprine 10 MG tablet  Commonly known as:  FLEXERIL  Take 1 tablet (10 mg total) by mouth 2 (two) times daily as needed for muscle spasms. Migraine with pain > 7.     dihydroergotamine 4 MG/ML nasal spray  Commonly known as:  MIGRANAL  Place 1 spray into the nose 4 (four) times a week. One spray at the onset of migraine, may repeat after 15 mins for two total doses .     EPIPEN 0.3 mg/0.3 mL Devi  Generic drug:  EPINEPHrine  Inject 0.3 mg into the muscle once as needed. For allergic reaction     ondansetron 4 MG disintegrating tablet  Commonly known as:  ZOFRAN ODT  Take 1 tablet (4 mg total) by mouth every 6 (six) hours as needed for nausea.     promethazine 25 MG tablet  Commonly known as:  PHENERGAN  Take 1 tablet (25 mg total) by mouth every 6 (six) hours.     promethazine 25 MG suppository  Commonly known as:  PHENERGAN  Place 1 suppository (25 mg total) rectally every 6 (six) hours as needed for nausea.     propranolol 20 MG tablet  Commonly known as:  INDERAL  Take 1 tablet (20 mg total) by mouth 2 (two) times daily. For BP and migraine prevention.     protriptyline 5 MG tablet  Commonly known as:  VIVACTIL  Start with 1 pill for first week, then two pills second week, then three pills: 8 AM, noon and 4 PM.     senna-docusate 8.6-50 MG per tablet  Commonly known as:  Senokot-S  Take 2 tablets by mouth 2 (two) times daily.     SUMAtriptan 50 MG tablet  Commonly known as:  IMITREX  Take 1 tablet  (50 mg total) by mouth 2 (two) times a week. As needed for migraine (do not exceed 100 mg in one week).     topiramate 100 MG tablet  Commonly known as:  TOPAMAX  Take 1 tablet (100 mg total) by mouth 2 (two) times daily.       Discharge Instructions: Please refer to Patient Instructions section of EMR for full details.  Patient was counseled important signs and symptoms that should prompt return to medical care, changes in medications, dietary instructions, activity restrictions, and follow up appointments.   Follow-Up Appointments:  Wenda Low, MD 03/16/2013, 12:45 PM PGY-1, [facility]

## 2013-03-16 NOTE — Progress Notes (Signed)
Attending Addendum  I examined the patient and discussed the assessment and plan with Dr. Gayla Doss. I have reviewed the note and agree.  Patient reports that she is ready to go home today. She is medically stable for discharge with a negative CT angiogram of her head. She is tolerating a regular diet w/p vomiting. She is amenable to start protriptyline per neuro recommendations. We have also started propranolol for BP control and migraine prevention. She ask about pain medication to prevent her pain from getting out of control. We discuss options. Narcotics, BZ, NSAIDs, tramadol not recommended.  Will try muscle relaxer, flexeril.   She did want to readdress her concerns regarding her care team this admission since we had discussed these concerns in detail on 03/14/2013 I attempted to redirect her to coming up with a reasonable prevention plan that she could use at home.  Her major concern is "going home and dying from a migraine". I informed her that she would no die from a migraine. After this she told be that she knows she will not die from a migraine per se, and I did not have to correct her. After this exchange, she was visibly upset by my redirection and counselling and no longer wanted to discuss openly with me.  She agreed to discharge today.       Dessa Phi, MD FAMILY MEDICINE TEACHING SERVICE

## 2013-03-16 NOTE — Progress Notes (Signed)
Discharge instructions given. Pt verbalized understanding and all questions were answered.  

## 2013-03-16 NOTE — Progress Notes (Signed)
Family Medicine Teaching Service Daily Progress Note Intern Pager: [phone]  Patient name: Kirsten Walsh Medical record number: [id number] Date of birth: December 27, 1960 Age: 52 y.o. Gender: female  Primary Care Provider: [name], MD Consultants: None Code Status: Full  Pt Overview and Major Events to Date:   Assessment and Plan: Kirsten Walsh is a 52 y.o. female presenting with status migrainosis. PMH is significant for difficult to control migraines and HTN.   # Status migrainosus: This is a recurrent problem. Recent MRI in 01/2013 was normal. States that the headache is typical of her migraines. No focal neurologic deficits. Will sometimes respond well to DHE protocol. Current migraine is associated with vertigo which happens with some, but not all, of her migraines.  - continue home Topamax PO - zofran PO for associated nausea  - meclizine 12.5mg  BID to help with vertigo  - PICC line in place - PT evaluate vestibular function: Recs outpt neuro PT and Rolling walker (pt refused); continuing to follow  - Neurology c/s: Nortriptyline 25mg  qd started 9/25; Will inc to 50 qhs tonight   - CTA head: CTA reviewed and shows no evidence of basilar artery stenosis or spasm - DHE protocol stopped as not providing any relief and DHE shortage in the state - Continue: benadryl 25mg  po;  Decadron 4mg  IV q12 - Will decrease/stop dilaudid if possible as likely contributing to HA due to medication over use - Consider adding Toradol if HA does not continue to improve  # Hypertension: Well controlled on admission.  - BP last 24: 120-160/80-90 - continue home clonidine patch   FEN/GI:  - advancing diet as tolerate  - MIVF Prophylaxis: SQ heparin   Disposition: discharge pending improving in headache and nausea  Subjective: 6-710 HA today with some nausea but able to eat some breakfast (pancakes). Refuses to try PO NSAIDs as "they have not worked for her", even though she reported  yesterday that she had not taken any NSAIDs or Tylenol to try her headaches recently.   Objective: Temp:  [98 F (36.7 C)-98.9 F (37.2 C)] 98.9 F (37.2 C) (09/26 0530) Pulse Rate:  [49-76] 49 (09/26 0530) Resp:  [18-20] 20 (09/26 0530) BP: (126-169)/(80-91) 156/90 mmHg (09/26 0530) SpO2:  [97 %-100 %] 100 % (09/26 0530) Weight:  [220 lb (99.791 kg)] 220 lb (99.791 kg) (09/26 0500)  Physical Exam: General: alert, cooperative, NAD; HEENT: AT/[place], sclera white, EOMI Cardiovascular: RRR, no murmurs  Respiratory: CTAB, no wheezes or rales  Abdomen: +BS, soft, NTND  Extremities: Rt leg > Lt leg, + 1 edema in Rt leg to knee, good DP pulses  Laboratory:  Recent Labs Lab 03/12/13 2250 03/13/13 0405 03/15/13 1030  WBC 6.2 6.5 10.2  HGB 15.0 15.5* 14.7  HCT 43.3 44.5 41.8  PLT PLATELET CLUMPS NOTED ON SMEAR, COUNT APPEARS ADEQUATE 244 226    Recent Labs Lab 03/12/13 1351 03/12/13 2058 03/13/13 0405 03/15/13 1030  NA 138  --  135 134*  K 3.8  --  3.9 3.6  CL 107  --  103 106  CO2 25  --  19 16*  BUN 9  --  7 6  CREATININE 1.00 0.81 0.88 0.94  CALCIUM 9.1  --  9.2 8.7  GLUCOSE 99  --  177* 158*   Imaging/Diagnostic Tests: EKG: NSR, normal intervals, no ST or T-wave changes  Wenda Low, MD 03/16/2013, 9:22 AM PGY-1, [facility] Intern pager: [phone], text pages welcome

## 2013-03-16 NOTE — Progress Notes (Signed)
Physical Therapy Treatment Patient Details Name: Kirsten Walsh MRN: [id number] DOB: February 28, 1961 Today's Date: 03/16/2013 Time: 6578-4696 PT Time Calculation (min): 23 min  PT Assessment / Plan / Recommendation  History of Present Illness 52 y.o. female presenting with status migrainosis. Pt reports this headache she has had tendency to lean to her right (denies vertigo or sense of movement). She did fall PTA.  PMH is significant for difficult to control migraines and HTN.   PT Comments   Pt continues with severe headache, although is some better today. Agreeable to ambulation and pushed herself to do as much as possible. Nausea and imbalance increased with incr distance. Discussed d/c plan for OPPT for balance/migraines/dysequilibrium and pt plans to follow-up if symptoms persist once headache is controlled.   Follow Up Recommendations  Outpatient PT;Supervision for mobility/OOB     Does the patient have the potential to tolerate intense rehabilitation     Barriers to Discharge        Equipment Recommendations  None recommended by PT ((PT recommended cane or RW during amb, pt refused AD)    Recommendations for Other Services    Frequency Min 4X/week   Progress towards PT Goals Progress towards PT goals: Progressing toward goals  Plan Current plan remains appropriate    Precautions / Restrictions Precautions Precautions: Fall   Pertinent Vitals/Pain Headache, unable to rate "now is not a good time to ask" (pt indicating incr stress due to personal situation    Mobility  Bed Mobility Bed Mobility: Supine to Sit;Sitting - Scoot to Edge of Bed Supine to Sit: 6: Modified independent (Device/Increase time);HOB elevated Sitting - Scoot to Edge of Bed: 7: Independent Transfers Transfers: Sit to Stand;Stand to Sit Sit to Stand: 4: Min guard Stand to Sit: 4: Min assist Details for Transfer Assistance: assist due to dysequilibrium; worsened while up and required incr assist as  she returned to sitting' Ambulation/Gait Ambulation/Gait Assistance: 4: Min assist Ambulation Distance (Feet): 200 Feet Assistive device: None;Other (Comment) (none vs IV pole as balance deteriorated) Ambulation/Gait Assistance Details: Pt initially slightly unsteady, but able to ambulate without overt loss of balance; as her headache and nausea worsened, she began reaching to hold onto wall and ultimately held IV pole to maintain balance. Educated to incr width of her stride. Gait Pattern: Decreased trunk rotation;Narrow base of support;Step-through pattern;Decreased stride length;Lateral trunk lean to right Gait velocity: Decreased  Stairs: No    Exercises Other Exercises Other Exercises: Pt too nauseous on return to her room (from walking) and unable to review exercises   PT Diagnosis:    PT Problem List:   PT Treatment Interventions:     PT Goals (current goals can now be found in the care plan section) Acute Rehab PT Goals Patient Stated Goal: to decr headache and improve balance  Visit Information  Last PT Received On: 03/16/13 Assistance Needed: +1 History of Present Illness: 52 y.o. female presenting with status migrainosis. Pt reports this headache she has had tendency to lean to her right (denies vertigo or sense of movement). She did fall PTA.  PMH is significant for difficult to control migraines and HTN.    Subjective Data  Subjective: pt reports her balance is better today (although still off); does not want to use a walker, will consider using a cane, however states her daughters will be with her 24/7 and can assist her to reduce fall risk Patient Stated Goal: to decr headache and improve balance   Cognition  Cognition Arousal/Alertness:  Awake/alert Behavior During Therapy: Flat affect Overall Cognitive Status: Within Functional Limits for tasks assessed    Balance  Balance Balance Assessed: Yes Static Standing Balance Static Standing - Balance Support: No upper  extremity supported Static Standing - Level of Assistance: 5: Stand by assistance  End of Session PT - End of Session Equipment Utilized During Treatment: Gait belt Activity Tolerance: Treatment limited secondary to medical complications (Comment) Patient left: with call bell/phone within reach;with nursing/sitter in room;Other (comment) (sitting EOB with RN) Nurse Communication: Other (comment) (pt requests nausea meds)   GP     Humza Tallerico 03/16/2013, 2:28 PM Pager [phone]

## 2013-03-16 NOTE — Discharge Summary (Signed)
Attending Addendum  I examined the patient and discussed the discharge plan with Dr. Joyner. I have reviewed the note and agree.    Latifah Padin, MD FAMILY MEDICINE TEACHING SERVICE   

## 2013-03-16 NOTE — Progress Notes (Signed)
Subjective: Patient continues to have a headache today but reports that it is improved.  Her headache today is a 6-7/10.  She is receiving IV fluids and is eating her first solid meal.  She was made aware that we will be experiencing some access issues with DHE.  She also has been made aware that it is not realistic goal that she would achieve a 0/10 headache rating at discharge.  She is in agreement.    Objective: Current vital signs: BP 156/90  Pulse 49  Temp(Src) 98.9 F (37.2 C) (Oral)  Resp 20  Ht 5\' 5"  (1.651 m)  Wt 99.791 kg (220 lb)  BMI 36.61 kg/m2  SpO2 100% Vital signs in last 24 hours: Temp:  [98 F (36.7 C)-98.9 F (37.2 C)] 98.9 F (37.2 C) (09/26 0530) Pulse Rate:  [49-76] 49 (09/26 0530) Resp:  [18-20] 20 (09/26 0530) BP: (126-169)/(80-91) 156/90 mmHg (09/26 0530) SpO2:  [97 %-100 %] 100 % (09/26 0530) Weight:  [99.791 kg (220 lb)] 99.791 kg (220 lb) (09/26 0500)  Intake/Output from previous day: 09/25 0701 - 09/26 0700 In: 3012.1 [P.O.:1260; I.V.:752.1; IV Piggyback:1000] Out: -  Intake/Output this shift:   Nutritional status: Sodium Restricted  Neurologic Exam: Mental Status:  Alert, oriented, thought content appropriate. Speech fluent without evidence of aphasia. Able to follow 3 step commands without difficulty.  Cranial Nerves:  II: Discs flat bilaterally; Visual fields grossly normal, pupils equal, round, reactive to light and accommodation  III,IV, VI: ptosis not present, extra-ocular motions intact bilaterally  V,VII: smile symmetric, facial light touch sensation normal bilaterally  VIII: hearing normal bilaterally  IX,X: gag reflex present  XI: bilateral shoulder shrug  XII: midline tongue extension  Motor:  Patient able to lift all extremities above gravity Sensory: Pinprick and light touch intact throughout, bilaterally  Deep Tendon Reflexes:  2+ throughout Plantars:  Right: downgoing      Left: downgoing    Lab Results: Basic Metabolic  Panel:  Recent Labs Lab 03/12/13 1351 03/12/13 2058 03/13/13 0405 03/15/13 1030  NA 138  --  135 134*  K 3.8  --  3.9 3.6  CL 107  --  103 106  CO2 25  --  19 16*  GLUCOSE 99  --  177* 158*  BUN 9  --  7 6  CREATININE 1.00 0.81 0.88 0.94  CALCIUM 9.1  --  9.2 8.7    Liver Function Tests: No results found for this basename: AST, ALT, ALKPHOS, BILITOT, PROT, ALBUMIN,  in the last 168 hours No results found for this basename: LIPASE, AMYLASE,  in the last 168 hours No results found for this basename: AMMONIA,  in the last 168 hours  CBC:  Recent Labs Lab 03/12/13 1351 03/12/13 2250 03/13/13 0405 03/15/13 1030  WBC 3.7* 6.2 6.5 10.2  HGB 12.9 15.0 15.5* 14.7  HCT 38.2 43.3 44.5 41.8  MCV 93.4 91.5 91.4 90.7  PLT 192 PLATELET CLUMPS NOTED ON SMEAR, COUNT APPEARS ADEQUATE 244 226    Cardiac Enzymes: No results found for this basename: CKTOTAL, CKMB, CKMBINDEX, TROPONINI,  in the last 168 hours  Lipid Panel: No results found for this basename: CHOL, TRIG, HDL, CHOLHDL, VLDL, LDLCALC,  in the last 168 hours  CBG: No results found for this basename: GLUCAP,  in the last 168 hours  Microbiology: Results for orders placed during the hospital encounter of 02/13/13  WET PREP, GENITAL     Status: Abnormal   Collection Time  02/14/13 10:05 PM      Result Value Range Status   Yeast Wet Prep HPF POC NONE SEEN  NONE SEEN Final   Trich, Wet Prep MANY (*) NONE SEEN Final   Clue Cells Wet Prep HPF POC FEW (*) NONE SEEN Final   WBC, Wet Prep HPF POC TOO NUMEROUS TO COUNT (*) NONE SEEN Final    Coagulation Studies: No results found for this basename: LABPROT, INR,  in the last 72 hours  Imaging: Ct Angio Head W/cm &/or Wo Cm  03/15/2013   CLINICAL DATA:  Migraine headaches  EXAM: CT ANGIOGRAPHY HEAD  TECHNIQUE: Multidetector CT imaging of the head was performed using the standard protocol during bolus administration of intravenous contrast. Multiplanar CT image  reconstructions including MIPs were obtained to evaluate the vascular anatomy.  CONTRAST:  80mL OMNIPAQUE IOHEXOL 350 MG/ML SOLN  COMPARISON:  MRI 02/18/2013. CT 01/04/2013  FINDINGS: Ventricle size is normal. Negative for intracranial hemorrhage. Negative for acute or chronic infarct. Negative for mass lesion. Postcontrast imaging reveals normal enhancement of the brain. The calvarium is intact.  Both vertebral arteries are patent to the basilar. Right vertebral dominant. PICA patent bilaterally. AICA patent bilaterally. The basilar is widely patent. Superior cerebellar and posterior cerebral arteries are patent bilaterally. Posterior communicating arteries are patent bilaterally.  Right cavernous carotid artery is patent without stenosis. Right anterior and middle cerebral arteries are widely patent  Left cavernous carotid is widely patent. Left anterior and middle cerebral arteries are widely patent.  Negative for cerebral aneurysm or vascular malformation.  Review of the MIP images confirms the above findings.  IMPRESSION: Normal   Electronically Signed   By: Marlan Palau M.D.   On: 03/15/2013 18:05   Ir Fluoro Guide Cv Line Right  03/15/2013   *RADIOLOGY REPORT*  Indication: Poor venous access, in need of intravenous access for contrast administration for impending CT scan.  ULTRASOUND AND FLUORSCOPIC GUIDED PICC LINE INSERTION  Intravenous Medications: None  Contrast: None  Fluoroscopy Time:  6 seconds  Complications: None immediate  Technique / Findings:  The procedure, risks, benefits, and alternatives were explained to the patient and informed written consent was obtained.  A timeout was performed prior to the initiation of the procedure.  The right upper extremity was prepped with chlorhexidine in a sterile fashion, and a sterile drape was applied covering the operative field.  Maximum barrier sterile technique with sterile gowns and gloves were used for the procedure.  A timeout was performed prior  to the initiation of the procedure.  Local anesthesia was provided with 1% lidocaine.  Under direct ultrasound guidance, the rightbasilicvein was accessed with a micropuncture kit after the overlying soft tissues were anesthetized with 1% lidocaine.  An ultrasound image was saved for documentation purposes.  A guidewire was advanced to the level of the superior caval-atrial junction for measurement purposes and the PICC line was cut to length.  A peel-away sheath was placed and a 40 cm, 5 [place], dual lumen was inserted to level of the superior caval-atrial junction.  A post procedure spot fluoroscopic was obtained.  The catheter easily aspirated and flushed and was sutured in place.  A dressing was placed.  The patient tolerated the procedure well without immediate post procedural complication.  Impression:  Successful ultrasound and fluoroscopic guided placement of a right basilic vein approach, 40 cm, 5 French,dual lumen PICC with tip at the superior caval-atrial junction.  The PICC line is ready for immediate use.  Original Report Authenticated By: Tacey Ruiz, MD   Ir US Guide Vasc Access Right  03/15/2013   *RADIOLOGY REPORT*  Indication: Poor venous access, in need of intravenous access for contrast administration for impending CT scan.  ULTRASOUND AND FLUORSCOPIC GUIDED PICC LINE INSERTION  Intravenous Medications: None  Contrast: None  Fluoroscopy Time:  6 seconds  Complications: None immediate  Technique / Findings:  The procedure, risks, benefits, and alternatives were explained to the patient and informed written consent was obtained.  A timeout was performed prior to the initiation of the procedure.  The right upper extremity was prepped with chlorhexidine in a sterile fashion, and a sterile drape was applied covering the operative field.  Maximum barrier sterile technique with sterile gowns and gloves were used for the procedure.  A timeout was performed prior to the initiation of the procedure.   Local anesthesia was provided with 1% lidocaine.  Under direct ultrasound guidance, the rightbasilicvein was accessed with a micropuncture kit after the overlying soft tissues were anesthetized with 1% lidocaine.  An ultrasound image was saved for documentation purposes.  A guidewire was advanced to the level of the superior caval-atrial junction for measurement purposes and the PICC line was cut to length.  A peel-away sheath was placed and a 40 cm, 5 [place], dual lumen was inserted to level of the superior caval-atrial junction.  A post procedure spot fluoroscopic was obtained.  The catheter easily aspirated and flushed and was sutured in place.  A dressing was placed.  The patient tolerated the procedure well without immediate post procedural complication.  Impression:  Successful ultrasound and fluoroscopic guided placement of a right basilic vein approach, 40 cm, 5 French,dual lumen PICC with tip at the superior caval-atrial junction.  The PICC line is ready for immediate use.   Original Report Authenticated By: Tacey Ruiz, MD    Medications:  I have reviewed the patient's current medications. Scheduled: . cloNIDine  0.1 mg Transdermal Weekly  . dexamethasone  4 mg Intravenous Q12H  . dihydroergotamine  1 mg Intravenous Q8H  . diphenhydrAMINE  25 mg Oral Q8H  . heparin  5,000 Units Subcutaneous Q8H  . meclizine  12.5 mg Oral BID  . nortriptyline  25 mg Oral QHS  . senna  1 tablet Oral BID  . topiramate  100 mg Oral BID    Assessment/Plan: Headaches gradually improving.  Diet being advanced.  CTA reviewed and shows no evidence of basilar artery stenosis or spasm.  Have spoken with pharmacy and they report that we are limited as to our availability of IV DHE.  With headache improving we may need to change to intranasal administration but I do not expect this to be as beneficial.  Pamelor initiated.  Recommendations: 1.  May continue Pamelor but patient has tried this before and lost  benefit.  Verapamil remains an option as well for prophylaxis     LOS: 4 days   Thana Farr, MD [facility] [phone] 03/16/2013  9:02 AM

## 2013-03-19 ENCOUNTER — Telehealth: Payer: Self-pay | Admitting: Family Medicine

## 2013-03-19 NOTE — Telephone Encounter (Signed)
Pt states that she need her migraine prescription sent to pharmacy.

## 2013-03-19 NOTE — Telephone Encounter (Signed)
Have you seen the paperwork from the school?  Pt states that they still haven't received it.  Please advise on this.  Also she received her flexeril, propranolol, and phenergan at pharmacy but was told that they were waiting on confirmation for the other 3 meds.  Spoke with pharmacy and when pt came to pick up medication one of her rx's hadn't come through computer yet, and the other 2 were out of stock.  So I informed pt that she would be able to pick up 2 out of the 3 today.  Jazmin Hartsell,CMA

## 2013-03-19 NOTE — Telephone Encounter (Signed)
I filled out and faxed the form to the school. I also gave the form to Shanda Bumps to mail to the address on the form. I will send in the migraine medication.

## 2013-03-19 NOTE — Telephone Encounter (Signed)
Patient just had this prescription sent in to the pharmacy. If they were unable to fill it I will fill it for the patient, but if she has taken all 20 tabs since 9/26 she is not taking the medication appropriately and I can not refill it without her being seen.

## 2013-03-19 NOTE — Telephone Encounter (Signed)
Pt states that we have had the paperwork for three ways and [employer] have not heard back concerning her migraine and it needed to be faxed.

## 2013-03-20 NOTE — Telephone Encounter (Signed)
Left message for pt giving message from md.  [facility]

## 2013-03-22 ENCOUNTER — Other Ambulatory Visit: Payer: Self-pay | Admitting: Family Medicine

## 2013-03-22 ENCOUNTER — Telehealth: Payer: Self-pay | Admitting: Family Medicine

## 2013-03-22 DIAGNOSIS — G43909 Migraine, unspecified, not intractable, without status migrainosus: Secondary | ICD-10-CM

## 2013-03-22 MED ORDER — SUMATRIPTAN SUCCINATE 50 MG PO TABS: 50.0000 mg | ORAL_TABLET | ORAL | Status: DC

## 2013-03-22 NOTE — Telephone Encounter (Signed)
Patient called today to address concerns that she had during her hospitalization.   She expressed that she did not want to discuss concerns over the phone. She was interested in the following:  1. new referral to headache and wellness clinic. Dr. Neale Burly.  2. clarification on her Vivactil instructions. Clarification provided. As written on prescription I want the patient to take one pill daily for one week, then one pill twice daily for one week, then one pill 3 times daily.  3. followup appointment with Dr. De Nurse. Patient's followup appointment was not arranged at discharge. When she called into the office to express her concerns on 920 03/19/2013 she did not schedule followup appointment. She currently does not have a followup appointment.   Plan: Informed patient that I will about this to Dr. De Nurse suite and placed in a referral to Dr. Neale Burly at the headache [facility] wellness Center.  I also informed the patient the patient fell about this note to the admin team who will attempt to get her on Dr. Isabella Bowens PM schedule as she is available in the afternoons.     Although the patient did not want to discuss her hospital care with with me over the phone I am quite certain I know her concerns which are detailed to the best of my ability below:  She was hospitalized from 03/12/2013 to 03/16/2013 for status migrainosus during which time I was the attending physician.  On hospital day #2 (03/13/2013) she saw Dr. Janit Pagan who was the admitting physician overnight from 03/12/2013 to 03/13/2013. For all subsequent hospital days she saw me.  Refreshing my memory, on the morning of 03/14/2013 saw the patient and she expressed significant upset and concerned about her "team". She reports that she had a very bad night involving the lack of IV access. As detailed in the documentation patient lost peripheral IV access on 03/13/2013 and attempt to place a PICC line was unsuccessful. When asked  about this by my resident I told him to consult to see him for possible central line placement. As documented in Dr.Yacoub note a central line was not placed as he felt the risk outweighed the benefit.  Subsequently the patient was transitioned from IV DHE to IM as well as oral narcotics to control her pain. She is very upset by her lack of IV access and expressed to me that she felt that her "team" was not adequately responding to her concerns. She felt that her "team" was more responsive during her last hospitalization.  When I saw the patient on 03/14/2013 listen to her concerns and expressed a desire to better address her pain and concerns going forward. She reported that she was slightly better in spite of her bad night. She was awaiting neurology assessment. She agreed to try a Vicodin tablet 10/325 x1 along with Ativan solution in order to get some rest and to control her pain.   I saw the patient again on 03/15/2013 and once again to slightly improved and tolerating orals. I saw the patient again on 03/16/2013. She is even more improved at this time and we were discussing discharge plan. She stated that she desired discharge to home on that date. During our discussion of the discharge plan she became visibly upset with me when she attempted to refer back to the bad night she had on 03/13/2013 and her concerns about her "team" and I asked her if we could focus on the discharge plan and a home plan going forward (please  see my progress note from 03/16/2013 for more details).

## 2013-03-22 NOTE — Progress Notes (Signed)
Placed referral for headache clinic.

## 2013-03-26 ENCOUNTER — Ambulatory Visit (INDEPENDENT_AMBULATORY_CARE_PROVIDER_SITE_OTHER): Payer: Medicaid Other | Admitting: Family Medicine

## 2013-03-26 ENCOUNTER — Other Ambulatory Visit: Payer: Self-pay | Admitting: Family Medicine

## 2013-03-26 ENCOUNTER — Encounter: Payer: Self-pay | Admitting: Family Medicine

## 2013-03-26 VITALS — BP 144/94 | HR 105 | Temp 99.1°F | Wt 208.0 lb

## 2013-03-26 DIAGNOSIS — G43019 Migraine without aura, intractable, without status migrainosus: Secondary | ICD-10-CM

## 2013-03-26 DIAGNOSIS — I10 Essential (primary) hypertension: Secondary | ICD-10-CM

## 2013-03-26 MED ORDER — CLONIDINE HCL 0.1 MG/24HR TD PTWK: 1.0000 | MEDICATED_PATCH | TRANSDERMAL | Status: DC

## 2013-03-26 NOTE — Assessment & Plan Note (Signed)
Referral made to neurology for further evaluation of migraines. Patient to hold migranol as this has irritated her nose. Other wise to continue current medications.

## 2013-03-26 NOTE — Progress Notes (Signed)
Patient ID: Kirsten Walsh, female   DOB: April 26, 1961, 52 y.o.   MRN: [id number] Kirsten Walsh is a 52 y.o. female who presents today for hospital f/u of migraine.  Continues to have migraines that come and go on an almost daily basis. Is currently on topamax, propranolol, vivactil, migranol, and imitrex. States migranol burns her nose. Wants to get back in to the headache clinic.    Past Medical History  Diagnosis Date  . Hypertension   . Chest pain   . Gall stones   . Migraine     "qd" (02/14/2013)  . Chronic lower back pain     "right side to mid back" (02/14/2013)  . Kidney stone ~ 2006    History  Smoking status  . Never Smoker   Smokeless tobacco  . Never Used    Family History  Problem Relation Age of Onset  . Cancer Mother   . Cirrhosis Father     Current Outpatient Prescriptions on File Prior to Visit  Medication Sig Dispense Refill  . cyclobenzaprine (FLEXERIL) 10 MG tablet Take 1 tablet (10 mg total) by mouth 2 (two) times daily as needed for muscle spasms. Migraine with pain > 7.  30 tablet  0  . dihydroergotamine (MIGRANAL) 4 MG/ML nasal spray Place 1 spray into the nose 4 (four) times a week. One spray at the onset of migraine, may repeat after 15 mins for two total doses .  16 mL  1  . EPINEPHrine (EPIPEN) 0.3 mg/0.3 mL DEVI Inject 0.3 mg into the muscle once as needed. For allergic reaction      . ondansetron (ZOFRAN ODT) 4 MG disintegrating tablet Take 1 tablet (4 mg total) by mouth every 6 (six) hours as needed for nausea.  30 tablet  1  . promethazine (PHENERGAN) 25 MG suppository Place 1 suppository (25 mg total) rectally every 6 (six) hours as needed for nausea.  12 each  0  . promethazine (PHENERGAN) 25 MG tablet Take 1 tablet (25 mg total) by mouth every 6 (six) hours.  30 tablet  0  . propranolol (INDERAL) 20 MG tablet Take 1 tablet (20 mg total) by mouth 2 (two) times daily. For BP and migraine prevention.  60 tablet  1  . protriptyline  (VIVACTIL) 5 MG tablet Start with 1 pill for first week, then two pills second week, then three pills: 8 AM, noon and 4 PM.  90 tablet  1  . senna-docusate (SENOKOT-S) 8.6-50 MG per tablet Take 2 tablets by mouth 2 (two) times daily.  30 tablet  0  . SUMAtriptan (IMITREX) 50 MG tablet Take 1 tablet (50 mg total) by mouth 2 (two) times a week. As needed for migraine (do not exceed 100 mg in one week).  20 tablet  0  . topiramate (TOPAMAX) 100 MG tablet Take 1 tablet (100 mg total) by mouth 2 (two) times daily.  60 tablet  1   No current facility-administered medications on file prior to visit.    ROS: Per HPI   Physical Exam Filed Vitals:   03/26/13 1416  BP: 144/94  Pulse: 105  Temp: 99.1 F (37.3 C)    Physical Examination: General appearance - alert, well appearing, and in no distress Eyes - pupils equal and reactive, extraocular eye movements intact Mouth - mucous membranes moist, pharynx normal without lesions Neck - supple, no significant adenopathy Neurological - cranial nerves II through XII intact   Assessment/Plan: Please see individual problem list.

## 2013-03-26 NOTE — Assessment & Plan Note (Signed)
Slightly elevated today. Previously at goal at last visit. If elevated at next visit will need to consider addition of norvasc.

## 2013-03-26 NOTE — Patient Instructions (Signed)
Nice to see you again. We will try to get you in to the headache clinic for follow-up. At this time please take the imitrex as needed for migraines and hold off on the migranol nasal spray. Please continue to take your other headache medications. I will see you back in about a month to follow-up on your blood pressure.

## 2013-03-28 ENCOUNTER — Telehealth: Payer: Self-pay | Admitting: Family Medicine

## 2013-03-28 MED ORDER — CLONIDINE HCL 0.1 MG PO TABS: 0.1000 mg | ORAL_TABLET | Freq: Two times a day (BID) | ORAL | Status: DC

## 2013-03-28 NOTE — Telephone Encounter (Signed)
Patients clonidine patch required prior authorization. I could not find in her records where she had failed the preferred medications. Will have to switch patient to clonidine 0.1 mg PO BID.

## 2013-03-28 NOTE — Telephone Encounter (Signed)
LMOVM informing pt. [name], Jessica Dawn  

## 2013-04-19 ENCOUNTER — Emergency Department (HOSPITAL_BASED_OUTPATIENT_CLINIC_OR_DEPARTMENT_OTHER): Payer: Medicaid Other

## 2013-04-19 ENCOUNTER — Emergency Department (HOSPITAL_BASED_OUTPATIENT_CLINIC_OR_DEPARTMENT_OTHER)
Admission: EM | Admit: 2013-04-19 | Discharge: 2013-04-19 | Disposition: A | Discharge status: Home / Self Care | Payer: Medicaid Other | Attending: Emergency Medicine | Admitting: Emergency Medicine

## 2013-04-19 ENCOUNTER — Encounter (HOSPITAL_BASED_OUTPATIENT_CLINIC_OR_DEPARTMENT_OTHER): Payer: Self-pay | Admitting: Emergency Medicine

## 2013-04-19 ENCOUNTER — Other Ambulatory Visit: Payer: Self-pay

## 2013-04-19 DIAGNOSIS — I10 Essential (primary) hypertension: Secondary | ICD-10-CM | POA: Insufficient documentation

## 2013-04-19 DIAGNOSIS — R404 Transient alteration of awareness: Secondary | ICD-10-CM | POA: Insufficient documentation

## 2013-04-19 DIAGNOSIS — G8929 Other chronic pain: Secondary | ICD-10-CM | POA: Insufficient documentation

## 2013-04-19 DIAGNOSIS — Z8719 Personal history of other diseases of the digestive system: Secondary | ICD-10-CM | POA: Insufficient documentation

## 2013-04-19 DIAGNOSIS — Z3202 Encounter for pregnancy test, result negative: Secondary | ICD-10-CM | POA: Insufficient documentation

## 2013-04-19 DIAGNOSIS — G43909 Migraine, unspecified, not intractable, without status migrainosus: Secondary | ICD-10-CM | POA: Insufficient documentation

## 2013-04-19 DIAGNOSIS — R4020 Unspecified coma: Secondary | ICD-10-CM

## 2013-04-19 DIAGNOSIS — Z79899 Other long term (current) drug therapy: Secondary | ICD-10-CM | POA: Insufficient documentation

## 2013-04-19 DIAGNOSIS — R42 Dizziness and giddiness: Secondary | ICD-10-CM | POA: Insufficient documentation

## 2013-04-19 DIAGNOSIS — Z87442 Personal history of urinary calculi: Secondary | ICD-10-CM | POA: Insufficient documentation

## 2013-04-19 LAB — URINALYSIS, ROUTINE W REFLEX MICROSCOPIC
Glucose, UA: NEGATIVE mg/dL
Specific Gravity, Urine: 1.015 (ref 1.005–1.030)
pH: 7.5 (ref 5.0–8.0)

## 2013-04-19 LAB — URINE MICROSCOPIC-ADD ON

## 2013-04-19 LAB — CBC WITH DIFFERENTIAL/PLATELET
Basophils Absolute: 0 10*3/uL (ref 0.0–0.1)
Basophils Relative: 1 % (ref 0–1)
Eosinophils Absolute: 0.1 10*3/uL (ref 0.0–0.7)
Eosinophils Relative: 3 % (ref 0–5)
HCT: 42.5 % (ref 36.0–46.0)
Lymphocytes Relative: 34 % (ref 12–46)
MCH: 31.4 pg (ref 26.0–34.0)
MCHC: 34.1 g/dL (ref 30.0–36.0)
MCV: 92 fL (ref 78.0–100.0)
Monocytes Absolute: 0.3 10*3/uL (ref 0.1–1.0)
Platelets: 228 10*3/uL (ref 150–400)
RDW: 13.4 % (ref 11.5–15.5)
WBC: 4.3 10*3/uL (ref 4.0–10.5)

## 2013-04-19 LAB — COMPREHENSIVE METABOLIC PANEL
ALT: 14 U/L (ref 0–35)
AST: 19 U/L (ref 0–37)
Albumin: 3.8 g/dL (ref 3.5–5.2)
Calcium: 9.3 mg/dL (ref 8.4–10.5)
Sodium: 139 mEq/L (ref 135–145)
Total Protein: 7.3 g/dL (ref 6.0–8.3)

## 2013-04-19 MED ORDER — MORPHINE SULFATE 4 MG/ML IJ SOLN: 4.0000 mg | Freq: Once | INTRAMUSCULAR | Status: DC

## 2013-04-19 MED ORDER — PROMETHAZINE HCL 25 MG/ML IJ SOLN
25.0000 mg | Freq: Once | INTRAMUSCULAR | Status: AC
  Administered 2013-04-19: 25 mg via INTRAVENOUS
  Filled 2013-04-19: qty 1

## 2013-04-19 MED ORDER — HYDROMORPHONE HCL PF 1 MG/ML IJ SOLN
1.0000 mg | Freq: Once | INTRAMUSCULAR | Status: AC
  Administered 2013-04-19: 1 mg via INTRAVENOUS
  Filled 2013-04-19: qty 1

## 2013-04-19 MED ORDER — DIPHENHYDRAMINE HCL 50 MG/ML IJ SOLN
25.0000 mg | Freq: Once | INTRAMUSCULAR | Status: AC
  Administered 2013-04-19: 25 mg via INTRAVENOUS
  Filled 2013-04-19: qty 1

## 2013-04-19 MED ORDER — HYDROCODONE-ACETAMINOPHEN 5-325 MG PO TABS
2.0000 | ORAL_TABLET | Freq: Once | ORAL | Status: AC
  Administered 2013-04-19: 2 via ORAL
  Filled 2013-04-19: qty 2

## 2013-04-19 MED ORDER — SODIUM CHLORIDE 0.9 % IV BOLUS (SEPSIS)
1000.0000 mL | Freq: Once | INTRAVENOUS | Status: AC
  Administered 2013-04-19: 1000 mL via INTRAVENOUS

## 2013-04-19 MED ORDER — ONDANSETRON HCL 4 MG/2ML IJ SOLN
4.0000 mg | Freq: Once | INTRAMUSCULAR | Status: AC
  Administered 2013-04-19: 4 mg via INTRAVENOUS
  Filled 2013-04-19: qty 2

## 2013-04-19 MED ORDER — METOCLOPRAMIDE HCL 5 MG/ML IJ SOLN
10.0000 mg | Freq: Once | INTRAMUSCULAR | Status: AC
  Administered 2013-04-19: 10 mg via INTRAVENOUS
  Filled 2013-04-19: qty 2

## 2013-04-19 NOTE — ED Notes (Signed)
Pt refused to have IV removed until she talked with MD

## 2013-04-19 NOTE — ED Notes (Signed)
Pt tolerating ginger ale PO.

## 2013-04-19 NOTE — ED Notes (Signed)
MD at bedside. 

## 2013-04-19 NOTE — ED Provider Notes (Signed)
CSN: [id number]     Arrival date & time 04/19/13  1133 History   First MD Initiated Contact with Patient 04/19/13 1157     Chief Complaint  Patient presents with  . Seizures    tonic-clonic seizure in school lasting approx 2 min.    (Consider location/radiation/quality/duration/timing/severity/associated sxs/prior Treatment) HPI Comments: Patient with history of intractable migraines presenting from class with apparent "seizure". She was sitting in class and began to feel nauseated. She went to the bathroom and then apparently fell to the floor. Her classmates told her that she had a seizure for 2 minutes. No previous history of seizures. No tongue biting or incontinence. She's had a typical migraine headache for the past 3 days associated with nausea and vomiting. She denies any fevers, change in her chronic right arm weakness, abdominal pain, chest pain or shortness of breath.  The history is provided by the patient and the EMS personnel.    Past Medical History  Diagnosis Date  . Hypertension   . Chest pain   . Gall stones   . Migraine     "qd" (02/14/2013)  . Chronic lower back pain     "right side to mid back" (02/14/2013)  . Kidney stone ~ 2006   Past Surgical History  Procedure Laterality Date  . Laparoscopic cholecystectomy  ~ 2004  . Cystoscopy w/ stone manipulation  ~ 2006  . Intrauterine device insertion      "initially put in in 04/2002; changed prn" (02/14/2013)   Family History  Problem Relation Age of Onset  . Cancer Mother   . Cirrhosis Father    History  Substance Use Topics  . Smoking status: Never Smoker   . Smokeless tobacco: Never Used  . Alcohol Use: Yes     Comment: 02/14/2013 "socially; couple times/month"   OB History   Grav Para Term Preterm Abortions TAB SAB Ect Mult Living                 Review of Systems  Constitutional: Negative for fever, activity change and appetite change.  Respiratory: Negative for cough, chest tightness and shortness  of breath.   Cardiovascular: Negative for chest pain.  Gastrointestinal: Negative for nausea, vomiting and abdominal pain.  Genitourinary: Negative for dysuria, hematuria, vaginal bleeding and vaginal discharge.  Musculoskeletal: Negative for back pain.  Skin: Negative for rash.  Neurological: Positive for dizziness, seizures and headaches. Negative for light-headedness.  A complete 10 system review of systems was obtained and all systems are negative except as noted in the HPI and PMH.    Allergies  Apple; Aspirin; and Carrot  Home Medications   Current Outpatient Rx  Name  Route  Sig  Dispense  Refill  . cloNIDine (CATAPRES) 0.1 MG tablet   Oral   Take 1 tablet (0.1 mg total) by mouth 2 (two) times daily.   60 tablet   1   . cyclobenzaprine (FLEXERIL) 10 MG tablet   Oral   Take 1 tablet (10 mg total) by mouth 2 (two) times daily as needed for muscle spasms. Migraine with pain > 7.   30 tablet   0   . dihydroergotamine (MIGRANAL) 4 MG/ML nasal spray   Nasal   Place 1 spray into the nose 4 (four) times a week. One spray at the onset of migraine, may repeat after 15 mins for two total doses .   16 mL   1   . EPINEPHrine (EPIPEN) 0.3 mg/0.3 mL DEVI  Intramuscular   Inject 0.3 mg into the muscle once as needed. For allergic reaction         . ondansetron (ZOFRAN ODT) 4 MG disintegrating tablet   Oral   Take 1 tablet (4 mg total) by mouth every 6 (six) hours as needed for nausea.   30 tablet   1   . promethazine (PHENERGAN) 25 MG suppository   Rectal   Place 1 suppository (25 mg total) rectally every 6 (six) hours as needed for nausea.   12 each   0   . promethazine (PHENERGAN) 25 MG tablet   Oral   Take 1 tablet (25 mg total) by mouth every 6 (six) hours.   30 tablet   0   . propranolol (INDERAL) 20 MG tablet   Oral   Take 1 tablet (20 mg total) by mouth 2 (two) times daily. For BP and migraine prevention.   60 tablet   1   . protriptyline (VIVACTIL) 5  MG tablet      Start with 1 pill for first week, then two pills second week, then three pills: 8 AM, noon and 4 PM.   90 tablet   1   . senna-docusate (SENOKOT-S) 8.6-50 MG per tablet   Oral   Take 2 tablets by mouth 2 (two) times daily.   30 tablet   0   . SUMAtriptan (IMITREX) 50 MG tablet   Oral   Take 1 tablet (50 mg total) by mouth 2 (two) times a week. As needed for migraine (do not exceed 100 mg in one week).   20 tablet   0   . topiramate (TOPAMAX) 100 MG tablet   Oral   Take 1 tablet (100 mg total) by mouth 2 (two) times daily.   60 tablet   1    BP 156/82  Pulse 78  Resp 18  SpO2 100% Physical Exam  Constitutional: She is oriented to person, place, and time. She appears well-developed and well-nourished. No distress.  HENT:  Head: Normocephalic and atraumatic.  Mouth/Throat: Oropharynx is clear and moist. No oropharyngeal exudate.  Eyes: Conjunctivae and EOM are normal.  Neck: Normal range of motion. Neck supple.  No meningismus  Cardiovascular: Normal rate, regular rhythm and normal heart sounds.   No murmur heard. Pulmonary/Chest: Effort normal and breath sounds normal. No respiratory distress.  Abdominal: Soft. Bowel sounds are normal. There is no tenderness. There is no rebound and no guarding.  Musculoskeletal: She exhibits no edema and no tenderness.  Neurological: She is alert and oriented to person, place, and time. No cranial nerve deficit. She exhibits normal muscle tone. Coordination normal.  RUE weakness at baseline.  CN 2-12 intact. No ataxia on finger to nose.  5.5 strength in other extremities    Skin: Skin is warm.    ED Course  Procedures (including critical care time) Labs Review Labs Reviewed  COMPREHENSIVE METABOLIC PANEL - Abnormal; Notable for the following:    Glucose, Bld 103 (*)    Creatinine, Ser 1.20 (*)    GFR calc non Af Amer 52 (*)    GFR calc Af Amer 60 (*)    All other components within normal limits  URINALYSIS,  ROUTINE W REFLEX MICROSCOPIC - Abnormal; Notable for the following:    APPearance CLOUDY (*)    Hgb urine dipstick TRACE (*)    All other components within normal limits  URINE MICROSCOPIC-ADD ON - Abnormal; Notable for the following:    Bacteria,  UA FEW (*)    All other components within normal limits  CBC WITH DIFFERENTIAL  PREGNANCY, URINE   Imaging Review Ct Head Wo Contrast  04/19/2013   CLINICAL DATA:  Chronic migraine headaches. Right-sided head pain.  EXAM: CT HEAD WITHOUT CONTRAST  TECHNIQUE: Contiguous axial images were obtained from the base of the skull through the vertex without intravenous contrast.  COMPARISON:  CT of the head with contrast 03/15/2013.  FINDINGS: Cavum septum pellucidum (normal anatomical variant). No acute intracranial abnormalities. Specifically, no evidence of acute intracranial hemorrhage, no definite findings of acute/subacute cerebral ischemia, no mass, mass effect, hydrocephalus or abnormal intra or extra-axial fluid collections. Visualized paranasal sinuses and mastoids are well pneumatized. No acute displaced skull fractures are identified.  IMPRESSION: 1. No acute intracranial abnormalities.   Electronically Signed   By: Trudie Reed M.D.   On: 04/19/2013 13:26    EKG Interpretation     Ventricular Rate:  63 PR Interval:  176 QRS Duration: 82 QT Interval:  428 QTC Calculation: 437 R Axis:   13 Text Interpretation:  Normal sinus rhythm Cannot rule out Anterior infarct , age undetermined Abnormal ECG No significant change was found            MDM   1. Headache   2. Loss of consciousness    History of chronic migraines with 3 day history of typical headache. Episode questionable seizure activity this morning. Seems to Dr. baseline now. No history of seizures. She has had episodes of syncope in the past with her migraine headaches. These have been extensively worked up.  Neuro exam is nonfocal. Exam is similar to previous migraine.  Denies thunderclap onset. Preceded by dizziness and typical aura. Denies fever.  Patient is at her neurological baseline.  CT head negative. Recent CTA negative. Suspect patient had convulsive syncope rather than seizure.  No history of seizure.  No tongue biting or incontinence.  She has had previous headaches with syncope.  D/w Dr. Amada Jupiter of neurology who agrees.  No thunderclap onset. No indication for LP. With possible seizure, instructed patient not to drive until seen by neurology.   Patient has been referred to headache clinic but not yet made appointment.  She is ambulatory in the ED and states she is feeling better and wanting to go home. Return precautions discussed.  [name], MD 04/19/13 [phone]

## 2013-04-26 ENCOUNTER — Other Ambulatory Visit: Payer: Self-pay

## 2013-06-28 ENCOUNTER — Encounter (HOSPITAL_BASED_OUTPATIENT_CLINIC_OR_DEPARTMENT_OTHER): Payer: Self-pay | Admitting: Emergency Medicine

## 2013-06-28 ENCOUNTER — Emergency Department (HOSPITAL_BASED_OUTPATIENT_CLINIC_OR_DEPARTMENT_OTHER)
Admission: EM | Admit: 2013-06-28 | Discharge: 2013-06-29 | Disposition: A | Discharge status: Home / Self Care | Payer: Medicaid Other | Attending: Emergency Medicine | Admitting: Emergency Medicine

## 2013-06-28 DIAGNOSIS — G43909 Migraine, unspecified, not intractable, without status migrainosus: Secondary | ICD-10-CM | POA: Insufficient documentation

## 2013-06-28 DIAGNOSIS — Z87442 Personal history of urinary calculi: Secondary | ICD-10-CM | POA: Insufficient documentation

## 2013-06-28 DIAGNOSIS — R55 Syncope and collapse: Secondary | ICD-10-CM

## 2013-06-28 DIAGNOSIS — Z79899 Other long term (current) drug therapy: Secondary | ICD-10-CM | POA: Insufficient documentation

## 2013-06-28 DIAGNOSIS — Z8719 Personal history of other diseases of the digestive system: Secondary | ICD-10-CM | POA: Insufficient documentation

## 2013-06-28 DIAGNOSIS — I10 Essential (primary) hypertension: Secondary | ICD-10-CM | POA: Insufficient documentation

## 2013-06-28 DIAGNOSIS — G8929 Other chronic pain: Secondary | ICD-10-CM | POA: Insufficient documentation

## 2013-06-28 DIAGNOSIS — R569 Unspecified convulsions: Secondary | ICD-10-CM | POA: Insufficient documentation

## 2013-06-28 LAB — CBC WITH DIFFERENTIAL/PLATELET
BASOS PCT: 0 % (ref 0–1)
Basophils Absolute: 0 10*3/uL (ref 0.0–0.1)
EOS PCT: 3 % (ref 0–5)
Eosinophils Absolute: 0.1 10*3/uL (ref 0.0–0.7)
HCT: 40.1 % (ref 36.0–46.0)
HEMOGLOBIN: 13.2 g/dL (ref 12.0–15.0)
LYMPHS ABS: 1.9 10*3/uL (ref 0.7–4.0)
Lymphocytes Relative: 41 % (ref 12–46)
MCH: 30.5 pg (ref 26.0–34.0)
MCHC: 32.9 g/dL (ref 30.0–36.0)
MCV: 92.6 fL (ref 78.0–100.0)
Monocytes Absolute: 0.5 10*3/uL (ref 0.1–1.0)
Monocytes Relative: 11 % (ref 3–12)
Neutro Abs: 2 10*3/uL (ref 1.7–7.7)
Neutrophils Relative %: 45 % (ref 43–77)
Platelets: 220 10*3/uL (ref 150–400)
RBC: 4.33 MIL/uL (ref 3.87–5.11)
RDW: 12.5 % (ref 11.5–15.5)
WBC: 4.6 10*3/uL (ref 4.0–10.5)

## 2013-06-28 LAB — COMPREHENSIVE METABOLIC PANEL
ALT: 38 U/L — ABNORMAL HIGH (ref 0–35)
AST: 26 U/L (ref 0–37)
Albumin: 3.8 g/dL (ref 3.5–5.2)
Alkaline Phosphatase: 61 U/L (ref 39–117)
BUN: 9 mg/dL (ref 6–23)
CALCIUM: 9 mg/dL (ref 8.4–10.5)
CO2: 26 mEq/L (ref 19–32)
CREATININE: 0.9 mg/dL (ref 0.50–1.10)
Chloride: 104 mEq/L (ref 96–112)
GFR calc non Af Amer: 73 mL/min — ABNORMAL LOW (ref >90)
GFR, EST AFRICAN AMERICAN: 85 mL/min — AB (ref >90)
GLUCOSE: 82 mg/dL (ref 70–99)
Potassium: 4 mEq/L (ref 3.7–5.3)
Sodium: 141 mEq/L (ref 137–147)
Total Bilirubin: 0.2 mg/dL — ABNORMAL LOW (ref 0.3–1.2)
Total Protein: 6.9 g/dL (ref 6.0–8.3)

## 2013-06-28 LAB — TROPONIN I

## 2013-06-28 MED ORDER — DIPHENHYDRAMINE HCL 50 MG/ML IJ SOLN
25.0000 mg | Freq: Once | INTRAMUSCULAR | Status: AC
  Administered 2013-06-28: 25 mg via INTRAVENOUS
  Filled 2013-06-28: qty 1

## 2013-06-28 MED ORDER — PROMETHAZINE HCL 25 MG/ML IJ SOLN
25.0000 mg | Freq: Once | INTRAMUSCULAR | Status: AC
  Administered 2013-06-28: 25 mg via INTRAVENOUS
  Filled 2013-06-28: qty 1

## 2013-06-28 MED ORDER — KETOROLAC TROMETHAMINE 30 MG/ML IJ SOLN
30.0000 mg | Freq: Once | INTRAMUSCULAR | Status: AC
  Administered 2013-06-28: 30 mg via INTRAVENOUS
  Filled 2013-06-28: qty 1

## 2013-06-28 MED ORDER — PROMETHAZINE HCL 25 MG PO TABS: 25.0000 mg | ORAL_TABLET | Freq: Four times a day (QID) | ORAL | Status: DC | PRN

## 2013-06-28 MED ORDER — HYDROMORPHONE HCL PF 1 MG/ML IJ SOLN
1.0000 mg | Freq: Once | INTRAMUSCULAR | Status: AC
Start: 2013-06-28 — End: 2013-06-28
  Administered 2013-06-28: 1 mg via INTRAVENOUS
  Filled 2013-06-28: qty 1

## 2013-06-28 MED ORDER — SODIUM CHLORIDE 0.9 % IV BOLUS (SEPSIS)
1000.0000 mL | Freq: Once | INTRAVENOUS | Status: AC
  Administered 2013-06-28: 1000 mL via INTRAVENOUS

## 2013-06-28 MED ORDER — LORAZEPAM 2 MG/ML IJ SOLN
1.0000 mg | Freq: Once | INTRAMUSCULAR | Status: AC
  Administered 2013-06-28: 1 mg via INTRAVENOUS
  Filled 2013-06-28: qty 1

## 2013-06-28 MED ORDER — METOCLOPRAMIDE HCL 5 MG/ML IJ SOLN
10.0000 mg | Freq: Once | INTRAMUSCULAR | Status: AC
  Administered 2013-06-28: 10 mg via INTRAVENOUS
  Filled 2013-06-28: qty 2

## 2013-06-28 NOTE — ED Notes (Signed)
Pt still c/o pain to rt side of face and head

## 2013-06-28 NOTE — ED Notes (Signed)
Per EMS pt was at a bookstore and had a "seizure'.  Pt states she did not, has a headache and nausea.  She has a history of pseudoseizures per pt.

## 2013-06-28 NOTE — ED Provider Notes (Signed)
CSN: [id number]     Arrival date & time 06/28/13  1821 History  This chart was scribed for Kirsten ChickMartha K Linker, MD by Ardelia Memsylan Malpass, ED Scribe. This patient was seen in room MH10/MH10 and the patient's care was started at 7:55 PM.   Chief Complaint  Patient presents with  . Seizures  . Headache  . Nausea    Patient is a 53 y.o. female presenting with migraines.  Migraine This is a new problem. The current episode started more than 2 days ago. The problem has not changed since onset.Associated symptoms include headaches. Pertinent negatives include no chest pain. Nothing aggravates the symptoms. Nothing relieves the symptoms. Treatments tried: Percocet. The treatment provided no relief.    HPI Comments: Kirsten Walsh is a 53 y.o. female who presents to the Emergency Department complaining of a constant severe migraine headache over the past 5 days. Headache was gradual in onset. She states that she has a history of similar headaches. She also states that she had a syncopal episode today- she felt lightheaded and then fainted, which she attributes to her headache. Per chart review and patietn report she has had similar episodes with headaches in the past. She also reports associated nausea and episodes of emesis. She states that she has taken Percocet without relief. She denies fever or any other symptoms. She states that her only medication allergy is Aspirin. No changes in vision or speech.   Past Medical History  Diagnosis Date  . Hypertension   . Chest pain   . Gall stones   . Migraine     "qd" (02/14/2013)  . Chronic lower back pain     "right side to mid back" (02/14/2013)  . Kidney stone ~ 2006   Past Surgical History  Procedure Laterality Date  . Laparoscopic cholecystectomy  ~ 2004  . Cystoscopy w/ stone manipulation  ~ 2006  . Intrauterine device insertion      "initially put in in 04/2002; changed prn" (02/14/2013)   Family History  Problem Relation Age of Onset  . Cancer  Mother   . Cirrhosis Father    History  Substance Use Topics  . Smoking status: Never Smoker   . Smokeless tobacco: Never Used  . Alcohol Use: Yes     Comment: 02/14/2013 "socially; couple times/month"   OB History   Grav Para Term Preterm Abortions TAB SAB Ect Mult Living                 Review of Systems  Cardiovascular: Negative for chest pain.  Neurological: Positive for headaches.  All other systems reviewed and are negative.   Allergies  Apple; Aspirin; and Carrot  Home Medications   Current Outpatient Rx  Name  Route  Sig  Dispense  Refill  . cloNIDine (CATAPRES) 0.1 MG tablet   Oral   Take 1 tablet (0.1 mg total) by mouth 2 (two) times daily.   60 tablet   1   . cyclobenzaprine (FLEXERIL) 10 MG tablet   Oral   Take 1 tablet (10 mg total) by mouth 2 (two) times daily as needed for muscle spasms. Migraine with pain > 7.   30 tablet   0   . dihydroergotamine (MIGRANAL) 4 MG/ML nasal spray   Nasal   Place 1 spray into the nose 4 (four) times a week. One spray at the onset of migraine, may repeat after 15 mins for two total doses .   16 mL   1   .  EPINEPHrine (EPIPEN) 0.3 mg/0.3 mL DEVI   Intramuscular   Inject 0.3 mg into the muscle once as needed. For allergic reaction         . ondansetron (ZOFRAN ODT) 4 MG disintegrating tablet   Oral   Take 1 tablet (4 mg total) by mouth every 6 (six) hours as needed for nausea.   30 tablet   1   . promethazine (PHENERGAN) 25 MG suppository   Rectal   Place 1 suppository (25 mg total) rectally every 6 (six) hours as needed for nausea.   12 each   0   . promethazine (PHENERGAN) 25 MG tablet   Oral   Take 1 tablet (25 mg total) by mouth every 6 (six) hours.   30 tablet   0   . promethazine (PHENERGAN) 25 MG tablet   Oral   Take 1 tablet (25 mg total) by mouth every 6 (six) hours as needed for nausea or vomiting.   20 tablet   0   . propranolol (INDERAL) 20 MG tablet   Oral   Take 1 tablet (20 mg  total) by mouth 2 (two) times daily. For BP and migraine prevention.   60 tablet   1   . protriptyline (VIVACTIL) 5 MG tablet      Start with 1 pill for first week, then two pills second week, then three pills: 8 AM, noon and 4 PM.   90 tablet   1   . senna-docusate (SENOKOT-S) 8.6-50 MG per tablet   Oral   Take 2 tablets by mouth 2 (two) times daily.   30 tablet   0   . SUMAtriptan (IMITREX) 50 MG tablet   Oral   Take 1 tablet (50 mg total) by mouth 2 (two) times a week. As needed for migraine (do not exceed 100 mg in one week).   20 tablet   0   . topiramate (TOPAMAX) 100 MG tablet   Oral   Take 1 tablet (100 mg total) by mouth 2 (two) times daily.   60 tablet   1    Triage Vitals: BP 168/88  Pulse 90  Temp(Src) 98.5 F (36.9 C) (Oral)  Resp 22  SpO2 100%  Physical Exam  Nursing note and vitals reviewed. Constitutional: She is oriented to person, place, and time. She appears well-developed and well-nourished. No distress.  HENT:  Head: Normocephalic and atraumatic.  Eyes: EOM are normal.  Neck: Neck supple. No tracheal deviation present.  Cardiovascular: Normal rate.   Pulmonary/Chest: Effort normal. No respiratory distress.  Musculoskeletal: Normal range of motion.  Neurological: She is alert and oriented to person, place, and time.  Skin: Skin is warm and dry.  Psychiatric: She has a normal mood and affect. Her behavior is normal.  note- alert and oriented x 3, cranial nerves 2-12 tested and intact, strength 5/5 in extremities x 4, sensation intact  ED Course  Procedures (including critical care time)  DIAGNOSTIC STUDIES: Oxygen Saturation is 100% on RA, normal by my interpretation.    COORDINATION OF CARE: 8:01 PM- Pt advised of plan for treatment and pt agrees.  11:28 PM on recheck patient states she is beginning to feel improved.  She states dilaudid and phenergan are the only things that help her headache, I have discussed with her that we will give  her a dose of phenergan and then she will be discharged to rest at home.  She is agreeable with this plan.   Medications  metoCLOPramide (  REGLAN) injection 10 mg (10 mg Intravenous Given 06/28/13 2038)  diphenhydrAMINE (BENADRYL) injection 25 mg (25 mg Intravenous Given 06/28/13 2041)  HYDROmorphone (DILAUDID) injection 1 mg (1 mg Intravenous Given 06/28/13 2035)  sodium chloride 0.9 % bolus 1,000 mL (0 mLs Intravenous Stopped 06/28/13 2153)  HYDROmorphone (DILAUDID) injection 1 mg (1 mg Intravenous Given 06/28/13 2213)  ketorolac (TORADOL) 30 MG/ML injection 30 mg (30 mg Intravenous Given 06/28/13 2211)  LORazepam (ATIVAN) injection 1 mg (1 mg Intravenous Given 06/28/13 2326)  promethazine (PHENERGAN) injection 25 mg (25 mg Intravenous Given 06/28/13 2326)   Labs Review Labs Reviewed  COMPREHENSIVE METABOLIC PANEL - Abnormal; Notable for the following:    ALT 38 (*)    Total Bilirubin 0.2 (*)    GFR calc non Af Amer 73 (*)    GFR calc Af Amer 85 (*)    All other components within normal limits  CBC WITH DIFFERENTIAL  TROPONIN I   Imaging Review No results found.  EKG Interpretation   None      Date: 06/28/2013  Rate: 90  Rhythm: normal sinus rhythm  QRS Axis: normal  Intervals: normal  ST/T Wave abnormalities: normal  Conduction Disutrbances:none  Narrative Interpretation: poor r wave progression  Old EKG Reviewed: unchanged EKG not able to be interpreted in MUSE   MDM   1. Syncope   2. Migraine    Pt presenting with c/o right sided headache which has been constant over the past several days, today had syncopal episode.  Per chart review she has had similar episodes with headaches.  No sudden onset of headache, no thunderclap. There was some mention of seizure activity, but sounds more c/w convulsive syncope that patient has had in the past.  No postictal period.  Pt states she has seen neurology and seizures have been ruled out.  Pt feels some improvement of headache after meds,  normal neuro exam.  Plan for discharge and follow up with her neurologist.     I personally performed the services described in this documentation, which was scribed in my presence. The recorded information has been reviewed and is accurate.    Kirsten Chick, MD 06/28/13 [phone]

## 2013-06-28 NOTE — Discharge Instructions (Signed)
Return to the ED with any concerns including weakness of arms or legs, changes in vision or speech, chest pain, difficulty breathing, recurrent fainting, vomiting and not able to keep down liquids, decreased level of alertness/lethargy, or any other alarming symptoms

## 2013-06-29 NOTE — ED Notes (Signed)
Pt waiting for ride home.

## 2013-06-29 NOTE — ED Notes (Signed)
Family has arrived to pick up pt

## 2013-07-20 ENCOUNTER — Encounter: Payer: Self-pay | Admitting: Family Medicine

## 2013-07-20 ENCOUNTER — Ambulatory Visit (INDEPENDENT_AMBULATORY_CARE_PROVIDER_SITE_OTHER): Payer: Medicaid Other | Admitting: Family Medicine

## 2013-07-20 VITALS — BP 171/100 | HR 86 | Temp 99.0°F | Wt 215.0 lb

## 2013-07-20 DIAGNOSIS — G43019 Migraine without aura, intractable, without status migrainosus: Secondary | ICD-10-CM

## 2013-07-20 DIAGNOSIS — G43901 Migraine, unspecified, not intractable, with status migrainosus: Secondary | ICD-10-CM

## 2013-07-20 MED ORDER — DIPHENHYDRAMINE HCL 50 MG PO CAPS
50.0000 mg | ORAL_CAPSULE | Freq: Once | ORAL | Status: AC
  Administered 2013-07-20: 50 mg via ORAL

## 2013-07-20 MED ORDER — VERAPAMIL HCL 80 MG PO TABS: 80.0000 mg | ORAL_TABLET | Freq: Three times a day (TID) | ORAL | Status: DC

## 2013-07-20 MED ORDER — DEXAMETHASONE SODIUM PHOSPHATE 100 MG/10ML IJ SOLN
10.0000 mg | Freq: Once | INTRAMUSCULAR | Status: AC
  Administered 2013-07-20: 10 mg via INTRAMUSCULAR

## 2013-07-20 MED ORDER — PROMETHAZINE HCL 25 MG PO TABS
25.0000 mg | ORAL_TABLET | Freq: Once | ORAL | Status: AC
  Administered 2013-07-20: 25 mg via ORAL

## 2013-07-20 NOTE — Progress Notes (Signed)
Patient ID: Kirsten Walsh, female   DOB: [birth year], 53 y.o.   MRN: [id number]  Kirsten Alar, MD Phone: [phone]  Kirsten Walsh is a 53 y.o. female who presents today for evaluation of migraine.  Migraine: patient initially states that the current episode has been going on for the past 1.5 weeks, though she then states that she always has this headache. It is right sided and accompanied by photophobia. No phonophobia. At her last hospitalization for migraine she was started on protriptyline with mild benefit. She notes no new weakness in her extremities. She notes no numbness. She notes some tingling in left fingers and toes that is not new. States this is her typical migraine pain. She has not yet been able to follow-up with the headache clinic as she does not have monetary means at this time. She states she has stopped taking the imitrex as she believes this was associated with an episode of passing out several weeks ago. Her goal is to have this HA treated in clinic and to not be hospitalized.  She has tried the following with initial good relief but now has no relief when given these medications.  Botox, Zonegram, Topamax (currently on)Imetrex, Depakote, Percocet, Vicodin, Nortriptyline, Skelaxin.    The following portions of the patient's history were reviewed and updated as appropriate: allergies, current medications, past medical history, family and social history, and problem list.  Patient is a nonsmoker.  Past Medical History  Diagnosis Date  . Hypertension   . Chest pain   . Gall stones   . Migraine     "qd" (02/14/2013)  . Chronic lower back pain     "right side to mid back" (02/14/2013)  . Kidney stone ~ 2006    History  Smoking status  . Never Smoker   Smokeless tobacco  . Never Used    Family History  Problem Relation Age of Onset  . Cancer Mother   . Cirrhosis Father     Current Outpatient Prescriptions on File Prior to Visit  Medication Sig  Dispense Refill  . cloNIDine (CATAPRES) 0.1 MG tablet Take 1 tablet (0.1 mg total) by mouth 2 (two) times daily.  60 tablet  1  . cyclobenzaprine (FLEXERIL) 10 MG tablet Take 1 tablet (10 mg total) by mouth 2 (two) times daily as needed for muscle spasms. Migraine with pain > 7.  30 tablet  0  . dihydroergotamine (MIGRANAL) 4 MG/ML nasal spray Place 1 spray into the nose 4 (four) times a week. One spray at the onset of migraine, may repeat after 15 mins for two total doses .  16 mL  1  . EPINEPHrine (EPIPEN) 0.3 mg/0.3 mL DEVI Inject 0.3 mg into the muscle once as needed. For allergic reaction      . ondansetron (ZOFRAN ODT) 4 MG disintegrating tablet Take 1 tablet (4 mg total) by mouth every 6 (six) hours as needed for nausea.  30 tablet  1  . promethazine (PHENERGAN) 25 MG suppository Place 1 suppository (25 mg total) rectally every 6 (six) hours as needed for nausea.  12 each  0  . promethazine (PHENERGAN) 25 MG tablet Take 1 tablet (25 mg total) by mouth every 6 (six) hours.  30 tablet  0  . promethazine (PHENERGAN) 25 MG tablet Take 1 tablet (25 mg total) by mouth every 6 (six) hours as needed for nausea or vomiting.  20 tablet  0  . propranolol (INDERAL) 20 MG tablet Take 1 tablet (20  mg total) by mouth 2 (two) times daily. For BP and migraine prevention.  60 tablet  1  . protriptyline (VIVACTIL) 5 MG tablet Start with 1 pill for first week, then two pills second week, then three pills: 8 AM, noon and 4 PM.  90 tablet  1  . senna-docusate (SENOKOT-S) 8.6-50 MG per tablet Take 2 tablets by mouth 2 (two) times daily.  30 tablet  0  . SUMAtriptan (IMITREX) 50 MG tablet Take 1 tablet (50 mg total) by mouth 2 (two) times a week. As needed for migraine (do not exceed 100 mg in one week).  20 tablet  0  . topiramate (TOPAMAX) 100 MG tablet Take 1 tablet (100 mg total) by mouth 2 (two) times daily.  60 tablet  1   No current facility-administered medications on file prior to visit.    ROS: Per HPI     Physical Exam Filed Vitals:   07/20/13 1352  BP: 171/100  Pulse: 86  Temp: 99 F (37.2 C)    Physical Examination: General appearance - alert, well appearing, and in no distress Chest - clear to auscultation, no wheezes, rales or rhonchi, symmetric air entry Heart - normal rate, regular rhythm, normal S1, S2, no murmurs, rubs, clicks or gallops Neurological - CN 2-12 intact, 5/5 strength in left deltoid, bicep, tricep, grip, hip flexors, quads, hamstring, plantar and dorsiflexion, 4/5 strength in right deltoid, bicep, tricep, grip, hip flexors, quads, hamstring, plantar and dorsiflexion related to pain, sensation to light touch intact in bilateral upper and lower extremities, 2+ patellar and achilles reflexes bilaterally   Assessment/Plan: Please see individual problem list.  I have spent >25 minutes in the care of this patient with >50% spent in counseling/coordination of care regarding migraine.

## 2013-07-20 NOTE — Patient Instructions (Addendum)
Nice to see you again. Sorry that you are suffering from continued headaches. Please start the verapamil for your headaches. This would help with your blood pressure as well. It is imperative that you follow-up in the headache clinic.  Migraine Headache A migraine headache is an intense, throbbing pain on one or both sides of your head. A migraine can last for 30 minutes to several hours. CAUSES  The exact cause of a migraine headache is not always known. However, a migraine may be caused when nerves in the brain become irritated and release chemicals that cause inflammation. This causes pain. Certain things may also trigger migraines, such as:  Alcohol.  Smoking.  Stress.  Menstruation.  Aged cheeses.  Foods or drinks that contain nitrates, glutamate, aspartame, or tyramine.  Lack of sleep.  Chocolate.  Caffeine.  Hunger.  Physical exertion.  Fatigue.  Medicines used to treat chest pain (nitroglycerine), birth control pills, estrogen, and some blood pressure medicines. SIGNS AND SYMPTOMS  Pain on one or both sides of your head.  Pulsating or throbbing pain.  Severe pain that prevents daily activities.  Pain that is aggravated by any physical activity.  Nausea, vomiting, or both.  Dizziness.  Pain with exposure to bright lights, loud noises, or activity.  General sensitivity to bright lights, loud noises, or smells. Before you get a migraine, you may get warning signs that a migraine is coming (aura). An aura may include:  Seeing flashing lights.  Seeing bright spots, halos, or zig-zag lines.  Having tunnel vision or blurred vision.  Having feelings of numbness or tingling.  Having trouble talking.  Having muscle weakness. DIAGNOSIS  A migraine headache is often diagnosed based on:  Symptoms.  Physical exam.  A CT scan or MRI of your head. These imaging tests cannot diagnose migraines, but they can help rule out other causes of  headaches. TREATMENT Medicines may be given for pain and nausea. Medicines can also be given to help prevent recurrent migraines.  HOME CARE INSTRUCTIONS  Only take over-the-counter or prescription medicines for pain or discomfort as directed by your health care provider. The use of long-term narcotics is not recommended.  Lie down in a dark, quiet room when you have a migraine.  Keep a journal to find out what may trigger your migraine headaches. For example, write down:  What you eat and drink.  How much sleep you get.  Any change to your diet or medicines.  Limit alcohol consumption.  Quit smoking if you smoke.  Get 7 9 hours of sleep, or as recommended by your health care provider.  Limit stress.  Keep lights dim if bright lights bother you and make your migraines worse. SEEK IMMEDIATE MEDICAL CARE IF:   Your migraine becomes severe.  You have a fever.  You have a stiff neck.  You have vision loss.  You have muscular weakness or loss of muscle control.  You start losing your balance or have trouble walking.  You feel faint or pass out.  You have severe symptoms that are different from your first symptoms. MAKE SURE YOU:   Understand these instructions.  Will watch your condition.  Will get help right away if you are not doing well or get worse. Document Released: 06/07/2005 Document Revised: 03/28/2013 Document Reviewed: 02/12/2013 [facility] Patient Information 2014 [place], [place].

## 2013-07-22 MED ORDER — PROTRIPTYLINE HCL 5 MG PO TABS: ORAL_TABLET | ORAL | Status: DC

## 2013-07-22 MED ORDER — ONDANSETRON 4 MG PO TBDP: 4.0000 mg | ORAL_TABLET | Freq: Four times a day (QID) | ORAL | Status: DC | PRN

## 2013-07-22 MED ORDER — PROPRANOLOL HCL 20 MG PO TABS: 20.0000 mg | ORAL_TABLET | Freq: Two times a day (BID) | ORAL | Status: DC

## 2013-07-22 MED ORDER — PROMETHAZINE HCL 25 MG RE SUPP: 25.0000 mg | Freq: Four times a day (QID) | RECTAL | Status: DC | PRN

## 2013-07-22 NOTE — Assessment & Plan Note (Addendum)
Patient with migraine in clinic. Will give decadron, benadryl, and phenergan for acute treatment of migraine. Patient has been on multiple therapies previously all of minimal long term benefit. She would like a referral to a new pain clinic for evaluation of treatment of her headaches. Will add verapamil to her current regimen. Patient to f/u in one month.

## 2013-07-23 ENCOUNTER — Telehealth: Payer: Self-pay | Admitting: Family Medicine

## 2013-07-23 DIAGNOSIS — G43909 Migraine, unspecified, not intractable, without status migrainosus: Secondary | ICD-10-CM

## 2013-07-23 NOTE — Telephone Encounter (Signed)
Depression screen was a 7 She has severe migraine She is not suicidal. Headache clinic doesn't receive medicaid. Dr said he would refer to neurologist Status of referral? Please advise [facility]  [phone]

## 2013-07-24 NOTE — Telephone Encounter (Signed)
Referral placed.

## 2013-07-27 ENCOUNTER — Telehealth: Payer: Self-pay | Admitting: Family Medicine

## 2013-07-27 NOTE — Telephone Encounter (Signed)
Patient states she would like to speak to nurse pertaining to a recent referral that was placed.

## 2013-07-30 NOTE — Telephone Encounter (Signed)
[facility]erral placed [facility] will contact pt. It was by pt request.  [place]

## 2013-07-30 NOTE — Telephone Encounter (Signed)
Spoke with patient and she has an Production manageroutstanding bill with [facility] and she needs us to send her to a different neurologist.  Informed her that her information was sent to pain clinic and that they would be contacting her regarding an appt. Jazmin Hartsell,CMA

## 2013-08-08 ENCOUNTER — Ambulatory Visit: Payer: Medicaid Other | Admitting: Neurology

## 2013-08-20 ENCOUNTER — Emergency Department (HOSPITAL_BASED_OUTPATIENT_CLINIC_OR_DEPARTMENT_OTHER)
Admission: EM | Admit: 2013-08-20 | Discharge: 2013-08-20 | Disposition: A | Discharge status: Home / Self Care | Payer: Medicaid Other | Attending: Emergency Medicine | Admitting: Emergency Medicine

## 2013-08-20 ENCOUNTER — Encounter (HOSPITAL_BASED_OUTPATIENT_CLINIC_OR_DEPARTMENT_OTHER): Payer: Self-pay | Admitting: Emergency Medicine

## 2013-08-20 ENCOUNTER — Emergency Department (HOSPITAL_BASED_OUTPATIENT_CLINIC_OR_DEPARTMENT_OTHER): Payer: Medicaid Other

## 2013-08-20 ENCOUNTER — Other Ambulatory Visit: Payer: Self-pay

## 2013-08-20 DIAGNOSIS — Z79899 Other long term (current) drug therapy: Secondary | ICD-10-CM | POA: Insufficient documentation

## 2013-08-20 DIAGNOSIS — G8929 Other chronic pain: Secondary | ICD-10-CM | POA: Insufficient documentation

## 2013-08-20 DIAGNOSIS — G43909 Migraine, unspecified, not intractable, without status migrainosus: Secondary | ICD-10-CM | POA: Insufficient documentation

## 2013-08-20 DIAGNOSIS — Y9289 Other specified places as the place of occurrence of the external cause: Secondary | ICD-10-CM | POA: Insufficient documentation

## 2013-08-20 DIAGNOSIS — R51 Headache: Secondary | ICD-10-CM

## 2013-08-20 DIAGNOSIS — R55 Syncope and collapse: Secondary | ICD-10-CM | POA: Insufficient documentation

## 2013-08-20 DIAGNOSIS — Z8719 Personal history of other diseases of the digestive system: Secondary | ICD-10-CM | POA: Insufficient documentation

## 2013-08-20 DIAGNOSIS — S0990XA Unspecified injury of head, initial encounter: Secondary | ICD-10-CM | POA: Insufficient documentation

## 2013-08-20 DIAGNOSIS — Y9389 Activity, other specified: Secondary | ICD-10-CM | POA: Insufficient documentation

## 2013-08-20 DIAGNOSIS — R42 Dizziness and giddiness: Secondary | ICD-10-CM | POA: Insufficient documentation

## 2013-08-20 DIAGNOSIS — R519 Headache, unspecified: Secondary | ICD-10-CM

## 2013-08-20 DIAGNOSIS — I10 Essential (primary) hypertension: Secondary | ICD-10-CM | POA: Insufficient documentation

## 2013-08-20 DIAGNOSIS — IMO0002 Reserved for concepts with insufficient information to code with codable children: Secondary | ICD-10-CM | POA: Insufficient documentation

## 2013-08-20 DIAGNOSIS — Z87442 Personal history of urinary calculi: Secondary | ICD-10-CM | POA: Insufficient documentation

## 2013-08-20 MED ORDER — DEXAMETHASONE SODIUM PHOSPHATE 10 MG/ML IJ SOLN
10.0000 mg | Freq: Once | INTRAMUSCULAR | Status: AC
  Administered 2013-08-20: 10 mg via INTRAVENOUS
  Filled 2013-08-20: qty 1

## 2013-08-20 MED ORDER — HYDROMORPHONE HCL PF 1 MG/ML IJ SOLN
1.0000 mg | Freq: Once | INTRAMUSCULAR | Status: AC
  Administered 2013-08-20: 1 mg via INTRAVENOUS
  Filled 2013-08-20: qty 1

## 2013-08-20 MED ORDER — METOCLOPRAMIDE HCL 5 MG/ML IJ SOLN
10.0000 mg | Freq: Once | INTRAMUSCULAR | Status: AC
  Administered 2013-08-20: 10 mg via INTRAVENOUS
  Filled 2013-08-20: qty 2

## 2013-08-20 MED ORDER — DIPHENHYDRAMINE HCL 50 MG/ML IJ SOLN
25.0000 mg | Freq: Once | INTRAMUSCULAR | Status: AC
  Administered 2013-08-20: 25 mg via INTRAVENOUS
  Filled 2013-08-20: qty 1

## 2013-08-20 MED ORDER — SODIUM CHLORIDE 0.9 % IV BOLUS (SEPSIS)
1000.0000 mL | Freq: Once | INTRAVENOUS | Status: AC
  Administered 2013-08-20: 1000 mL via INTRAVENOUS

## 2013-08-20 MED ORDER — PROMETHAZINE HCL 25 MG/ML IJ SOLN
25.0000 mg | Freq: Once | INTRAMUSCULAR | Status: AC
  Administered 2013-08-20: 25 mg via INTRAVENOUS
  Filled 2013-08-20: qty 1

## 2013-08-20 NOTE — Discharge Instructions (Signed)
Migraine Headache A migraine headache is an intense, throbbing pain on one or both sides of your head. A migraine can last for 30 minutes to several hours. CAUSES  The exact cause of a migraine headache is not always known. However, a migraine may be caused when nerves in the brain become irritated and release chemicals that cause inflammation. This causes pain. Certain things may also trigger migraines, such as:  Alcohol.  Smoking.  Stress.  Menstruation.  Aged cheeses.  Foods or drinks that contain nitrates, glutamate, aspartame, or tyramine.  Lack of sleep.  Chocolate.  Caffeine.  Hunger.  Physical exertion.  Fatigue.  Medicines used to treat chest pain (nitroglycerine), birth control pills, estrogen, and some blood pressure medicines. SIGNS AND SYMPTOMS  Pain on one or both sides of your head.  Pulsating or throbbing pain.  Severe pain that prevents daily activities.  Pain that is aggravated by any physical activity.  Nausea, vomiting, or both.  Dizziness.  Pain with exposure to bright lights, loud noises, or activity.  General sensitivity to bright lights, loud noises, or smells. Before you get a migraine, you may get warning signs that a migraine is coming (aura). An aura may include:  Seeing flashing lights.  Seeing bright spots, halos, or zig-zag lines.  Having tunnel vision or blurred vision.  Having feelings of numbness or tingling.  Having trouble talking.  Having muscle weakness. DIAGNOSIS  A migraine headache is often diagnosed based on:  Symptoms.  Physical exam.  A CT scan or MRI of your head. These imaging tests cannot diagnose migraines, but they can help rule out other causes of headaches. TREATMENT Medicines may be given for pain and nausea. Medicines can also be given to help prevent recurrent migraines.  HOME CARE INSTRUCTIONS  Only take over-the-counter or prescription medicines for pain or discomfort as directed by your  health care provider. The use of long-term narcotics is not recommended.  Lie down in a dark, quiet room when you have a migraine.  Keep a journal to find out what may trigger your migraine headaches. For example, write down:  What you eat and drink.  How much sleep you get.  Any change to your diet or medicines.  Limit alcohol consumption.  Quit smoking if you smoke.  Get 7 9 hours of sleep, or as recommended by your health care provider.  Limit stress.  Keep lights dim if bright lights bother you and make your migraines worse. SEEK IMMEDIATE MEDICAL CARE IF:   Your migraine becomes severe.  You have a fever.  You have a stiff neck.  You have vision loss.  You have muscular weakness or loss of muscle control.  You start losing your balance or have trouble walking.  You feel faint or pass out.  You have severe symptoms that are different from your first symptoms. MAKE SURE YOU:   Understand these instructions.  Will watch your condition.  Will get help right away if you are not doing well or get worse. Document Released: 06/07/2005 Document Revised: 03/28/2013 Document Reviewed: 02/12/2013 [employer] Patient Information 2014 [employer].  
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
## 2013-08-20 NOTE — ED Notes (Signed)
Pt sleeping with resp easy and reg, arouses to tactile stim to report ha is "better". Pt assisted to walk to desk with cg@, gait steady and strong. Pt given can of sprite per request.
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
## 2013-09-14 ENCOUNTER — Ambulatory Visit (INDEPENDENT_AMBULATORY_CARE_PROVIDER_SITE_OTHER): Payer: Medicaid Other | Admitting: Neurology

## 2013-09-14 ENCOUNTER — Encounter: Payer: Self-pay | Admitting: Neurology

## 2013-09-14 VITALS — BP 145/90 | HR 78 | Temp 98.5°F | Ht 65.0 in | Wt 222.0 lb

## 2013-09-14 DIAGNOSIS — G43709 Chronic migraine without aura, not intractable, without status migrainosus: Secondary | ICD-10-CM | POA: Insufficient documentation

## 2013-09-14 DIAGNOSIS — IMO0002 Reserved for concepts with insufficient information to code with codable children: Secondary | ICD-10-CM

## 2013-09-14 MED ORDER — TOPIRAMATE 100 MG PO TABS: ORAL_TABLET | ORAL | Status: DC

## 2013-09-14 MED ORDER — FLURBIPROFEN 50 MG PO TABS: ORAL_TABLET | ORAL | Status: DC

## 2013-09-14 MED ORDER — PROPRANOLOL HCL 20 MG PO TABS: ORAL_TABLET | ORAL | Status: DC
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
## 2013-09-14 NOTE — Patient Instructions (Signed)
1. Increase Topamax 100mg  tabs to 1-1/2 tabs twice a day 2. Increase Propranolol 20mg  tabs to: 1 tab in AM, 2 tabs in PM for 1 week, then increase to 2 tabs twice a day 3. Start daily magnesium 400mg  tabs and Riboflavin (B2) 400mg  supplements 4. Start melatonin 3mg  at bedtime to help with sleep 5. Minimize use of rescue medications (Imitrex, Ansaid) to 2-3 times a week to help avoid Rebound Headaches. 6. Use Imitrex for severe headaches, can take Ansaid 50mg  twice a day as needed for moderate headaches. Do not use more than 3 times a week. 7. We will look into Botox 8. Follow-up in 3 months

## 2013-09-15 ENCOUNTER — Encounter: Payer: Self-pay | Admitting: Neurology

## 2013-09-25 ENCOUNTER — Telehealth: Payer: Self-pay | Admitting: *Deleted

## 2013-09-25 NOTE — Telephone Encounter (Signed)
Dr. Tollie EthPlummer ([facility]) called and wanted to discuss with someone at PCPs office his visit on Friday.  Dr. Tollie EthPlummer states that given pts strong family history of migraines he asked pt if she had ever been tested for blood dyscrasia or auto immune diseases.  Pt states that to her knowledge she never has.  Advised that glancing thru her chart I did not see any evidence of this.   Dr. Felipa EmoryPlummers recommendation is that these be done, as this is something he cant order.   They will fax over note as soon as it is transcribed. Aubriee Szeto, Maryjo RochesterJessica Dawn

## 2013-11-13 ENCOUNTER — Encounter: Payer: Self-pay | Admitting: Clinical

## 2013-11-13 NOTE — Progress Notes (Signed)
CSW received documentation from [facility] with [facility] regarding pt goals: STG: Short term goals/ LTG: Long term goals  Personal goals: I want to graduate college.  STG:Pt will be referred to a pain management clinic per her PCP within the next month. LTG: Pt will attend an appt @ a pain management clinic within the next 2 months. Pt needs pain management for migraines.  STG: PT will be linked with [facility] dietician within the next month. LTG: Pt will receive initial nutritional counseling within next 2 months. Pt needs information about how to manage her HTN.  STG: Pt will schedule a follow-up appt. With her PCP within the next 2 weeks. LTG: Pt will attend a follow up with her PCP within the next month.

## 2013-12-03 ENCOUNTER — Ambulatory Visit: Payer: Medicaid Other | Admitting: Family Medicine

## 2013-12-05 ENCOUNTER — Telehealth: Payer: Self-pay | Admitting: Family Medicine

## 2013-12-05 NOTE — Telephone Encounter (Signed)
[facility]  She want any visits and labs that she have with us be shared with the [facility].  Their fax # [phone].

## 2013-12-05 NOTE — Telephone Encounter (Signed)
Will forward to Dr. Mikel CellaHairford so she is aware. Jazmin Hartsell,CMA

## 2013-12-05 NOTE — Telephone Encounter (Signed)
Kesha from [facility] calls again, Dr. Tollie EthPlummer would like labs drawn for auto immune disorder and blood dycrasia when patient comes in for appt 6/18 w/ Dr. Mikel CellaHairford. Please refer to Telephone Message from 09/25/13 in EPIC.

## 2013-12-06 ENCOUNTER — Other Ambulatory Visit: Payer: Self-pay

## 2013-12-06 ENCOUNTER — Ambulatory Visit (INDEPENDENT_AMBULATORY_CARE_PROVIDER_SITE_OTHER): Payer: Medicaid Other | Admitting: Family Medicine

## 2013-12-06 ENCOUNTER — Emergency Department (HOSPITAL_COMMUNITY): Payer: Medicaid Other

## 2013-12-06 ENCOUNTER — Encounter: Payer: Self-pay | Admitting: Family Medicine

## 2013-12-06 ENCOUNTER — Encounter (HOSPITAL_COMMUNITY): Payer: Self-pay | Admitting: Emergency Medicine

## 2013-12-06 ENCOUNTER — Emergency Department (HOSPITAL_COMMUNITY)
Admission: EM | Admit: 2013-12-06 | Discharge: 2013-12-06 | Disposition: A | Discharge status: Home / Self Care | Payer: Medicaid Other | Attending: Emergency Medicine | Admitting: Emergency Medicine

## 2013-12-06 VITALS — BP 150/90 | HR 100 | Temp 98.5°F | Wt 222.0 lb

## 2013-12-06 DIAGNOSIS — IMO0002 Reserved for concepts with insufficient information to code with codable children: Secondary | ICD-10-CM

## 2013-12-06 DIAGNOSIS — Y929 Unspecified place or not applicable: Secondary | ICD-10-CM | POA: Insufficient documentation

## 2013-12-06 DIAGNOSIS — G8929 Other chronic pain: Secondary | ICD-10-CM | POA: Insufficient documentation

## 2013-12-06 DIAGNOSIS — Z87442 Personal history of urinary calculi: Secondary | ICD-10-CM | POA: Insufficient documentation

## 2013-12-06 DIAGNOSIS — R55 Syncope and collapse: Secondary | ICD-10-CM | POA: Insufficient documentation

## 2013-12-06 DIAGNOSIS — I10 Essential (primary) hypertension: Secondary | ICD-10-CM | POA: Insufficient documentation

## 2013-12-06 DIAGNOSIS — G43019 Migraine without aura, intractable, without status migrainosus: Secondary | ICD-10-CM

## 2013-12-06 DIAGNOSIS — G43709 Chronic migraine without aura, not intractable, without status migrainosus: Secondary | ICD-10-CM

## 2013-12-06 DIAGNOSIS — Z8719 Personal history of other diseases of the digestive system: Secondary | ICD-10-CM | POA: Insufficient documentation

## 2013-12-06 DIAGNOSIS — W1809XA Striking against other object with subsequent fall, initial encounter: Secondary | ICD-10-CM | POA: Insufficient documentation

## 2013-12-06 DIAGNOSIS — Y9301 Activity, walking, marching and hiking: Secondary | ICD-10-CM | POA: Insufficient documentation

## 2013-12-06 DIAGNOSIS — Z79899 Other long term (current) drug therapy: Secondary | ICD-10-CM | POA: Insufficient documentation

## 2013-12-06 DIAGNOSIS — Z043 Encounter for examination and observation following other accident: Secondary | ICD-10-CM | POA: Insufficient documentation

## 2013-12-06 LAB — CBC WITH DIFFERENTIAL/PLATELET
Basophils Absolute: 0 10*3/uL (ref 0.0–0.1)
Basophils Relative: 1 % (ref 0–1)
EOS ABS: 0.1 10*3/uL (ref 0.0–0.7)
Eosinophils Relative: 4 % (ref 0–5)
HCT: 41 % (ref 36.0–46.0)
HEMOGLOBIN: 13.9 g/dL (ref 12.0–15.0)
LYMPHS ABS: 1.3 10*3/uL (ref 0.7–4.0)
LYMPHS PCT: 43 % (ref 12–46)
MCH: 30.5 pg (ref 26.0–34.0)
MCHC: 33.9 g/dL (ref 30.0–36.0)
MCV: 90.1 fL (ref 78.0–100.0)
MONOS PCT: 9 % (ref 3–12)
Monocytes Absolute: 0.3 10*3/uL (ref 0.1–1.0)
NEUTROS ABS: 1.3 10*3/uL — AB (ref 1.7–7.7)
Neutrophils Relative %: 43 % (ref 43–77)
PLATELETS: 244 10*3/uL (ref 150–400)
RBC: 4.55 MIL/uL (ref 3.87–5.11)
RDW: 14.3 % (ref 11.5–15.5)
WBC: 3.1 10*3/uL — ABNORMAL LOW (ref 4.0–10.5)

## 2013-12-06 LAB — POCT SEDIMENTATION RATE: POCT SED RATE: 8 mm/h (ref 0–22)

## 2013-12-06 LAB — I-STAT CHEM 8, ED
BUN: 5 mg/dL — AB (ref 6–23)
CALCIUM ION: 1.27 mmol/L — AB (ref 1.12–1.23)
Chloride: 105 mEq/L (ref 96–112)
Creatinine, Ser: 1.1 mg/dL (ref 0.50–1.10)
Glucose, Bld: 102 mg/dL — ABNORMAL HIGH (ref 70–99)
HEMATOCRIT: 45 % (ref 36.0–46.0)
Hemoglobin: 15.3 g/dL — ABNORMAL HIGH (ref 12.0–15.0)
POTASSIUM: 3.9 meq/L (ref 3.7–5.3)
SODIUM: 142 meq/L (ref 137–147)
TCO2: 21 mmol/L (ref 0–100)

## 2013-12-06 MED ORDER — METHYLPREDNISOLONE ACETATE 40 MG/ML IJ SUSP
40.0000 mg | Freq: Once | INTRAMUSCULAR | Status: AC
  Administered 2013-12-06: 40 mg via INTRAMUSCULAR

## 2013-12-06 MED ORDER — METOCLOPRAMIDE HCL 5 MG/ML IJ SOLN
10.0000 mg | Freq: Once | INTRAMUSCULAR | Status: AC
  Administered 2013-12-06: 10 mg via INTRAVENOUS
  Filled 2013-12-06: qty 2

## 2013-12-06 MED ORDER — ONDANSETRON HCL 4 MG/2ML IJ SOLN
4.0000 mg | Freq: Once | INTRAMUSCULAR | Status: AC
  Administered 2013-12-06: 4 mg via INTRAMUSCULAR

## 2013-12-06 MED ORDER — DIPHENHYDRAMINE HCL 50 MG/ML IJ SOLN
12.5000 mg | Freq: Once | INTRAMUSCULAR | Status: AC
  Administered 2013-12-06: 12.5 mg via INTRAVENOUS
  Filled 2013-12-06: qty 1

## 2013-12-06 MED ORDER — KETOROLAC TROMETHAMINE 30 MG/ML IJ SOLN
30.0000 mg | Freq: Once | INTRAMUSCULAR | Status: DC
  Filled 2013-12-06: qty 1

## 2013-12-06 MED ORDER — PROCHLORPERAZINE EDISYLATE 5 MG/ML IJ SOLN
10.0000 mg | Freq: Once | INTRAMUSCULAR | Status: DC
  Filled 2013-12-06: qty 2

## 2013-12-06 MED ORDER — PROCHLORPERAZINE EDISYLATE 5 MG/ML IJ SOLN
10.0000 mg | Freq: Four times a day (QID) | INTRAMUSCULAR | Status: DC | PRN
  Administered 2013-12-06: 10 mg via INTRAVENOUS
  Filled 2013-12-06: qty 2

## 2013-12-06 MED ORDER — FENTANYL CITRATE 0.05 MG/ML IJ SOLN
100.0000 ug | Freq: Once | INTRAMUSCULAR | Status: AC
  Administered 2013-12-06: 100 ug via INTRAVENOUS
  Filled 2013-12-06: qty 2

## 2013-12-06 MED ORDER — ONDANSETRON HCL 4 MG/2ML IJ SOLN
4.0000 mg | Freq: Once | INTRAMUSCULAR | Status: AC
  Administered 2013-12-06: 4 mg via INTRAVENOUS
  Filled 2013-12-06: qty 2

## 2013-12-06 MED ORDER — FENTANYL CITRATE 0.05 MG/ML IJ SOLN
50.0000 ug | Freq: Once | INTRAMUSCULAR | Status: AC
  Administered 2013-12-06: 50 ug via INTRAVENOUS
  Filled 2013-12-06: qty 2

## 2013-12-06 MED ORDER — DIPHENHYDRAMINE HCL 50 MG/ML IJ SOLN
25.0000 mg | Freq: Once | INTRAMUSCULAR | Status: AC
  Administered 2013-12-06: 25 mg via INTRAMUSCULAR

## 2013-12-06 NOTE — ED Provider Notes (Signed)
Pt received in sign out from Dr. Radford PaxBeaton at shift change.  53 y.o. F with hx of migraines presenting to the ED after a fall.  Pt was seen by her PCP earlier today for a migraine headache.  States she was walking with a friend when gait became unsteady and she fell to the floor hitting her head from a standing position and hit her head.  No neck pain.  On exam, pt was noted to be without focal deficits, however after ambulating to bathroom in the ED, pt stated she felt weak.  On review of medical records, pt has had multiple head CTs and MRIs over the past few months.  Pt states she does see neurology but cannot recall the name of her physician.  Plan:  CT head pending.  If negative, anticipate discharge home.  Results for orders placed during the hospital encounter of 12/06/13  I-STAT CHEM 8, ED      Result Value Ref Range   Sodium 142  137 - 147 mEq/L   Potassium 3.9  3.7 - 5.3 mEq/L   Chloride 105  96 - 112 mEq/L   BUN 5 (*) 6 - 23 mg/dL   Creatinine, Ser 1.611.10  0.50 - 1.10 mg/dL   Glucose, Bld 096102 (*) 70 - 99 mg/dL   Calcium, Ion 0.451.27 (*) 1.12 - 1.23 mmol/L   TCO2 21  0 - 100 mmol/L   Hemoglobin 15.3 (*) 12.0 - 15.0 g/dL   HCT 40.945.0  81.136.0 - 91.446.0 %   Ct Head Wo Contrast  12/06/2013   CLINICAL DATA:  Headache  EXAM: CT HEAD WITHOUT CONTRAST  TECHNIQUE: Contiguous axial images were obtained from the base of the skull through the vertex without intravenous contrast.  COMPARISON:  Prior MRI from 11/27/2013  FINDINGS: There is no acute intracranial hemorrhage or infarct. No mass lesion or midline shift. Gray-white matter differentiation is well maintained. Ventricles are normal in size without evidence of hydrocephalus. CSF containing spaces are within normal limits. No extra-axial fluid collection.  The calvarium is intact.  Orbital soft tissues are within normal limits.  The paranasal sinuses and mastoid air cells are well pneumatized and free of fluid.  Scalp soft tissues are unremarkable.   IMPRESSION: Normal head CT with no acute intracranial abnormality identified.   Electronically Signed   By: Rise MuBenjamin  McClintock M.D.   On: 12/06/2013 22:15   Head CT negative for acute findings.  Pt was given additional doses of fentanyl and reglan.  States she continues to have a headache but is ready to go home.  On my brief neurologic exam, she is baseline oriented and has no focal deficits.  She will continue her home meds and FU with her PCP.  Discussed plan with patient, he/she acknowledged understanding and agreed with plan of care.  Return precautions given for new or worsening symptoms.  Garlon HatchetLisa M Sanders, PA-C 12/07/13 0004

## 2013-12-06 NOTE — ED Provider Notes (Signed)
CSN: [id number]     Arrival date & time 12/06/13  1416 History   First MD Initiated Contact with Patient 12/06/13 1453     Chief Complaint  Patient presents with  . Near Syncope  . Fall      [facility], per report pt was walking when visiting with a friend & her gait became unsteady & she fell from a standing position to the floor hitting her head, pt denies neck & back pain, pt was seen at PCP for migraine pain today & nausea, pt rcvd 4 mg Zofran PTA, A&O x 4, follows commands, speaks in complete sentences  Past Medical History  Diagnosis Date  . Hypertension   . Chest pain   . Gall stones   . Migraine     "qd" (02/14/2013)  . Chronic lower back pain     "right side to mid back" (02/14/2013)  . Kidney stone ~ 2006   Past Surgical History  Procedure Laterality Date  . Laparoscopic cholecystectomy  ~ 2004  . Cystoscopy w/ stone manipulation  ~ 2006  . Intrauterine device insertion      "initially put in in 04/2002; changed prn" (02/14/2013)   Family History  Problem Relation Age of Onset  . Cancer Mother   . Cirrhosis Father    History  Substance Use Topics  . Smoking status: Never Smoker   . Smokeless tobacco: Never Used  . Alcohol Use: Yes     Comment: 02/14/2013 "socially; couple times/month"   OB History   Grav Para Term Preterm Abortions TAB SAB Ect Mult Living                 Review of Systems  All other systems reviewed and are negative  Allergies  Apple; Aspirin; and Carrot  Home Medications   Prior to Admission medications   Medication Sig Start Date End Date Taking? Authorizing Provider  cloNIDine (CATAPRES) 0.1 MG tablet Take 1 tablet (0.1 mg total) by mouth 2 (two) times daily. 03/28/13  Yes [name], MD  cyclobenzaprine (FLEXERIL) 10 MG tablet Take 1 tablet (10 mg total) by mouth 2 (two) times daily as needed for muscle spasms. Migraine with pain > 7. 03/16/13  Yes Josalyn C Funches, MD  flurbiprofen (ANSAID) 50 MG tablet Take  twice a day as needed for headache. Do not take more than 3 times a week 09/14/13  Yes Van ClinesKaren M Aquino, MD  ondansetron (ZOFRAN ODT) 4 MG disintegrating tablet Take 1 tablet (4 mg total) by mouth every 6 (six) hours as needed for nausea. 07/22/13  Yes [name], MD  promethazine (PHENERGAN) 25 MG suppository Place 1 suppository (25 mg total) rectally every 6 (six) hours as needed for nausea. 07/22/13  Yes [name], MD  promethazine (PHENERGAN) 25 MG tablet Take 1 tablet (25 mg total) by mouth every 6 (six) hours as needed for nausea or vomiting. 06/28/13  Yes Ethelda ChickMartha K Linker, MD  propranolol (INDERAL) 20 MG tablet Take 20 mg by mouth 2 (two) times daily.   Yes Historical Provider, MD  protriptyline (VIVACTIL) 5 MG tablet Take 5 mg by mouth 2 (two) times daily.   Yes Historical Provider, MD  sennosides-docusate sodium (SENOKOT-S) 8.6-50 MG tablet Take 1 tablet by mouth daily as needed for constipation.   Yes Historical Provider, MD  SUMAtriptan (IMITREX) 50 MG tablet Take 1 tablet (50 mg total) by mouth 2 (two) times a week. As needed for migraine (  do not exceed 100 mg in one week). 03/22/13  Yes [name], MD  topiramate (TOPAMAX) 100 MG tablet Take 150 mg by mouth 2 (two) times daily.   Yes Historical Provider, MD  verapamil (CALAN) 80 MG tablet Take 1 tablet (80 mg total) by mouth 3 (three) times daily. 07/20/13  Yes [name], MD   BP 124/80  Pulse 72  Temp(Src) 99.2 F (37.3 C) (Oral)  Resp 18  SpO2 100% Physical Exam  Nursing note and vitals reviewed. Constitutional: She is oriented to person, place, and time. She appears well-developed and well-nourished. No distress.  HENT:  Head: Normocephalic and atraumatic.  Eyes: Pupils are equal, round, and reactive to light.  Neck: Normal range of motion.  Cardiovascular: Normal rate and intact distal pulses.   Pulmonary/Chest: No respiratory distress.  Abdominal: Normal appearance. She exhibits no distension.   Musculoskeletal: Normal range of motion.  Neurological: She is alert and oriented to person, place, and time. No cranial nerve deficit or sensory deficit. GCS eye subscore is 4. GCS verbal subscore is 5. GCS motor subscore is 6.  Skin: Skin is warm and dry. No rash noted.  Psychiatric: She has a normal mood and affect. Her behavior is normal.    ED Course  Procedures (including critical care time)  Medications  ondansetron (ZOFRAN) injection 4 mg (4 mg Intravenous Given 12/06/13 1517)  fentaNYL (SUBLIMAZE) injection 100 mcg (100 mcg Intravenous Given 12/06/13 1651)  diphenhydrAMINE (BENADRYL) injection 12.5 mg (12.5 mg Intravenous Given 12/06/13 1847)  fentaNYL (SUBLIMAZE) injection 50 mcg (50 mcg Intravenous Given 12/06/13 2031)  metoCLOPramide (REGLAN) injection 10 mg (10 mg Intravenous Given 12/06/13 2246)     After treatment patient got up to go to bathroom according to the nurse shot she had some right-sided weakness.  Patient also felt weak on the right side.  Plan do CT scan.  Physical exam redundant not show any drift or abnormality. Labs Review Labs Reviewed  I-STAT CHEM 8, ED - Abnormal; Notable for the following:    BUN 5 (*)    Glucose, Bld 102 (*)    Calcium, Ion 1.27 (*)    Hemoglobin 15.3 (*)    All other components within normal limits    Imaging Review Ct Head Wo Contrast  12/06/2013   CLINICAL DATA:  Headache  EXAM: CT HEAD WITHOUT CONTRAST  TECHNIQUE: Contiguous axial images were obtained from the base of the skull through the vertex without intravenous contrast.  COMPARISON:  Prior MRI from 11/27/2013  FINDINGS: There is no acute intracranial hemorrhage or infarct. No mass lesion or midline shift. Gray-white matter differentiation is well maintained. Ventricles are normal in size without evidence of hydrocephalus. CSF containing spaces are within normal limits. No extra-axial fluid collection.  The calvarium is intact.  Orbital soft tissues are within normal limits.   The paranasal sinuses and mastoid air cells are well pneumatized and free of fluid.  Scalp soft tissues are unremarkable.  IMPRESSION: Normal head CT with no acute intracranial abnormality identified.   Electronically Signed   By: Rise MuBenjamin  McClintock M.D.   On: 12/06/2013 22:15     Date: 12/06/2013  Rate: 74  Rhythm: normal sinus rhythm  QRS Axis: normal  Intervals: normal  ST/T Wave abnormalities: normal  Conduction Disutrbances: none  Narrative Interpretation: unremarkable      MDM   Final diagnoses:  Chronic migraine        Nelia Shiobert L Beaton, MD 12/07/13 [phone]

## 2013-12-06 NOTE — ED Notes (Signed)
Pt in via [facility], per report pt was walking when visiting with a friend & her gait became unsteady & she fell from a standing position to the floor hitting her head, pt denies neck & back pain, pt was seen at PCP for migraine pain today & nausea, pt rcvd 4 mg Zofran PTA, A&O x 4, follows commands, speaks in complete sentences

## 2013-12-06 NOTE — ED Notes (Signed)
Called CT to see wait time.  Patient will be picked up soon.

## 2013-12-06 NOTE — Patient Instructions (Signed)
I will let you know what your labs show. Please follow up with Dr. Birdie SonsSonnenberg in a few weeks.  I am sorry you are still in so much pain.  Lonnie Reth M. Shakina Choy, M.D.

## 2013-12-06 NOTE — ED Notes (Signed)
Misty StanleyLisa, PA-C gives verbal order for compazine now with PRN order, and Toradol 30mg  IV.

## 2013-12-06 NOTE — ED Notes (Signed)
Daughter, Samantha Crimesrquasia, is coming to pick up the patient and to give a ride home.

## 2013-12-06 NOTE — ED Notes (Signed)
Informed lightheadedness while ambulating to restroom and mild drift in limbs to Dr. Fonnie JarvisBednar while at the bedside.

## 2013-12-06 NOTE — ED Notes (Signed)
Attempted to start new IV access, unsuccessful.

## 2013-12-06 NOTE — ED Notes (Signed)
Reviewed plan of care with Misty StanleyLisa, PA-C.  No new orders at this time. Misty StanleyLisa is now at the bedside to update the patient.

## 2013-12-06 NOTE — ED Notes (Signed)
Dr. Bednar at the bedside.  

## 2013-12-06 NOTE — Assessment & Plan Note (Signed)
A: Con't to have migraine with no red flags. Pain medication prescribed by [facility]. Headache medication prescribed by neuro.  P: - Zofran, Benadryl and depomedrol given today in clinic - Sed rate normal - CBC pending - Encouraged to rest to break cycle. - F/u with PCP

## 2013-12-06 NOTE — Discharge Instructions (Signed)
Continue home migraine medications. Follow-up with your primary care physician. Return to the ED for new concerns.

## 2013-12-06 NOTE — ED Notes (Signed)
Dr. Fonnie JarvisBednar at the bedside to update the patient.

## 2013-12-06 NOTE — Progress Notes (Signed)
Patient ID: [name], female   DOB: 28-Sep-1960, 53 y.o.   MRN: [id number]    Subjective: HPI: Patient is a 53 y.o. female presenting to clinic today for ED follow up appointment. Concerns today include headaches  Headaches - She states she passed out in class last week, went to the ED at that time. CT was performed which was normal. Got HA cocktail which helped but did not take it away. Headache on the right side, currently behind right eye/teeth/ear. Describes it as feeling tight all the way down her arm. Associated with nausea. No fevers. No paraesthesias. Followed by [facility] for headaches. Patient reports that Dr. Tollie EthPlummer would like autoimmune labs, but unsure why.   History Reviewed: Never smoker.  ROS: Please see HPI above.  Objective: Office vital signs reviewed. BP 150/90  Pulse 100  Temp(Src) 98.5 F (36.9 C) (Oral)  Wt 222 lb (100.699 kg)  Physical Examination:  General: Awake, alert. Appears uncomfortable HEENT: Atraumatic, normocephalic Pulm: CTAB, no wheezes Cardio: RRR, no murmurs appreciated Extremities: No edema, moves all extremities Neuro: Grossly intact, grimaces with CN testing  Assessment: 53 y.o. female follow up  Plan: See Problem List and After Visit Summary

## 2013-12-06 NOTE — ED Notes (Signed)
Called pharmacy and spoke to WillowbrookGreg in regards to administering 2nd dose of compazine now.  Advised to wait an hour.  Reported to [place], [place], and she will switch compazine to reglan instead.

## 2013-12-06 NOTE — ED Notes (Signed)
CT had previously came to pick up the patient, but she was receiving new IV with use of ultrasound at the time.  Called CT to inform that patient is now ready for transport.

## 2013-12-07 NOTE — ED Provider Notes (Signed)
Medical screening examination/treatment/procedure(s) were performed by non-physician practitioner and as supervising physician I was immediately available for consultation/collaboration.   EKG Interpretation None       Juliet RudeNathan R. Rubin PayorPickering, MD 12/07/13 0030

## 2013-12-24 ENCOUNTER — Ambulatory Visit (INDEPENDENT_AMBULATORY_CARE_PROVIDER_SITE_OTHER): Payer: Medicaid Other | Admitting: Family Medicine

## 2013-12-24 ENCOUNTER — Encounter: Payer: Self-pay | Admitting: Family Medicine

## 2013-12-24 VITALS — BP 152/97 | HR 76 | Temp 98.7°F | Wt 220.0 lb

## 2013-12-24 DIAGNOSIS — I10 Essential (primary) hypertension: Secondary | ICD-10-CM

## 2013-12-24 DIAGNOSIS — M62838 Other muscle spasm: Secondary | ICD-10-CM

## 2013-12-24 DIAGNOSIS — G43711 Chronic migraine without aura, intractable, with status migrainosus: Secondary | ICD-10-CM

## 2013-12-24 DIAGNOSIS — G43019 Migraine without aura, intractable, without status migrainosus: Secondary | ICD-10-CM

## 2013-12-24 MED ORDER — PROPRANOLOL HCL 20 MG PO TABS: 40.0000 mg | ORAL_TABLET | Freq: Two times a day (BID) | ORAL | Status: DC

## 2013-12-24 MED ORDER — BACLOFEN 10 MG PO TABS: 10.0000 mg | ORAL_TABLET | Freq: Three times a day (TID) | ORAL | Status: DC | PRN

## 2013-12-24 NOTE — Patient Instructions (Signed)
Nice to see you. We will request records from your pain clinic doctor to see what additional work up they are requesting.  We are going to increase your propranolol dose to 40 mg twice a day as well.  You have some tightness in your right neck and we will try a muscle relaxer to help with this. This medication can cause some drowsiness, so please be careful when taking this.  If you develop weakness, numbness, worsening headache, passing out, change in vision please seek medical attention. It is important that you keep your appointments with the neurologist and the pain clinic.  Please find out what your cousin was diagnosed with and let us know.

## 2013-12-25 DIAGNOSIS — M62838 Other muscle spasm: Secondary | ICD-10-CM | POA: Insufficient documentation

## 2013-12-25 NOTE — Assessment & Plan Note (Signed)
Patient with noted pain and spasm in right trapezius muscle. This could be worsening her migraines. No changes in neurological status. Will give trial of baclofen to see if this is beneficial. Advised heat to the area. F/u in one month.

## 2013-12-25 NOTE — Progress Notes (Signed)
Patient ID: Linard MillersConstance M Grabski, female   DOB: [birth year], 53 y.o.   MRN: [id number]  Marikay AlarEric Melquisedec Journey, MD Phone: [phone]  Sydnee CabalConstance M Lanae BoastGarner is a 53 y.o. female who presents today for f/u.  Migraines: patient comes in for follow-up of migraine headaches. She reports that she has continued to have headaches without much relief. She has been to see the [facility] and they started her on percocet 5 mg BID in addition to her verapamil and propranolol and protriptyline. She notes this addition has not helped her much. She also states that [facility] was supposed to send her office notes to our clinic to let us know what labs they wanted ordered to work up her migraines. 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 She has previously had CTA head and MRI brain that have been unremarkable. She is also taking topamax for her headaches. She notes she is not taking the ansaid previously prescribed for her.   She reports constant headache on the right side. Often feels like she has a tooth ache and an ear ache. She notes photophobia with her headaches. She does note that she has continued to have right sided weakness compared to the left related to the pain in her right upper back.  She notes that her cousin has similar issues with headaches and they just recently diagnosed her with a specific cause which this patient can not remember.  HYPERTENSION Disease Monitoring Home BP Monitoring 150's-160's systolic Chest pain- no    Dyspnea- no Medications Compliance-  taking.  Edema- no  Right trapezius pain: notes this is present constantly. Feels like knots in the muscle. She had a massage and the masseuse noted that the patient could not relax during this. She notes she feels tight all the time. She notes there has been no change in her right sided weakness. She denies fever. The pain is worsened by movement and makes her headache worse.    Patient is a nonsmoker.   ROS: Per HPI   Physical Exam Filed Vitals:   12/24/13 0927  BP: 152/97  Pulse: 76  Temp: 98.7 F (37.1 C)    Gen: Well NAD HEENT: PERRL,  MMM Lungs: CTABL Nl WOB Heart: RRR no MRG Abd: NABS, NT, ND MSK: patient with no swelling in back, has tenderness to palpation and spasm of the right trapezius muscle, no midline tenderness to palpation Neuro: CN 2-12 intact, 5/5 strength in left deltoid, bicep, tricep, grip, hip flexors, quads, hamstring, plantar and dorsiflexion, 4+/5 strength in right deltoid, bicep, tricep, grip, hip flexors, quads, hamstring, plantar and dorsiflexion related to pain in upper back, sensation to light touch intact in bilateral upper and lower extremities, 2+ patellar and achilles reflexes bilaterally Exts: Non edematous BL  LE, warm and well perfused.   Assessment/Plan: Please see individual problem list.  # Healthcare maintenance: given information on mammogram and colonscopy

## 2013-12-25 NOTE — Assessment & Plan Note (Signed)
Not currently at goal. Will increase propranolol in attempt to help HA and HTN. F/u in one month. May additional BP agent at that time.

## 2013-12-25 NOTE — Assessment & Plan Note (Signed)
Patient continues to have issues with migraine headaches. Pain not improved with percocet. Will increase propranolol at this time as BP and pulse can withstand increase. Continue other current migraine medications. Will request records from [facility] to determine what labs they are requesting for the work up of her migraines. Patient is to ask her cousin what she was recently diagnosed with. She is to keep her upcoming appointments with Neurology and the pain clinic. Given return precautions. F/u in one month.

## 2013-12-25 NOTE — Assessment & Plan Note (Addendum)
Patient continues to have issues with migraine headaches. Pain not improved with percocet. Will increase propranolol at this time as BP and pulse can withstand increase. Continue other current migraine medications. Will request records from [facility] to determine what labs they are requesting for the work up of her migraines. Patient is to ask her cousin what she was recently diagnosed with. She is to keep her upcoming appointments with Neurology and the pain clinic. Given return precautions. F/u in one month. 

## 2014-01-07 ENCOUNTER — Ambulatory Visit: Payer: Medicaid Other | Admitting: Family Medicine

## 2014-01-11 ENCOUNTER — Emergency Department (HOSPITAL_COMMUNITY)
Admission: EM | Admit: 2014-01-11 | Discharge: 2014-01-11 | Disposition: A | Discharge status: Home / Self Care | Payer: Medicaid Other | Attending: Emergency Medicine | Admitting: Emergency Medicine

## 2014-01-11 ENCOUNTER — Emergency Department (HOSPITAL_COMMUNITY): Payer: Medicaid Other

## 2014-01-11 DIAGNOSIS — Z87442 Personal history of urinary calculi: Secondary | ICD-10-CM | POA: Insufficient documentation

## 2014-01-11 DIAGNOSIS — Z791 Long term (current) use of non-steroidal anti-inflammatories (NSAID): Secondary | ICD-10-CM | POA: Insufficient documentation

## 2014-01-11 DIAGNOSIS — G8929 Other chronic pain: Secondary | ICD-10-CM | POA: Diagnosis not present

## 2014-01-11 DIAGNOSIS — I10 Essential (primary) hypertension: Secondary | ICD-10-CM | POA: Diagnosis not present

## 2014-01-11 DIAGNOSIS — G43909 Migraine, unspecified, not intractable, without status migrainosus: Secondary | ICD-10-CM | POA: Diagnosis present

## 2014-01-11 DIAGNOSIS — Z8719 Personal history of other diseases of the digestive system: Secondary | ICD-10-CM | POA: Insufficient documentation

## 2014-01-11 DIAGNOSIS — R55 Syncope and collapse: Secondary | ICD-10-CM | POA: Diagnosis not present

## 2014-01-11 DIAGNOSIS — Z79899 Other long term (current) drug therapy: Secondary | ICD-10-CM | POA: Insufficient documentation

## 2014-01-11 MED ORDER — DIAZEPAM 5 MG/ML IJ SOLN
5.0000 mg | Freq: Once | INTRAMUSCULAR | Status: AC
  Administered 2014-01-11: 5 mg via INTRAVENOUS
  Filled 2014-01-11: qty 2

## 2014-01-11 MED ORDER — DIPHENHYDRAMINE HCL 50 MG/ML IJ SOLN
25.0000 mg | Freq: Once | INTRAMUSCULAR | Status: AC
  Administered 2014-01-11: 25 mg via INTRAVENOUS
  Filled 2014-01-11: qty 1

## 2014-01-11 MED ORDER — HYDROMORPHONE HCL PF 1 MG/ML IJ SOLN
1.0000 mg | Freq: Once | INTRAMUSCULAR | Status: AC
  Administered 2014-01-11: 1 mg via INTRAVENOUS
  Filled 2014-01-11: qty 1

## 2014-01-11 MED ORDER — DEXAMETHASONE SODIUM PHOSPHATE 10 MG/ML IJ SOLN
10.0000 mg | Freq: Once | INTRAMUSCULAR | Status: AC
  Administered 2014-01-11: 10 mg via INTRAVENOUS
  Filled 2014-01-11: qty 1

## 2014-01-11 MED ORDER — SODIUM CHLORIDE 0.9 % IV BOLUS (SEPSIS)
1000.0000 mL | Freq: Once | INTRAVENOUS | Status: AC
  Administered 2014-01-11: 1000 mL via INTRAVENOUS

## 2014-01-11 MED ORDER — MAGNESIUM SULFATE IN D5W 10-5 MG/ML-% IV SOLN
1.0000 g | Freq: Once | INTRAVENOUS | Status: AC
  Administered 2014-01-11: 1 g via INTRAVENOUS
  Filled 2014-01-11: qty 100

## 2014-01-11 MED ORDER — METOCLOPRAMIDE HCL 5 MG/ML IJ SOLN
10.0000 mg | Freq: Once | INTRAMUSCULAR | Status: AC
  Administered 2014-01-11: 10 mg via INTRAVENOUS
  Filled 2014-01-11: qty 2

## 2014-01-11 MED ORDER — ONDANSETRON 4 MG PO TBDP: ORAL_TABLET | ORAL | Status: DC

## 2014-01-11 MED ORDER — DIAZEPAM 5 MG PO TABS: 5.0000 mg | ORAL_TABLET | Freq: Four times a day (QID) | ORAL | Status: DC | PRN

## 2014-01-11 NOTE — ED Provider Notes (Addendum)
CSN: [id number]     Arrival date & time 01/11/14  1759 History   First MD Initiated Contact with Patient 01/11/14 1814     Chief Complaint  Patient presents with  . Migraine  . Near Syncope     (Consider location/radiation/quality/duration/timing/severity/associated sxs/prior Treatment) Patient is a 53 y.o. female presenting with migraines and near-syncope. The history is provided by the patient.  Migraine This is a chronic problem. The current episode started more than 1 week ago. The problem occurs constantly. The problem has been gradually worsening. Pertinent negatives include no chest pain, no abdominal pain and no shortness of breath. Nothing aggravates the symptoms. Nothing relieves the symptoms. She has tried nothing for the symptoms.  Near Syncope This is a recurrent (recureent with her migraines) problem. The problem has not changed since onset.Pertinent negatives include no chest pain, no abdominal pain and no shortness of breath.    Past Medical History  Diagnosis Date  . Hypertension   . Chest pain   . Gall stones   . Migraine     "qd" (02/14/2013)  . Chronic lower back pain     "right side to mid back" (02/14/2013)  . Kidney stone ~ 2006   Past Surgical History  Procedure Laterality Date  . Laparoscopic cholecystectomy  ~ 2004  . Cystoscopy w/ stone manipulation  ~ 2006  . Intrauterine device insertion      "initially put in in 04/2002; changed prn" (02/14/2013)   Family History  Problem Relation Age of Onset  . Cancer Mother   . Cirrhosis Father    History  Substance Use Topics  . Smoking status: Never Smoker   . Smokeless tobacco: Never Used  . Alcohol Use: Yes     Comment: 02/14/2013 "socially; couple times/month"   OB History   Grav Para Term Preterm Abortions TAB SAB Ect Mult Living                 Review of Systems  Constitutional: Negative for fever.  Respiratory: Negative for cough and shortness of breath.   Cardiovascular: Positive for  near-syncope. Negative for chest pain.  Gastrointestinal: Positive for nausea and vomiting. Negative for abdominal pain.  All other systems reviewed and are negative.     Allergies  Apple; Aspirin; and Carrot  Home Medications   Prior to Admission medications   Medication Sig Start Date End Date Taking? Authorizing Provider  baclofen (LIORESAL) 10 MG tablet Take 1 tablet (10 mg total) by mouth 3 (three) times daily as needed for muscle spasms. 12/24/13  Yes [name], MD  cloNIDine (CATAPRES) 0.1 MG tablet Take 1 tablet (0.1 mg total) by mouth 2 (two) times daily. 03/28/13  Yes [name], MD  flurbiprofen (ANSAID) 50 MG tablet Take twice a day as needed for headache. Do not take more than 3 times a week 09/14/13  Yes Van ClinesKaren M Aquino, MD  ondansetron (ZOFRAN ODT) 4 MG disintegrating tablet Take 1 tablet (4 mg total) by mouth every 6 (six) hours as needed for nausea. 07/22/13  Yes [name], MD  promethazine (PHENERGAN) 25 MG suppository Place 1 suppository (25 mg total) rectally every 6 (six) hours as needed for nausea. 07/22/13  Yes [name], MD  promethazine (PHENERGAN) 25 MG tablet Take 1 tablet (25 mg total) by mouth every 6 (six) hours as needed for nausea or vomiting. 06/28/13  Yes Ethelda ChickMartha K Linker, MD  propranolol (INDERAL) 20 MG tablet Take 2 tablets (40 mg  total) by mouth 2 (two) times daily. 12/24/13  Yes Glori Luis, MD  protriptyline (VIVACTIL) 5 MG tablet Take 5 mg by mouth 2 (two) times daily.   Yes Historical Provider, MD  sennosides-docusate sodium (SENOKOT-S) 8.6-50 MG tablet Take 1 tablet by mouth daily as needed for constipation.   Yes Historical Provider, MD  SUMAtriptan (IMITREX) 50 MG tablet Take 1 tablet (50 mg total) by mouth 2 (two) times a week. As needed for migraine (do not exceed 100 mg in one week). 03/22/13  Yes Glori Luis, MD  topiramate (TOPAMAX) 100 MG tablet Take 150 mg by mouth 2 (two) times daily.   Yes Historical Provider, MD   verapamil (CALAN) 80 MG tablet Take 1 tablet (80 mg total) by mouth 3 (three) times daily. 07/20/13  Yes Glori Luis, MD   BP 152/85  Pulse 61  Temp(Src) 98.8 F (37.1 C) (Oral)  Resp 16  SpO2 100% Physical Exam  Nursing note and vitals reviewed. Constitutional: She is oriented to person, place, and time. She appears well-developed and well-nourished. No distress.  HENT:  Head: Normocephalic and atraumatic.  Mouth/Throat: Oropharynx is clear and moist.  Eyes: EOM are normal. Pupils are equal, round, and reactive to light.  Neck: Normal range of motion. Neck supple.  Cardiovascular: Normal rate and regular rhythm.  Exam reveals no friction rub.   No murmur heard. Pulmonary/Chest: Effort normal and breath sounds normal. No respiratory distress. She has no wheezes. She has no rales.  Abdominal: Soft. She exhibits no distension. There is no tenderness. There is no rebound.  Musculoskeletal: Normal range of motion. She exhibits no edema.  Neurological: She is alert and oriented to person, place, and time. No cranial nerve deficit. She exhibits normal muscle tone. Coordination normal.  Photophobic  Skin: No rash noted. She is not diaphoretic.    ED Course  Procedures (including critical care time) Labs Review Labs Reviewed - No data to display  Imaging Review Ct Head Wo Contrast  01/11/2014   CLINICAL DATA:  Head CT.  Near syncopal event.  EXAM: CT HEAD WITHOUT CONTRAST  TECHNIQUE: Contiguous axial images were obtained from the base of the skull through the vertex without intravenous contrast.  COMPARISON:  Head CT 12/06/2013.  FINDINGS: No acute intracranial abnormalities. Specifically, no evidence of acute intracranial hemorrhage, no definite findings of acute/subacute cerebral ischemia, no mass, mass effect, hydrocephalus or abnormal intra or extra-axial fluid collections. Cavum septum pellucidum (normal anatomical variant) incidentally noted. Visualized paranasal sinuses and  mastoids are well pneumatized. No acute displaced skull fractures are identified.  IMPRESSION: *No acute intracranial abnormalities. *The appearance of the brain is normal.   Electronically Signed   By: Trudie Reed M.D.   On: 01/11/2014 20:37     EKG Interpretation None      MDM   Final diagnoses:  Migraine without status migrainosus, not intractable, unspecified migraine type    22F with history of chronic daily migraines. Today she passed out from her migraines. She allegedly hit head. She has some associated blurry vision, nausea vomiting. Fainting, nausea vomiting, blurry vision or all, with her migraines. Here she is stable vitals. She says to light. Neurologically she's intact with normal cranial nerves, normal strength and sensation. Will give migraine cocktail and CT her head. CT ok. Migraine cocktail with mild improvement, given dilaudid and magnesium with good relief. Feels like migraine is coming from tension in her R trapezius muscle and R neck. Given valium and Rx  for same.   Dagmar Hait, MD 01/11/14 2213  Dagmar Hait, MD 01/11/14 2225

## 2014-01-11 NOTE — Discharge Instructions (Signed)

## 2014-01-11 NOTE — ED Notes (Signed)
Patient reports possible syncopal episode. Patient reports migraine, with Hx of migraines. Patient reports sensitivity to light, dizziness, seeing "silver spots" and nausea. Patient rates pain 10/10.

## 2014-01-11 NOTE — ED Notes (Signed)
Bed: WA01 Expected date:  Expected time:  Means of arrival:  Comments: EMS-migraine/fall/hit head

## 2014-03-04 ENCOUNTER — Emergency Department (HOSPITAL_BASED_OUTPATIENT_CLINIC_OR_DEPARTMENT_OTHER)
Admission: EM | Admit: 2014-03-04 | Discharge: 2014-03-05 | Disposition: A | Discharge status: Home / Self Care | Payer: Medicaid Other | Attending: Emergency Medicine | Admitting: Emergency Medicine

## 2014-03-04 ENCOUNTER — Encounter (HOSPITAL_BASED_OUTPATIENT_CLINIC_OR_DEPARTMENT_OTHER): Payer: Self-pay | Admitting: Emergency Medicine

## 2014-03-04 DIAGNOSIS — Z87442 Personal history of urinary calculi: Secondary | ICD-10-CM | POA: Insufficient documentation

## 2014-03-04 DIAGNOSIS — G43909 Migraine, unspecified, not intractable, without status migrainosus: Secondary | ICD-10-CM | POA: Insufficient documentation

## 2014-03-04 DIAGNOSIS — I10 Essential (primary) hypertension: Secondary | ICD-10-CM | POA: Insufficient documentation

## 2014-03-04 DIAGNOSIS — G8929 Other chronic pain: Secondary | ICD-10-CM | POA: Insufficient documentation

## 2014-03-04 DIAGNOSIS — G43009 Migraine without aura, not intractable, without status migrainosus: Secondary | ICD-10-CM | POA: Diagnosis not present

## 2014-03-04 DIAGNOSIS — Z79899 Other long term (current) drug therapy: Secondary | ICD-10-CM | POA: Diagnosis not present

## 2014-03-04 DIAGNOSIS — Z8719 Personal history of other diseases of the digestive system: Secondary | ICD-10-CM | POA: Diagnosis not present

## 2014-03-04 DIAGNOSIS — R112 Nausea with vomiting, unspecified: Secondary | ICD-10-CM | POA: Insufficient documentation

## 2014-03-04 MED ORDER — PROMETHAZINE HCL 25 MG/ML IJ SOLN
25.0000 mg | Freq: Once | INTRAMUSCULAR | Status: AC
  Administered 2014-03-04: 25 mg via INTRAVENOUS
  Filled 2014-03-04: qty 1

## 2014-03-04 MED ORDER — KETOROLAC TROMETHAMINE 30 MG/ML IJ SOLN: 30.0000 mg | Freq: Once | INTRAMUSCULAR | Status: DC

## 2014-03-04 MED ORDER — DIPHENHYDRAMINE HCL 50 MG/ML IJ SOLN
25.0000 mg | Freq: Once | INTRAMUSCULAR | Status: AC
  Administered 2014-03-04: 25 mg via INTRAVENOUS
  Filled 2014-03-04: qty 1

## 2014-03-04 MED ORDER — HYDROMORPHONE HCL PF 1 MG/ML IJ SOLN
1.0000 mg | Freq: Once | INTRAMUSCULAR | Status: AC
Start: 2014-03-04 — End: 2014-03-04
  Administered 2014-03-04: 1 mg via INTRAVENOUS
  Filled 2014-03-04: qty 1

## 2014-03-04 MED ORDER — HYDROMORPHONE HCL PF 1 MG/ML IJ SOLN
1.0000 mg | Freq: Once | INTRAMUSCULAR | Status: AC
  Administered 2014-03-04: 1 mg via INTRAVENOUS
  Filled 2014-03-04: qty 1

## 2014-03-04 MED ORDER — SODIUM CHLORIDE 0.9 % IV BOLUS (SEPSIS)
1000.0000 mL | Freq: Once | INTRAVENOUS | Status: AC
  Administered 2014-03-04: 1000 mL via INTRAVENOUS

## 2014-03-04 MED ORDER — METOCLOPRAMIDE HCL 5 MG/ML IJ SOLN
10.0000 mg | Freq: Once | INTRAMUSCULAR | Status: AC
  Administered 2014-03-04: 10 mg via INTRAVENOUS
  Filled 2014-03-04: qty 2

## 2014-03-04 NOTE — ED Notes (Signed)
Migraine headache. Hx of same. To ED via EMS. Nausea.

## 2014-03-04 NOTE — ED Provider Notes (Signed)
CSN: [id number]     Arrival date & time 03/04/14  1941 History  This chart was scribed for Ethelda Chick, MD, by Yevette Edwards, ED Scribe. This patient was seen in room MH10/MH10 and the patient's care was started at 9:44 PM.   First MD Initiated Contact with Patient 03/04/14 2115     Chief Complaint  Patient presents with  . Migraine    Patient is a 53 y.o. female presenting with migraines. The history is provided by the patient. No language interpreter was used.  Migraine This is a chronic problem. The current episode started 6 to 12 hours ago. The problem occurs daily. The problem has not changed since onset.Associated symptoms include headaches. The symptoms are relieved by medications. The treatment provided mild relief.   HPI Comments: Kirsten Walsh is a 53 y.o. female, with a h/o migraines, who was brought to the ED via EMS complaining of a right-sided migraine which worsened today. Kirsten Walsh endorses nausea and emesis associated with the migraine. She states she experiences migraines daily, and these symptoms are similar to previous migraines. Kirsten Walsh reports the diazepam she was prescribed for the back pain at her last ED visit improved the back pain.   Past Medical History  Diagnosis Date  . Hypertension   . Chest pain   . Gall stones   . Migraine     "qd" (02/14/2013)  . Chronic lower back pain     "right side to mid back" (02/14/2013)  . Kidney stone ~ 2006   Past Surgical History  Procedure Laterality Date  . Laparoscopic cholecystectomy  ~ 2004  . Cystoscopy w/ stone manipulation  ~ 2006  . Intrauterine device insertion      "initially put in in 04/2002; changed prn" (02/14/2013)   Family History  Problem Relation Age of Onset  . Cancer Mother   . Cirrhosis Father    History  Substance Use Topics  . Smoking status: Never Smoker   . Smokeless tobacco: Never Used  . Alcohol Use: Yes     Comment: 02/14/2013 "socially; couple times/month"   No OB  history provided.  Review of Systems  Gastrointestinal: Positive for nausea and vomiting.  Neurological: Positive for headaches.  All other systems reviewed and are negative.   Allergies  Apple; Aspirin; and Carrot  Home Medications   Prior to Admission medications   Medication Sig Start Date End Date Taking? Authorizing Provider  Oxycodone-Acetaminophen (PERCOCET PO) Take by mouth.   Yes Historical Provider, MD  baclofen (LIORESAL) 10 MG tablet Take 1 tablet (10 mg total) by mouth 3 (three) times daily as needed for muscle spasms. 12/24/13   Glori Luis, MD  cloNIDine (CATAPRES) 0.1 MG tablet Take 1 tablet (0.1 mg total) by mouth 2 (two) times daily. 03/28/13   Glori Luis, MD  diazepam (VALIUM) 5 MG tablet Take 1 tablet (5 mg total) by mouth every 6 (six) hours as needed for anxiety (spasms). 01/11/14   Elwin Mocha, MD  diazepam (VALIUM) 5 MG tablet Take 1 tablet (5 mg total) by mouth 2 (two) times daily. 03/05/14   Ethelda Chick, MD  flurbiprofen (ANSAID) 50 MG tablet Take twice a day as needed for headache. Do not take more than 3 times a week 09/14/13   Van Clines, MD  ondansetron (ZOFRAN ODT) 4 MG disintegrating tablet Take 1 tablet (4 mg total) by mouth every 6 (six) hours as needed for nausea. 07/22/13   Yehuda Mao  Birdie Sons, MD  ondansetron (ZOFRAN ODT) 4 MG disintegrating tablet  ODT q4 hours prn nausea/vomit 01/11/14   Elwin Mocha, MD  promethazine (PHENERGAN) 25 MG suppository Place 1 suppository (25 mg total) rectally every 6 (six) hours as needed for nausea. 07/22/13   Glori Luis, MD  promethazine (PHENERGAN) 25 MG tablet Take 1 tablet (25 mg total) by mouth every 6 (six) hours as needed for nausea or vomiting. 06/28/13   Ethelda Chick, MD  propranolol (INDERAL) 20 MG tablet Take 2 tablets (40 mg total) by mouth 2 (two) times daily. 12/24/13   Glori Luis, MD  protriptyline (VIVACTIL) 5 MG tablet Take 5 mg by mouth 2 (two) times daily.    Historical  Provider, MD  sennosides-docusate sodium (SENOKOT-S) 8.6-50 MG tablet Take 1 tablet by mouth daily as needed for constipation.    Historical Provider, MD  SUMAtriptan (IMITREX) 50 MG tablet Take 1 tablet (50 mg total) by mouth 2 (two) times a week. As needed for migraine (do not exceed 100 mg in one week). 03/22/13   Glori Luis, MD  topiramate (TOPAMAX) 100 MG tablet Take 150 mg by mouth 2 (two) times daily.    Historical Provider, MD  verapamil (CALAN) 80 MG tablet Take 1 tablet (80 mg total) by mouth 3 (three) times daily. 07/20/13   Glori Luis, MD   Triage Vitals: BP 130/86  Pulse 78  Temp(Src) 98 F (36.7 C) (Oral)  Resp 20  Wt 192 lb (87.091 kg)  SpO2 100% Vitals reviewed Physical Exam Physical Examination: General appearance - alert, well appearing, and in no distress Mental status - alert, oriented to person, place, and time Eyes - pupils equal and reactive, extraocular eye movements intact Mouth - mucous membranes moist, pharynx normal without lesions Chest - clear to auscultation, no wheezes, rales or rhonchi, symmetric air entry Heart - normal rate, regular rhythm, normal S1, S2, no murmurs, rubs, clicks or gallops Abdomen - soft, nontender, nondistended, no masses or organomegaly Neurological - alert, oriented x 3, cranial nerves 2-12 tested and intact, strength 5/5 in extremities x 4, sensation intact Extremities - peripheral pulses normal, no pedal edema, no clubbing or cyanosis Skin - normal coloration and turgor, no rashes  ED Course  Procedures (including critical care time)  DIAGNOSTIC STUDIES: Oxygen Saturation is 100% on room air, normal by my interpretation.    COORDINATION OF CARE:  9:48 PM- Discussed treatment plan with patient, and the patient agreed to the plan.   12:01 AM pt feeling much improved after meds.  Will proceed with discharge, she is requesting more valium which was given at her last visit and she states this helped with the muscle  spasm in her right trapezius.    Labs Review Labs Reviewed - No data to display  Imaging Review No results found.   EKG Interpretation None      MDM   Final diagnoses:  Migraine without aura and without status migrainosus, not intractable    Pt presenting with an increase in her usual migraine headaache symptoms, she has chronic daily migraine at baseline.  Pt feels some improvement after migraine cocktail- also requesting valium as these helped with tight trapezius muscles after last visit.  rx given.  Doubt SAH, meningitis, CVA or other acute cause of her symptoms today.  Discharged with strict return precautions.  Pt agreeable with plan.  I personally performed the services described in this documentation, which was scribed in my presence. The recorded  information has been reviewed and is accurate.  Nursing notes including past medical history and social history reviewed and considered in documentation Prior records reviewed and considered during this visit    Ethelda Chick, MD 03/05/14 1714

## 2014-03-05 MED ORDER — DIAZEPAM 5 MG PO TABS: 5.0000 mg | ORAL_TABLET | Freq: Two times a day (BID) | ORAL | Status: DC

## 2014-03-05 NOTE — ED Notes (Signed)
PT discharged to home with family. NAD. 

## 2014-03-05 NOTE — Discharge Instructions (Signed)
Return to the ED with any concerns including changes in vision or speech, weakness in arms or legs, fever/chills, fainting, vomiting and not able to keep down liquids, decreased level of alertness/lethargy, or any other alarming symptoms

## 2014-03-19 ENCOUNTER — Encounter (HOSPITAL_BASED_OUTPATIENT_CLINIC_OR_DEPARTMENT_OTHER): Payer: Self-pay | Admitting: Emergency Medicine

## 2014-03-19 ENCOUNTER — Emergency Department (HOSPITAL_BASED_OUTPATIENT_CLINIC_OR_DEPARTMENT_OTHER)
Admission: EM | Admit: 2014-03-19 | Discharge: 2014-03-19 | Disposition: A | Discharge status: Home / Self Care | Payer: Medicaid Other | Attending: Emergency Medicine | Admitting: Emergency Medicine

## 2014-03-19 DIAGNOSIS — R55 Syncope and collapse: Secondary | ICD-10-CM | POA: Insufficient documentation

## 2014-03-19 DIAGNOSIS — Z79899 Other long term (current) drug therapy: Secondary | ICD-10-CM | POA: Diagnosis not present

## 2014-03-19 DIAGNOSIS — I10 Essential (primary) hypertension: Secondary | ICD-10-CM | POA: Diagnosis not present

## 2014-03-19 DIAGNOSIS — G43401 Hemiplegic migraine, not intractable, with status migrainosus: Secondary | ICD-10-CM | POA: Diagnosis not present

## 2014-03-19 DIAGNOSIS — R110 Nausea: Secondary | ICD-10-CM | POA: Insufficient documentation

## 2014-03-19 DIAGNOSIS — G8929 Other chronic pain: Secondary | ICD-10-CM | POA: Diagnosis not present

## 2014-03-19 DIAGNOSIS — R404 Transient alteration of awareness: Secondary | ICD-10-CM | POA: Insufficient documentation

## 2014-03-19 DIAGNOSIS — Z87442 Personal history of urinary calculi: Secondary | ICD-10-CM | POA: Insufficient documentation

## 2014-03-19 DIAGNOSIS — H53149 Visual discomfort, unspecified: Secondary | ICD-10-CM | POA: Insufficient documentation

## 2014-03-19 MED ORDER — DEXAMETHASONE SODIUM PHOSPHATE 4 MG/ML IJ SOLN
INTRAMUSCULAR | Status: AC
  Administered 2014-03-19: 10 mg
  Filled 2014-03-19: qty 3

## 2014-03-19 MED ORDER — METOCLOPRAMIDE HCL 5 MG/ML IJ SOLN
10.0000 mg | Freq: Once | INTRAMUSCULAR | Status: AC
  Administered 2014-03-19: 10 mg via INTRAVENOUS
  Filled 2014-03-19: qty 2

## 2014-03-19 MED ORDER — SODIUM CHLORIDE 0.9 % IV SOLN
INTRAVENOUS | Status: DC
  Administered 2014-03-19: 13:00:00 via INTRAVENOUS

## 2014-03-19 MED ORDER — DEXAMETHASONE SODIUM PHOSPHATE 10 MG/ML IJ SOLN: 10.0000 mg | Freq: Once | INTRAMUSCULAR | Status: DC

## 2014-03-19 MED ORDER — DIPHENHYDRAMINE HCL 50 MG/ML IJ SOLN
25.0000 mg | Freq: Once | INTRAMUSCULAR | Status: AC
  Administered 2014-03-19: 25 mg via INTRAVENOUS
  Filled 2014-03-19: qty 1

## 2014-03-19 MED ORDER — PROMETHAZINE HCL 25 MG/ML IJ SOLN
12.5000 mg | Freq: Once | INTRAMUSCULAR | Status: AC
  Administered 2014-03-19: 12.5 mg via INTRAVENOUS
  Filled 2014-03-19: qty 1

## 2014-03-19 NOTE — ED Notes (Signed)
Unable to draw labs ordered after multiple sticks. Hope NP aware.

## 2014-03-19 NOTE — ED Provider Notes (Signed)
CSN: [id number]     Arrival date & time 03/19/14  1138 History   First MD Initiated Contact with Patient 03/19/14 1150     Chief Complaint  Patient presents with  . Loss of Consciousness     (Consider location/radiation/quality/duration/timing/severity/associated sxs/prior Treatment) Patient is a 53 y.o. female presenting with syncope and migraines. The history is provided by the patient.  Loss of Consciousness Episode history:  Single Most recent episode:  Today Progression:  Resolved Context comment:  Going from bending foreward to standing Witnessed: yes   Worsened by:  Nothing tried Associated symptoms: dizziness, headaches and nausea   Associated symptoms: no chest pain, no confusion, no diaphoresis, no fever, no palpitations and no vomiting   Headaches:    Severity:  Severe   Onset quality:  Gradual   Timing:  Constant   Progression:  Worsening Migraine The current episode started today. The problem occurs constantly. The problem has been gradually worsening. Associated symptoms include chills, headaches and nausea. Pertinent negatives include no abdominal pain, chest pain, coughing, diaphoresis, fever, myalgias, neck pain, rash, swollen glands, urinary symptoms or vomiting. Nothing aggravates the symptoms. Treatments tried: Topamax and Percocet. The treatment provided no relief.   Kirsten Walsh is a 53 y.o. female who presents to the ED after having a syncopal episode just prior to arrival. She states that she developed a headache worse than usual this morning. She took her medication for migraines and then went on to school and finished one class and then was waiting for the second class when she had the syncopal episode. Bystanders called EMS and they brought the patient here. She states that she has had similar episodes in the past with syncopal episodes. She denies chest pain, shortness of breath or diaphoresis. She does have nausea.    Past Medical History  Diagnosis  Date  . Hypertension   . Chest pain   . Gall stones   . Migraine     "qd" (02/14/2013)  . Chronic lower back pain     "right side to mid back" (02/14/2013)  . Kidney stone ~ 2006   Past Surgical History  Procedure Laterality Date  . Laparoscopic cholecystectomy  ~ 2004  . Cystoscopy w/ stone manipulation  ~ 2006  . Intrauterine device insertion      "initially put in in 04/2002; changed prn" (02/14/2013)   Family History  Problem Relation Age of Onset  . Cancer Mother   . Cirrhosis Father    History  Substance Use Topics  . Smoking status: Never Smoker   . Smokeless tobacco: Never Used  . Alcohol Use: Yes     Comment: 02/14/2013 "socially; couple times/month"   OB History   Grav Para Term Preterm Abortions TAB SAB Ect Mult Living                 Review of Systems  Constitutional: Positive for chills. Negative for fever and diaphoresis.  Eyes: Positive for photophobia and visual disturbance (blurry).  Respiratory: Negative for cough.   Cardiovascular: Positive for syncope. Negative for chest pain, palpitations and leg swelling.  Gastrointestinal: Positive for nausea. Negative for vomiting and abdominal pain.  Genitourinary: Negative for dysuria and frequency.  Musculoskeletal: Negative for myalgias and neck pain. Back pain: chronic.  Skin: Negative for rash.  Neurological: Positive for dizziness, syncope and headaches. Negative for speech difficulty.  Psychiatric/Behavioral: Negative for confusion. The patient is not nervous/anxious.       Allergies  Apple; Aspirin; and  Carrot  Home Medications   Prior to Admission medications   Medication Sig Start Date End Date Taking? Authorizing Provider  baclofen (LIORESAL) 10 MG tablet Take 1 tablet (10 mg total) by mouth 3 (three) times daily as needed for muscle spasms. 12/24/13   Glori Luis, MD  cloNIDine (CATAPRES) 0.1 MG tablet Take 1 tablet (0.1 mg total) by mouth 2 (two) times daily. 03/28/13   Glori Luis,  MD  diazepam (VALIUM) 5 MG tablet Take 1 tablet (5 mg total) by mouth every 6 (six) hours as needed for anxiety (spasms). 01/11/14   Elwin Mocha, MD  diazepam (VALIUM) 5 MG tablet Take 1 tablet (5 mg total) by mouth 2 (two) times daily. 03/05/14   Ethelda Chick, MD  flurbiprofen (ANSAID) 50 MG tablet Take twice a day as needed for headache. Do not take more than 3 times a week 09/14/13   Van Clines, MD  ondansetron (ZOFRAN ODT) 4 MG disintegrating tablet Take 1 tablet (4 mg total) by mouth every 6 (six) hours as needed for nausea. 07/22/13   Glori Luis, MD  ondansetron (ZOFRAN ODT) 4 MG disintegrating tablet 4mg  ODT q4 hours prn nausea/vomit 01/11/14   Elwin Mocha, MD  Oxycodone-Acetaminophen (PERCOCET PO) Take by mouth.    Historical Provider, MD  promethazine (PHENERGAN) 25 MG suppository Place 1 suppository (25 mg total) rectally every 6 (six) hours as needed for nausea. 07/22/13   Glori Luis, MD  promethazine (PHENERGAN) 25 MG tablet Take 1 tablet (25 mg total) by mouth every 6 (six) hours as needed for nausea or vomiting. 06/28/13   Ethelda Chick, MD  propranolol (INDERAL) 20 MG tablet Take 2 tablets (40 mg total) by mouth 2 (two) times daily. 12/24/13   Glori Luis, MD  protriptyline (VIVACTIL) 5 MG tablet Take 5 mg by mouth 2 (two) times daily.    Historical Provider, MD  sennosides-docusate sodium (SENOKOT-S) 8.6-50 MG tablet Take 1 tablet by mouth daily as needed for constipation.    Historical Provider, MD  SUMAtriptan (IMITREX) 50 MG tablet Take 1 tablet (50 mg total) by mouth 2 (two) times a week. As needed for migraine (do not exceed 100 mg in one week). 03/22/13   Glori Luis, MD  topiramate (TOPAMAX) 100 MG tablet Take 150 mg by mouth 2 (two) times daily.    Historical Provider, MD  verapamil (CALAN) 80 MG tablet Take 1 tablet (80 mg total) by mouth 3 (three) times daily. 07/20/13   Glori Luis, MD   BP 150/95  Pulse 74  Temp(Src) 99.2 F (37.3 C) (Oral)   Resp 16  Ht 5' (1.524 m)  Wt 190 lb (86.183 kg)  BMI 37.11 kg/m2  SpO2 100% Physical Exam  Constitutional: She is oriented to person, place, and time. She appears well-developed and well-nourished. No distress.  HENT:  Head: Normocephalic and atraumatic.  Right Ear: Tympanic membrane normal.  Left Ear: Tympanic membrane normal.  Nose: Nose normal.  Mouth/Throat: Uvula is midline, oropharynx is clear and moist and mucous membranes are normal.  Eyes: Conjunctivae and EOM are normal.  Neck: Normal range of motion. Neck supple.  Cardiovascular: Normal rate and regular rhythm.   Pulmonary/Chest: Effort normal. She has no wheezes. She has no rales.  Abdominal: Soft. Bowel sounds are normal. She exhibits no mass. There is no tenderness.  Musculoskeletal: She exhibits no edema.  Radial and pedal pulses strong, adequate circulation, good touch sensation.  Neurological: She  is alert and oriented to person, place, and time. No cranial nerve deficit or sensory deficit. She displays a negative Romberg sign. Gait normal.  Reflex Scores:      Bicep reflexes are 2+ on the right side and 2+ on the left side.      Brachioradialis reflexes are 2+ on the right side and 2+ on the left side.      Patellar reflexes are 2+ on the right side and 2+ on the left side.      Achilles reflexes are 2+ on the right side and 2+ on the left side. Rapid alternating movement without difficulty. Stands on one foot without difficulty. Slightly decreased grip with right but patient states is her norm when she has a headache.   Skin: Skin is warm and dry.  Psychiatric: She has a normal mood and affect. Her behavior is normal.    ED Course  Procedures  IV hydration, Decadron 10 mg. Benadryl 25 mg, Reglan 10 mg IV  MDM  After medications and IV fluids patient feeling better, ambulated to the bathroom and felt a little dizzy and had nausea. Phenergan 12.5 mg IV given. She has not eaten so will give PO fluids and  crackers. After phenergan, PO fluids and crackers the patient is feeling better. Stable for discharge without neuro deficits. Discussed with the patient plan of care and all questioned fully answered. She will return if any problems arise.     [place], [place] 03/19/14 2303

## 2014-03-19 NOTE — Discharge Instructions (Signed)
Call your doctor for a follow up visit and let them know about your trip to the ED. Return as needed for problems.   Migraine Headache A migraine headache is very bad, throbbing pain on one or both sides of your head. Talk to your doctor about what things may bring on (trigger) your migraine headaches. HOME CARE  Only take medicines as told by your doctor.  Lie down in a dark, quiet room when you have a migraine.  Keep a journal to find out if certain things bring on migraine headaches. For example, write down:  What you eat and drink.  How much sleep you get.  Any change to your diet or medicines.  Lessen how much alcohol you drink.  Quit smoking if you smoke.  Get enough sleep.  Lessen any stress in your life.  Keep lights dim if bright lights bother you or make your migraines worse. GET HELP RIGHT AWAY IF:   Your migraine becomes really bad.  You have a fever.  You have a stiff neck.  You have trouble seeing.  Your muscles are weak, or you lose muscle control.  You lose your balance or have trouble walking.  You feel like you will pass out (faint), or you pass out.  You have really bad symptoms that are different than your first symptoms. MAKE SURE YOU:   Understand these instructions.  Will watch your condition.  Will get help right away if you are not doing well or get worse. Document Released: 03/16/2008 Document Revised: 08/30/2011 Document Reviewed: 02/12/2013 [facility] Patient Information 2015 [place], [place]. This information is not intended to replace advice given to you by your health care provider. Make sure you discuss any questions you have with your health care provider.  Recurrent Migraine Headache A migraine headache is very bad, throbbing pain on one or both sides of your head. Recurrent migraines keep coming back. Talk to your doctor about what things may bring on (trigger) your migraine headaches. HOME CARE  Only take medicines as told by  your doctor.  Lie down in a dark, quiet room when you have a migraine.  Keep a journal to find out if certain things bring on migraine headaches. For example, write down:  What you eat and drink.  How much sleep you get.  Any change to your diet or medicines.  Lessen how much alcohol you drink.  Quit smoking if you smoke.  Get enough sleep.  Lessen any stress in your life.  Keep lights dim if bright lights bother you or make your migraines worse. GET HELP IF:  Medicine does not help your migraines.  Your pain keeps coming back.  You have a fever. GET HELP RIGHT AWAY IF:   Your migraine becomes really bad.  You have a stiff neck.  You have trouble seeing.  Your muscles are weak, or you lose muscle control.  You lose your balance or have trouble walking.  You feel like you will pass out (faint), or you pass out.  You have really bad symptoms that are different than your first symptoms. MAKE SURE YOU:   Understand these instructions.  Will watch your condition.  Will get help right away if you are not doing well or get worse. Document Released: 03/16/2008 Document Revised: 06/12/2013 Document Reviewed: 02/12/2013 [employer] Patient Information 2015 [place], [place]. This information is not intended to replace advice given to you by your health care provider. Make sure you discuss any questions you have with your  health care provider. ° °

## 2014-03-19 NOTE — ED Notes (Signed)
[facility]  Has daily migraines and states this is typical when migraines are severe.

## 2014-03-19 NOTE — ED Notes (Signed)
Pt c/o dizziness while ambulating to the restroom

## 2014-03-19 NOTE — ED Notes (Signed)
Graham crackers and peanut butter given to pt when she is able to eat. Family at bedside.

## 2014-03-20 ENCOUNTER — Ambulatory Visit: Payer: Medicaid Other | Admitting: Neurology

## 2014-03-20 NOTE — ED Provider Notes (Signed)
Medical screening examination/treatment/procedure(s) were conducted as a shared visit with non-physician practitioner(s) and myself.  I personally evaluated the patient during the encounter.   EKG Interpretation   Date/Time:  Tuesday March 19 2014 12:21:38 EDT Ventricular Rate:  55 PR Interval:  172 QRS Duration: 86 QT Interval:  448 QTC Calculation: 428 R Axis:   32 Text Interpretation:  Sinus bradycardia Cannot rule out Anterior infarct ,  age undetermined Abnormal ECG since last tracing no significant change  Confirmed by Kare Dado  MD, Earle Troiano ([id number]) on 03/19/2014 1:06:49 PM      PT presents with migraine.  Has constant daily migraine pain.  Presents today with same type of pain.  No unusual symptoms.  Has mild weakness to right arm which is chronic and unchanged from baseline.  Rolan BuccoMelanie Shyleigh Daughtry, MD 03/20/14 [phone]

## 2014-04-01 ENCOUNTER — Ambulatory Visit (INDEPENDENT_AMBULATORY_CARE_PROVIDER_SITE_OTHER): Payer: Medicaid Other | Admitting: Neurology

## 2014-04-01 ENCOUNTER — Telehealth: Payer: Self-pay | Admitting: Family Medicine

## 2014-04-01 ENCOUNTER — Encounter: Payer: Self-pay | Admitting: Neurology

## 2014-04-01 VITALS — BP 118/84 | HR 77 | Resp 16 | Ht 65.0 in | Wt 212.0 lb

## 2014-04-01 DIAGNOSIS — R519 Headache, unspecified: Secondary | ICD-10-CM

## 2014-04-01 DIAGNOSIS — R51 Headache: Secondary | ICD-10-CM

## 2014-04-01 MED ORDER — SERTRALINE HCL 25 MG PO TABS: ORAL_TABLET | ORAL | Status: DC

## 2014-04-01 NOTE — Telephone Encounter (Signed)
Called patient. Left msg on voicemail that her ins doesn't cover Botox injections. Asked her to call me back if any further questions.
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
## 2014-04-01 NOTE — Patient Instructions (Signed)
1. Start reducing Protriptyline: Take 1 tablet at bedtime for 1 week, then stop 2. Check on Verapamil if you are still taking it 3. Please call us on how you are taking the Propranolol 4. After 2 weeks, start Zoloft 25mg : Take 1 tablet daily for 1 week, then increase to 2 tablets daily 5. We will check on Botox 6. Follow-up in 2 months

## 2014-04-03 ENCOUNTER — Encounter: Payer: Self-pay | Admitting: Neurology

## 2014-04-03 DIAGNOSIS — R51 Headache: Principal | ICD-10-CM

## 2014-04-03 DIAGNOSIS — R519 Headache, unspecified: Secondary | ICD-10-CM | POA: Insufficient documentation

## 2014-04-19 ENCOUNTER — Encounter (HOSPITAL_BASED_OUTPATIENT_CLINIC_OR_DEPARTMENT_OTHER): Payer: Self-pay | Admitting: Emergency Medicine

## 2014-04-19 ENCOUNTER — Emergency Department (HOSPITAL_BASED_OUTPATIENT_CLINIC_OR_DEPARTMENT_OTHER)
Admission: EM | Admit: 2014-04-19 | Discharge: 2014-04-20 | Disposition: A | Discharge status: Home / Self Care | Payer: Medicaid Other | Attending: Emergency Medicine | Admitting: Emergency Medicine

## 2014-04-19 DIAGNOSIS — Z8719 Personal history of other diseases of the digestive system: Secondary | ICD-10-CM | POA: Insufficient documentation

## 2014-04-19 DIAGNOSIS — Z79899 Other long term (current) drug therapy: Secondary | ICD-10-CM | POA: Diagnosis not present

## 2014-04-19 DIAGNOSIS — R200 Anesthesia of skin: Secondary | ICD-10-CM | POA: Diagnosis not present

## 2014-04-19 DIAGNOSIS — I10 Essential (primary) hypertension: Secondary | ICD-10-CM | POA: Diagnosis not present

## 2014-04-19 DIAGNOSIS — R531 Weakness: Secondary | ICD-10-CM | POA: Diagnosis not present

## 2014-04-19 DIAGNOSIS — G43111 Migraine with aura, intractable, with status migrainosus: Secondary | ICD-10-CM | POA: Diagnosis not present

## 2014-04-19 DIAGNOSIS — Z87442 Personal history of urinary calculi: Secondary | ICD-10-CM | POA: Diagnosis not present

## 2014-04-19 DIAGNOSIS — G8929 Other chronic pain: Secondary | ICD-10-CM | POA: Insufficient documentation

## 2014-04-19 DIAGNOSIS — H9201 Otalgia, right ear: Secondary | ICD-10-CM | POA: Diagnosis not present

## 2014-04-19 DIAGNOSIS — G43909 Migraine, unspecified, not intractable, without status migrainosus: Secondary | ICD-10-CM | POA: Diagnosis present

## 2014-04-19 MED ORDER — ONDANSETRON HCL 4 MG/2ML IJ SOLN
INTRAMUSCULAR | Status: AC
  Filled 2014-04-19: qty 2

## 2014-04-19 MED ORDER — ONDANSETRON HCL 4 MG/2ML IJ SOLN
4.0000 mg | Freq: Once | INTRAMUSCULAR | Status: AC
  Administered 2014-04-19: 4 mg via INTRAVENOUS
  Filled 2014-04-19: qty 2

## 2014-04-19 MED ORDER — VALPROATE SODIUM 500 MG/5ML IV SOLN: 500.0000 mg | Freq: Once | INTRAVENOUS | Status: AC

## 2014-04-19 MED ORDER — HYDROMORPHONE HCL 1 MG/ML IJ SOLN
1.0000 mg | INTRAMUSCULAR | Status: AC | PRN
  Administered 2014-04-19 (×2): 1 mg via INTRAVENOUS
  Filled 2014-04-19 (×2): qty 1

## 2014-04-19 MED ORDER — ERGOTAMINE-CAFFEINE 1-100 MG PO TABS: ORAL_TABLET | ORAL | Status: DC

## 2014-04-19 MED ORDER — SODIUM CHLORIDE 0.9 % IV BOLUS (SEPSIS)
1000.0000 mL | Freq: Once | INTRAVENOUS | Status: AC
  Administered 2014-04-19: 1000 mL via INTRAVENOUS

## 2014-04-19 MED ORDER — VALPROATE SODIUM 500 MG/5ML IV SOLN
INTRAVENOUS | Status: AC
  Administered 2014-04-19: 500 mg
  Filled 2014-04-19: qty 5

## 2014-04-19 MED ORDER — DEXAMETHASONE SODIUM PHOSPHATE 10 MG/ML IJ SOLN: 10.0000 mg | Freq: Once | INTRAMUSCULAR | Status: AC

## 2014-04-19 MED ORDER — ONDANSETRON HCL 4 MG/2ML IJ SOLN
4.0000 mg | Freq: Once | INTRAMUSCULAR | Status: AC
  Administered 2014-04-19: 4 mg via INTRAVENOUS

## 2014-04-19 MED ORDER — DIPHENHYDRAMINE HCL 50 MG/ML IJ SOLN
25.0000 mg | Freq: Once | INTRAMUSCULAR | Status: AC
  Administered 2014-04-19: 25 mg via INTRAVENOUS
  Filled 2014-04-19: qty 1

## 2014-04-19 MED ORDER — DEXAMETHASONE SODIUM PHOSPHATE 4 MG/ML IJ SOLN
INTRAMUSCULAR | Status: AC
  Administered 2014-04-19: 10 mg
  Filled 2014-04-19: qty 3

## 2014-04-19 NOTE — ED Notes (Signed)
Pt reports she is here because "I have migraines everyday, and it is bad today."

## 2014-04-19 NOTE — ED Provider Notes (Signed)
CSN: [id number]     Arrival date & time 04/19/14  1927 History  This chart was scribe for Kirsten PorterMark Jameeka Marcy, MD by Kirsten Walsh, ED Scribe. The patient was seen in room MH06/MH06 and the patient's care was started at 8:05 PM.    Chief Complaint  Patient presents with  . Migraine   The history is provided by the patient. No language interpreter was used.   HPI Comments: Kirsten Walsh is a 53 y.o. female with a history of migraines who presents to the Emergency Dertment complaining of a constant HA onset 3 days. She reports associated pain in the right side of back of her head and right neck and inner pain of the right ear. She also reports seeing the "silver effect" in the right eye. She states that she has experienced N/V since onset, tingling and numbness in the right foot and weakness in her right side of the body. She adds that the weakness is constant but the tingling and numbness is intermittent. She denies fever. She reports allergies to aspirin, apples, and carrots. She is currently taking Topamax for her migraines and Clonidine for HTN. She has seen a neurologists and has had a normal MRI in the past.   Past Medical History  Diagnosis Date  . Hypertension   . Chest pain   . Gall stones   . Migraine     "qd" (02/14/2013)  . Chronic lower back pain     "right side to mid back" (02/14/2013)  . Kidney stone ~ 2006   Past Surgical History  Procedure Laterality Date  . Laparoscopic cholecystectomy  ~ 2004  . Cystoscopy w/ stone manipulation  ~ 2006  . Intrauterine device insertion      "initially put in in 04/2002; changed prn" (02/14/2013)   Family History  Problem Relation Age of Onset  . Cancer Mother   . Cirrhosis Father    History  Substance Use Topics  . Smoking status: Never Smoker   . Smokeless tobacco: Never Used  . Alcohol Use: Yes     Comment: 02/14/2013 "socially; couple times/month"   OB History   Grav Para Term Preterm Abortions TAB SAB Ect Mult Living                  Review of Systems  Constitutional: Negative for appetite change and fatigue.  HENT: Negative for congestion, ear discharge and sinus pressure.   Eyes: Negative for discharge.  Respiratory: Negative for cough.   Gastrointestinal: Negative for diarrhea.  Genitourinary: Negative for frequency and hematuria.  Musculoskeletal: Negative for back pain.  Skin: Negative for rash.  Neurological: Negative for seizures.  Psychiatric/Behavioral: Negative for hallucinations.      Allergies  Apple; Aspirin; and Carrot  Home Medications   Prior to Admission medications   Medication Sig Start Date End Date Taking? Authorizing Provider  baclofen (LIORESAL) 10 MG tablet Take 1 tablet (10 mg total) by mouth 3 (three) times daily as needed for muscle spasms. 12/24/13   [name], MD  cloNIDine (CATAPRES) 0.1 MG tablet Take 1 tablet (0.1 mg total) by mouth 2 (two) times daily. 03/28/13   [name], MD  ergotamine-caffeine (CAFERGOT) 1-100 MG per tablet Two tablets at onset of attack; then 1 tablet every 30 minutes as needed; maximum: 6 tablets per attack; do not exceed 10 tablets/week 04/19/14   Kirsten PorterMark Ruairi Stutsman, MD  flurbiprofen (ANSAID) 50 MG tablet Take twice a day as needed for headache. Do not take  more than 3 times a week 09/14/13   Van ClinesKaren M Aquino, MD  ondansetron (ZOFRAN ODT) 4 MG disintegrating tablet Take 1 tablet (4 mg total) by mouth every 6 (six) hours as needed for nausea. 07/22/13   [name], MD  oxyCODONE (OXY IR/ROXICODONE) 5 MG immediate release tablet Take 5 mg by mouth. Every 12 hrs prn    Historical Provider, MD  promethazine (PHENERGAN) 25 MG suppository Place 1 suppository (25 mg total) rectally every 6 (six) hours as needed for nausea. 07/22/13   [name], MD  propranolol (INDERAL) 20 MG tablet Take 2 tablets (40 mg total) by mouth 2 (two) times daily. 12/24/13   [name], MD  protriptyline (VIVACTIL) 5 MG tablet Take 5 mg by mouth 2 (two)  times daily.    Historical Provider, MD  sennosides-docusate sodium (SENOKOT-S) 8.6-50 MG tablet Take 1 tablet by mouth daily as needed for constipation.    Historical Provider, MD  sertraline (ZOLOFT) 25 MG tablet Take 1 tablet daily for 1 week, then increase to 2 tablets daily 04/01/14   Van ClinesKaren M Aquino, MD  topiramate (TOPAMAX) 100 MG tablet Take 150 mg by mouth 2 (two) times daily.    Historical Provider, MD  verapamil (CALAN) 80 MG tablet Take 1 tablet (80 mg total) by mouth 3 (three) times daily. 07/20/13   [name], MD   BP 136/87  Pulse 59  Temp(Src) 98.5 F (36.9 C) (Oral)  Resp 18  SpO2 100% Physical Exam  Nursing note and vitals reviewed. Constitutional: She is oriented to person, place, and time. She appears well-developed and well-nourished.  HENT:  Head: Normocephalic and atraumatic.  Eyes: Conjunctivae and EOM are normal. Pupils are equal, round, and reactive to light.  Neck: Normal range of motion. Neck supple.  Cardiovascular: Normal rate, regular rhythm and normal heart sounds.   Pulmonary/Chest: Effort normal and breath sounds normal.  Abdominal: Soft. Bowel sounds are normal.  Musculoskeletal: Normal range of motion.  Neurological: She is alert and oriented to person, place, and time.  Skin: Skin is warm and dry.  Psychiatric: She has a normal mood and affect. Her behavior is normal.    ED Course  Procedures (including critical care time) DIAGNOSTIC STUDIES: Oxygen Saturation is 100% on RA, normal by my interpretation.    COORDINATION OF CARE: 8:10 PM- Pt advised of plan for treatment and pt agrees.  Labs Review Labs Reviewed - No data to display  Imaging Review No results found.   EKG Interpretation None      MDM   Final diagnoses:  Intractable migraine with aura with status migrainosus    History of getting complete relief with meds. She'll be discharged home and continue her chronic pain meds at home. She has taken ergotamine many  years ago with good relief and without side effects. Given a prescription for Cafergot.  I personally performed the services described in this documentation, which was scribed in my presence. The recorded information has been reviewed and is accurate.    Kirsten PorterMark Gevorg Brum, MD 04/19/14 [phone]

## 2014-04-19 NOTE — Discharge Instructions (Signed)

## 2014-04-26 ENCOUNTER — Other Ambulatory Visit: Payer: Self-pay | Admitting: Family Medicine

## 2014-04-26 DIAGNOSIS — I10 Essential (primary) hypertension: Secondary | ICD-10-CM

## 2014-04-26 NOTE — Telephone Encounter (Signed)
Refill given. Patient needs a follow-up for her blood pressure in the next 2 months. Please advise her of this. Thanks.

## 2014-04-29 NOTE — Telephone Encounter (Signed)
LM for patient "rx was sent to pharmacy and PCP would like for you to follow up in 2 months".  Emmabelle Fear,CMA

## 2014-05-01 ENCOUNTER — Encounter (HOSPITAL_COMMUNITY): Payer: Self-pay | Admitting: Emergency Medicine

## 2014-05-01 ENCOUNTER — Emergency Department (INDEPENDENT_AMBULATORY_CARE_PROVIDER_SITE_OTHER)
Admission: EM | Admit: 2014-05-01 | Discharge: 2014-05-01 | Disposition: A | Discharge status: Nursing Home (Includes ICF) | Payer: Medicaid Other | Source: Home / Self Care | Attending: Emergency Medicine | Admitting: Emergency Medicine

## 2014-05-01 ENCOUNTER — Emergency Department (HOSPITAL_COMMUNITY)
Admission: EM | Admit: 2014-05-01 | Discharge: 2014-05-02 | Disposition: A | Discharge status: Home / Self Care | Payer: Medicaid Other | Attending: Emergency Medicine | Admitting: Emergency Medicine

## 2014-05-01 ENCOUNTER — Encounter (HOSPITAL_COMMUNITY): Payer: Self-pay | Admitting: *Deleted

## 2014-05-01 DIAGNOSIS — Z87442 Personal history of urinary calculi: Secondary | ICD-10-CM | POA: Insufficient documentation

## 2014-05-01 DIAGNOSIS — I10 Essential (primary) hypertension: Secondary | ICD-10-CM | POA: Insufficient documentation

## 2014-05-01 DIAGNOSIS — Z8679 Personal history of other diseases of the circulatory system: Secondary | ICD-10-CM | POA: Diagnosis not present

## 2014-05-01 DIAGNOSIS — G43011 Migraine without aura, intractable, with status migrainosus: Secondary | ICD-10-CM

## 2014-05-01 DIAGNOSIS — G8929 Other chronic pain: Secondary | ICD-10-CM | POA: Diagnosis not present

## 2014-05-01 DIAGNOSIS — Z79899 Other long term (current) drug therapy: Secondary | ICD-10-CM | POA: Insufficient documentation

## 2014-05-01 DIAGNOSIS — G43909 Migraine, unspecified, not intractable, without status migrainosus: Secondary | ICD-10-CM | POA: Diagnosis not present

## 2014-05-01 LAB — CBC WITH DIFFERENTIAL/PLATELET
Basophils Absolute: 0 10*3/uL (ref 0.0–0.1)
Basophils Relative: 0 % (ref 0–1)
EOS ABS: 0 10*3/uL (ref 0.0–0.7)
EOS PCT: 0 % (ref 0–5)
HEMATOCRIT: 45.3 % (ref 36.0–46.0)
Hemoglobin: 15.7 g/dL — ABNORMAL HIGH (ref 12.0–15.0)
Lymphocytes Relative: 8 % — ABNORMAL LOW (ref 12–46)
Lymphs Abs: 0.6 10*3/uL — ABNORMAL LOW (ref 0.7–4.0)
MCH: 31.3 pg (ref 26.0–34.0)
MCHC: 34.7 g/dL (ref 30.0–36.0)
MCV: 90.2 fL (ref 78.0–100.0)
MONO ABS: 0 10*3/uL — AB (ref 0.1–1.0)
Monocytes Relative: 1 % — ABNORMAL LOW (ref 3–12)
Neutro Abs: 6.2 10*3/uL (ref 1.7–7.7)
Neutrophils Relative %: 91 % — ABNORMAL HIGH (ref 43–77)
Platelets: 194 10*3/uL (ref 150–400)
RBC: 5.02 MIL/uL (ref 3.87–5.11)
RDW: 13.7 % (ref 11.5–15.5)
WBC: 6.8 10*3/uL (ref 4.0–10.5)

## 2014-05-01 MED ORDER — SODIUM CHLORIDE 0.9 % IV SOLN
1000.0000 mg | Freq: Once | INTRAVENOUS | Status: AC
  Administered 2014-05-02: 1000 mg via INTRAVENOUS
  Filled 2014-05-01: qty 8

## 2014-05-01 MED ORDER — METOCLOPRAMIDE HCL 5 MG/ML IJ SOLN
INTRAMUSCULAR | Status: AC
  Filled 2014-05-01: qty 2

## 2014-05-01 MED ORDER — DEXAMETHASONE SODIUM PHOSPHATE 10 MG/ML IJ SOLN
INTRAMUSCULAR | Status: AC
  Filled 2014-05-01: qty 1

## 2014-05-01 MED ORDER — SODIUM CHLORIDE 0.9 % IV BOLUS (SEPSIS)
1000.0000 mL | Freq: Once | INTRAVENOUS | Status: AC
  Administered 2014-05-01: 1000 mL via INTRAVENOUS

## 2014-05-01 MED ORDER — DIHYDROERGOTAMINE MESYLATE 1 MG/ML IJ SOLN
1.0000 mg | Freq: Once | INTRAMUSCULAR | Status: AC
  Administered 2014-05-01: 1 mg via INTRAVENOUS
  Filled 2014-05-01: qty 1

## 2014-05-01 MED ORDER — DIPHENHYDRAMINE HCL 50 MG/ML IJ SOLN
25.0000 mg | Freq: Once | INTRAMUSCULAR | Status: AC
  Administered 2014-05-01: 25 mg via INTRAMUSCULAR

## 2014-05-01 MED ORDER — VALPROATE SODIUM 500 MG/5ML IV SOLN
1.0000 g | Freq: Once | INTRAVENOUS | Status: AC
  Administered 2014-05-02: 1000 mg via INTRAVENOUS
  Filled 2014-05-01: qty 10

## 2014-05-01 MED ORDER — DIPHENHYDRAMINE HCL 50 MG/ML IJ SOLN
INTRAMUSCULAR | Status: AC
  Filled 2014-05-01: qty 1

## 2014-05-01 MED ORDER — DEXAMETHASONE SODIUM PHOSPHATE 10 MG/ML IJ SOLN
10.0000 mg | Freq: Once | INTRAMUSCULAR | Status: AC
  Administered 2014-05-01: 10 mg via INTRAMUSCULAR

## 2014-05-01 MED ORDER — METOCLOPRAMIDE HCL 5 MG/ML IJ SOLN
20.0000 mg | Freq: Once | INTRAVENOUS | Status: AC
  Administered 2014-05-01: 20 mg via INTRAVENOUS
  Filled 2014-05-01: qty 4

## 2014-05-01 MED ORDER — DIPHENHYDRAMINE HCL 50 MG/ML IJ SOLN
25.0000 mg | Freq: Once | INTRAMUSCULAR | Status: AC
  Administered 2014-05-01: 25 mg via INTRAVENOUS
  Filled 2014-05-01: qty 1

## 2014-05-01 MED ORDER — MAGNESIUM SULFATE 2 GM/50ML IV SOLN
2.0000 g | INTRAVENOUS | Status: AC
  Administered 2014-05-01: 2 g via INTRAVENOUS
  Filled 2014-05-01: qty 50

## 2014-05-01 MED ORDER — METOCLOPRAMIDE HCL 5 MG/ML IJ SOLN
10.0000 mg | Freq: Once | INTRAMUSCULAR | Status: AC
  Administered 2014-05-01: 10 mg via INTRAMUSCULAR

## 2014-05-01 MED ORDER — HALOPERIDOL LACTATE 5 MG/ML IJ SOLN
2.0000 mg | Freq: Once | INTRAMUSCULAR | Status: AC
  Administered 2014-05-01: 2 mg via INTRAVENOUS
  Filled 2014-05-01: qty 1

## 2014-05-01 NOTE — ED Notes (Signed)
Attempted IV x1. Unable to access. Karolee StampsJanelle, RN sent in to attempt.

## 2014-05-01 NOTE — ED Notes (Signed)
Pt sent here from [facility]. Pt has history of migraines, having severe headache x 2 days. Reports n/v and sensitivity to light. No relief with IM injects at [facility] of reglan, benadryl and decadron.

## 2014-05-01 NOTE — ED Notes (Signed)
C/o constant migraine HA onset 2 days [facility]; given iv meds Reports syncope episode today around 1230; has had this before in the past Alert, no signs of acute distress.

## 2014-05-01 NOTE — ED Notes (Signed)
PA at the bedside.

## 2014-05-01 NOTE — ED Provider Notes (Signed)
CSN: [id number]     Arrival date & time 05/01/14  1521 History   First MD Initiated Contact with Patient 05/01/14 1548     Chief Complaint  Patient presents with  . Headache   (Consider location/radiation/quality/duration/timing/severity/associated sxs/prior Treatment) HPI         53 year old female with history of complex migraines associated with syncopal episodes presents complaining of a severe migraine for 2 days, with a syncopal episode today around 12:30, approximately 3 hours prior to arrival. She reports that this migraine is in no way different from any migraine she has ever had. She has pain in the right side of her head, nausea, vomiting, photophobia, dizziness. She has tried taking her medications for migraines but they have not helped because she threw them up. She says that in the past when she has this sort of problem, sometimes she is able to be treated in the emergency department that time she has required admission into the hospital for IV DHE. She denies any trauma. She has chronic right-sided numbness, no change in that.  Past Medical History  Diagnosis Date  . Hypertension   . Chest pain   . Gall stones   . Migraine     "qd" (02/14/2013)  . Chronic lower back pain     "right side to mid back" (02/14/2013)  . Kidney stone ~ 2006   Past Surgical History  Procedure Laterality Date  . Laparoscopic cholecystectomy  ~ 2004  . Cystoscopy w/ stone manipulation  ~ 2006  . Intrauterine device insertion      "initially put in in 04/2002; changed prn" (02/14/2013)   Family History  Problem Relation Age of Onset  . Cancer Mother   . Cirrhosis Father    History  Substance Use Topics  . Smoking status: Never Smoker   . Smokeless tobacco: Never Used  . Alcohol Use: Yes     Comment: 02/14/2013 "socially; couple times/month"   OB History    No data available     Review of Systems  Respiratory: Negative for shortness of breath.   Cardiovascular: Negative for chest pain.   Gastrointestinal: Positive for nausea and vomiting. Negative for abdominal pain and diarrhea.  Musculoskeletal: Positive for gait problem.  Neurological: Positive for dizziness, syncope, weakness, light-headedness and headaches.  All other systems reviewed and are negative.   Allergies  Apple; Aspirin; and Carrot  Home Medications   Prior to Admission medications   Medication Sig Start Date End Date Taking? Authorizing Provider  cloNIDine (CATAPRES) 0.1 MG tablet TAKE 1 TABLET BY MOUTH TWICE DAILY 04/26/14  Yes [name], MD  oxyCODONE (OXY IR/ROXICODONE) 5 MG immediate release tablet Take 5 mg by mouth. Every 12 hrs prn   Yes Historical Provider, MD  topiramate (TOPAMAX) 100 MG tablet Take 150 mg by mouth 2 (two) times daily.   Yes Historical Provider, MD  baclofen (LIORESAL) 10 MG tablet Take 1 tablet (10 mg total) by mouth 3 (three) times daily as needed for muscle spasms. 12/24/13   [name], MD  ergotamine-caffeine (CAFERGOT) 1-100 MG per tablet Two tablets at onset of attack; then 1 tablet every 30 minutes as needed; maximum: 6 tablets per attack; do not exceed 10 tablets/week 04/19/14   Rolland PorterMark James, MD  flurbiprofen (ANSAID) 50 MG tablet Take twice a day as needed for headache. Do not take more than 3 times a week 09/14/13   Van ClinesKaren M Aquino, MD  ondansetron (ZOFRAN ODT) 4 MG disintegrating tablet Take  1 tablet (4 mg total) by mouth every 6 (six) hours as needed for nausea. 07/22/13   [name], MD  promethazine (PHENERGAN) 25 MG suppository Place 1 suppository (25 mg total) rectally every 6 (six) hours as needed for nausea. 07/22/13   [name], MD  propranolol (INDERAL) 20 MG tablet Take 2 tablets (40 mg total) by mouth 2 (two) times daily. 12/24/13   [name], MD  protriptyline (VIVACTIL) 5 MG tablet Take 5 mg by mouth 2 (two) times daily.    Historical Provider, MD  sennosides-docusate sodium (SENOKOT-S) 8.6-50 MG tablet Take 1 tablet by mouth daily as  needed for constipation.    Historical Provider, MD  sertraline (ZOLOFT) 25 MG tablet Take 1 tablet daily for 1 week, then increase to 2 tablets daily 04/01/14   Van ClinesKaren M Aquino, MD  verapamil (CALAN) 80 MG tablet Take 1 tablet (80 mg total) by mouth 3 (three) times daily. 07/20/13   [name], MD   BP 130/85 mmHg  Pulse 66  Temp(Src) 98.7 F (37.1 C) (Oral)  SpO2 100% Physical Exam  Constitutional: She is oriented to person, place, and time. Vital signs are normal. She appears well-developed and well-nourished. No distress.  HENT:  Head: Normocephalic and atraumatic.  Eyes:  Eyes constantly fluttering  Neck: Normal range of motion. Neck supple.  Cardiovascular: Normal rate, regular rhythm, normal heart sounds and intact distal pulses.  Exam reveals no gallop and no friction rub.   No murmur heard. Pulmonary/Chest: Effort normal and breath sounds normal. No respiratory distress. She has no wheezes. She has no rales. She exhibits no tenderness.  Neurological: She is alert and oriented to person, place, and time. She has normal strength and normal reflexes. She displays normal reflexes. No cranial nerve deficit or sensory deficit. She exhibits abnormal muscle tone. She displays a negative Romberg sign. Coordination normal. GCS eye subscore is 4. GCS verbal subscore is 5. GCS motor subscore is 6.  Chronic right upper and lower extremity weakness  Skin: Skin is warm and dry. No rash noted. She is not diaphoretic.  Psychiatric: She has a normal mood and affect. Judgment normal.  Nursing note and vitals reviewed.   ED Course  Procedures (including critical care time) Labs Review Labs Reviewed - No data to display  Imaging Review No results found.   MDM   1. Intractable migraine without aura and with status migrainosus    One hour after administration of 10 mg of Decadron, 10 mg of Reglan, and 25 mg of diphenhydramine, headache is down to 8 out of 10 from 9 out of 10. Patient is  being transferred to the emergency department for status migrainosus with intractable migraine     Graylon GoodZachary H Marzella Miracle, PA-C 05/01/14 1731

## 2014-05-01 NOTE — ED Notes (Signed)
Pt ambulated to restroom. 

## 2014-05-01 NOTE — ED Provider Notes (Signed)
CSN: [id number]     Arrival date & time 05/01/14  1809 History   First MD Initiated Contact with Patient 05/01/14 1825     Chief Complaint  Patient presents with  . Migraine     (Consider location/radiation/quality/duration/timing/severity/associated sxs/prior Treatment) HPI Comments: Patient is a 53 year old female with history of hypertension, migraines who presents the emergency department today for evaluation of migraine. She reports that this has been ongoing for the past 2 days. Her symptoms are gradually worsening. Her headache today is the same as her normal migraines. She does have a sharp pain in the front of her face which is new. She has associated photophobia, phonophobia, lightheadedness. Today she has an episode of syncope. She reports that around 12:30 she was at work and told her coworker that her head was hurting and she felt she was going to pass out. He was able to catch her before she hit the ground. The patient reports that she was unconscious for a very short amount of time. She has right arm weakness which is chronic. This weakness waxes and wanes with her migraines. She has tried taking her home medications, but has thrown these medications up. She initially went to urgent care. She received 10 mg of Decadron, 10 mg of Reglan, and 25 mg of Benadryl IM. She did not receive any relief with these medications.  The history is provided by the patient. No language interpreter was used.    Past Medical History  Diagnosis Date  . Hypertension   . Chest pain   . Gall stones   . Migraine     "qd" (02/14/2013)  . Chronic lower back pain     "right side to mid back" (02/14/2013)  . Kidney stone ~ 2006   Past Surgical History  Procedure Laterality Date  . Laparoscopic cholecystectomy  ~ 2004  . Cystoscopy w/ stone manipulation  ~ 2006  . Intrauterine device insertion      "initially put in in 04/2002; changed prn" (02/14/2013)   Family History  Problem Relation Age of  Onset  . Cancer Mother   . Cirrhosis Father    History  Substance Use Topics  . Smoking status: Never Smoker   . Smokeless tobacco: Never Used  . Alcohol Use: Yes     Comment: 02/14/2013 "socially; couple times/month"   OB History    No data available     Review of Systems  Constitutional: Negative for fever and chills.  Respiratory: Negative for shortness of breath.   Cardiovascular: Negative for chest pain.  Gastrointestinal: Positive for nausea and vomiting.  Neurological: Positive for syncope, weakness (chronic), light-headedness and headaches.  All other systems reviewed and are negative.     Allergies  Apple; Aspirin; and Carrot  Home Medications   Prior to Admission medications   Medication Sig Start Date End Date Taking? Authorizing Provider  baclofen (LIORESAL) 10 MG tablet Take 1 tablet (10 mg total) by mouth 3 (three) times daily as needed for muscle spasms. 12/24/13   [name], MD  cloNIDine (CATAPRES) 0.1 MG tablet TAKE 1 TABLET BY MOUTH TWICE DAILY 04/26/14   [name], MD  ergotamine-caffeine (CAFERGOT) 1-100 MG per tablet Two tablets at onset of attack; then 1 tablet every 30 minutes as needed; maximum: 6 tablets per attack; do not exceed 10 tablets/week 04/19/14   Rolland PorterMark James, MD  flurbiprofen (ANSAID) 50 MG tablet Take twice a day as needed for headache. Do not take more than 3 times  a week 09/14/13   Van Clines, MD  ondansetron (ZOFRAN ODT) 4 MG disintegrating tablet Take 1 tablet (4 mg total) by mouth every 6 (six) hours as needed for nausea. 07/22/13   Glori Luis, MD  oxyCODONE (OXY IR/ROXICODONE) 5 MG immediate release tablet Take 5 mg by mouth. Every 12 hrs prn    Historical Provider, MD  promethazine (PHENERGAN) 25 MG suppository Place 1 suppository (25 mg total) rectally every 6 (six) hours as needed for nausea. 07/22/13   Glori Luis, MD  propranolol (INDERAL) 20 MG tablet Take 2 tablets (40 mg total) by mouth 2 (two) times  daily. 12/24/13   Glori Luis, MD  protriptyline (VIVACTIL) 5 MG tablet Take 5 mg by mouth 2 (two) times daily.    Historical Provider, MD  sennosides-docusate sodium (SENOKOT-S) 8.6-50 MG tablet Take 1 tablet by mouth daily as needed for constipation.    Historical Provider, MD  sertraline (ZOLOFT) 25 MG tablet Take 1 tablet daily for 1 week, then increase to 2 tablets daily 04/01/14   Van Clines, MD  topiramate (TOPAMAX) 100 MG tablet Take 150 mg by mouth 2 (two) times daily.    Historical Provider, MD  verapamil (CALAN) 80 MG tablet Take 1 tablet (80 mg total) by mouth 3 (three) times daily. 07/20/13   Glori Luis, MD   BP 150/78 mmHg  Pulse 68  Temp(Src) 98.6 F (37 C) (Oral)  Resp 16  SpO2 99% Physical Exam  Constitutional: She is oriented to person, place, and time. She appears well-developed and well-nourished. No distress.  HENT:  Head: Normocephalic and atraumatic.  Right Ear: External ear normal.  Left Ear: External ear normal.  Nose: Nose normal.  Mouth/Throat: Oropharynx is clear and moist.  No temporal artery tenderness  Eyes: Conjunctivae and EOM are normal. Pupils are equal, round, and reactive to light.  Color contacts in  Neck: Normal range of motion. No spinous process tenderness and no muscular tenderness present.  No nuchal rigidity or meningeal signs  Cardiovascular: Normal rate, regular rhythm, normal heart sounds, intact distal pulses and normal pulses.   Pulses:      Radial pulses are 2+ on the right side, and 2+ on the left side.       Posterior tibial pulses are 2+ on the right side, and 2+ on the left side.  Pulmonary/Chest: Effort normal and breath sounds normal. No stridor. No respiratory distress. She has no wheezes. She has no rales.  Abdominal: Soft. She exhibits no distension. There is no tenderness.  Musculoskeletal: Normal range of motion.  Moves all extremities without guarding or ataxia  Neurological: She is alert and oriented to  person, place, and time. GCS eye subscore is 4. GCS verbal subscore is 5. GCS motor subscore is 6.  Decreased strength to right arm. Patient reports this is chronic Finger-nose-finger normal bilaterally  Skin: Skin is warm and dry. She is not diaphoretic. No erythema.  Psychiatric: She has a normal mood and affect. Her behavior is normal.  Nursing note and vitals reviewed.   ED Course  Procedures (including critical care time) Labs Review Labs Reviewed  CBC WITH DIFFERENTIAL - Abnormal; Notable for the following:    Hemoglobin 15.7 (*)    Neutrophils Relative % 91 (*)    Lymphocytes Relative 8 (*)    Lymphs Abs 0.6 (*)    Monocytes Relative 1 (*)    Monocytes Absolute 0.0 (*)    All other components  within normal limits  BASIC METABOLIC PANEL - Abnormal; Notable for the following:    CO2 15 (*)    Glucose, Bld 136 (*)    GFR calc non Af Amer 80 (*)    Anion gap 16 (*)    All other components within normal limits    Imaging Review No results found.   EKG Interpretation None      MDM   Final diagnoses:  None    Patient presents emergency department for evaluation of migraine. Migraine is the same as her normal headaches. Patient with little improvement after Haldol, Benadryl, DHE, Reglan, magnesium. Discussed case with Dr. Cyril Mourningamillo who recommends 1g of solumedrol and depacon. This took approximately 2 hours to come from pharmacy and patient is just receiving dose of medication. Patient reports she is beginning to feel some relief. Patient signed out to Dr. Blinda LeatherwoodPollina at change of shift. If patient feels better after infusion of medication she can go home. If patient does not get any improvement, will need to admit to medicine with neuro consult. Vital signs stable. Patient aware of plan. Dr. Littie DeedsGentry evaluated patient and agrees with plan. Patient / Family / Caregiver informed of clinical course, understand medical decision-making process, and agree with plan.   Medications   methylPREDNISolone sodium succinate (SOLU-MEDROL) 1,000 mg in sodium chloride 0.9 % 50 mL IVPB (1,000 mg Intravenous Given 05/02/14 0136)  sodium chloride 0.9 % bolus 1,000 mL (0 mLs Intravenous Stopped 05/01/14 2159)  magnesium sulfate IVPB 2 g 50 mL (0 g Intravenous Stopped 05/01/14 2208)  metoCLOPramide (REGLAN) 20 mg in dextrose 5 % 50 mL IVPB (20 mg Intravenous Given 05/01/14 2026)  dihydroergotamine (DHE) injection 1 mg (1 mg Intravenous Given 05/01/14 2101)  diphenhydrAMINE (BENADRYL) injection 25 mg (25 mg Intravenous Given 05/01/14 2022)  haloperidol lactate (HALDOL) injection 2 mg (2 mg Intravenous Given 05/01/14 2334)  valproate (DEPACON) 1,000 mg in dextrose 5 % 50 mL IVPB (0 g Intravenous Stopped 05/02/14 0136)      Mora BellmanHannah S Nora Rooke, PA-C 05/02/14 0215  Mirian MoMatthew Gentry, MD 05/02/14 2205

## 2014-05-02 LAB — BASIC METABOLIC PANEL
Anion gap: 16 — ABNORMAL HIGH (ref 5–15)
BUN: 6 mg/dL (ref 6–23)
CALCIUM: 8.7 mg/dL (ref 8.4–10.5)
CO2: 15 mEq/L — ABNORMAL LOW (ref 19–32)
CREATININE: 0.83 mg/dL (ref 0.50–1.10)
Chloride: 106 mEq/L (ref 96–112)
GFR calc Af Amer: >90 mL/min (ref >90)
GFR, EST NON AFRICAN AMERICAN: 80 mL/min — AB (ref >90)
GLUCOSE: 136 mg/dL — AB (ref 70–99)
Potassium: 4.2 mEq/L (ref 3.7–5.3)
Sodium: 137 mEq/L (ref 137–147)

## 2014-05-02 MED ORDER — PROMETHAZINE HCL 25 MG PO TABS: 25.0000 mg | ORAL_TABLET | Freq: Four times a day (QID) | ORAL | Status: DC | PRN

## 2014-05-02 NOTE — ED Notes (Signed)
Called Pharmacy about Soulmedrol. Stated they would tube medication up shortly.

## 2014-05-02 NOTE — Discharge Instructions (Signed)

## 2014-05-02 NOTE — ED Provider Notes (Signed)
Patient signed out to me to follow-up on her improvement after medication for migraine headache. Patient was seen for typical migraine headache. She was given multiple medications without improvement. Neurology consult recommended IV Depakote and IV Solu-Medrol. She was administered his medications and upon recheck was sleeping comfortably. After I woke her up she reports that her headache was much improved. She said it was not completely gone but was much better than it was at presentation. I offered her admission versus discharge with follow-up with her neurologist. She tells me that she feels well enough to go home at this point.  Gilda Creasehristopher J. Pollina, MD 05/02/14 [id number]

## 2014-05-21 ENCOUNTER — Ambulatory Visit: Payer: Medicaid Other | Admitting: Neurology

## 2014-06-09 ENCOUNTER — Encounter (HOSPITAL_COMMUNITY): Payer: Self-pay | Admitting: *Deleted

## 2014-06-09 ENCOUNTER — Inpatient Hospital Stay (HOSPITAL_COMMUNITY)
Admission: EM | Admit: 2014-06-09 | Discharge: 2014-06-13 | DRG: 103 | Disposition: A | Discharge status: Home / Self Care | Payer: Medicaid Other | Attending: Family Medicine | Admitting: Family Medicine

## 2014-06-09 DIAGNOSIS — IMO0002 Reserved for concepts with insufficient information to code with codable children: Secondary | ICD-10-CM | POA: Diagnosis present

## 2014-06-09 DIAGNOSIS — R51 Headache: Secondary | ICD-10-CM

## 2014-06-09 DIAGNOSIS — G43901 Migraine, unspecified, not intractable, with status migrainosus: Secondary | ICD-10-CM | POA: Diagnosis present

## 2014-06-09 DIAGNOSIS — I10 Essential (primary) hypertension: Secondary | ICD-10-CM | POA: Diagnosis present

## 2014-06-09 DIAGNOSIS — G43911 Migraine, unspecified, intractable, with status migrainosus: Secondary | ICD-10-CM | POA: Diagnosis present

## 2014-06-09 DIAGNOSIS — G43709 Chronic migraine without aura, not intractable, without status migrainosus: Secondary | ICD-10-CM | POA: Diagnosis present

## 2014-06-09 DIAGNOSIS — R519 Headache, unspecified: Secondary | ICD-10-CM | POA: Diagnosis present

## 2014-06-09 DIAGNOSIS — M545 Low back pain: Secondary | ICD-10-CM | POA: Diagnosis present

## 2014-06-09 DIAGNOSIS — G43011 Migraine without aura, intractable, with status migrainosus: Secondary | ICD-10-CM

## 2014-06-09 DIAGNOSIS — R001 Bradycardia, unspecified: Secondary | ICD-10-CM | POA: Diagnosis present

## 2014-06-09 DIAGNOSIS — G8929 Other chronic pain: Secondary | ICD-10-CM | POA: Diagnosis present

## 2014-06-09 DIAGNOSIS — G43111 Migraine with aura, intractable, with status migrainosus: Principal | ICD-10-CM | POA: Diagnosis present

## 2014-06-09 DIAGNOSIS — Z79899 Other long term (current) drug therapy: Secondary | ICD-10-CM

## 2014-06-09 NOTE — ED Notes (Signed)
The pt is c/o a headache all the time but this headache ius worse  For the last 3 days.  lmp none iud

## 2014-06-09 NOTE — ED Provider Notes (Signed)
CSN: [id number]     Arrival date & time 06/09/14  2308 History  This chart was scribed for [name], MD by Annye Asa, ED Scribe. This patient was seen in room B18C/B18C and the patient's care was started at 12:46 AM.    Chief Complaint  Patient presents with  . Headache   Patient is a 53 y.o. female presenting with headaches. The history is provided by the patient. No language interpreter was used.  Headache Associated symptoms: nausea, photophobia and vomiting      HPI Comments: Kirsten Walsh is a 53 y.o. female with past medical history of migraine headaches who presents to the Emergency Department complaining of 3 days of worsening right-sided headache with right eye pain and right-sided facial swelling. She describes this pain as sharp, as though she "has an inner ear pain." She notes associated nausea, vomiting, visual disturbance ("silver specks") and photophobia: these symptoms feel similar to her previous migraine headaches. She does not know what triggers her headache; she is not on any medications for breakthrough, though she is managed by a neurologist.   She has been allergic to aspirin for many years (throat swelling). She is unaware of any anaphylactic allergy to Toradol; she has taken Toradol previously.   Past Medical History  Diagnosis Date  . Hypertension   . Chest pain   . Gall stones   . Migraine     "qd" (02/14/2013)  . Chronic lower back pain     "right side to mid back" (02/14/2013)  . Kidney stone ~ 2006   Past Surgical History  Procedure Laterality Date  . Laparoscopic cholecystectomy  ~ 2004  . Cystoscopy w/ stone manipulation  ~ 2006  . Intrauterine device insertion      "initially put in in 04/2002; changed prn" (02/14/2013)   Family History  Problem Relation Age of Onset  . Cancer Mother   . Cirrhosis Father    History  Substance Use Topics  . Smoking status: Never Smoker   . Smokeless tobacco: Never Used  . Alcohol Use: Yes   Comment: 02/14/2013 "socially; couple times/month"   OB History    No data available     Review of Systems  Eyes: Positive for photophobia and visual disturbance.  Gastrointestinal: Positive for nausea and vomiting.  Neurological: Positive for headaches.  All other systems reviewed and are negative.   Allergies  Apple; Aspirin; and Carrot  Home Medications   Prior to Admission medications   Medication Sig Start Date End Date Taking? Authorizing Provider  baclofen (LIORESAL) 10 MG tablet Take 1 tablet (10 mg total) by mouth 3 (three) times daily as needed for muscle spasms. 12/24/13  Yes Glori Luis, MD  cloNIDine (CATAPRES) 0.1 MG tablet TAKE 1 TABLET BY MOUTH TWICE DAILY 04/26/14  Yes Glori Luis, MD  ergotamine-caffeine (CAFERGOT) 1-100 MG per tablet Two tablets at onset of attack; then 1 tablet every 30 minutes as needed; maximum: 6 tablets per attack; do not exceed 10 tablets/week Patient taking differently: Take 2 tablets by mouth every hour as needed for migraine. Two tablets at onset of attack; then 1 tablet every 30 minutes as needed; maximum: 6 tablets per attack; do not exceed 10 tablets/week 04/19/14  Yes [name], MD  ondansetron (ZOFRAN ODT) 4 MG disintegrating tablet Take 1 tablet (4 mg total) by mouth every 6 (six) hours as needed for nausea. 07/22/13  Yes Glori Luis, MD  oxyCODONE (OXY IR/ROXICODONE) 5 MG immediate release  tablet Take 5 mg by mouth. Every 12 hrs prn   Yes Historical Provider, MD  sennosides-docusate sodium (SENOKOT-S) 8.6-50 MG tablet Take 1 tablet by mouth daily as needed for constipation.   Yes Historical Provider, MD  topiramate (TOPAMAX) 100 MG tablet Take 150 mg by mouth 2 (two) times daily.   Yes Historical Provider, MD  EPINEPHrine (EPIPEN 2-PAK) 0.3 mg/0.3 mL IJ SOAJ injection Inject 0.3 mLs (0.3 mg total) into the muscle once. 06/25/14   [name], MD  hydrOXYzine (ATARAX/VISTARIL) 25 MG tablet Take 1 tablet (25 mg total) by  mouth 3 (three) times daily as needed. 06/25/14   [name], MD  promethazine (PHENERGAN) 25 MG tablet Take 1 tablet (25 mg total) by mouth every 6 (six) hours as needed for nausea or vomiting (migraine). 06/13/14   [name], MD  propranolol (INDERAL) 20 MG tablet Take 1 tablet (20 mg total) by mouth 2 (two) times daily. 06/13/14   [name], MD   BP 124/77 mmHg  Pulse 56  Temp(Src) 97.5 F (36.4 C) (Oral)  Resp 19  Ht 5\' 5"  (1.651 m)  Wt 216 lb 12.8 oz (98.34 kg)  BMI 36.08 kg/m2  SpO2 100% Physical Exam  Constitutional: She is oriented to person, place, and time. She appears well-developed and well-nourished.  HENT:  Head: Normocephalic and atraumatic.  Mouth/Throat: Oropharynx is clear and moist. No oropharyngeal exudate.  Eyes: EOM are normal. Pupils are equal, round, and reactive to light.  Pupils are 3mm and equal  Cardiovascular: Normal rate, regular rhythm and normal heart sounds.  Exam reveals no gallop and no friction rub.   No murmur heard. Pulmonary/Chest: Effort normal and breath sounds normal. No respiratory distress. She has no wheezes. She has no rales.  Abdominal: Soft. There is no tenderness. There is no rebound and no guarding.  Musculoskeletal: Normal range of motion. She exhibits no edema.  Neurological: She is alert and oriented to person, place, and time. No cranial nerve deficit. Coordination normal.  Skin: Skin is warm and dry. No rash noted.  Psychiatric: She has a normal mood and affect. Her behavior is normal.  Nursing note and vitals reviewed.   ED Course  Procedures   DIAGNOSTIC STUDIES: Oxygen Saturation is 98% on RA, normal by my interpretation.    COORDINATION OF CARE: 12:53 AM Discussed treatment plan with pt at bedside and pt agreed to plan.  2:13 AM Patient reports that her pain has not improved at this time.  Labs Review Labs Reviewed  CREATININE, SERUM - Abnormal; Notable for the following:    GFR calc non Af  Amer 67 (*)    GFR calc Af Amer 78 (*)    All other components within normal limits  BASIC METABOLIC PANEL - Abnormal; Notable for the following:    CO2 17 (*)    Glucose, Bld 206 (*)    GFR calc non Af Amer 71 (*)    GFR calc Af Amer 82 (*)    All other components within normal limits  CBC - Abnormal; Notable for the following:    WBC 3.7 (*)    All other components within normal limits  PREGNANCY, URINE    Imaging Review No results found.   EKG Interpretation None      MDM   Final diagnoses:  Intractable migraine without aura and with status migrainosus    I personally performed the services described in this documentation, which was scribed in my  presence. The recorded information has been reviewed and is accurate.  PT comes in with cc of headaches. Hx of migraine headaches, and current headaches are characterized as similar to her typical migraines, just more severe. Pt has non focal neuro exam We were unable to abort the headaches in the ER. Discussed appropriate outpatient management and close follow up, after thinking about the options available, pt rather stay admitted and get neurology to see her. Will admit.  Derwood KaplanAnkit Fallyn Munnerlyn, MD 07/01/14 1529

## 2014-06-10 DIAGNOSIS — G43711 Chronic migraine without aura, intractable, with status migrainosus: Secondary | ICD-10-CM | POA: Insufficient documentation

## 2014-06-10 DIAGNOSIS — G43011 Migraine without aura, intractable, with status migrainosus: Secondary | ICD-10-CM

## 2014-06-10 DIAGNOSIS — I10 Essential (primary) hypertension: Secondary | ICD-10-CM | POA: Diagnosis present

## 2014-06-10 DIAGNOSIS — G8929 Other chronic pain: Secondary | ICD-10-CM | POA: Diagnosis present

## 2014-06-10 DIAGNOSIS — R001 Bradycardia, unspecified: Secondary | ICD-10-CM | POA: Diagnosis present

## 2014-06-10 DIAGNOSIS — Z79899 Other long term (current) drug therapy: Secondary | ICD-10-CM | POA: Diagnosis not present

## 2014-06-10 DIAGNOSIS — R51 Headache: Secondary | ICD-10-CM

## 2014-06-10 DIAGNOSIS — M545 Low back pain: Secondary | ICD-10-CM | POA: Diagnosis present

## 2014-06-10 DIAGNOSIS — G43911 Migraine, unspecified, intractable, with status migrainosus: Secondary | ICD-10-CM | POA: Diagnosis present

## 2014-06-10 DIAGNOSIS — G43909 Migraine, unspecified, not intractable, without status migrainosus: Secondary | ICD-10-CM | POA: Insufficient documentation

## 2014-06-10 DIAGNOSIS — G43709 Chronic migraine without aura, not intractable, without status migrainosus: Secondary | ICD-10-CM

## 2014-06-10 DIAGNOSIS — G43111 Migraine with aura, intractable, with status migrainosus: Secondary | ICD-10-CM | POA: Diagnosis not present

## 2014-06-10 LAB — CBC
HCT: 40.9 % (ref 36.0–46.0)
Hemoglobin: 13.8 g/dL (ref 12.0–15.0)
MCH: 30.3 pg (ref 26.0–34.0)
MCHC: 33.7 g/dL (ref 30.0–36.0)
MCV: 89.7 fL (ref 78.0–100.0)
PLATELETS: 213 10*3/uL (ref 150–400)
RBC: 4.56 MIL/uL (ref 3.87–5.11)
RDW: 13.8 % (ref 11.5–15.5)
WBC: 3.7 10*3/uL — ABNORMAL LOW (ref 4.0–10.5)

## 2014-06-10 LAB — PREGNANCY, URINE: PREG TEST UR: NEGATIVE

## 2014-06-10 LAB — BASIC METABOLIC PANEL
ANION GAP: 14 (ref 5–15)
BUN: 7 mg/dL (ref 6–23)
CO2: 17 mEq/L — ABNORMAL LOW (ref 19–32)
Calcium: 8.5 mg/dL (ref 8.4–10.5)
Chloride: 109 mEq/L (ref 96–112)
Creatinine, Ser: 0.92 mg/dL (ref 0.50–1.10)
GFR, EST AFRICAN AMERICAN: 82 mL/min — AB (ref >90)
GFR, EST NON AFRICAN AMERICAN: 71 mL/min — AB (ref >90)
Glucose, Bld: 206 mg/dL — ABNORMAL HIGH (ref 70–99)
Potassium: 3.9 mEq/L (ref 3.7–5.3)
SODIUM: 140 meq/L (ref 137–147)

## 2014-06-10 LAB — CREATININE, SERUM
Creatinine, Ser: 0.96 mg/dL (ref 0.50–1.10)
GFR calc Af Amer: 78 mL/min — ABNORMAL LOW (ref >90)
GFR, EST NON AFRICAN AMERICAN: 67 mL/min — AB (ref >90)

## 2014-06-10 MED ORDER — TOPIRAMATE 25 MG PO TABS
150.0000 mg | ORAL_TABLET | Freq: Two times a day (BID) | ORAL | Status: DC
  Administered 2014-06-10 – 2014-06-13 (×7): 150 mg via ORAL
  Filled 2014-06-10 (×8): qty 2

## 2014-06-10 MED ORDER — PROPRANOLOL HCL 40 MG PO TABS
40.0000 mg | ORAL_TABLET | Freq: Two times a day (BID) | ORAL | Status: DC
  Administered 2014-06-10 – 2014-06-12 (×6): 40 mg via ORAL
  Filled 2014-06-10 (×8): qty 1

## 2014-06-10 MED ORDER — KETOROLAC TROMETHAMINE 30 MG/ML IJ SOLN
30.0000 mg | Freq: Once | INTRAMUSCULAR | Status: AC
  Administered 2014-06-10: 30 mg via INTRAVENOUS
  Filled 2014-06-10: qty 1

## 2014-06-10 MED ORDER — DEXAMETHASONE SODIUM PHOSPHATE 10 MG/ML IJ SOLN
10.0000 mg | Freq: Once | INTRAMUSCULAR | Status: AC
  Administered 2014-06-10: 10 mg via INTRAVENOUS
  Filled 2014-06-10: qty 1

## 2014-06-10 MED ORDER — METOCLOPRAMIDE HCL 5 MG/ML IJ SOLN
10.0000 mg | Freq: Once | INTRAMUSCULAR | Status: AC
  Administered 2014-06-10: 10 mg via INTRAVENOUS
  Filled 2014-06-10: qty 2

## 2014-06-10 MED ORDER — ONDANSETRON HCL 4 MG/2ML IJ SOLN
4.0000 mg | Freq: Once | INTRAMUSCULAR | Status: AC
  Administered 2014-06-10: 4 mg via INTRAVENOUS
  Filled 2014-06-10: qty 2

## 2014-06-10 MED ORDER — DIHYDROERGOTAMINE MESYLATE 1 MG/ML IJ SOLN
1.0000 mg | Freq: Three times a day (TID) | INTRAMUSCULAR | Status: DC
  Administered 2014-06-10 – 2014-06-13 (×9): 1 mg via INTRAVENOUS
  Filled 2014-06-10 (×12): qty 1

## 2014-06-10 MED ORDER — DIPHENHYDRAMINE HCL 50 MG/ML IJ SOLN
25.0000 mg | Freq: Three times a day (TID) | INTRAMUSCULAR | Status: AC
Start: 2014-06-10 — End: 2014-06-10
  Administered 2014-06-10 (×3): 25 mg via INTRAVENOUS
  Filled 2014-06-10 (×3): qty 1

## 2014-06-10 MED ORDER — MAGNESIUM SULFATE 2 GM/50ML IV SOLN
2.0000 g | Freq: Once | INTRAVENOUS | Status: AC
  Administered 2014-06-10: 2 g via INTRAVENOUS
  Filled 2014-06-10: qty 50

## 2014-06-10 MED ORDER — OXYCODONE HCL 5 MG PO TABS
5.0000 mg | ORAL_TABLET | Freq: Two times a day (BID) | ORAL | Status: DC | PRN
  Administered 2014-06-10 – 2014-06-13 (×6): 5 mg via ORAL
  Filled 2014-06-10 (×6): qty 1

## 2014-06-10 MED ORDER — PROMETHAZINE HCL 25 MG/ML IJ SOLN
12.5000 mg | Freq: Three times a day (TID) | INTRAMUSCULAR | Status: AC
  Administered 2014-06-10 (×3): 12.5 mg via INTRAVENOUS
  Filled 2014-06-10 (×3): qty 1

## 2014-06-10 MED ORDER — DEXAMETHASONE SODIUM PHOSPHATE 10 MG/ML IJ SOLN
10.0000 mg | Freq: Three times a day (TID) | INTRAMUSCULAR | Status: AC
  Administered 2014-06-10 (×3): 10 mg via INTRAVENOUS
  Filled 2014-06-10 (×3): qty 1

## 2014-06-10 MED ORDER — METOCLOPRAMIDE HCL 5 MG/ML IJ SOLN
10.0000 mg | Freq: Three times a day (TID) | INTRAMUSCULAR | Status: DC
  Administered 2014-06-10 – 2014-06-13 (×9): 10 mg via INTRAVENOUS
  Filled 2014-06-10 (×14): qty 2

## 2014-06-10 MED ORDER — DIPHENHYDRAMINE HCL 50 MG/ML IJ SOLN
25.0000 mg | Freq: Once | INTRAMUSCULAR | Status: AC
  Administered 2014-06-10: 25 mg via INTRAVENOUS
  Filled 2014-06-10: qty 1

## 2014-06-10 MED ORDER — HEPARIN SODIUM (PORCINE) 5000 UNIT/ML IJ SOLN
5000.0000 [IU] | Freq: Three times a day (TID) | INTRAMUSCULAR | Status: DC
  Administered 2014-06-10 – 2014-06-13 (×10): 5000 [IU] via SUBCUTANEOUS
  Filled 2014-06-10 (×10): qty 1

## 2014-06-10 MED ORDER — CLONIDINE HCL 0.1 MG PO TABS
0.1000 mg | ORAL_TABLET | Freq: Two times a day (BID) | ORAL | Status: DC
  Administered 2014-06-10 – 2014-06-13 (×7): 0.1 mg via ORAL
  Filled 2014-06-10 (×9): qty 1

## 2014-06-10 MED ORDER — SODIUM CHLORIDE 0.9 % IV BOLUS (SEPSIS)
1000.0000 mL | Freq: Once | INTRAVENOUS | Status: AC
  Administered 2014-06-10: 1000 mL via INTRAVENOUS
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
## 2014-06-10 NOTE — Progress Notes (Signed)
UR completed 

## 2014-06-10 NOTE — ED Notes (Signed)
MD at bedside. 

## 2014-06-10 NOTE — ED Notes (Signed)
Pt request to use the bathroom, this RN assisted pt into wheelchair, pt became very nauseated/vomited with movement. Protocol order placed.
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
## 2014-06-10 NOTE — ED Notes (Signed)
Admitting MD at BS.  

## 2014-06-10 NOTE — ED Notes (Signed)
IV team at bedside 

## 2014-06-11 DIAGNOSIS — G43511 Persistent migraine aura without cerebral infarction, intractable, with status migrainosus: Secondary | ICD-10-CM

## 2014-06-11 MED ORDER — ONDANSETRON HCL 4 MG PO TABS
4.0000 mg | ORAL_TABLET | Freq: Once | ORAL | Status: AC
  Administered 2014-06-11: 4 mg via ORAL
  Filled 2014-06-11: qty 1

## 2014-06-11 MED ORDER — PROMETHAZINE HCL 25 MG/ML IJ SOLN
12.5000 mg | Freq: Three times a day (TID) | INTRAMUSCULAR | Status: AC
  Administered 2014-06-11 – 2014-06-12 (×3): 12.5 mg via INTRAVENOUS
  Filled 2014-06-11 (×3): qty 1

## 2014-06-11 NOTE — Progress Notes (Signed)
Pt co nausea, phenergan IV dcd.  Pt would like anything for nausea, Family medicine called back, will put in order for something for nausea.

## 2014-06-11 NOTE — Discharge Instructions (Signed)
Recurrent Migraine Headache °A migraine headache is very bad, throbbing pain on one or both sides of your head. Recurrent migraines keep coming back. Talk to your doctor about what things may bring on (trigger) your migraine headaches. °HOME CARE °· Only take medicines as told by your doctor. °· Lie down in a dark, quiet room when you have a migraine. °· Keep a journal to find out if certain things bring on migraine headaches. For example, write down: °¨ What you eat and drink. °¨ How much sleep you get. °¨ Any change to your diet or medicines. °· Lessen how much alcohol you drink. °· Quit smoking if you smoke. °· Get enough sleep. °· Lessen any stress in your life. °· Keep lights dim if bright lights bother you or make your migraines worse. °GET HELP IF: °· Medicine does not help your migraines. °· Your pain keeps coming back. °· You have a fever. °GET HELP RIGHT AWAY IF:  °· Your migraine becomes really bad. °· You have a stiff neck. °· You have trouble seeing. °· Your muscles are weak, or you lose muscle control. °· You lose your balance or have trouble walking. °· You feel like you will pass out (faint), or you pass out. °· You have really bad symptoms that are different than your first symptoms. °MAKE SURE YOU:  °· Understand these instructions. °· Will watch your condition. °· Will get help right away if you are not doing well or get worse. °Document Released: 03/16/2008 Document Revised: 06/12/2013 Document Reviewed: 02/12/2013 °ExitCare® Patient Information ©2015 [employer]. This information is not intended to replace advice given to you by your health care provider. Make sure you discuss any questions you have with your health care provider. ° °

## 2014-06-11 NOTE — Progress Notes (Signed)
[facility] Medicine [facility] Admission History and Physical Service Pager: [phone]  Patient name: Kirsten Walsh Medical record number: [id number] Date of birth: 02/05/1961 Age: 53 y.o. Gender: female  Primary Care Provider: Marikay AlarSonnenberg, Eric, MD Consultants: Neurology Code Status: Full  Chief Complaint: Migraine  Assessment and Plan: Kirsten Walsh is a 53 y.o. female presenting with intractable migraines. PMH is significant for daily migraines, HTN, chronic back pain.  Intractable Migraine. No red flag symptoms. Long standing history, is [facility] for ongoing management. This is the 9th ED visit this year for migraines. Patient has tried an extensive amount of therapies, with none providing consistent relief. - Consider consult to neurology if no improvement  - Continue home clonidine, propranolol, and topamax - Continue IV migraine cocktail q8h: decadron, phenergan, benadryl - Continue DHE x 9 tx and monitor tele   HTN - Continue current meds  Chronic Back Pain. -Continue home baclofen, oxycodone  FEN/GI: Regular diet, SLIV Prophylaxis: SubQ heparin  Disposition: Pending further improvement   Subjective:  Doing better than yesterday, still nauseated, but otherwise no complaints.    Patient Active Problem List   Diagnosis Date Noted  . Migraine 06/10/2014  . Intractable migraine with status migrainosus 06/10/2014  . Intractable migraine without aura and with status migrainosus   . Chronic daily headache 04/03/2014  . Trapezius muscle spasm 12/25/2013  . Chronic migraine 09/14/2013  . Migraine, chronic, without aura, intractable, with status migrainosus 03/12/2013  . Bradycardia 02/18/2013  . Migraine with status migrainosus 02/14/2013  . Left knee pain 01/16/2013  . Cardiomyopathy 10/20/2011  . Decreased ejection fraction 10/05/2011  . Leg swelling 09/23/2011  . Complex regional pain syndrome  05/03/2011  . Chronic pain 03/29/2011  . KNEE PAIN, RIGHT 05/26/2010  . ARM PAIN, RIGHT 05/06/2010  . NECK PAIN, RIGHT 02/03/2010  . ANKLE PAIN, RIGHT 12/17/2009  . BACK PAIN, ACUTE 11/18/2009  . ALLERGIC RHINITIS 01/30/2009  . DIZZINESS 07/16/2008  . MIGRAINE, COMMON W/INTRACTABLE MIGRAINE 04/26/2007  . OBESITY, NOS 08/18/2006  . HYPERTENSION, BENIGN SYSTEMIC 08/18/2006    No current facility-administered medications on file prior to encounter.   Current Outpatient Prescriptions on File Prior to Encounter  Medication Sig Dispense Refill  . baclofen (LIORESAL) 10 MG tablet Take 1 tablet (10 mg total) by mouth 3 (three) times daily as needed for muscle spasms. 30 each 0  . cloNIDine (CATAPRES) 0.1 MG tablet TAKE 1 TABLET BY MOUTH TWICE DAILY 60 tablet 1  . ergotamine-caffeine (CAFERGOT) 1-100 MG per tablet Two tablets at onset of attack; then 1 tablet every 30 minutes as needed; maximum: 6 tablets per attack; do not exceed 10 tablets/week (Patient taking differently: Take 2 tablets by mouth every hour as needed for migraine. Two tablets at onset of attack; then 1 tablet every 30 minutes as needed; maximum: 6 tablets per attack; do not exceed 10 tablets/week) 10 tablet 0  . ondansetron (ZOFRAN ODT) 4 MG disintegrating tablet Take 1 tablet (4 mg total) by mouth every 6 (six) hours as needed for nausea. 30 tablet 1  . oxyCODONE (OXY IR/ROXICODONE) 5 MG immediate release tablet Take 5 mg by mouth. Every 12 hrs prn    . promethazine (PHENERGAN) 25 MG tablet Take 1 tablet (25 mg total) by mouth every 6 (six) hours as needed (migraine). 30 tablet 0  . propranolol (INDERAL) 20 MG tablet Take 2 tablets (40 mg total) by mouth 2 (two) times daily. 180 tablet 1  .  sennosides-docusate sodium (SENOKOT-S) 8.6-50 MG tablet Take 1 tablet by mouth daily as needed for constipation.    . topiramate (TOPAMAX) 100 MG tablet Take 150 mg by mouth 2 (two) times daily.      Objective: BP 146/77 mmHg  Pulse 56   Temp(Src) 98.1 F (36.7 C) (Oral)  Resp 16  Ht 5\' 5"  (1.651 m)  Wt 210 lb 3.2 oz (95.346 kg)  BMI 34.98 kg/m2  SpO2 97% Exam: General: Alert, Cooperative, lying in hospital bed in NAD HEENT: [place]/AT  Cardiovascular: RRR, no murmurs appreciated Respiratory: NWOB, CTAB, no wheezes Abdomen: S, NT, ND Extremities: No cyanosis Skin: No rashes or areas of skin breakdown Neuro: AAO x 3, no focal deficits    Briscoe DeutscherBryan R Kyleigh Nannini, DO 06/11/2014, 9:13 AM PGY-3, [facility] FPTS Intern pager: [phone], text pages welcome

## 2014-06-11 NOTE — Discharge Summary (Signed)
Physician Discharge Summary  Patient ID: Kirsten Walsh MRN: [id number] DOB: May 24, 1961 Age: 53 y.o.  Admit date: 06/09/2014 Discharge date: 06/13/2014 Admitting Physician: Nestor RampSara L Neal, MD  PCP: Marikay AlarSonnenberg, Shevy Yaney, MD  Consultants:None     Discharge Diagnosis: Principal Problem:   Migraine with status migrainosus Active Problems:   HYPERTENSION, BENIGN SYSTEMIC   Chronic migraine   Chronic daily headache   Intractable migraine with status migrainosus    Hospital Course Kirsten Walsh is a 53 y.o. female presenting with intractable migraines. PMH is significant for daily migraines, HTN, chronic back pain.  Problem List 1. Intractable Migraine w/ aura, no evidence of infarct - Pt presented to the ED with ongoing migraine that was not controlled with home medication.  This was typical for her daily migraine and the ED gave her multiple doses of Benadryl, Toradol, Zofran, Decadron, and Mg Sulfate w/o relief. She was continued on benadryl, decadron, and phenergan and had no improvement in symptoms. She was then started on the DHE protocol after a upreg was negative.  This slowly improved her Sx and she received 9 doses of DHE prior to discharge. At time of discharge patient reported improvement in her headache, though continued to have some headache. She continued to have nausea, so was discharged home with phenergan.  2. HTN - Slightly elevated upon admission but improved during stay.  Thought to be 2/2 pain.   3. Bradycardia - in the 70's at admission. Throughout hospitalization HR found to be in the 44-62 range. This was felt to be related to forced propranolol usage and this was decreased to 20 mg BID. Will need follow-up of this as an outpatient.          Discharge PE   Filed Vitals:   06/13/14 1051  BP: 124/77  Pulse: 56  Temp: 97.5 F (36.4 C)  Resp: 19   General: Alert, Cooperative, lying in hospital bed in NAD HEENT: [place]/AT  Cardiovascular: RRR, no  murmurs appreciated Respiratory: NWOB, CTAB, no wheezes Abdomen: S, NT, ND Extremities: No cyanosis Skin: No rashes or areas of skin breakdown Neuro: AAO x 3, no focal deficits   CBC  Recent Labs Lab 06/10/14 1127  WBC 3.7*  HGB 13.8  HCT 40.9  PLT 213   BMET  Recent Labs Lab 06/10/14 0715 06/10/14 1127  NA  --  140  K  --  3.9  CL  --  109  CO2  --  17*  BUN  --  7  CREATININE 0.96 0.92  CALCIUM  --  8.5  GLUCOSE  --  206*   U preg - negative    Patient condition at time of discharge/disposition: stable  Disposition-home   Follow up issues: 1. Migraines - options for additional prophylactic treatment.   2. Bradycardia - propranolol dose decreased during hospitalization  3. Nausea - related to migraine. Still nauseated at time of discharge.   Discharge follow up:  Follow-up Information    Follow up with Hazeline JunkerGrunz, Ryan, MD.   Specialty:  Family Medicine   Why:  Monday 06/17/14 @ 8:30 AM    Contact information:   [address] [place] [address] [phone]      Discharge Instructions    Diet - low sodium heart healthy    Complete by:  As directed      Increase activity slowly    Complete by:  As directed  Follow-up Information    Follow up with Hazeline JunkerGrunz, Ryan, MD.   Specialty:  Family Medicine   Why:  Monday 06/17/14 @ 8:30 AM    Contact information:   [address] [place] [address] [phone]      Discharge Instructions: Please refer to Patient Instructions section of EMR for full details.  Patient was counseled important signs and symptoms that should prompt return to medical care, changes in medications, dietary instructions, activity restrictions, and follow up appointments.  Significant instructions noted below:    Discharge Medications   Medication List    TAKE these medications        baclofen 10 MG tablet  Commonly known as:  LIORESAL  Take 1 tablet (10 mg total) by mouth 3 (three) times daily as needed  for muscle spasms.     cloNIDine 0.1 MG tablet  Commonly known as:  CATAPRES  TAKE 1 TABLET BY MOUTH TWICE DAILY     EPIPEN 2-PAK 0.3 mg/0.3 mL Soaj injection  Generic drug:  EPINEPHrine  Inject 0.3 mg into the muscle once.     ergotamine-caffeine 1-100 MG per tablet  Commonly known as:  CAFERGOT  Two tablets at onset of attack; then 1 tablet every 30 minutes as needed; maximum: 6 tablets per attack; do not exceed 10 tablets/week     ondansetron 4 MG disintegrating tablet  Commonly known as:  ZOFRAN ODT  Take 1 tablet (4 mg total) by mouth every 6 (six) hours as needed for nausea.     oxyCODONE 5 MG immediate release tablet  Commonly known as:  Oxy IR/ROXICODONE  Take 5 mg by mouth. Every 12 hrs prn     promethazine 25 MG tablet  Commonly known as:  PHENERGAN  Take 1 tablet (25 mg total) by mouth every 6 (six) hours as needed for nausea or vomiting (migraine).     propranolol 20 MG tablet  Commonly known as:  INDERAL  Take 1 tablet (20 mg total) by mouth 2 (two) times daily.     sennosides-docusate sodium 8.6-50 MG tablet  Commonly known as:  SENOKOT-S  Take 1 tablet by mouth daily as needed for constipation.     topiramate 100 MG tablet  Commonly known as:  TOPAMAX  Take 150 mg by mouth 2 (two) times daily.        Marikay AlarEric Hildred Pharo, MD [facility] PGY3

## 2014-06-12 NOTE — Progress Notes (Signed)
Heparin

## 2014-06-12 NOTE — Progress Notes (Signed)
High fall, photosensitivity

## 2014-06-12 NOTE — Progress Notes (Signed)
[facility] History and Physical Service Pager: [phone]  Patient name: Kirsten Walsh Medical record number: [id number] Date of birth: Oct 05, 1960 Age: 53 y.o. Gender: female  Primary Care Provider: Marikay AlarSonnenberg, Eric, MD Consultants: Neurology Code Status: Full  Chief Complaint: Migraine  Assessment and Plan: Kirsten Walsh is a 53 y.o. female presenting with intractable migraines. PMH is significant for daily migraines, HTN, chronic back pain.  Intractable Migraine. No red flag symptoms. Long standing history, is [facility] for ongoing management. This is the 9th ED visit this year for migraines. Patient has tried an extensive amount of therapies, with none providing consistent relief. - Consider consult to neurology if no improvement  - Continue home clonidine, propranolol, and topamax - Continue IV migraine cocktail q8h: decadron, phenergan, benadryl - Continue DHE x 9 tx and monitor tele  HTN - Continue current meds  Chronic Back Pain. -Continue home baclofen, oxycodone  FEN/GI: Regular diet, SLIV Prophylaxis: SubQ heparin  Disposition: Pending further improvement   Subjective:  Nausea improved since yesterday.  HA improving as well.    Patient Active Problem List   Diagnosis Date Noted  . Migraine 06/10/2014  . Intractable migraine with status migrainosus 06/10/2014  . Intractable migraine without aura and with status migrainosus   . Chronic daily headache 04/03/2014  . Trapezius muscle spasm 12/25/2013  . Chronic migraine 09/14/2013  . Migraine, chronic, without aura, intractable, with status migrainosus 03/12/2013  . Bradycardia 02/18/2013  . Migraine with status migrainosus 02/14/2013  . Left knee pain 01/16/2013  . Cardiomyopathy 10/20/2011  . Decreased ejection fraction 10/05/2011  . Leg swelling 09/23/2011  . Complex regional pain syndrome 05/03/2011  .  Chronic pain 03/29/2011  . KNEE PAIN, RIGHT 05/26/2010  . ARM PAIN, RIGHT 05/06/2010  . NECK PAIN, RIGHT 02/03/2010  . ANKLE PAIN, RIGHT 12/17/2009  . BACK PAIN, ACUTE 11/18/2009  . ALLERGIC RHINITIS 01/30/2009  . DIZZINESS 07/16/2008  . MIGRAINE, COMMON W/INTRACTABLE MIGRAINE 04/26/2007  . OBESITY, NOS 08/18/2006  . HYPERTENSION, BENIGN SYSTEMIC 08/18/2006    No current facility-administered medications on file prior to encounter.   Current Outpatient Prescriptions on File Prior to Encounter  Medication Sig Dispense Refill  . baclofen (LIORESAL) 10 MG tablet Take 1 tablet (10 mg total) by mouth 3 (three) times daily as needed for muscle spasms. 30 each 0  . cloNIDine (CATAPRES) 0.1 MG tablet TAKE 1 TABLET BY MOUTH TWICE DAILY 60 tablet 1  . ergotamine-caffeine (CAFERGOT) 1-100 MG per tablet Two tablets at onset of attack; then 1 tablet every 30 minutes as needed; maximum: 6 tablets per attack; do not exceed 10 tablets/week (Patient taking differently: Take 2 tablets by mouth every hour as needed for migraine. Two tablets at onset of attack; then 1 tablet every 30 minutes as needed; maximum: 6 tablets per attack; do not exceed 10 tablets/week) 10 tablet 0  . ondansetron (ZOFRAN ODT) 4 MG disintegrating tablet Take 1 tablet (4 mg total) by mouth every 6 (six) hours as needed for nausea. 30 tablet 1  . oxyCODONE (OXY IR/ROXICODONE) 5 MG immediate release tablet Take 5 mg by mouth. Every 12 hrs prn    . promethazine (PHENERGAN) 25 MG tablet Take 1 tablet (25 mg total) by mouth every 6 (six) hours as needed (migraine). 30 tablet 0  . propranolol (INDERAL) 20 MG tablet Take 2 tablets (40 mg total) by mouth 2 (two) times daily. 180 tablet 1  .  sennosides-docusate sodium (SENOKOT-S) 8.6-50 MG tablet Take 1 tablet by mouth daily as needed for constipation.    . topiramate (TOPAMAX) 100 MG tablet Take 150 mg by mouth 2 (two) times daily.      Objective: BP 146/70 mmHg  Pulse 48  Temp(Src) 98.4  F (36.9 C) (Oral)  Resp 17  Ht 5\' 5"  (1.651 m)  Wt 214 lb (97.07 kg)  BMI 35.61 kg/m2  SpO2 100% Exam: General: Alert, Cooperative, lying in hospital bed in NAD HEENT: [place]/AT  Cardiovascular: RRR, no murmurs appreciated Respiratory: NWOB, CTAB, no wheezes Abdomen: S, NT, ND Extremities: No cyanosis Skin: No rashes or areas of skin breakdown Neuro: AAO x 3, no focal deficits    Briscoe DeutscherBryan R Renezmae Canlas, DO 06/12/2014, 8:47 AM PGY-3, [place] [facility] FPTS Intern pager: [phone], text pages welcome

## 2014-06-13 DIAGNOSIS — G43711 Chronic migraine without aura, intractable, with status migrainosus: Secondary | ICD-10-CM

## 2014-06-13 MED ORDER — PROPRANOLOL HCL 20 MG PO TABS
20.0000 mg | ORAL_TABLET | Freq: Two times a day (BID) | ORAL | Status: DC
  Administered 2014-06-13: 20 mg via ORAL
  Filled 2014-06-13 (×2): qty 1

## 2014-06-13 MED ORDER — PROMETHAZINE HCL 25 MG PO TABS: 25.0000 mg | ORAL_TABLET | Freq: Four times a day (QID) | ORAL | Status: DC | PRN

## 2014-06-13 MED ORDER — PROPRANOLOL HCL 20 MG PO TABS: 20.0000 mg | ORAL_TABLET | Freq: Two times a day (BID) | ORAL | Status: DC

## 2014-06-13 NOTE — Progress Notes (Signed)
Discharge instructions reviewed with the patient. Prescriptions and D/C medications reviewed with patient.  Follow up appointments reviewed with patient. Patient voices understanding to teaching. Printed copies given to patient. To the door via wheelchair. Home via POV with her son driving

## 2014-06-17 ENCOUNTER — Ambulatory Visit: Payer: Medicaid Other | Admitting: Family Medicine

## 2014-06-25 ENCOUNTER — Ambulatory Visit (INDEPENDENT_AMBULATORY_CARE_PROVIDER_SITE_OTHER): Payer: Medicaid Other | Admitting: Family Medicine

## 2014-06-25 ENCOUNTER — Encounter: Payer: Self-pay | Admitting: Family Medicine

## 2014-06-25 VITALS — BP 142/90 | HR 77 | Temp 98.1°F | Ht 65.0 in | Wt 212.0 lb

## 2014-06-25 DIAGNOSIS — IMO0002 Reserved for concepts with insufficient information to code with codable children: Secondary | ICD-10-CM

## 2014-06-25 DIAGNOSIS — Z87892 Personal history of anaphylaxis: Secondary | ICD-10-CM

## 2014-06-25 DIAGNOSIS — I10 Essential (primary) hypertension: Secondary | ICD-10-CM

## 2014-06-25 DIAGNOSIS — G43709 Chronic migraine without aura, not intractable, without status migrainosus: Secondary | ICD-10-CM

## 2014-06-25 DIAGNOSIS — R110 Nausea: Secondary | ICD-10-CM

## 2014-06-25 MED ORDER — HYDROXYZINE HCL 25 MG PO TABS: 25.0000 mg | ORAL_TABLET | Freq: Three times a day (TID) | ORAL | Status: DC | PRN

## 2014-06-25 MED ORDER — EPINEPHRINE 0.3 MG/0.3ML IJ SOAJ: 0.3000 mg | Freq: Once | INTRAMUSCULAR | Status: DC

## 2014-06-25 NOTE — Patient Instructions (Signed)
Nice to see you. Please take your topamax when you get home.  You can try the atarax for your nausea.  Please pick up the zoloft that the neurologist prescribed. This should be taken as 25 mg daily for 7 days then increase to 50 mg daily.  Please call to reschedule your neurology appointment.

## 2014-06-26 DIAGNOSIS — Z87892 Personal history of anaphylaxis: Secondary | ICD-10-CM | POA: Insufficient documentation

## 2014-06-26 HISTORY — DX: Personal history of anaphylaxis: Z87.892

## 2014-06-26 NOTE — Assessment & Plan Note (Addendum)
Continues to have issues with headaches. She has no neuro deficits at this time. Discussed that we will likely not be able to resolve her headaches completely, though the goal should be to get to a point where she does not need to be hospitalized as frequently. Discussed starting zoloft as neurology advised last visit. Patient to do this. She is to take her topamax once home as she has not taken this yet. Will try atarax for nausea. She needs to reschedule f/u with neurology. Advised on reasons to go to ED.

## 2014-06-26 NOTE — Assessment & Plan Note (Signed)
No recent issues with this. Epi pen refilled.

## 2014-06-26 NOTE — Progress Notes (Signed)
Patient ID: Kirsten Walsh, female   DOB: [birth year], 54 y.o.   MRN: [id number]  Kirsten AlarEric Treavon Castilleja, MD Phone: [phone]  Sydnee CabalConstance M Lanae Walsh is a 54 y.o. female who presents today for f/u.  Migraine headaches: patient presents for hospital follow-up. She states her migraines have been a little better, though has had increase in headache over the past day. She notes it hurts no the right side. + photophobia and phonophobia. Notes increase nausea. Denies syncope. States overall her migraines have improved over the past year. Has been taking topamax, clonidine, propranolol, and oxycodone for HA. Also phenergan for nausea. Missed her recent neuro appointment. Previously they discussed adding zoloft, though patient did not do this.   HYPERTENSION Disease Monitoring Home BP Monitoring is checking, states elevated if has headache Chest pain- no    Dyspnea- no Medications Compliance-  Taking.  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 Needs a refill on epipen as her's are out of date. Last used epipen 2 years ago.    Patient is a nonsmoker.   ROS: Per HPI   Physical Exam Filed Vitals:   06/25/14 1035  BP: 142/90  Pulse: 77  Temp: 98.1 F (36.7 C)    Gen: Well NAD HEENT: PERRL,  MMM Lungs: CTABL Nl WOB Heart: RRR  Neuro: CN 2-12 intact, 5/5 strength in bilateral biceps, triceps, grip, quads, hamstrings, plantar and dorsiflexion, sensation to light touch intact in bilateral UE and LE, normal gait, 2+ patellar reflexes Exts: Non edematous BL  LE, warm and well perfused.    Assessment/Plan: Please see individual problem list.  Kirsten AlarEric Marke Goodwyn, MD [facility] PGY-3

## 2014-06-26 NOTE — Assessment & Plan Note (Signed)
At goal on recheck. Likely elevated related to HA. Will continue current regimen. Monitor at next visit.

## 2014-07-04 ENCOUNTER — Ambulatory Visit: Payer: Medicaid Other

## 2014-07-18 ENCOUNTER — Ambulatory Visit (INDEPENDENT_AMBULATORY_CARE_PROVIDER_SITE_OTHER): Payer: Medicaid Other | Admitting: Family Medicine

## 2014-07-18 VITALS — BP 170/90 | HR 64 | Temp 97.5°F | Ht 65.0 in | Wt 215.4 lb

## 2014-07-18 DIAGNOSIS — G43511 Persistent migraine aura without cerebral infarction, intractable, with status migrainosus: Secondary | ICD-10-CM

## 2014-07-18 DIAGNOSIS — Z975 Presence of (intrauterine) contraceptive device: Secondary | ICD-10-CM

## 2014-07-18 DIAGNOSIS — Z30433 Encounter for removal and reinsertion of intrauterine contraceptive device: Secondary | ICD-10-CM

## 2014-07-18 DIAGNOSIS — I10 Essential (primary) hypertension: Secondary | ICD-10-CM

## 2014-07-18 DIAGNOSIS — R109 Unspecified abdominal pain: Secondary | ICD-10-CM

## 2014-07-18 MED ORDER — LEVONORGESTREL 20 MCG/24HR IU IUD
INTRAUTERINE_SYSTEM | Freq: Once | INTRAUTERINE | Status: AC
  Administered 2014-07-18: 1 via INTRAUTERINE

## 2014-07-18 MED ORDER — KETOROLAC TROMETHAMINE 60 MG/2ML IM SOLN
60.0000 mg | Freq: Once | INTRAMUSCULAR | Status: AC
  Administered 2014-07-18: 60 mg via INTRAMUSCULAR

## 2014-07-18 MED ORDER — ONDANSETRON 4 MG PO TBDP
4.0000 mg | ORAL_TABLET | Freq: Once | ORAL | Status: AC
  Administered 2014-07-18: 4 mg via ORAL

## 2014-07-18 NOTE — Progress Notes (Signed)
Patient ID: Kirsten MillersConstance M Walsh, female   DOB: [birth year], 54 y.o.   MRN: [id number] Patient here for removal current IUD (expired) and re-insertion new IUD. Having a migraine headache this morning and says that is why her BP is elevated. Opts to go ahead with procedure. Migraine is typical for her. OBJ (initial): WD WN NAD NEURO/PSYCH: no gross focal neurologic deficits.PERRL, EOMI. Not having photophobia. Affect is somewhat flat but asks and answers questions appropriately. Follows commands.Independent in changing her clothes, putting on gown,transition from chair to table. OBJ at end of procedure: NEURO/PSYCH:  After her episode of dry heaves at the end of the procedure, she was feeling a little dizzy and 'seeing stars" so we helped her down from the procedure table, she changed her clothes independently and we assisted her with transfer to a separate patient room where she could rest more quietly but still be observed,   PROCEDURE NOTE: IUD REMOVAL AND RE-INSERTION IUD REMOVAL: Patient given informed consent for IUD removal and re-insertion of new IUD. Signed copy in the chart. Appropriate time out taken. Sterile technique used. Patient placed in the lithotomy position and the cervix brought into view using speculum. The IUD strings were identified coming from the cervical os. These strings were grasped with ring forceps, and the IUD withdrawn gently from the uterus. There were no complications and no blood loss.   IUD RE-INSERTION: Sterile technique maintained. Cervix cleansed three times with  betadine swabs.  A tenaculum was placed into the anterior lip of the cervix and a uterine sound was used to measure uterine size.  A MIRENA IUD was placed into the endometrial cavity, deployed and secured. The applicator was removed. The strings were trimmed to 2 centimeters. All equipment was removed and accounted for.  Patient had several episodes of dry heaving a the  completion of the IUD insertion  process. We gave her zofran PO (sublingiual) and 60 mg IM toradol for her migraine. We placed her in observation in the clinic where she was made comfortable in a darkened room until her ride could come for her. She voiced mild improvement in her headache and significant improvement in her nausea at the time of discharge home with her friend (Mr. Silvano RuskClodfelter). There were no complications other than her symptoms of nausea and headache from her migraine.  She was given handouts for post procedure instructions and information about the IUD including a card with the time of recommended removal.

## 2014-07-18 NOTE — Addendum Note (Signed)
Addended by: Jone BasemanFLEEGER, JESSICA D on: 07/18/2014 04:22 PM   Modules accepted: Orders

## 2014-07-18 NOTE — Assessment & Plan Note (Signed)
Elevated BP today. Patient reports she is in the middle of a typical migraine (hx migraines and was just hospitalized for same). She is not having any chest pain or SOB.

## 2014-07-31 ENCOUNTER — Emergency Department (HOSPITAL_BASED_OUTPATIENT_CLINIC_OR_DEPARTMENT_OTHER)
Admission: EM | Admit: 2014-07-31 | Discharge: 2014-07-31 | Disposition: A | Discharge status: Home / Self Care | Payer: Medicaid Other | Attending: Emergency Medicine | Admitting: Emergency Medicine

## 2014-07-31 ENCOUNTER — Encounter (HOSPITAL_BASED_OUTPATIENT_CLINIC_OR_DEPARTMENT_OTHER): Payer: Self-pay | Admitting: Emergency Medicine

## 2014-07-31 DIAGNOSIS — G43819 Other migraine, intractable, without status migrainosus: Secondary | ICD-10-CM | POA: Insufficient documentation

## 2014-07-31 DIAGNOSIS — G8929 Other chronic pain: Secondary | ICD-10-CM | POA: Insufficient documentation

## 2014-07-31 DIAGNOSIS — R112 Nausea with vomiting, unspecified: Secondary | ICD-10-CM

## 2014-07-31 DIAGNOSIS — Z8719 Personal history of other diseases of the digestive system: Secondary | ICD-10-CM | POA: Diagnosis not present

## 2014-07-31 DIAGNOSIS — I10 Essential (primary) hypertension: Secondary | ICD-10-CM | POA: Diagnosis not present

## 2014-07-31 DIAGNOSIS — Z79899 Other long term (current) drug therapy: Secondary | ICD-10-CM | POA: Insufficient documentation

## 2014-07-31 DIAGNOSIS — G43909 Migraine, unspecified, not intractable, without status migrainosus: Secondary | ICD-10-CM | POA: Diagnosis present

## 2014-07-31 DIAGNOSIS — Z87442 Personal history of urinary calculi: Secondary | ICD-10-CM | POA: Insufficient documentation

## 2014-07-31 LAB — CBC WITH DIFFERENTIAL/PLATELET
Basophils Absolute: 0 10*3/uL (ref 0.0–0.1)
Basophils Relative: 1 % (ref 0–1)
Eosinophils Absolute: 0.2 10*3/uL (ref 0.0–0.7)
Eosinophils Relative: 5 % (ref 0–5)
HCT: 41.6 % (ref 36.0–46.0)
Hemoglobin: 13.8 g/dL (ref 12.0–15.0)
LYMPHS PCT: 37 % (ref 12–46)
Lymphs Abs: 1.8 10*3/uL (ref 0.7–4.0)
MCH: 30.4 pg (ref 26.0–34.0)
MCHC: 33.2 g/dL (ref 30.0–36.0)
MCV: 91.6 fL (ref 78.0–100.0)
MONO ABS: 0.5 10*3/uL (ref 0.1–1.0)
Monocytes Relative: 9 % (ref 3–12)
Neutro Abs: 2.3 10*3/uL (ref 1.7–7.7)
Neutrophils Relative %: 48 % (ref 43–77)
Platelets: 227 10*3/uL (ref 150–400)
RBC: 4.54 MIL/uL (ref 3.87–5.11)
RDW: 13.2 % (ref 11.5–15.5)
WBC: 4.8 10*3/uL (ref 4.0–10.5)

## 2014-07-31 LAB — BASIC METABOLIC PANEL
Anion gap: 1 — ABNORMAL LOW (ref 5–15)
BUN: 14 mg/dL (ref 6–23)
CALCIUM: 8.5 mg/dL (ref 8.4–10.5)
CHLORIDE: 113 mmol/L — AB (ref 96–112)
CO2: 23 mmol/L (ref 19–32)
CREATININE: 1.07 mg/dL (ref 0.50–1.10)
GFR calc Af Amer: 68 mL/min — ABNORMAL LOW (ref >90)
GFR calc non Af Amer: 59 mL/min — ABNORMAL LOW (ref >90)
Glucose, Bld: 110 mg/dL — ABNORMAL HIGH (ref 70–99)
Potassium: 3.3 mmol/L — ABNORMAL LOW (ref 3.5–5.1)
Sodium: 137 mmol/L (ref 135–145)

## 2014-07-31 MED ORDER — HYDROMORPHONE HCL 1 MG/ML IJ SOLN
1.0000 mg | Freq: Once | INTRAMUSCULAR | Status: AC
  Administered 2014-07-31: 1 mg via INTRAVENOUS
  Filled 2014-07-31: qty 1

## 2014-07-31 MED ORDER — VALPROATE SODIUM 500 MG/5ML IV SOLN
INTRAVENOUS | Status: AC
  Administered 2014-07-31: 500 mg via INTRAVENOUS
  Filled 2014-07-31: qty 5

## 2014-07-31 MED ORDER — MAGNESIUM SULFATE 2 GM/50ML IV SOLN
2.0000 g | Freq: Once | INTRAVENOUS | Status: AC
  Administered 2014-07-31: 2 g via INTRAVENOUS
  Filled 2014-07-31: qty 50

## 2014-07-31 MED ORDER — VALPROATE SODIUM 500 MG/5ML IV SOLN: 500.0000 mg | Freq: Once | INTRAVENOUS | Status: AC

## 2014-07-31 MED ORDER — ONDANSETRON HCL 4 MG/2ML IJ SOLN
4.0000 mg | Freq: Once | INTRAMUSCULAR | Status: AC
  Administered 2014-07-31: 4 mg via INTRAVENOUS
  Filled 2014-07-31: qty 2

## 2014-07-31 MED ORDER — DIPHENHYDRAMINE HCL 50 MG/ML IJ SOLN
25.0000 mg | Freq: Once | INTRAMUSCULAR | Status: AC
  Administered 2014-07-31: 25 mg via INTRAVENOUS
  Filled 2014-07-31: qty 1

## 2014-07-31 MED ORDER — METOCLOPRAMIDE HCL 5 MG/ML IJ SOLN
10.0000 mg | Freq: Once | INTRAMUSCULAR | Status: AC
  Administered 2014-07-31: 10 mg via INTRAVENOUS
  Filled 2014-07-31: qty 2

## 2014-07-31 MED ORDER — PROMETHAZINE HCL 25 MG/ML IJ SOLN
25.0000 mg | Freq: Once | INTRAMUSCULAR | Status: AC
  Administered 2014-07-31: 25 mg via INTRAVENOUS
  Filled 2014-07-31: qty 1

## 2014-07-31 MED ORDER — SODIUM CHLORIDE 0.9 % IV BOLUS (SEPSIS)
1000.0000 mL | Freq: Once | INTRAVENOUS | Status: AC
  Administered 2014-07-31: 1000 mL via INTRAVENOUS

## 2014-07-31 NOTE — ED Notes (Signed)
Depacon finished infusing.

## 2014-07-31 NOTE — ED Notes (Signed)
MD at bedside. 
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
## 2014-07-31 NOTE — ED Notes (Addendum)
Migraine x1 week.  Worse today. Nausea.Came in by EMS.  States she passed out this afternoon but did not fall.

## 2014-08-14 ENCOUNTER — Telehealth: Payer: Self-pay | Admitting: Family Medicine

## 2014-08-14 NOTE — Telephone Encounter (Signed)
Completed and placed in to fax pile.

## 2014-08-14 NOTE — Telephone Encounter (Signed)
[facility] on 07/25/14 for completion.  Office is calling to ask that forms be completed and returned asap.  Patient waiting for finalization of application process.  Need documents to complete application. Please fax back to number on cover sheet.  If there are any questions regarding, may contact Ms. Barry Dieneswens.

## 2014-08-15 ENCOUNTER — Telehealth: Payer: Self-pay | Admitting: Family Medicine

## 2014-08-16 ENCOUNTER — Telehealth: Payer: Self-pay | Admitting: Family Medicine

## 2014-08-16 NOTE — Telephone Encounter (Signed)
Patient dropped request to PCP to provide certain information (detailed on paper) in order to get a refund from a cruise trip she could not take since she was at the hospital. Please, follow up with Patient.

## 2014-09-05 ENCOUNTER — Ambulatory Visit (INDEPENDENT_AMBULATORY_CARE_PROVIDER_SITE_OTHER): Payer: Medicaid Other | Admitting: Family Medicine

## 2014-09-05 VITALS — BP 128/90 | HR 73 | Temp 98.7°F | Ht 65.0 in | Wt 215.3 lb

## 2014-09-05 DIAGNOSIS — R609 Edema, unspecified: Secondary | ICD-10-CM

## 2014-09-05 DIAGNOSIS — G43709 Chronic migraine without aura, not intractable, without status migrainosus: Secondary | ICD-10-CM

## 2014-09-05 DIAGNOSIS — IMO0002 Reserved for concepts with insufficient information to code with codable children: Secondary | ICD-10-CM

## 2014-09-05 DIAGNOSIS — I10 Essential (primary) hypertension: Secondary | ICD-10-CM

## 2014-09-05 DIAGNOSIS — R600 Localized edema: Secondary | ICD-10-CM

## 2014-09-05 LAB — BASIC METABOLIC PANEL
BUN: 10 mg/dL (ref 6–23)
CALCIUM: 8.8 mg/dL (ref 8.4–10.5)
CO2: 19 meq/L (ref 19–32)
Chloride: 110 mEq/L (ref 96–112)
Creat: 1.06 mg/dL (ref 0.50–1.10)
GLUCOSE: 92 mg/dL (ref 70–99)
POTASSIUM: 3.9 meq/L (ref 3.5–5.3)
Sodium: 139 mEq/L (ref 135–145)

## 2014-09-05 MED ORDER — PROMETHAZINE HCL 25 MG PO TABS: 25.0000 mg | ORAL_TABLET | Freq: Four times a day (QID) | ORAL | Status: DC | PRN

## 2014-09-05 MED ORDER — LISINOPRIL 5 MG PO TABS: 5.0000 mg | ORAL_TABLET | Freq: Every day | ORAL | Status: DC

## 2014-09-05 NOTE — Patient Instructions (Addendum)
Nice to see you. We are going to check an echo to evaluate your heart given the swelling in your right side.  We are also going to get some labs. We will let you know the results.  If the swelling worsens, you develop chest pain, shortness of breath, redness of the extermities, or shortness of breath when laying down please seek medical attention.  You also need a mammogram.

## 2014-09-06 ENCOUNTER — Encounter: Payer: Self-pay | Admitting: Family Medicine

## 2014-09-06 LAB — BRAIN NATRIURETIC PEPTIDE: Brain Natriuretic Peptide: 2.8 pg/mL (ref 0.0–100.0)

## 2014-09-07 ENCOUNTER — Encounter: Payer: Self-pay | Admitting: Family Medicine

## 2014-09-07 DIAGNOSIS — R609 Edema, unspecified: Secondary | ICD-10-CM | POA: Insufficient documentation

## 2014-09-07 NOTE — Assessment & Plan Note (Signed)
Migraines chronic and stable. Has had increase in nausea and vomiting recently and this could potentially be related to medication vs worsening or her migraines. I asked the patient to set up f/u with Dr Karel JarvisAquino for this issue. Advised that I would be more than happy to discuss this with her pain management physician, though would like for her to see Dr Karel JarvisAquino first. Also asked that she request her notes be sent from pain management to our office when ever she has a visit with them as I have not received notes in some time. Neurologically intact. Will continue current regimen at this time. Given return precautions.

## 2014-09-07 NOTE — Progress Notes (Signed)
Patient ID: Kirsten Walsh, female   DOB: 23-Jan-1961, 54 y.o.   MRN: [id number]  Marikay AlarEric Decarla Siemen, MD Phone: [phone]  Kirsten Walsh is a 54 y.o. female who presents today for f/u.  Migraine headaches: is followed by Dr Roger ShelterGordon at a pain clinic and Dr Karel JarvisAquino from neurology. States Dr Roger ShelterGordon advised her that she was on too many medications for her headaches and she needed to discuss coming off of some of them with myself and Dr Karel JarvisAquino. She was given fiorecet for her headaches on top of her current medications at her last pain clinic visit. Only has a mild headache at this time. She notes nausea and vomiting. She notes that her right side feels weak though is at its baseline. She has been taking topamax, zoloft, propranolol, and clonidine for her headaches. Has not followed up with neuro in a number of months.   Right arm and leg swelling: notes this has been present for a couple of months. The swelling is equal in both right arm and leg. No swelling in left side of body. Notes these extremities tingle. No pain in these limbs. Denies orthopnea, blood clot history, recent trips, or surgeries. Was hospitalized in December, though this started a couple weeks after discharge.  HYPERTENSION Disease Monitoring Home BP Monitoring checking, 160-170's systolic at home Chest pain- no    Dyspnea- no Medications Compliance-  Taking clonidine and propranolol.  Edema- yes, see above   PMH: nonsmoker.   ROS: Per HPI   Physical Exam Filed Vitals:   09/05/14 0917  BP: 128/90  Pulse:   Temp:     Gen: Well NAD HEENT: PERRL,  MMM Lungs: CTABL Nl WOB Heart: RRR  MSK: right arm and leg with slight minimal size difference noted compared to the left, there are no cords in either calf or her RUE, she does report tingling in right leg with palpation of calf, negative homans bilaterally, no erythema or warmth to either extremity  Neuro: CN 2-12 intact, 5/5 strength in bilateral biceps,  triceps, grip, quads, hamstrings, plantar and dorsiflexion, sensation to light touch intact in bilateral UE and LE, normal gait, 2+ patellar reflexes Exts: Non edematous BL  LE, warm and well perfused.    Assessment/Plan: Please see individual problem list.  Marikay AlarEric Lasheika Ortloff, MD [facility] PGY-3

## 2014-09-07 NOTE — Assessment & Plan Note (Addendum)
Patient with swelling of both right sided limbs. Anatomically this type of unilateral UE and LE swelling does not make much sense. No signs of DVT in LE. Would be odd though possible to have developed DVT in 2 extremities, though no prior history would argue against this. She does have a history of cardiomyopathy with prior EF in 45-50% range with normal stress test. This prior EF brings in to question CHF as a cause. BNP was normal so likely not volume overloaded at this time, though will repeat echo. BMET with stable Cr. If continues to be an issue or swelling worsens, becomes painful, limbs become warm or erythematous, would consider work up for DVT at that time. Given return precautions.   On review of a prior problem of leg swelling, it appears that the patient has had this issue previously and had doppler of LE with no DVT in 2013.   Precepted with Dr Leveda AnnaHensel

## 2014-09-07 NOTE — Assessment & Plan Note (Signed)
Does not appear to be at goal at home. Will start on lisinopril especially in light of history of cardiomyopathy with prior reduced EF. BMET stable. Return in one week for repeat BMET.

## 2014-09-09 NOTE — Progress Notes (Signed)
I was the preceptor for this visit. 

## 2014-09-13 ENCOUNTER — Ambulatory Visit (HOSPITAL_COMMUNITY)
Admission: RE | Admit: 2014-09-13 | Discharge: 2014-09-13 | Disposition: A | Discharge status: Home / Self Care | Payer: Medicaid Other | Source: Ambulatory Visit | Attending: Family Medicine | Admitting: Family Medicine

## 2014-09-13 DIAGNOSIS — I081 Rheumatic disorders of both mitral and tricuspid valves: Secondary | ICD-10-CM | POA: Diagnosis not present

## 2014-09-13 DIAGNOSIS — I429 Cardiomyopathy, unspecified: Secondary | ICD-10-CM | POA: Diagnosis not present

## 2014-09-13 DIAGNOSIS — R600 Localized edema: Secondary | ICD-10-CM | POA: Diagnosis present

## 2014-09-13 NOTE — Progress Notes (Signed)
  Echocardiogram 2D Echocardiogram has been performed.  Arvil ChacoFoster, Jaquavian Firkus 09/13/2014, 10:29 AM

## 2014-09-18 ENCOUNTER — Telehealth: Payer: Self-pay | Admitting: Family Medicine

## 2014-09-18 NOTE — Telephone Encounter (Signed)
Spoke with patient regarding echo findings. Advised that no HF was seen. Advised of mild mitral regurgitation in mitral valve. Will monitor for development of heart murmur. Advised to follow-up if right sided swelling persists. She voiced understanding.

## 2014-09-30 ENCOUNTER — Encounter (HOSPITAL_BASED_OUTPATIENT_CLINIC_OR_DEPARTMENT_OTHER): Payer: Self-pay | Admitting: *Deleted

## 2014-09-30 ENCOUNTER — Emergency Department (HOSPITAL_BASED_OUTPATIENT_CLINIC_OR_DEPARTMENT_OTHER)
Admission: EM | Admit: 2014-09-30 | Discharge: 2014-09-30 | Disposition: A | Discharge status: Home / Self Care | Payer: Medicaid Other | Attending: Emergency Medicine | Admitting: Emergency Medicine

## 2014-09-30 DIAGNOSIS — Z8719 Personal history of other diseases of the digestive system: Secondary | ICD-10-CM | POA: Diagnosis not present

## 2014-09-30 DIAGNOSIS — G8929 Other chronic pain: Secondary | ICD-10-CM | POA: Insufficient documentation

## 2014-09-30 DIAGNOSIS — I10 Essential (primary) hypertension: Secondary | ICD-10-CM | POA: Diagnosis not present

## 2014-09-30 DIAGNOSIS — G43809 Other migraine, not intractable, without status migrainosus: Secondary | ICD-10-CM

## 2014-09-30 DIAGNOSIS — Z79899 Other long term (current) drug therapy: Secondary | ICD-10-CM | POA: Insufficient documentation

## 2014-09-30 DIAGNOSIS — Z87442 Personal history of urinary calculi: Secondary | ICD-10-CM | POA: Insufficient documentation

## 2014-09-30 DIAGNOSIS — G43909 Migraine, unspecified, not intractable, without status migrainosus: Secondary | ICD-10-CM | POA: Diagnosis present

## 2014-09-30 MED ORDER — DEXAMETHASONE SODIUM PHOSPHATE 10 MG/ML IJ SOLN
10.0000 mg | Freq: Once | INTRAMUSCULAR | Status: AC
  Administered 2014-09-30: 10 mg via INTRAVENOUS
  Filled 2014-09-30: qty 1

## 2014-09-30 MED ORDER — DIHYDROERGOTAMINE MESYLATE 1 MG/ML IJ SOLN
0.5000 mg | Freq: Once | INTRAMUSCULAR | Status: AC
  Administered 2014-09-30: 0.5 mg via INTRAVENOUS
  Filled 2014-09-30: qty 1

## 2014-09-30 MED ORDER — DIPHENHYDRAMINE HCL 50 MG/ML IJ SOLN
25.0000 mg | Freq: Once | INTRAMUSCULAR | Status: AC
  Administered 2014-09-30: 25 mg via INTRAVENOUS
  Filled 2014-09-30: qty 1

## 2014-09-30 MED ORDER — SODIUM CHLORIDE 0.9 % IV BOLUS (SEPSIS)
1000.0000 mL | Freq: Once | INTRAVENOUS | Status: AC
  Administered 2014-09-30: 1000 mL via INTRAVENOUS

## 2014-09-30 MED ORDER — HYDROMORPHONE HCL 1 MG/ML IJ SOLN
1.0000 mg | Freq: Once | INTRAMUSCULAR | Status: AC
  Administered 2014-09-30: 1 mg via INTRAVENOUS
  Filled 2014-09-30: qty 1

## 2014-09-30 MED ORDER — PROMETHAZINE HCL 25 MG/ML IJ SOLN
25.0000 mg | Freq: Once | INTRAMUSCULAR | Status: AC
  Administered 2014-09-30: 25 mg via INTRAVENOUS
  Filled 2014-09-30: qty 1

## 2014-09-30 MED ORDER — METOCLOPRAMIDE HCL 5 MG/ML IJ SOLN
10.0000 mg | Freq: Once | INTRAMUSCULAR | Status: AC
  Administered 2014-09-30: 10 mg via INTRAVENOUS
  Filled 2014-09-30: qty 2

## 2014-09-30 NOTE — Discharge Instructions (Signed)

## 2014-09-30 NOTE — ED Notes (Signed)
Migraine headache daily. Today pain is uncontrolled.

## 2014-09-30 NOTE — ED Provider Notes (Signed)
CSN: [id number]     Arrival date & time 09/30/14  1745 History   First MD Initiated Contact with Patient 09/30/14 1808     Chief Complaint  Patient presents with  . Migraine     (Consider location/radiation/quality/duration/timing/severity/associated sxs/prior Treatment) HPI Comments: Pt comes in with c/o headache that is chronic. Pt states that she has a headache everyday but today is worse then her baseline. Denies blurred vision, neck stiffness or fever. States that the headache is on the right side and down into her neck. Nausea without vomiting. Has taken fiorcicet, oxycodone, tompamax and zofran today. She is seen by pain management and neurology for these headaches. Not acute in onset and not the worst headache of her life  The history is provided by the patient. No language interpreter was used.    Past Medical History  Diagnosis Date  . Hypertension   . Chest pain   . Gall stones   . Migraine     "qd" (02/14/2013)  . Chronic lower back pain     "right side to mid back" (02/14/2013)  . Kidney stone ~ 2006   Past Surgical History  Procedure Laterality Date  . Laparoscopic cholecystectomy  ~ 2004  . Cystoscopy w/ stone manipulation  ~ 2006  . Intrauterine device insertion      "initially put in in 04/2002; changed prn" (02/14/2013)   Family History  Problem Relation Age of Onset  . Cancer Mother   . Cirrhosis Father    History  Substance Use Topics  . Smoking status: Never Smoker   . Smokeless tobacco: Never Used  . Alcohol Use: Yes     Comment: 02/14/2013 "socially; couple times/month"   OB History    No data available     Review of Systems  All other systems reviewed and are negative.     Allergies  Apple; Aspirin; and Carrot  Home Medications   Prior to Admission medications   Medication Sig Start Date End Date Taking? Authorizing Provider  Butalbital-APAP-Caffeine (FIORICET PO) Take by mouth.   Yes Historical Provider, MD  baclofen (LIORESAL) 10 MG  tablet Take 1 tablet (10 mg total) by mouth 3 (three) times daily as needed for muscle spasms. 12/24/13   Glori Luis, MD  cloNIDine (CATAPRES) 0.1 MG tablet TAKE 1 TABLET BY MOUTH TWICE DAILY 04/26/14   Glori Luis, MD  EPINEPHrine (EPIPEN 2-PAK) 0.3 mg/0.3 mL IJ SOAJ injection Inject 0.3 mLs (0.3 mg total) into the muscle once. 06/25/14   Glori Luis, MD  ergotamine-caffeine (CAFERGOT) 1-100 MG per tablet Two tablets at onset of attack; then 1 tablet every 30 minutes as needed; maximum: 6 tablets per attack; do not exceed 10 tablets/week Patient taking differently: Take 2 tablets by mouth every hour as needed for migraine. Two tablets at onset of attack; then 1 tablet every 30 minutes as needed; maximum: 6 tablets per attack; do not exceed 10 tablets/week 04/19/14   [name], MD  hydrOXYzine (ATARAX/VISTARIL) 25 MG tablet Take 1 tablet (25 mg total) by mouth 3 (three) times daily as needed. 06/25/14   Glori Luis, MD  lisinopril (PRINIVIL,ZESTRIL) 5 MG tablet Take 1 tablet (5 mg total) by mouth daily. 09/05/14   Glori Luis, MD  ondansetron (ZOFRAN ODT) 4 MG disintegrating tablet Take 1 tablet (4 mg total) by mouth every 6 (six) hours as needed for nausea. 07/22/13   Glori Luis, MD  oxyCODONE (OXY IR/ROXICODONE) 5 MG immediate release tablet  Take 5 mg by mouth. Every 12 hrs prn    Historical Provider, MD  promethazine (PHENERGAN) 25 MG tablet Take 1 tablet (25 mg total) by mouth every 6 (six) hours as needed for nausea or vomiting (migraine). 09/05/14   [name], MD  propranolol (INDERAL) 20 MG tablet Take 1 tablet (20 mg total) by mouth 2 (two) times daily. 06/13/14   [name], MD  sennosides-docusate sodium (SENOKOT-S) 8.6-50 MG tablet Take 1 tablet by mouth daily as needed for constipation.    Historical Provider, MD  topiramate (TOPAMAX) 100 MG tablet Take 150 mg by mouth 2 (two) times daily.    Historical Provider, MD   BP 156/88 mmHg  Pulse 71   Temp(Src) 99 F (37.2 C) (Oral)  Resp 18  Ht 5\' 5"  (1.651 m)  Wt 215 lb (97.523 kg)  BMI 35.78 kg/m2  SpO2 100% Physical Exam  Constitutional: She is oriented to person, place, and time. She appears well-developed and well-nourished.  HENT:  Head: Normocephalic.  Left Ear: External ear normal.  Eyes: Conjunctivae and EOM are normal. Pupils are equal, round, and reactive to light.  Neck: Normal range of motion. Neck supple.  Cardiovascular: Normal rate and regular rhythm.   Pulmonary/Chest: Effort normal and breath sounds normal.  Musculoskeletal: Normal range of motion.  Neurological: She is alert and oriented to person, place, and time.  Skin: Skin is warm and dry.  Psychiatric: She has a normal mood and affect.  Nursing note and vitals reviewed.   ED Course  Procedures (including critical care time) Labs Review Labs Reviewed - No data to display  Imaging Review No results found.   EKG Interpretation None      MDM   Final diagnoses:  Other migraine without status migrainosus, not intractable   Migraine consistent with previous headaches. Pt given multiple medications.pt tolerating po here. Pt feeling some better although not completely resolved. Pt instructed on follow up    Teressa LowerVrinda Leomia Blake, NP 09/30/14 2223  Vanetta MuldersScott Zackowski, MD 10/03/14 [phone]

## 2014-10-04 ENCOUNTER — Ambulatory Visit: Payer: Medicaid Other | Admitting: Family Medicine

## 2015-01-03 ENCOUNTER — Other Ambulatory Visit: Payer: Self-pay | Admitting: Neurology

## 2015-01-10 ENCOUNTER — Other Ambulatory Visit: Payer: Self-pay | Admitting: *Deleted

## 2015-01-13 MED ORDER — TOPIRAMATE 100 MG PO TABS: 150.0000 mg | ORAL_TABLET | Freq: Two times a day (BID) | ORAL | Status: DC

## 2015-01-15 ENCOUNTER — Ambulatory Visit (INDEPENDENT_AMBULATORY_CARE_PROVIDER_SITE_OTHER): Payer: Medicaid Other | Admitting: Family Medicine

## 2015-01-15 ENCOUNTER — Encounter: Payer: Self-pay | Admitting: Family Medicine

## 2015-01-15 VITALS — BP 177/89 | HR 65 | Temp 98.6°F | Wt 212.0 lb

## 2015-01-15 DIAGNOSIS — G43709 Chronic migraine without aura, not intractable, without status migrainosus: Secondary | ICD-10-CM | POA: Diagnosis present

## 2015-01-15 DIAGNOSIS — IMO0002 Reserved for concepts with insufficient information to code with codable children: Secondary | ICD-10-CM

## 2015-01-15 MED ORDER — PROPRANOLOL HCL 20 MG PO TABS: 20.0000 mg | ORAL_TABLET | Freq: Two times a day (BID) | ORAL | Status: DC

## 2015-01-15 MED ORDER — PROMETHAZINE HCL 25 MG RE SUPP: 25.0000 mg | Freq: Four times a day (QID) | RECTAL | Status: DC | PRN

## 2015-01-15 MED ORDER — BACLOFEN 10 MG PO TABS: 10.0000 mg | ORAL_TABLET | Freq: Three times a day (TID) | ORAL | Status: DC | PRN

## 2015-01-15 NOTE — Assessment & Plan Note (Signed)
Patient endorses worsening migraines over the last 7 days. Initially, numbness, tingling, and weakness noted on exam were concerning, however pt notes she's had this in the past and was worked up and everything was found to be normal on CT. That being said, increased ICP secondary to mass/bleed is much less likely. No fevers or chills concerning for infection.  Worsening may be secondary to medication compliance. Given the occipital region and tension noted on exam, this knot/tension in her R shoulder could be contributing to her pain quit a bit (and pt has not been taking baclofen). She has also stopped taking Topamax x 4days.  Unfortunately, she never followed up with neuro as recommended. - Refilled baclofen - Pt has refill of Topamax at pharmacy now - Per neuro notes, pt should be taking propanolol  BID (currently only on  BID). Pt's BP and HR could most likely tolerate an increase- will incr to  in AM and  in PM - Hydration discussed given heat - Urged pt to contact neurology for f/u appt - RTC precautions discussed: new symptoms, worsening or change in migraine, slurred speech, worsening weakness, dizziness, difficultly with walking. Pt voiced understanding. - F/u on Friday for me to assess change.   This was discussed with my attending, Dr. Lum Babe.

## 2015-01-15 NOTE — Progress Notes (Signed)
Patient ID: TINEKA URIEGAS, female   DOB: 02/22/1961, 54 y.o.   MRN: [id number]    Subjective: [id number] migraine HPI: Patient is a 54 y.o. female with a past medical history of chronic migraines with poor compliance presenting to [facility] clinic today for migraines .  Pt has had a migraine everyday x 12 years; she states she's never pain free per her repror. The pain is always on the R side, currently occipital in nature however has been frontal and parietal in the past. Throbbing pain. 10/10. Notes worsening aura as well Heat and direct sunlight makes it worse. No sonophobia. Notes hands and toes have been tingling. Initially states this is new over the last 7d, however then notes she had it in May 2015 when she went to an ED that was not in our system.  Notes weakness with nausea. Shaking due to pain.   Has taken Fioricet daily x 7 days. No improvement. Taking cafergot every day without improvement.  Taking clonidine, propanolol (only 20mg  BID), and Topamax. Its been 4 days since she took Topamax.  Not doing baclofen recently as she ran out. She feels tension in her shoulders, especially on the R. No h/o head trauma, nasal congestion, fevers, neck stiffness, problems with walking or speaking. Has never followed up with neuro which she was advised to due in March. Continues to see pain management.   Social History: never smoker.   ROS: All other systems reviewed and are negative.  Past Medical History Patient Active Problem List   Diagnosis Date Noted  . Edema of right arm and right leg 09/07/2014  . IUD contraception 07/18/2014  . History of anaphylaxis 06/26/2014  . Intractable migraine with status migrainosus 06/10/2014  . Intractable migraine without aura and with status migrainosus   . Bradycardia 02/18/2013  . Cardiomyopathy 10/20/2011  . Decreased ejection fraction 10/05/2011  . Leg swelling 09/23/2011  . Complex regional pain syndrome 05/03/2011  . Chronic pain 03/29/2011    . NECK PAIN, RIGHT 02/03/2010  . ALLERGIC RHINITIS 01/30/2009  . DIZZINESS 07/16/2008  . Chronic migraine 04/26/2007  . OBESITY, NOS 08/18/2006  . HYPERTENSION, BENIGN SYSTEMIC 08/18/2006    Medications- reviewed and updated Current Outpatient Prescriptions  Medication Sig Dispense Refill  . baclofen (LIORESAL) 10 MG tablet Take 1 tablet (10 mg total) by mouth 3 (three) times daily as needed for muscle spasms. 30 each 0  . Butalbital-APAP-Caffeine (FIORICET PO) Take by mouth.    . cloNIDine (CATAPRES) 0.1 MG tablet TAKE 1 TABLET BY MOUTH TWICE DAILY 60 tablet 1  . EPINEPHrine (EPIPEN 2-PAK) 0.3 mg/0.3 mL IJ SOAJ injection Inject 0.3 mLs (0.3 mg total) into the muscle once. 2 Device 0  . ergotamine-caffeine (CAFERGOT) 1-100 MG per tablet Two tablets at onset of attack; then 1 tablet every 30 minutes as needed; maximum: 6 tablets per attack; do not exceed 10 tablets/week (Patient taking differently: Take 2 tablets by mouth every hour as needed for migraine. Two tablets at onset of attack; then 1 tablet every 30 minutes as needed; maximum: 6 tablets per attack; do not exceed 10 tablets/week) 10 tablet 0  . hydrOXYzine (ATARAX/VISTARIL) 25 MG tablet Take 1 tablet (25 mg total) by mouth 3 (three) times daily as needed. 30 tablet 0  . lisinopril (PRINIVIL,ZESTRIL) 5 MG tablet Take 1 tablet (5 mg total) by mouth daily. 30 tablet 3  . ondansetron (ZOFRAN ODT) 4 MG disintegrating tablet Take 1 tablet (4 mg total) by mouth every 6 (six) hours  as needed for nausea. 30 tablet 1  . oxyCODONE (OXY IR/ROXICODONE) 5 MG immediate release tablet Take 5 mg by mouth. Every 12 hrs prn    . promethazine (PHENERGAN) 25 MG suppository Place 1 suppository (25 mg total) rectally every 6 (six) hours as needed for nausea or vomiting. 12 each 0  . propranolol (INDERAL) 20 MG tablet Take 1 tablet (20 mg total) by mouth 2 (two) times daily. Increase to 2 tablets (  in the evening) 180 tablet 1  . sennosides-docusate  sodium (SENOKOT-S) 8.6-50 MG tablet Take 1 tablet by mouth daily as needed for constipation.    . topiramate (TOPAMAX) 100 MG tablet Take 1.5 tablets (150 mg total) by mouth 2 (two) times daily. 90 tablet 2   No current facility-administered medications for this visit.    Objective: Office vital signs reviewed. BP 177/89 mmHg  Pulse 65  Temp(Src) 98.6 F (37 C) (Oral)  Wt 212 lb (96.163 kg)   Physical Examination:  General: sitting in a dark room on my entrance. In mild distress sometimes with her hands shaking due to pain.  ENMT:  TMs intact, normal light reflex, no erythema, no bulging. Nasal turbinates moist. MMM, Oropharynx clear without erythema or tonsillar exudate/hypertrophy Eyes: Conjunctiva non-injected. MSK: Knot and tension noted on the superior aspect of R shoulder. Pt begins to shake in pain, becomes tearful with palpation of this region. No nuchal rigidity. Normal gait and station Skin: dry, intact, no rashes or lesions Neuro: Speech clear. Smile symmetric, forehead movements symmetric. Unable to assess pupil response as pt constantly blinks due to the light. Decreased shoulder shrug on the R due to pain. 4/5 strength in the UE, 5/5 on the L. 4+/5 strength on the R LE, 5/5 on the L LE. Sensation intact to painful touch in the LE b/l. Does endorse some tingling. Neutral Babinski. Finger to nose intact.   Assessment/Plan: Chronic migraine Patient endorses worsening migraines over the last 7 days. Initially, numbness, tingling, and weakness noted on exam were concerning, however pt notes she's had this in the past and was worked up and everything was found to be normal on CT. That being said, increased ICP secondary to mass/bleed is much less likely. No fevers or chills concerning for infection.  Worsening may be secondary to medication compliance. Given the occipital region and tension noted on exam, this knot/tension in her R shoulder could be contributing to her pain quit a bit  (and pt has not been taking baclofen). She has also stopped taking Topamax x 4days.  Unfortunately, she never followed up with neuro as recommended. - Refilled baclofen - Pt has refill of Topamax at pharmacy now - Per neuro notes, pt should be taking propanolol  BID (currently only on  BID). Pt's BP and HR could most likely tolerate an increase- will incr to  in AM and  in PM - Hydration discussed given heat - Urged pt to contact neurology for f/u appt - RTC precautions discussed: new symptoms, worsening or change in migraine, slurred speech, worsening weakness, dizziness, difficultly with walking. Pt voiced understanding. - F/u on Friday for me to assess change.   This was discussed with my attending, Dr. Lum Babe.     No orders of the defined types were placed in this encounter.    Meds ordered this encounter  Medications  . baclofen (LIORESAL) 10 MG tablet    Sig: Take 1 tablet (10 mg total) by mouth 3 (three) times daily as needed for muscle  spasms.    Dispense:  30 each    Refill:  0  . promethazine (PHENERGAN) 25 MG suppository    Sig: Place 1 suppository (25 mg total) rectally every 6 (six) hours as needed for nausea or vomiting.    Dispense:  12 each    Refill:  0  . propranolol (INDERAL) 20 MG tablet    Sig: Take 1 tablet (20 mg total) by mouth 2 (two) times daily. Increase to 2 tablets (40mg  in the evening)    Dispense:  180 tablet    Refill:  1    Joanna Puff PGY-2, [facility]]

## 2015-01-15 NOTE — Patient Instructions (Addendum)
Increase propanolol to  in the morning and  at night  I have prescribed baclofen for all you muscle tension on the right side I have prescribed phenergan suppositories to help with the nausea If you notice worsening headaches, new symptoms that you have never had, slurred speech, difficulty with speaking please seek medication attention immediately.   Please call and make an appointment with neurology. Follow up with [place] on Friday  Migraine Headache A migraine headache is an intense, throbbing pain on one or both sides of your head. A migraine can last for 30 minutes to several hours. CAUSES  The exact cause of a migraine headache is not always known. However, a migraine may be caused when nerves in the brain become irritated and release chemicals that cause inflammation. This causes pain. Certain things may also trigger migraines, such as:  Alcohol.  Smoking.  Stress.  Menstruation.  Aged cheeses.  Foods or drinks that contain nitrates, glutamate, aspartame, or tyramine.  Lack of sleep.  Chocolate.  Caffeine.  Hunger.  Physical exertion.  Fatigue.  Medicines used to treat chest pain (nitroglycerine), birth control pills, estrogen, and some blood pressure medicines. SIGNS AND SYMPTOMS  Pain on one or both sides of your head.  Pulsating or throbbing pain.  Severe pain that prevents daily activities.  Pain that is aggravated by any physical activity.  Nausea, vomiting, or both.  Dizziness.  Pain with exposure to bright lights, loud noises, or activity.  General sensitivity to bright lights, loud noises, or smells. Before you get a migraine, you may get warning signs that a migraine is coming (aura). An aura may include:  Seeing flashing lights.  Seeing bright spots, halos, or zigzag lines.  Having tunnel vision or blurred vision.  Having feelings of numbness or tingling.  Having trouble talking.  Having muscle weakness. DIAGNOSIS  A migraine  headache is often diagnosed based on:  Symptoms.  Physical exam.  A CT scan or MRI of your head. These imaging tests cannot diagnose migraines, but they can help rule out other causes of headaches. TREATMENT Medicines may be given for pain and nausea. Medicines can also be given to help prevent recurrent migraines.  HOME CARE INSTRUCTIONS  Only take over-the-counter or prescription medicines for pain or discomfort as directed by your health care provider. The use of long-term narcotics is not recommended.  Lie down in a dark, quiet room when you have a migraine.  Keep a journal to find out what may trigger your migraine headaches. For example, write down:  What you eat and drink.  How much sleep you get.  Any change to your diet or medicines.  Limit alcohol consumption.  Quit smoking if you smoke.  Get 7-9 hours of sleep, or as recommended by your health care provider.  Limit stress.  Keep lights dim if bright lights bother you and make your migraines worse. SEEK IMMEDIATE MEDICAL CARE IF:   Your migraine becomes severe.  You have a fever.  You have a stiff neck.  You have vision loss.  You have muscular weakness or loss of muscle control.  You start losing your balance or have trouble walking.  You feel faint or pass out.  You have severe symptoms that are different from your first symptoms. MAKE SURE YOU:   Understand these instructions.  Will watch your condition.  Will get help right away if you are not doing well or get worse. Document Released: 06/07/2005 Document Revised: 10/22/2013 Document Reviewed: 02/12/2013 Exit[employer]  Patient Information 2015 [employer]. This information is not intended to replace advice given to you by your health care provider. Make sure you discuss any questions you have with your health care provider.  

## 2015-01-17 ENCOUNTER — Ambulatory Visit (INDEPENDENT_AMBULATORY_CARE_PROVIDER_SITE_OTHER): Payer: Medicaid Other | Admitting: Family Medicine

## 2015-01-17 ENCOUNTER — Encounter: Payer: Self-pay | Admitting: Family Medicine

## 2015-01-17 VITALS — BP 160/86 | HR 58 | Temp 98.6°F | Wt 212.0 lb

## 2015-01-17 DIAGNOSIS — G43111 Migraine with aura, intractable, with status migrainosus: Secondary | ICD-10-CM

## 2015-01-17 MED ORDER — DEXAMETHASONE SODIUM PHOSPHATE 10 MG/ML IJ SOLN
10.0000 mg | Freq: Once | INTRAMUSCULAR | Status: AC
  Administered 2015-01-17: 10 mg via INTRAMUSCULAR

## 2015-01-17 MED ORDER — KETOROLAC TROMETHAMINE 60 MG/2ML IM SOLN
60.0000 mg | Freq: Once | INTRAMUSCULAR | Status: AC
  Administered 2015-01-17: 60 mg via INTRAMUSCULAR

## 2015-01-17 MED ORDER — PROMETHAZINE HCL 25 MG/ML IJ SOLN
25.0000 mg | Freq: Once | INTRAMUSCULAR | Status: AC
  Administered 2015-01-17: 25 mg via INTRAMUSCULAR

## 2015-01-17 NOTE — Assessment & Plan Note (Addendum)
Migraines/nuero exam appear stable today from previous. Per patient, most bothersome thing is N/V as this worsens pain. No new symptoms concerning for mass/bleed/increased ICP. Patient has a significant allergy to ASA but states she's taken Aleve and toradol in the past without problems. She did not drive today.  - Decadron  IM, phenergan  IM, and Toradol  IM in clinic - Discussed using Benadryl and phenergan supp at home for assistance with abortive therapy. - Patient urged to call neurology TODAY. - Discussed following up on Monday.  - RTC precautions discussed: worsening migraine, change in character, new symptoms, change in vision, slurred speech, weakness, dizziness, imbalance. Pt voiced understanding.

## 2015-01-17 NOTE — Patient Instructions (Signed)
You received dexamethasone (steroid), Toradol (NSAID for pain), and Phenergan while you were here. You will be drowsy, please do not drive.  Start taking Benadryl at bedtime with phenergan suppository, sometimes this combination can help with the migraine. You can continue to use the Fioricet or Cafergot. Please return if you note worsening migraines, new weakness, facial droop, problems walking, slurred speech, or change in migraine. Please call neurology today to make a follow up. Follow up with [place] on Monday if you are not doing any better.

## 2015-01-17 NOTE — Progress Notes (Signed)
Patient ID: Kirsten Walsh, female   DOB: August 17, 1960, 54 y.o.   MRN: [id number]    Subjective: CC:f/u migraine  HPI: Patient is a 54 y.o. female with a past medical history of chronic migraines presenting to clinic today for f/u worsening migraine x 9 days.  Migraine is stable even with addition of  Baclofen and increase in propanolol from 2 days ago. She continues to have N/V even with phenergan suppository (has tried this 4x, normally about 30 minutes prior to eating). Still feels like she has an earache and pain in the R side of the neck. Pain is worse when she vomits so she worries about eating. Denies true weakness (still taking care of 54 y/o). The tingling in fingers is still present, the tingling in her toes has decreased but is still present (most notable when she's vomiting)> all these things have been present with her migraines before. When she vomits she sees the "silver specks" more often. Notes she always has these silver specks when she has intense pain. No slurred speech or problems with vision other than above mentioned specks.   She still hasn't called neurology.  Social History: non-smoker   ROS: All other systems reviewed and are negative.  Past Medical History Patient Active Problem List   Diagnosis Date Noted  . Edema of right arm and right leg 09/07/2014  . IUD contraception 07/18/2014  . History of anaphylaxis 06/26/2014  . Intractable migraine with status migrainosus 06/10/2014  . Intractable migraine without aura and with status migrainosus   . Bradycardia 02/18/2013  . Cardiomyopathy 10/20/2011  . Decreased ejection fraction 10/05/2011  . Leg swelling 09/23/2011  . Complex regional pain syndrome 05/03/2011  . Chronic pain 03/29/2011  . NECK PAIN, RIGHT 02/03/2010  . ALLERGIC RHINITIS 01/30/2009  . DIZZINESS 07/16/2008  . Chronic migraine 04/26/2007  . OBESITY, NOS 08/18/2006  . HYPERTENSION, BENIGN SYSTEMIC 08/18/2006    Medications- reviewed and  updated Current Outpatient Prescriptions  Medication Sig Dispense Refill  . baclofen (LIORESAL) 10 MG tablet Take 1 tablet (10 mg total) by mouth 3 (three) times daily as needed for muscle spasms. 30 each 0  . Butalbital-APAP-Caffeine (FIORICET PO) Take by mouth.    . cloNIDine (CATAPRES) 0.1 MG tablet TAKE 1 TABLET BY MOUTH TWICE DAILY 60 tablet 1  . EPINEPHrine (EPIPEN 2-PAK) 0.3 mg/0.3 mL IJ SOAJ injection Inject 0.3 mLs (0.3 mg total) into the muscle once. 2 Device 0  . ergotamine-caffeine (CAFERGOT) 1-100 MG per tablet Two tablets at onset of attack; then 1 tablet every 30 minutes as needed; maximum: 6 tablets per attack; do not exceed 10 tablets/week (Patient taking differently: Take 2 tablets by mouth every hour as needed for migraine. Two tablets at onset of attack; then 1 tablet every 30 minutes as needed; maximum: 6 tablets per attack; do not exceed 10 tablets/week) 10 tablet 0  . hydrOXYzine (ATARAX/VISTARIL) 25 MG tablet Take 1 tablet (25 mg total) by mouth 3 (three) times daily as needed. 30 tablet 0  . lisinopril (PRINIVIL,ZESTRIL) 5 MG tablet Take 1 tablet (5 mg total) by mouth daily. 30 tablet 3  . ondansetron (ZOFRAN ODT) 4 MG disintegrating tablet Take 1 tablet (4 mg total) by mouth every 6 (six) hours as needed for nausea. 30 tablet 1  . oxyCODONE (OXY IR/ROXICODONE) 5 MG immediate release tablet Take 5 mg by mouth. Every 12 hrs prn    . promethazine (PHENERGAN) 25 MG suppository Place 1 suppository (25 mg total) rectally every  6 (six) hours as needed for nausea or vomiting. 12 each 0  . propranolol (INDERAL) 20 MG tablet Take 1 tablet (20 mg total) by mouth 2 (two) times daily. Increase to 2 tablets (  in the evening) 180 tablet 1  . sennosides-docusate sodium (SENOKOT-S) 8.6-50 MG tablet Take 1 tablet by mouth daily as needed for constipation.    . topiramate (TOPAMAX) 100 MG tablet Take 1.5 tablets (150 mg total) by mouth 2 (two) times daily. 90 tablet 2   No current  facility-administered medications for this visit.    Objective: Office vital signs reviewed. BP 160/86 mmHg  Pulse 58  Temp(Src) 98.6 F (37 C) (Oral)  Wt 212 lb (96.163 kg)   Physical Examination:  General: Awake, alert, well- nourished, in moderate distress sometimes dry heaving.  ENMT:  TMs intact, normal light reflex, no erythema, no bulging. Nasal turbinates moist. MMM, Oropharynx clear without erythema or tonsillar exudate/hypertrophy Eyes: Conjunctiva non-injected.  MSK: Knot and tension still noted on super aspect of R shoulder. Normal bulk and tone. Normal gait and station Neuro: Speech clear without slurring. Smile symmetric, forehead movements symmetric. Tongue/uvula midline. Unable to assess pupil response as pt constantly blinks with light. EOMI without nystagmus.  Decreased shoulder shrug on the R due to pain. 4/5 strength in the R UE, 5/5 on the L. 4/5 strength on the R LE (per report has pain that radiates from back of calf to head causing weakness), 4+/5 on the L LE. Sensation intact to painful touch in the LE b/l. Neutral Babinski. Finger to nose intact. Some difficulty with heel/shin as she has pain with movement of hips.  Assessment/Plan: Intractable migraine with status migrainosus Migraines/nuero exam appear stable today from previous. Per patient, most bothersome thing is N/V as this worsens pain. No new symptoms concerning for mass/bleed/increased ICP. Patient has a significant allergy to ASA but states she's taken Aleve and toradol in the past without problems. She did not drive today.  - Decadron  IM, phenergan  IM, and Toradol  IM in clinic - Discussed using Benadryl and phenergan supp at home for assistance with abortive therapy. - Patient urged to call neurology TODAY. - Discussed following up on Monday.  - RTC precautions discussed: worsening migraine, change in character, new symptoms, change in vision, slurred speech, weakness, dizziness,  imbalance. Pt voiced understanding.     No orders of the defined types were placed in this encounter.    Meds ordered this encounter  Medications  . ketorolac (TORADOL) injection 60 mg    Sig:   . promethazine (PHENERGAN) injection 25 mg    Sig:   . dexamethasone (DECADRON) injection 10 mg    Sig:     Joanna Puff PGY-2, [facility]

## 2015-01-22 ENCOUNTER — Ambulatory Visit (INDEPENDENT_AMBULATORY_CARE_PROVIDER_SITE_OTHER): Payer: Medicaid Other | Admitting: *Deleted

## 2015-01-22 DIAGNOSIS — Z111 Encounter for screening for respiratory tuberculosis: Secondary | ICD-10-CM

## 2015-01-22 NOTE — Progress Notes (Signed)
   PPD placed Left Forearm.  Pt to return 01/24/15 for reading.  Pt tolerated intradermal injection. Clovis Pu, RN

## 2015-01-24 ENCOUNTER — Encounter: Payer: Self-pay | Admitting: *Deleted

## 2015-01-24 ENCOUNTER — Ambulatory Visit (INDEPENDENT_AMBULATORY_CARE_PROVIDER_SITE_OTHER): Payer: Medicaid Other | Admitting: *Deleted

## 2015-01-24 DIAGNOSIS — Z7689 Persons encountering health services in other specified circumstances: Secondary | ICD-10-CM

## 2015-01-24 DIAGNOSIS — Z111 Encounter for screening for respiratory tuberculosis: Secondary | ICD-10-CM

## 2015-01-24 LAB — TB SKIN TEST
Induration: 0 mm
TB Skin Test: NEGATIVE

## 2015-01-24 NOTE — Progress Notes (Signed)
   PPD Reading Note PPD read and results entered in EpicCare. Result: 0 mm induration. Interpretation: Negative If test not read within 48-72 hours of initial placement, patient advised to repeat in other arm 1-3 weeks after this test. Allergic reaction: no  Laderrick Wilk L, RN  

## 2015-01-27 ENCOUNTER — Emergency Department (HOSPITAL_COMMUNITY)
Admission: EM | Admit: 2015-01-27 | Discharge: 2015-01-28 | Disposition: A | Discharge status: Home / Self Care | Payer: Medicaid Other | Attending: Emergency Medicine | Admitting: Emergency Medicine

## 2015-01-27 ENCOUNTER — Other Ambulatory Visit: Payer: Self-pay

## 2015-01-27 ENCOUNTER — Encounter (HOSPITAL_COMMUNITY): Payer: Self-pay | Admitting: *Deleted

## 2015-01-27 DIAGNOSIS — I10 Essential (primary) hypertension: Secondary | ICD-10-CM | POA: Insufficient documentation

## 2015-01-27 DIAGNOSIS — G43909 Migraine, unspecified, not intractable, without status migrainosus: Secondary | ICD-10-CM | POA: Insufficient documentation

## 2015-01-27 DIAGNOSIS — Z87442 Personal history of urinary calculi: Secondary | ICD-10-CM | POA: Diagnosis not present

## 2015-01-27 DIAGNOSIS — Z79899 Other long term (current) drug therapy: Secondary | ICD-10-CM | POA: Diagnosis not present

## 2015-01-27 DIAGNOSIS — Z8719 Personal history of other diseases of the digestive system: Secondary | ICD-10-CM | POA: Diagnosis not present

## 2015-01-27 DIAGNOSIS — G8929 Other chronic pain: Secondary | ICD-10-CM | POA: Diagnosis not present

## 2015-01-27 DIAGNOSIS — R51 Headache: Secondary | ICD-10-CM | POA: Diagnosis present

## 2015-01-27 LAB — CBC
HCT: 43.4 % (ref 36.0–46.0)
HEMOGLOBIN: 14.4 g/dL (ref 12.0–15.0)
MCH: 30.8 pg (ref 26.0–34.0)
MCHC: 33.2 g/dL (ref 30.0–36.0)
MCV: 92.9 fL (ref 78.0–100.0)
Platelets: 236 10*3/uL (ref 150–400)
RBC: 4.67 MIL/uL (ref 3.87–5.11)
RDW: 14 % (ref 11.5–15.5)
WBC: 6.6 10*3/uL (ref 4.0–10.5)

## 2015-01-27 MED ORDER — ONDANSETRON HCL 4 MG/2ML IJ SOLN
4.0000 mg | Freq: Once | INTRAMUSCULAR | Status: AC
  Administered 2015-01-27: 4 mg via INTRAVENOUS
  Filled 2015-01-27: qty 2

## 2015-01-27 MED ORDER — METOCLOPRAMIDE HCL 5 MG/ML IJ SOLN
10.0000 mg | Freq: Once | INTRAMUSCULAR | Status: AC
  Administered 2015-01-27: 10 mg via INTRAVENOUS
  Filled 2015-01-27: qty 2

## 2015-01-27 MED ORDER — DIPHENHYDRAMINE HCL 50 MG/ML IJ SOLN
25.0000 mg | Freq: Once | INTRAMUSCULAR | Status: AC
  Administered 2015-01-27: 25 mg via INTRAVENOUS
  Filled 2015-01-27: qty 1

## 2015-01-27 NOTE — ED Notes (Signed)
Pt arrives to the ER via EMS for complaints of a migraine and a witnessed syncopal episode; pt states that she has a history of migraines and has had these episodes in the past; pt c/o nausea and photosensitivity; family reported to EMS that pt "passed out and was out for a few seconds"; no injury

## 2015-01-28 LAB — URINALYSIS, ROUTINE W REFLEX MICROSCOPIC
Bilirubin Urine: NEGATIVE
Glucose, UA: NEGATIVE mg/dL
Hgb urine dipstick: NEGATIVE
KETONES UR: NEGATIVE mg/dL
LEUKOCYTES UA: NEGATIVE
Nitrite: NEGATIVE
PROTEIN: NEGATIVE mg/dL
SPECIFIC GRAVITY, URINE: 1.006 (ref 1.005–1.030)
Urobilinogen, UA: 0.2 mg/dL (ref 0.0–1.0)
pH: 6 (ref 5.0–8.0)

## 2015-01-28 LAB — CBG MONITORING, ED: GLUCOSE-CAPILLARY: 104 mg/dL — AB (ref 65–99)

## 2015-01-28 LAB — BASIC METABOLIC PANEL
Anion gap: 7 (ref 5–15)
BUN: 11 mg/dL (ref 6–20)
CALCIUM: 8.6 mg/dL — AB (ref 8.9–10.3)
CHLORIDE: 116 mmol/L — AB (ref 101–111)
CO2: 16 mmol/L — AB (ref 22–32)
CREATININE: 0.97 mg/dL (ref 0.44–1.00)
GFR calc Af Amer: >60 mL/min (ref >60)
GFR calc non Af Amer: >60 mL/min (ref >60)
GLUCOSE: 103 mg/dL — AB (ref 65–99)
Potassium: 4 mmol/L (ref 3.5–5.1)
Sodium: 139 mmol/L (ref 135–145)

## 2015-01-28 MED ORDER — KETOROLAC TROMETHAMINE 30 MG/ML IJ SOLN
30.0000 mg | Freq: Once | INTRAMUSCULAR | Status: AC
  Administered 2015-01-28: 30 mg via INTRAVENOUS
  Filled 2015-01-28: qty 1

## 2015-01-28 MED ORDER — SODIUM CHLORIDE 0.9 % IV BOLUS (SEPSIS)
1000.0000 mL | Freq: Once | INTRAVENOUS | Status: AC
  Administered 2015-01-28: 1000 mL via INTRAVENOUS

## 2015-01-28 MED ORDER — PROCHLORPERAZINE EDISYLATE 5 MG/ML IJ SOLN
10.0000 mg | Freq: Once | INTRAMUSCULAR | Status: AC
  Administered 2015-01-28: 10 mg via INTRAVENOUS
  Filled 2015-01-28: qty 2

## 2015-01-28 MED ORDER — FENTANYL CITRATE (PF) 100 MCG/2ML IJ SOLN
100.0000 ug | Freq: Once | INTRAMUSCULAR | Status: AC
  Administered 2015-01-28: 100 ug via INTRAVENOUS
  Filled 2015-01-28: qty 2

## 2015-01-28 MED ORDER — DEXAMETHASONE SODIUM PHOSPHATE 10 MG/ML IJ SOLN
10.0000 mg | Freq: Once | INTRAMUSCULAR | Status: AC
  Administered 2015-01-28: 10 mg via INTRAVENOUS
  Filled 2015-01-28: qty 1

## 2015-01-28 NOTE — ED Notes (Signed)
Light green needs to be recollected, main lab called and made aware of pt being a difficult stick. Will come to collect lab.

## 2015-01-28 NOTE — ED Provider Notes (Signed)
CSN: [id number]     Arrival date & time 01/27/15  2242 History   First MD Initiated Contact with Patient 01/28/15 0153     Chief Complaint  Patient presents with  . Migraine     (Consider location/radiation/quality/duration/timing/severity/associated sxs/prior Treatment) HPI Patient presents to the emergency department with migraine headache.  This been ongoing for the past 2 weeks.  Patient states that she has had migraine headache over the last few weeks.  She has a history of migraines she is planning of nausea and photosensitivity.  She states that she may have passed out for a few seconds, but it was very brief.  The patient states that nothing seems to make her condition better.  She was seen by her primary care doctors who she states did not do anything for her headache.  She has very frequent migraines and is on Topamax.  The patient denies blurred vision, weakness, dizziness, headache, blurred vision, back pain, neck pain, fever, chest pain, shortness of breath or lightheadedness.  Patient states that she has had nausea with some vomiting also Past Medical History  Diagnosis Date  . Hypertension   . Chest pain   . Gall stones   . Migraine     "qd" (02/14/2013)  . Chronic lower back pain     "right side to mid back" (02/14/2013)  . Kidney stone ~ 2006   Past Surgical History  Procedure Laterality Date  . Laparoscopic cholecystectomy  ~ 2004  . Cystoscopy w/ stone manipulation  ~ 2006  . Intrauterine device insertion      "initially put in in 04/2002; changed prn" (02/14/2013)   Family History  Problem Relation Age of Onset  . Cancer Mother   . Cirrhosis Father    History  Substance Use Topics  . Smoking status: Never Smoker   . Smokeless tobacco: Never Used  . Alcohol Use: Yes     Comment: 02/14/2013 "socially; couple times/month"   OB History    No data available     Review of Systems  All other systems negative except as documented in the HPI. All pertinent  positives and negatives as reviewed in the HPI.  Allergies  Apple; Aspirin; and Carrot  Home Medications   Prior to Admission medications   Medication Sig Start Date End Date Taking? Authorizing Provider  baclofen (LIORESAL) 10 MG tablet Take 1 tablet (10 mg total) by mouth 3 (three) times daily as needed for muscle spasms. 01/15/15  Yes Joanna Puff, MD  butalbital-acetaminophen-caffeine (FIORICET, ESGIC) 50-325-40 MG per tablet Take 1 tablet by mouth every 6 (six) hours as needed. For migraine 11/25/14  Yes Historical Provider, MD  cloNIDine (CATAPRES) 0.1 MG tablet TAKE 1 TABLET BY MOUTH TWICE DAILY 04/26/14  Yes Glori Luis, MD  EPINEPHrine (EPIPEN 2-PAK) 0.3 mg/0.3 mL IJ SOAJ injection Inject 0.3 mLs (0.3 mg total) into the muscle once. 06/25/14  Yes Glori Luis, MD  ergotamine-caffeine (CAFERGOT) 1-100 MG per tablet Two tablets at onset of attack; then 1 tablet every 30 minutes as needed; maximum: 6 tablets per attack; do not exceed 10 tablets/week Patient taking differently: Take 2 tablets by mouth every hour as needed for migraine. Two tablets at onset of attack; then 1 tablet every 30 minutes as needed; maximum: 6 tablets per attack; do not exceed 10 tablets/week 04/19/14  Yes [name], MD  hydrOXYzine (ATARAX/VISTARIL) 25 MG tablet Take 1 tablet (25 mg total) by mouth 3 (three) times daily as needed. Patient  taking differently: Take 25 mg by mouth 3 (three) times daily as needed for anxiety.  06/25/14  Yes Glori Luis, MD  lisinopril (PRINIVIL,ZESTRIL) 5 MG tablet Take 1 tablet (5 mg total) by mouth daily. 09/05/14  Yes Glori Luis, MD  ondansetron (ZOFRAN) 4 MG tablet Take 4 mg by mouth every 8 (eight) hours as needed. For nausea/vomiting. 10/20/14  Yes Historical Provider, MD  oxyCODONE (OXY IR/ROXICODONE) 5 MG immediate release tablet Take 5 mg by mouth every 12 (twelve) hours as needed. Every 12 hrs prn   Yes Historical Provider, MD  promethazine (PHENERGAN) 25 MG  suppository Place 1 suppository (25 mg total) rectally every 6 (six) hours as needed for nausea or vomiting. 01/15/15  Yes Joanna Puff, MD  propranolol (INDERAL) 20 MG tablet Take 1 tablet (20 mg total) by mouth 2 (two) times daily. Increase to 2 tablets (  in the evening) 01/15/15  Yes Joanna Puff, MD  sennosides-docusate sodium (SENOKOT-S) 8.6-50 MG tablet Take 1 tablet by mouth daily as needed for constipation.   Yes Historical Provider, MD  topiramate (TOPAMAX) 100 MG tablet Take 1.5 tablets (150 mg total) by mouth 2 (two) times daily. 01/13/15  Yes Ardith Dark, MD   BP 119/76 mmHg  Pulse 86  Temp(Src) 98.6 F (37 C) (Oral)  Resp 85  SpO2 100% Physical Exam  Constitutional: She is oriented to person, place, and time. She appears well-developed and well-nourished. No distress.  HENT:  Head: Normocephalic and atraumatic.  Mouth/Throat: Oropharynx is clear and moist.  Eyes: EOM are normal. Pupils are equal, round, and reactive to light.  Neck: Normal range of motion. Neck supple.  Cardiovascular: Normal rate, regular rhythm and normal heart sounds.  Exam reveals no gallop and no friction rub.   No murmur heard. Pulmonary/Chest: Effort normal and breath sounds normal. No respiratory distress.  Musculoskeletal: She exhibits no edema.  Neurological: She is alert and oriented to person, place, and time. She exhibits normal muscle tone. Coordination normal.  Skin: Skin is warm and dry. No rash noted. No erythema.  Psychiatric: She has a normal mood and affect. Her behavior is normal.  Nursing note and vitals reviewed.   ED Course  Procedures (including critical care time) Labs Review Labs Reviewed  URINALYSIS, ROUTINE W REFLEX MICROSCOPIC (NOT AT [facility]) - Abnormal; Notable for the following:    APPearance CLOUDY (*)    All other components within normal limits  BASIC METABOLIC PANEL - Abnormal; Notable for the following:    Chloride 116 (*)    CO2 16 (*)    Glucose, Bld  103 (*)    Calcium 8.6 (*)    All other components within normal limits  CBG MONITORING, ED - Abnormal; Notable for the following:    Glucose-Capillary 104 (*)    All other components within normal limits  CBC  HCG, QUANTITATIVE, PREGNANCY   5:25 Followed up with patient regarding symptoms.  She states her headache and nausea have improved.  Complains of shoulder tightness which was present PTA.  She has been taking a muscle relaxer prescribed by her PCP.       Charlestine Night, PA-C 01/28/15 0532  Paula Libra, MD 01/28/15 [phone]

## 2015-01-28 NOTE — Discharge Instructions (Signed)
Return here as needed.  Followup with your primary care Dr. increase your fluid intake °

## 2015-01-28 NOTE — ED Notes (Signed)
PA at bedside.

## 2015-02-01 ENCOUNTER — Other Ambulatory Visit: Payer: Self-pay | Admitting: Family Medicine

## 2015-02-03 NOTE — Telephone Encounter (Signed)
Please advise as Dr. Birdie Sons is in a different office now.  Thanks Kenney Houseman, RN

## 2015-03-02 ENCOUNTER — Encounter (HOSPITAL_BASED_OUTPATIENT_CLINIC_OR_DEPARTMENT_OTHER): Payer: Self-pay

## 2015-03-02 ENCOUNTER — Emergency Department (HOSPITAL_BASED_OUTPATIENT_CLINIC_OR_DEPARTMENT_OTHER)
Admission: EM | Admit: 2015-03-02 | Discharge: 2015-03-03 | Disposition: A | Discharge status: Home / Self Care | Payer: Medicaid Other | Attending: Emergency Medicine | Admitting: Emergency Medicine

## 2015-03-02 DIAGNOSIS — I10 Essential (primary) hypertension: Secondary | ICD-10-CM | POA: Insufficient documentation

## 2015-03-02 DIAGNOSIS — Z87442 Personal history of urinary calculi: Secondary | ICD-10-CM | POA: Insufficient documentation

## 2015-03-02 DIAGNOSIS — M542 Cervicalgia: Secondary | ICD-10-CM | POA: Insufficient documentation

## 2015-03-02 DIAGNOSIS — G8929 Other chronic pain: Secondary | ICD-10-CM | POA: Diagnosis not present

## 2015-03-02 DIAGNOSIS — G43011 Migraine without aura, intractable, with status migrainosus: Secondary | ICD-10-CM | POA: Diagnosis not present

## 2015-03-02 DIAGNOSIS — Z8719 Personal history of other diseases of the digestive system: Secondary | ICD-10-CM | POA: Diagnosis not present

## 2015-03-02 DIAGNOSIS — R2243 Localized swelling, mass and lump, lower limb, bilateral: Secondary | ICD-10-CM | POA: Diagnosis not present

## 2015-03-02 DIAGNOSIS — G43909 Migraine, unspecified, not intractable, without status migrainosus: Secondary | ICD-10-CM | POA: Diagnosis present

## 2015-03-02 DIAGNOSIS — M545 Low back pain: Secondary | ICD-10-CM | POA: Diagnosis not present

## 2015-03-02 DIAGNOSIS — Z79899 Other long term (current) drug therapy: Secondary | ICD-10-CM | POA: Diagnosis not present

## 2015-03-02 MED ORDER — SODIUM CHLORIDE 0.9 % IV BOLUS (SEPSIS)
1000.0000 mL | Freq: Once | INTRAVENOUS | Status: AC
  Administered 2015-03-02: 1000 mL via INTRAVENOUS

## 2015-03-02 MED ORDER — PROMETHAZINE HCL 25 MG/ML IJ SOLN
12.5000 mg | Freq: Once | INTRAMUSCULAR | Status: AC
  Administered 2015-03-02: 12.5 mg via INTRAVENOUS
  Filled 2015-03-02: qty 1

## 2015-03-02 MED ORDER — DIPHENHYDRAMINE HCL 50 MG/ML IJ SOLN
25.0000 mg | Freq: Once | INTRAMUSCULAR | Status: AC
  Administered 2015-03-02: 25 mg via INTRAVENOUS
  Filled 2015-03-02: qty 1

## 2015-03-02 MED ORDER — DEXAMETHASONE SODIUM PHOSPHATE 10 MG/ML IJ SOLN
10.0000 mg | Freq: Once | INTRAMUSCULAR | Status: AC
  Administered 2015-03-02: 10 mg via INTRAVENOUS
  Filled 2015-03-02: qty 1

## 2015-03-02 MED ORDER — SODIUM CHLORIDE 0.9 % IV SOLN
INTRAVENOUS | Status: DC
  Administered 2015-03-02: 22:00:00 via INTRAVENOUS

## 2015-03-02 NOTE — ED Provider Notes (Signed)
CSN: [id number]     Arrival date & time 03/02/15  2024 History  This chart was scribed for Vanetta Mulders, MD by Budd Palmer, ED Scribe. This patient was seen in room MH04/MH04 and the patient's care was started at 9:41 PM.     Chief Complaint  Patient presents with  . Migraine   The history is provided by the patient. No language interpreter was used.   HPI Comments: Kirsten Walsh is a 54 y.o. female with a PMHx of HTN and migraines who presents to the Emergency Department complaining of worsening, right-sided migraine onset 3-4 days ago. She notes she has migraines daily, usually one-sided, but states that this has now been progressively worsening. She reports associated visual disturbance in the right eye, nausea, and photophobia. She is on medication for migraines and has a neurologist at [facility] she sees on a regular basis for this. She states that various migraine cocktails have varying degrees of effectiveness in resolving the pain. Pt denies vomiting.   Past Medical History  Diagnosis Date  . Hypertension   . Chest pain   . Gall stones   . Migraine     "qd" (02/14/2013)  . Chronic lower back pain     "right side to mid back" (02/14/2013)  . Kidney stone ~ 2006   Past Surgical History  Procedure Laterality Date  . Laparoscopic cholecystectomy  ~ 2004  . Cystoscopy w/ stone manipulation  ~ 2006  . Intrauterine device insertion      "initially put in in 04/2002; changed prn" (02/14/2013)   Family History  Problem Relation Age of Onset  . Cancer Mother   . Cirrhosis Father    Social History  Substance Use Topics  . Smoking status: Never Smoker   . Smokeless tobacco: Never Used  . Alcohol Use: Yes     Comment: 02/14/2013 "socially; couple times/month"   OB History    No data available     Review of Systems  Constitutional: Negative for fever.  HENT: Negative for rhinorrhea and sore throat.   Eyes: Positive for photophobia and visual disturbance.  Respiratory:  Negative for cough and shortness of breath.   Cardiovascular: Negative for chest pain and leg swelling.  Gastrointestinal: Positive for nausea. Negative for vomiting, abdominal pain and diarrhea.  Genitourinary: Negative for dysuria.  Musculoskeletal: Positive for back pain (lower) and neck pain.  Skin: Negative for rash.  Neurological: Positive for numbness (tingling in the fingers and toes (R side only)) and headaches.  Hematological: Does not bruise/bleed easily.    Allergies  Apple; Aspirin; and Carrot  Home Medications   Prior to Admission medications   Medication Sig Start Date End Date Taking? Authorizing Provider  baclofen (LIORESAL) 10 MG tablet Take 1 tablet (10 mg total) by mouth 3 (three) times daily as needed for muscle spasms. 01/15/15   Joanna Puff, MD  butalbital-acetaminophen-caffeine (FIORICET, ESGIC) [phone] MG per tablet Take 1 tablet by mouth every 6 (six) hours as needed. For migraine 11/25/14   Historical Provider, MD  cloNIDine (CATAPRES) 0.1 MG tablet TAKE 1 TABLET BY MOUTH TWICE DAILY 04/26/14   Glori Luis, MD  EPIPEN 2-PAK 0.3 MG/0.3ML SOAJ injection INJECT 1 SYRINGE IN THE MUSCLE ONCE 02/03/15   Ardith Dark, MD  ergotamine-caffeine (CAFERGOT) 1-100 MG per tablet Two tablets at onset of attack; then 1 tablet every 30 minutes as needed; maximum: 6 tablets per attack; do not exceed 10 tablets/week Patient taking differently: Take 2 tablets  by mouth every hour as needed for migraine. Two tablets at onset of attack; then 1 tablet every 30 minutes as needed; maximum: 6 tablets per attack; do not exceed 10 tablets/week 04/19/14   [name], MD  hydrOXYzine (ATARAX/VISTARIL) 25 MG tablet Take 1 tablet (25 mg total) by mouth 3 (three) times daily as needed. Patient taking differently: Take 25 mg by mouth 3 (three) times daily as needed for anxiety.  06/25/14   Glori Luis, MD  lisinopril (PRINIVIL,ZESTRIL) 5 MG tablet Take 1 tablet (5 mg total) by mouth  daily. 09/05/14   Glori Luis, MD  ondansetron (ZOFRAN) 4 MG tablet Take 4 mg by mouth every 8 (eight) hours as needed. For nausea/vomiting. 10/20/14   Historical Provider, MD  oxyCODONE (OXY IR/ROXICODONE) 5 MG immediate release tablet Take 5 mg by mouth every 12 (twelve) hours as needed. Every 12 hrs prn    Historical Provider, MD  promethazine (PHENERGAN) 25 MG suppository Place 1 suppository (25 mg total) rectally every 6 (six) hours as needed for nausea or vomiting. 01/15/15   Joanna Puff, MD  propranolol (INDERAL) 20 MG tablet Take 1 tablet (20 mg total) by mouth 2 (two) times daily. Increase to 2 tablets (40mg  in the evening) 01/15/15   Joanna Puff, MD  sennosides-docusate sodium (SENOKOT-S) 8.6-50 MG tablet Take 1 tablet by mouth daily as needed for constipation.    Historical Provider, MD  topiramate (TOPAMAX) 100 MG tablet Take 1.5 tablets (150 mg total) by mouth 2 (two) times daily. 01/13/15   Ardith Dark, MD   BP 145/93 mmHg  Pulse 63  Temp(Src) 98.8 F (37.1 C) (Oral)  Resp 16  Ht 5\' 5"  (1.651 m)  Wt 198 lb (89.812 kg)  BMI 32.95 kg/m2  SpO2 100% Physical Exam  Constitutional: She is oriented to person, place, and time. She appears well-developed and well-nourished.  HENT:  Head: Normocephalic and atraumatic.  Mouth/Throat: Oropharynx is clear and moist.  Eyes: Conjunctivae and EOM are normal. Pupils are equal, round, and reactive to light. Right eye exhibits no discharge. Left eye exhibits no discharge. No scleral icterus.  Cardiovascular: Normal rate, regular rhythm and normal heart sounds.   Pulmonary/Chest: Effort normal and breath sounds normal. No respiratory distress.  Abdominal: Soft. Bowel sounds are normal. There is no tenderness.  Musculoskeletal: She exhibits edema.  Trace swelling to both ankles  Neurological: She is alert and oriented to person, place, and time. No cranial nerve deficit. She exhibits normal muscle tone. Coordination normal.  Skin:  Skin is warm and dry. No rash noted. She is not diaphoretic. No erythema.  Psychiatric: She has a normal mood and affect.  Nursing note and vitals reviewed.   ED Course  Procedures  DIAGNOSTIC STUDIES: Oxygen Saturation is 100% on RA, normal by my interpretation.    COORDINATION OF CARE: 9:48 PM - Discussed plans to order a migraine cocktail. Pt advised of plan for treatment and pt agrees.  Labs Review Labs Reviewed - No data to display  Imaging Review No results found. I have personally reviewed and evaluated these images and lab results as part of my medical decision-making.   EKG Interpretation None      MDM   Final diagnoses:  Intractable migraine without aura and with status migrainosus    Patient with history of migraine type headaches every day goes through periods of acute exacerbation. She been having increased pain over the past few days. Patient nontoxic no acute distress.  No fever. Patient treated with migraine cocktail IV normal saline fluids. Patient received Benadryl 25 mg Phenergan 12.5 mg and Decadron 10 mg IV. No real significant improvement initially. Patient will be discharged home to rest and follow-up with her neurologist if not improving in the morning.  I personally performed the services described in this documentation, which was scribed in my presence. The recorded information has been reviewed and is accurate.    Vanetta Mulders, MD 03/02/15 [phone]

## 2015-03-02 NOTE — ED Notes (Signed)
Pt reports hx of migraines.  Reports has migraines everyday but this has been getting worse for the past 3-4 days.  Reports nausea associated with it.  Pt is unsteady on feet.

## 2015-03-02 NOTE — ED Notes (Signed)
Patient states that she is very nauseated and has Migraine HA's every day. The patient is sitting in the room with all the lights on with no noted distress or grimacing. Patient states she is probably dehydrated, however denies any vomiting, only has had nausea

## 2015-03-02 NOTE — Discharge Instructions (Signed)
Return for any new or worse symptoms. Go home and rest in a dark room. If headache is not resolved follow-up with your neurologist in the morning.

## 2015-03-03 NOTE — ED Notes (Signed)
Patient's meds given and small area of swelling noted to the side of the IV and patient made aware that the IVF was infusing well. Denies that she is having any pain at the IV site, site is pulling blood back. The patient made aware that if she has any pain or further swelling to the area to notify the RN

## 2015-05-28 ENCOUNTER — Other Ambulatory Visit: Payer: Self-pay | Admitting: *Deleted

## 2015-05-28 DIAGNOSIS — I10 Essential (primary) hypertension: Secondary | ICD-10-CM

## 2015-05-28 MED ORDER — CLONIDINE HCL 0.1 MG PO TABS: 0.1000 mg | ORAL_TABLET | Freq: Two times a day (BID) | ORAL | Status: DC

## 2015-05-28 MED ORDER — LISINOPRIL 5 MG PO TABS: 5.0000 mg | ORAL_TABLET | Freq: Every day | ORAL | Status: DC

## 2015-05-28 NOTE — Telephone Encounter (Signed)
Rx filled.  Kirsten Degreealeb M. Jimmey RalphParker, MD [facility] Resident PGY-2 05/28/2015 5:03 PM

## 2015-07-04 ENCOUNTER — Ambulatory Visit (INDEPENDENT_AMBULATORY_CARE_PROVIDER_SITE_OTHER): Payer: Medicaid Other | Admitting: *Deleted

## 2015-07-04 DIAGNOSIS — Z0184 Encounter for antibody response examination: Secondary | ICD-10-CM

## 2015-07-04 DIAGNOSIS — Z23 Encounter for immunization: Secondary | ICD-10-CM | POA: Diagnosis present

## 2015-07-04 NOTE — Progress Notes (Signed)
    Kirsten Walsh presents for immunizations.    Screening questions for immunizations: 1. Are you sick today?  no 2. Do you have allergies to medications, foods, or any vaccines?  no 3. Have you ever had a serious reaction after receiving a vaccination?  no 4. Do you have a long-term health problem with heart disease, asthma, lung disease, kidney disease, metabolic disease (e.g. diabetes), anemia, or other blood disorder?  no 5. Have you had a seizure, brain problem, or other nervous system problem?  no 6. Do you have cancer, leukemia, AIDS, or any other immune system problem?  no 7. Do you take cortisone, prednisone, other steroids, anticancer drugs or have you had radiation treatments?  no 8. Have you received a transfusion of blood or blood products, or been given immune (gamma) globulin or an antiviral drug in the past year?  no 9. Have you received vaccinations in the past 4 weeks?  no 10. FEMALES ONLY: Are you pregnant or is there a chance you could become pregnant during the next month?  no   Kirsten Walsh, Kirsten Gent L, RN

## 2015-07-07 ENCOUNTER — Telehealth: Payer: Self-pay | Admitting: Family Medicine

## 2015-07-07 LAB — MEASLES/MUMPS/RUBELLA IMMUNITY
Mumps IgG: 109 AU/mL — ABNORMAL HIGH (ref <9.00)
Rubella: 2.85 Index — ABNORMAL HIGH (ref <0.90)
Rubeola IgG: 11.2 AU/mL (ref <25.00)

## 2015-07-07 NOTE — Telephone Encounter (Signed)
I did not see the patient on Friday. It looks like she was seen for a nursing visit and had MMR titers drawn. Her titers came back elevated, which means she has been vaccinated or had the virus in the past.  Kirsten Walsh. Jerline Pain, [place] Resident PGY-2 07/07/2015 2:26 PM

## 2015-07-07 NOTE — Telephone Encounter (Signed)
Will forward to MD to confirm results. Joniqua Sidle,CMA

## 2015-07-07 NOTE — Telephone Encounter (Signed)
Pt called and would like to know what her lab results from last Friday are. jw

## 2015-07-07 NOTE — Telephone Encounter (Signed)
Patient is aware of results and they have been placed up front for her to pick up. Kirsten Walsh,CMA

## 2015-07-08 ENCOUNTER — Ambulatory Visit: Payer: Medicaid Other | Admitting: Family Medicine

## 2015-07-15 ENCOUNTER — Emergency Department (HOSPITAL_COMMUNITY)
Admission: EM | Admit: 2015-07-15 | Discharge: 2015-07-16 | Disposition: A | Discharge status: Home / Self Care | Payer: Medicaid Other | Attending: Emergency Medicine | Admitting: Emergency Medicine

## 2015-07-15 ENCOUNTER — Encounter: Payer: Self-pay | Admitting: Family Medicine

## 2015-07-15 ENCOUNTER — Encounter (HOSPITAL_COMMUNITY): Payer: Self-pay | Admitting: Emergency Medicine

## 2015-07-15 ENCOUNTER — Ambulatory Visit (INDEPENDENT_AMBULATORY_CARE_PROVIDER_SITE_OTHER): Payer: Medicaid Other | Admitting: Family Medicine

## 2015-07-15 ENCOUNTER — Other Ambulatory Visit: Payer: Self-pay

## 2015-07-15 VITALS — BP 178/93 | HR 87 | Temp 98.5°F | Wt 209.9 lb

## 2015-07-15 DIAGNOSIS — G43511 Persistent migraine aura without cerebral infarction, intractable, with status migrainosus: Secondary | ICD-10-CM | POA: Diagnosis not present

## 2015-07-15 DIAGNOSIS — Z87442 Personal history of urinary calculi: Secondary | ICD-10-CM | POA: Insufficient documentation

## 2015-07-15 DIAGNOSIS — G43101 Migraine with aura, not intractable, with status migrainosus: Secondary | ICD-10-CM

## 2015-07-15 DIAGNOSIS — R55 Syncope and collapse: Secondary | ICD-10-CM | POA: Diagnosis present

## 2015-07-15 DIAGNOSIS — E875 Hyperkalemia: Secondary | ICD-10-CM | POA: Insufficient documentation

## 2015-07-15 DIAGNOSIS — I10 Essential (primary) hypertension: Secondary | ICD-10-CM | POA: Diagnosis not present

## 2015-07-15 DIAGNOSIS — G43501 Persistent migraine aura without cerebral infarction, not intractable, with status migrainosus: Secondary | ICD-10-CM

## 2015-07-15 DIAGNOSIS — G8929 Other chronic pain: Secondary | ICD-10-CM | POA: Insufficient documentation

## 2015-07-15 DIAGNOSIS — Z79899 Other long term (current) drug therapy: Secondary | ICD-10-CM | POA: Diagnosis not present

## 2015-07-15 DIAGNOSIS — Z8719 Personal history of other diseases of the digestive system: Secondary | ICD-10-CM | POA: Diagnosis not present

## 2015-07-15 LAB — CBC WITH DIFFERENTIAL/PLATELET
Basophils Absolute: 0 10*3/uL (ref 0.0–0.1)
Basophils Relative: 0 %
Eosinophils Absolute: 0 10*3/uL (ref 0.0–0.7)
Eosinophils Relative: 0 %
HEMATOCRIT: 44.3 % (ref 36.0–46.0)
Hemoglobin: 14.8 g/dL (ref 12.0–15.0)
LYMPHS PCT: 7 %
Lymphs Abs: 0.5 10*3/uL — ABNORMAL LOW (ref 0.7–4.0)
MCH: 30.6 pg (ref 26.0–34.0)
MCHC: 33.4 g/dL (ref 30.0–36.0)
MCV: 91.5 fL (ref 78.0–100.0)
Monocytes Absolute: 0.1 10*3/uL (ref 0.1–1.0)
Monocytes Relative: 1 %
NEUTROS ABS: 6.7 10*3/uL (ref 1.7–7.7)
Neutrophils Relative %: 92 %
Platelets: 238 10*3/uL (ref 150–400)
RBC: 4.84 MIL/uL (ref 3.87–5.11)
RDW: 13.8 % (ref 11.5–15.5)
WBC: 6.8 10*3/uL (ref 4.0–10.5)

## 2015-07-15 LAB — URINALYSIS, ROUTINE W REFLEX MICROSCOPIC
BILIRUBIN URINE: NEGATIVE
Glucose, UA: NEGATIVE mg/dL
Hgb urine dipstick: NEGATIVE
KETONES UR: NEGATIVE mg/dL
NITRITE: NEGATIVE
PROTEIN: NEGATIVE mg/dL
Specific Gravity, Urine: 1.013 (ref 1.005–1.030)
pH: 8.5 — ABNORMAL HIGH (ref 5.0–8.0)

## 2015-07-15 LAB — URINE MICROSCOPIC-ADD ON

## 2015-07-15 LAB — BASIC METABOLIC PANEL
ANION GAP: 12 (ref 5–15)
BUN: 8 mg/dL (ref 6–20)
CO2: 19 mmol/L — AB (ref 22–32)
Calcium: 9.4 mg/dL (ref 8.9–10.3)
Chloride: 109 mmol/L (ref 101–111)
Creatinine, Ser: 1.17 mg/dL — ABNORMAL HIGH (ref 0.44–1.00)
GFR calc Af Amer: >60 mL/min (ref >60)
GFR calc non Af Amer: 53 mL/min — ABNORMAL LOW (ref >60)
GLUCOSE: 130 mg/dL — AB (ref 65–99)
POTASSIUM: 5.4 mmol/L — AB (ref 3.5–5.1)
Sodium: 140 mmol/L (ref 135–145)

## 2015-07-15 LAB — CBG MONITORING, ED: GLUCOSE-CAPILLARY: 126 mg/dL — AB (ref 65–99)

## 2015-07-15 LAB — I-STAT TROPONIN, ED: Troponin i, poc: 0.01 ng/mL (ref 0.00–0.08)

## 2015-07-15 MED ORDER — DEXAMETHASONE SODIUM PHOSPHATE 10 MG/ML IJ SOLN
10.0000 mg | Freq: Once | INTRAMUSCULAR | Status: AC
  Administered 2015-07-15: 10 mg via INTRAVENOUS
  Filled 2015-07-15: qty 1

## 2015-07-15 MED ORDER — DIPHENHYDRAMINE HCL 50 MG/ML IJ SOLN: 25.0000 mg | Freq: Once | INTRAMUSCULAR | Status: DC

## 2015-07-15 MED ORDER — PROMETHAZINE HCL 25 MG/ML IJ SOLN: 25.0000 mg | Freq: Once | INTRAMUSCULAR | Status: DC

## 2015-07-15 MED ORDER — SODIUM CHLORIDE 0.9 % IV BOLUS (SEPSIS)
1000.0000 mL | Freq: Once | INTRAVENOUS | Status: AC
Start: 2015-07-15 — End: 2015-07-16
  Administered 2015-07-15: 1000 mL via INTRAVENOUS

## 2015-07-15 MED ORDER — HYDROMORPHONE HCL 1 MG/ML IJ SOLN
1.0000 mg | Freq: Once | INTRAMUSCULAR | Status: AC
  Administered 2015-07-15: 1 mg via INTRAVENOUS
  Filled 2015-07-15: qty 1

## 2015-07-15 MED ORDER — PROMETHAZINE HCL 25 MG/ML IJ SOLN
25.0000 mg | Freq: Once | INTRAMUSCULAR | Status: AC
  Administered 2015-07-15: 25 mg via INTRAMUSCULAR

## 2015-07-15 MED ORDER — KETOROLAC TROMETHAMINE 60 MG/2ML IM SOLN
60.0000 mg | Freq: Once | INTRAMUSCULAR | Status: AC
  Administered 2015-07-15: 60 mg via INTRAMUSCULAR

## 2015-07-15 MED ORDER — DEXAMETHASONE SODIUM PHOSPHATE 10 MG/ML IJ SOLN: 10.0000 mg | Freq: Once | INTRAMUSCULAR | Status: DC

## 2015-07-15 MED ORDER — PROCHLORPERAZINE EDISYLATE 5 MG/ML IJ SOLN
10.0000 mg | Freq: Once | INTRAMUSCULAR | Status: AC
  Administered 2015-07-15: 10 mg via INTRAVENOUS
  Filled 2015-07-15: qty 2

## 2015-07-15 MED ORDER — DEXAMETHASONE SODIUM PHOSPHATE 10 MG/ML IJ SOLN
10.0000 mg | Freq: Once | INTRAMUSCULAR | Status: AC
  Administered 2015-07-15: 10 mg via INTRAMUSCULAR

## 2015-07-15 MED ORDER — HYDROCHLOROTHIAZIDE 25 MG PO TABS: 25.0000 mg | ORAL_TABLET | Freq: Two times a day (BID) | ORAL | Status: DC

## 2015-07-15 MED ORDER — MORPHINE SULFATE (PF) 4 MG/ML IV SOLN
4.0000 mg | Freq: Once | INTRAVENOUS | Status: AC
  Administered 2015-07-15: 4 mg via INTRAVENOUS
  Filled 2015-07-15: qty 1

## 2015-07-15 MED ORDER — KETOROLAC TROMETHAMINE 60 MG/2ML IM SOLN: 60.0000 mg | Freq: Once | INTRAMUSCULAR | Status: DC

## 2015-07-15 MED ORDER — HYDROCHLOROTHIAZIDE 12.5 MG PO TABS: 25.0000 mg | ORAL_TABLET | Freq: Two times a day (BID) | ORAL | Status: DC

## 2015-07-15 MED ORDER — SODIUM CHLORIDE 0.9 % IV BOLUS (SEPSIS)
1000.0000 mL | Freq: Once | INTRAVENOUS | Status: AC
  Administered 2015-07-15: 1000 mL via INTRAVENOUS

## 2015-07-15 MED ORDER — DIPHENHYDRAMINE HCL 50 MG/ML IJ SOLN
25.0000 mg | Freq: Once | INTRAMUSCULAR | Status: AC
  Administered 2015-07-15: 25 mg via INTRAVENOUS
  Filled 2015-07-15: qty 1

## 2015-07-15 NOTE — ED Notes (Signed)
Attempted IV x 2. Placed order for IV team consult.

## 2015-07-15 NOTE — ED Notes (Signed)
PER EMS: Patient to ED from class after witnessed syncopal episode (x few seconds) d/t migraine that has been progressing for a week. Patient has had syncopal episodes in the past r/t migraines, all other workups have come back normal. Patient was witnessed to have hit R side of face, c/o tenderness and R knee pain after waking up. GCS 15, A&O x 4. Patient was seen by PCP and given a few medicines - phenergan, toradol, and something else - pt reports no relief. Also c/o nausea, per EMS patient has been dry heaving en route. HX migraines, HTN - takes Fioricet and Topamax for migraines and lisinopril and clonidine for HTN). EMS VS: 180/120, 96 NSR, RR 22, 98% RA, CBG 184.

## 2015-07-15 NOTE — Progress Notes (Signed)
    Subjective:  Kirsten Walsh is a 55 y.o. female who presents to the [facility] today with a chief complaint of migraine   HPI:  Migraine Patient with long standing history of migraine disorder. Has been seen at neurology in the past. Current migraine has been going on for the past week. She has tried several medications including fiorcet and phenergan suppository which did not help. She has had some nausea and vomiting. No vision changes. Has right sided weakness that is at her baseline. Has not seen neurology in several months.   ROS: Per HPI   Objective:  Physical Exam: BP 178/93 mmHg  Pulse 87  Temp(Src) 98.5 F (36.9 C) (Oral)  Wt 209 lb 14.4 oz (95.21 kg)  Gen: Ill appearing female sitting in chair CV: RRR with no murmurs appreciated Pulm: NWOB, CTAB with no crackles, wheezes, or rhonchi GI: Normal bowel sounds present. Soft, Nontender, Nondistended. Skin: warm, dry Neuro: CN2-12 intact. Right sided strength 4+/5, left sided strength 5/5 throughout. Sensation intact bilaterally Psych: Normal affect and thought content  Assessment/Plan:  Intractable migraine with status migrainosus Migraine cocktail of toradol, phenergan, and decadron given in clinic today with improvement in symptoms. Neuro exam stable. Instructed patient to follow up with neurology as soon as possible. May consider restarting imitrex. Return precautions reviewed.   Katina Degree. Jimmey Ralph, MD [facility] Resident PGY-2 07/15/2015 5:21 PM

## 2015-07-15 NOTE — ED Provider Notes (Signed)
CSN: [id number]     Arrival date & time 07/15/15  1946 History   First MD Initiated Contact with Patient 07/15/15 1948     Chief Complaint  Patient presents with  . Migraine  . Loss of Consciousness     (Consider location/radiation/quality/duration/timing/severity/associated sxs/prior Treatment) HPI  Kirsten Walsh Is a 55 year old female with chronic migraine headaches who presents emergency Department with severe migraine headache and syncope. She states she has had this happen before, during migraine headaches. She has had a migraine for nearly 1 week and has been taking her home medications without relief. She complains of right-sided headache with associated nausea and vomiting along with "silver streaks" in her vision. She states that the pain is progressively worsening and is consistent with her normal migraine headaches. Today she was in class when she had severe nausea. She got up. Return to the bathroom and lost consciousness. Her classmates told EMS that she was on the unconscious for a couple seconds. EMS was called by her class. She has a little bit of tenderness on the right side of her face without hematoma and a little bit of knee pain but was ambulatory at the scene. Patient states that she has been taking her antihypertensives, however, has a initial pressure of 180/120 and a history of hypertension. She has chest pain, shortness of breath. She has some numbness and tingling in the upper and lower left extremity which is also consistent with her migraines, but without any weakness, difficulty swallowing or speaking. Patient states she is unsure which migraine cocktail works best for her, as she states it is "hit or miss." Patient denies racing heart, blood loss, melena or hematochezia.  Past Medical History  Diagnosis Date  . Hypertension   . Chest pain   . Gall stones   . Migraine     "qd" (02/14/2013)  . Chronic lower back pain     "right side to mid back" (02/14/2013)   . Kidney stone ~ 2006   Past Surgical History  Procedure Laterality Date  . Laparoscopic cholecystectomy  ~ 2004  . Cystoscopy w/ stone manipulation  ~ 2006  . Intrauterine device insertion      "initially put in in 04/2002; changed prn" (02/14/2013)   Family History  Problem Relation Age of Onset  . Cancer Mother   . Cirrhosis Father    Social History  Substance Use Topics  . Smoking status: Never Smoker   . Smokeless tobacco: Never Used  . Alcohol Use: Yes     Comment: 02/14/2013 "socially; couple times/month"   OB History    No data available     Review of Systems  Ten systems reviewed and are negative for acute change, except as noted in the HPI.    Allergies  Apple; Aspirin; and Carrot  Home Medications   Prior to Admission medications   Medication Sig Start Date End Date Taking? Authorizing Provider  baclofen (LIORESAL) 10 MG tablet Take 1 tablet (10 mg total) by mouth 3 (three) times daily as needed for muscle spasms. 01/15/15  Yes Joanna Puff, MD  butalbital-acetaminophen-caffeine (FIORICET, ESGIC) 50-325-40 MG per tablet Take 1 tablet by mouth every 6 (six) hours as needed. For migraine 11/25/14  Yes Historical Provider, MD  cloNIDine (CATAPRES) 0.1 MG tablet Take 1 tablet (0.1 mg total) by mouth 2 (two) times daily. 05/28/15  Yes Ardith Dark, MD  EPIPEN 2-PAK 0.3 MG/0.3ML SOAJ injection INJECT 1 SYRINGE IN THE MUSCLE ONCE 02/03/15  Yes Ardith Dark, MD  ergotamine-caffeine (CAFERGOT) 1-100 MG per tablet Two tablets at onset of attack; then 1 tablet every 30 minutes as needed; maximum: 6 tablets per attack; do not exceed 10 tablets/week Patient taking differently: Take 2 tablets by mouth every hour as needed for migraine. Two tablets at onset of attack; then 1 tablet every 30 minutes as needed; maximum: 6 tablets per attack; do not exceed 10 tablets/week 04/19/14  Yes [name], MD  lisinopril (PRINIVIL,ZESTRIL) 5 MG tablet Take 1 tablet (5 mg total) by mouth  daily. 05/28/15  Yes Ardith Dark, MD  ondansetron (ZOFRAN) 4 MG tablet Take 4 mg by mouth every 8 (eight) hours as needed. For nausea/vomiting. 10/20/14  Yes Historical Provider, MD  propranolol (INDERAL) 20 MG tablet Take 1 tablet (20 mg total) by mouth 2 (two) times daily. Increase to 2 tablets (  in the evening) 01/15/15  Yes Joanna Puff, MD  sennosides-docusate sodium (SENOKOT-S) 8.6-50 MG tablet Take 1 tablet by mouth daily as needed for constipation.   Yes Historical Provider, MD  topiramate (TOPAMAX) 100 MG tablet Take 1.5 tablets (150 mg total) by mouth 2 (two) times daily. 01/13/15  Yes Ardith Dark, MD  hydrochlorothiazide (HYDRODIURIL) 25 MG tablet Take 1 tablet (25 mg total) by mouth 2 (two) times daily. 07/15/15   Kash Davie, PA-C   BP 147/86 mmHg  Pulse 83  Resp 19  SpO2 99% Physical Exam  Constitutional: She is oriented to person, place, and time. She appears well-developed and well-nourished. No distress.  HENT:  Head: Normocephalic and atraumatic.  Mouth/Throat: Oropharynx is clear and moist.  Eyes: Conjunctivae and EOM are normal. Pupils are equal, round, and reactive to light. No scleral icterus.  No horizontal, vertical or rotational nystagmus  Neck: Normal range of motion. Neck supple.  Full active and passive ROM without pain No midline or paraspinal tenderness No nuchal rigidity or meningeal signs  Cardiovascular: Normal rate, regular rhythm and intact distal pulses.   Pulmonary/Chest: Effort normal and breath sounds normal. No respiratory distress. She has no wheezes. She has no rales.  Abdominal: Soft. Bowel sounds are normal. There is no tenderness. There is no rebound and no guarding.  Musculoskeletal: Normal range of motion.  Lymphadenopathy:    She has no cervical adenopathy.  Neurological: She is alert and oriented to person, place, and time. She has normal reflexes. No cranial nerve deficit. She exhibits normal muscle tone. Coordination normal.   Mental Status:  Alert, oriented, thought content appropriate. Speech fluent without evidence of aphasia. Able to follow 2 step commands without difficulty.  Cranial Nerves:  II:  Peripheral visual fields grossly normal, pupils equal, round, reactive to light III,IV, VI: ptosis not present, extra-ocular motions intact bilaterally  V,VII: smile symmetric, facial light touch sensation equal VIII: hearing grossly normal bilaterally  IX,X: midline uvula rise  XI: bilateral shoulder shrug equal and strong XII: midline tongue extension  Motor:  5/5 in upper and lower extremities bilaterally including strong and equal grip strength and dorsiflexion/plantar flexion Sensory: Pinprick and light touch normal in all extremities.  Deep Tendon Reflexes: 2+ and symmetric  Cerebellar: normal finger-to-nose with bilateral upper extremities Gait: normal gait and balance CV: distal pulses palpable throughout   Skin: Skin is warm and dry. No rash noted. She is not diaphoretic.  Psychiatric: She has a normal mood and affect. Her behavior is normal. Judgment and thought content normal.  Nursing note and vitals reviewed.   ED Course  Procedures (including critical care time) Labs Review Labs Reviewed  CBC WITH DIFFERENTIAL/PLATELET - Abnormal; Notable for the following:    Lymphs Abs 0.5 (*)    All other components within normal limits  BASIC METABOLIC PANEL - Abnormal; Notable for the following:    Potassium 5.4 (*)    CO2 19 (*)    Glucose, Bld 130 (*)    Creatinine, Ser 1.17 (*)    GFR calc non Af Amer 53 (*)    All other components within normal limits  URINALYSIS, ROUTINE W REFLEX MICROSCOPIC (NOT AT [facility]) - Abnormal; Notable for the following:    APPearance TURBID (*)    pH 8.5 (*)    Leukocytes, UA TRACE (*)    All other components within normal limits  URINE MICROSCOPIC-ADD ON - Abnormal; Notable for the following:    Squamous Epithelial / LPF 6-30 (*)    Bacteria, UA FEW (*)    All  other components within normal limits  CBG MONITORING, ED - Abnormal; Notable for the following:    Glucose-Capillary 126 (*)    All other components within normal limits  POCT CBG (FASTING - GLUCOSE)-MANUAL ENTRY  I-STAT TROPOININ, ED    Imaging Review No results found. I have personally reviewed and evaluated these images and lab results as part of my medical decision-making.   EKG Interpretation   Date/Time:  Tuesday July 15 2015 20:49:06 EST Ventricular Rate:  82 PR Interval:  163 QRS Duration: 95 QT Interval:  390 QTC Calculation: 455 R Axis:   30 Text Interpretation:  Sinus rhythm Low voltage, precordial leads normal no  change Confirmed by Donnald Garre, MD, Lebron Conners [phone]) on 07/15/2015 11:41:11 PM      MDM   Final diagnoses:  Migraine with aura and with status migrainosus, not intractable  Syncope, unspecified syncope type  Hyperkalemia, diminished renal excretion   Patient with hx of syncope with vomiting and migraines. She lives with status migainosus and is followed by neurology. I suspect vasovagal syncope.  Pt HA treated and improved while in ED.  Presentation is like pts typical HA and non concerning for [facility], ICH, Meningitis, or temporal arteritis. Pt is afebrile with no focal neuro deficits, nuchal rigidity, or change in vision. Pt is to follow up with her neurologist. Pt verbalizes understanding and is agreeable with plan to dc.    Arthor Captain, PA-C 07/20/15 1151  Arby Barrette, MD 07/27/15 [phone]

## 2015-07-15 NOTE — Patient Instructions (Signed)
Please ask your neurologist about restarting your imitrex. You may need to be on more controller medications.   Please come back to see me soon for a regular visit.  Take care,  Dr Jimmey Ralph

## 2015-07-15 NOTE — ED Notes (Signed)
Provided patient with ice pack for face. No bruising or deformity visible.

## 2015-07-15 NOTE — Discharge Instructions (Signed)
Renal function is slightly diminished and urine potassium is slightly elevated. This could be a side effect of dehydration from her nausea as well as use of your lisinopril. He should stop taking the lisinopril today. With any changes to hydrochlorothiazide. He'll take this 2 times a day. He will need to follow up within the next 1-2 days with her primary care physician to have your potassium level rechecked and to manage her hypertensive medications.  You are having a headache. No specific cause was found today for your headache. It may have been a migraine or other cause of headache. Stress, anxiety, fatigue, and depression are common triggers for headaches. Your headache today does not appear to be life-threatening or require hospitalization, but often the exact cause of headaches is not determined in the emergency department. Therefore, follow-up with your doctor is very important to find out what may have caused your headache, and whether or not you need any further diagnostic testing or treatment. Sometimes headaches can appear benign (not harmful), but then more serious symptoms can develop which should prompt an immediate re-evaluation by your doctor or the emergency department. SEEK MEDICAL ATTENTION IF: You develop possible problems with medications prescribed.  The medications don't resolve your headache, if it recurs , or if you have multiple episodes of vomiting or can't take fluids. You have a change from the usual headache. RETURN IMMEDIATELY IF you develop a sudden, severe headache or confusion, become poorly responsive or faint, develop a fever above 100.54F or problem breathing, have a change in speech, vision, swallowing, or understanding, or develop new weakness, numbness, tingling, incoordination, or have a seizure.  Hyperkalemia Hyperkalemia is when you have too much potassium in your blood. Potassium is normally removed (excreted) from your body by your kidneys. If there is too much  potassium in your blood, it can affect your heart's ability to function.  CAUSES  Hyperkalemia may be caused by:   Taking in too much potassium. You can do this by:  Using salt substitutes. They contain large amounts of potassium.  Taking potassium supplements.  Eating foods high in potassium.  Excreting too little potassium. This can happen if:  Your kidneys are not working properly. Kidney (renal) disease, including short- or long-term renal failure, is a very common cause of hyperkalemia.  You are taking medicines that lower your excretion of potassium.  You have Addison disease.  You have a urinary tract blockage, such as kidney stones.  You are on treatment to mechanically clean your blood (dialysis) and you skip a treatment.  Releasing a high amount of potassium from your cells into your blood. This can happen with:  Injury to muscles (rhabdomyolysis) or other tissues. Most potassium is stored in your muscles.  Severe burns or infections.  Acidic blood plasma (acidosis). Acidosis can result from many diseases, such as uncontrolled diabetes. RISK FACTORS The most common risk factor of hyperkalemia is kidney disease. Other risk factors of hyperkalemia include:  Addison disease. This is a condition where your glands do not produce enough hormones.  Alcoholism or heavy drug use.   Using certain blood pressure medicines, such as angiotensin-converting enzyme (ACE) inhibitors, angiotensin II receptor blockers (ARBs), or potassium-sparing diuretics such as spironolactone.  Severe injury or burn. SIGNS AND SYMPTOMS  Oftentimes, there are no signs or symptoms of hyperkalemia. However, when your potassium level becomes high enough, you may experience symptoms such as:  Irregular or very slow heartbeat.  Nausea.  Fatigue.  Tingling of the skin or  numbness of the hands or feet.  Muscle weakness.  Fatigue.  Not being able to move (paralysis). You may not have any  symptoms of hyperkalemia.  DIAGNOSIS  Hyperkalemia may be diagnosed by:  Physical exam.  Blood tests.  ECG (electrocardiogram).  Discussion of prescription and non-prescription drug use. TREATMENT  Treatment for hyperkalemia is often directed at the underlying cause. In some instances, treatment may include:   Insulin.  Glucose (sugar) and water solution given through a vein (intravenous or IV).  Dialysis.  Medicines to remove the potassium from your body.  Medicines to move calcium from your bloodstream into your tissues. HOME CARE INSTRUCTIONS   Take medicines only as directed by your health care provider.  Do not take any supplements, natural products, herbs, or vitamins without reviewing them with your health care provider. Certain supplements and natural food products can have high amounts of potassium.  Limit your alcohol intake as directed by your health care provider.  Stop illegal drug use. If you need help quitting, ask your health care provider.  Keep all follow-up visits as directed by your health care provider. This is important.  If you have kidney disease, you may need to follow a low potassium diet. A dietitian can help educate you on low potassium foods. SEEK MEDICAL CARE IF:   You notice an irregular or very slow heartbeat.  You feel light-headed.  You feel weak.  You are nauseous.  You have tingling or numbness in your hands or feet. SEEK IMMEDIATE MEDICAL CARE IF:   You have shortness of breath.  You have chest pain or discomfort.  You pass out.  You have muscle paralysis. MAKE SURE YOU:   Understand these instructions.  Will watch your condition.  Will get help right away if you are not doing well or get worse.   This information is not intended to replace advice given to you by your health care provider. Make sure you discuss any questions you have with your health care provider.   Document Released: 05/28/2002 Document Revised:  06/28/2014 Document Reviewed: 09/12/2013 Elsevier Interactive Patient Education 2016 [employer].  Migraine Headache A migraine headache is an intense, throbbing pain on one or both sides of your head. A migraine can last for 30 minutes to several hours. CAUSES  The exact cause of a migraine headache is not always known. However, a migraine may be caused when nerves in the brain become irritated and release chemicals that cause inflammation. This causes pain. Certain things may also trigger migraines, such as:  Alcohol.  Smoking.  Stress.  Menstruation.  Aged cheeses.  Foods or drinks that contain nitrates, glutamate, aspartame, or tyramine.  Lack of sleep.  Chocolate.  Caffeine.  Hunger.  Physical exertion.  Fatigue.  Medicines used to treat chest pain (nitroglycerine), birth control pills, estrogen, and some blood pressure medicines. SIGNS AND SYMPTOMS  Pain on one or both sides of your head.  Pulsating or throbbing pain.  Severe pain that prevents daily activities.  Pain that is aggravated by any physical activity.  Nausea, vomiting, or both.  Dizziness.  Pain with exposure to bright lights, loud noises, or activity.  General sensitivity to bright lights, loud noises, or smells. Before you get a migraine, you may get warning signs that a migraine is coming (aura). An aura may include:  Seeing flashing lights.  Seeing bright spots, halos, or zigzag lines.  Having tunnel vision or blurred vision.  Having feelings of numbness or tingling.  Having  trouble talking.  Having muscle weakness. DIAGNOSIS  A migraine headache is often diagnosed based on:  Symptoms.  Physical exam.  A CT scan or MRI of your head. These imaging tests cannot diagnose migraines, but they can help rule out other causes of headaches. TREATMENT Medicines may be given for pain and nausea. Medicines can also be given to help prevent recurrent migraines.  HOME CARE  INSTRUCTIONS  Only take over-the-counter or prescription medicines for pain or discomfort as directed by your health care provider. The use of long-term narcotics is not recommended.  Lie down in a dark, quiet room when you have a migraine.  Keep a journal to find out what may trigger your migraine headaches. For example, write down:  What you eat and drink.  How much sleep you get.  Any change to your diet or medicines.  Limit alcohol consumption.  Quit smoking if you smoke.  Get 7-9 hours of sleep, or as recommended by your health care provider.  Limit stress.  Keep lights dim if bright lights bother you and make your migraines worse. SEEK IMMEDIATE MEDICAL CARE IF:   Your migraine becomes severe.  You have a fever.  You have a stiff neck.  You have vision loss.  You have muscular weakness or loss of muscle control.  You start losing your balance or have trouble walking.  You feel faint or pass out.  You have severe symptoms that are different from your first symptoms. MAKE SURE YOU:   Understand these instructions.  Will watch your condition.  Will get help right away if you are not doing well or get worse.   This information is not intended to replace advice given to you by your health care provider. Make sure you discuss any questions you have with your health care provider.   Document Released: 06/07/2005 Document Revised: 06/28/2014 Document Reviewed: 02/12/2013 Elsevier Interactive Patient Education 2016 [employer].  Syncope Syncope is a medical term for fainting or passing out. This means you lose consciousness and drop to the ground. People are generally unconscious for less than 5 minutes. You may have some muscle twitches for up to 15 seconds before waking up and returning to normal. Syncope occurs more often in older adults, but it can happen to anyone. While most causes of syncope are not dangerous, syncope can be a sign of a serious medical  problem. It is important to seek medical care.  CAUSES  Syncope is caused by a sudden drop in blood flow to the brain. The specific cause is often not determined. Factors that can bring on syncope include:  Taking medicines that lower blood pressure.  Sudden changes in posture, such as standing up quickly.  Taking more medicine than prescribed.  Standing in one place for too long.  Seizure disorders.  Dehydration and excessive exposure to heat.  Low blood sugar (hypoglycemia).  Straining to have a bowel movement.  Heart disease, irregular heartbeat, or other circulatory problems.  Fear, emotional distress, seeing blood, or severe pain. SYMPTOMS  Right before fainting, you may:  Feel dizzy or light-headed.  Feel nauseous.  See all white or all black in your field of vision.  Have cold, clammy skin. DIAGNOSIS  Your health care provider will ask about your symptoms, perform a physical exam, and perform an electrocardiogram (ECG) to record the electrical activity of your heart. Your health care provider may also perform other heart or blood tests to determine the cause of your syncope which may  include:  Transthoracic echocardiogram (TTE). During echocardiography, sound waves are used to evaluate how blood flows through your heart.  Transesophageal echocardiogram (TEE).  Cardiac monitoring. This allows your health care provider to monitor your heart rate and rhythm in real time.  Holter monitor. This is a portable device that records your heartbeat and can help diagnose heart arrhythmias. It allows your health care provider to track your heart activity for several days, if needed.  Stress tests by exercise or by giving medicine that makes the heart beat faster. TREATMENT  In most cases, no treatment is needed. Depending on the cause of your syncope, your health care provider may recommend changing or stopping some of your medicines. HOME CARE INSTRUCTIONS  Have someone  stay with you until you feel stable.  Do not drive, use machinery, or play sports until your health care provider says it is okay.  Keep all follow-up appointments as directed by your health care provider.  Lie down right away if you start feeling like you might faint. Breathe deeply and steadily. Wait until all the symptoms have passed.  Drink enough fluids to keep your urine clear or pale yellow.  If you are taking blood pressure or heart medicine, get up slowly and take several minutes to sit and then stand. This can reduce dizziness. SEEK IMMEDIATE MEDICAL CARE IF:   You have a severe headache.  You have unusual pain in the chest, abdomen, or back.  You are bleeding from your mouth or rectum, or you have black or tarry stool.  You have an irregular or very fast heartbeat.  You have pain with breathing.  You have repeated fainting or seizure-like jerking during an episode.  You faint when sitting or lying down.  You have confusion.  You have trouble walking.  You have severe weakness.  You have vision problems. If you fainted, call your local emergency services (911 in [place] Do not drive yourself to the hospital.    This information is not intended to replace advice given to you by your health care provider. Make sure you discuss any questions you have with your health care provider.   Document Released: 06/07/2005 Document Revised: 10/22/2014 Document Reviewed: 08/06/2011 Elsevier Interactive Patient Education [employer]

## 2015-07-15 NOTE — Assessment & Plan Note (Signed)
Migraine cocktail of toradol, phenergan, and decadron given in clinic today with improvement in symptoms. Neuro exam stable. Instructed patient to follow up with neurology as soon as possible. May consider restarting imitrex. Return precautions reviewed.

## 2015-07-31 ENCOUNTER — Emergency Department (HOSPITAL_COMMUNITY)
Admission: EM | Admit: 2015-07-31 | Discharge: 2015-07-31 | Disposition: A | Discharge status: Home / Self Care | Payer: Medicaid Other | Attending: Emergency Medicine | Admitting: Emergency Medicine

## 2015-07-31 ENCOUNTER — Encounter (HOSPITAL_COMMUNITY): Payer: Self-pay | Admitting: Emergency Medicine

## 2015-07-31 DIAGNOSIS — Z8719 Personal history of other diseases of the digestive system: Secondary | ICD-10-CM | POA: Diagnosis not present

## 2015-07-31 DIAGNOSIS — Z3202 Encounter for pregnancy test, result negative: Secondary | ICD-10-CM | POA: Diagnosis not present

## 2015-07-31 DIAGNOSIS — R55 Syncope and collapse: Secondary | ICD-10-CM | POA: Diagnosis present

## 2015-07-31 DIAGNOSIS — Z79899 Other long term (current) drug therapy: Secondary | ICD-10-CM | POA: Diagnosis not present

## 2015-07-31 DIAGNOSIS — G8929 Other chronic pain: Secondary | ICD-10-CM | POA: Insufficient documentation

## 2015-07-31 DIAGNOSIS — I10 Essential (primary) hypertension: Secondary | ICD-10-CM | POA: Diagnosis not present

## 2015-07-31 DIAGNOSIS — Z87442 Personal history of urinary calculi: Secondary | ICD-10-CM | POA: Insufficient documentation

## 2015-07-31 DIAGNOSIS — G43809 Other migraine, not intractable, without status migrainosus: Secondary | ICD-10-CM | POA: Diagnosis not present

## 2015-07-31 LAB — CBC WITH DIFFERENTIAL/PLATELET
BASOS ABS: 0 10*3/uL (ref 0.0–0.1)
BASOS PCT: 1 %
EOS PCT: 5 %
Eosinophils Absolute: 0.2 10*3/uL (ref 0.0–0.7)
HCT: 38.9 % (ref 36.0–46.0)
Hemoglobin: 13.1 g/dL (ref 12.0–15.0)
Lymphocytes Relative: 36 %
Lymphs Abs: 1.4 10*3/uL (ref 0.7–4.0)
MCH: 31.2 pg (ref 26.0–34.0)
MCHC: 33.7 g/dL (ref 30.0–36.0)
MCV: 92.6 fL (ref 78.0–100.0)
MONO ABS: 0.3 10*3/uL (ref 0.1–1.0)
MONOS PCT: 7 %
Neutro Abs: 2 10*3/uL (ref 1.7–7.7)
Neutrophils Relative %: 51 %
PLATELETS: 191 10*3/uL (ref 150–400)
RBC: 4.2 MIL/uL (ref 3.87–5.11)
RDW: 13.9 % (ref 11.5–15.5)
WBC: 3.8 10*3/uL — ABNORMAL LOW (ref 4.0–10.5)

## 2015-07-31 LAB — BASIC METABOLIC PANEL
Anion gap: 9 (ref 5–15)
CALCIUM: 8.8 mg/dL — AB (ref 8.9–10.3)
CO2: 23 mmol/L (ref 22–32)
Chloride: 106 mmol/L (ref 101–111)
Creatinine, Ser: 0.96 mg/dL (ref 0.44–1.00)
GFR calc Af Amer: >60 mL/min (ref >60)
GLUCOSE: 98 mg/dL (ref 65–99)
Potassium: 4.4 mmol/L (ref 3.5–5.1)
Sodium: 138 mmol/L (ref 135–145)

## 2015-07-31 LAB — I-STAT BETA HCG BLOOD, ED (MC, WL, AP ONLY)

## 2015-07-31 LAB — CBG MONITORING, ED: Glucose-Capillary: 82 mg/dL (ref 65–99)

## 2015-07-31 MED ORDER — DEXAMETHASONE SODIUM PHOSPHATE 10 MG/ML IJ SOLN: 10.0000 mg | Freq: Once | INTRAMUSCULAR | Status: DC

## 2015-07-31 MED ORDER — METOCLOPRAMIDE HCL 5 MG/ML IJ SOLN
10.0000 mg | Freq: Once | INTRAMUSCULAR | Status: AC
  Administered 2015-07-31: 10 mg via INTRAVENOUS
  Filled 2015-07-31: qty 2

## 2015-07-31 MED ORDER — DIPHENHYDRAMINE HCL 50 MG/ML IJ SOLN
50.0000 mg | Freq: Once | INTRAMUSCULAR | Status: AC
  Administered 2015-07-31: 50 mg via INTRAVENOUS
  Filled 2015-07-31: qty 1

## 2015-07-31 MED ORDER — MAGNESIUM SULFATE 2 GM/50ML IV SOLN
2.0000 g | Freq: Once | INTRAVENOUS | Status: AC
  Administered 2015-07-31: 2 g via INTRAVENOUS
  Filled 2015-07-31: qty 50

## 2015-07-31 MED ORDER — FENTANYL CITRATE (PF) 100 MCG/2ML IJ SOLN
100.0000 ug | Freq: Once | INTRAMUSCULAR | Status: AC
  Administered 2015-07-31: 100 ug via INTRAVENOUS
  Filled 2015-07-31: qty 2

## 2015-07-31 MED ORDER — BUPIVACAINE HCL (PF) 0.5 % IJ SOLN
10.0000 mL | Freq: Once | INTRAMUSCULAR | Status: AC
  Administered 2015-07-31: 10 mL
  Filled 2015-07-31: qty 10

## 2015-07-31 MED ORDER — VALPROATE SODIUM 500 MG/5ML IV SOLN
500.0000 mg | Freq: Once | INTRAVENOUS | Status: DC
  Filled 2015-07-31: qty 5

## 2015-07-31 MED ORDER — HYDROMORPHONE HCL 1 MG/ML IJ SOLN
1.0000 mg | Freq: Once | INTRAMUSCULAR | Status: AC
  Administered 2015-07-31: 1 mg via INTRAVENOUS
  Filled 2015-07-31: qty 1

## 2015-07-31 NOTE — ED Provider Notes (Signed)
CSN: [id number]     Arrival date & time 07/31/15  1617 History   First MD Initiated Contact with Patient 07/31/15 1635     Chief Complaint  Patient presents with  . Loss of Consciousness  . Headache     (Consider location/radiation/quality/duration/timing/severity/associated sxs/prior Treatment) HPI Kirsten Walsh is a 55 y.o. female history of hypertension, migraines with syncope, comes in for evaluation of acute migraine with syncope. Per EMS, patient was at school when she started experiencing nausea and had sudden onset of migraine. Today characterized as right sided frontal sharp stabbing pain. States these symptoms are typical for her. She then had a syncopal episode which is also typical. LOC lasted approximately 3 minutes. EMS gave 4 mg of Zofran. Patient is alert and oriented 4 at this time. States she cannot take aspirin or its derivatives, "tongue swells, I vomit and stop breathing". Follows with [facility] neurology.  Past Medical History  Diagnosis Date  . Hypertension   . Chest pain   . Gall stones   . Migraine     "qd" (02/14/2013)  . Chronic lower back pain     "right side to mid back" (02/14/2013)  . Kidney stone ~ 2006   Past Surgical History  Procedure Laterality Date  . Laparoscopic cholecystectomy  ~ 2004  . Cystoscopy w/ stone manipulation  ~ 2006  . Intrauterine device insertion      "initially put in in 04/2002; changed prn" (02/14/2013)   Family History  Problem Relation Age of Onset  . Cancer Mother   . Cirrhosis Father    Social History  Substance Use Topics  . Smoking status: Never Smoker   . Smokeless tobacco: Never Used  . Alcohol Use: Yes     Comment: 02/14/2013 "socially; couple times/month"   OB History    No data available     Review of Systems A 10 point review of systems was completed and was negative except for pertinent positives and negatives as mentioned in the history of present illness     Allergies  Apple; Aspirin; and  Carrot  Home Medications   Prior to Admission medications   Medication Sig Start Date End Date Taking? Authorizing Provider  baclofen (LIORESAL) 10 MG tablet Take 1 tablet (10 mg total) by mouth 3 (three) times daily as needed for muscle spasms. 01/15/15   Joanna Puff, MD  butalbital-acetaminophen-caffeine (FIORICET, ESGIC) [phone] MG per tablet Take 1 tablet by mouth every 6 (six) hours as needed. For migraine 11/25/14   Historical Provider, MD  cloNIDine (CATAPRES) 0.1 MG tablet Take 1 tablet (0.1 mg total) by mouth 2 (two) times daily. 05/28/15   Ardith Dark, MD  EPIPEN 2-PAK 0.3 MG/0.3ML SOAJ injection INJECT 1 SYRINGE IN THE MUSCLE ONCE 02/03/15   Ardith Dark, MD  ergotamine-caffeine (CAFERGOT) 1-100 MG per tablet Two tablets at onset of attack; then 1 tablet every 30 minutes as needed; maximum: 6 tablets per attack; do not exceed 10 tablets/week Patient taking differently: Take 2 tablets by mouth every hour as needed for migraine. Two tablets at onset of attack; then 1 tablet every 30 minutes as needed; maximum: 6 tablets per attack; do not exceed 10 tablets/week 04/19/14   [name], MD  hydrochlorothiazide (HYDRODIURIL) 25 MG tablet Take 1 tablet (25 mg total) by mouth 2 (two) times daily. 07/15/15   Arthor Captain, PA-C  lisinopril (PRINIVIL,ZESTRIL) 5 MG tablet Take 1 tablet (5 mg total) by mouth daily. 05/28/15   Caleb  Doretha Imus, MD  ondansetron (ZOFRAN) 4 MG tablet Take 4 mg by mouth every 8 (eight) hours as needed. For nausea/vomiting. 10/20/14   Historical Provider, MD  propranolol (INDERAL) 20 MG tablet Take 1 tablet (20 mg total) by mouth 2 (two) times daily. Increase to 2 tablets (40mg  in the evening) 01/15/15   Joanna Puff, MD  sennosides-docusate sodium (SENOKOT-S) 8.6-50 MG tablet Take 1 tablet by mouth daily as needed for constipation.    Historical Provider, MD  topiramate (TOPAMAX) 100 MG tablet Take 1.5 tablets (150 mg total) by mouth 2 (two) times daily. 01/13/15    Ardith Dark, MD   There were no vitals taken for this visit. Physical Exam  Constitutional: She is oriented to person, place, and time. She appears well-developed and well-nourished.  HENT:  Head: Normocephalic and atraumatic.  Mouth/Throat: Oropharynx is clear and moist.  Eyes: Conjunctivae are normal. Pupils are equal, round, and reactive to light. Right eye exhibits no discharge. Left eye exhibits no discharge. No scleral icterus.  Neck: Neck supple.  No meningismus or nuchal rigidity  Cardiovascular: Normal rate, regular rhythm and normal heart sounds.   Pulmonary/Chest: Effort normal and breath sounds normal. No respiratory distress. She has no wheezes. She has no rales.  Abdominal: Soft. There is no tenderness.  Musculoskeletal: She exhibits no tenderness.  Neurological: She is alert and oriented to person, place, and time.  Cranial Nerves II-XII grossly intact. Motor strength slightly decreased and right upper extremity. States this is normal for her. Motor strength and other extremities 5/5. Sensation intact to light touch. No ataxia. Gait baseline  Skin: Skin is warm and dry. No rash noted.  Psychiatric: She has a normal mood and affect.  Nursing note and vitals reviewed.   ED Course  Procedures (including critical care time) Labs Review Labs Reviewed - No data to display  Imaging Review No results found. I have personally reviewed and evaluated these images and lab results as part of my medical decision-making.   EKG Interpretation None     Meds given in ED:  Medications  metoCLOPramide (REGLAN) injection 10 mg (10 mg Intravenous Given 07/31/15 1704)  diphenhydrAMINE (BENADRYL) injection 50 mg (50 mg Intravenous Given 07/31/15 1704)  magnesium sulfate IVPB 2 g 50 mL (0 g Intravenous Stopped 07/31/15 1857)  fentaNYL (SUBLIMAZE) injection 100 mcg (100 mcg Intravenous Given 07/31/15 1705)  HYDROmorphone (DILAUDID) injection 1 mg (1 mg Intravenous Given 07/31/15 1900)     New Prescriptions   No medications on file   Filed Vitals:   07/31/15 1745 07/31/15 1815 07/31/15 1830 07/31/15 1900  BP: 150/94 159/87 144/84 140/97  Pulse: 69 68 66 66  Temp:      TempSrc:      Resp: 18 21 16    SpO2: 99% 100% 99% 100%    MDM  Patient with migraine today similar to previous with associated syncope which is also typical for her. She is followed for this problem by one our neurology and I urged her to follow-up with them in the next 2 days for reevaluation.  Pt HA treated and improved while in ED.  Presentation is like pts typical HA and non concerning for [facility], I[facility], Meningitis, or temporal arteritis. Pt is afebrile with no new focal neuro deficits--R extremity weakness resolved, no nuchal rigidity, or change in vision. Pt is to follow up with PCP to discuss prophylactic medication. Pt verbalizes understanding and is agreeable with plan to dc.  Experiences relief with Myofascial  triggerpoint inj performed by Dr. Clydene Pugh.  Prior to patient discharge, I discussed and reviewed this case with Dr.Knott, who also saw and evaluated patient   Final diagnoses:  Other type of nonintractable migraine        Joycie Peek, PA-C 07/31/15 2303  Lyndal Pulley, MD 08/01/15 [phone]

## 2015-07-31 NOTE — ED Provider Notes (Signed)
Medical screening examination/treatment/procedure(s) were conducted as a shared visit with non-physician practitioner(s) and myself.  I personally evaluated the patient during the encounter.   EKG Interpretation   Date/Time:  Thursday July 31 2015 16:29:13 EST Ventricular Rate:  70 PR Interval:  152 QRS Duration: 87 QT Interval:  421 QTC Calculation: 454 R Axis:   29 Text Interpretation:  Sinus rhythm No significant change since last  tracing Confirmed by Kennedee Kitzmiller MD, Rashan Rounsaville [phone]) on 07/31/2015 4:54:32 PM      55 y.o. female presents with typical right-sided headache symptoms and syncopal episode which she has had multiple times previously. Normal neurologic exam with full strength of bilateral upper extremities but has focal tenderness over her right shoulder and neck that appears to be contributing to a tension type headache. Provided local treatment to help with symptoms in the interim and migraine cocktail with some benefit. Plan to follow up with PCP as needed and return precautions discussed for worsening or new concerning symptoms.   Procedure Note: Trigger Point Injection for Myofascial pain  Performed by Dr. Clydene Pugh Indication: muscle/myofascial pain Muscle body and tendon sheath of the right trapezius muscle(s) were injected with 0.5% bupivacaine under sterile technique for release of muscle spasm/pain. Patient tolerated well with immediate improvement of symptoms and no immediate complications following procedure.  CPT Code:   1 or 2 muscle bodies: 20552   See related encounter note   Lyndal Pulley, MD 08/01/15 [phone]

## 2015-07-31 NOTE — ED Notes (Signed)
Per EMS, pt was at school when she started experiencing nausea and had a sudden onset of a migraine. Pt then had a syncopal episode which lasted about 3 minutes. Pt reports hx of the same. EMS gave  of zofran IM for Nausea. Pt alert x4.

## 2015-07-31 NOTE — Discharge Instructions (Signed)
Please follow-up with your neurologist for further evaluation and management of your migraines. Return to ED for new or worsening symptoms.  Migraine Headache A migraine headache is an intense, throbbing pain on one or both sides of your head. A migraine can last for 30 minutes to several hours. CAUSES  The exact cause of a migraine headache is not always known. However, a migraine may be caused when nerves in the brain become irritated and release chemicals that cause inflammation. This causes pain. Certain things may also trigger migraines, such as:  Alcohol.  Smoking.  Stress.  Menstruation.  Aged cheeses.  Foods or drinks that contain nitrates, glutamate, aspartame, or tyramine.  Lack of sleep.  Chocolate.  Caffeine.  Hunger.  Physical exertion.  Fatigue.  Medicines used to treat chest pain (nitroglycerine), birth control pills, estrogen, and some blood pressure medicines. SIGNS AND SYMPTOMS  Pain on one or both sides of your head.  Pulsating or throbbing pain.  Severe pain that prevents daily activities.  Pain that is aggravated by any physical activity.  Nausea, vomiting, or both.  Dizziness.  Pain with exposure to bright lights, loud noises, or activity.  General sensitivity to bright lights, loud noises, or smells. Before you get a migraine, you may get warning signs that a migraine is coming (aura). An aura may include:  Seeing flashing lights.  Seeing bright spots, halos, or zigzag lines.  Having tunnel vision or blurred vision.  Having feelings of numbness or tingling.  Having trouble talking.  Having muscle weakness. DIAGNOSIS  A migraine headache is often diagnosed based on:  Symptoms.  Physical exam.  A CT scan or MRI of your head. These imaging tests cannot diagnose migraines, but they can help rule out other causes of headaches. TREATMENT Medicines may be given for pain and nausea. Medicines can also be given to help prevent  recurrent migraines.  HOME CARE INSTRUCTIONS  Only take over-the-counter or prescription medicines for pain or discomfort as directed by your health care provider. The use of long-term narcotics is not recommended.  Lie down in a dark, quiet room when you have a migraine.  Keep a journal to find out what may trigger your migraine headaches. For example, write down:  What you eat and drink.  How much sleep you get.  Any change to your diet or medicines.  Limit alcohol consumption.  Quit smoking if you smoke.  Get 7-9 hours of sleep, or as recommended by your health care provider.  Limit stress.  Keep lights dim if bright lights bother you and make your migraines worse. SEEK IMMEDIATE MEDICAL CARE IF:   Your migraine becomes severe.  You have a fever.  You have a stiff neck.  You have vision loss.  You have muscular weakness or loss of muscle control.  You start losing your balance or have trouble walking.  You feel faint or pass out.  You have severe symptoms that are different from your first symptoms. MAKE SURE YOU:   Understand these instructions.  Will watch your condition.  Will get help right away if you are not doing well or get worse.   This information is not intended to replace advice given to you by your health care provider. Make sure you discuss any questions you have with your health care provider.   Document Released: 06/07/2005 Document Revised: 06/28/2014 Document Reviewed: 02/12/2013 Elsevier Interactive Patient Education [employer]

## 2015-08-05 ENCOUNTER — Ambulatory Visit (INDEPENDENT_AMBULATORY_CARE_PROVIDER_SITE_OTHER): Payer: Medicaid Other | Admitting: Student

## 2015-08-05 ENCOUNTER — Encounter: Payer: Self-pay | Admitting: Student

## 2015-08-05 VITALS — BP 157/90 | HR 72 | Temp 98.8°F | Wt 217.6 lb

## 2015-08-05 DIAGNOSIS — G43511 Persistent migraine aura without cerebral infarction, intractable, with status migrainosus: Secondary | ICD-10-CM

## 2015-08-05 DIAGNOSIS — G43501 Persistent migraine aura without cerebral infarction, not intractable, with status migrainosus: Secondary | ICD-10-CM | POA: Diagnosis not present

## 2015-08-05 DIAGNOSIS — R29898 Other symptoms and signs involving the musculoskeletal system: Secondary | ICD-10-CM | POA: Diagnosis not present

## 2015-08-05 MED ORDER — PROMETHAZINE HCL 25 MG/ML IJ SOLN
25.0000 mg | Freq: Once | INTRAMUSCULAR | Status: AC
  Administered 2015-08-05: 25 mg via INTRAMUSCULAR

## 2015-08-05 MED ORDER — DEXAMETHASONE SODIUM PHOSPHATE 10 MG/ML IJ SOLN
10.0000 mg | Freq: Once | INTRAMUSCULAR | Status: AC
  Administered 2015-08-05: 10 mg via INTRAMUSCULAR

## 2015-08-05 MED ORDER — KETOROLAC TROMETHAMINE 60 MG/2ML IM SOLN
60.0000 mg | Freq: Once | INTRAMUSCULAR | Status: AC
  Administered 2015-08-05: 60 mg via INTRAMUSCULAR

## 2015-08-05 NOTE — Patient Instructions (Signed)
Follow-up with PCP in 2 weeks If you have worsening headache, nausea, vomiting go to the emergency room for evaluation Please keep your neurology appointment If you have questions or concerns please call the office at [phone]

## 2015-08-05 NOTE — Progress Notes (Signed)
   Subjective:    Patient ID: Kirsten Walsh, female    DOB: [birth year], 55 y.o.   MRN: [id number]   CC: Headache  HPI: 55 year old female with history of migraine headache presenting for headache  Headache - Patient reports that she has had headache every day for the last 13 years - Patient was seen in the emergency room on 2/9 for continued headache most on the right side  and syncopal episode. Pain resolved with myofascial trigger point injection - Since being seen in the emergency room she reports that her headache has continued to worsen, however it is not the worst she has ever had - After going to the emergency room she reports that she 'passed out ' while urinating. She hit her head and chipped her tooth but did not go to the emergency room for this . The tooth has since been fixed by her dentist - She has had syncope in past with urination  - She reports that she has also had nausea and vomiting associated with her headache. This is not new for her  - She has not had vomiting today, but is nauseous. She has been able to tolerate by mouth liquid intake  - Headache is mainly right sided, with occasional radiation down the right side of her neck right shoulder and right arm - She reports weakness in the right arm which is not new for her - She denies changes in vision  Review of Systems ROS Per history of present illness otherwise denies recent illnesses, fevers, nausea, vomiting, diarrhea, chest pain, shortness of breath, abdominal pain, dizziness  Past Medical, Surgical, Social, and Family History Reviewed & Updated per EMR.   Objective:  BP 157/90 mmHg  Pulse 72  Temp(Src) 98.8 F (37.1 C) (Oral)  Wt 217 lb 9.6 oz (98.703 kg) Vitals and nursing note reviewed  General: NAD HEENT: NAD, normal TMs bilaterally, normal oropharynx, normal dentition with no apparent evidence of recent damage or repair Cardiac: RRR Respiratory: CTAB, normal effort Abdomen: soft,  nontender, nondistended. Bowel sounds present Skin: warm and dry, no rashes noted Neuro:  Cranial Nerves II - XII - III, IV, VI - Extraocular movements intact. V - Facial sensation intact bilaterally. VII - Facial movement intact bilaterally. X - Palate elevates symmetrically, no dysarthria. XI - Chin turning  Motor Strength - The patient's strength was 4/5 in RUE, 5/5 in LUE  Motor Tone - Muscle tone was assessed at the neck and appendages and was normal.  Gait and Station - normal    Assessment & Plan:    Intractable migraine with status migrainosus Continue migraine but with some concern for worsening. - We will give Decadron 10 mg IM, Phenergan 25 mg IM, and Toradol 30 mg IM - Patient reports she's got a neurology appointment on 2/20 and has been strongly encouraged to present to this -  ED precautions reviewed -   Right arm weakness Long history of right arm weakness associated with her headaches. Previously improved with myofascial trigger point injection, on 2/9. There is likely a component of her typical headaches - She will follow with neurology     Henery Betzold A. Kennon Rounds MD, MS Family Medicine Resident PGY-2 Pager [phone]

## 2015-08-05 NOTE — Assessment & Plan Note (Signed)
Continue migraine but with some concern for worsening. - We will give Decadron 10 mg IM, Phenergan 25 mg IM, and Toradol 30 mg IM - Patient reports she's got a neurology appointment on 2/20 and has been strongly encouraged to present to this -  ED precautions reviewed -

## 2015-08-05 NOTE — Assessment & Plan Note (Signed)
Long history of right arm weakness associated with her headaches. Previously improved with myofascial trigger point injection, on 2/9. There is likely a component of her typical headaches - She will follow with neurology

## 2015-08-12 ENCOUNTER — Telehealth: Payer: Self-pay | Admitting: Family Medicine

## 2015-08-12 NOTE — Telephone Encounter (Signed)
Patient calling regarding ?side effects she 's experiencing.  Think it my be from her bp medicine, which was changed from lisionpril to hctz.  C/O of lots of nausea

## 2015-08-12 NOTE — Telephone Encounter (Signed)
Called patient informed her to stop the medication and schedule an appointment.  Patient would only like to see PCP.  No appointments available with PCP, is it ok to for her to see you in the same day clinic on Thursday afternoon? Clovis Pu, RN

## 2015-08-12 NOTE — Telephone Encounter (Signed)
Appt 08/14/15 at 1:30 PM.  Patient will call back if she need to reschedule.  Clovis Pu, RN

## 2015-08-12 NOTE — Telephone Encounter (Signed)
Nausea/vomiting is not a common side effect of HCTZ. If she is still having the nausea/vomiting after taking it, she should stop and she needs to schedule an appointment to be seen.  Katina Degree. Jimmey Ralph, MD [facility] Family Medicine Resident PGY-2 08/12/2015 11:45 AM

## 2015-08-12 NOTE — Telephone Encounter (Signed)
It is ok for her to be seen in a same day slot.  Kirsten Walsh. Jimmey Ralph, MD [facility] Family Medicine Resident PGY-2 08/12/2015 11:55 AM

## 2015-08-12 NOTE — Telephone Encounter (Signed)
Return call to pt regarding symptoms.  Patient stated her migraines has been really bad lately and after taking the HCTZ she will vomit.  Patient has tried taking HCTZ with food, however still can not keep medication down.  She has been vomiting for a couple of days and feels very tired and weak.  Patient has prescriptions for n/v.  She took a suppository about 30 minutes prior our conversation.  Advised patient to keep herself hydrated with small sips of water, ginger ale or Gatorade.  She should try saltine crackers.  Patient should also schedule an appointment to be seen.  Patient stated she has an upcoming appointment with neurologist for her migraines.  Will forward to PCP for further advise.  Clovis Pu, RN

## 2015-08-14 ENCOUNTER — Ambulatory Visit: Payer: Medicaid Other | Admitting: Family Medicine

## 2015-08-18 ENCOUNTER — Ambulatory Visit: Payer: Medicaid Other | Admitting: Neurology

## 2015-09-02 ENCOUNTER — Encounter: Payer: Self-pay | Admitting: Neurology

## 2015-09-02 ENCOUNTER — Ambulatory Visit (INDEPENDENT_AMBULATORY_CARE_PROVIDER_SITE_OTHER): Payer: Medicaid Other | Admitting: Neurology

## 2015-09-02 VITALS — BP 134/88 | HR 102 | Ht 65.0 in | Wt 217.0 lb

## 2015-09-02 DIAGNOSIS — IMO0002 Reserved for concepts with insufficient information to code with codable children: Secondary | ICD-10-CM

## 2015-09-02 DIAGNOSIS — G43109 Migraine with aura, not intractable, without status migrainosus: Secondary | ICD-10-CM

## 2015-09-02 DIAGNOSIS — G43709 Chronic migraine without aura, not intractable, without status migrainosus: Secondary | ICD-10-CM

## 2015-09-02 DIAGNOSIS — R51 Headache: Secondary | ICD-10-CM

## 2015-09-02 DIAGNOSIS — R519 Headache, unspecified: Secondary | ICD-10-CM | POA: Insufficient documentation

## 2015-09-02 DIAGNOSIS — R55 Syncope and collapse: Secondary | ICD-10-CM

## 2015-09-02 MED ORDER — VERAPAMIL HCL ER 120 MG PO TBCR: 120.0000 mg | EXTENDED_RELEASE_TABLET | Freq: Every day | ORAL | Status: DC

## 2015-09-02 MED ORDER — TOPIRAMATE 100 MG PO TABS: 150.0000 mg | ORAL_TABLET | Freq: Two times a day (BID) | ORAL | Status: DC

## 2015-09-02 NOTE — Patient Instructions (Addendum)
1. Stop Propranolol 2. Start Verapamil ER 120mg : take 1 tablet at night 3. Continue Topamax 100mg : Take 1 & 1/2 tablets twice a day 4. Refer to Cardiology for recurrent syncope 5. Continue follow-up with Dr. Emilio MathAdagwo at the [facility] 6. As per [place] driving laws, for any episode of loss of consciousness, one should not drive until 6 months event-free 7. Call our office in a month to update on how you are doing, schedule clinic follow-up in 6 months
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
## 2015-09-15 NOTE — Progress Notes (Signed)
Electrophysiology Office Note   Date:  09/16/2015   ID:  Kirsten NiemannConstance M Pfannenstiel, DOB 07/18/1960, MRN [id number]  PCP:  Jacquiline Doealeb Parker, MD  Cardiologist:  Will Jorja LoaMartin Camnitz, MD    Chief Complaint  Patient presents with  . Loss of Consciousness  . New Patient (Initial Visit)     History of Present Illness: Kirsten Walsh is a 55 y.o. female who presents today for electrophysiology evaluation.   She has been having headaches for the last 13 years daily.  She recently went to the ER for a headache that continued to worsen.  After her ER visit, she passed out while urinating. She hit her head and chipped her tooth.  She has had her tooth fixed by a dentist.  She has a history of basilar migraines which has been the working diagnosis as the cause for her syncope. She has passed out 4-5 times in the last 2 months. She says that she does not have an aura beforehand and is asymptomatic prior to passing out. She's had a couple episodes where she's passed out at school. She says that afterwards she does feel palpitations. Today she currently has a headache, and does not feel like she is going to pass out. We did do orthostatics today, and she is feeling quite nauseous after the orthostatics.     Today, she denies symptoms of palpitations, chest pain, shortness of breath, orthopnea, PND, lower extremity edema, claudication, dizziness, presyncope, syncope, bleeding, or neurologic sequela. The patient is tolerating medications without difficulties and is otherwise without complaint today.    Past Medical History  Diagnosis Date  . Hypertension   . Chest pain   . Gall stones   . Migraine     "qd" (02/14/2013)  . Chronic lower back pain     "right side to mid back" (02/14/2013)  . Kidney stone ~ 2006   Past Surgical History  Procedure Laterality Date  . Laparoscopic cholecystectomy  ~ 2004  . Cystoscopy w/ stone manipulation  ~ 2006  . Intrauterine device insertion      "initially put in in  04/2002; changed prn" (02/14/2013)     Current Outpatient Prescriptions  Medication Sig Dispense Refill  . baclofen (LIORESAL) 10 MG tablet Take 1 tablet (10 mg total) by mouth 3 (three) times daily as needed for muscle spasms. 30 each 0  . butalbital-acetaminophen-caffeine (FIORICET, ESGIC) 50-325-40 MG per tablet Take 1 tablet by mouth every 6 (six) hours as needed. For migraine  0  . cloNIDine (CATAPRES) 0.1 MG tablet Take 1 tablet (0.1 mg total) by mouth 2 (two) times daily. 60 tablet 1  . EPIPEN 2-PAK 0.3 MG/0.3ML SOAJ injection INJECT 1 SYRINGE IN THE MUSCLE ONCE 2 Device 0  . ergotamine-caffeine (CAFERGOT) 1-100 MG per tablet Two tablets at onset of attack; then 1 tablet every 30 minutes as needed; maximum: 6 tablets per attack; do not exceed 10 tablets/week (Patient taking differently: Take 2 tablets by mouth every hour as needed for migraine. Two tablets at onset of attack; then 1 tablet every 30 minutes as needed; maximum: 6 tablets per attack; do not exceed 10 tablets/week) 10 tablet 0  . hydrochlorothiazide (HYDRODIURIL) 25 MG tablet Take 1 tablet (25 mg total) by mouth 2 (two) times daily. 60 tablet 0  . ondansetron (ZOFRAN) 4 MG tablet Take 4 mg by mouth every 8 (eight) hours as needed. For nausea/vomiting.  0  . oxyCODONE-acetaminophen (PERCOCET) 5-325 MG tablet Take 1-2 tablets by mouth every  4 (four) hours as needed for moderate pain or severe pain.    . promethazine (PHENERGAN) 25 MG suppository Place 25 mg rectally every 6 (six) hours as needed for nausea or vomiting.    . sennosides-docusate sodium (SENOKOT-S) 8.6-50 MG tablet Take 1 tablet by mouth daily as needed for constipation.    . topiramate (TOPAMAX) 100 MG tablet Take 1.5 tablets (150 mg total) by mouth 2 (two) times daily. 90 tablet 11  . verapamil (CALAN-SR) 120 MG CR tablet Take 1 tablet (120 mg total) by mouth at bedtime. 30 tablet 11   No current facility-administered medications for this visit.    Allergies:    Apple; Aspirin; and Carrot   Social History:  The patient  reports that she has never smoked. She has never used smokeless tobacco. She reports that she drinks alcohol. She reports that she does not use illicit drugs.   Family History:  The patient's family history includes Cancer in her mother; Cirrhosis in her father; Diabetes in her maternal grandmother; Lupus in her sister.    ROS:  Please see the history of present illness.   Otherwise, review of systems is positive for leg swelling, leg pain, snoring, back pain, muscle pain, dizziness, syncope, headaches.   All other systems are reviewed and negative.    PHYSICAL EXAM: VS:  BP 155/100 mmHg  Pulse 72  Ht  (1.651 m)  Wt 218 lb 8 oz (99.111 kg)  BMI 36.36 kg/m2 , BMI Body mass index is 36.36 kg/(m^2).   Orthostatic VS for the past 24 hrs:  BP- Lying Pulse- Lying BP- Sitting Pulse- Sitting BP- Standing at 0 minutes Pulse- Standing at 0 minutes  09/16/15 1435 140/86 mmHg 70 (!) 173/109 mmHg 75 (!) 160/93 mmHg 89   GEN: Well nourished, well developed, in no acute distress HEENT: normal Neck: no JVD, carotid bruits, or masses Cardiac: RRR; no murmurs, rubs, or gallops,no edema  Respiratory:  clear to auscultation bilaterally, normal work of breathing GI: soft, nontender, nondistended, + BS MS: no deformity or atrophy Skin: warm and dry Neuro:  Strength and sensation are intact Psych: euthymic mood, full affect  EKG:  EKG is ordered today. The ekg ordered today shows sinus rhythm  Recent Labs: 07/31/2015: BUN <5*; Creatinine, Ser 0.96; Hemoglobin 13.1; Platelets 191; Potassium 4.4; Sodium 138    Lipid Panel     Component Value Date/Time   CHOL 171 07/16/2008 2041   TRIG 44 07/16/2008 2041   HDL 70 07/16/2008 2041   CHOLHDL 2.4 Ratio 07/16/2008 2041   VLDL 9 07/16/2008 2041   LDLCALC 92 07/16/2008 2041     Wt Readings from Last 3 Encounters:  09/16/15 218 lb 8 oz (99.111 kg)  09/02/15 217 lb (98.431 kg)  08/05/15  217 lb 9.6 oz (98.703 kg)      Other studies Reviewed: Additional studies/ records that were reviewed today include: TTE 09/13/14  Review of the above records today demonstrates:  - Left ventricle: The cavity size was normal. Wall thickness was normal. Systolic function was normal. The estimated ejection fraction was in the range of 55% to 60%. - Mitral valve: There was mild regurgitation.   ASSESSMENT AND PLAN:  1.  Recurrent syncope:  At this point, it is difficult to tell the cause of her recurrent syncope. She is not orthostatic in clinic today, and her blood pressure actually went up when she stood. It is possible that her migraines could be causing her to have syncope,  and they have been worsening over the past few months. Due to the fact that she has had recurrent syncope, and has had palpitations after , we'll fit her with a 30 day monitor to see if we can make a diagnosis of both her syncope and her palpitations.    Current medicines are reviewed at length with the patient today.   The patient does not have concerns regarding her medicines.  The following changes were made today:  none  Labs/ tests ordered today include:  No orders of the defined types were placed in this encounter.     Disposition:   FU with Will Camnitz  6 weeks  Signed, Will Jorja Loa, MD  09/16/2015 2:39 PM     [facility] [address] [place] [address] [phone] (office) [phone] (fax)

## 2015-09-16 ENCOUNTER — Ambulatory Visit (INDEPENDENT_AMBULATORY_CARE_PROVIDER_SITE_OTHER): Payer: Medicaid Other | Admitting: Cardiology

## 2015-09-16 ENCOUNTER — Encounter: Payer: Self-pay | Admitting: Cardiology

## 2015-09-16 ENCOUNTER — Other Ambulatory Visit: Payer: Self-pay | Admitting: Family Medicine

## 2015-09-16 VITALS — BP 155/100 | HR 72 | Ht 65.0 in | Wt 218.5 lb

## 2015-09-16 DIAGNOSIS — R55 Syncope and collapse: Secondary | ICD-10-CM | POA: Diagnosis not present

## 2015-09-16 MED ORDER — BACLOFEN 10 MG PO TABS: 10.0000 mg | ORAL_TABLET | Freq: Three times a day (TID) | ORAL | Status: DC | PRN

## 2015-09-16 NOTE — Patient Instructions (Signed)
Medication Instructions:  Your physician recommends that you continue on your current medications as directed. Please refer to the Current Medication list given to you today.  Labwork: None ordered  Testing/Procedures: Your physician has recommended that you wear an event monitor. Event monitors are medical devices that record the heart's electrical activity. Doctors most often us these monitors to diagnose arrhythmias. Arrhythmias are problems with the speed or rhythm of the heartbeat. The monitor is a small, portable device. You can wear one while you do your normal daily activities. This is usually used to diagnose what is causing palpitations/syncope (passing out).  Follow-Up: Your physician recommends that you schedule a follow-up appointment in: 6 weeks with Dr. Camnitz.   If you need a refill on your cardiac medications before your next appointment, please call your pharmacy.  Thank you for choosing [facility]!!   Irmgard Rampersaud, RN [phone]   Cardiac Event Monitoring A cardiac event monitor is a small recording device used to help detect abnormal heart rhythms (arrhythmias). The monitor is used to record heart rhythm when noticeable symptoms such as the following occur:  Fast heartbeats (palpitations), such as heart racing or fluttering.  Dizziness.  Fainting or light-headedness.  Unexplained weakness. The monitor is wired to two electrodes placed on your chest. Electrodes are flat, sticky disks that attach to your skin. The monitor can be worn for up to 30 days. You will wear the monitor at all times, except when bathing.  HOW TO USE YOUR CARDIAC EVENT MONITOR A technician will prepare your chest for the electrode placement. The technician will show you how to place the electrodes, how to work the monitor, and how to replace the batteries. Take time to practice using the monitor before you leave the office. Make sure you understand how to send the information from  the monitor to your health care provider. This requires a telephone with a landline, not a cell phone. You need to:  Wear your monitor at all times, except when you are in water:  Do not get the monitor wet.  Take the monitor off when bathing. Do not swim or use a hot tub with it on.  Keep your skin clean. Do not put body lotion or moisturizer on your chest.  Change the electrodes daily or any time they stop sticking to your skin. You might need to use tape to keep them on.  It is possible that your skin under the electrodes could become irritated. To keep this from happening, try to put the electrodes in slightly different places on your chest. However, they must remain in the area under your left breast and in the upper right section of your chest.  Make sure the monitor is safely clipped to your clothing or in a location close to your body that your health care provider recommends.  Press the button to record when you feel symptoms of heart trouble, such as dizziness, weakness, light-headedness, palpitations, thumping, shortness of breath, unexplained weakness, or a fluttering or racing heart. The monitor is always on and records what happened slightly before you pressed the button, so do not worry about being too late to get good information.  Keep a diary of your activities, such as walking, doing chores, and taking medicine. It is especially important to note what you were doing when you pushed the button to record your symptoms. This will help your health care provider determine what might be contributing to your symptoms. The information stored in your monitor   will be reviewed by your health care provider alongside your diary entries.  Send the recorded information as recommended by your health care provider. It is important to understand that it will take some time for your health care provider to process the results.  Change the batteries as recommended by your health care  provider. SEEK IMMEDIATE MEDICAL CARE IF:   You have chest pain.  You have extreme difficulty breathing or shortness of breath.  You develop a very fast heartbeat that persists.  You develop dizziness that does not go away.  You faint or constantly feel you are about to faint.   This information is not intended to replace advice given to you by your health care provider. Make sure you discuss any questions you have with your health care provider.   Document Released: 03/16/2008 Document Revised: 06/28/2014 Document Reviewed: 12/04/2012 Elsevier Interactive Patient Education 2016 [employer]   

## 2015-09-16 NOTE — Telephone Encounter (Signed)
Need refill on baclofen.  [facility] [address] [facility] [address]

## 2015-09-24 ENCOUNTER — Emergency Department (HOSPITAL_COMMUNITY)
Admission: EM | Admit: 2015-09-24 | Discharge: 2015-09-24 | Disposition: A | Discharge status: Home / Self Care | Payer: Medicaid Other | Attending: Emergency Medicine | Admitting: Emergency Medicine

## 2015-09-24 ENCOUNTER — Encounter (HOSPITAL_COMMUNITY): Payer: Self-pay | Admitting: Family Medicine

## 2015-09-24 ENCOUNTER — Emergency Department (HOSPITAL_COMMUNITY): Payer: Medicaid Other

## 2015-09-24 DIAGNOSIS — Z87442 Personal history of urinary calculi: Secondary | ICD-10-CM | POA: Diagnosis not present

## 2015-09-24 DIAGNOSIS — Y998 Other external cause status: Secondary | ICD-10-CM | POA: Insufficient documentation

## 2015-09-24 DIAGNOSIS — S0990XA Unspecified injury of head, initial encounter: Secondary | ICD-10-CM | POA: Insufficient documentation

## 2015-09-24 DIAGNOSIS — Y92410 Unspecified street and highway as the place of occurrence of the external cause: Secondary | ICD-10-CM | POA: Diagnosis not present

## 2015-09-24 DIAGNOSIS — G8929 Other chronic pain: Secondary | ICD-10-CM | POA: Diagnosis not present

## 2015-09-24 DIAGNOSIS — Z79899 Other long term (current) drug therapy: Secondary | ICD-10-CM | POA: Diagnosis not present

## 2015-09-24 DIAGNOSIS — M25511 Pain in right shoulder: Secondary | ICD-10-CM

## 2015-09-24 DIAGNOSIS — S4991XA Unspecified injury of right shoulder and upper arm, initial encounter: Secondary | ICD-10-CM | POA: Insufficient documentation

## 2015-09-24 DIAGNOSIS — I10 Essential (primary) hypertension: Secondary | ICD-10-CM | POA: Diagnosis not present

## 2015-09-24 DIAGNOSIS — S199XXA Unspecified injury of neck, initial encounter: Secondary | ICD-10-CM | POA: Diagnosis not present

## 2015-09-24 DIAGNOSIS — Y9389 Activity, other specified: Secondary | ICD-10-CM | POA: Diagnosis not present

## 2015-09-24 DIAGNOSIS — Z8719 Personal history of other diseases of the digestive system: Secondary | ICD-10-CM | POA: Diagnosis not present

## 2015-09-24 DIAGNOSIS — R519 Headache, unspecified: Secondary | ICD-10-CM

## 2015-09-24 DIAGNOSIS — G43909 Migraine, unspecified, not intractable, without status migrainosus: Secondary | ICD-10-CM | POA: Insufficient documentation

## 2015-09-24 DIAGNOSIS — R51 Headache: Secondary | ICD-10-CM

## 2015-09-24 MED ORDER — ACETAMINOPHEN 325 MG PO TABS
650.0000 mg | ORAL_TABLET | Freq: Once | ORAL | Status: DC
  Filled 2015-09-24: qty 2

## 2015-09-24 MED ORDER — ONDANSETRON 4 MG PO TBDP
4.0000 mg | ORAL_TABLET | Freq: Once | ORAL | Status: AC
  Administered 2015-09-24: 4 mg via ORAL
  Filled 2015-09-24: qty 1

## 2015-09-24 MED ORDER — SODIUM CHLORIDE 0.9 % IV BOLUS (SEPSIS)
500.0000 mL | Freq: Once | INTRAVENOUS | Status: AC
  Administered 2015-09-24: 500 mL via INTRAVENOUS

## 2015-09-24 MED ORDER — METOCLOPRAMIDE HCL 5 MG/ML IJ SOLN
10.0000 mg | Freq: Once | INTRAMUSCULAR | Status: AC
  Administered 2015-09-24: 10 mg via INTRAVENOUS
  Filled 2015-09-24: qty 2

## 2015-09-24 MED ORDER — MORPHINE SULFATE (PF) 4 MG/ML IV SOLN
4.0000 mg | Freq: Once | INTRAVENOUS | Status: AC
Start: 2015-09-24 — End: 2015-09-24
  Administered 2015-09-24: 4 mg via INTRAVENOUS
  Filled 2015-09-24: qty 1

## 2015-09-24 MED ORDER — DIPHENHYDRAMINE HCL 50 MG/ML IJ SOLN
25.0000 mg | Freq: Once | INTRAMUSCULAR | Status: AC
  Administered 2015-09-24: 25 mg via INTRAVENOUS
  Filled 2015-09-24: qty 1

## 2015-09-24 MED ORDER — NAPROXEN 250 MG PO TABS: 250.0000 mg | ORAL_TABLET | Freq: Two times a day (BID) | ORAL | Status: DC
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
## 2015-09-24 NOTE — ED Notes (Signed)
Pt to xray

## 2015-09-24 NOTE — ED Notes (Signed)
Pt oob to br with steady gait 
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
## 2015-09-24 NOTE — ED Notes (Signed)
Pt verbalizes understAnding of instructions.
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
## 2015-09-24 NOTE — ED Notes (Signed)
Pt c/o nausea. States this is part of her normal daily migraine.

## 2015-09-26 ENCOUNTER — Ambulatory Visit (HOSPITAL_COMMUNITY)
Admission: RE | Admit: 2015-09-26 | Discharge: 2015-09-26 | Disposition: A | Discharge status: Home / Self Care | Payer: Medicaid Other | Source: Ambulatory Visit | Attending: Family Medicine | Admitting: Family Medicine

## 2015-09-26 ENCOUNTER — Ambulatory Visit (INDEPENDENT_AMBULATORY_CARE_PROVIDER_SITE_OTHER): Payer: Medicaid Other | Admitting: Family Medicine

## 2015-09-26 ENCOUNTER — Other Ambulatory Visit: Payer: Self-pay | Admitting: Family Medicine

## 2015-09-26 ENCOUNTER — Ambulatory Visit (INDEPENDENT_AMBULATORY_CARE_PROVIDER_SITE_OTHER): Payer: Medicaid Other

## 2015-09-26 DIAGNOSIS — M479 Spondylosis, unspecified: Secondary | ICD-10-CM | POA: Insufficient documentation

## 2015-09-26 DIAGNOSIS — I10 Essential (primary) hypertension: Secondary | ICD-10-CM | POA: Diagnosis present

## 2015-09-26 DIAGNOSIS — R55 Syncope and collapse: Secondary | ICD-10-CM | POA: Diagnosis not present

## 2015-09-26 DIAGNOSIS — M419 Scoliosis, unspecified: Secondary | ICD-10-CM | POA: Insufficient documentation

## 2015-09-26 DIAGNOSIS — M549 Dorsalgia, unspecified: Secondary | ICD-10-CM | POA: Insufficient documentation

## 2015-09-26 DIAGNOSIS — M542 Cervicalgia: Secondary | ICD-10-CM | POA: Insufficient documentation

## 2015-09-26 MED ORDER — CYCLOBENZAPRINE HCL 5 MG PO TABS: 5.0000 mg | ORAL_TABLET | Freq: Three times a day (TID) | ORAL | Status: DC | PRN
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
## 2015-09-28 ENCOUNTER — Telehealth: Payer: Self-pay | Admitting: Family Medicine

## 2015-09-28 NOTE — Telephone Encounter (Signed)
Called with x-ray results. No acute fracture or abnormality. It appears most of her symptoms were from muscle spasm in her neck and back. She says that the flexeril is helping with this. Will see her back as needed. She needs to continue to work on BP control.   CGM MD

## 2015-10-05 ENCOUNTER — Telehealth: Payer: Self-pay | Admitting: Neurology

## 2015-10-05 NOTE — Telephone Encounter (Signed)
Records from [facility] reviewed (Dr. Memory ArgueKwadwo Dakwa):  Diagnosis: Headache, Cervicalgia, Pain in joint involving shoulder region, lumbago, disorder of sacrum, pain in limb  VNG and calorics done:  Optokinetics abnormal consistent with CNS involvement or pharmacological influence when neuro-ophthalmic disorder and/or age-related factors are ruled out. Saccades normal. Smooth Pursuit abnormal with results consistent as above.  Calorics: bilateral weakness exists , results consistent with bilaterally reduced labyrinthine reactivity. Though typical of end-organ involvement, CNS or pharmacological influence cannot be ruled out.   ANS diagnostic study: Abnormal responses to autonomic challenges (DB, Valsalva, or standing) suggest autonomic dysfunction. Since only the parasympathetic response during DB is low, mild autonomic dysfunction is possible.  Electrodiagnostic study:  Findings suggesting pathology in right C4 suprascapular nerve, right C6 radial lateral branch, right C8 ulnar nerve, left C8 ulnar nerve Findings suggesting irritation in bilateral C2 greater occupital nerve Left C3 posterior division of the cervical nerve Left T2 second thoracic nerve

## 2015-11-11 ENCOUNTER — Encounter: Payer: Self-pay | Admitting: Cardiology

## 2015-11-11 ENCOUNTER — Ambulatory Visit (INDEPENDENT_AMBULATORY_CARE_PROVIDER_SITE_OTHER): Payer: Medicaid Other | Admitting: Cardiology

## 2015-11-11 VITALS — BP 136/92 | HR 68 | Ht 65.0 in | Wt 216.4 lb

## 2015-11-11 DIAGNOSIS — R55 Syncope and collapse: Secondary | ICD-10-CM | POA: Diagnosis not present

## 2015-11-11 NOTE — Progress Notes (Signed)
Electrophysiology Office Note   Date:  11/11/2015   ID:  Kirsten, Walsh Nov 11, 1960, MRN [id number]  PCP:  Jacquiline Doe, MD  Cardiologist:  Jamesen Stahnke Jorja Loa, MD    Chief Complaint  Patient presents with  . Follow-up    monitor     History of Present Illness: Kirsten Walsh is a 55 y.o. female who presents today for electrophysiology evaluation.   She has been having headaches for the last 13 years daily.  She recently went to the ER for a headache that continued to worsen.  After her ER visit, she passed out while urinating. She hit her head and chipped her tooth.  She has had her tooth fixed by a dentist.  She has a history of basilar migraines which has been the working diagnosis as the cause for her syncope. She has passed out 4-5 times in the last 2 months. She says that she does not have an aura beforehand and is asymptomatic prior to passing out. She's had a couple episodes where she's passed out at school. She says that afterwards she does feel palpitations.    Today, she denies symptoms of palpitations, chest pain, shortness of breath, orthopnea, PND, lower extremity edema, claudication, dizziness, presyncope, syncope, bleeding, or neurologic sequela. The patient is tolerating medications without difficulties and is otherwise without complaint today.    Past Medical History  Diagnosis Date  . Hypertension   . Chest pain   . Gall stones   . Migraine     "qd" (02/14/2013)  . Chronic lower back pain     "right side to mid back" (02/14/2013)  . Kidney stone ~ 2006   Past Surgical History  Procedure Laterality Date  . Laparoscopic cholecystectomy  ~ 2004  . Cystoscopy w/ stone manipulation  ~ 2006  . Intrauterine device insertion      "initially put in in 04/2002; changed prn" (02/14/2013)     Current Outpatient Prescriptions  Medication Sig Dispense Refill  . baclofen (LIORESAL) 10 MG tablet Take 1 tablet (10 mg total) by mouth 3 (three) times daily as  needed for muscle spasms. 30 each 0  . butalbital-acetaminophen-caffeine (FIORICET, ESGIC) 50-325-40 MG per tablet Take 1 tablet by mouth every 6 (six) hours as needed. For migraine  0  . cloNIDine (CATAPRES) 0.1 MG tablet Take 1 tablet (0.1 mg total) by mouth 2 (two) times daily. 60 tablet 1  . cyclobenzaprine (FLEXERIL) 5 MG tablet Take 1 tablet (5 mg total) by mouth 3 (three) times daily as needed for muscle spasms. 30 tablet 0  . EPIPEN 2-PAK 0.3 MG/0.3ML SOAJ injection INJECT 1 SYRINGE IN THE MUSCLE ONCE 2 Device 0  . ergotamine-caffeine (CAFERGOT) 1-100 MG per tablet Two tablets at onset of attack; then 1 tablet every 30 minutes as needed; maximum: 6 tablets per attack; do not exceed 10 tablets/week (Patient taking differently: Take 2 tablets by mouth every hour as needed for migraine. Two tablets at onset of attack; then 1 tablet every 30 minutes as needed; maximum: 6 tablets per attack; do not exceed 10 tablets/week) 10 tablet 0  . hydrochlorothiazide (HYDRODIURIL) 25 MG tablet Take 1 tablet (25 mg total) by mouth 2 (two) times daily. 60 tablet 0  . naproxen (NAPROSYN) 250 MG tablet Take 1 tablet (250 mg total) by mouth 2 (two) times daily with a meal. 30 tablet 0  . ondansetron (ZOFRAN) 4 MG tablet Take 4 mg by mouth every 8 (eight) hours as needed. For  nausea/vomiting.  0  . oxyCODONE-acetaminophen (PERCOCET) 5-325 MG tablet Take 1-2 tablets by mouth every 4 (four) hours as needed for moderate pain or severe pain.    . promethazine (PHENERGAN) 25 MG suppository Place 25 mg rectally every 6 (six) hours as needed for nausea or vomiting.    . sennosides-docusate sodium (SENOKOT-S) 8.6-50 MG tablet Take 1 tablet by mouth daily as needed for constipation.    . topiramate (TOPAMAX) 100 MG tablet Take 1.5 tablets (150 mg total) by mouth 2 (two) times daily. 90 tablet 11  . verapamil (CALAN-SR) 120 MG CR tablet Take 1 tablet (120 mg total) by mouth at bedtime. 30 tablet 11   No current  facility-administered medications for this visit.    Allergies:   Apple; Aspirin; and Carrot   Social History:  The patient  reports that she has never smoked. She has never used smokeless tobacco. She reports that she drinks alcohol. She reports that she does not use illicit drugs.   Family History:  The patient's family history includes Cancer in her mother; Cirrhosis in her father; Diabetes in her maternal grandmother; Lupus in her sister.    ROS:  Please see the history of present illness.   Otherwise, review of systems is positive for leg swelling, nausea, vomiting, headaches, muscle/back pain.   All other systems are reviewed and negative.    PHYSICAL EXAM: VS:  BP 136/92 mmHg  Pulse 68  Ht 5\' 5"  (1.651 m)  Wt 216 lb 6.4 oz (98.158 kg)  BMI 36.01 kg/m2 , BMI Body mass index is 36.01 kg/(m^2).   GEN: Well nourished, well developed, in no acute distress HEENT: normal Neck: no JVD, carotid bruits, or masses Cardiac: RRR; no murmurs, rubs, or gallops,no edema  Respiratory:  clear to auscultation bilaterally, normal work of breathing GI: soft, nontender, nondistended, + BS MS: no deformity or atrophy Skin: warm and dry Neuro:  Strength and sensation are intact Psych: euthymic mood, full affect  EKG:  EKG is ordered today. The ekg ordered today shows sinus rhythm  Recent Labs: 07/31/2015: BUN <5*; Creatinine, Ser 0.96; Hemoglobin 13.1; Platelets 191; Potassium 4.4; Sodium 138    Lipid Panel     Component Value Date/Time   CHOL 171 07/16/2008 2041   TRIG 44 07/16/2008 2041   HDL 70 07/16/2008 2041   CHOLHDL 2.4 Ratio 07/16/2008 2041   VLDL 9 07/16/2008 2041   LDLCALC 92 07/16/2008 2041     Wt Readings from Last 3 Encounters:  11/11/15 216 lb 6.4 oz (98.158 kg)  09/26/15 218 lb 1.6 oz (98.93 kg)  09/16/15 218 lb 8 oz (99.111 kg)      Other studies Reviewed: Additional studies/ records that were reviewed today include: TTE 09/13/14  Review of the above records  today demonstrates:  - Left ventricle: The cavity size was normal. Wall thickness was normal. Systolic function was normal. The estimated ejection fraction was in the range of 55% to 60%. - Mitral valve: There was mild regurgitation.  Telemetry 30 day 09/26/15 Sinus rhythm No pauses for 3 seconds or longer Average HR 70 BPM  ASSESSMENT AND PLAN:  1.  Recurrent syncope:  At this point, it is difficult to tell the cause of her recurrent syncope. She was not orthostatic in clinic today, and her blood pressure actually went up when she stood, during her last visit.  At this point her telemetry monitor did not show any evidence of bradycardia or arrhythmia over 30 days. It is  unclear why she had the episodes of syncope. She is very frustrated, and I did tell her that if she wishes, would monitor for longer with a Linq monitor. She does occasionally feel palpitations after her episodes of syncope, and it is possible that there is an arrhythmogenic cause to her passing out. That being said she does not have any abnormalities on her EKG ordered concerning for arrhythmia at rest. She says that she would like to speak with her neurologist and with her primary physician about the possibility of Linq monitoring.   Current medicines are reviewed at length with the patient today.   The patient does not have concerns regarding her medicines.  The following changes were made today:  none  Labs/ tests ordered today include:  No orders of the defined types were placed in this encounter.     Disposition:   FU with Aidyn Kellis  PRN  Signed, Helvi Royals Jorja Loa, MD without warfarin as needed 11/11/2015 4:24 PM     [facility] [address] [place] [address] [phone] (office) [phone] (fax)

## 2015-12-18 ENCOUNTER — Encounter (HOSPITAL_COMMUNITY): Payer: Self-pay

## 2015-12-18 ENCOUNTER — Emergency Department (HOSPITAL_COMMUNITY)
Admission: EM | Admit: 2015-12-18 | Discharge: 2015-12-18 | Disposition: A | Discharge status: Home / Self Care | Payer: Medicaid Other | Attending: Emergency Medicine | Admitting: Emergency Medicine

## 2015-12-18 DIAGNOSIS — R55 Syncope and collapse: Secondary | ICD-10-CM | POA: Diagnosis present

## 2015-12-18 DIAGNOSIS — Z79899 Other long term (current) drug therapy: Secondary | ICD-10-CM | POA: Diagnosis not present

## 2015-12-18 DIAGNOSIS — G43109 Migraine with aura, not intractable, without status migrainosus: Secondary | ICD-10-CM | POA: Diagnosis not present

## 2015-12-18 DIAGNOSIS — I10 Essential (primary) hypertension: Secondary | ICD-10-CM | POA: Insufficient documentation

## 2015-12-18 DIAGNOSIS — H53149 Visual discomfort, unspecified: Secondary | ICD-10-CM | POA: Insufficient documentation

## 2015-12-18 LAB — CBC WITH DIFFERENTIAL/PLATELET
BASOS ABS: 0 10*3/uL (ref 0.0–0.1)
Basophils Relative: 0 %
EOS ABS: 0.2 10*3/uL (ref 0.0–0.7)
EOS PCT: 4 %
HCT: 44.6 % (ref 36.0–46.0)
Hemoglobin: 14.7 g/dL (ref 12.0–15.0)
Lymphocytes Relative: 27 %
Lymphs Abs: 1.2 10*3/uL (ref 0.7–4.0)
MCH: 30.6 pg (ref 26.0–34.0)
MCHC: 33 g/dL (ref 30.0–36.0)
MCV: 92.7 fL (ref 78.0–100.0)
MONO ABS: 0.3 10*3/uL (ref 0.1–1.0)
Monocytes Relative: 5 %
Neutro Abs: 2.9 10*3/uL (ref 1.7–7.7)
Neutrophils Relative %: 64 %
PLATELETS: 215 10*3/uL (ref 150–400)
RBC: 4.81 MIL/uL (ref 3.87–5.11)
RDW: 13.5 % (ref 11.5–15.5)
WBC: 4.6 10*3/uL (ref 4.0–10.5)

## 2015-12-18 LAB — RAPID URINE DRUG SCREEN, HOSP PERFORMED
AMPHETAMINES: NOT DETECTED
BARBITURATES: NOT DETECTED
BENZODIAZEPINES: NOT DETECTED
COCAINE: NOT DETECTED
Opiates: NOT DETECTED
TETRAHYDROCANNABINOL: NOT DETECTED

## 2015-12-18 LAB — COMPREHENSIVE METABOLIC PANEL
ALK PHOS: 65 U/L (ref 38–126)
ALT: 20 U/L (ref 14–54)
ANION GAP: 7 (ref 5–15)
AST: 21 U/L (ref 15–41)
Albumin: 3.9 g/dL (ref 3.5–5.0)
BILIRUBIN TOTAL: 0.4 mg/dL (ref 0.3–1.2)
BUN: 9 mg/dL (ref 6–20)
CALCIUM: 9.3 mg/dL (ref 8.9–10.3)
CO2: 22 mmol/L (ref 22–32)
Chloride: 110 mmol/L (ref 101–111)
Creatinine, Ser: 1.03 mg/dL — ABNORMAL HIGH (ref 0.44–1.00)
GFR calc non Af Amer: >60 mL/min (ref >60)
GLUCOSE: 112 mg/dL — AB (ref 65–99)
Potassium: 4.1 mmol/L (ref 3.5–5.1)
Sodium: 139 mmol/L (ref 135–145)
Total Protein: 7 g/dL (ref 6.5–8.1)

## 2015-12-18 LAB — TROPONIN I

## 2015-12-18 MED ORDER — VALPROATE SODIUM 500 MG/5ML IV SOLN
500.0000 mg | Freq: Once | INTRAVENOUS | Status: AC
  Administered 2015-12-18: 500 mg via INTRAVENOUS
  Filled 2015-12-18: qty 5

## 2015-12-18 MED ORDER — PROCHLORPERAZINE EDISYLATE 5 MG/ML IJ SOLN
10.0000 mg | Freq: Once | INTRAMUSCULAR | Status: AC
  Administered 2015-12-18: 10 mg via INTRAVENOUS
  Filled 2015-12-18: qty 2

## 2015-12-18 MED ORDER — FENTANYL CITRATE (PF) 100 MCG/2ML IJ SOLN
50.0000 ug | Freq: Once | INTRAMUSCULAR | Status: AC
  Administered 2015-12-18: 50 ug via INTRAVENOUS
  Filled 2015-12-18: qty 2

## 2015-12-18 MED ORDER — SODIUM CHLORIDE 0.9 % IV BOLUS (SEPSIS)
500.0000 mL | Freq: Once | INTRAVENOUS | Status: AC
  Administered 2015-12-18: 500 mL via INTRAVENOUS

## 2015-12-18 NOTE — ED Notes (Signed)
D/C instructions reviewed with patient. Patient denies any questions and is leaving with her friend. Pt. Appears calm and verbalizes understanding of follow up instructions.

## 2015-12-18 NOTE — ED Notes (Signed)
[facility] Witnesses saw what appeared to be a seizure for approx. 3 minutes. Pt. States her head was hurting in the front before the event. EMS reports postictal effects and pt. C/O a migraine type headache with nausea. 4 Zofran IM given on the way here. NO seizure HY but has a history of Migraines.

## 2015-12-18 NOTE — ED Provider Notes (Signed)
CSN: [id number]     Arrival date & time 12/18/15  1119 History   First MD Initiated Contact with Patient 12/18/15 1135     Chief Complaint  Patient presents with  . Seizures      Patient is a 55 y.o. female presenting with seizures. The history is provided by the patient.  Seizures Patient brought in with syncope and possible seizure activity. States she remembers having severe nausea and then later her headache. History of daily headaches. With these headaches she is off and had syncope in the past. There was question of3 minutes of seizure activity. Previous history of syncope with headaches and was thought to be basilar migraines also has had cardiac workup in the past was talkative of further cardiac monitoring. Now complaining of her typical throbbing headache. No urine incontinence. Has had right-sided weakness in the past with some of her migraines.  Past Medical History  Diagnosis Date  . Hypertension   . Chest pain   . Gall stones   . Migraine     "qd" (02/14/2013)  . Chronic lower back pain     "right side to mid back" (02/14/2013)  . Kidney stone ~ 2006   Past Surgical History  Procedure Laterality Date  . Laparoscopic cholecystectomy  ~ 2004  . Cystoscopy w/ stone manipulation  ~ 2006  . Intrauterine device insertion      "initially put in in 04/2002; changed prn" (02/14/2013)   Family History  Problem Relation Age of Onset  . Cancer Mother   . Cirrhosis Father   . Lupus Sister   . Diabetes Maternal Grandmother    Social History  Substance Use Topics  . Smoking status: Never Smoker   . Smokeless tobacco: Never Used  . Alcohol Use: Yes     Comment: 02/14/2013 "socially; couple times/month"   OB History    No data available     Review of Systems  Constitutional: Negative for activity change and appetite change.  Eyes: Negative for pain.  Respiratory: Negative for chest tightness and shortness of breath.   Cardiovascular: Negative for chest pain and leg  swelling.  Gastrointestinal: Positive for nausea and vomiting. Negative for abdominal pain and diarrhea.  Genitourinary: Negative for flank pain.  Musculoskeletal: Negative for back pain and neck stiffness.  Skin: Negative for rash.  Neurological: Positive for seizures and syncope. Negative for weakness, numbness and headaches.  Psychiatric/Behavioral: Negative for behavioral problems.      Allergies  Apple; Aspirin; and Carrot  Home Medications   Prior to Admission medications   Medication Sig Start Date End Date Taking? Authorizing Provider  baclofen (LIORESAL) 10 MG tablet Take 1 tablet (10 mg total) by mouth 3 (three) times daily as needed for muscle spasms. 09/16/15  Yes Ardith Darkaleb M Parker, MD  butalbital-acetaminophen-caffeine (FIORICET, ESGIC) [phone] MG per tablet Take 1 tablet by mouth every 6 (six) hours as needed. For migraine 11/25/14  Yes Historical Provider, MD  cloNIDine (CATAPRES) 0.1 MG tablet Take 1 tablet (0.1 mg total) by mouth 2 (two) times daily. 05/28/15  Yes Ardith Darkaleb M Parker, MD  cyclobenzaprine (FLEXERIL) 5 MG tablet Take 1 tablet (5 mg total) by mouth 3 (three) times daily as needed for muscle spasms. 09/26/15  Yes Yolande Jollyaleb G Melancon, MD  ergotamine-caffeine (CAFERGOT) 1-100 MG per tablet Two tablets at onset of attack; then 1 tablet every 30 minutes as needed; maximum: 6 tablets per attack; do not exceed 10 tablets/week Patient taking differently: Take 2 tablets by mouth  every hour as needed for migraine. Two tablets at onset of attack; then 1 tablet every 30 minutes as needed; maximum: 6 tablets per attack; do not exceed 10 tablets/week 04/19/14  Yes Rolland PorterMark James, MD  hydrochlorothiazide (HYDRODIURIL) 25 MG tablet Take 1 tablet (25 mg total) by mouth 2 (two) times daily. 07/15/15  Yes Arthor CaptainAbigail Harris, PA-C  naproxen (NAPROSYN) 250 MG tablet Take 1 tablet (250 mg total) by mouth 2 (two) times daily with a meal. 09/24/15  Yes Everlene FarrierWilliam Dansie, PA-C  ondansetron (ZOFRAN) 4 MG tablet  Take 4 mg by mouth every 8 (eight) hours as needed. For nausea/vomiting. 10/20/14  Yes Historical Provider, MD  oxyCODONE (OXY IR/ROXICODONE) 5 MG immediate release tablet Take 5 mg by mouth 3 (three) times daily as needed. pain 12/03/15  Yes Historical Provider, MD  promethazine (PHENERGAN) 25 MG suppository Place 25 mg rectally every 6 (six) hours as needed for nausea or vomiting.   Yes Historical Provider, MD  sennosides-docusate sodium (SENOKOT-S) 8.6-50 MG tablet Take 1 tablet by mouth daily as needed for constipation.   Yes Historical Provider, MD  topiramate (TOPAMAX) 100 MG tablet Take 1.5 tablets (150 mg total) by mouth 2 (two) times daily. 09/02/15  Yes Van ClinesKaren M Aquino, MD  verapamil (CALAN-SR) 120 MG CR tablet Take 1 tablet (120 mg total) by mouth at bedtime. 09/02/15  Yes Van ClinesKaren M Aquino, MD  EPIPEN 2-PAK 0.3 MG/0.3ML SOAJ injection INJECT 1 SYRINGE IN THE MUSCLE ONCE 02/03/15   Ardith Darkaleb M Parker, MD   BP 132/94 mmHg  Pulse 63  Temp(Src) 98.8 F (37.1 C) (Oral)  Resp 15  Ht 5\' 5"  (1.651 m)  Wt 218 lb (98.884 kg)  BMI 36.28 kg/m2  SpO2 98% Physical Exam  Constitutional: She appears well-developed.  HENT:  Head: Atraumatic.  Eyes:  Patient with photophobia  Cardiovascular: Normal rate.   Pulmonary/Chest: Effort normal.  Abdominal: Soft.  Neurological: She is alert.  Good grip strength bilaterally.  Skin: Skin is warm.    ED Course  Procedures (including critical care time) Labs Review Labs Reviewed  COMPREHENSIVE METABOLIC PANEL - Abnormal; Notable for the following:    Glucose, Bld 112 (*)    Creatinine, Ser 1.03 (*)    All other components within normal limits  URINE RAPID DRUG SCREEN, HOSP PERFORMED  CBC WITH DIFFERENTIAL/PLATELET  TROPONIN I    Imaging Review No results found. I have personally reviewed and evaluated these images and lab results as part of my medical decision-making.   EKG Interpretation   Date/Time:  Thursday December 18 2015 11:26:08  EDT Ventricular Rate:  86 PR Interval:    QRS Duration: 88 QT Interval:  348 QTC Calculation: 417 R Axis:   7 Text Interpretation:  Sinus rhythm Borderline T abnormalities, anterior  leads Confirmed by Rubin PayorPICKERING  MD, Harrold DonathNATHAN [phone]([id number]) on 12/18/2015 12:59:20 PM      MDM   Final diagnoses:  Basilar migraine  Syncope, unspecified syncope type    Patient with headache and syncope. History same this time is possible seizure activity. Lab works reassuring. Headaches are better after treatment. Has neurology that she sees for this. May need an outpatient EEG with question of seizure activity. Already on driving precautions due to her syncope. Will discharge.    Benjiman CoreNathan Koda Defrank, MD 12/18/15 [phone]

## 2015-12-18 NOTE — ED Notes (Signed)
Pt. Still reporting migraine pain that is unchanged and some nausea. MD notified and coming to talk with patient

## 2015-12-18 NOTE — Discharge Instructions (Signed)
Migraine Headache A migraine headache is an intense, throbbing pain on one or both sides of your head. A migraine can last for 30 minutes to several hours. CAUSES  The exact cause of a migraine headache is not always known. However, a migraine may be caused when nerves in the brain become irritated and release chemicals that cause inflammation. This causes pain. Certain things may also trigger migraines, such as:  Alcohol.  Smoking.  Stress.  Menstruation.  Aged cheeses.  Foods or drinks that contain nitrates, glutamate, aspartame, or tyramine.  Lack of sleep.  Chocolate.  Caffeine.  Hunger.  Physical exertion.  Fatigue.  Medicines used to treat chest pain (nitroglycerine), birth control pills, estrogen, and some blood pressure medicines. SIGNS AND SYMPTOMS  Pain on one or both sides of your head.  Pulsating or throbbing pain.  Severe pain that prevents daily activities.  Pain that is aggravated by any physical activity.  Nausea, vomiting, or both.  Dizziness.  Pain with exposure to bright lights, loud noises, or activity.  General sensitivity to bright lights, loud noises, or smells. Before you get a migraine, you may get warning signs that a migraine is coming (aura). An aura may include:  Seeing flashing lights.  Seeing bright spots, halos, or zigzag lines.  Having tunnel vision or blurred vision.  Having feelings of numbness or tingling.  Having trouble talking.  Having muscle weakness. DIAGNOSIS  A migraine headache is often diagnosed based on:  Symptoms.  Physical exam.  A CT scan or MRI of your head. These imaging tests cannot diagnose migraines, but they can help rule out other causes of headaches. TREATMENT Medicines may be given for pain and nausea. Medicines can also be given to help prevent recurrent migraines.  HOME CARE INSTRUCTIONS  Only take over-the-counter or prescription medicines for pain or discomfort as directed by your  health care provider. The use of long-term narcotics is not recommended.  Lie down in a dark, quiet room when you have a migraine.  Keep a journal to find out what may trigger your migraine headaches. For example, write down:  What you eat and drink.  How much sleep you get.  Any change to your diet or medicines.  Limit alcohol consumption.  Quit smoking if you smoke.  Get 7-9 hours of sleep, or as recommended by your health care provider.  Limit stress.  Keep lights dim if bright lights bother you and make your migraines worse. SEEK IMMEDIATE MEDICAL CARE IF:   Your migraine becomes severe.  You have a fever.  You have a stiff neck.  You have vision loss.  You have muscular weakness or loss of muscle control.  You start losing your balance or have trouble walking.  You feel faint or pass out.  You have severe symptoms that are different from your first symptoms. MAKE SURE YOU:   Understand these instructions.  Will watch your condition.  Will get help right away if you are not doing well or get worse.   This information is not intended to replace advice given to you by your health care provider. Make sure you discuss any questions you have with your health care provider.   Document Released: 06/07/2005 Document Revised: 06/28/2014 Document Reviewed: 02/12/2013 Elsevier Interactive Patient Education 2016 [employer]  Syncope Syncope is a medical term for fainting or passing out. This means you lose consciousness and drop to the ground. People are generally unconscious for less than 5 minutes. You may have some  muscle twitches for up to 15 seconds before waking up and returning to normal. Syncope occurs more often in older adults, but it can happen to anyone. While most causes of syncope are not dangerous, syncope can be a sign of a serious medical problem. It is important to seek medical care.  CAUSES  Syncope is caused by a sudden drop in blood flow to the  brain. The specific cause is often not determined. Factors that can bring on syncope include:  Taking medicines that lower blood pressure.  Sudden changes in posture, such as standing up quickly.  Taking more medicine than prescribed.  Standing in one place for too long.  Seizure disorders.  Dehydration and excessive exposure to heat.  Low blood sugar (hypoglycemia).  Straining to have a bowel movement.  Heart disease, irregular heartbeat, or other circulatory problems.  Fear, emotional distress, seeing blood, or severe pain. SYMPTOMS  Right before fainting, you may:  Feel dizzy or light-headed.  Feel nauseous.  See all white or all black in your field of vision.  Have cold, clammy skin. DIAGNOSIS  Your health care provider will ask about your symptoms, perform a physical exam, and perform an electrocardiogram (ECG) to record the electrical activity of your heart. Your health care provider may also perform other heart or blood tests to determine the cause of your syncope which may include:  Transthoracic echocardiogram (TTE). During echocardiography, sound waves are used to evaluate how blood flows through your heart.  Transesophageal echocardiogram (TEE).  Cardiac monitoring. This allows your health care provider to monitor your heart rate and rhythm in real time.  Holter monitor. This is a portable device that records your heartbeat and can help diagnose heart arrhythmias. It allows your health care provider to track your heart activity for several days, if needed.  Stress tests by exercise or by giving medicine that makes the heart beat faster. TREATMENT  In most cases, no treatment is needed. Depending on the cause of your syncope, your health care provider may recommend changing or stopping some of your medicines. HOME CARE INSTRUCTIONS  Have someone stay with you until you feel stable.  Do not drive, use machinery, or play sports until your health care provider  says it is okay.  Keep all follow-up appointments as directed by your health care provider.  Lie down right away if you start feeling like you might faint. Breathe deeply and steadily. Wait until all the symptoms have passed.  Drink enough fluids to keep your urine clear or pale yellow.  If you are taking blood pressure or heart medicine, get up slowly and take several minutes to sit and then stand. This can reduce dizziness. SEEK IMMEDIATE MEDICAL CARE IF:   You have a severe headache.  You have unusual pain in the chest, abdomen, or back.  You are bleeding from your mouth or rectum, or you have black or tarry stool.  You have an irregular or very fast heartbeat.  You have pain with breathing.  You have repeated fainting or seizure-like jerking during an episode.  You faint when sitting or lying down.  You have confusion.  You have trouble walking.  You have severe weakness.  You have vision problems. If you fainted, call your local emergency services (911 in [place] Do not drive yourself to the hospital.    This information is not intended to replace advice given to you by your health care provider. Make sure you discuss any questions you have with your  health care provider.   Document Released: 06/07/2005 Document Revised: 10/22/2014 Document Reviewed: 08/06/2011 Elsevier Interactive Patient Education [employer].

## 2016-02-04 ENCOUNTER — Other Ambulatory Visit: Payer: Self-pay | Admitting: Family Medicine

## 2016-02-05 NOTE — Telephone Encounter (Signed)
Rx filled.  [name], MD [facility] Resident PGY-3 02/05/2016 7:43 AM

## 2016-02-11 ENCOUNTER — Other Ambulatory Visit: Payer: Self-pay | Admitting: Family Medicine

## 2016-02-11 MED ORDER — HYDROCHLOROTHIAZIDE 25 MG PO TABS: 25.0000 mg | ORAL_TABLET | Freq: Two times a day (BID) | ORAL | 5 refills | Status: DC

## 2016-02-11 NOTE — Telephone Encounter (Signed)
Needs refill on HCTZ.  [facility] [address] [facility] appt  02-16-16

## 2016-02-17 ENCOUNTER — Ambulatory Visit (INDEPENDENT_AMBULATORY_CARE_PROVIDER_SITE_OTHER): Payer: Medicaid Other | Admitting: Family Medicine

## 2016-02-17 ENCOUNTER — Encounter: Payer: Self-pay | Admitting: Family Medicine

## 2016-02-17 VITALS — BP 140/90 | HR 71 | Temp 98.9°F | Wt 218.0 lb

## 2016-02-17 DIAGNOSIS — G43709 Chronic migraine without aura, not intractable, without status migrainosus: Secondary | ICD-10-CM

## 2016-02-17 DIAGNOSIS — I10 Essential (primary) hypertension: Secondary | ICD-10-CM

## 2016-02-17 DIAGNOSIS — IMO0002 Reserved for concepts with insufficient information to code with codable children: Secondary | ICD-10-CM

## 2016-02-17 MED ORDER — PROPRANOLOL HCL ER 80 MG PO CP24: 80.0000 mg | ORAL_CAPSULE | Freq: Every day | ORAL | 3 refills | Status: DC

## 2016-02-17 MED ORDER — DEXAMETHASONE SODIUM PHOSPHATE 100 MG/10ML IJ SOLN
10.0000 mg | Freq: Once | INTRAMUSCULAR | Status: AC
  Administered 2016-02-17: 10 mg via INTRAMUSCULAR

## 2016-02-17 MED ORDER — KETOROLAC TROMETHAMINE 60 MG/2ML IM SOLN
60.0000 mg | Freq: Once | INTRAMUSCULAR | Status: AC
  Administered 2016-02-17: 60 mg via INTRAMUSCULAR

## 2016-02-17 MED ORDER — PROMETHAZINE HCL 25 MG/ML IJ SOLN
25.0000 mg | Freq: Once | INTRAMUSCULAR | Status: AC
  Administered 2016-02-17: 25 mg via INTRAMUSCULAR

## 2016-02-17 MED ORDER — PROMETHAZINE HCL 25 MG RE SUPP: 25.0000 mg | Freq: Four times a day (QID) | RECTAL | 1 refills | Status: DC | PRN

## 2016-02-17 NOTE — Patient Instructions (Signed)
We will start you on propranolol again today.  We will give you a migraine cocktail and refill your phenergan.  Please come back in a few weeks to recheck your blood pressure.  Take care,  Dr Jimmey RalphParker

## 2016-02-17 NOTE — Assessment & Plan Note (Signed)
Migraine cocktail of phenergan, toradol, and decadron given today. Current migraine consistent with typical symptoms. No red flag signs or symptoms. Will start propranolol today. Continue clonidine, topiramate, and verapamil. Follow up in 2-3 weeks. May need addition of triptan.

## 2016-02-17 NOTE — Progress Notes (Signed)
   Subjective:  Kirsten Walsh is a 55 y.o. female who [facility] today with a chief complaint of blood pressure and migraines.   HPI:  Hypertension BP Readings from Last 3 Encounters:  02/17/16 140/90  12/18/15 137/86  11/11/15 (!) 136/92   Home BP monitoring-No Compliant with medications-yes, without side effects ROS-Denies any CP, SOB, LE edema, transient weakness, orthopnea, PND.   Migraine Patient also currently has a migraine that has lasted for over the last week. Pain is consistent with her typical headache and is located in a frontal distribution. She has some photophobia. Has had some nausea and vomiting. Also has some numbness in her toes and fingers that is consistent with her prior migraines. She has taken naproxen which has not helped significantly.   ROS: Per HPI  PMH: Smoking history reviewed.    Objective:  Physical Exam: BP 140/90   Pulse 71   Temp 98.9 F (37.2 C) (Oral)   Wt 218 lb (98.9 kg)   SpO2 100%   BMI 36.28 kg/m   Gen: NAD, resting comfortably CV: RRR with no murmurs appreciated Pulm: NWOB, CTAB with no crackles, wheezes, or rhonchi GI: Normal bowel sounds present. Soft, Nontender, Nondistended. MSK: no edema, cyanosis, or clubbing noted Skin: warm, dry Neuro: CN2-12 intact. Strength 5/5 in upper and lower extremities. Sensation intact grossly.  Psych: Normal affect and thought content  Assessment/Plan:  HYPERTENSION, BENIGN SYSTEMIC Elevated today. Will start propanol as it will also hopefully help reduce severity and frequency of her headaches. Continue HCTZ, clonidine, and verapamil. Follow up in 2-3 weeks.   Chronic migraine Migraine cocktail of phenergan, toradol, and decadron given today. Current migraine consistent with typical symptoms. No red flag signs or symptoms. Will start propranolol today. Continue clonidine, topiramate, and verapamil. Follow up in 2-3 weeks. May need addition of triptan.    [name],  MD [facility] Resident PGY-3 02/17/2016 2:09 PM

## 2016-02-17 NOTE — Assessment & Plan Note (Addendum)
Elevated today. Will start propanol as it will also hopefully help reduce severity and frequency of her headaches. Continue HCTZ, clonidine, and verapamil. Follow up in 2-3 weeks.

## 2016-03-03 ENCOUNTER — Ambulatory Visit: Payer: Medicaid Other | Admitting: Neurology

## 2016-03-15 ENCOUNTER — Encounter: Payer: Self-pay | Admitting: Neurology

## 2016-03-15 ENCOUNTER — Telehealth: Payer: Self-pay | Admitting: Neurology

## 2016-03-16 ENCOUNTER — Telehealth: Payer: Self-pay | Admitting: Neurology

## 2016-03-16 NOTE — Telephone Encounter (Signed)
Patient dismissed from [facility] by Patrcia DollyKaren Aquino MD , effective March 15, 2016. Dismissal letter sent out by certified / registered mail.  DAJ

## 2016-03-24 ENCOUNTER — Encounter (HOSPITAL_COMMUNITY): Payer: Self-pay

## 2016-03-24 ENCOUNTER — Emergency Department (HOSPITAL_COMMUNITY)
Admission: EM | Admit: 2016-03-24 | Discharge: 2016-03-24 | Disposition: A | Discharge status: Home / Self Care | Payer: Medicaid Other | Attending: Emergency Medicine | Admitting: Emergency Medicine

## 2016-03-24 DIAGNOSIS — G43909 Migraine, unspecified, not intractable, without status migrainosus: Secondary | ICD-10-CM | POA: Insufficient documentation

## 2016-03-24 DIAGNOSIS — I10 Essential (primary) hypertension: Secondary | ICD-10-CM | POA: Insufficient documentation

## 2016-03-24 DIAGNOSIS — M62838 Other muscle spasm: Secondary | ICD-10-CM | POA: Insufficient documentation

## 2016-03-24 LAB — I-STAT CHEM 8, ED
BUN: 6 mg/dL (ref 6–20)
CREATININE: 0.8 mg/dL (ref 0.44–1.00)
Calcium, Ion: 1.16 mmol/L (ref 1.15–1.40)
Chloride: 107 mmol/L (ref 101–111)
Glucose, Bld: 98 mg/dL (ref 65–99)
HEMATOCRIT: 41 % (ref 36.0–46.0)
Hemoglobin: 13.9 g/dL (ref 12.0–15.0)
POTASSIUM: 3.3 mmol/L — AB (ref 3.5–5.1)
Sodium: 144 mmol/L (ref 135–145)
TCO2: 22 mmol/L (ref 0–100)

## 2016-03-24 LAB — I-STAT BETA HCG BLOOD, ED (MC, WL, AP ONLY): I-stat hCG, quantitative: <5 m[IU]/mL (ref <5)

## 2016-03-24 LAB — CBG MONITORING, ED: GLUCOSE-CAPILLARY: 104 mg/dL — AB (ref 65–99)

## 2016-03-24 MED ORDER — KETOROLAC TROMETHAMINE 30 MG/ML IJ SOLN
30.0000 mg | Freq: Once | INTRAMUSCULAR | Status: AC
  Administered 2016-03-24: 30 mg via INTRAVENOUS
  Filled 2016-03-24: qty 1

## 2016-03-24 MED ORDER — METOCLOPRAMIDE HCL 5 MG/ML IJ SOLN
10.0000 mg | Freq: Once | INTRAMUSCULAR | Status: AC
  Administered 2016-03-24: 10 mg via INTRAVENOUS
  Filled 2016-03-24: qty 2

## 2016-03-24 MED ORDER — DIPHENHYDRAMINE HCL 50 MG/ML IJ SOLN
25.0000 mg | Freq: Once | INTRAMUSCULAR | Status: AC
Start: 2016-03-24 — End: 2016-03-24
  Administered 2016-03-24: 25 mg via INTRAVENOUS
  Filled 2016-03-24: qty 1

## 2016-03-24 MED ORDER — MAGNESIUM SULFATE 2 GM/50ML IV SOLN
2.0000 g | Freq: Once | INTRAVENOUS | Status: AC
  Administered 2016-03-24: 2 g via INTRAVENOUS
  Filled 2016-03-24: qty 50

## 2016-03-24 MED ORDER — SODIUM CHLORIDE 0.9 % IV BOLUS (SEPSIS)
1000.0000 mL | Freq: Once | INTRAVENOUS | Status: AC
  Administered 2016-03-24: 1000 mL via INTRAVENOUS

## 2016-03-24 MED ORDER — DEXAMETHASONE SODIUM PHOSPHATE 10 MG/ML IJ SOLN
10.0000 mg | Freq: Once | INTRAMUSCULAR | Status: AC
  Administered 2016-03-24: 10 mg via INTRAVENOUS
  Filled 2016-03-24: qty 1

## 2016-03-24 MED ORDER — HALOPERIDOL LACTATE 5 MG/ML IJ SOLN
10.0000 mg | Freq: Once | INTRAMUSCULAR | Status: AC
  Administered 2016-03-24: 10 mg via INTRAVENOUS
  Filled 2016-03-24: qty 2

## 2016-03-24 MED ORDER — POTASSIUM CHLORIDE CRYS ER 20 MEQ PO TBCR
20.0000 meq | EXTENDED_RELEASE_TABLET | Freq: Once | ORAL | Status: AC
  Administered 2016-03-24: 20 meq via ORAL
  Filled 2016-03-24: qty 1

## 2016-03-24 NOTE — ED Notes (Signed)
The pt is c/o a headache still  Med hung

## 2016-03-24 NOTE — Discharge Instructions (Signed)
Read the information below.  You may return to the Emergency Department at any time for worsening condition or any new symptoms that concern you. °

## 2016-03-24 NOTE — ED Notes (Signed)
Pt provided work note. Will contact son for transportation at discharge

## 2016-03-24 NOTE — ED Provider Notes (Signed)
[facility] Provider Note   CSN: [id number] Arrival date & time: 03/24/16  1117     History   Chief Complaint Chief Complaint  Patient presents with  . Migraine    HPI Kirsten Walsh is a 55 y.o. female.  HPI   Patient with hx migraine, chronic pain p/w right sided migraine headache that has been gradually worsening x 2 weeks.  The pain is on the right side of her head, including her right ear, radiates into her right neck and shoulder.  Associated nausea and vomiting, sensitivity to light and sound, also has silver specks in her visual field.  All of these things are typical of her chronic migraines.  Has taken all of her home migraine medications without improvement.   Denies fevers, recent illness, head injury, "worst" headache of her life.  Pt also notes she was brought to the ED today after syncopal episode.  States she had a wave of nausea due to the migraine and was trying to get to the bathroom.  She became very lightheaded and was lowered to the ground by the person who was accompanying her.  She then notes she saw people around her, thinks she lost consciousness at some point.  A witness to the event states that the patient was trembling but her eyes were open and she seemed to be awake as soon as she got to her, so if she did pass out it was momentary.     Past Medical History:  Diagnosis Date  . Chest pain   . Chronic lower back pain    "right side to mid back" (02/14/2013)  . Gall stones   . Hypertension   . Kidney stone ~ 2006  . Migraine    "qd" (02/14/2013)    Patient Active Problem List   Diagnosis Date Noted  . Chronic daily headache 09/02/2015  . 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 Faintness 09/02/2015  . Right arm weakness 08/05/2015  . Edema of right arm and right leg 09/07/2014  . IUD contraception 07/18/2014  . History of anaphylaxis 06/26/2014  . Intractable migraine with status migrainosus 06/10/2014  . Intractable migraine without aura and  with status migrainosus   . Bradycardia 02/18/2013  . Cardiomyopathy 10/20/2011  . Decreased ejection fraction 10/05/2011  . Leg swelling 09/23/2011  . Complex regional pain syndrome 05/03/2011  . Chronic pain 03/29/2011  . NECK PAIN, RIGHT 02/03/2010  . ALLERGIC RHINITIS 01/30/2009  . DIZZINESS 07/16/2008  . Chronic migraine 04/26/2007  . OBESITY, NOS 08/18/2006  . HYPERTENSION, BENIGN SYSTEMIC 08/18/2006    Past Surgical History:  Procedure Laterality Date  . CYSTOSCOPY W/ STONE MANIPULATION  ~ 2006  . INTRAUTERINE DEVICE INSERTION     "initially put in in 04/2002; changed prn" (02/14/2013)  . LAPAROSCOPIC CHOLECYSTECTOMY  ~ 2004    OB History    No data available       Home Medications    Prior to Admission medications   Medication Sig Start Date End Date Taking? Authorizing Provider  baclofen (LIORESAL) 10 MG tablet Take 1 tablet (10 mg total) by mouth 3 (three) times daily as needed for muscle spasms. 09/16/15   Ardith Dark, MD  butalbital-acetaminophen-caffeine (FIORICET, ESGIC) [phone] MG per tablet Take 1 tablet by mouth every 6 (six) hours as needed. For migraine 11/25/14   Historical Provider, MD  cloNIDine (CATAPRES) 0.1 MG tablet Take 1 tablet (0.1 mg total) by mouth 2 (two) times daily. 05/28/15   Ardith Dark,  MD  cyclobenzaprine (FLEXERIL) 5 MG tablet Take 1 tablet (5 mg total) by mouth 3 (three) times daily as needed for muscle spasms. 09/26/15   Yolande Jollyaleb G Melancon, MD  EPIPEN 2-PAK 0.3 MG/0.3ML SOAJ injection INJECT 1 SYRINGE IN THE MUSCLE ONCE 02/03/15   Ardith Darkaleb M Parker, MD  ergotamine-caffeine (CAFERGOT) 1-100 MG per tablet Two tablets at onset of attack; then 1 tablet every 30 minutes as needed; maximum: 6 tablets per attack; do not exceed 10 tablets/week Patient taking differently: Take 2 tablets by mouth every hour as needed for migraine. Two tablets at onset of attack; then 1 tablet every 30 minutes as needed; maximum: 6 tablets per attack; do not exceed 10  tablets/week 04/19/14   Rolland PorterMark James, MD  hydrochlorothiazide (HYDRODIURIL) 25 MG tablet Take 1 tablet (25 mg total) by mouth 2 (two) times daily. 02/11/16   Ardith Darkaleb M Parker, MD  naproxen (NAPROSYN) 250 MG tablet Take 1 tablet (250 mg total) by mouth 2 (two) times daily with a meal. 09/24/15   Everlene FarrierWilliam Dansie, PA-C  ondansetron (ZOFRAN) 4 MG tablet Take 4 mg by mouth every 8 (eight) hours as needed. For nausea/vomiting. 10/20/14   Historical Provider, MD  oxyCODONE (OXY IR/ROXICODONE) 5 MG immediate release tablet Take 5 mg by mouth 3 (three) times daily as needed. pain 12/03/15   Historical Provider, MD  promethazine (PHENERGAN) 25 MG suppository Place 1 suppository (25 mg total) rectally every 6 (six) hours as needed for nausea or vomiting. 02/17/16   Ardith Darkaleb M Parker, MD  propranolol ER (INDERAL LA) 80 MG 24 hr capsule Take 1 capsule (80 mg total) by mouth daily. 02/17/16   Ardith Darkaleb M Parker, MD  sennosides-docusate sodium (SENOKOT-S) 8.6-50 MG tablet Take 1 tablet by mouth daily as needed for constipation.    Historical Provider, MD  topiramate (TOPAMAX) 100 MG tablet Take 1.5 tablets (150 mg total) by mouth 2 (two) times daily. 09/02/15   Van ClinesKaren M Aquino, MD  verapamil (CALAN-SR) 120 MG CR tablet Take 1 tablet (120 mg total) by mouth at bedtime. 09/02/15   Van ClinesKaren M Aquino, MD    Family History Family History  Problem Relation Age of Onset  . Cancer Mother   . Cirrhosis Father   . Lupus Sister   . Diabetes Maternal Grandmother     Social History Social History  Substance Use Topics  . Smoking status: Never Smoker  . Smokeless tobacco: Never Used  . Alcohol use Yes     Comment: 02/14/2013 "socially; couple times/month"     Allergies   Apple; Aspirin; and Carrot [daucus carota]   Review of Systems Review of Systems  All other systems reviewed and are negative.    Physical Exam Updated Vital Signs BP 111/68 (BP Location: Right Arm)   Pulse 70   Temp 98 F (36.7 C)   Resp 20   Ht 5\' 5"   (1.651 m)   Wt 94.3 kg   SpO2 99%   BMI 34.61 kg/m   Physical Exam  Constitutional: She appears well-developed and well-nourished. No distress.  Trembling/shaking  HENT:  Head: Normocephalic and atraumatic.  Right Ear: Tympanic membrane and ear canal normal.  Neck: Neck supple.  Cardiovascular: Normal rate and regular rhythm.   Pulmonary/Chest: Effort normal and breath sounds normal. No respiratory distress. She has no wheezes. She has no rales.  Abdominal: Soft. She exhibits no distension. There is no tenderness. There is no rebound and no guarding.  Musculoskeletal:       Cervical  back: She exhibits spasm. She exhibits normal range of motion, no bony tenderness and no deformity.       Back:  Neurological: She is alert.  Chronic weakness of right arm and leg that pt states is unchanged.  Left arm and leg 5/5 strength.    Skin: She is not diaphoretic.  Nursing note and vitals reviewed.    ED Treatments / Results  Labs (all labs ordered are listed, but only abnormal results are displayed) Labs Reviewed  I-STAT CHEM 8, ED - Abnormal; Notable for the following:       Result Value   Potassium 3.3 (*)    All other components within normal limits  CBG MONITORING, ED - Abnormal; Notable for the following:    Glucose-Capillary 104 (*)    All other components within normal limits  I-STAT BETA HCG BLOOD, ED ([facility], [facility], [facility] ONLY)    EKG  EKG Interpretation None       Radiology No results found.  Procedures Procedures (including critical care time)  Medications Ordered in ED Medications  ketorolac (TORADOL) 30 MG/ML injection 30 mg (30 mg Intravenous Given 03/24/16 1342)  diphenhydrAMINE (BENADRYL) injection 25 mg (25 mg Intravenous Given 03/24/16 1342)  metoCLOPramide (REGLAN) injection 10 mg (10 mg Intravenous Given 03/24/16 1342)  sodium chloride 0.9 % bolus 1,000 mL (0 mLs Intravenous Stopped 03/24/16 1707)  dexamethasone (DECADRON) injection 10 mg (10 mg Intravenous Given  03/24/16 1501)  magnesium sulfate IVPB 2 g 50 mL (0 g Intravenous Stopped 03/24/16 1707)  haloperidol lactate (HALDOL) injection 10 mg (10 mg Intravenous Given 03/24/16 1810)  potassium chloride SA (K-DUR,KLOR-CON) CR tablet 20 mEq (20 mEq Oral Given 03/24/16 1914)     Initial Impression / Assessment and Plan / ED Course  I have reviewed the triage vital signs and the nursing notes.  Pertinent labs & imaging results that were available during my care of the patient were reviewed by me and considered in my medical decision making (see chart for details).  Clinical Course  Comment By Time  Pt reports nausea is resolved, headache is improving.   Trixie Dredge, PA-C 10/04 1542  Pt feeling improvement following haldol IV Trixie Dredge, PA-C 10/04 1904   Afebrile, nontoxic patient with typical migraine  headache.  No red flags including head trauma, fevers, meningeal signs, focal neurologic deficits.  Migraine cocktail given + magnesium and haldol with improvement.  Pt also c/o syncopal episode that was preceded by lightheadedness.  Per chart review this is typical of her migraines, pt has basilar migraines.  EKG unremarkable.  HCG negative.  Chem 8 unremarkable.    D/C home with PCP, neuro follow up.  Discussed result, findings, treatment, and follow up  with patient.  Pt given return precautions.  Pt verbalizes understanding and agrees with plan.     Final Clinical Impressions(s) / ED Diagnoses   Final diagnoses:  Migraine without status migrainosus, not intractable, unspecified migraine type    New Prescriptions Discharge Medication List as of 03/24/2016  7:06 PM       Trixie Dredge, PA-C 03/24/16 2052    Geoffery Lyons, MD 03/25/16 [phone]

## 2016-03-24 NOTE — ED Triage Notes (Signed)
Pt presents with 2 week h/o R temporal headache.  Pt reports h/o migraines with pain worsening, topamax not giving relief.  +nausea and photophobia

## 2016-03-24 NOTE — ED Notes (Signed)
Mag sulfate infused the pt does not feel like her headache is any better

## 2016-03-25 ENCOUNTER — Emergency Department (HOSPITAL_COMMUNITY)
Admission: EM | Admit: 2016-03-25 | Discharge: 2016-03-25 | Disposition: A | Discharge status: Home / Self Care | Payer: Medicaid Other | Attending: Emergency Medicine | Admitting: Emergency Medicine

## 2016-03-25 ENCOUNTER — Encounter (HOSPITAL_COMMUNITY): Payer: Self-pay | Admitting: Emergency Medicine

## 2016-03-25 DIAGNOSIS — Z79899 Other long term (current) drug therapy: Secondary | ICD-10-CM | POA: Insufficient documentation

## 2016-03-25 DIAGNOSIS — T43505A Adverse effect of unspecified antipsychotics and neuroleptics, initial encounter: Secondary | ICD-10-CM | POA: Diagnosis not present

## 2016-03-25 DIAGNOSIS — G2402 Drug induced acute dystonia: Secondary | ICD-10-CM | POA: Diagnosis not present

## 2016-03-25 DIAGNOSIS — I10 Essential (primary) hypertension: Secondary | ICD-10-CM | POA: Insufficient documentation

## 2016-03-25 MED ORDER — DIPHENHYDRAMINE HCL 25 MG PO TABS: 25.0000 mg | ORAL_TABLET | Freq: Four times a day (QID) | ORAL | 0 refills | Status: DC

## 2016-03-25 MED ORDER — BENZTROPINE MESYLATE 1 MG/ML IJ SOLN
1.0000 mg | Freq: Once | INTRAMUSCULAR | Status: AC
  Administered 2016-03-25: 1 mg via INTRAVENOUS
  Filled 2016-03-25: qty 2

## 2016-03-25 MED ORDER — DIPHENHYDRAMINE HCL 50 MG/ML IJ SOLN
50.0000 mg | Freq: Once | INTRAMUSCULAR | Status: AC
  Administered 2016-03-25: 50 mg via INTRAVENOUS
  Filled 2016-03-25: qty 1

## 2016-03-25 NOTE — ED Notes (Signed)
Pt showing NAD. RR even and unlabored. Family at bedside. Voices no questions/concerns at this time. 

## 2016-03-25 NOTE — ED Triage Notes (Signed)
Pt reports seen here yesterday, given Haldol. Pt reports went home and went to bed. Woke up this morning with tongue swelling. Pt reports in addition to tongue swelling also has trouble swallowing.

## 2016-03-25 NOTE — ED Provider Notes (Signed)
[facility] Provider Note   CSN: [id number] Arrival date & time: 03/25/16  1834     History   Chief Complaint Chief Complaint  Patient presents with  . Allergic Reaction    HPI Kirsten Walsh is a 55 y.o. female.  HPI Patient reports she feels like her tongue is swollen and stiff. She reports it kind of hurts in the back of her throat. Patient reports also it feels like her jaw is tight and trembling. Patient was treated for migraine headaches in the emergency department yesterday. She reports that she received Haldol which is not part of her usual migraine medications. She thinks she may be having an allergic reaction to it. He does not have any rash or itching of the remainder of her skin. Associated nausea or vomiting. Past Medical History:  Diagnosis Date  . Chest pain   . Chronic lower back pain    "right side to mid back" (02/14/2013)  . Gall stones   . Hypertension   . Kidney stone ~ 2006  . Migraine    "qd" (02/14/2013)    Patient Active Problem List   Diagnosis Date Noted  . Chronic daily headache 09/02/2015  . 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 Faintness 09/02/2015  . Right arm weakness 08/05/2015  . Edema of right arm and right leg 09/07/2014  . IUD contraception 07/18/2014  . History of anaphylaxis 06/26/2014  . Intractable migraine with status migrainosus 06/10/2014  . Intractable migraine without aura and with status migrainosus   . Bradycardia 02/18/2013  . Cardiomyopathy 10/20/2011  . Decreased ejection fraction 10/05/2011  . Leg swelling 09/23/2011  . Complex regional pain syndrome 05/03/2011  . Chronic pain 03/29/2011  . NECK PAIN, RIGHT 02/03/2010  . ALLERGIC RHINITIS 01/30/2009  . DIZZINESS 07/16/2008  . Chronic migraine 04/26/2007  . OBESITY, NOS 08/18/2006  . HYPERTENSION, BENIGN SYSTEMIC 08/18/2006    Past Surgical History:  Procedure Laterality Date  . CYSTOSCOPY W/ STONE MANIPULATION  ~ 2006  . INTRAUTERINE DEVICE INSERTION       "initially put in in 04/2002; changed prn" (02/14/2013)  . LAPAROSCOPIC CHOLECYSTECTOMY  ~ 2004    OB History    No data available       Home Medications    Prior to Admission medications   Medication Sig Start Date End Date Taking? Authorizing Provider  baclofen (LIORESAL) 10 MG tablet Take 1 tablet (10 mg total) by mouth 3 (three) times daily as needed for muscle spasms. 09/16/15  Yes Ardith Dark, MD  butalbital-acetaminophen-caffeine (FIORICET, ESGIC) [phone] MG per tablet Take 1 tablet by mouth every 6 (six) hours as needed. For migraine 11/25/14  Yes Historical Provider, MD  cloNIDine (CATAPRES) 0.1 MG tablet Take 1 tablet (0.1 mg total) by mouth 2 (two) times daily. 05/28/15  Yes Ardith Dark, MD  hydrochlorothiazide (HYDRODIURIL) 25 MG tablet Take 1 tablet (25 mg total) by mouth 2 (two) times daily. 02/11/16  Yes Ardith Dark, MD  naproxen (NAPROSYN) 250 MG tablet Take 1 tablet (250 mg total) by mouth 2 (two) times daily with a meal. 09/24/15  Yes Everlene Farrier, PA-C  ondansetron (ZOFRAN) 4 MG tablet Take 4 mg by mouth every 8 (eight) hours as needed for nausea or vomiting. For nausea/vomiting. 10/20/14  Yes Historical Provider, MD  oxycodone (OXY-IR) 5 MG capsule Take 5 mg by mouth every 4 (four) hours as needed for pain.   Yes Historical Provider, MD  oxyCODONE-acetaminophen (PERCOCET) 10-325 MG tablet Take 1  tablet by mouth every 4 (four) hours as needed for pain.   Yes Historical Provider, MD  penicillin v potassium (VEETID) 500 MG tablet Take 500 mg by mouth 4 (four) times daily. 03/17/16  Yes Historical Provider, MD  promethazine (PHENERGAN) 25 MG suppository Place 1 suppository (25 mg total) rectally every 6 (six) hours as needed for nausea or vomiting. 02/17/16  Yes Ardith Darkaleb M Parker, MD  propranolol ER (INDERAL LA) 80 MG 24 hr capsule Take 1 capsule (80 mg total) by mouth daily. 02/17/16  Yes Ardith Darkaleb M Parker, MD  sennosides-docusate sodium (SENOKOT-S) 8.6-50 MG tablet Take 1  tablet by mouth daily as needed for constipation.   Yes Historical Provider, MD  topiramate (TOPAMAX) 100 MG tablet Take 1.5 tablets (150 mg total) by mouth 2 (two) times daily. 09/02/15  Yes Van ClinesKaren M Aquino, MD  verapamil (CALAN-SR) 120 MG CR tablet Take 1 tablet (120 mg total) by mouth at bedtime. 09/02/15  Yes Van ClinesKaren M Aquino, MD  cyclobenzaprine (FLEXERIL) 5 MG tablet Take 1 tablet (5 mg total) by mouth 3 (three) times daily as needed for muscle spasms. Patient not taking: Reported on 03/25/2016 09/26/15   Yolande Jollyaleb G Melancon, MD  diphenhydrAMINE (BENADRYL) 25 MG tablet Take 1 tablet (25 mg total) by mouth every 6 (six) hours. 03/25/16   Arby BarretteMarcy Treacy Holcomb, MD  EPIPEN 2-PAK 0.3 MG/0.3ML SOAJ injection INJECT 1 SYRINGE IN THE MUSCLE ONCE 02/03/15   Ardith Darkaleb M Parker, MD  ergotamine-caffeine (CAFERGOT) 1-100 MG per tablet Two tablets at onset of attack; then 1 tablet every 30 minutes as needed; maximum: 6 tablets per attack; do not exceed 10 tablets/week Patient not taking: Reported on 03/25/2016 04/19/14   Rolland PorterMark James, MD    Family History Family History  Problem Relation Age of Onset  . Cancer Mother   . Cirrhosis Father   . Lupus Sister   . Diabetes Maternal Grandmother     Social History Social History  Substance Use Topics  . Smoking status: Never Smoker  . Smokeless tobacco: Never Used  . Alcohol use Yes     Comment: 02/14/2013 "socially; couple times/month"     Allergies   Apple; Aspirin; and Carrot [daucus carota]   Review of Systems Review of Systems 10 Systems reviewed and are negative for acute change except as noted in the HPI.   Physical Exam Updated Vital Signs BP 118/65   Pulse (!) 58   Temp 99 F (37.2 C)   Resp 16   SpO2 99%   Physical Exam  Constitutional: She is oriented to person, place, and time. She appears well-developed and well-nourished.  HENT:  Head: Normocephalic and atraumatic.  Right Ear: External ear normal.  Left Ear: External ear normal.  Nose: Nose  normal.  The patient's tongue is turned toward the left. She is able to open her mouth and make it protrude however there is some deviation and turning. No tongue swelling. Posterior oropharynx is widely patent. Patient's generalized trembling slightly.  Eyes: EOM are normal. Pupils are equal, round, and reactive to light.  Neck: Neck supple.  Cardiovascular: Normal rate, regular rhythm, normal heart sounds and intact distal pulses.   Pulmonary/Chest: Effort normal and breath sounds normal.  Abdominal: Soft. She exhibits no distension.  Musculoskeletal: Normal range of motion. She exhibits no tenderness or deformity.  Neurological: She is alert and oriented to person, place, and time. No cranial nerve deficit. She exhibits normal muscle tone.  Patient is alert and appropriate. She does have slight  facial rumbling of the lower face. Tongue is rolled in her mouth toward the left. She is able to protrude this and make straighten out. Remainder of neurologic exam is normal.  Skin: Skin is warm and dry. No rash noted.  Psychiatric: She has a normal mood and affect.     ED Treatments / Results  Labs (all labs ordered are listed, but only abnormal results are displayed) Labs Reviewed - No data to display  EKG  EKG Interpretation None       Radiology No results found.  Procedures Procedures (including critical care time)  Medications Ordered in ED Medications  diphenhydrAMINE (BENADRYL) injection 50 mg (50 mg Intravenous Given 03/25/16 1939)  benztropine mesylate (COGENTIN) injection 1 mg (1 mg Intravenous Given 03/25/16 2105)     Initial Impression / Assessment and Plan / ED Course  I have reviewed the triage vital signs and the nursing notes.  Pertinent labs & imaging results that were available during my care of the patient were reviewed by me and considered in my medical decision making (see chart for details).  Clinical Course  Comment By Time  Patient experienced  significant relief of tongue symptoms after administration of Benadryl. Now with examination of the tongue and is no longer rolled to the side. She protrudes it in a straight fashion. Patient reports she still feels like she has a tight and quivering sensation in her jaw. Will add a dose of Cogentin. Arby Barrette, MD 10/05 2034  Symptoms are much better. Patient now feels like her jaw moves comfortably and her tongue is back to normal. Arby Barrette, MD 10/05 2149     Final Clinical Impressions(s) / ED Diagnoses   Final diagnoses:  Dystonic drug reaction  Patient's symptoms consistent with a dystonic reaction due to aldol administered yesterday. Symptoms resolved with a combination of Benadryl and Cogentin. Patient is alert and appropriate and feels much improved.  New Prescriptions New Prescriptions   DIPHENHYDRAMINE (BENADRYL) 25 MG TABLET    Take 1 tablet (25 mg total) by mouth every 6 (six) hours.     Arby Barrette, MD 03/25/16 2200

## 2016-04-23 ENCOUNTER — Other Ambulatory Visit: Payer: Self-pay | Admitting: Family Medicine

## 2016-04-23 DIAGNOSIS — I10 Essential (primary) hypertension: Secondary | ICD-10-CM

## 2016-05-05 NOTE — Telephone Encounter (Signed)
Certified dismissal letter returned as undeliverable, unclaimed, return to sender after three attempts by [employer] on May 05, 2016. Letter placed in another envelope and resent as 1st class mail which does not require a signature. DAJ

## 2016-05-10 NOTE — Telephone Encounter (Signed)
Certified dismissal letter returned as undeliverable, unclaimed, return to sender after 1st class mail attempt by [employer] May 10, 2016 Letter placed in another envelope and resent as 1st class mail which does not require a signature. DAJ

## 2016-05-23 ENCOUNTER — Encounter (HOSPITAL_COMMUNITY): Payer: Self-pay

## 2016-05-23 ENCOUNTER — Emergency Department (HOSPITAL_COMMUNITY)
Admission: EM | Admit: 2016-05-23 | Discharge: 2016-05-23 | Disposition: A | Discharge status: Home / Self Care | Payer: Medicaid Other | Attending: Emergency Medicine | Admitting: Emergency Medicine

## 2016-05-23 DIAGNOSIS — I10 Essential (primary) hypertension: Secondary | ICD-10-CM | POA: Insufficient documentation

## 2016-05-23 DIAGNOSIS — G43909 Migraine, unspecified, not intractable, without status migrainosus: Secondary | ICD-10-CM | POA: Diagnosis present

## 2016-05-23 DIAGNOSIS — G43801 Other migraine, not intractable, with status migrainosus: Secondary | ICD-10-CM

## 2016-05-23 LAB — CBC WITH DIFFERENTIAL/PLATELET
Basophils Absolute: 0 10*3/uL (ref 0.0–0.1)
Basophils Relative: 0 %
EOS ABS: 0.1 10*3/uL (ref 0.0–0.7)
EOS PCT: 3 %
HCT: 41.8 % (ref 36.0–46.0)
Hemoglobin: 14 g/dL (ref 12.0–15.0)
LYMPHS ABS: 2.1 10*3/uL (ref 0.7–4.0)
Lymphocytes Relative: 55 %
MCH: 30.8 pg (ref 26.0–34.0)
MCHC: 33.5 g/dL (ref 30.0–36.0)
MCV: 91.9 fL (ref 78.0–100.0)
MONO ABS: 0.3 10*3/uL (ref 0.1–1.0)
MONOS PCT: 8 %
Neutro Abs: 1.3 10*3/uL — ABNORMAL LOW (ref 1.7–7.7)
Neutrophils Relative %: 34 %
PLATELETS: 191 10*3/uL (ref 150–400)
RBC: 4.55 MIL/uL (ref 3.87–5.11)
RDW: 14.5 % (ref 11.5–15.5)
WBC: 3.9 10*3/uL — AB (ref 4.0–10.5)

## 2016-05-23 LAB — BASIC METABOLIC PANEL
Anion gap: 10 (ref 5–15)
BUN: 9 mg/dL (ref 6–20)
CO2: 22 mmol/L (ref 22–32)
CREATININE: 1.17 mg/dL — AB (ref 0.44–1.00)
Calcium: 9.7 mg/dL (ref 8.9–10.3)
Chloride: 108 mmol/L (ref 101–111)
GFR calc Af Amer: >60 mL/min (ref >60)
GFR calc non Af Amer: 52 mL/min — ABNORMAL LOW (ref >60)
Glucose, Bld: 102 mg/dL — ABNORMAL HIGH (ref 65–99)
Potassium: 3.1 mmol/L — ABNORMAL LOW (ref 3.5–5.1)
SODIUM: 140 mmol/L (ref 135–145)

## 2016-05-23 LAB — I-STAT BETA HCG BLOOD, ED (MC, WL, AP ONLY): I-stat hCG, quantitative: <5 m[IU]/mL (ref <5)

## 2016-05-23 MED ORDER — SODIUM CHLORIDE 0.9 % IV BOLUS (SEPSIS)
1000.0000 mL | Freq: Once | INTRAVENOUS | Status: AC
  Administered 2016-05-23: 1000 mL via INTRAVENOUS

## 2016-05-23 MED ORDER — HALOPERIDOL LACTATE 5 MG/ML IJ SOLN
2.5000 mg | Freq: Once | INTRAMUSCULAR | Status: AC
  Administered 2016-05-23: 2.5 mg via INTRAVENOUS
  Filled 2016-05-23: qty 1

## 2016-05-23 MED ORDER — DIPHENHYDRAMINE HCL 50 MG/ML IJ SOLN
25.0000 mg | Freq: Once | INTRAMUSCULAR | Status: AC
  Administered 2016-05-23: 25 mg via INTRAVENOUS
  Filled 2016-05-23: qty 1

## 2016-05-23 MED ORDER — LORAZEPAM 2 MG/ML IJ SOLN
0.5000 mg | Freq: Once | INTRAMUSCULAR | Status: AC
  Administered 2016-05-23: 0.5 mg via INTRAVENOUS
  Filled 2016-05-23: qty 1

## 2016-05-23 MED ORDER — ONDANSETRON 4 MG PO TBDP
4.0000 mg | ORAL_TABLET | Freq: Once | ORAL | Status: AC
  Administered 2016-05-23: 4 mg via ORAL
  Filled 2016-05-23: qty 1

## 2016-05-23 MED ORDER — MAGNESIUM SULFATE 2 GM/50ML IV SOLN
2.0000 g | Freq: Once | INTRAVENOUS | Status: AC
  Administered 2016-05-23: 2 g via INTRAVENOUS
  Filled 2016-05-23: qty 50

## 2016-05-23 MED ORDER — PROCHLORPERAZINE EDISYLATE 5 MG/ML IJ SOLN
10.0000 mg | Freq: Once | INTRAMUSCULAR | Status: AC
  Administered 2016-05-23: 10 mg via INTRAVENOUS
  Filled 2016-05-23: qty 2

## 2016-05-23 MED ORDER — ONDANSETRON 4 MG PO TBDP
ORAL_TABLET | ORAL | Status: AC
  Filled 2016-05-23: qty 1

## 2016-05-23 MED ORDER — DEXAMETHASONE SODIUM PHOSPHATE 10 MG/ML IJ SOLN
10.0000 mg | Freq: Once | INTRAMUSCULAR | Status: AC
  Administered 2016-05-23: 10 mg via INTRAVENOUS
  Filled 2016-05-23: qty 1

## 2016-05-23 MED ORDER — PROMETHAZINE HCL 25 MG/ML IJ SOLN
25.0000 mg | Freq: Once | INTRAMUSCULAR | Status: DC
  Filled 2016-05-23: qty 1

## 2016-05-23 MED ORDER — SUMATRIPTAN 20 MG/ACT NA SOLN
20.0000 mg | Freq: Once | NASAL | Status: AC
  Administered 2016-05-23: 20 mg via NASAL
  Filled 2016-05-23: qty 1

## 2016-05-23 MED ORDER — ONDANSETRON 4 MG PO TBDP
4.0000 mg | ORAL_TABLET | Freq: Once | ORAL | Status: AC | PRN
  Administered 2016-05-23: 4 mg via ORAL

## 2016-05-23 MED ORDER — LIDOCAINE HCL 4 % EX SOLN
Freq: Once | CUTANEOUS | Status: AC
  Administered 2016-05-23: 20:00:00 via TOPICAL
  Filled 2016-05-23: qty 50

## 2016-05-23 NOTE — ED Provider Notes (Signed)
[facility]-EMERGENCY DEPT Provider Note   CSN: [id number] Arrival date & time: 05/23/16  1542     History   Chief Complaint Chief Complaint  Patient presents with  . Migraine    HPI  Blood pressure 160/97, pulse 102, temperature 98.5 F (36.9 C), resp. rate 18, height 5\' 5"  (1.651 m), weight 94.1 kg, SpO2 100 %.  Kirsten Walsh is a 55 y.o. female complaining of exacerbation of her chronic migraines onset 3 days ago. Patient states that she's had daily migraines for years, the migraine became particularly severe 3 days ago and is on the right side of her temporal and occipital area radiate down to the posterior right shoulder and are associated with photo and phonophobia. She taken multiple medications for both prevention and abortive medications with little relief including Topamax, propanolol, Reglan, Fioricet, and Percocet with little relief.  Dr. Emilio Math with the [facility]  Dr. Ilda Basset neurology  Past Medical History:  Diagnosis Date  . Chest pain   . Chronic lower back pain    "right side to mid back" (02/14/2013)  . Gall stones   . Hypertension   . Kidney stone ~ 2006  . Migraine    "qd" (02/14/2013)    Patient Active Problem List   Diagnosis Date Noted  . Chronic daily headache 09/02/2015  . 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 Faintness 09/02/2015  . Right arm weakness 08/05/2015  . Edema of right arm and right leg 09/07/2014  . IUD contraception 07/18/2014  . History of anaphylaxis 06/26/2014  . Intractable migraine with status migrainosus 06/10/2014  . Intractable migraine without aura and with status migrainosus   . Bradycardia 02/18/2013  . Cardiomyopathy 10/20/2011  . Decreased ejection fraction 10/05/2011  . Leg swelling 09/23/2011  . Complex regional pain syndrome 05/03/2011  . Chronic pain 03/29/2011  . NECK PAIN, RIGHT 02/03/2010  . ALLERGIC RHINITIS 01/30/2009  . DIZZINESS 07/16/2008  . Chronic migraine 04/26/2007  . OBESITY, NOS  08/18/2006  . HYPERTENSION, BENIGN SYSTEMIC 08/18/2006    Past Surgical History:  Procedure Laterality Date  . CYSTOSCOPY W/ STONE MANIPULATION  ~ 2006  . INTRAUTERINE DEVICE INSERTION     "initially put in in 04/2002; changed prn" (02/14/2013)  . LAPAROSCOPIC CHOLECYSTECTOMY  ~ 2004    OB History    No data available       Home Medications    Prior to Admission medications   Medication Sig Start Date End Date Taking? Authorizing Provider  butalbital-acetaminophen-caffeine (FIORICET, ESGIC) 50-325-40 MG per tablet Take 1 tablet by mouth every 6 (six) hours as needed. For migraine 11/25/14  Yes Historical Provider, MD  cloNIDine (CATAPRES) 0.1 MG tablet TAKE 1 TABLET BY MOUTH TWICE DAILY 04/26/16  Yes Ardith Dark, MD  EPIPEN 2-PAK 0.3 MG/0.3ML SOAJ injection INJECT 1 SYRINGE IN THE MUSCLE ONCE 02/03/15  Yes Ardith Dark, MD  hydrochlorothiazide (HYDRODIURIL) 25 MG tablet Take 1 tablet (25 mg total) by mouth 2 (two) times daily. 02/11/16  Yes Ardith Dark, MD  naproxen (NAPROSYN) 250 MG tablet Take 1 tablet (250 mg total) by mouth 2 (two) times daily with a meal. 09/24/15  Yes Everlene Farrier, PA-C  ondansetron (ZOFRAN) 4 MG tablet Take 4 mg by mouth every 8 (eight) hours as needed for nausea or vomiting. For nausea/vomiting. 10/20/14  Yes Historical Provider, MD  oxycodone (OXY-IR) 5 MG capsule Take 5 mg by mouth every 4 (four) hours as needed for pain.   Yes Historical Provider,  MD  propranolol ER (INDERAL LA) 80 MG 24 hr capsule Take 1 capsule (80 mg total) by mouth daily. 02/17/16  Yes Ardith Darkaleb M Parker, MD  sennosides-docusate sodium (SENOKOT-S) 8.6-50 MG tablet Take 1 tablet by mouth daily as needed for constipation.   Yes Historical Provider, MD  topiramate (TOPAMAX) 100 MG tablet Take 1.5 tablets (150 mg total) by mouth 2 (two) times daily. 09/02/15  Yes Van ClinesKaren M Aquino, MD  verapamil (CALAN-SR) 120 MG CR tablet Take 1 tablet (120 mg total) by mouth at bedtime. 09/02/15  Yes Van ClinesKaren M  Aquino, MD    Family History Family History  Problem Relation Age of Onset  . Cancer Mother   . Cirrhosis Father   . Lupus Sister   . Diabetes Maternal Grandmother     Social History Social History  Substance Use Topics  . Smoking status: Never Smoker  . Smokeless tobacco: Never Used  . Alcohol use Yes     Comment: 02/14/2013 "socially; couple times/month"     Allergies   Apple; Aspirin; and Carrot [daucus carota]   Review of Systems Review of Systems  10 systems reviewed and found to be negative, except as noted in the HPI.   Physical Exam Updated Vital Signs BP 129/81   Pulse 70   Temp 98.5 F (36.9 C)   Resp 21   Ht 5\' 5"  (1.651 m)   Wt 94.1 kg   SpO2 100%   BMI 34.51 kg/m   Physical Exam  Constitutional: She is oriented to person, place, and time. She appears well-developed and well-nourished. No distress.  Appears uncomfortable, photosensitive  HENT:  Head: Normocephalic and atraumatic.  Mouth/Throat: Oropharynx is clear and moist.  Eyes: Conjunctivae and EOM are normal. Pupils are equal, round, and reactive to light.  No TTP of maxillary or frontal sinuses  No TTP or induration of temporal arteries bilaterally  Neck: Normal range of motion. Neck supple.  FROM to C-spine. Pt can touch chin to chest without discomfort. No TTP of midline cervical spine.   Cardiovascular: Normal rate, regular rhythm and intact distal pulses.   Pulmonary/Chest: Effort normal and breath sounds normal. No respiratory distress. She has no wheezes. She has no rales. She exhibits no tenderness.  Abdominal: Soft. Bowel sounds are normal. There is no tenderness.  Musculoskeletal: Normal range of motion. She exhibits no edema or tenderness.  Neurological: She is alert and oriented to person, place, and time. No cranial nerve deficit.  II-Visual fields grossly intact. III/IV/VI-Extraocular movements intact.  Pupils reactive bilaterally. V/VII-Smile symmetric, equal eyebrow  raise,  facial sensation intact VIII- Hearing grossly intact IX/X-Normal gag XI-bilateral shoulder shrug XII-midline tongue extension Motor: 5/5 bilaterally with normal tone and bulk Cerebellar: Normal finger-to-nose  and normal heel-to-shin test.   Romberg negative Ambulates with a coordinated gait   Skin: She is not diaphoretic.  Psychiatric: She has a normal mood and affect.  Nursing note and vitals reviewed.    ED Treatments / Results  Labs (all labs ordered are listed, but only abnormal results are displayed) Labs Reviewed  CBC WITH DIFFERENTIAL/PLATELET - Abnormal; Notable for the following:       Result Value   WBC 3.9 (*)    Neutro Abs 1.3 (*)    All other components within normal limits  BASIC METABOLIC PANEL - Abnormal; Notable for the following:    Potassium 3.1 (*)    Glucose, Bld 102 (*)    Creatinine, Ser 1.17 (*)    GFR calc  non Af Amer 52 (*)    All other components within normal limits  I-STAT BETA HCG BLOOD, ED ([facility], [facility], [facility] ONLY)    EKG  EKG Interpretation  Date/Time:  Sunday May 23 2016 18:32:47 EST Ventricular Rate:  47 PR Interval:    QRS Duration: 103 QT Interval:  470 QTC Calculation: 416 R Axis:   46 Text Interpretation:  Sinus bradycardia Short PR interval Borderline T abnormalities, anterior leads Since last tracing rate slower Confirmed by [facility] HospitalWENTZ  MD, ELLIOTT ([id number]) on 05/23/2016 6:46:53 PM       Radiology No results found.  Procedures .Nerve Block Date/Time: 05/24/2016 1:45 AM Performed by: Wynetta EmeryPISCIOTTA, Caretha Rumbaugh Authorized by: Wynetta EmeryPISCIOTTA, Maverick Dieudonne   Consent:    Consent obtained:  Verbal   Consent given by:  Patient Indications:    Indications:  Pain relief Location:    Body area:  Head   Head nerve blocked: Left sphenopalatine.   Laterality:  Left Skin anesthesia (see MAR for exact dosages):    Skin anesthesia method:  None Procedure details (see MAR for exact dosages):    Needle gauge: 4% lidocaine administered on swab to  posterior left sphenopalatine ganglion.   Paresthesia:  None Post-procedure details:    Dressing:  None   Outcome:  Pain improved   Patient tolerance of procedure:  Tolerated well, no immediate complications   (including critical care time)  Medications Ordered in ED Medications  ondansetron (ZOFRAN-ODT) 4 MG disintegrating tablet (not administered)  promethazine (PHENERGAN) injection 25 mg (25 mg Intramuscular Not Given 05/23/16 2048)  ondansetron (ZOFRAN-ODT) disintegrating tablet 4 mg (4 mg Oral Given 05/23/16 1602)  sodium chloride 0.9 % bolus 1,000 mL (0 mLs Intravenous Stopped 05/23/16 2038)  magnesium sulfate IVPB 2 g 50 mL (0 g Intravenous Stopped 05/23/16 2123)  prochlorperazine (COMPAZINE) injection 10 mg (10 mg Intravenous Given 05/23/16 1938)  diphenhydrAMINE (BENADRYL) injection 25 mg (25 mg Intravenous Given 05/23/16 1939)  dexamethasone (DECADRON) injection 10 mg (10 mg Intravenous Given 05/23/16 1939)  lidocaine (XYLOCAINE) 4 % external solution ( Topical Given by Other 05/23/16 2023)  ondansetron (ZOFRAN-ODT) disintegrating tablet 4 mg (4 mg Oral Given 05/23/16 1823)  SUMAtriptan (IMITREX) nasal spray 20 mg (20 mg Nasal Given 05/23/16 2200)  haloperidol lactate (HALDOL) injection 2.5 mg (2.5 mg Intravenous Given 05/23/16 2201)  LORazepam (ATIVAN) injection 0.5 mg (0.5 mg Intravenous Given 05/23/16 2200)     Initial Impression / Assessment and Plan / ED Course  I have reviewed the triage vital signs and the nursing notes.  Pertinent labs & imaging results that were available during my care of the patient were reviewed by me and considered in my medical decision making (see chart for details).  Clinical Course     Vitals:   05/23/16 1559 05/23/16 1600 05/23/16 1817 05/23/16 2308  BP: 160/97  140/88 129/81  Pulse: 102  (!) 54 70  Resp: 18  20 21   Temp: 98.5 F (36.9 C)     SpO2: 100%  100% 100%  Weight:  94.1 kg    Height:  5\' 5"  (1.651 m)      Medications    ondansetron (ZOFRAN-ODT) 4 MG disintegrating tablet (not administered)  promethazine (PHENERGAN) injection 25 mg (25 mg Intramuscular Not Given 05/23/16 2048)  ondansetron (ZOFRAN-ODT) disintegrating tablet 4 mg (4 mg Oral Given 05/23/16 1602)  sodium chloride 0.9 % bolus 1,000 mL (0 mLs Intravenous Stopped 05/23/16 2038)  magnesium sulfate IVPB 2 g 50 mL (0 g Intravenous Stopped 05/23/16  2123)  prochlorperazine (COMPAZINE) injection 10 mg (10 mg Intravenous Given 05/23/16 1938)  diphenhydrAMINE (BENADRYL) injection 25 mg (25 mg Intravenous Given 05/23/16 1939)  dexamethasone (DECADRON) injection 10 mg (10 mg Intravenous Given 05/23/16 1939)  lidocaine (XYLOCAINE) 4 % external solution ( Topical Given by Other 05/23/16 2023)  ondansetron (ZOFRAN-ODT) disintegrating tablet 4 mg (4 mg Oral Given 05/23/16 1823)  SUMAtriptan (IMITREX) nasal spray 20 mg (20 mg Nasal Given 05/23/16 2200)  haloperidol lactate (HALDOL) injection 2.5 mg (2.5 mg Intravenous Given 05/23/16 2201)  LORazepam (ATIVAN) injection 0.5 mg (0.5 mg Intravenous Given 05/23/16 2200)    Kirsten Walsh is 55 y.o. female presenting with Severe exacerbation of her chronic headache. Nonfocal neurologic exam, headache typical for prior. Delay in obtaining IV, patient given headache cocktail of magnesium, Compazine, Benadryl and Decadron with little relief,  Sphenopalatine nerve block given with some relief, patient reports complete resolution of headache with Haldol and Ativan.  Evaluation does not show pathology that would require ongoing emergent intervention or inpatient treatment. Pt is hemodynamically stable and mentating appropriately. Discussed findings and plan with patient/guardian, who agrees with care plan. All questions answered. Return precautions discussed and outpatient follow up given.   Final Clinical Impressions(s) / ED Diagnoses   Final diagnoses:  Other migraine with status migrainosus, not intractable    New  Prescriptions Discharge Medication List as of 05/23/2016 10:53 PM       Wynetta Emery, PA-C 05/24/16 0147    Mancel Bale, MD 05/26/16 1012

## 2016-05-23 NOTE — Discharge Instructions (Signed)
Please follow with your primary care doctor in the next 2 days for a check-up. They must obtain records for further management.  ° °Do not hesitate to return to the Emergency Department for any new, worsening or concerning symptoms.  ° °

## 2016-05-23 NOTE — ED Triage Notes (Signed)
Pt reports has a migraine everyday. Onset 3 days migraine has been worse, has been vomiting. Medications not helping.

## 2016-08-05 ENCOUNTER — Encounter (HOSPITAL_BASED_OUTPATIENT_CLINIC_OR_DEPARTMENT_OTHER): Payer: Self-pay

## 2016-08-05 ENCOUNTER — Emergency Department (HOSPITAL_BASED_OUTPATIENT_CLINIC_OR_DEPARTMENT_OTHER)
Admission: EM | Admit: 2016-08-05 | Discharge: 2016-08-06 | Disposition: A | Discharge status: Home / Self Care | Payer: Medicaid Other | Attending: Emergency Medicine | Admitting: Emergency Medicine

## 2016-08-05 DIAGNOSIS — Z79899 Other long term (current) drug therapy: Secondary | ICD-10-CM | POA: Insufficient documentation

## 2016-08-05 DIAGNOSIS — Z791 Long term (current) use of non-steroidal anti-inflammatories (NSAID): Secondary | ICD-10-CM | POA: Diagnosis not present

## 2016-08-05 DIAGNOSIS — G43911 Migraine, unspecified, intractable, with status migrainosus: Secondary | ICD-10-CM | POA: Diagnosis not present

## 2016-08-05 DIAGNOSIS — I10 Essential (primary) hypertension: Secondary | ICD-10-CM | POA: Insufficient documentation

## 2016-08-05 DIAGNOSIS — R51 Headache: Secondary | ICD-10-CM | POA: Diagnosis present

## 2016-08-05 MED ORDER — DIPHENHYDRAMINE HCL 50 MG/ML IJ SOLN
25.0000 mg | Freq: Once | INTRAMUSCULAR | Status: AC
  Administered 2016-08-05: 25 mg via INTRAVENOUS
  Filled 2016-08-05: qty 1

## 2016-08-05 MED ORDER — DEXAMETHASONE SODIUM PHOSPHATE 10 MG/ML IJ SOLN
10.0000 mg | Freq: Once | INTRAMUSCULAR | Status: AC
  Administered 2016-08-05: 10 mg via INTRAVENOUS
  Filled 2016-08-05: qty 1

## 2016-08-05 MED ORDER — PROCHLORPERAZINE EDISYLATE 5 MG/ML IJ SOLN
10.0000 mg | Freq: Once | INTRAMUSCULAR | Status: AC
  Administered 2016-08-05: 10 mg via INTRAVENOUS
  Filled 2016-08-05: qty 2

## 2016-08-05 NOTE — ED Provider Notes (Signed)
[facility]   CSN: [id number] Arrival date & time: 08/05/16  1918  By signing my name below, I, Linna Darner, attest that this documentation has been prepared under the direction and in the presence of physician practitioner, Alvira Monday, MD. Electronically Signed: Linna Darner, Scribe. 08/05/2016. 10:51 PM.  History   Chief Complaint Chief Complaint  Patient presents with  . Migraine    The history is provided by the patient. No language interpreter was used.     HPI Comments: Kirsten Walsh is a 56 y.o. female with PMHx significant for migraines and HTN who presents to the Emergency Department complaining of a persistent, gradually worsening right-sided headache for about a week and a half. She states the pain radiates into her right ear and the right side of her neck. Pt reports associated nausea, photophobia, and phonophobia. She states her symptoms are consistent with prior migraines. Pt has tried Topamax and Fioricet which she typically uses for her migraines with no improvement of her current headache. No recent falls or trauma to her head. She reports some weakness of her right side at baseline with no acute change in this condition. She denies numbness/tingling, neuro deficits, speech difficulty, gait problem, fever, chills, or any other associated symptoms.  She has been seen in the ED several times over the last few years with similar symptoms.   Past Medical History:  Diagnosis Date  . Chest pain   . Chronic lower back pain    "right side to mid back" (02/14/2013)  . Gall stones   . Hypertension   . Kidney stone ~ 2006  . Migraine    "qd" (02/14/2013)    Patient Active Problem List   Diagnosis Date Noted  . Chronic daily headache 09/02/2015  . 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 Faintness 09/02/2015  . Right arm weakness 08/05/2015  . Edema of right arm and right leg 09/07/2014  . IUD contraception 07/18/2014  . History of  anaphylaxis 06/26/2014  . Intractable migraine with status migrainosus 06/10/2014  . Intractable migraine without aura and with status migrainosus   . Bradycardia 02/18/2013  . Cardiomyopathy 10/20/2011  . Decreased ejection fraction 10/05/2011  . Leg swelling 09/23/2011  . Complex regional pain syndrome 05/03/2011  . Chronic pain 03/29/2011  . NECK PAIN, RIGHT 02/03/2010  . ALLERGIC RHINITIS 01/30/2009  . DIZZINESS 07/16/2008  . Chronic migraine 04/26/2007  . OBESITY, NOS 08/18/2006  . HYPERTENSION, BENIGN SYSTEMIC 08/18/2006    Past Surgical History:  Procedure Laterality Date  . CYSTOSCOPY W/ STONE MANIPULATION  ~ 2006  . INTRAUTERINE DEVICE INSERTION     "initially put in in 04/2002; changed prn" (02/14/2013)  . LAPAROSCOPIC CHOLECYSTECTOMY  ~ 2004    OB History    No data available       Home Medications    Prior to Admission medications   Medication Sig Start Date End Date Taking? Authorizing Provider  butalbital-acetaminophen-caffeine (FIORICET, ESGIC) 50-325-40 MG per tablet Take 1 tablet by mouth every 6 (six) hours as needed. For migraine 11/25/14   Historical Provider, MD  cloNIDine (CATAPRES) 0.1 MG tablet TAKE 1 TABLET BY MOUTH TWICE DAILY 04/26/16   Ardith Dark, MD  EPIPEN 2-PAK 0.3 MG/0.3ML SOAJ injection INJECT 1 SYRINGE IN THE MUSCLE ONCE 02/03/15   Ardith Dark, MD  hydrochlorothiazide (HYDRODIURIL) 25 MG tablet Take 1 tablet (25 mg total) by mouth 2 (two) times daily. 02/11/16   Ardith Dark, MD  naproxen (NAPROSYN) 250  MG tablet Take 1 tablet (250 mg total) by mouth 2 (two) times daily with a meal. 09/24/15   Everlene FarrierWilliam Dansie, PA-C  ondansetron (ZOFRAN) 4 MG tablet Take 4 mg by mouth every 8 (eight) hours as needed for nausea or vomiting. For nausea/vomiting. 10/20/14   Historical Provider, MD  oxycodone (OXY-IR) 5 MG capsule Take 5 mg by mouth every 4 (four) hours as needed for pain.    Historical Provider, MD  propranolol ER (INDERAL LA) 80 MG 24 hr capsule  Take 1 capsule (80 mg total) by mouth daily. 02/17/16   Ardith Darkaleb M Parker, MD  sennosides-docusate sodium (SENOKOT-S) 8.6-50 MG tablet Take 1 tablet by mouth daily as needed for constipation.    Historical Provider, MD  topiramate (TOPAMAX) 100 MG tablet Take 1.5 tablets (150 mg total) by mouth 2 (two) times daily. 09/02/15   Van ClinesKaren M Aquino, MD  verapamil (CALAN-SR) 120 MG CR tablet Take 1 tablet (120 mg total) by mouth at bedtime. 09/02/15   Van ClinesKaren M Aquino, MD    Family History Family History  Problem Relation Age of Onset  . Cancer Mother   . Cirrhosis Father   . Lupus Sister   . Diabetes Maternal Grandmother     Social History Social History  Substance Use Topics  . Smoking status: Never Smoker  . Smokeless tobacco: Never Used  . Alcohol use Yes     Comment: occ     Allergies   Apple; Aspirin; and Carrot [daucus carota]   Review of Systems Review of Systems  Constitutional: Negative for chills and fever.  HENT: Positive for ear pain. Negative for sore throat.   Eyes: Positive for photophobia. Negative for visual disturbance.  Respiratory: Negative for cough and shortness of breath.   Cardiovascular: Negative for chest pain.  Gastrointestinal: Positive for nausea and vomiting. Negative for abdominal pain.  Genitourinary: Negative for difficulty urinating.  Musculoskeletal: Positive for neck pain. Negative for gait problem.  Skin: Negative for rash.  Neurological: Positive for weakness (baseline) and headaches. Negative for syncope, facial asymmetry, speech difficulty and numbness.     Physical Exam Updated Vital Signs BP 129/92 (BP Location: Left Arm)   Pulse 80   Temp 98.4 F (36.9 C) (Oral)   Resp 20   Ht 5\' 5"  (1.651 m)   Wt 208 lb (94.3 kg)   SpO2 99%   BMI 34.61 kg/m   Physical Exam  Constitutional: She is oriented to person, place, and time. She appears well-developed and well-nourished. No distress.  HENT:  Head: Normocephalic and atraumatic.    Mouth/Throat: Oropharynx is clear and moist. No oropharyngeal exudate.  Eyes: Conjunctivae and EOM are normal. Pupils are equal, round, and reactive to light.  photophobia  Neck: Neck supple.  Cardiovascular: Normal rate, regular rhythm and intact distal pulses.   Pulmonary/Chest: Effort normal. No respiratory distress.  Musculoskeletal: Normal range of motion.  Neurological: She is alert and oriented to person, place, and time. No cranial nerve deficit.  Right-sided weakness which is baseline for patient. With UE she has drift but no pronation on right. Baseline.  Normal sensation. Cranial nerves 2-12 intact.  Skin: Skin is warm and dry.  Psychiatric: She has a normal mood and affect. Her behavior is normal.  Nursing note and vitals reviewed.    ED Treatments / Results  Labs (all labs ordered are listed, but only abnormal results are displayed) Labs Reviewed - No data to display  EKG  EKG Interpretation None  Radiology No results found.  Procedures Procedures (including critical care time)  DIAGNOSTIC STUDIES: Oxygen Saturation is 100% on RA, normal by my interpretation.    COORDINATION OF CARE: 10:57 PM Discussed treatment plan with pt at bedside and pt agreed to plan.  Medications Ordered in ED Medications  ketorolac (TORADOL) 30 MG/ML injection 30 mg (not administered)  haloperidol lactate (HALDOL) injection 2 mg (not administered)  magnesium sulfate IVPB 2 g 50 mL (not administered)  sodium chloride 0.9 % bolus 1,000 mL (not administered)  dexamethasone (DECADRON) injection 10 mg (10 mg Intravenous Given 08/05/16 2315)  prochlorperazine (COMPAZINE) injection 10 mg (10 mg Intravenous Given 08/05/16 2318)  diphenhydrAMINE (BENADRYL) injection 25 mg (25 mg Intravenous Given 08/05/16 2313)     Initial Impression / Assessment and Plan / ED Course  I have reviewed the triage vital signs and the nursing notes.  Pertinent labs & imaging results that were  available during my care of the patient were reviewed by me and considered in my medical decision making (see chart for details).     56yo female with history of migraines presents with concern of headache.  Headache began slowly, no trauma, no fevers, and unchanged neuro exam from baseline per pt and have low suspicion for [facility], SDH or meningitis.  Patient was given decadron, compazine and benadryl without improvement in headache.  It appears at ED visits in the past haldol and magnesium had improved her symptoms.  She had one visit with dystonia one day after this administration however appears to have received it since that time and on other occasions without this side effect.  Will give toradol, haldol, magnesium and IV normal saline.  Patient care assigned to Dr. Elesa Massed with response to medication pending.   Final Clinical Impressions(s) / ED Diagnoses   Final diagnoses:  Intractable migraine with status migrainosus, unspecified migraine type    New Prescriptions New Prescriptions   No medications on file   I personally performed the services described in this documentation, which was scribed in my presence. The recorded information has been reviewed and is accurate.    Alvira Monday, MD 08/06/16 [phone]

## 2016-08-05 NOTE — ED Triage Notes (Signed)
C/o migraine x 1.5 week-was seen at pain clinic yesterday-NAD-steady gait

## 2016-08-06 ENCOUNTER — Encounter (HOSPITAL_COMMUNITY): Payer: Self-pay

## 2016-08-06 ENCOUNTER — Ambulatory Visit (HOSPITAL_COMMUNITY)
Admission: EM | Admit: 2016-08-06 | Discharge: 2016-08-06 | Disposition: A | Discharge status: Home / Self Care | Payer: Medicaid Other | Attending: Family Medicine | Admitting: Family Medicine

## 2016-08-06 DIAGNOSIS — G43101 Migraine with aura, not intractable, with status migrainosus: Secondary | ICD-10-CM | POA: Diagnosis not present

## 2016-08-06 MED ORDER — KETOROLAC TROMETHAMINE 30 MG/ML IJ SOLN
30.0000 mg | Freq: Once | INTRAMUSCULAR | Status: AC
  Administered 2016-08-06: 30 mg via INTRAVENOUS
  Filled 2016-08-06: qty 1

## 2016-08-06 MED ORDER — HALOPERIDOL LACTATE 5 MG/ML IJ SOLN
2.0000 mg | Freq: Once | INTRAMUSCULAR | Status: DC
  Filled 2016-08-06: qty 1

## 2016-08-06 MED ORDER — SODIUM CHLORIDE 0.9 % IV BOLUS (SEPSIS)
1000.0000 mL | Freq: Once | INTRAVENOUS | Status: AC
  Administered 2016-08-06: 1000 mL via INTRAVENOUS

## 2016-08-06 MED ORDER — METOCLOPRAMIDE HCL 5 MG/ML IJ SOLN
10.0000 mg | Freq: Once | INTRAMUSCULAR | Status: AC
  Administered 2016-08-06: 10 mg via INTRAVENOUS

## 2016-08-06 MED ORDER — DEXAMETHASONE SODIUM PHOSPHATE 10 MG/ML IJ SOLN
INTRAMUSCULAR | Status: AC
  Filled 2016-08-06: qty 1

## 2016-08-06 MED ORDER — DEXAMETHASONE SODIUM PHOSPHATE 10 MG/ML IJ SOLN
10.0000 mg | Freq: Once | INTRAMUSCULAR | Status: AC
  Administered 2016-08-06: 10 mg via INTRAVENOUS

## 2016-08-06 MED ORDER — METOCLOPRAMIDE HCL 5 MG/ML IJ SOLN
INTRAMUSCULAR | Status: AC
  Filled 2016-08-06: qty 2

## 2016-08-06 MED ORDER — MAGNESIUM SULFATE 2 GM/50ML IV SOLN
2.0000 g | Freq: Once | INTRAVENOUS | Status: AC
  Administered 2016-08-06: 2 g via INTRAVENOUS
  Filled 2016-08-06: qty 50

## 2016-08-06 MED ORDER — DIPHENHYDRAMINE HCL 50 MG/ML IJ SOLN
25.0000 mg | Freq: Once | INTRAMUSCULAR | Status: AC
Start: 2016-08-06 — End: 2016-08-06
  Administered 2016-08-06: 25 mg via INTRAVENOUS

## 2016-08-06 MED ORDER — DIPHENHYDRAMINE HCL 50 MG/ML IJ SOLN
INTRAMUSCULAR | Status: AC
  Filled 2016-08-06: qty 1

## 2016-08-06 NOTE — Discharge Instructions (Signed)
Please take your medications as prescribed. Please follow-up with your neurologist for further management.

## 2016-08-06 NOTE — ED Provider Notes (Signed)
CSN: [id number]     Arrival date & time 08/06/16  1200 History   First MD Initiated Contact with Patient 08/06/16 1246     Chief Complaint  Patient presents with  . Migraine   (Consider location/radiation/quality/duration/timing/severity/associated sxs/prior Treatment) HPI Linard MillersConstance M Phoenix is a 56 y.o. female presenting to UC with c/o gradually worsening migraine that is 10/10 aching and sore all over her head with associated photophobia, nausea and vomiting.  She reports daily headaches but headaches have been more severe over the last 1 month.  [facility] for same. Was discharged around 2:45AM.  She notes her HA was down to 4/10 but has since continued to worsen.  She has not f/u with her HA specialist over this past month. She has a f/u with her PCP coming up. She is unsure what triggers her migraines.  She has tried her prescription HA medications w/o relief. Denies numbness in arms or legs but has chronic Right sided weakness, well documented in last nights ED visit.    Past Medical History:  Diagnosis Date  . Chest pain   . Chronic lower back pain    "right side to mid back" (02/14/2013)  . Gall stones   . Hypertension   . Kidney stone ~ 2006  . Migraine    "qd" (02/14/2013)   Past Surgical History:  Procedure Laterality Date  . CYSTOSCOPY W/ STONE MANIPULATION  ~ 2006  . INTRAUTERINE DEVICE INSERTION     "initially put in in 04/2002; changed prn" (02/14/2013)  . LAPAROSCOPIC CHOLECYSTECTOMY  ~ 2004   Family History  Problem Relation Age of Onset  . Cancer Mother   . Cirrhosis Father   . Lupus Sister   . Diabetes Maternal Grandmother    Social History  Substance Use Topics  . Smoking status: Never Smoker  . Smokeless tobacco: Never Used  . Alcohol use Yes     Comment: occ   OB History    No data available     Review of Systems  Constitutional: Negative for chills and fever.  Eyes: Positive for  photophobia. Negative for pain.  Respiratory: Negative for cough and shortness of breath.   Cardiovascular: Negative for chest pain, palpitations and leg swelling.  Gastrointestinal: Positive for nausea and vomiting. Negative for abdominal pain and diarrhea.  Musculoskeletal: Negative for neck pain and neck stiffness.  Skin: Negative for rash.  Neurological: Positive for headaches. Negative for dizziness and light-headedness.    Allergies  Apple; Aspirin; Carrot [daucus carota]; and Haloperidol and related  Home Medications   Prior to Admission medications   Medication Sig Start Date End Date Taking? Authorizing Provider  butalbital-acetaminophen-caffeine (FIORICET, ESGIC) 50-325-40 MG per tablet Take 1 tablet by mouth every 6 (six) hours as needed. For migraine 11/25/14  Yes Historical Provider, MD  cloNIDine (CATAPRES) 0.1 MG tablet TAKE 1 TABLET BY MOUTH TWICE DAILY 04/26/16  Yes Ardith Darkaleb M Parker, MD  EPIPEN 2-PAK 0.3 MG/0.3ML SOAJ injection INJECT 1 SYRINGE IN THE MUSCLE ONCE 02/03/15  Yes Ardith Darkaleb M Parker, MD  hydrochlorothiazide (HYDRODIURIL) 25 MG tablet Take 1 tablet (25 mg total) by mouth 2 (two) times daily. 02/11/16  Yes Ardith Darkaleb M Parker, MD  naproxen (NAPROSYN) 250 MG tablet Take 1 tablet (250 mg total) by mouth 2 (two) times daily with a meal. 09/24/15  Yes Everlene FarrierWilliam Dansie, PA-C  ondansetron (ZOFRAN) 4 MG tablet Take 4 mg by mouth every 8 (eight) hours as needed  for nausea or vomiting. For nausea/vomiting. 10/20/14  Yes Historical Provider, MD  oxycodone (OXY-IR) 5 MG capsule Take 5 mg by mouth every 4 (four) hours as needed for pain.   Yes Historical Provider, MD  propranolol ER (INDERAL LA) 80 MG 24 hr capsule Take 1 capsule (80 mg total) by mouth daily. 02/17/16  Yes Ardith Dark, MD  sennosides-docusate sodium (SENOKOT-S) 8.6-50 MG tablet Take 1 tablet by mouth daily as needed for constipation.   Yes Historical Provider, MD  topiramate (TOPAMAX) 100 MG tablet Take 1.5 tablets (150 mg  total) by mouth 2 (two) times daily. 09/02/15  Yes Van Clines, MD  verapamil (CALAN-SR) 120 MG CR tablet Take 1 tablet (120 mg total) by mouth at bedtime. 09/02/15  Yes Van Clines, MD   Meds Ordered and Administered this Visit   Medications  metoCLOPramide (REGLAN) injection 10 mg (10 mg Intravenous Given 08/06/16 1328)  diphenhydrAMINE (BENADRYL) injection 25 mg (25 mg Intravenous Given 08/06/16 1330)  dexamethasone (DECADRON) injection 10 mg (10 mg Intravenous Given 08/06/16 1331)  sodium chloride 0.9 % bolus 1,000 mL (1,000 mLs Intravenous Given 08/06/16 1327)    BP (!) 157/105 (BP Location: Left Arm)   Pulse 95   Temp 99.9 F (37.7 C) (Oral)   Resp 20   SpO2 99%  No data found.   Physical Exam  Constitutional: She is oriented to person, place, and time. She appears well-developed and well-nourished.  Pt sitting in exam chair, blinking rapidly. Appears to have photophobia. Appears uncomfortable but is alert and cooperative during exam.  HENT:  Head: Normocephalic and atraumatic.  Right Ear: Tympanic membrane normal.  Left Ear: Tympanic membrane normal.  Nose: Nose normal.  Mouth/Throat: Uvula is midline, oropharynx is clear and moist and mucous membranes are normal.  Eyes: Conjunctivae and EOM are normal. Pupils are equal, round, and reactive to light.  Neck: Normal range of motion. Neck supple.  No nuchal rigidity or meningeal signs.  Cardiovascular: Normal rate and regular rhythm.   Pulmonary/Chest: Effort normal and breath sounds normal. No respiratory distress. She has no wheezes. She has no rales.  Musculoskeletal: Normal range of motion.  Neurological: She is alert and oriented to person, place, and time.  CN II-XII in tact. Speech is clear, alert to person, place and time.   Skin: Skin is warm and dry. No rash noted. She is not diaphoretic. No erythema.  Psychiatric: She has a normal mood and affect. Her behavior is normal.  Nursing note and vitals  reviewed.   Urgent Care Course     Procedures (including critical care time)  Labs Review Labs Reviewed - No data to display  Imaging Review No results found.  2:33 PM pt sleeping comfortable. IV fluids running.    MDM   1. Migraine with aura and with status migrainosus, not intractable    Pt c/o worsening migraine. Feels similar to prior migraines. No red flag symptoms.  Tx in UC: 1L IV fluids, decadron 10mg , benadryl 25mg , and reglan 10mg  IV given  Pt feels much improved. 6/10 down from 10/10 severity.  She feels comfortable being discharged home. F/u with PCP on Monday as previously scheduled. Discussed symptoms that warrant emergent care in the ED.     Junius Finner, PA-C 08/06/16 1510

## 2016-08-06 NOTE — ED Triage Notes (Signed)
Migraine for a week and also having nausea and vomiting. Took her medication and it aint helping. Has a hx of passing out with her migraines.

## 2016-08-06 NOTE — ED Provider Notes (Signed)
2:45 AM  Assumed care from Dr. Dalene SeltzerSchlossman.  Pt is a 56 y.o. female with history of migraine headaches. Presents with her typical migraine. No new neurologic deficits. No fever. No head injury. Patient received Compazine, Benadryl, Decadron. Also received Toradol, magnesium and IV fluids. Patient reports her headache is now a 4/10. Have offered her further treatment which she declined stating that she feels well enough to go home. She was able to ambulate out of the emergency department with her husband. Smiling and appears comfortable and nontoxic. Recommended close follow-up with her neurologist. She is comfortable with this plan.   Layla MawKristen N Ward, DO 08/06/16 [phone]

## 2016-08-09 ENCOUNTER — Ambulatory Visit (INDEPENDENT_AMBULATORY_CARE_PROVIDER_SITE_OTHER): Payer: Medicaid Other | Admitting: Family Medicine

## 2016-08-09 ENCOUNTER — Encounter: Payer: Self-pay | Admitting: Family Medicine

## 2016-08-09 VITALS — BP 134/82 | HR 66 | Temp 98.4°F | Wt 206.0 lb

## 2016-08-09 DIAGNOSIS — I10 Essential (primary) hypertension: Secondary | ICD-10-CM

## 2016-08-09 DIAGNOSIS — Z23 Encounter for immunization: Secondary | ICD-10-CM | POA: Diagnosis not present

## 2016-08-09 DIAGNOSIS — Z1231 Encounter for screening mammogram for malignant neoplasm of breast: Secondary | ICD-10-CM | POA: Diagnosis not present

## 2016-08-09 DIAGNOSIS — Z1159 Encounter for screening for other viral diseases: Secondary | ICD-10-CM | POA: Diagnosis not present

## 2016-08-09 DIAGNOSIS — R739 Hyperglycemia, unspecified: Secondary | ICD-10-CM | POA: Diagnosis not present

## 2016-08-09 DIAGNOSIS — G43709 Chronic migraine without aura, not intractable, without status migrainosus: Secondary | ICD-10-CM

## 2016-08-09 DIAGNOSIS — Z0000 Encounter for general adult medical examination without abnormal findings: Secondary | ICD-10-CM | POA: Diagnosis not present

## 2016-08-09 DIAGNOSIS — Z114 Encounter for screening for human immunodeficiency virus [HIV]: Secondary | ICD-10-CM

## 2016-08-09 DIAGNOSIS — IMO0002 Reserved for concepts with insufficient information to code with codable children: Secondary | ICD-10-CM

## 2016-08-09 DIAGNOSIS — Z1239 Encounter for other screening for malignant neoplasm of breast: Secondary | ICD-10-CM

## 2016-08-09 LAB — COMPLETE METABOLIC PANEL WITH GFR
ALBUMIN: 3.8 g/dL (ref 3.6–5.1)
ALK PHOS: 55 U/L (ref 33–130)
ALT: 13 U/L (ref 6–29)
AST: 14 U/L (ref 10–35)
BILIRUBIN TOTAL: 0.6 mg/dL (ref 0.2–1.2)
BUN: 12 mg/dL (ref 7–25)
CALCIUM: 8.6 mg/dL (ref 8.6–10.4)
CO2: 23 mmol/L (ref 20–31)
Chloride: 108 mmol/L (ref 98–110)
Creat: 1.19 mg/dL — ABNORMAL HIGH (ref 0.50–1.05)
GFR, EST AFRICAN AMERICAN: 60 mL/min (ref >=60)
GFR, EST NON AFRICAN AMERICAN: 52 mL/min — AB (ref >=60)
GLUCOSE: 111 mg/dL — AB (ref 65–99)
Potassium: 3.4 mmol/L — ABNORMAL LOW (ref 3.5–5.3)
Sodium: 141 mmol/L (ref 135–146)
TOTAL PROTEIN: 6.3 g/dL (ref 6.1–8.1)

## 2016-08-09 LAB — HIV ANTIBODY (ROUTINE TESTING W REFLEX): HIV 1&2 Ab, 4th Generation: NONREACTIVE

## 2016-08-09 LAB — LIPID PANEL
Cholesterol: 156 mg/dL (ref <200)
HDL: 72 mg/dL (ref >50)
LDL Cholesterol: 68 mg/dL (ref <100)
TRIGLYCERIDES: 78 mg/dL (ref <150)
Total CHOL/HDL Ratio: 2.2 Ratio (ref <5.0)
VLDL: 16 mg/dL (ref <30)

## 2016-08-09 LAB — CBC
HEMATOCRIT: 40.4 % (ref 35.0–45.0)
HEMOGLOBIN: 13.5 g/dL (ref 11.7–15.5)
MCH: 30.2 pg (ref 27.0–33.0)
MCHC: 33.4 g/dL (ref 32.0–36.0)
MCV: 90.4 fL (ref 80.0–100.0)
MPV: 10.6 fL (ref 7.5–12.5)
Platelets: 220 10*3/uL (ref 140–400)
RBC: 4.47 MIL/uL (ref 3.80–5.10)
RDW: 14.4 % (ref 11.0–15.0)
WBC: 4.1 10*3/uL (ref 3.8–10.8)

## 2016-08-09 LAB — HEPATITIS C ANTIBODY: HCV Ab: NEGATIVE

## 2016-08-09 LAB — POCT GLYCOSYLATED HEMOGLOBIN (HGB A1C): HEMOGLOBIN A1C: 6

## 2016-08-09 MED ORDER — PROMETHAZINE HCL 25 MG RE SUPP: 25.0000 mg | Freq: Four times a day (QID) | RECTAL | 0 refills | Status: DC | PRN

## 2016-08-09 MED ORDER — PROMETHAZINE HCL 25 MG PO TABS: 25.0000 mg | ORAL_TABLET | Freq: Three times a day (TID) | ORAL | 0 refills | Status: DC | PRN

## 2016-08-09 MED ORDER — SUMATRIPTAN SUCCINATE 25 MG PO TABS: 25.0000 mg | ORAL_TABLET | ORAL | 0 refills | Status: DC | PRN

## 2016-08-09 NOTE — Patient Instructions (Signed)
I will send in a prescription for the imitrex. If it causes palpitations, please stop and let me know.  I also sent in phenergan for nausea.  We will check blood work today.  Please schedule your mammogram and colonoscopy when possible.  You are due for your pap. Come back to get this soon.  Take care,  Dr Jimmey RalphParker

## 2016-08-09 NOTE — Assessment & Plan Note (Signed)
No red flag signs or symptoms. Will retry imitrex today. Gave phenergan suppositories and tablets for use as needed for nausea. Continue propranolol, topamax, and verapamil. Has follow up with neurology schedued.

## 2016-08-09 NOTE — Assessment & Plan Note (Signed)
At goal. Continue clonidine, HCTZ, propranolol, and verapamil.

## 2016-08-09 NOTE — Assessment & Plan Note (Signed)
Was not able to fully address today as patient arrived to appointment 18 minutes late.   HCV, HIV drawn today. Flu vaccine given. Mammogram ordered. Discussed need for colonoscopy. Asked her to schedule follow up for pap test.

## 2016-08-09 NOTE — Progress Notes (Signed)
    Subjective:  Kirsten Walsh is a 56 y.o. female [facility] with a chief complaint of headache.   HPI:  Headache Chronic problem for patient. Currently on topamax and propranolol. She regularly takes fioricet and naproxen for the headaches. She has been to the ED 5 times within the past few weeks with severe headaches that responded to migraine cocktails. Her headache is constant. Currently rates as about a 5-6/10. She has tried imitrex in the past but had to stop due to it causing palpitations. She has chronic weakness on her right side which is at her baseline. She has also had some nausea for which she has been taking zofran which has not seemed to help much. She has been following up with neurology.   Hypertension BP Readings from Last 3 Encounters:  08/09/16 134/82  08/06/16 (!) 157/105  08/06/16 111/75   Home BP monitoring- Yes Compliant with medications-yes, without side effects ROS-Denies any CP, SOB, LE edema, transient weakness, orthopnea, PND.   HCM Due for HCV, HIV, mammogram, colonoscopy, flu vaccine, and pap.   ROS: Per HPI  PMH: Smoking history reviewed.    Objective:  Physical Exam: BP 134/82   Pulse 66   Temp 98.4 F (36.9 C) (Oral)   Wt 206 lb (93.4 kg)   SpO2 99%   BMI 34.28 kg/m   Gen: NAD, resting comfortably CV: RRR with no murmurs appreciated Pulm: NWOB, CTAB with no crackles, wheezes, or rhonchi GI: Normal bowel sounds present. Soft, Nontender, Nondistended. MSK: no edema, cyanosis, or clubbing noted Skin: warm, dry Neuro: CN2-12 intact. Strength 4+ in RUE, 5/5 in LUE (baseline for patient). Sensation intact grossly.  Psych: Normal affect and thought content  Assessment/Plan:  Chronic migraine No red flag signs or symptoms. Will retry imitrex today. Gave phenergan suppositories and tablets for use as needed for nausea. Continue propranolol, topamax, and verapamil. Has follow up with neurology schedued.    HYPERTENSION, BENIGN SYSTEMIC At goal. Continue clonidine, HCTZ, propranolol, and verapamil.   Healthcare maintenance Was not able to fully address today as patient arrived to appointment 18 minutes late.   HCV, HIV drawn today. Flu vaccine given. Mammogram ordered. Discussed need for colonoscopy. Asked her to schedule follow up for pap test.   [name], MD [facility] Resident PGY-3 08/09/2016 9:27 AM

## 2016-08-11 ENCOUNTER — Telehealth: Payer: Self-pay | Admitting: Family Medicine

## 2016-08-11 NOTE — Telephone Encounter (Signed)
Called patient to discuss lab results. Discussed elevated A1c. Patient stated that she has started working out more. Would like to defer starting metformin at this time. Would consider rechecking 1-2 times a year going forward. Patient voiced understanding and had no further questions.  [name], MD [facility] Resident PGY-3 08/11/2016 12:22 PM

## 2016-08-18 ENCOUNTER — Encounter: Payer: Self-pay | Admitting: Family Medicine

## 2016-08-18 ENCOUNTER — Ambulatory Visit (INDEPENDENT_AMBULATORY_CARE_PROVIDER_SITE_OTHER): Payer: Medicaid Other | Admitting: Family Medicine

## 2016-08-18 ENCOUNTER — Other Ambulatory Visit (HOSPITAL_COMMUNITY)
Admission: RE | Admit: 2016-08-18 | Discharge: 2016-08-18 | Disposition: A | Discharge status: Home / Self Care | Payer: Medicaid Other | Source: Ambulatory Visit | Attending: Family Medicine | Admitting: Family Medicine

## 2016-08-18 VITALS — BP 128/64 | HR 50 | Temp 98.9°F | Ht 65.0 in | Wt 206.0 lb

## 2016-08-18 DIAGNOSIS — Z01419 Encounter for gynecological examination (general) (routine) without abnormal findings: Secondary | ICD-10-CM | POA: Insufficient documentation

## 2016-08-18 DIAGNOSIS — Z1151 Encounter for screening for human papillomavirus (HPV): Secondary | ICD-10-CM | POA: Diagnosis not present

## 2016-08-18 NOTE — Progress Notes (Addendum)
    Subjective:  Kirsten Walsh is a 56 y.o. female [facility] with a chief complaint of pap smear.   HPI:  Pap Smear Patient presents today for routine pap smear. Last pap in 2014 which was normal. No vaginal discharge or bleeding. Sexually active with her husband.   ROS: Per HPI  Objective:  Physical Exam: BP 128/64   Pulse (!) 50   Temp 98.9 F (37.2 C)   Ht 5\' 5"  (1.651 m)   Wt 206 lb (93.4 kg)   SpO2 99%   BMI 34.28 kg/m   Gen: NAD, resting comfortably CV: RRR with no murmurs appreciated Pulm: NWOB, CTAB with no crackles, wheezes, or rhonchi GI: Normal bowel sounds present. Soft, Nontender, Nondistended. GU: Normal external female genitalia. Normal internal female genitalia. Cervix identified. IUD strings in place.   Assessment/Plan:  Healthcare Maintenance Pap test sent today. Will send letter with results if normal.  Due for mammogram and colonoscopy. Informed patient and instructed her to call when she was ready to schedule these.   [name], MD [facility] Resident PGY-3 08/18/2016 5:19 PM

## 2016-08-18 NOTE — Patient Instructions (Signed)

## 2016-08-23 LAB — CYTOLOGY - PAP
Adequacy: ABSENT
Diagnosis: NEGATIVE
HPV: NOT DETECTED

## 2016-08-24 ENCOUNTER — Encounter: Payer: Self-pay | Admitting: Family Medicine

## 2016-10-06 ENCOUNTER — Other Ambulatory Visit: Payer: Self-pay | Admitting: Neurology

## 2016-10-06 ENCOUNTER — Other Ambulatory Visit: Payer: Self-pay | Admitting: Family Medicine

## 2016-10-06 DIAGNOSIS — G43709 Chronic migraine without aura, not intractable, without status migrainosus: Secondary | ICD-10-CM

## 2016-10-06 DIAGNOSIS — IMO0002 Reserved for concepts with insufficient information to code with codable children: Secondary | ICD-10-CM

## 2016-11-29 ENCOUNTER — Encounter (HOSPITAL_BASED_OUTPATIENT_CLINIC_OR_DEPARTMENT_OTHER): Payer: Self-pay

## 2016-11-29 ENCOUNTER — Emergency Department (HOSPITAL_BASED_OUTPATIENT_CLINIC_OR_DEPARTMENT_OTHER)
Admission: EM | Admit: 2016-11-29 | Discharge: 2016-11-30 | Disposition: A | Discharge status: Home / Self Care | Payer: Medicaid Other | Attending: Emergency Medicine | Admitting: Emergency Medicine

## 2016-11-29 DIAGNOSIS — G43409 Hemiplegic migraine, not intractable, without status migrainosus: Secondary | ICD-10-CM | POA: Insufficient documentation

## 2016-11-29 DIAGNOSIS — I10 Essential (primary) hypertension: Secondary | ICD-10-CM | POA: Diagnosis not present

## 2016-11-29 DIAGNOSIS — R51 Headache: Secondary | ICD-10-CM | POA: Diagnosis present

## 2016-11-29 MED ORDER — SODIUM CHLORIDE 0.9 % IV BOLUS (SEPSIS)
1000.0000 mL | Freq: Once | INTRAVENOUS | Status: AC
  Administered 2016-11-29: 1000 mL via INTRAVENOUS

## 2016-11-29 MED ORDER — MAGNESIUM SULFATE IN D5W 1-5 GM/100ML-% IV SOLN
1.0000 g | Freq: Once | INTRAVENOUS | Status: AC
  Administered 2016-11-29: 1 g via INTRAVENOUS
  Filled 2016-11-29: qty 100

## 2016-11-29 MED ORDER — MORPHINE SULFATE (PF) 4 MG/ML IV SOLN
4.0000 mg | Freq: Once | INTRAVENOUS | Status: AC
  Administered 2016-11-29: 4 mg via INTRAVENOUS
  Filled 2016-11-29: qty 1

## 2016-11-29 MED ORDER — KETOROLAC TROMETHAMINE 30 MG/ML IJ SOLN
30.0000 mg | Freq: Once | INTRAMUSCULAR | Status: AC
  Administered 2016-11-29: 30 mg via INTRAVENOUS
  Filled 2016-11-29: qty 1

## 2016-11-29 MED ORDER — METOCLOPRAMIDE HCL 5 MG/ML IJ SOLN
10.0000 mg | Freq: Once | INTRAMUSCULAR | Status: AC
  Administered 2016-11-29: 10 mg via INTRAVENOUS
  Filled 2016-11-29: qty 2

## 2016-11-29 MED ORDER — DEXAMETHASONE SODIUM PHOSPHATE 10 MG/ML IJ SOLN
10.0000 mg | Freq: Once | INTRAMUSCULAR | Status: AC
  Administered 2016-11-29: 10 mg via INTRAVENOUS
  Filled 2016-11-29: qty 1

## 2016-11-29 MED ORDER — DIPHENHYDRAMINE HCL 50 MG/ML IJ SOLN
50.0000 mg | Freq: Once | INTRAMUSCULAR | Status: AC
  Administered 2016-11-29: 50 mg via INTRAVENOUS
  Filled 2016-11-29: qty 1

## 2016-11-29 NOTE — ED Notes (Signed)
Pt c/o migraine type headache for the last two weeks that has been unrelieved by fioricet, percocet and topamax

## 2016-11-29 NOTE — ED Provider Notes (Signed)
Emergency Department Provider Note  By signing my name below, I, Soijett Blue, attest that this documentation has been prepared under the direction and in the presence of Briane Birden, Arlyss Repress, MD. Electronically Signed: Soijett Blue, ED Scribe. 11/29/16. 10:37 PM.  I have reviewed the triage vital signs and the nursing notes.   HISTORY  Chief Complaint Migraine   HPI Kirsten Walsh is a 56 y.o. female with a PMHx of migraine, HTN, who presents to the Emergency Department complaining of right sided HA onset 2 weeks ago. Pt reports associated right ear pain, right shoulder pain, chronic right sided weakness, photophobia, sound sensitivity, and tingling to right side. She notes that her chronic right sided weakness has been occurring for "years" and is typical with her headaches. Pt has tried Imitrex, Fioricet, percocet, and topramax with no relief of her symptoms. Pt right sided headache radiates to her right ear and right shoulder and that is typical of her headaches. She states that her headaches are worsened with temperature changes. She notes that she has had daily headaches for fourteen years and she is regularly evaluated by a neurologist. Denies numbness and any other symptoms.      Past Medical History:  Diagnosis Date  . Chest pain   . Chronic lower back pain    "right side to mid back" (02/14/2013)  . Gall stones   . Hypertension   . Kidney stone ~ 2006  . Migraine    "qd" (02/14/2013)    Patient Active Problem List   Diagnosis Date Noted  . Healthcare maintenance 08/09/2016  . Chronic daily headache 09/02/2015  . 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 Faintness 09/02/2015  . Right arm weakness 08/05/2015  . Edema of right arm and right leg 09/07/2014  . IUD contraception 07/18/2014  . History of anaphylaxis 06/26/2014  . Intractable migraine with status migrainosus 06/10/2014  . Intractable migraine without aura and with status migrainosus   . Bradycardia 02/18/2013  .  Cardiomyopathy 10/20/2011  . Decreased ejection fraction 10/05/2011  . Leg swelling 09/23/2011  . Complex regional pain syndrome 05/03/2011  . Chronic pain 03/29/2011  . NECK PAIN, RIGHT 02/03/2010  . ALLERGIC RHINITIS 01/30/2009  . DIZZINESS 07/16/2008  . Chronic migraine 04/26/2007  . OBESITY, NOS 08/18/2006  . HYPERTENSION, BENIGN SYSTEMIC 08/18/2006    Past Surgical History:  Procedure Laterality Date  . CYSTOSCOPY W/ STONE MANIPULATION  ~ 2006  . INTRAUTERINE DEVICE INSERTION     "initially put in in 04/2002; changed prn" (02/14/2013)  . LAPAROSCOPIC CHOLECYSTECTOMY  ~ 2004    Current Outpatient Rx  . Order #: [id number] Class: Historical Med  . Order #: [id number] Class: Normal  . Order #: [id number] Class: Normal  . Order #: [id number] Class: Normal  . Order #: [id number] Class: Print  . Order #: [id number] Class: Historical Med  . Order #: [id number] Class: Normal  . Order #: [id number] Class: Normal  . Order #: [id number] Class: Normal  . Order #: [id number] Class: Historical Med  . Order #: [id number] Class: Normal  . Order #: [id number] Class: Normal  . Order #: [id number] Class: Normal    Allergies Apple; Aspirin; Carrot [daucus carota]; and Haloperidol and related  Family History  Problem Relation Age of Onset  . Cancer Mother   . Cirrhosis Father   . Lupus Sister   . Diabetes Maternal Grandmother     Social History Social History  Substance Use Topics  . Smoking status: Never Smoker  . Smokeless tobacco: Never Used  . Alcohol use Yes  Comment: occ    Review of Systems Constitutional: No fever/chills Eyes: No visual changes. ENT: No sore throat. Cardiovascular: Denies chest pain. Respiratory: Denies shortness of breath. Gastrointestinal: No abdominal pain.  No nausea, no vomiting.  No diarrhea.  No constipation. Genitourinary: Negative for dysuria. Musculoskeletal: Negative for back pain. Skin: Negative for rash. Neurological: positive for right sided  headaches. Positive for chronic right sided weakness. Positive for right sided tingling. Negative for numbness.    10-point ROS otherwise negative.  ____________________________________________   PHYSICAL EXAM:  VITAL SIGNS: ED Triage Vitals  Enc Vitals Group     BP 11/29/16 2121 (!) 159/82     Pulse Rate 11/29/16 2121 72     Resp 11/29/16 2121 20     Temp 11/29/16 2121 99.1 F (37.3 C)     Temp Source 11/29/16 2121 Oral     SpO2 11/29/16 2121 100 %     Weight 11/29/16 2120 213 lb (96.6 kg)     Height 11/29/16 2120 5\' 5"  (1.651 m)     Pain Score 11/29/16 2119 10   Constitutional: Alert and oriented. Well appearing and in no acute distress. Eyes: Conjunctivae are normal.  Head: Atraumatic. Nose: No congestion/rhinnorhea. Mouth/Throat: Mucous membranes are moist. Neck: No stridor.   Cardiovascular: Normal rate, regular rhythm. Good peripheral circulation. Grossly normal heart sounds.   Respiratory: Normal respiratory effort.  No retractions. Lungs CTAB. Gastrointestinal: Soft and nontender. No distention.  Musculoskeletal: No lower extremity tenderness nor edema. No gross deformities of extremities. Neurologic:  Normal speech and language. 4/5 RUE strength compared to left. 4+/5 RLE strength. Normal CN exam 2-12.  Skin:  Skin is warm, dry and intact. No rash noted. ____________________________________________   PROCEDURES  Procedure(s) performed:   Procedures  None ____________________________________________   INITIAL IMPRESSION / ASSESSMENT AND PLAN / ED COURSE  Pertinent labs & imaging results that were available during my care of the patient were reviewed by me and considered in my medical decision making (see chart for details).  Patient with chronic HA presents to the ED for evaluation of breakthrough pain unable to be managed at home. She has baseline right sided weakness. Follows with Neurology. HA is similar in quality and severity to chronic HA. No  indication for head imaging at this time with chronic HA and baseline neuro findings. Plan for migraine treatment and reassessment.   11:59 PM  Patient feeling better after multiple migraine medications. HA feels similar to prior headaches. Plan for discharge. She has a sober driver home. Discussed return precautions in detail.   At this time, I do not feel there is any life-threatening condition present. I have reviewed and discussed all results (EKG, imaging, lab, urine as appropriate), exam findings with patient. I have reviewed nursing notes and appropriate previous records.  I feel the patient is safe to be discharged home without further emergent workup. Discussed usual and customary return precautions. Patient and family (if present) verbalize understanding and are comfortable with this plan.  Patient will follow-up with their primary care provider. If they do not have a primary care provider, information for follow-up has been provided to them. All questions have been answered.  ____________________________________________  FINAL CLINICAL IMPRESSION(S) / ED DIAGNOSES  Final diagnoses:  Hemiplegic migraine without status migrainosus, not intractable     MEDICATIONS GIVEN DURING THIS VISIT:  Medications  sodium chloride 0.9 % bolus 1,000 mL (0 mLs Intravenous Stopped 11/29/16 2342)  ketorolac (TORADOL) 30 MG/ML injection 30 mg (30 mg  Intravenous Given 11/29/16 2252)  metoCLOPramide (REGLAN) injection 10 mg (10 mg Intravenous Given 11/29/16 2252)  diphenhydrAMINE (BENADRYL) injection 50 mg (50 mg Intravenous Given 11/29/16 2252)  dexamethasone (DECADRON) injection 10 mg (10 mg Intravenous Given 11/29/16 2251)  magnesium sulfate IVPB 1 g 100 mL (0 g Intravenous Stopped 11/29/16 2316)  morphine 4 MG/ML injection 4 mg (4 mg Intravenous Given 11/29/16 2342)     NEW OUTPATIENT MEDICATIONS STARTED DURING THIS VISIT:  None   Note:  This document was prepared using Dragon voice recognition  software and may include unintentional dictation errors.  Alona Bene, MD Emergency Medicine   I personally performed the services described in this documentation, which was scribed in my presence. The recorded information has been reviewed and is accurate.       Maia Plan, MD 11/30/16 [phone]

## 2016-11-29 NOTE — ED Triage Notes (Signed)
C/o migraine x 2 weeks-NAD-steady gait

## 2016-11-29 NOTE — Discharge Instructions (Signed)

## 2016-11-30 NOTE — ED Notes (Signed)
Pt verbalizes understanding of d/c instructions and denies any further needs at this time. 

## 2016-12-24 ENCOUNTER — Other Ambulatory Visit: Payer: Self-pay | Admitting: Family Medicine

## 2016-12-24 DIAGNOSIS — G43709 Chronic migraine without aura, not intractable, without status migrainosus: Secondary | ICD-10-CM

## 2016-12-24 DIAGNOSIS — IMO0002 Reserved for concepts with insufficient information to code with codable children: Secondary | ICD-10-CM

## 2016-12-25 NOTE — Telephone Encounter (Signed)
Please call patient and ask her to schedule an appointment to be seen. She would like a refill of topomax which was previously prescribed by neurology. The refill may be appropriate but as I have not yet met her it would be best for her to have an appointment before I prescribe this medication.  THank you

## 2016-12-30 NOTE — Telephone Encounter (Signed)
Tried to contact pt to inform her of below and phone only rang and then stopped with no option to LVM.  If pt calls back please inform her and assist her in scheduling an appointment. Kirsten Walsh, April D, [place]

## 2017-01-17 ENCOUNTER — Other Ambulatory Visit: Payer: Self-pay | Admitting: Family Medicine

## 2017-01-17 ENCOUNTER — Other Ambulatory Visit: Payer: Self-pay | Admitting: Neurology

## 2017-01-17 DIAGNOSIS — IMO0002 Reserved for concepts with insufficient information to code with codable children: Secondary | ICD-10-CM

## 2017-01-17 DIAGNOSIS — G43709 Chronic migraine without aura, not intractable, without status migrainosus: Secondary | ICD-10-CM

## 2017-01-24 ENCOUNTER — Other Ambulatory Visit: Payer: Self-pay | Admitting: Neurology

## 2017-01-24 ENCOUNTER — Other Ambulatory Visit: Payer: Self-pay | Admitting: Family Medicine

## 2017-01-24 DIAGNOSIS — G43709 Chronic migraine without aura, not intractable, without status migrainosus: Secondary | ICD-10-CM

## 2017-01-24 DIAGNOSIS — IMO0002 Reserved for concepts with insufficient information to code with codable children: Secondary | ICD-10-CM

## 2017-01-31 ENCOUNTER — Encounter: Payer: Self-pay | Admitting: Student

## 2017-01-31 ENCOUNTER — Ambulatory Visit (HOSPITAL_COMMUNITY)
Admission: RE | Admit: 2017-01-31 | Discharge: 2017-01-31 | Disposition: A | Discharge status: Home / Self Care | Payer: Medicaid Other | Source: Ambulatory Visit | Attending: Family Medicine | Admitting: Family Medicine

## 2017-01-31 ENCOUNTER — Ambulatory Visit (INDEPENDENT_AMBULATORY_CARE_PROVIDER_SITE_OTHER): Payer: Medicaid Other | Admitting: Student

## 2017-01-31 VITALS — BP 128/84 | HR 62 | Temp 98.6°F | Ht 65.0 in | Wt 205.6 lb

## 2017-01-31 DIAGNOSIS — R55 Syncope and collapse: Secondary | ICD-10-CM

## 2017-01-31 DIAGNOSIS — G43511 Persistent migraine aura without cerebral infarction, intractable, with status migrainosus: Secondary | ICD-10-CM

## 2017-01-31 MED ORDER — PROMETHAZINE HCL 25 MG/ML IJ SOLN
25.0000 mg | Freq: Once | INTRAMUSCULAR | Status: AC
Start: 2017-01-31 — End: 2017-01-31
  Administered 2017-01-31: 25 mg via INTRAMUSCULAR

## 2017-01-31 MED ORDER — DIPHENHYDRAMINE HCL 25 MG PO TABS: 25.0000 mg | ORAL_TABLET | Freq: Four times a day (QID) | ORAL | 0 refills | Status: DC | PRN

## 2017-01-31 MED ORDER — SUMATRIPTAN SUCCINATE 6 MG/0.5ML ~~LOC~~ SOLN
6.0000 mg | Freq: Once | SUBCUTANEOUS | Status: AC
  Administered 2017-01-31: 6 mg via SUBCUTANEOUS

## 2017-01-31 MED ORDER — CHLORPROMAZINE HCL 10 MG PO TABS: 10.0000 mg | ORAL_TABLET | Freq: Three times a day (TID) | ORAL | 0 refills | Status: DC

## 2017-01-31 MED ORDER — KETOROLAC TROMETHAMINE 60 MG/2ML IM SOLN
60.0000 mg | Freq: Once | INTRAMUSCULAR | Status: AC
  Administered 2017-01-31: 60 mg via INTRAMUSCULAR

## 2017-01-31 MED ORDER — SUMATRIPTAN SUCCINATE 100 MG PO TABS: 100.0000 mg | ORAL_TABLET | Freq: Once | ORAL | 1 refills | Status: DC

## 2017-01-31 MED ORDER — DEXAMETHASONE 4 MG PO TABS: 6.0000 mg | ORAL_TABLET | Freq: Two times a day (BID) | ORAL | 0 refills | Status: DC

## 2017-01-31 MED ORDER — DEXAMETHASONE SODIUM PHOSPHATE 10 MG/ML IJ SOLN
10.0000 mg | Freq: Once | INTRAMUSCULAR | Status: AC
  Administered 2017-01-31: 10 mg via INTRAMUSCULAR

## 2017-01-31 NOTE — Patient Instructions (Addendum)
It was great seeing you today! We have addressed the following issues today 1. Headache: we have increased your sumatriptan to 100 mg. We have also sent a prescription for dexamethasone, Compazine and Benadryl to your pharmacy. Some of these medications might make you sleepy and drowsy. We strongly recommend against driving, operating machinery or doing something that needs mental concentration while taking these medications. Please don't take the Compazine whith promethazine.  2.   Passing out: This is likely due to migraine. It could also be vasovagal (see below for more this). Please seek immediate care if this happens again, or if you have chest pain, trouble breathing, palpitation, weakness or numbness in the legs or your arms, difficulty speaking or other symptoms concerning to you.   If we did any lab work today, and the results require attention, either me or my nurse will get in touch with you. If everything is normal, you will get a letter in mail and a message via . If you don't hear from [place] in two weeks, please give [place] a call. Otherwise, we look forward to seeing you again at your next visit. If you have any questions or concerns before then, please call the clinic at [phone].  Please bring all your medications to every doctors visit  Sign up for My Chart to have easy access to your labs results, and communication with your Primary care physician.    Please check-out at the front desk before leaving the clinic.    Take Care,   Dr. Alanda Slim   Migraine Headache A migraine headache is a very strong throbbing pain on one side or both sides of your head. Migraines can also cause other symptoms. Talk with your doctor about what things may bring on (trigger) your migraine headaches. Follow these instructions at home: Medicines  Take over-the-counter and prescription medicines only as told by your doctor.  Do not drive or use heavy machinery while taking prescription pain  medicine.  To prevent or treat constipation while you are taking prescription pain medicine, your doctor may recommend that you: ? Drink enough fluid to keep your pee (urine) clear or pale yellow. ? Take over-the-counter or prescription medicines. ? Eat foods that are high in fiber. These include fresh fruits and vegetables, whole grains, and beans. ? Limit foods that are high in fat and processed sugars. These include fried and sweet foods. Lifestyle  Avoid alcohol.  Do not use any products that contain nicotine or tobacco, such as cigarettes and e-cigarettes. If you need help quitting, ask your doctor.  Get at least 8 hours of sleep every night.  Limit your stress. General instructions   Keep a journal to find out what may bring on your migraines. For example, write down: ? What you eat and drink. ? How much sleep you get. ? Any change in what you eat or drink. ? Any change in your medicines.  If you have a migraine: ? Avoid things that make your symptoms worse, such as bright lights. ? It may help to lie down in a dark, quiet room. ? Do not drive or use heavy machinery. ? Ask your doctor what activities are safe for you.  Keep all follow-up visits as told by your doctor. This is important. Contact a doctor if:  You get a migraine that is different or worse than your usual migraines. Get help right away if:  Your migraine gets very bad.  You have a fever.  You have a stiff  neck.  You have trouble seeing.  Your muscles feel weak or like you cannot control them.  You start to lose your balance a lot.  You start to have trouble walking.  You pass out (faint). This information is not intended to replace advice given to you by your health care provider. Make sure you discuss any questions you have with your health care provider. Document Released: 03/16/2008 Document Revised: 12/26/2015 Document Reviewed: 11/24/2015 Elsevier Interactive Patient Education  2017  [employer]   Vasovagal Syncope, Adult Syncope, which is commonly known as fainting or passing out, is a temporary loss of consciousness. It occurs when the blood flow to the brain is reduced. Vasovagal syncope, also called neurocardiogenic syncope, is a fainting spell that happens when blood flow to the brain is reduced because of a sudden drop in heart rate and blood pressure. Vasovagal syncope is usually harmless. However, you can get injured if you fall during a fainting spell. What are the causes? This condition is caused by a drop in heart rate and blood pressure, usually in response to a trigger. Many things and situations can trigger an episode, including:  Pain.  Fear.  The sight of blood. This may occur during medical procedures, such as when blood is being drawn from a vein.  Common activities, such as coughing, swallowing, stretching, or going to the bathroom.  Emotional stress.  Being in a confined space.  Prolonged standing, especially in a warm environment.  Lack of sleep or rest.  Not eating for a long time.  Not drinking enough liquids.  Recent illness.  Drinking alcohol.  Taking drugs that affect blood pressure, such as marijuana, cocaine, opiates, or inhalants.  What are the signs or symptoms? Before a fainting episode, you may:  Feel dizzy or light-headed.  Become pale.  Sense that you are going to faint.  Feel like the room is spinning.  Only see directly ahead (tunnel vision).  Feel sick to your stomach (nauseous).  See spots.  Slowly lose vision.  Hear ringing in your ears.  Have a headache.  Feel warm and sweaty.  Feel a sensation of pins and needles.  During the fainting spell, you may twitch or make jerky movements. Fainting spells usually last no longer than a few minutes before you wake up. If you get up too quickly before your body can recover, you may faint again. How is this diagnosed? This condition is diagnosed based  on your symptoms, your medical history, and a physical exam. Tests may be done to rule out other causes of fainting. Tests may include:  Blood tests.  Heart tests, such as an electrocardiogram (ECG), echocardiogram, or electrophysiology study.  A test to check your response to changes in position (tilt table test).  How is this treated? Usually, treatment is not needed for this condition. Your health care provider may suggest ways to help prevent fainting episodes. These may include:  Drinking additional fluids if you are exposed to a trigger.  Sitting or lying down if you notice signs that an episode is coming.  If your fainting spells continue, your health care provider may recommend that you:  Take medicines to prevent fainting or to help reduce further episodes of fainting.  Do certain exercises.  Wear compression stockings.  Have surgery to place a pacemaker in your body (rare).  Follow these instructions at home:  Learn to identify the signs that an episode is coming.  Sit or lie down at the first sign  of a fainting spell. If you sit down, put your head down between your legs. If you lie down, swing your legs up in the air to increase blood flow to the brain.  Avoid hot tubs and saunas.  Avoid standing for a long time. If you have to stand for a long time, try: ? Crossing your legs. ? Flexing and stretching your leg muscles. ? Squatting. ? Moving your legs. ? Bending over.  Drink enough fluid to keep your urine clear or pale yellow.  Make changes to your diet that your health care provider recommends. You may be told to: ? Avoid caffeine. ? Eat more salt.  Take over-the-counter and prescription medicines only as told by your health care provider. Contact a health care provider if:  You continue to have fainting spells despite treatment.  You faint more often despite treatment.  You lose consciousness for more than a few minutes.  You faint during or after  exercising or after being startled.  You have twitching or jerky movements for longer than a few seconds during a fainting spell.  You have an episode of twitching or jerky movements without fainting. Get help right away if:  A fainting spell leads to an injury or bleeding.  You have new symptoms that occur with the fainting spells, such as: ? Shortness of breath. ? Chest pain. ? Irregular heartbeat.  You twitch or make jerky movements for more than 5 minutes.  You twitch or make jerky movements during more than one fainting spell. This information is not intended to replace advice given to you by your health care provider. Make sure you discuss any questions you have with your health care provider. Document Released: 05/24/2012 Document Revised: 11/19/2015 Document Reviewed: 04/05/2015 Elsevier Interactive Patient Education  [employer].

## 2017-02-01 ENCOUNTER — Telehealth: Payer: Self-pay | Admitting: *Deleted

## 2017-02-01 NOTE — Telephone Encounter (Signed)
[facility]. Formulary and PA form placed in provider box for completion. Clovis PuMartin, Tamika L, RN

## 2017-02-01 NOTE — Assessment & Plan Note (Signed)
This is a recurrent issue. Temporal association with bathroom use and the prodrome is suggestive for vasovagal. She had extensive cardiac work up in the past, and they were non-revealing. Vital signs within normal limit. EKG in the office is NSR. Her neuro exam is within normal limit. She has no focal deficit. Wonder if this is related to her migraine. Will manage her migraine, nausea and vomiting as above.

## 2017-02-01 NOTE — Progress Notes (Signed)
Subjective:    Kirsten Walsh is a 56 y.o. old female here for syncope and headache  HPI Syncope: happened about a week ago. She was sitting on commode when she felt "hot" and vomited and passed out. The next thing she remember was that she was on the floor in the bathroom. Not sure how long she was down. When she got up, she felt throbbing headache frontal headache, neck pain and "silver sparks". She has history of migraine headache. She says she had nausea, emesis and poor appetite for three to four days leading to her syncopal episode. She denies focal weakness, numbness or tingling. Patient has history of recurrent syncope in the past. She was seen by cardiologist about a year ago. Thirty day monitor without abnormal rhythm or significant pause at that time. Her Echo was also normal. She also reports a chocking sensation. Today, she denies chest pain, palpitation or shortness of breath.    Headache: this has been going on for about a week now. she has history of migraine headache with aura. Headache is throbbing in nature, more over left side. Headache is frontal bilaterally. She is on multiple medication for migraine. None of her medications are helping currently. She reports nausea, emesis, photophobia and phonophobia. Denies fever or chills. She denies neck stiffness but admits neck pain since syncopal episode. She has been followed by neurologist for this. Past work up including CT head and MRI brain are within normal.  PMH/Problem List: has OBESITY, NOS; Chronic migraine; HYPERTENSION, BENIGN SYSTEMIC; ALLERGIC RHINITIS; NECK PAIN, RIGHT; DIZZINESS; Chronic pain; Complex regional pain syndrome; Leg swelling; Decreased ejection fraction; Cardiomyopathy; Bradycardia; Intractable migraine without aura and with status migrainosus; Intractable migraine with status migrainosus; History of anaphylaxis; IUD contraception; Edema of right arm and right leg; Right arm weakness; Chronic daily headache; Basilar  migraine; Syncopal episodes; and Healthcare maintenance on her problem list.   has a past medical history of Chest pain; Chronic lower back pain; Gall stones; Hypertension; Kidney stone (~ 2006); and Migraine.  FH:  Family History  Problem Relation Age of Onset  . Cancer Mother   . Cirrhosis Father   . Lupus Sister   . Diabetes Maternal Grandmother     SH Social History  Substance Use Topics  . Smoking status: Never Smoker  . Smokeless tobacco: Never Used  . Alcohol use Yes     Comment: occ    Review of Systems Review of systems negative except for pertinent positives and negatives in history of present illness above.     Objective:     Vitals:   01/31/17 1104  BP: 128/84  Pulse: 62  Temp: 98.6 F (37 C)  TempSrc: Oral  SpO2: 99%  Weight: 205 lb 9.6 oz (93.3 kg)  Height: 5\' 5"  (1.651 m)    Physical Exam GEN: appears well, no apparent distress. Head: normocephalic and atraumatic, mild tenderness over hier left temple Eyes: Limited eye exam due to photophobia.  Oropharynx: mmm without erythema or exudation HEM: negative for cervical or periauricular lymphadenopathies CVS: RRR, nl S1&S2, no murmurs, no edema RESP: no IWOB, good air movement bilaterally, CTAB SKIN: no apparent skin lesion NEURO: Awake, alert and oriented appropriately. Cranial nerves II-XII intact, motor 5/5 in all muscle groups of UE and LE bilaterally, normal tone, light sensation intact in all dermatomes of upper and lower ext bilaterally, no pronator drift, biceps, patellar and reflexes 1+ bilaterally, finger to nose intact, gait is normal PSYCH: appears anxious    Assessment and  Plan:  Intractable migraine with status migrainosus Migraine cocktail of Imitrex 6 mg [place],  toradol 60 mg IM, phenergan 25 mg IM, and decadron 10 mg IM given in clinic today with improvement in her symptoms. Neuro exam within normal limit. Increased her Imitrex to 100 mg. Gave Rx for Chlorpromazine, diphenhydramine and  dexamethasone. Advised her not to take chlorpromazine with promethazine. Warned about the sedative side effects of these medications and cautioned her against driving or operations that needs high mental engagement. Encouraged her to follow up with neuro.  Syncopal episodes This is a recurrent issue. Temporal association with bathroom use and the prodrome is suggestive for vasovagal. She had extensive cardiac work up in the past, and they were non-revealing. Vital signs within normal limit. EKG in the office is NSR. Her neuro exam is within normal limit. She has no focal deficit. Wonder if this is related to her migraine. Will manage her migraine, nausea and vomiting as above.  Orders Placed This Encounter  Procedures  . POCT SEDIMENTATION RATE  . EKG 12-Lead  . Electrocardiogram report   Meds ordered this encounter  Medications  . SUMAtriptan (IMITREX) 100 MG tablet    Sig: Take 1 tablet (100 mg total) by mouth once. May repeat in 2 hours if headache persists or recurs.    Dispense:  10 tablet    Refill:  1  . chlorproMAZINE (THORAZINE) 10 MG tablet    Sig: Take 1 tablet (10 mg total) by mouth 3 (three) times daily.    Dispense:  10 tablet    Refill:  0  . diphenhydrAMINE (BENADRYL) 25 MG tablet    Sig: Take 1 tablet (25 mg total) by mouth every 6 (six) hours as needed.    Dispense:  30 tablet    Refill:  0  . dexamethasone (DECADRON) 4 MG tablet    Sig: Take 1.5 tablets (6 mg total) by mouth 2 (two) times daily with a meal.    Dispense:  6 tablet    Refill:  0   Return if symptoms worsen or fail to improve.  Almon Hercules, MD 02/01/17 Pager: [phone]   Discussed patient with Dr. Perley Jain.

## 2017-02-01 NOTE — Telephone Encounter (Signed)
PA is pending per [place] [facility].  Anay Walter L, RN  

## 2017-02-01 NOTE — Telephone Encounter (Signed)
Reviewed, completed, and signed form.  Note routed to RN team inbasket and placed completed form in Clinic RN's office (wall pocket above desk).  Taye T Gonfa, MD  

## 2017-02-01 NOTE — Assessment & Plan Note (Addendum)
Migraine cocktail of Imitrex 6 mg [place],  toradol 60 mg IM, phenergan 25 mg IM, and decadron 10 mg IM given in clinic today with improvement in her symptoms. Neuro exam within normal limit. Increased her Imitrex to 100 mg. Gave Rx for Chlorpromazine, diphenhydramine and dexamethasone. Advised her not to take chlorpromazine with promethazine. Warned about the sedative side effects of these medications and cautioned her against driving or operations that needs high mental engagement. Encouraged her to follow up with neuro.

## 2017-02-02 NOTE — Telephone Encounter (Signed)
PA for sumatriptan 100 mg was denied via [place] [facility].  Clovis PuMartin, Tamika L, RN

## 2017-02-03 NOTE — Telephone Encounter (Signed)
Called and talked to patient about prior to radiation for his sumatriptan 100 mg. Her insurance is declining the 100 mg. Patient reports feeling well from migraine standpoint. She'll have a prescription for the 25 mg tablets. I advised her to take 2 of that as needed for migraine. Patient voiced understanding and agrees

## 2017-02-09 ENCOUNTER — Other Ambulatory Visit: Payer: Self-pay | Admitting: Family Medicine

## 2017-02-09 DIAGNOSIS — IMO0002 Reserved for concepts with insufficient information to code with codable children: Secondary | ICD-10-CM

## 2017-02-09 DIAGNOSIS — G43709 Chronic migraine without aura, not intractable, without status migrainosus: Secondary | ICD-10-CM

## 2017-02-13 NOTE — Telephone Encounter (Signed)
Will refill x1 month. Please call and ask patient to make an appointment to be seen by PCP regarding further refills on topamax as she needs to be monitored with this medication.

## 2017-02-15 NOTE — Telephone Encounter (Signed)
Pt has appointment scheduled on 9/19 for this. Lamonte Sakai, April D, [place]

## 2017-02-15 NOTE — Telephone Encounter (Signed)
LVM for pt to call the office. If pt calls, please schedule an appt for medication refills. See note below.  Sunday Spillers, CMA

## 2017-03-07 ENCOUNTER — Encounter (HOSPITAL_COMMUNITY): Payer: Self-pay | Admitting: *Deleted

## 2017-03-07 ENCOUNTER — Ambulatory Visit (HOSPITAL_COMMUNITY)
Admission: EM | Admit: 2017-03-07 | Discharge: 2017-03-07 | Disposition: A | Discharge status: Home / Self Care | Payer: Medicaid Other | Attending: Family | Admitting: Family

## 2017-03-07 DIAGNOSIS — R1114 Bilious vomiting: Secondary | ICD-10-CM | POA: Diagnosis not present

## 2017-03-07 DIAGNOSIS — G43111 Migraine with aura, intractable, with status migrainosus: Secondary | ICD-10-CM

## 2017-03-07 MED ORDER — METOCLOPRAMIDE HCL 5 MG/ML IJ SOLN
10.0000 mg | Freq: Once | INTRAMUSCULAR | Status: AC
  Administered 2017-03-07: 10 mg via INTRAMUSCULAR

## 2017-03-07 MED ORDER — METOCLOPRAMIDE HCL 5 MG/ML IJ SOLN
INTRAMUSCULAR | Status: AC
  Filled 2017-03-07: qty 2

## 2017-03-07 MED ORDER — DIPHENHYDRAMINE HCL 50 MG/ML IJ SOLN
INTRAMUSCULAR | Status: AC
  Filled 2017-03-07: qty 1

## 2017-03-07 MED ORDER — DEXAMETHASONE SODIUM PHOSPHATE 10 MG/ML IJ SOLN
INTRAMUSCULAR | Status: AC
  Filled 2017-03-07: qty 1

## 2017-03-07 MED ORDER — DEXAMETHASONE SODIUM PHOSPHATE 10 MG/ML IJ SOLN
10.0000 mg | Freq: Once | INTRAMUSCULAR | Status: AC
  Administered 2017-03-07: 10 mg via INTRAMUSCULAR

## 2017-03-07 MED ORDER — DIPHENHYDRAMINE HCL 50 MG/ML IJ SOLN
50.0000 mg | Freq: Once | INTRAMUSCULAR | Status: AC
  Administered 2017-03-07: 50 mg via INTRAMUSCULAR

## 2017-03-07 NOTE — ED Triage Notes (Signed)
Patient reports daily history of migraine headaches, states however over the last 5 days here headache has been worse. Patient reports vomiting with photophobia.

## 2017-03-07 NOTE — Discharge Instructions (Signed)
You were given a shot of a steroid (Decadron), Benadryl to help with sleep and to minimize any side effects from the other injections, as well as Reglan to help with nausea. Recommend follow-up with your PCP and pain center in 2 days if minimal improvement or go to ER if pain level persists or worsens.

## 2017-03-07 NOTE — ED Provider Notes (Signed)
[facility]    CSN: [id number] Arrival date & time: 03/07/17  1843     History   Chief Complaint Chief Complaint  Patient presents with  . Migraine  . Emesis    HPI Kirsten Walsh is a 56 y.o. female.   56 year old female presents with worsening of her daily migraines for the past 5 days. Has long-standing history of migraine headaches and HTN- takes 2 blood pressure medications and is currently on Topamax, Clonidine and Naproxen for headache prevention. She also takes Imitrex, Fioricet, and Percocet for pain as well as Zofran, Phenergan tablets and suppositories for migraine induced vomiting. She is uncertain what has triggered her headaches to be worse in the past few days but may be due to weather (low pressure- passage of [facility]). She currently sees a pain specialist but headaches are not well-managed. Her neurologist was on maternity leave but has returned so she is trying to make an appointment for follow-up care in the next few weeks. [facility] does not accept [facility] insurance. She goes to school as well as works part-time but indicates no change in stress lately. She has had various combinations of medications to try to help an acute migraine attack with varying success. The last headache combination she had was Decadron, Benadryl and Reglan which seemed to help some. Last ER or PCP visit for migraine management was about 1 month ago. Today she is also experiencing photophobia, nausea, vomiting and slight aura (sees white spots/stars). She denies any loss of vision, chest pain, difficulty breathing or dizziness.    The history is provided by the patient.    Past Medical History:  Diagnosis Date  . Chest pain   . Chronic lower back pain    "right side to mid back" (02/14/2013)  . Gall stones   . Hypertension   . Kidney stone ~ 2006  . Migraine    "qd" (02/14/2013)    Patient Active Problem List   Diagnosis Date Noted  .  Healthcare maintenance 08/09/2016  . Chronic daily headache 09/02/2015  . 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 Syncopal episodes 09/02/2015  . Right arm weakness 08/05/2015  . Edema of right arm and right leg 09/07/2014  . IUD contraception 07/18/2014  . History of anaphylaxis 06/26/2014  . Intractable migraine with status migrainosus 06/10/2014  . Intractable migraine without aura and with status migrainosus   . Bradycardia 02/18/2013  . Cardiomyopathy 10/20/2011  . Decreased ejection fraction 10/05/2011  . Leg swelling 09/23/2011  . Complex regional pain syndrome 05/03/2011  . Chronic pain 03/29/2011  . NECK PAIN, RIGHT 02/03/2010  . ALLERGIC RHINITIS 01/30/2009  . DIZZINESS 07/16/2008  . Chronic migraine 04/26/2007  . OBESITY, NOS 08/18/2006  . HYPERTENSION, BENIGN SYSTEMIC 08/18/2006    Past Surgical History:  Procedure Laterality Date  . CYSTOSCOPY W/ STONE MANIPULATION  ~ 2006  . INTRAUTERINE DEVICE INSERTION     "initially put in in 04/2002; changed prn" (02/14/2013)  . LAPAROSCOPIC CHOLECYSTECTOMY  ~ 2004    OB History    No data available       Home Medications    Prior to Admission medications   Medication Sig Start Date End Date Taking? Authorizing Provider  butalbital-acetaminophen-caffeine (FIORICET, ESGIC) 50-325-40 MG per tablet Take 1 tablet by mouth every 6 (six) hours as needed. For migraine 11/25/14   [provider]  chlorproMAZINE (THORAZINE) 10 MG tablet Take 1 tablet (10 mg total) by mouth 3 (three)  times daily. 01/31/17   Almon Hercules, MD  cloNIDine (CATAPRES) 0.1 MG tablet TAKE 1 TABLET BY MOUTH TWICE DAILY 04/26/16   Ardith Dark, MD  diphenhydrAMINE (BENADRYL) 25 MG tablet Take 1 tablet (25 mg total) by mouth every 6 (six) hours as needed. 01/31/17   Almon Hercules, MD  EPIPEN 2-PAK 0.3 MG/0.3ML SOAJ injection INJECT 1 SYRINGE IN THE MUSCLE ONCE 02/03/15   Ardith Dark, MD  hydrochlorothiazide (HYDRODIURIL) 25 MG tablet Take 1 tablet (25 mg  total) by mouth 2 (two) times daily. 02/11/16   Ardith Dark, MD  naproxen (NAPROSYN) 250 MG tablet Take 1 tablet (250 mg total) by mouth 2 (two) times daily with a meal. 09/24/15   Everlene Farrier, PA-C  ondansetron (ZOFRAN) 4 MG tablet Take 4 mg by mouth every 8 (eight) hours as needed for nausea or vomiting. For nausea/vomiting. 10/20/14   [provider]  oxyCODONE (OXY IR/ROXICODONE) 5 MG immediate release tablet TK 1 T PO Q 8 H 01/17/17   [provider]  promethazine (PHENERGAN) 25 MG suppository Place 1 suppository (25 mg total) rectally every 6 (six) hours as needed for nausea or vomiting. 08/09/16   Ardith Dark, MD  promethazine (PHENERGAN) 25 MG tablet Take 1 tablet (25 mg total) by mouth every 8 (eight) hours as needed for nausea or vomiting. 08/09/16   Ardith Dark, MD  propranolol ER (INDERAL LA) 80 MG 24 hr capsule TAKE 1 CAPSULE(80 MG) BY MOUTH DAILY 01/25/17   Howard Pouch, MD  sennosides-docusate sodium (SENOKOT-S) 8.6-50 MG tablet Take 1 tablet by mouth daily as needed for constipation.    [provider]  SUMAtriptan (IMITREX) 100 MG tablet Take 1 tablet (100 mg total) by mouth once. May repeat in 2 hours if headache persists or recurs. 01/31/17 01/31/17  Almon Hercules, MD  topiramate (TOPAMAX) 100 MG tablet TAKE 1 AND 1/2 TABLETS(150 MG) BY MOUTH TWICE DAILY 02/13/17   Howard Pouch, MD  verapamil (CALAN-SR) 120 MG CR tablet Take 1 tablet (120 mg total) by mouth at bedtime. 09/02/15   Van Clines, MD    Family History Family History  Problem Relation Age of Onset  . Cancer Mother   . Cirrhosis Father   . Lupus Sister   . Diabetes Maternal Grandmother     Social History Social History  Substance Use Topics  . Smoking status: Never Smoker  . Smokeless tobacco: Never Used  . Alcohol use Yes     Comment: occ     Allergies   Apple; Aspirin; Carrot [daucus carota]; and Haloperidol and related   Review of Systems Review of Systems    Constitutional: Positive for appetite change, chills and fatigue. Negative for diaphoresis and fever.  HENT: Negative for congestion, sinus pressure and sore throat.   Eyes: Positive for photophobia. Negative for pain, discharge, redness, itching and visual disturbance.  Respiratory: Negative for cough, chest tightness, shortness of breath and wheezing.   Cardiovascular: Negative for chest pain and palpitations.  Gastrointestinal: Positive for nausea and vomiting. Negative for abdominal pain, constipation and diarrhea.  Genitourinary: Negative for dysuria, flank pain and hematuria.  Musculoskeletal: Positive for myalgias. Negative for arthralgias, back pain, neck pain and neck stiffness.  Skin: Negative for rash and wound.  Allergic/Immunologic: Positive for food allergies.  Neurological: Positive for light-headedness and headaches. Negative for dizziness, tremors, seizures, syncope, facial asymmetry, speech difficulty, weakness and numbness.  Hematological: Negative for adenopathy. Does not bruise/bleed easily.  Psychiatric/Behavioral: Positive for sleep disturbance.     Physical Exam Triage Vital Signs ED Triage Vitals  Enc Vitals Group     BP 03/07/17 2020 (!) 148/91     Pulse Rate 03/07/17 2020 86     Resp 03/07/17 2020 18     Temp 03/07/17 2020 98.6 F (37 C)     Temp Source 03/07/17 2020 Oral     SpO2 03/07/17 2020 100 %     Weight --      Height --      Head Circumference --      Peak Flow --      Pain Score 03/07/17 2018 10     Pain Loc --      Pain Edu? --      Excl. in GC? --    No data found.   Updated Vital Signs BP (!) 148/91 (BP Location: Left Arm)   Pulse 86   Temp 98.6 F (37 C) (Oral)   Resp 18   SpO2 100%   Visual Acuity Right Eye Distance:   Left Eye Distance:   Bilateral Distance:    Right Eye Near:   Left Eye Near:    Bilateral Near:     Physical Exam  Constitutional: She is oriented to person, place, and time. She appears well-developed  and well-nourished. She is cooperative. She appears ill. No distress.  She appears in pain and is wrapped up in a blanket due to chills  HENT:  Head: Normocephalic and atraumatic.    Right Ear: Hearing, tympanic membrane, external ear and ear canal normal.  Left Ear: Hearing, tympanic membrane, external ear and ear canal normal.  Nose: Nose normal.  Mouth/Throat: Uvula is midline, oropharynx is clear and moist and mucous membranes are normal.  No swelling seen. Tender along entire left side of face from cheek to frontal area.   Eyes: Pupils are equal, round, and reactive to light. Conjunctivae and EOM are normal. Right conjunctiva is not injected. Left conjunctiva is not injected.  Very sensitive to light  Neck: Normal range of motion. Neck supple.  Cardiovascular: Normal rate, regular rhythm and normal heart sounds.   No murmur heard. Pulmonary/Chest: Effort normal and breath sounds normal. No respiratory distress. She has no decreased breath sounds. She has no wheezes. She has no rhonchi.  Musculoskeletal: Normal range of motion.  Lymphadenopathy:    She has no cervical adenopathy.  Neurological: She is alert and oriented to person, place, and time. She has normal strength and normal reflexes. No cranial nerve deficit or sensory deficit. She displays a negative Romberg sign.  Skin: Skin is warm and dry. Capillary refill takes less than 2 seconds.  Psychiatric: She has a normal mood and affect. Her speech is normal and behavior is normal. Thought content normal. Cognition and memory are normal.     UC Treatments / Results  Labs (all labs ordered are listed, but only abnormal results are displayed) Labs Reviewed - No data to display  EKG  EKG Interpretation None       Radiology No results found.  Procedures Procedures (including critical care time)  Medications Ordered in UC Medications  dexamethasone (DECADRON) injection 10 mg (10 mg Intramuscular Given 03/07/17 2131)    diphenhydrAMINE (BENADRYL) injection 50 mg (50 mg Intramuscular Given 03/07/17 2133)  metoCLOPramide (REGLAN) injection 10 mg (10 mg Intramuscular Given 03/07/17 2129)     Initial Impression / Assessment and Plan / UC Course  I have reviewed the  triage vital signs and the nursing notes.  Pertinent labs & imaging results that were available during my care of the patient were reviewed by me and considered in my medical decision making (see chart for details).    Discussed with patient that we can try another "headache cocktail" to try to help decrease her pain. Gave Decadron , Benadryl  and Reglan  IM. Discussed that she needs to contact her Neurologist to schedule an appointment for further evaluation to try to optimize headache management. Encouraged to continue other medications as prescribed for HTN and headache prevention. Note written for work and school. Follow-up with her Neurologist if possible within 1 week or go to ER sooner if headache pain does not improve or worsens.   Final Clinical Impressions(s) / UC Diagnoses   Final diagnoses:  Intractable migraine with aura with status migrainosus  Bilious vomiting with nausea    New Prescriptions Discharge Medication List as of 03/07/2017  9:34 PM       Controlled Substance Prescriptions [place] Controlled Substance Registry consulted? Yes, I have consulted the [place] Controlled Substances Registry for this patient. She has an active Rx for Percocet #90 that she received on 02/22/17. She receives about 90 Percocet each month. No narcotic or controlled medications written for this patient today. She needs to see her Pain Specialist or Neurologist for any additional medications.    Sudie Grumbling, NP 03/08/17 [phone]

## 2017-03-09 ENCOUNTER — Telehealth: Payer: Self-pay

## 2017-03-09 ENCOUNTER — Ambulatory Visit: Payer: Medicaid Other | Admitting: Student in an Organized Health Care Education/Training Program

## 2017-03-09 NOTE — Progress Notes (Deleted)
   CC: ***  HPI: Kirsten Walsh is a 56 y.o. female with PMH significant for migraines who presents to [facility] today with *** of *** duration.  Refill of topomax.  HEADACHE   Onset: ***   Location: ***  Quality: *** Frequency: ***  Precipitating factors: ***  Prior treatment: ***  Associated Symptoms Nausea/vomiting: {YES/NO/WILD CARDS:18581}  Photophobia/phonophobia: {YES/NO/WILD CARDS:18581}  Tearing of eyes: {YES/NO/WILD CARDS:18581}  Sinus pain/pressure: {YES/NO/WILD CARDS:18581}  Family hx migraine: {YES/NO/WILD WGNFA:21308}  Personal stressors: {YES/NO/WILD CARDS:18581}  Relation to menstrual cycle: {YES/NO/WILD CARDS:18581}   Red Flags Fever: {YES/NO/WILD CARDS:18581}  Neck pain/stiffness: {YES/NO/WILD CARDS:18581}  Vision/speech/swallow/hearing difficulty: {YES/NO/WILD CARDS:18581}  Focal weakness/numbness: {YES/NO/WILD CARDS:18581}  Altered mental status: {YES/NO/WILD CARDS:18581}  Trauma: {YES/NO/WILD CARDS:18581}  New type of headache: {YES/NO/WILD CARDS:18581}  Anticoagulant use: {YES/NO/WILD MVHQI:69629}  H/o cancer/HIV/Pregnancy: {YES/NO/WILD BMWUX:32440}    Overdue health maintenance ***  Review of Symptoms:  See HPI for ROS.   CC, SH/smoking status, and VS noted.  Objective: There were no vitals taken for this visit. GEN: NAD, alert, cooperative, and pleasant.*** EYE: no conjunctival injection, pupils equally round and reactive to light ENMT: normal tympanic light reflex, no nasal polyps,no rhinorrhea, no pharyngeal erythema or exudates NECK: full ROM, no thyromegaly RESPIRATORY: clear to auscultation bilaterally with no wheezes, rhonchi or rales, good effort CV: RRR, no m/r/g, no peripheral edema GI: soft, non-tender, non-distended, no hepatosplenomegaly SKIN: warm and dry, no rashes or lesions NEURO: II-XII grossly intact, normal gait, peripheral sensation intact PSYCH: AAOx3, appropriate affect  Flu Vaccine: *** Tdap Vaccine: *** - every  29yrs - (<3 lifetime doses or unknown): all wounds -- look up need for Tetanus IG - (>=3 lifetime doses): clean/minor wound if >20yrs from previous; all other wounds if >74yrs from previous Zoster Vaccine: *** (those >50yo, once) Pneumonia Vaccine: *** (those w/ risk factors) - (<43yr) Both: Immunocompromised, cochlear implant, CSF leak, asplenic, sickle cell, Chronic Renal Failure - (<79yr) PPSV-23 only: Heart dz, lung disease, DM, tobacco abuse, alcoholism, cirrhosis/liver disease. - (>53yr): PPSV13 then PPSV23 in 6-12mths;  - (>26yr): repeat PPSV23 once if pt received prior to 54yo and 60yrs have passed  Assessment and plan:  No problem-specific Assessment & Plan notes found for this encounter.   No orders of the defined types were placed in this encounter.   No orders of the defined types were placed in this encounter.    Howard Pouch, MD,MS,  PGY2 03/09/2017 6:52 AM

## 2017-03-09 NOTE — Telephone Encounter (Signed)
Patient called and had to reschedule because of a conflict but really needs HCTZ, Propranolol, and  Topamax. I told her to call the pharmacy and see if she could get a few to hold her over until she could get here in October.Kirsten Walsh

## 2017-03-22 NOTE — Progress Notes (Signed)
CC: Visit for topomax refill  HPI: Kirsten Walsh is a 56 y.o. female with PMH significant for migraines who presents to [facility] today with headaches of 14 years duration.    Migraines/Headaches - daily for the last 14 years - always on the right side - wakes up and goes to bed with them every single day - worse with scents - sometimes nauseaus - sensitive to light - goes to pain center - takes percocet, fiorcet and zofran all for migraines - takes sumatriptan as needed, will take and then take again after 2 hours. Has passed out she thinks from taking so 4 pills. Only uses when really bad - takes topomax 1.5 pills BID for years  When I mentioned that it must be difficult for the patient to go to school while she is having headaches every day, she became tearful and was not happy with this question, stating I should tell her how wonderful it is that she goes to school despite her headaches.   HTN - controlled today -medications include HCTZ 25 mg which she takes BID, as well as clonidine 0.1 mg tablet BID - no CP - no vision changes - no palpitations - no cramping   Review of Symptoms:  See HPI for ROS.   CC, SH/smoking status, and VS noted.  Objective: BP 124/76   Pulse 84   Temp 98.5 F (36.9 C) (Oral)   Ht  (1.651 m)   Wt 206 lb (93.4 kg)   SpO2 99%   BMI 34.28 kg/m  GEN: NAD, alert, cooperative Head: [place]/AT, ttp over right  EYE: no conjunctival injection, pupils equally round and reactive to light RESPIRATORY: clear to auscultation bilaterally with no wheezes, rhonchi or rales, good effort CV: RRR, no m/r/g, no peripheral edema NEURO: II-XII grossly intact, normal gait, peripheral sensation intact PSYCH: AAOx3  Assessment and plan:  Intractable migraine without aura and with status migrainosus Her symptoms are not typical of migraines given duration and pattern of headaches. She does have temporal tenderness on the right with no vision changes or joint  pain. She states that pain management has done nerve conduction studies suggesting nerve damage in the temporal region.  -refer back to neurology -Refill Topamax -Did not refill sumatriptan, concerned that she does not take this correctly since she has headaches daily   HYPERTENSION, BENIGN SYSTEMIC - well-controlled on today's visit - Note HCTZ 50 mg daily; this can be decreased to 25 mg daily as there is no additional benefit to the higher dose - I recognized this twice a day dosing after the patient left, so called and she agrees to take the medication once daily. I updated the dosing in Epic. - Continue clonidine    Orders Placed This Encounter  Procedures  . Flu Vaccine QUAD 36+ mos IM  . Ambulatory referral to Neurology    Referral Priority:   Routine    Referral Type:   Consultation    Referral Reason:   Specialty Services Required    Requested Specialty:   Neurology    Number of Visits Requested:   1    Meds ordered this encounter  Medications  . cloNIDine (CATAPRES) 0.1 MG tablet    Sig: Take 1 tablet (0.1 mg total) by mouth 2 (two) times daily.    Dispense:  60 tablet    Refill:  11  . DISCONTD: hydrochlorothiazide (HYDRODIURIL) 25 MG tablet    Sig: Take 1 tablet (25 mg total) by mouth  2 (two) times daily.    Dispense:  60 tablet    Refill:  5  . topiramate (TOPAMAX) 100 MG tablet    Sig: TAKE 1 AND 1/2 TABLETS(150 MG) BY MOUTH TWICE DAILY    Dispense:  90 tablet    Refill:  3  . hydrochlorothiazide (HYDRODIURIL) 25 MG tablet    Sig: Take 1 tablet (25 mg total) by mouth daily.    Dispense:  60 tablet    Refill:  5     Howard Pouch, MD,MS,  PGY2 03/25/2017 5:01 PM

## 2017-03-23 ENCOUNTER — Ambulatory Visit (INDEPENDENT_AMBULATORY_CARE_PROVIDER_SITE_OTHER): Payer: Medicaid Other | Admitting: Student in an Organized Health Care Education/Training Program

## 2017-03-23 ENCOUNTER — Encounter: Payer: Self-pay | Admitting: Student in an Organized Health Care Education/Training Program

## 2017-03-23 VITALS — BP 124/76 | HR 84 | Temp 98.5°F | Ht 65.0 in | Wt 206.0 lb

## 2017-03-23 DIAGNOSIS — Z23 Encounter for immunization: Secondary | ICD-10-CM

## 2017-03-23 DIAGNOSIS — G43709 Chronic migraine without aura, not intractable, without status migrainosus: Secondary | ICD-10-CM | POA: Diagnosis not present

## 2017-03-23 DIAGNOSIS — I10 Essential (primary) hypertension: Secondary | ICD-10-CM | POA: Diagnosis not present

## 2017-03-23 DIAGNOSIS — G43819 Other migraine, intractable, without status migrainosus: Secondary | ICD-10-CM

## 2017-03-23 DIAGNOSIS — IMO0002 Reserved for concepts with insufficient information to code with codable children: Secondary | ICD-10-CM

## 2017-03-23 MED ORDER — TOPIRAMATE 100 MG PO TABS: ORAL_TABLET | ORAL | 3 refills | Status: DC

## 2017-03-23 MED ORDER — HYDROCHLOROTHIAZIDE 25 MG PO TABS: 25.0000 mg | ORAL_TABLET | Freq: Two times a day (BID) | ORAL | 5 refills | Status: DC

## 2017-03-23 MED ORDER — CLONIDINE HCL 0.1 MG PO TABS: 0.1000 mg | ORAL_TABLET | Freq: Two times a day (BID) | ORAL | 11 refills | Status: DC

## 2017-03-23 NOTE — Patient Instructions (Addendum)
It was a pleasure seeing you today in our clinic. Today we discussed your headaches and blood pressure. Here is the treatment plan we have discussed and agreed upon together:  A consult was placed to neurology at today's visit.  You will receive a call to schedule an appointment. If you do not receive a call within two weeks please call our office so we can place the consult again.   Our clinic's number is [phone]. Please call with questions or concerns about what we discussed today.  Be well, Dr. Mosetta Putt

## 2017-03-25 MED ORDER — HYDROCHLOROTHIAZIDE 25 MG PO TABS: 25.0000 mg | ORAL_TABLET | Freq: Every day | ORAL | 5 refills | Status: DC

## 2017-03-25 NOTE — Assessment & Plan Note (Signed)
-   well-controlled on today's visit - Note HCTZ 50 mg daily; this can be decreased to 25 mg daily as there is no additional benefit to the higher dose - I recognized this twice a day dosing after the patient left, so called and she agrees to take the medication once daily. I updated the dosing in Epic. - Continue clonidine

## 2017-03-25 NOTE — Assessment & Plan Note (Signed)
Her symptoms are not typical of migraines given duration and pattern of headaches. She does have temporal tenderness on the right with no vision changes or joint pain. She states that pain management has done nerve conduction studies suggesting nerve damage in the temporal region.  -refer back to neurology -Refill Topamax -Did not refill sumatriptan, concerned that she does not take this correctly since she has headaches daily

## 2017-03-27 ENCOUNTER — Other Ambulatory Visit: Payer: Self-pay | Admitting: Student in an Organized Health Care Education/Training Program

## 2017-06-27 ENCOUNTER — Ambulatory Visit: Payer: Medicaid Other | Admitting: Neurology

## 2017-06-27 ENCOUNTER — Encounter: Payer: Self-pay | Admitting: Neurology

## 2017-06-27 VITALS — BP 172/98 | HR 57 | Ht 65.0 in | Wt 210.0 lb

## 2017-06-27 DIAGNOSIS — G43711 Chronic migraine without aura, intractable, with status migrainosus: Secondary | ICD-10-CM | POA: Diagnosis not present

## 2017-06-27 MED ORDER — FREMANEZUMAB-VFRM 225 MG/1.5ML ~~LOC~~ SOSY: 1.0000 | PREFILLED_SYRINGE | SUBCUTANEOUS | 0 refills | Status: DC

## 2017-06-27 NOTE — Patient Instructions (Signed)
Magnesium citrate 428m to 6022mdaily, riboflavin 40065maily, Coenzyme Q 10 100m61mree times daily Consider joining Facebook group [facility].  "CGRP Migraine": Ajovy, Aimovig    Fremanezumab-vfrm: Patient drug information Copyright 1978787-267-1871[employer]omp, Inc. All rights reserved. (For additional information see "Fremanezumab-vfrm: Drug information") Brand Names: [place]  [place]ovy  What is this drug used for?   It is used to prevent migraine headaches.  What do I need to tell my doctor BEFORE I take this drug?   If you have an allergy to this drug or any part of this drug.   If you are allergic to any drugs like this one, any other drugs, foods, or other substances. Tell your doctor about the allergy and what signs you had, like rash; hives; itching; shortness of breath; wheezing; cough; swelling of face, lips, tongue, or throat; or any other signs.   This drug may interact with other drugs or health problems.   Tell your doctor and pharmacist about all of your drugs (prescription or OTC, natural products, vitamins) and health problems. You must check to make sure that it is safe for you to take this drug with all of your drugs and health problems. Do not start, stop, or change the dose of any drug without checking with your doctor.  What are some things I need to know or do while I take this drug?   Tell all of your health care providers that you take this drug. This includes your doctors, nurses, pharmacists, and dentists.   Allergic reactions have happened within hours and up to 1 month after this drug was given. Tell your doctor right away about any bad effects after getting this drug.   Tell your doctor if you are pregnant or plan on getting pregnant. You will need to talk about the benefits and risks of using this drug while you are pregnant.   Tell your doctor if you are breast-feeding. You will need to talk about any risks to your baby.  What are some side effects that  I need to call my doctor about right away?   WARNING/CAUTION: Even though it may be rare, some people may have very bad and sometimes deadly side effects when taking a drug. Tell your doctor or get medical help right away if you have any of the following signs or symptoms that may be related to a very bad side effect:   Signs of an allergic reaction, like rash; hives; itching; red, swollen, blistered, or peeling skin with or without fever; wheezing; tightness in the chest or throat; trouble breathing, swallowing, or talking; unusual hoarseness; or swelling of the mouth, face, lips, tongue, or throat.   Very bad irritation where the shot was given.  What are some other side effects of this drug?   All drugs may cause side effects. However, many people have no side effects or only have minor side effects. Call your doctor or get medical help if any of these side effects or any other side effects bother you or do not go away:   Irritation where the shot is given.   These are not all of the side effects that may occur. If you have questions about side effects, call your doctor. Call your doctor for medical advice about side effects.   You may report side effects to your national health agency.  How is this drug best taken?   Use this drug as ordered by your doctor. Read all information given to  you. Follow all instructions closely.   It is given as a shot into the fatty part of the skin on the top of the thigh, belly area, or upper arm.   If you will be giving yourself the shot, your doctor or nurse will teach you how to give the shot.   Follow how to use as you have been told by the doctor or read the package insert.   Wash your hands before use.   If stored in a refrigerator, let this drug come to room temperature before using it. Leave it at room temperature for at least 30 minutes. Do not heat this drug.   Do not shake.   Do not give into skin that is irritated, bruised, red, infected, or  scarred.   Do not give into the same place as another shot.   If your dose is more than 1 injection, you may give it in the same body part. Make sure you do not give injections in the same spot you used for the other injections.   Do not use if the solution is cloudy, leaking, or has particles.   This drug is colorless to a faint yellow. Do not use if the solution changes color.   Each prefilled syringe is for one use only.   Throw away needles in a needle/sharp disposal box. Do not reuse needles or other items. When the box is full, follow all local rules for getting rid of it. Talk with a doctor or pharmacist if you have any questions.  What do I do if I miss a dose?   Take a missed dose as soon as you think about it.   After taking a missed dose, start a new schedule based on when the dose is taken.  How do I store and/or throw out this drug?   Store in a refrigerator. Do not freeze.   Store in the carton to protect from light.   If needed, this drug can be left out at room temperature for up to 24 hours. Throw away drug if left at room temperature for more than 24 hours.   Protect from heat and sunlight.   Keep all drugs in a safe place. Keep all drugs out of the reach of children and pets.   Throw away unused or expired drugs. Do not flush down a toilet or pour down a drain unless you are told to do so. Check with your pharmacist if you have questions about the best way to throw out drugs. There may be drug take-back programs in your area.  General drug facts   If your symptoms or health problems do not get better or if they become worse, call your doctor.   Do not share your drugs with others and do not take anyone else's drugs.   Keep a list of all your drugs (prescription, natural products, vitamins, OTC) with you. Give this list to your doctor.   Talk with the doctor before starting any new drug, including prescription or OTC, natural products, or vitamins.   Some drugs may have  another patient information leaflet. If you have any questions about this drug, please talk with your doctor, nurse, pharmacist, or other health care provider.   If you think there has been an overdose, call your poison control center or get medical care right away. Be ready to tell or show what was taken, how much, and when it happened.  Use of UpToDate is subject to the Subscription  and License Agreement. Topic [id number] Version 1.0

## 2017-06-27 NOTE — Progress Notes (Signed)
[facility]    Provider:  Dr Lucia Gaskins Referring Provider: Howard Pouch, MD Primary Care Physician:  Howard Pouch, MD, Gerilyn Pilgrim  CC:  Chronic Migraines  HPI:  Kirsten Walsh is a 57 y.o. female here as a referral from Dr. Mosetta Putt for migraines. Started 14 years. Daily for 14 years. On the right side, continuous, wakes up with the headaches and goes to bed with them, photo/phono/osmophobia, pounding and throbbing, goes to pain center and takes percocet and fioricet for migrai.es, also sumatriptan, on multiple migriane meds for years. She has seen neurologists in the past. She is at a 6-7/10 in pain, on average she is 3-4/10, constantly in pain. She [facility] Neale [facility], has also been to [facility]. She went to integrative therapy, she has tried everything, she has been to [facility] neurolog[facility]. She takes takes percocet and fioricet multiple times a day from pain management. Sleep evaluation was negative for OSA. No other focal neurologic deficits, associated symptoms, inciting events or modifiable factors. Here with husband who also provides information.    She has seen Dr. Karel Jarvis for syncopal episodes. Reviewed notes.  Medications currently on: Fioricet, Zofran, Oxycodone, phenergan, naprosyn, propranolol, topiramate, verapamil,DHE,  diamox fioricet, flexeril, verapamil, tramadol, zoloft, lyrica, botox x 4.    Reviewed notes, labs and imaging from outside physicians, which showed:  CBC normal. CMP with creatinine 1.19.   CT head 12/2013 and [facility] brain 01/2013 unremarkable (personally reviewed images)  hgba1c 6.0  Orders Placed This Encounter  Procedures  . Ambulatory referral to Neurology     Review of Systems: Patient complains of symptoms per HPI as well as the following symptoms: headache, intractable. Pertinent negatives and positives per HPI. All others negative.   Social History   Socioeconomic History  . Marital status: Single     Spouse name: Not on file  . Number of children: 4  . Years of education: Not on file  . Highest education level: Associate degree: academic program  Social Needs  . Financial resource strain: Not on file  . Food insecurity - worry: Not on file  . Food insecurity - inability: Not on file  . Transportation needs - medical: Not on file  . Transportation needs - non-medical: Not on file  Occupational History  . Not on file  Tobacco Use  . Smoking status: Never Smoker  . Smokeless tobacco: Never Used  Substance and Sexual Activity  . Alcohol use: Yes    Comment: occ  . Drug use: No  . Sexual activity: Not on file  Other Topics Concern  . Not on file  Social History Narrative   Lives at home with a child   Right handed   Drinks occasional caffeine    Family History  Problem Relation Age of Onset  . Cancer Mother   . Pancreatic cancer Mother   . Cirrhosis Father   . Lupus Sister   . Diabetes Maternal Grandmother     Past Medical History:  Diagnosis Date  . Chest pain   . Chronic lower back pain    "right side to mid back" (02/14/2013)  . Gall stones   . Hypertension   . Kidney stone ~ 2006  . Migraine    "qd" (02/14/2013)    Past Surgical History:  Procedure Laterality Date  . CYSTOSCOPY W/ STONE MANIPULATION  ~ 2006  . INTRAUTERINE DEVICE INSERTION     "initially put in in 04/2002; changed prn" (02/14/2013)  . LAPAROSCOPIC  CHOLECYSTECTOMY  ~ 2004    Current Outpatient Medications  Medication Sig Dispense Refill  . butalbital-acetaminophen-caffeine (FIORICET, ESGIC) 50-325-40 MG per tablet Take 1 tablet by mouth every 6 (six) hours as needed. For migraine  0  . chlorproMAZINE (THORAZINE) 10 MG tablet Take 1 tablet (10 mg total) by mouth 3 (three) times daily. 10 tablet 0  . cloNIDine (CATAPRES) 0.1 MG tablet Take 1 tablet (0.1 mg total) by mouth 2 (two) times daily. 60 tablet 11  . diphenhydrAMINE (BENADRYL) 25 MG tablet Take 1 tablet (25 mg total) by mouth  every 6 (six) hours as needed. 30 tablet 0  . EPIPEN 2-PAK 0.3 MG/0.3ML SOAJ injection INJECT 1 SYRINGE IN THE MUSCLE ONCE 2 Device 0  . hydrochlorothiazide (HYDRODIURIL) 25 MG tablet Take 1 tablet (25 mg total) by mouth daily. 60 tablet 5  . naproxen (NAPROSYN) 250 MG tablet Take 1 tablet (250 mg total) by mouth 2 (two) times daily with a meal. 30 tablet 0  . ondansetron (ZOFRAN) 4 MG tablet Take 4 mg by mouth every 8 (eight) hours as needed for nausea or vomiting. For nausea/vomiting.  0  . oxyCODONE (OXY IR/ROXICODONE) 5 MG immediate release tablet TK 1 T PO Q 8 H  0  . promethazine (PHENERGAN) 25 MG suppository Place 1 suppository (25 mg total) rectally every 6 (six) hours as needed for nausea or vomiting. 12 each 0  . promethazine (PHENERGAN) 25 MG tablet Take 1 tablet (25 mg total) by mouth every 8 (eight) hours as needed for nausea or vomiting. 20 tablet 0  . propranolol ER (INDERAL LA) 80 MG 24 hr capsule TAKE 1 CAPSULE(80 MG) BY MOUTH DAILY 90 capsule 0  . sennosides-docusate sodium (SENOKOT-S) 8.6-50 MG tablet Take 1 tablet by mouth daily as needed for constipation.    . topiramate (TOPAMAX) 100 MG tablet TAKE 1 AND 1/2 TABLETS(150 MG) BY MOUTH TWICE DAILY 90 tablet 3  . verapamil (CALAN-SR) 120 MG CR tablet Take 1 tablet (120 mg total) by mouth at bedtime. 30 tablet 11  . Fremanezumab-vfrm (AJOVY) 225 MG/1.5ML SOSY Inject 1 Syringe into the skin every 30 (thirty) days. 3 Syringe 0  . SUMAtriptan (IMITREX) 100 MG tablet Take 1 tablet (100 mg total) by mouth once. May repeat in 2 hours if headache persists or recurs. 10 tablet 1   No current facility-administered medications for this visit.     Allergies as of 06/27/2017 - Review Complete 06/27/2017  Allergen Reaction Noted  . 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 Aspirin Anaphylaxis 04/26/2007  . Carrot [daucus carota] Anaphylaxis 11/12/2011  . Haloperidol and related Other (See Comments) 08/06/2016    Vitals: BP (!) 172/98 (BP  Location: Right Arm, Patient Position: Sitting) Comment: notified MD, pt states not unusual  Pulse (!) 57   Ht 5\' 5"  (1.651 m)   Wt 210 lb (95.3 kg)   BMI 34.95 kg/m  Last Weight:  Wt Readings from Last 1 Encounters:  06/27/17 210 lb (95.3 kg)   Last Height:   Ht Readings from Last 1 Encounters:  06/27/17 5\' 5"  (1.651 m)   Physical exam: Exam: Gen: NAD, conversant, well nourised, obese, well groomed                     CV: RRR, no MRG. No Carotid Bruits. No peripheral edema, warm, nontender Eyes: Conjunctivae clear without exudates or hemorrhage  Neuro: Detailed Neurologic Exam  Speech:    Speech is normal; fluent and spontaneous with  normal comprehension.  Cognition:    The patient is oriented to person, place, and time;     recent and remote memory intact;     language fluent;     normal attention, concentration,     fund of knowledge Cranial Nerves:    The pupils are equal, round, and reactive to light. The fundi are normal and spontaneous venous pulsations are present. Visual fields are full to finger confrontation. Extraocular movements are intact. Trigeminal sensation is intact and the muscles of mastication are normal. The face is symmetric. The palate elevates in the midline. Hearing intact. Voice is normal. Shoulder shrug is normal. The tongue has normal motion without fasciculations.   Coordination:    Normal finger to nose and heel to shin. Normal rapid alternating movements.   Gait:    Heel-toe and tandem gait are normal.   Motor Observation:    No asymmetry, no atrophy, and no involuntary movements noted. Tone:    Normal muscle tone.    Posture:    Posture is normal. normal erect    Strength:    Strength is V/V in the upper and lower limbs.      Sensation: intact to LT     Reflex Exam:  DTR's:    Deep tendon reflexes in the upper and lower extremities are normal bilaterally.   Toes:    The toes are downgoing bilaterally.   Clonus:    Clonus is  absent.       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   SHE IS ON FIORICET 4x a day every day. This is classic for medication overuse headache. She is given this by pain center, will contact physician to see how accurate her report really is. They only thing to do is to taper down on the Fioricet and pother meds that can cause reboumd. Can't just stop , discussed she needs to go back to the provider to taper her safely and manage pain in other ways.   Migraines: Discussed migraine management including acute management and preventative management. She has failed multiple classes of medications over the years and botox. Been to multiple neurologists. Start Ajovy.  Suspect this patient has poor coping skills with pain as she reports she is in 8/10 pain today and appears comfortable, was even able to examine her pupils with bright light  Not sure I can do anything, will refer to [facility] but told patient she needs to wean completely off of the Fioricet, Opiods and any other medications or foods(caffeine) that can cause medication overuse.  She requests a referral to [facility]  Discussed; To prevent or relieve headaches, try the following:  Cool Compress. Lie down and place a cool compress on your head.   Avoid headache triggers. If certain foods or odors seem to have triggered your migraines in the past, avoid them. A headache diary might help you identify triggers.   Include physical activity in your daily routine. Try a daily walk or other moderate aerobic exercise.   Manage stress. Find healthy ways to cope with the stressors, such as delegating tasks on your to-do list.   Practice relaxation techniques. Try deep breathing, yoga, massage and  visualization.   Eat regularly. Eating regularly scheduled meals and maintaining a healthy diet might help  prevent headaches. Also, drink plenty of fluids.   Follow a regular sleep schedule. Sleep deprivation might contribute to headaches  Consider biofeedback. With this mind-body technique, you learn to control certain bodily functions - such as muscle tension, heart rate and blood pressure - to prevent headaches or reduce headache pain.    Proceed to emergency room if you experience new or worsening symptoms or symptoms do not resolve, if you have new neurologic symptoms or if headache is severe, or for any concerning symptom.     Cc: Dr. Mosetta Putt. Dr. Pearlie Oyster, MD  [facility] [address] [place], [place] [address]  Phone [phone] Fax [phone]

## 2017-06-27 NOTE — Progress Notes (Signed)
Patient given and discussed instruction sheet included in Ajovy box. Discussed Ajovy administrations with patient including the following: keeping doses in refrigerator, letting dose sit at room temp 30 minutes prior to injection, injection sites (abdomen, thigh, and back of upper arm), wash hands, cleanse site circular motion inside to outside, pinch skin & inject entire syringe of medication at 45-90 degree angle, do not recap needle; dispose of used syringe into sharps container. Patient verbalized understanding of all. Injection given in R upper quadrant of abdomen. Patient tolerated well. Two additional syringes given to patient for dose on 07/28/17 & 08/25/17. Patient's visitor Janey Greaserntoine also educated and verbalized understanding.

## 2017-07-06 ENCOUNTER — Other Ambulatory Visit: Payer: Self-pay

## 2017-07-06 ENCOUNTER — Observation Stay (HOSPITAL_BASED_OUTPATIENT_CLINIC_OR_DEPARTMENT_OTHER): Payer: Medicaid Other

## 2017-07-06 ENCOUNTER — Observation Stay (HOSPITAL_COMMUNITY): Payer: Medicaid Other

## 2017-07-06 ENCOUNTER — Inpatient Hospital Stay (HOSPITAL_COMMUNITY)
Admission: EM | Admit: 2017-07-06 | Discharge: 2017-07-08 | DRG: 312 | Disposition: A | Discharge status: Home / Self Care | Payer: Medicaid Other | Attending: Family Medicine | Admitting: Family Medicine

## 2017-07-06 ENCOUNTER — Emergency Department (HOSPITAL_COMMUNITY): Payer: Medicaid Other

## 2017-07-06 ENCOUNTER — Encounter (HOSPITAL_COMMUNITY): Payer: Self-pay | Admitting: General Practice

## 2017-07-06 DIAGNOSIS — G43909 Migraine, unspecified, not intractable, without status migrainosus: Secondary | ICD-10-CM | POA: Diagnosis present

## 2017-07-06 DIAGNOSIS — G43719 Chronic migraine without aura, intractable, without status migrainosus: Secondary | ICD-10-CM

## 2017-07-06 DIAGNOSIS — Z6833 Body mass index (BMI) 33.0-33.9, adult: Secondary | ICD-10-CM

## 2017-07-06 DIAGNOSIS — Z9114 Patient's other noncompliance with medication regimen: Secondary | ICD-10-CM

## 2017-07-06 DIAGNOSIS — E669 Obesity, unspecified: Secondary | ICD-10-CM | POA: Diagnosis present

## 2017-07-06 DIAGNOSIS — N179 Acute kidney failure, unspecified: Secondary | ICD-10-CM | POA: Diagnosis not present

## 2017-07-06 DIAGNOSIS — R001 Bradycardia, unspecified: Secondary | ICD-10-CM | POA: Diagnosis present

## 2017-07-06 DIAGNOSIS — T502X5A Adverse effect of carbonic-anhydrase inhibitors, benzothiadiazides and other diuretics, initial encounter: Secondary | ICD-10-CM | POA: Diagnosis present

## 2017-07-06 DIAGNOSIS — Z79899 Other long term (current) drug therapy: Secondary | ICD-10-CM

## 2017-07-06 DIAGNOSIS — W19XXXA Unspecified fall, initial encounter: Secondary | ICD-10-CM | POA: Diagnosis present

## 2017-07-06 DIAGNOSIS — Z885 Allergy status to narcotic agent status: Secondary | ICD-10-CM

## 2017-07-06 DIAGNOSIS — E876 Hypokalemia: Secondary | ICD-10-CM

## 2017-07-06 DIAGNOSIS — R55 Syncope and collapse: Secondary | ICD-10-CM | POA: Diagnosis not present

## 2017-07-06 DIAGNOSIS — I959 Hypotension, unspecified: Secondary | ICD-10-CM | POA: Diagnosis present

## 2017-07-06 DIAGNOSIS — Z91018 Allergy to other foods: Secondary | ICD-10-CM

## 2017-07-06 DIAGNOSIS — G43711 Chronic migraine without aura, intractable, with status migrainosus: Secondary | ICD-10-CM

## 2017-07-06 DIAGNOSIS — Z791 Long term (current) use of non-steroidal anti-inflammatories (NSAID): Secondary | ICD-10-CM

## 2017-07-06 DIAGNOSIS — I509 Heart failure, unspecified: Secondary | ICD-10-CM | POA: Diagnosis present

## 2017-07-06 DIAGNOSIS — I110 Hypertensive heart disease with heart failure: Secondary | ICD-10-CM | POA: Diagnosis present

## 2017-07-06 DIAGNOSIS — E049 Nontoxic goiter, unspecified: Secondary | ICD-10-CM | POA: Diagnosis present

## 2017-07-06 DIAGNOSIS — Z886 Allergy status to analgesic agent status: Secondary | ICD-10-CM

## 2017-07-06 DIAGNOSIS — S0101XA Laceration without foreign body of scalp, initial encounter: Secondary | ICD-10-CM

## 2017-07-06 DIAGNOSIS — G8929 Other chronic pain: Secondary | ICD-10-CM | POA: Diagnosis present

## 2017-07-06 HISTORY — DX: Syncope and collapse: R55

## 2017-07-06 HISTORY — DX: Family history of other specified conditions: Z84.89

## 2017-07-06 LAB — BASIC METABOLIC PANEL
ANION GAP: 12 (ref 5–15)
Anion gap: 10 (ref 5–15)
BUN: 13 mg/dL (ref 6–20)
BUN: 10 mg/dL (ref 6–20)
CALCIUM: 9.5 mg/dL (ref 8.9–10.3)
CHLORIDE: 109 mmol/L (ref 101–111)
CO2: 22 mmol/L (ref 22–32)
CO2: 19 mmol/L — AB (ref 22–32)
Calcium: 9.2 mg/dL (ref 8.9–10.3)
Chloride: 106 mmol/L (ref 101–111)
Creatinine, Ser: 1.4 mg/dL — ABNORMAL HIGH (ref 0.44–1.00)
Creatinine, Ser: 1.18 mg/dL — ABNORMAL HIGH (ref 0.44–1.00)
GFR calc Af Amer: 48 mL/min — ABNORMAL LOW (ref >60)
GFR calc Af Amer: 59 mL/min — ABNORMAL LOW (ref >60)
GFR calc non Af Amer: 51 mL/min — ABNORMAL LOW (ref >60)
GFR, EST NON AFRICAN AMERICAN: 42 mL/min — AB (ref >60)
GLUCOSE: 137 mg/dL — AB (ref 65–99)
GLUCOSE: 145 mg/dL — AB (ref 65–99)
POTASSIUM: 3.7 mmol/L (ref 3.5–5.1)
Potassium: 3 mmol/L — ABNORMAL LOW (ref 3.5–5.1)
Sodium: 140 mmol/L (ref 135–145)
Sodium: 138 mmol/L (ref 135–145)

## 2017-07-06 LAB — CBC WITH DIFFERENTIAL/PLATELET
BASOS ABS: 0 10*3/uL (ref 0.0–0.1)
BASOS PCT: 1 %
EOS PCT: 3 %
Eosinophils Absolute: 0.2 10*3/uL (ref 0.0–0.7)
HCT: 42.3 % (ref 36.0–46.0)
Hemoglobin: 14.3 g/dL (ref 12.0–15.0)
Lymphocytes Relative: 24 %
Lymphs Abs: 1.5 10*3/uL (ref 0.7–4.0)
MCH: 31 pg (ref 26.0–34.0)
MCHC: 33.8 g/dL (ref 30.0–36.0)
MCV: 91.6 fL (ref 78.0–100.0)
MONO ABS: 0.5 10*3/uL (ref 0.1–1.0)
Monocytes Relative: 8 %
Neutro Abs: 4 10*3/uL (ref 1.7–7.7)
Neutrophils Relative %: 64 %
PLATELETS: 222 10*3/uL (ref 150–400)
RBC: 4.62 MIL/uL (ref 3.87–5.11)
RDW: 14 % (ref 11.5–15.5)
WBC: 6.2 10*3/uL (ref 4.0–10.5)

## 2017-07-06 LAB — T4, FREE: Free T4: 0.84 ng/dL (ref 0.61–1.12)

## 2017-07-06 LAB — HEMOGLOBIN A1C
Hgb A1c MFr Bld: 5.8 % — ABNORMAL HIGH (ref 4.8–5.6)
Mean Plasma Glucose: 119.76 mg/dL

## 2017-07-06 LAB — MAGNESIUM: MAGNESIUM: 2 mg/dL (ref 1.7–2.4)

## 2017-07-06 LAB — ECHOCARDIOGRAM COMPLETE
Height: 65 in
Weight: 3248 oz

## 2017-07-06 LAB — TROPONIN I: Troponin I: <0.03 ng/mL (ref <0.03)

## 2017-07-06 LAB — TSH: TSH: 1.244 u[IU]/mL (ref 0.350–4.500)

## 2017-07-06 LAB — PHOSPHORUS: PHOSPHORUS: 1.8 mg/dL — AB (ref 2.5–4.6)

## 2017-07-06 MED ORDER — DIPHENHYDRAMINE HCL 25 MG PO TABS: 25.0000 mg | ORAL_TABLET | Freq: Four times a day (QID) | ORAL | Status: DC | PRN

## 2017-07-06 MED ORDER — ACETAMINOPHEN 325 MG PO TABS: 650.0000 mg | ORAL_TABLET | Freq: Four times a day (QID) | ORAL | Status: DC | PRN

## 2017-07-06 MED ORDER — CLONIDINE HCL 0.1 MG PO TABS
0.0500 mg | ORAL_TABLET | Freq: Two times a day (BID) | ORAL | Status: AC
  Administered 2017-07-06 – 2017-07-07 (×2): 0.05 mg via ORAL
  Filled 2017-07-06 (×3): qty 1

## 2017-07-06 MED ORDER — MORPHINE SULFATE (PF) 4 MG/ML IV SOLN
4.0000 mg | Freq: Once | INTRAVENOUS | Status: AC
  Administered 2017-07-06: 4 mg via INTRAVENOUS
  Filled 2017-07-06: qty 1

## 2017-07-06 MED ORDER — DEXAMETHASONE SODIUM PHOSPHATE 10 MG/ML IJ SOLN
10.0000 mg | Freq: Once | INTRAMUSCULAR | Status: AC
  Administered 2017-07-06: 10 mg via INTRAVENOUS
  Filled 2017-07-06: qty 1

## 2017-07-06 MED ORDER — CLONIDINE HCL 0.2 MG PO TABS
0.1000 mg | ORAL_TABLET | Freq: Two times a day (BID) | ORAL | Status: DC
  Administered 2017-07-06: 0.1 mg via ORAL
  Filled 2017-07-06: qty 1

## 2017-07-06 MED ORDER — TOPIRAMATE 25 MG PO TABS
150.0000 mg | ORAL_TABLET | Freq: Every day | ORAL | Status: DC
  Administered 2017-07-06 – 2017-07-08 (×3): 150 mg via ORAL
  Filled 2017-07-06: qty 2
  Filled 2017-07-06: qty 6
  Filled 2017-07-06: qty 2

## 2017-07-06 MED ORDER — ENOXAPARIN SODIUM 40 MG/0.4ML ~~LOC~~ SOLN: 40.0000 mg | SUBCUTANEOUS | Status: DC

## 2017-07-06 MED ORDER — ENOXAPARIN SODIUM 60 MG/0.6ML ~~LOC~~ SOLN
0.5000 mg/kg | SUBCUTANEOUS | Status: DC
  Administered 2017-07-06 – 2017-07-07 (×2): 45 mg via SUBCUTANEOUS
  Filled 2017-07-06 (×2): qty 0.6

## 2017-07-06 MED ORDER — VERAPAMIL HCL ER 120 MG PO TBCR: 120.0000 mg | EXTENDED_RELEASE_TABLET | Freq: Every day | ORAL | Status: DC

## 2017-07-06 MED ORDER — METOCLOPRAMIDE HCL 5 MG/ML IJ SOLN
10.0000 mg | Freq: Once | INTRAMUSCULAR | Status: AC
  Administered 2017-07-06: 10 mg via INTRAVENOUS
  Filled 2017-07-06: qty 2

## 2017-07-06 MED ORDER — ACETAMINOPHEN 650 MG RE SUPP: 650.0000 mg | Freq: Four times a day (QID) | RECTAL | Status: DC | PRN

## 2017-07-06 MED ORDER — POTASSIUM CHLORIDE CRYS ER 20 MEQ PO TBCR
40.0000 meq | EXTENDED_RELEASE_TABLET | Freq: Once | ORAL | Status: AC
  Administered 2017-07-06: 40 meq via ORAL
  Filled 2017-07-06: qty 2

## 2017-07-06 MED ORDER — CLONIDINE HCL 0.1 MG PO TABS: 0.0500 mg | ORAL_TABLET | Freq: Every day | ORAL | Status: DC

## 2017-07-06 MED ORDER — PROMETHAZINE HCL 25 MG PO TABS
25.0000 mg | ORAL_TABLET | Freq: Three times a day (TID) | ORAL | Status: DC | PRN
  Administered 2017-07-07 – 2017-07-08 (×2): 25 mg via ORAL
  Filled 2017-07-06 (×3): qty 1

## 2017-07-06 MED ORDER — ENOXAPARIN SODIUM 60 MG/0.6ML ~~LOC~~ SOLN: 0.5000 mg/kg | SUBCUTANEOUS | Status: DC

## 2017-07-06 MED ORDER — SUMATRIPTAN SUCCINATE 100 MG PO TABS
100.0000 mg | ORAL_TABLET | Freq: Once | ORAL | Status: AC
  Administered 2017-07-06: 100 mg via ORAL
  Filled 2017-07-06: qty 1

## 2017-07-06 MED ORDER — PROPRANOLOL HCL ER 80 MG PO CP24
80.0000 mg | ORAL_CAPSULE | Freq: Every day | ORAL | Status: DC
  Filled 2017-07-06 (×2): qty 1

## 2017-07-06 MED ORDER — OXYCODONE HCL 5 MG PO TABS
5.0000 mg | ORAL_TABLET | Freq: Three times a day (TID) | ORAL | Status: DC | PRN
  Administered 2017-07-06 – 2017-07-07 (×4): 5 mg via ORAL
  Filled 2017-07-06 (×4): qty 1

## 2017-07-06 MED ORDER — CHLORPROMAZINE HCL 10 MG PO TABS
10.0000 mg | ORAL_TABLET | Freq: Three times a day (TID) | ORAL | Status: DC
  Administered 2017-07-06 – 2017-07-08 (×6): 10 mg via ORAL
  Filled 2017-07-06 (×10): qty 1

## 2017-07-06 MED ORDER — ONDANSETRON HCL 4 MG/2ML IJ SOLN
4.0000 mg | Freq: Three times a day (TID) | INTRAMUSCULAR | Status: DC | PRN
  Administered 2017-07-06: 4 mg via INTRAVENOUS
  Filled 2017-07-06: qty 2

## 2017-07-06 MED ORDER — SODIUM CHLORIDE 0.9% FLUSH
3.0000 mL | Freq: Two times a day (BID) | INTRAVENOUS | Status: DC
  Administered 2017-07-06 – 2017-07-07 (×2): 3 mL via INTRAVENOUS

## 2017-07-06 MED ORDER — DIPHENHYDRAMINE HCL 50 MG/ML IJ SOLN
25.0000 mg | Freq: Once | INTRAMUSCULAR | Status: AC
  Administered 2017-07-06: 25 mg via INTRAVENOUS
  Filled 2017-07-06: qty 1

## 2017-07-06 MED ORDER — SODIUM CHLORIDE 0.9 % IV BOLUS (SEPSIS)
1000.0000 mL | Freq: Once | INTRAVENOUS | Status: AC
  Administered 2017-07-06: 1000 mL via INTRAVENOUS

## 2017-07-06 MED ORDER — CLONIDINE HCL 0.1 MG PO TABS: 0.0500 mg | ORAL_TABLET | Freq: Two times a day (BID) | ORAL | Status: DC

## 2017-07-06 NOTE — ED Triage Notes (Signed)
Pt to ED via [facility] after feeling nauseous, sitting on toilet, and then having syncopal episode and hitting head. Pt has small lac on forehead from this episode. Pt denies neck or back pain but endorses headache. A/O x4 on EMS arrival and at this time. NAD.

## 2017-07-06 NOTE — ED Notes (Signed)
Regular Diet was ordered for Lunch. 

## 2017-07-06 NOTE — ED Notes (Signed)
Echo at bedside

## 2017-07-06 NOTE — Procedures (Signed)
ELECTROENCEPHALOGRAM REPORT  Date of Study: 07/06/2017  Patient's Name: Kirsten Walsh MRN: [id number] Date of Birth: [birth year]  Referring Provider: Dr. Myrene BuddyJacob Fletcher  Clinical History: This is a 57 year old woman with syncope.  Medications: Topamax Tylenol Thorazine Clonidine Prpranolol  Technical Summary: A multichannel digital EEG recording measured by the international 10-20 system with electrodes applied with paste and impedances below 5000 ohms performed in our laboratory with EKG monitoring in an awake and asleep patient.  Hyperventilation and photic stimulation were not performed.  The digital EEG was referentially recorded, reformatted, and digitally filtered in a variety of bipolar and referential montages for optimal display.    Description: The patient is awake and asleep during the recording.  During maximal wakefulness, there is a symmetric, medium voltage 9 Hz posterior dominant rhythm that attenuates with eye opening.  The record is symmetric.  During drowsiness and sleep, there is an increase in theta slowing of the background.  Vertex waves and symmetric sleep spindles were seen.  Hyperventilation and photic stimulation were not performed.  There were no epileptiform discharges or electrographic seizures seen.    EKG lead was unremarkable.  Impression: This awake and asleep EEG is normal.    Clinical Correlation: A normal EEG does not exclude a clinical diagnosis of epilepsy. Clinical correlation is advised.   Patrcia DollyKaren Aquino, M.D.

## 2017-07-06 NOTE — ED Notes (Signed)
Lunch tray given. 

## 2017-07-06 NOTE — H&P (Signed)
Family Medicine Teaching [facility] History and Physical Service Pager: [phone]  Patient name: Kirsten Walsh Medical record number: [id number] Date of birth: 1960-06-27 Age: 57 y.o. Gender: female  Primary Care Provider: Howard PouchFeng, Lauren, MD Consultants: none Code Status: full  Chief Complaint: passed out on toilet  Assessment and Plan: Kirsten Walsh is a 57 y.o. female with PMH of chronic migraines, chronic pain, HTN, syncope, obesity, CHF presenting with a fall after a syncopal event that occurred when she was "on the toilet".   Syncope Syncopal event that occurred while she was on the toilet. Has happened before, also while she was on the toilet. Likely vasovagal syncope and has been seen for this in the past. Will attempt to rule out any cardiac causes for her syncope. Very unlikely that she has neurological cause given that her migraines appear well controlled and she had no seizure-like activity. Will plan to get EEG to rule out seizure, especially in setting of known migraines and right sided weakness. Only significant electrolyte abnormality is k of 3.0. Has slight bump in creatinine to 1.40 so perhaps dehydration playing some role. Cbc without abnormality. CT head negative. CT C-spine only significant for ovoid density in Left Lobe of thyroid, no signs of stroke or hematoma. EKG showing NSR. Vitals are normal. Neurological exam showing 5/5 strength throughout but right upper and lower extremity slightly more weak than compared to left (apparently this is chronic for her), no other focal deficits. - admit for observation, Dr. Leveda AnnaHensel, appropriate for telemetry - vital signs per floor routine - repeat ekg prior to discharge - echocardiogram  - EEG - Orthostatics - trend troponins - draw mg, phos, TSH, A1C  Head Laceration Secondary to fall during syncopal episode. Repaired by ED physician by 5 staples. CT head negative for bleed. Likely will need staples out in  2 weeks. Has staples.  -Tylenol and oxycodone 5mg  IR (outpatient dose) for pain control.   Hypokalemia  K of 3.0 in ED. Likely secondary to her hydrochlorothiazide and emesis x1. Receiving 40meq kdur in the ed. Recheck bmp prior to discharge. Would stop this on DC and consider switching to another antihypertensive.  -Will hold HCTZ while in patient due to slight aki. - replete with kdur as needed - recheck bmp prior to discharge  AKI Creatinine of 1.40, no hx of CKD. Patient with emesis prior to presentation, thinks she ate something that didn't agree with her (tuna salad). Limited PO intake, in setting of vomiting, and on loop diuretic has likely lead to pre-renal etiology for her acute kidney injury. Will hold HCTZ while inpatient. No nausea at present so can self-hydrate. Receiving 1L NS in ed. Recheck cr prior to discharge, will likely self-resolve. - hold nephrotoxic medications - fluids as needed  Migraines Lengthy history of chronic migraines. 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 Takes imitrex, topamax, benadryl, thorazine. Also takes Ajovy per neurology, started on this on 06/27/17. Feels this has been better controlled recently. Patient on a very large dose of topamax with critical warning received. Will check with her pharmacy to ensure 150mg  is her correct dose. Also takes propranolol and verapamil which are adjunct agents. - continue imitrex, thorazine, benadryl - continue propranolol. Apparently not taking verapamil, will hold this - continue home topamax  - Continue home Phenergan for nausea - Will hold Ajovy while inpatient  Hypertension BP well controlled in ed. 111/70 last charted BP. On HCTZ 25mg ,  propranolol, verapamil, clonidine. Will plan to hold HCTZ 25mg  while admitted 2/2 aki. Will resume rest of medications. - hold hctz while inpatient - continue propranolol and clonidine (all of these seem to be used  more for headache rather than hypertension)   CHF Patient with echo in 2016 showing 55-60% EF with mild mitral regurg. Had a previous echo in 2013 showing EF of only 45-50%.  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  -Will draw tsh and t4.  -Likely will need ultrasound w/ or w/o biopsy as outpatient.  Chronic Pain: Sees pain management. Seems to mostly have pain on her right side (unclear etiology) as well as headaches. Apparently taking Oxy RI 5 mg q8 PRN and Fioricet 50-325-40mg  q6 pRN. Unable to see these prescriptions on the controlled substance database for some reason. Pain seems out of proportion on exam, for example when checking pulses in feet she grimaced and pulled away stating it was painful. - Continue home Oxy IR 5 mg q8 - Holding fioricet   FEN/GI: regular diet, getting 1 L bolus, then SLIV  Prophylaxis: lovenox  Disposition: home  History of Present Illness:  Kirsten Walsh is a 57 y.o. female presenting with syncope. She states that she had developed some nausea earlier in the night after eating a tuna melt that didn't agree with her. She had one episode of emesis. At some point after this event she went to use the restroom. The last thing she remembers is sitting on the toilet looking at her phone. She woke up in a "pool of blood" and at that point she yelled for her boyfriend to come help her. The last thing she remembers is that she felt hot. She can't remember if she was on the toilet trying to urinate or defecate. She denies that she voided her bowels or urinated on herself while she was unconscious. She was not confused or disoriented after she awoke.   Patient does have chronic headaches. She sees neurology for this and does take daily medications for the management of this. She states that she has had no migraine flairs recently. Denies any history of seizure. Patient notes that she  has had an episode like this before when she was on the toilet. She notes it has happened twice in her life, the most recent was August 2018. She states she had previously chipped her tooth and a tooth went through her lip.   Patient feels like the right side of her body is weaker than the left, this is chronic for her for the last couple years.  Workup in ED consisted of cbc, cmp, ct head, ct cervical spine, 12 lead ekg. CMP significant for cr of 1.4, k of 3.0. CT head negative, CT c-spine only significant for incident ovoid mass in thyroid. EKG with possible inverted T-waves but otherwise normal. Received decadron, benadryl, reglan, morphine, kdur, and 1L NS in ed.  Review Of Systems: Per HPI with the following additions: see HPI  Review of Systems  Constitutional: Negative for chills, fever and weight loss.  HENT: Negative for congestion.   Eyes: Negative for blurred vision and double vision.  Respiratory: Negative for cough, sputum production and shortness of breath.   Cardiovascular: Negative for chest pain and palpitations.  Gastrointestinal: Positive for nausea and vomiting. Negative for abdominal pain, constipation and diarrhea.  Musculoskeletal: Positive for myalgias (RLE). Negative for back pain, joint  pain and neck pain.  Neurological: Positive for headaches. Negative for dizziness, focal weakness, seizures, loss of consciousness and weakness.    Patient Active Problem List   Diagnosis Date Noted  . Healthcare maintenance 08/09/2016  . Chronic daily headache 09/02/2015  . 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 Syncopal episodes 09/02/2015  . Right arm weakness 08/05/2015  . Edema of right arm and right leg 09/07/2014  . IUD contraception 07/18/2014  . History of anaphylaxis 06/26/2014  . Intractable migraine with status migrainosus 06/10/2014  . Intractable migraine without aura and with status migrainosus   . Bradycardia 02/18/2013  . Cardiomyopathy 10/20/2011  . Decreased  ejection fraction 10/05/2011  . Leg swelling 09/23/2011  . Complex regional pain syndrome 05/03/2011  . Chronic pain 03/29/2011  . NECK PAIN, RIGHT 02/03/2010  . ALLERGIC RHINITIS 01/30/2009  . DIZZINESS 07/16/2008  . Chronic migraine 04/26/2007  . OBESITY, NOS 08/18/2006  . HYPERTENSION, BENIGN SYSTEMIC 08/18/2006    Past Medical History: Past Medical History:  Diagnosis Date  . Chest pain   . Chronic lower back pain    "right side to mid back" (02/14/2013)  . Gall stones   . Hypertension   . Kidney stone ~ 2006  . Migraine    "qd" (02/14/2013)    Past Surgical History: Past Surgical History:  Procedure Laterality Date  . CYSTOSCOPY W/ STONE MANIPULATION  ~ 2006  . INTRAUTERINE DEVICE INSERTION     "initially put in in 04/2002; changed prn" (02/14/2013)  . LAPAROSCOPIC CHOLECYSTECTOMY  ~ 2004    Social History: Social History   Tobacco Use  . Smoking status: Never Smoker  . Smokeless tobacco: Never Used  Substance Use Topics  . Alcohol use: Yes    Comment: occ  . Drug use: No   Please also refer to relevant sections of EMR.  Family History: Family History  Problem Relation Age of Onset  . Cancer Mother   . Pancreatic cancer Mother   . Cirrhosis Father   . Lupus Sister   . Diabetes Maternal Grandmother     Allergies and Medications: Allergies  Allergen Reactions  . Apple Anaphylaxis  . Aspirin Anaphylaxis  . Carrot [Daucus Carota] Anaphylaxis  . Haloperidol And Related Other (See Comments)    Lock jaw and slurred speech   No current facility-administered medications on file prior to encounter.    Current Outpatient Medications on File Prior to Encounter  Medication Sig Dispense Refill  . chlorproMAZINE (THORAZINE) 10 MG tablet Take 1 tablet (10 mg total) by mouth 3 (three) times daily. 10 tablet 0  . cloNIDine (CATAPRES) 0.1 MG tablet Take 1 tablet (0.1 mg total) by mouth 2 (two) times daily. 60 tablet 11  . EPIPEN 2-PAK 0.3 MG/0.3ML SOAJ injection  INJECT 1 SYRINGE IN THE MUSCLE ONCE (Patient taking differently: INJECT 1 SYRINGE IN THE MUSCLE AS NEEDED FOR ALLERGIC REACTION) 2 Device 0  . Fremanezumab-vfrm (AJOVY) 225 MG/1.5ML SOSY Inject 1 Syringe into the skin every 30 (thirty) days. 3 Syringe 0  . hydrochlorothiazide (HYDRODIURIL) 25 MG tablet Take 1 tablet (25 mg total) by mouth daily. 60 tablet 5  . promethazine (PHENERGAN) 25 MG tablet Take 1 tablet (25 mg total) by mouth every 8 (eight) hours as needed for nausea or vomiting. 20 tablet 0  . propranolol ER (INDERAL LA) 80 MG 24 hr capsule TAKE 1 CAPSULE(80 MG) BY MOUTH DAILY 90 capsule 0  . SUMAtriptan (IMITREX) 100 MG tablet Take 1 tablet (100 mg total) by mouth  once. May repeat in 2 hours if headache persists or recurs. 10 tablet 1  . topiramate (TOPAMAX) 100 MG tablet TAKE 1 AND 1/2 TABLETS(150 MG) BY MOUTH TWICE DAILY 90 tablet 3  . diphenhydrAMINE (BENADRYL) 25 MG tablet Take 1 tablet (25 mg total) by mouth every 6 (six) hours as needed. (Patient not taking: Reported on 07/06/2017) 30 tablet 0  . naproxen (NAPROSYN) 250 MG tablet Take 1 tablet (250 mg total) by mouth 2 (two) times daily with a meal. (Patient not taking: Reported on 07/06/2017) 30 tablet 0  . promethazine (PHENERGAN) 25 MG suppository Place 1 suppository (25 mg total) rectally every 6 (six) hours as needed for nausea or vomiting. (Patient not taking: Reported on 07/06/2017) 12 each 0  . verapamil (CALAN-SR) 120 MG CR tablet Take 1 tablet (120 mg total) by mouth at bedtime. (Patient not taking: Reported on 07/06/2017) 30 tablet 11    Objective: BP 115/64   Pulse 77   Temp 98.2 F (36.8 C) (Oral)   Resp 15   Ht 5\' 5"  (1.651 m)   Wt 203 lb (92.1 kg)   SpO2 99%   BMI 33.78 kg/m  Exam: General: obese, African American female. AOx3. Appropriate mentation, although appears to embellish pain. Eyes: eomi, perrl,. No conjunctival injection ENTM: bilateral external ears and nose without trauma. Large laceration to  forehead, 5 staples in placed Neck: no cervical adenopathy, good range of motion Cardiovascular: rrr, no m/r/g Respiratory: lungs ctab, no chest pain Gastrointestinal: soft, non-tender, non-distended MSK: 5/5 strength all muscles groups BLE, BUE. Left side stronger than right although I feel right side is limited by patient participation Derm: warm and dry. No rash noted ~3 inch laceration on right forehead closed with 5 staples Neuro: Alert and oriented, CN 2-12 intact, sensation intact throughout Psych: appropriate, quite and soft spoken  Labs and Imaging: CBC BMET  Recent Labs  Lab 07/06/17 0103  WBC 6.2  HGB 14.3  HCT 42.3  PLT 222   Recent Labs  Lab 07/06/17 0103  NA 140  K 3.0*  CL 106  CO2 22  BUN 13  CREATININE 1.40*  GLUCOSE 137*  CALCIUM 9.5     Myrene Buddy, MD 07/06/2017, 4:20 AM PGY-1, [place] [facility] FPTS Intern pager: [phone], text pages welcome  I have separately seen and examined the patient. I have discussed the findings and exam with Dr. Primitivo Gauze and agree with the above note.  My changes/additions are outlined in BLUE.   Anders Simmonds, MD [facility] Family Medicine, PGY-3

## 2017-07-06 NOTE — ED Notes (Signed)
EEG at bedside.

## 2017-07-06 NOTE — Progress Notes (Signed)
  Echocardiogram 2D Echocardiogram has been performed.  Kirsten Walsh, Kirsten Walsh F 07/06/2017, 3:31 PM

## 2017-07-06 NOTE — Progress Notes (Signed)
Family Medicine Teaching Service Daily Progress Note Intern Pager: [phone]  Patient name: Kirsten Walsh Medical record number: [id number] Date of birth: 1961-06-07 Age: 57 y.o. Gender: female  Primary Care Provider: Howard Pouch, MD Consultants: None Code Status: Full  Pt Overview and Major Events to Date:  1/16 - admitted for syncope  Assessment and Plan: Kirsten Walsh is a 57 y.o. female with PMH of chronic migraines, chronic pain, HTN, syncope, obesity, CHF presenting with a fall after a syncopal event that occurred when she was "on the toilet."   Syncope Syncopal event that occurred while she was on the toilet. Has happened before, also while she was on the toilet. Also reports to have passed out about 9 times within the past few months when standing or sitting on the toilet. Most likely etiology vasovagal syncope vs. Orthostatic hypotension. Orthostatics negative although obtained after fluid resusitation and withholding diuretic. Obtained ECHO and EEG to rule out any cardiac or neurologic causes for her syncope, both wnl. ECHO with EF 60-65% with no overt valvular or wall motion abnormalities. CT head negative. CT C-spine only significant for ovoid density in Left Lobe of thyroid, no signs of stroke or hematoma. EKG showing NSR. Patient has previously had 30 day event monitor with Cards in the past without event. Noted if occurred persistently, may need implantable recorder. Vitals continue to be normal. Neurological exam showing 5/5 strength throughout but right upper and lower extremity slightly more weak than compared to left (apparently this is chronic for her), no other focal deficits. - repeat ekg prior to discharge - Cardiology consult  Head Laceration Secondary to fall during syncopal episode. Repaired by ED physician by 5 staples. CT head negative for bleed. Likely will need staples out in 2 weeks. Has staples.  -Oxycodone 5mg  IR (outpatient dose) for pain  control.   Hypokalemia, Improving  K of 3.0 > 3.7. Likely secondary to her hydrochlorothiazide and emesis x1. Will stop HCTZ on DC and consider switching to another antihypertensive.  - Will d/c HCTZ - replete with kdur as needed - recheck bmp prior to discharge  AKI, Improving Creatinine of 1.40>1.18, no hx of CKD. Patient with emesis prior to presentation, thinks she ate something that didn't agree with her (tuna salad). Limited PO intake, in setting of vomiting, and on loop diuretic has likely led to pre-renal etiology for her acute kidney injury. HCTZ d/ced. No nausea at present so can self-hydrate. S/p 1L NS in ed.  - hold nephrotoxic medications - fluids as needed  Migraines Lengthy history of chronic migraines. 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 Takes imitrex, topamax, benadryl, thorazine, propanolol. Reports use of Oxycodone and Fioricet although not on home med list, [place] database consistent with Oxy IR 5mg  q8h. Also takes Ajovy per neurology, started on this on 06/27/17. Also verapamil is listed, but patient reports not taking. Follows at [facility] - continue imitrex, thorazine, benadryl - continue propranolol. Apparently not taking verapamil, will hold this. - continue home topamax  - Continue home Phenergan for nausea - continue home fioricet 1mg  q6h prn   Hypertension Low normotensive at rest. 104/64 last charted BP. On HCTZ 25mg , propranolol, clonidine. Not taking verapamil, not resumed. Will resume rest of medications except for HCTZ due to AKI. - continue propranolol  - continue clonidine at decreased dose   CHF Patient with echo in 2016 showing 55-60% EF with mild mitral regurg. Had a previous echo  in 2013 showing EF of only 45-50%.  - Will plan to repeat echo as part of syncope workup.  - Already with beta-blocker, diuretic, CCB on board so unclear how much management will need to be done if  abnormal result.  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 TSH and T4 wnl. - Likely will need ultrasound w/ or w/o biopsy as outpatient.  Chronic Pain: Sees pain management. Seems to mostly have pain on her right side (unclear etiology) as well as chronic headaches. Apparently taking Oxy IR 5 mg q8 PRN and Fioricet 50-325-40mg  q6 pRN. Unable to see these prescriptions on the controlled substance database for some reason. Pain seems out of proportion on exam, for example when checking pulses in feet she grimaced and pulled away stating it was painful. - Continue home Oxy IR 5 mg q8 - Holding fioricet   FEN/GI: regular diet  Prophylaxis: lovenox  Disposition: continue inpatient management of syncope workup  Subjective:   Patient with some R sided headache pain on exam this morning. Patient became very defensive when asked about her home medication regimen for migraines.  Objective: Temp:  [98.2 F (36.8 C)] 98.2 F (36.8 C) (01/16 0044) Pulse Rate:  [59-79] 65 (01/16 0630) Resp:  [11-22] 16 (01/16 0630) BP: (101-138)/(63-76) 101/65 (01/16 0630) SpO2:  [99 %-100 %] 100 % (01/16 0630) Weight:  [203 lb (92.1 kg)] 203 lb (92.1 kg) (01/16 0049) Physical Exam: General: obese female sitting in bed, in acute distress, agitated. Cardiovascular: RRR, no murmurs/rubs/gallop Respiratory: CTAB, no wheezes/rales/rhonchi Abdomen: NTND, soft Extremities: warm and well perfused, +DP pulses  Laboratory: Recent Labs  Lab 07/06/17 0103  WBC 6.2  HGB 14.3  HCT 42.3  PLT 222   Recent Labs  Lab 07/06/17 0103  NA 140  K 3.0*  CL 106  CO2 22  BUN 13  CREATININE 1.40*  CALCIUM 9.5  GLUCOSE 137*    Imaging/Diagnostic Tests:  1/16 ECHO Study Conclusions - Left ventricle: The cavity size was normal. Systolic function was normal. The estimated ejection fraction was in the range of 60% to 65%. Wall motion was normal; there were no regional wall motion  abnormalities. Left ventricular diastolic function parameters were normal. - Aortic valve: There was no regurgitation. - Mitral valve: There was trivial regurgitation. - Left atrium: The atrium was normal in size. - Right ventricle: Systolic function was normal. - Right atrium: The atrium was normal in size. - Tricuspid valve: There was trivial regurgitation. - Pulmonary arteries: Systolic pressure was within the normal range. - Inferior vena cava: The vessel was normal in size. - Pericardium, extracardiac: There was no pericardial effusion  1/16 CT C-spine, Head FINDINGS: CT HEAD FINDINGS Brain: The ventricles and sulci appropriate size for patient's age. Incidental note of cavum septum pellucidum and cavum vergae. The gray-white matter discrimination is preserved. There is no acute intracranial hemorrhage. No mass effect or midline shift. No extra-axial fluid collection. Vascular: No hyperdense vessel or unexpected calcification. Skull: Normal. Negative for fracture or focal lesion. Sinuses/Orbits: No acute finding. Other: None CT CERVICAL SPINE FINDINGS Alignment: No acute subluxation. There is straightening of normal cervical lordosis which may be positional or due to muscle spasm. Skull base and vertebrae: No acute fracture. No primary bone lesion or focal pathologic process. Soft tissues and spinal canal: No prevertebral fluid or swelling. No visible canal hematoma. Disc levels:  Degenerative changes at C6-C7. Upper chest: Negative. Other: There is a 1.4 x 1.7 cm  ovoid soft tissue density inferior and posterior to the left lobe of the thyroid gland which may represent extension of the thyroid tissue or an exophytic thyroid versus a parathyroid nodule (series 6, image 78). Further evaluation with thyroid ultrasound recommended. IMPRESSION: 1. No acute intracranial pathology. 2. No acute/traumatic cervical spine pathology. 3. Ovoid soft tissue density inferior to the left lobe of the  thyroid gland. Further evaluation with ultrasound recommended.  Ellwood DenseRumball, [place] Oborn, DO 07/06/2017, 7:27 AM PGY-1, [place] [facility] FPTS Intern pager: [phone], text pages welcome

## 2017-07-06 NOTE — ED Notes (Signed)
ED Provider at bedside. 

## 2017-07-06 NOTE — ED Notes (Signed)
Dr. Hensel at bedside 

## 2017-07-06 NOTE — ED Notes (Signed)
Patient c/o increased pain with EEG electro's being placed MD aware medication orders received and given.

## 2017-07-06 NOTE — Progress Notes (Signed)
Pt incredibly sensitive to smallest amount of touch due to staples at frontal region. RN giving pain meds to help.

## 2017-07-06 NOTE — ED Provider Notes (Signed)
[facility] EMERGENCY DEPARTMENT Provider Note   CSN: [id number] Arrival date & time: 07/06/17  0046     History   Chief Complaint Chief Complaint  Patient presents with  . Loss of Consciousness    HPI Kirsten Walsh is a 57 y.o. female.  The history is provided by the patient.  She has history migraines, hypertension, cardiomyopathy, complex regional pain syndrome and comes in after having had a syncopal episode.  She states that she has a bifrontal headache which is throbbing and typical of her migraines.  She rates pain at 8/10.  She was sitting on the commode when she developed nausea and passed out.  She apparently hit her head on her shower and suffered a laceration.  She states she woke up in a pool of blood.  She has vomited.  She denies ongoing dizziness.  She denies any weakness, numbness, tingling.  She is not sure when her last tetanus immunization was.  Curiously, she is not sure if she was on the commode to urinate or defecate.  Past Medical History:  Diagnosis Date  . Chest pain   . Chronic lower back pain    "right side to mid back" (02/14/2013)  . Gall stones   . Hypertension   . Kidney stone ~ 2006  . Migraine    "qd" (02/14/2013)    Patient Active Problem List   Diagnosis Date Noted  . Healthcare maintenance 08/09/2016  . Chronic daily headache 09/02/2015  . 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 Syncopal episodes 09/02/2015  . Right arm weakness 08/05/2015  . Edema of right arm and right leg 09/07/2014  . IUD contraception 07/18/2014  . History of anaphylaxis 06/26/2014  . Intractable migraine with status migrainosus 06/10/2014  . Intractable migraine without aura and with status migrainosus   . Bradycardia 02/18/2013  . Cardiomyopathy 10/20/2011  . Decreased ejection fraction 10/05/2011  . Leg swelling 09/23/2011  . Complex regional pain syndrome 05/03/2011  . Chronic pain 03/29/2011  . NECK PAIN, RIGHT 02/03/2010  . ALLERGIC  RHINITIS 01/30/2009  . DIZZINESS 07/16/2008  . Chronic migraine 04/26/2007  . OBESITY, NOS 08/18/2006  . HYPERTENSION, BENIGN SYSTEMIC 08/18/2006    Past Surgical History:  Procedure Laterality Date  . CYSTOSCOPY W/ STONE MANIPULATION  ~ 2006  . INTRAUTERINE DEVICE INSERTION     "initially put in in 04/2002; changed prn" (02/14/2013)  . LAPAROSCOPIC CHOLECYSTECTOMY  ~ 2004    OB History    No data available       Home Medications    Prior to Admission medications   Medication Sig Start Date End Date Taking? Authorizing Provider  butalbital-acetaminophen-caffeine (FIORICET, ESGIC) 50-325-40 MG per tablet Take 1 tablet by mouth every 6 (six) hours as needed. For migraine 11/25/14   [provider]  chlorproMAZINE (THORAZINE) 10 MG tablet Take 1 tablet (10 mg total) by mouth 3 (three) times daily. 01/31/17   Almon Hercules, MD  cloNIDine (CATAPRES) 0.1 MG tablet Take 1 tablet (0.1 mg total) by mouth 2 (two) times daily. 03/23/17   Howard Pouch, MD  diphenhydrAMINE (BENADRYL) 25 MG tablet Take 1 tablet (25 mg total) by mouth every 6 (six) hours as needed. 01/31/17   Almon Hercules, MD  EPIPEN 2-PAK 0.3 MG/0.3ML SOAJ injection INJECT 1 SYRINGE IN THE MUSCLE ONCE 02/03/15   Ardith Dark, MD  Fremanezumab-vfrm (AJOVY) 225 MG/1.5ML SOSY Inject 1 Syringe into the skin every 30 (thirty) days. 06/27/17   Naomie Dean  B, MD  hydrochlorothiazide (HYDRODIURIL) 25 MG tablet Take 1 tablet (25 mg total) by mouth daily. 03/25/17   Howard Pouch, MD  naproxen (NAPROSYN) 250 MG tablet Take 1 tablet (250 mg total) by mouth 2 (two) times daily with a meal. 09/24/15   Everlene Farrier, PA-C  ondansetron (ZOFRAN) 4 MG tablet Take 4 mg by mouth every 8 (eight) hours as needed for nausea or vomiting. For nausea/vomiting. 10/20/14   [provider]  oxyCODONE (OXY IR/ROXICODONE) 5 MG immediate release tablet TK 1 T PO Q 8 H 01/17/17   [provider]  promethazine (PHENERGAN) 25 MG suppository  Place 1 suppository (25 mg total) rectally every 6 (six) hours as needed for nausea or vomiting. 08/09/16   Ardith Dark, MD  promethazine (PHENERGAN) 25 MG tablet Take 1 tablet (25 mg total) by mouth every 8 (eight) hours as needed for nausea or vomiting. 08/09/16   Ardith Dark, MD  propranolol ER (INDERAL LA) 80 MG 24 hr capsule TAKE 1 CAPSULE(80 MG) BY MOUTH DAILY 03/28/17   Howard Pouch, MD  sennosides-docusate sodium (SENOKOT-S) 8.6-50 MG tablet Take 1 tablet by mouth daily as needed for constipation.    [provider]  SUMAtriptan (IMITREX) 100 MG tablet Take 1 tablet (100 mg total) by mouth once. May repeat in 2 hours if headache persists or recurs. 01/31/17 01/31/17  Almon Hercules, MD  topiramate (TOPAMAX) 100 MG tablet TAKE 1 AND 1/2 TABLETS(150 MG) BY MOUTH TWICE DAILY 03/23/17   Howard Pouch, MD  verapamil (CALAN-SR) 120 MG CR tablet Take 1 tablet (120 mg total) by mouth at bedtime. 09/02/15   Van Clines, MD    Family History Family History  Problem Relation Age of Onset  . Cancer Mother   . Pancreatic cancer Mother   . Cirrhosis Father   . Lupus Sister   . Diabetes Maternal Grandmother     Social History Social History   Tobacco Use  . Smoking status: Never Smoker  . Smokeless tobacco: Never Used  Substance Use Topics  . Alcohol use: Yes    Comment: occ  . Drug use: No     Allergies   Apple; Aspirin; Carrot [daucus carota]; and Haloperidol and related   Review of Systems Review of Systems  All other systems reviewed and are negative.    Physical Exam Updated Vital Signs BP 138/64 (BP Location: Right Arm)   Pulse (!) 59   Temp 98.2 F (36.8 C) (Oral)   Resp 14   Ht 5\' 5"  (1.651 m)   Wt 92.1 kg (203 lb)   SpO2 100%   BMI 33.78 kg/m   Physical Exam  Nursing note and vitals reviewed.  57 year old female, resting comfortably and in no acute distress. Vital signs are normal. Oxygen saturation is 100%, which is normal. Head is  normocephalic.  Laceration is present in the frontal region of the scalp. PERRLA, EOMI. Oropharynx is clear. Neck is immobilized in a stiff cervical collar and is nontender without adenopathy or JVD. Back is nontender and there is no CVA tenderness. Lungs are clear without rales, wheezes, or rhonchi. Chest is nontender. Heart has regular rate and rhythm without murmur. Abdomen is soft, flat, nontender without masses or hepatosplenomegaly and peristalsis is normoactive. Extremities have no cyanosis or edema, full range of motion is present. Skin is warm and dry without rash. Neurologic: Mental status is normal, cranial nerves are intact, there are no motor or sensory deficits.  ED Treatments / Results  Labs (all labs ordered are listed, but only abnormal results are displayed) Labs Reviewed  BASIC METABOLIC PANEL - Abnormal; Notable for the following components:      Result Value   Potassium 3.0 (*)    Glucose, Bld 137 (*)    Creatinine, Ser 1.40 (*)    GFR calc non Af Amer 42 (*)    GFR calc Af Amer 48 (*)    All other components within normal limits  CBC WITH DIFFERENTIAL/PLATELET    EKG  EKG Interpretation  Date/Time:  Wednesday July 06 2017 01:14:18 EST Ventricular Rate:  57 PR Interval:    QRS Duration: 106 QT Interval:  441 QTC Calculation: 430 R Axis:   42 Text Interpretation:  Sinus rhythm Borderline T abnormalities, anterior leads When compared with ECG of 01/31/2017, No significant change was found Confirmed by Dione Booze ([id number]) on 07/06/2017 1:26:20 AM       Radiology Ct Head Wo Contrast  Result Date: 07/06/2017 CLINICAL DATA:  57 year old female with head trauma. EXAM: CT HEAD WITHOUT CONTRAST CT CERVICAL SPINE WITHOUT CONTRAST TECHNIQUE: Multidetector CT imaging of the head and cervical spine was performed following the standard protocol without intravenous contrast. Multiplanar CT image reconstructions of the cervical spine were also generated. COMPARISON:   Head CT dated 01/11/2014 FINDINGS: CT HEAD FINDINGS Brain: The ventricles and sulci appropriate size for patient's age. Incidental note of cavum septum pellucidum and cavum vergae. The gray-white matter discrimination is preserved. There is no acute intracranial hemorrhage. No mass effect or midline shift. No extra-axial fluid collection. Vascular: No hyperdense vessel or unexpected calcification. Skull: Normal. Negative for fracture or focal lesion. Sinuses/Orbits: No acute finding. Other: None CT CERVICAL SPINE FINDINGS Alignment: No acute subluxation. There is straightening of normal cervical lordosis which may be positional or due to muscle spasm. Skull base and vertebrae: No acute fracture. No primary bone lesion or focal pathologic process. Soft tissues and spinal canal: No prevertebral fluid or swelling. No visible canal hematoma. Disc levels:  Degenerative changes at C6-C7. Upper chest: Negative. Other: There is a 1.4 x 1.7 cm ovoid soft tissue density inferior and posterior to the left lobe of the thyroid gland which may represent extension of the thyroid tissue or an exophytic thyroid versus a parathyroid nodule (series 6, image 78). Further evaluation with thyroid ultrasound recommended. IMPRESSION: 1. No acute intracranial pathology. 2. No acute/traumatic cervical spine pathology. 3. Ovoid soft tissue density inferior to the left lobe of the thyroid gland. Further evaluation with ultrasound recommended. Electronically Signed   By: Elgie Collard M.D.   On: 07/06/2017 02:03   Ct Cervical Spine Wo Contrast  Result Date: 07/06/2017 CLINICAL DATA:  57 year old female with head trauma. EXAM: CT HEAD WITHOUT CONTRAST CT CERVICAL SPINE WITHOUT CONTRAST TECHNIQUE: Multidetector CT imaging of the head and cervical spine was performed following the standard protocol without intravenous contrast. Multiplanar CT image reconstructions of the cervical spine were also generated. COMPARISON:  Head CT dated  01/11/2014 FINDINGS: CT HEAD FINDINGS Brain: The ventricles and sulci appropriate size for patient's age. Incidental note of cavum septum pellucidum and cavum vergae. The gray-white matter discrimination is preserved. There is no acute intracranial hemorrhage. No mass effect or midline shift. No extra-axial fluid collection. Vascular: No hyperdense vessel or unexpected calcification. Skull: Normal. Negative for fracture or focal lesion. Sinuses/Orbits: No acute finding. Other: None CT CERVICAL SPINE FINDINGS Alignment: No acute subluxation. There is straightening of normal cervical lordosis which may  be positional or due to muscle spasm. Skull base and vertebrae: No acute fracture. No primary bone lesion or focal pathologic process. Soft tissues and spinal canal: No prevertebral fluid or swelling. No visible canal hematoma. Disc levels:  Degenerative changes at C6-C7. Upper chest: Negative. Other: There is a 1.4 x 1.7 cm ovoid soft tissue density inferior and posterior to the left lobe of the thyroid gland which may represent extension of the thyroid tissue or an exophytic thyroid versus a parathyroid nodule (series 6, image 78). Further evaluation with thyroid ultrasound recommended. IMPRESSION: 1. No acute intracranial pathology. 2. No acute/traumatic cervical spine pathology. 3. Ovoid soft tissue density inferior to the left lobe of the thyroid gland. Further evaluation with ultrasound recommended. Electronically Signed   By: Elgie Collard M.D.   On: 07/06/2017 02:03    Procedures .Marland KitchenLaceration Repair Date/Time: 07/06/2017 2:17 AM Performed by: Dione Booze, MD Authorized by: Dione Booze, MD   Consent:    Consent obtained:  Verbal   Consent given by:  Patient   Risks discussed:  Infection, pain and poor cosmetic result   Alternatives discussed:  No treatment and delayed treatment Anesthesia (see MAR for exact dosages):    Anesthesia method:  None Laceration details:    Location:  Scalp   Scalp  location:  Frontal   Length (cm):  3   Depth (mm):  3 Repair type:    Repair type:  Simple Pre-procedure details:    Preparation:  Patient was prepped and draped in usual sterile fashion and imaging obtained to evaluate for foreign bodies Exploration:    Hemostasis achieved with:  Direct pressure   Wound exploration: entire depth of wound probed and visualized     Contaminated: no   Treatment:    Area cleansed with:  Shur-Clens   Amount of cleaning:  Standard Skin repair:    Repair method:  Staples   Number of staples:  5 Approximation:    Approximation:  Close Post-procedure details:    Dressing:  Open (no dressing)   Patient tolerance of procedure:  Tolerated well, no immediate complications   (including critical care time)  Medications Ordered in ED Medications  sodium chloride 0.9 % bolus 1,000 mL (not administered)  metoCLOPramide (REGLAN) injection 10 mg (not administered)  diphenhydrAMINE (BENADRYL) injection 25 mg (not administered)  dexamethasone (DECADRON) injection 10 mg (not administered)     Initial Impression / Assessment and Plan / ED Course  I have reviewed the triage vital signs and the nursing notes.  Pertinent labs & imaging results that were available during my care of the patient were reviewed by me and considered in my medical decision making (see chart for details).  Migraine headache.  Syncopal episode which appears to probably be vasovagal.  No red flags to suggest more serious causes of syncope.  Based on head injury, will send for CT of head and cervical spine.  Old records are reviewed, and she has multiple ED visits for migraines.  Last Tdap immunization was 2 years ago.  She is given a migraine cocktail.  Laceration is closed with staples.  She had seen her neurologist on January 7 at which time she was started on Fremanezumab-vfrm, and discussion was held regarding referral to [facility].  CT scans are unremarkable other than soft  tissue density which may be accessory thyroid tissue, need for ultrasound for follow-up.  Laboratory workup shows significant hypokalemia which is presumably secondary to hydrochlorothiazide.  As a matter of fact,  all potassium levels checked over the last year have been low.  She clearly needs a significantly higher dose of potassium supplement, or needs to be switched to a combination pill with a potassium sparing agent.  There is also a bump in creatinine.  The hypokalemia and acute kidney injury are concerning in the setting of syncope.  Case is discussed with Dr. Primitivo GauzeFletcher of family practice teaching service who agrees to admit the patient.  Final Clinical Impressions(s) / ED Diagnoses   Final diagnoses:  Syncope, unspecified syncope type  Scalp laceration, initial encounter  Acute kidney injury (nontraumatic) (HCC)  Diuretic-induced hypokalemia    ED Discharge Orders    None       Dione BoozeGlick, Emanuel Dowson, MD 07/06/17 [phone]

## 2017-07-06 NOTE — Progress Notes (Signed)
EEG complete - results pending 

## 2017-07-06 NOTE — ED Notes (Signed)
Patient transported to CT 

## 2017-07-06 NOTE — ED Notes (Signed)
Admitting physicians at bedside.

## 2017-07-06 NOTE — Progress Notes (Signed)
PT Cancellation Note  Patient Details Name: Kirsten Walsh MRN: [id number] DOB: 01/21/1961   Cancelled Treatment:    Reason Eval/Treat Not Completed: Other (comment) Pt sleeping and pt's family requesting to allow pt to rest at this time. Will follow up as schedule allows.   Gladys DammeBrittany Shefali Ng, PT, DPT  Acute Rehabilitation Services  Pager: [phone]    Lehman PromBrittany S Derius Ghosh 07/06/2017, 5:18 PM

## 2017-07-07 DIAGNOSIS — E876 Hypokalemia: Secondary | ICD-10-CM | POA: Diagnosis present

## 2017-07-07 DIAGNOSIS — Z791 Long term (current) use of non-steroidal anti-inflammatories (NSAID): Secondary | ICD-10-CM | POA: Diagnosis not present

## 2017-07-07 DIAGNOSIS — I959 Hypotension, unspecified: Secondary | ICD-10-CM | POA: Diagnosis present

## 2017-07-07 DIAGNOSIS — G8929 Other chronic pain: Secondary | ICD-10-CM | POA: Diagnosis present

## 2017-07-07 DIAGNOSIS — W19XXXA Unspecified fall, initial encounter: Secondary | ICD-10-CM | POA: Diagnosis present

## 2017-07-07 DIAGNOSIS — Z885 Allergy status to narcotic agent status: Secondary | ICD-10-CM | POA: Diagnosis not present

## 2017-07-07 DIAGNOSIS — E049 Nontoxic goiter, unspecified: Secondary | ICD-10-CM | POA: Diagnosis present

## 2017-07-07 DIAGNOSIS — T502X5A Adverse effect of carbonic-anhydrase inhibitors, benzothiadiazides and other diuretics, initial encounter: Secondary | ICD-10-CM | POA: Diagnosis present

## 2017-07-07 DIAGNOSIS — S0101XA Laceration without foreign body of scalp, initial encounter: Secondary | ICD-10-CM | POA: Diagnosis present

## 2017-07-07 DIAGNOSIS — Z79899 Other long term (current) drug therapy: Secondary | ICD-10-CM | POA: Diagnosis not present

## 2017-07-07 DIAGNOSIS — N179 Acute kidney failure, unspecified: Secondary | ICD-10-CM

## 2017-07-07 DIAGNOSIS — R001 Bradycardia, unspecified: Secondary | ICD-10-CM | POA: Diagnosis present

## 2017-07-07 DIAGNOSIS — G43909 Migraine, unspecified, not intractable, without status migrainosus: Secondary | ICD-10-CM | POA: Diagnosis present

## 2017-07-07 DIAGNOSIS — Z91018 Allergy to other foods: Secondary | ICD-10-CM | POA: Diagnosis not present

## 2017-07-07 DIAGNOSIS — E669 Obesity, unspecified: Secondary | ICD-10-CM | POA: Diagnosis present

## 2017-07-07 DIAGNOSIS — Z6833 Body mass index (BMI) 33.0-33.9, adult: Secondary | ICD-10-CM | POA: Diagnosis not present

## 2017-07-07 DIAGNOSIS — Z886 Allergy status to analgesic agent status: Secondary | ICD-10-CM | POA: Diagnosis not present

## 2017-07-07 DIAGNOSIS — I110 Hypertensive heart disease with heart failure: Secondary | ICD-10-CM | POA: Diagnosis present

## 2017-07-07 DIAGNOSIS — R55 Syncope and collapse: Secondary | ICD-10-CM | POA: Diagnosis not present

## 2017-07-07 DIAGNOSIS — I509 Heart failure, unspecified: Secondary | ICD-10-CM | POA: Diagnosis present

## 2017-07-07 LAB — GLUCOSE, CAPILLARY: Glucose-Capillary: 142 mg/dL — ABNORMAL HIGH (ref 65–99)

## 2017-07-07 MED ORDER — OXYCODONE HCL 5 MG PO TABS
5.0000 mg | ORAL_TABLET | Freq: Three times a day (TID) | ORAL | Status: DC
  Administered 2017-07-08: 5 mg via ORAL
  Filled 2017-07-07 (×3): qty 1

## 2017-07-07 MED ORDER — BUTALBITAL-APAP-CAFFEINE 50-325-40 MG PO TABS
1.0000 | ORAL_TABLET | Freq: Four times a day (QID) | ORAL | Status: DC | PRN
  Administered 2017-07-07: 1 via ORAL
  Filled 2017-07-07: qty 1

## 2017-07-07 MED ORDER — PROPRANOLOL HCL 20 MG PO TABS
20.0000 mg | ORAL_TABLET | Freq: Two times a day (BID) | ORAL | Status: DC
  Filled 2017-07-07: qty 1

## 2017-07-07 MED ORDER — OXYCODONE HCL 5 MG PO TABS
5.0000 mg | ORAL_TABLET | Freq: Three times a day (TID) | ORAL | Status: DC | PRN
  Administered 2017-07-07 – 2017-07-08 (×3): 5 mg via ORAL
  Filled 2017-07-07 (×2): qty 1

## 2017-07-07 NOTE — Progress Notes (Signed)
Patients blood pressure 86/47, MD aware. No new orders placed, will continue to monitor patient

## 2017-07-07 NOTE — Evaluation (Signed)
Physical Therapy Evaluation Patient Details Name: Kirsten Walsh MRN: [id number] DOB: 02-Nov-1960 Today's Date: 07/07/2017   History of Present Illness  Kirsten Walsh is a 57 y.o. female with PMH of chronic migraines, chronic pain, HTN, syncope, obesity, CHF, complex regional pain syndrome, weakness on right side from "nerve damepresenting with a fall after a syncopal event that occurred when she was "on the toilet".     Clinical Impression  Pt presented standing at the sink with OT, awake and willing to participate in therapy session. Prior to admission, pt reported that she was independent with all functional mobility and ADLs. Pt works and is in school ([employer]). Pt ambulated in hallway without use of an AD with close min guard to min A for stability. Pt with very slow cadence, cautious gait. Towards end of ambulation pt with acute onset of significant pain in her R forehead and R eye area. Pt's RN was notified. BP was measures with pt in supine at end of session, which was 107/63.   Pt would continue to benefit from skilled physical therapy services at this time while admitted and after d/c to address the below listed limitations in order to improve overall safety and independence with functional mobility.     Follow Up Recommendations Outpatient PT    Equipment Recommendations  None recommended by PT;Other (comment)(pt not wanting to use a RW at this time)    Recommendations for Other Services       Precautions / Restrictions Precautions Precautions: Fall Precaution Comments: +dizziness Restrictions Weight Bearing Restrictions: No      Mobility  Bed Mobility Overal bed mobility: Independent                Transfers Overall transfer level: Needs assistance Equipment used: None Transfers: Sit to/from Stand Sit to Stand: Min guard         General transfer comment: min guard for safety  Ambulation/Gait Ambulation/Gait assistance: Min guard;Min  assist Ambulation Distance (Feet): 100 Feet Assistive device: None;1 person hand held assist Gait Pattern/deviations: Step-through pattern;Decreased step length - right;Decreased step length - left;Decreased stride length;Drifts right/left;Narrow base of support Gait velocity: decreased Gait velocity interpretation: Below normal speed for age/gender General Gait Details: pt with very short step length bilaterally, very slow cadence and cautious; pt with LOB x3 laterally requiring min A to maintain upright; towards end of walk, pt with acute onset of significant pain on R side of forehead and around R eye requiring a sitting rest break   Stairs            Wheelchair Mobility    Modified Rankin (Stroke Patients Only)       Balance Overall balance assessment: Needs assistance Sitting-balance support: No upper extremity supported;Feet supported Sitting balance-Leahy Scale: Good     Standing balance support: During functional activity;No upper extremity supported Standing balance-Leahy Scale: Fair Standing balance comment: pt intermittently holding onto wall on door to feel more steady when up to bathroom and back to bed                             Pertinent Vitals/Pain Pain Assessment: Faces Pain Score: 9  Faces Pain Scale: Hurts whole lot Pain Location: head, R side around eye and forehead Pain Descriptors / Indicators: Throbbing(pulsing) Pain Intervention(s): Monitored during session;Repositioned    Home Living Family/patient expects to be discharged to:: Private residence Living Arrangements: Spouse/significant other Available Help at Discharge: Family(works)  Type of Home: House Home Access: Stairs to enter Entrance Stairs-Rails: Right Entrance Stairs-Number of Steps: 7 Home Layout: Multi-level Home Equipment: None      Prior Function Level of Independence: Independent         Comments: 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     Hand Dominance   Dominant Hand: Right    Extremity/Trunk Assessment   Upper Extremity Assessment Upper Extremity Assessment: Defer to OT evaluation RUE Deficits / Details: generalized weakness from "nerve damage" per pt--but functional    Lower Extremity Assessment Lower Extremity Assessment: RLE deficits/detail RLE Deficits / Details: functional weakness with standing and ambulation    Cervical / Trunk Assessment Cervical / Trunk Assessment: Normal  Communication   Communication: No difficulties  Cognition Arousal/Alertness: Awake/alert Behavior During Therapy: WFL for tasks assessed/performed Overall Cognitive Status: Within Functional Limits for tasks assessed                                        General Comments      Exercises     Assessment/Plan    PT Assessment Patient needs continued PT services  PT Problem List Decreased activity tolerance;Decreased balance;Decreased mobility;Decreased coordination;Decreased safety awareness;Pain       PT Treatment Interventions DME instruction;Gait training;Stair training;Functional mobility training;Therapeutic activities;Therapeutic exercise;Balance training;Neuromuscular re-education;Patient/family education    PT Goals (Current goals can be found in the Care Plan section)  Acute Rehab PT Goals Patient Stated Goal: return to independence PT Goal Formulation: With patient Time For Goal Achievement: 07/21/17 Potential to Achieve Goals: Good    Frequency Min 3X/week   Barriers to discharge        Co-evaluation               AM-PAC PT "6 Clicks" Daily Activity  Outcome Measure Difficulty turning over in bed (including adjusting bedclothes, sheets and blankets)?: None Difficulty moving from lying on back to sitting on the side of the bed? : None Difficulty sitting down on and standing up from a chair with arms (e.g., wheelchair, bedside commode, etc,.)?: A Little Help  needed moving to and from a bed to chair (including a wheelchair)?: A Little Help needed walking in hospital room?: A Little Help needed climbing 3-5 steps with a railing? : A Little 6 Click Score: 20    End of Session Equipment Utilized During Treatment: Gait belt Activity Tolerance: Patient limited by pain Patient left: in bed;with call bell/phone within reach;with family/visitor present Nurse Communication: Mobility status;Other (comment)(acute onset of R forehead pain with ambulation) PT Visit Diagnosis: Other abnormalities of gait and mobility (R26.89);Pain Pain - Right/Left: Right Pain - part of body: (head/eye)    Time: 1002-1040 PT Time Calculation (min) (ACUTE ONLY): 38 min   Charges:   PT Evaluation $PT Eval Moderate Complexity: 1 Mod PT Treatments $Gait Training: 8-22 mins   PT G Codes:   PT G-Codes **NOT FOR INPATIENT CLASS** Functional Assessment Tool Used: AM-PAC 6 Clicks Basic Mobility;Clinical judgement Functional Limitation: Mobility: Walking and moving around Mobility: Walking and Moving Around Current Status ([id number](G8978): At least 20 percent but less than 40 percent impaired, limited or restricted Mobility: Walking and Moving Around Goal Status [id number]([id number]): 0 percent impaired, limited or restricted    [facility] Center LPJennifer Ajmal Kathan, PT, DPT [phone]   Alessandra BevelsJennifer M Lenka Zhao 07/07/2017, 11:04 AM

## 2017-07-07 NOTE — Consult Note (Signed)
ELECTROPHYSIOLOGY CONSULT NOTE    Patient ID: Linard MillersConstance M Delph MRN: [id number], DOB/AGE: 55-Jul-1962 55 y.o.  Admit date: 07/06/2017 Date of Consult: 07/07/2017  Primary Physician: Howard PouchFeng, Lauren, MD Electrophysiologist: Elberta Fortisamnitz  Patient Profile: Linard MillersConstance M Manka is a 57 y.o. female with a history of chronic headaches, chronic pain, HTN, and recurrent syncope who is being seen today for the evaluation of syncope at the request of Dr Linwood Dibblesumball.  HPI:  Linard MillersConstance M Grabill is a 57 y.o. female with the above past medical history. She has a long history of recurrent syncope that occurs both sitting and standing. She has undergone workup in the past including event monitor with no arrhythmias detected. She had a syncopal spell the day of admission while on the toilet and suffered a head laceration. She presented to the ER for further evaluation.  K+ was noted to be low.  Telemetry has demonstrated sinus rhythm with no significant brady or tachy arrhythmias. She has not had recurrent symptoms while admitted.  ILR was previously discussed by Dr Elberta Fortisamnitz but the patient declined at that time. EP has been asked to evaluate for treatment options. She typically has no prodrome with syncopal spells.  She has no chest pain or shortness of breath.   Echo this admission demonstrated EF 60-65%, no RWMA.  She denies chest pain, palpitations, dyspnea, PND, orthopnea, nausea, vomiting, edema, weight gain, or early satiety.  Past Medical History:  Diagnosis Date  . Chest pain   . Chronic lower back pain    "right side to mid back" (07/06/2017)  . Family history of adverse reaction to anesthesia    "daughter did; not sure happened"  . Gall stones   . Hypertension   . Kidney stone ~ 2006  . Migraine    "qd" (07/06/2017)  . Syncope and collapse 07/06/2017    sitting on the commode; developed nausea and passed out; hit head on shower and suffered a laceration; woke up in "a pool of blood"     Surgical  History:  Past Surgical History:  Procedure Laterality Date  . CYSTOSCOPY W/ STONE MANIPULATION  ~ 2006  . INTRAUTERINE DEVICE INSERTION     "initially put in in 04/2002; changed prn" (02/14/2013)  . LAPAROSCOPIC CHOLECYSTECTOMY  ~ 2004     Medications Prior to Admission  Medication Sig Dispense Refill Last Dose  . butalbital-acetaminophen-caffeine (FIORICET, ESGIC) 50-325-40 MG tablet Take 1 tablet by mouth every 6 (six) hours as needed for headache or migraine.     . chlorproMAZINE (THORAZINE) 10 MG tablet Take 1 tablet (10 mg total) by mouth 3 (three) times daily. 10 tablet 0 07/05/2017 at Unknown time  . cloNIDine (CATAPRES) 0.1 MG tablet Take 1 tablet (0.1 mg total) by mouth 2 (two) times daily. 60 tablet 11 07/05/2017 at Unknown time  . EPIPEN 2-PAK 0.3 MG/0.3ML SOAJ injection INJECT 1 SYRINGE IN THE MUSCLE ONCE (Patient taking differently: INJECT 1 SYRINGE IN THE MUSCLE AS NEEDED FOR ALLERGIC REACTION) 2 Device 0 unknown  . Fremanezumab-vfrm (AJOVY) 225 MG/1.5ML SOSY Inject 1 Syringe into the skin every 30 (thirty) days. 3 Syringe 0 Past Month at Unknown time  . hydrochlorothiazide (HYDRODIURIL) 25 MG tablet Take 1 tablet (25 mg total) by mouth daily. 60 tablet 5 07/05/2017 at Unknown time  . oxyCODONE (OXY IR/ROXICODONE) 5 MG immediate release tablet Take 5 mg by mouth 3 (three) times daily.     . promethazine (PHENERGAN) 25 MG tablet Take 1 tablet (25 mg total) by mouth  every 8 (eight) hours as needed for nausea or vomiting. 20 tablet 0 07/05/2017 at Unknown time  . propranolol ER (INDERAL LA) 80 MG 24 hr capsule TAKE 1 CAPSULE(80 MG) BY MOUTH DAILY 90 capsule 0 07/05/2017 at 0800  . SUMAtriptan (IMITREX) 100 MG tablet Take 1 tablet (100 mg total) by mouth once. May repeat in 2 hours if headache persists or recurs. 10 tablet 1 07/05/2017 at Unknown time  . topiramate (TOPAMAX) 100 MG tablet TAKE 1 AND 1/2 TABLETS(150 MG) BY MOUTH TWICE DAILY 90 tablet 3 07/05/2017 at Unknown time  .  diphenhydrAMINE (BENADRYL) 25 MG tablet Take 1 tablet (25 mg total) by mouth every 6 (six) hours as needed. (Patient not taking: Reported on 07/06/2017) 30 tablet 0 Not Taking at Unknown time  . naproxen (NAPROSYN) 250 MG tablet Take 1 tablet (250 mg total) by mouth 2 (two) times daily with a meal. (Patient not taking: Reported on 07/06/2017) 30 tablet 0 Completed Course at Unknown time  . promethazine (PHENERGAN) 25 MG suppository Place 1 suppository (25 mg total) rectally every 6 (six) hours as needed for nausea or vomiting. (Patient not taking: Reported on 07/06/2017) 12 each 0 Not Taking at Unknown time  . verapamil (CALAN-SR) 120 MG CR tablet Take 1 tablet (120 mg total) by mouth at bedtime. (Patient not taking: Reported on 07/06/2017) 30 tablet 11 Not Taking at Unknown time    Inpatient Medications:  . chlorproMAZINE  10 mg Oral TID  . cloNIDine  0.05 mg Oral BID   Followed by  . [START ON 07/08/2017] cloNIDine  0.05 mg Oral Daily  . enoxaparin (LOVENOX) injection  0.5 mg/kg Subcutaneous Q24H  . oxyCODONE  5 mg Oral Q8H  . propranolol  20 mg Oral BID  . sodium chloride flush  3 mL Intravenous Q12H  . topiramate  150 mg Oral Daily    Allergies:  Allergies  Allergen Reactions  . Apple Anaphylaxis  . Aspirin Anaphylaxis  . Carrot [Daucus Carota] Anaphylaxis  . Haloperidol And Related Other (See Comments)    Lock jaw and slurred speech    Social History   Socioeconomic History  . Marital status: Single    Spouse name: Not on file  . Number of children: 4  . Years of education: Not on file  . Highest education level: Associate degree: academic program  Social Needs  . Financial resource strain: Not on file  . Food insecurity - worry: Not on file  . Food insecurity - inability: Not on file  . Transportation needs - medical: Not on file  . Transportation needs - non-medical: Not on file  Occupational History  . Not on file  Tobacco Use  . Smoking status: Never Smoker  .  Smokeless tobacco: Never Used  Substance and Sexual Activity  . Alcohol use: Yes    Comment: 07/06/2017 "couple mixed drinks/month"  . Drug use: No  . Sexual activity: Yes    Birth control/protection: IUD  Other Topics Concern  . Not on file  Social History Narrative   Lives at home with a child   Right handed   Drinks occasional caffeine     Family History  Problem Relation Age of Onset  . Cancer Mother   . Pancreatic cancer Mother   . Cirrhosis Father   . Lupus Sister   . Diabetes Maternal Grandmother      Review of Systems: All other systems reviewed and are otherwise negative except as noted above.  Physical Exam:  Vitals:   07/06/17 2217 07/07/17 0219 07/07/17 0559 07/07/17 0948  BP: (!) 96/59 (!) 105/56 104/64 106/66  Pulse: (!) 56 (!) 55 (!) 56 62  Resp: 16 16 15 18   Temp: 98 F (36.7 C) 97.9 F (36.6 C) 98.2 F (36.8 C) 98.6 F (37 C)  TempSrc: Oral Oral Oral Oral  SpO2: 99% 100% 100% 100%  Weight:      Height:        GEN- The patient is well appearing, alert and oriented x 3 today.   HEENT: normocephalic, atraumatic; sclera clear, conjunctiva pink; hearing intact; oropharynx clear; neck supple Lungs- Clear to ausculation bilaterally, normal work of breathing.  No wheezes, rales, rhonchi Heart- Regular rate and rhythm, no murmurs, rubs or gallops GI- soft, non-tender, non-distended, bowel sounds present Extremities- no clubbing, cyanosis, or edema; DP/PT/radial pulses 2+ bilaterally MS- no significant deformity or atrophy Skin- warm and dry, no rash or lesion Psych- euthymic mood, full affect Neuro- strength and sensation are intact  Labs:   Lab Results  Component Value Date   WBC 6.2 07/06/2017   HGB 14.3 07/06/2017   HCT 42.3 07/06/2017   MCV 91.6 07/06/2017   PLT 222 07/06/2017    Recent Labs  Lab 07/06/17 1843  NA 138  K 3.7  CL 109  CO2 19*  BUN 10  CREATININE 1.18*  CALCIUM 9.2  GLUCOSE 145*      Radiology/Studies: Ct Head Wo  Contrast  Result Date: 07/06/2017 CLINICAL DATA:  57 year old female with head trauma. EXAM: CT HEAD WITHOUT CONTRAST CT CERVICAL SPINE WITHOUT CONTRAST TECHNIQUE: Multidetector CT imaging of the head and cervical spine was performed following the standard protocol without intravenous contrast. Multiplanar CT image reconstructions of the cervical spine were also generated. COMPARISON:  Head CT dated 01/11/2014 FINDINGS: CT HEAD FINDINGS Brain: The ventricles and sulci appropriate size for patient's age. Incidental note of cavum septum pellucidum and cavum vergae. The gray-white matter discrimination is preserved. There is no acute intracranial hemorrhage. No mass effect or midline shift. No extra-axial fluid collection. Vascular: No hyperdense vessel or unexpected calcification. Skull: Normal. Negative for fracture or focal lesion. Sinuses/Orbits: No acute finding. Other: None CT CERVICAL SPINE FINDINGS Alignment: No acute subluxation. There is straightening of normal cervical lordosis which may be positional or due to muscle spasm. Skull base and vertebrae: No acute fracture. No primary bone lesion or focal pathologic process. Soft tissues and spinal canal: No prevertebral fluid or swelling. No visible canal hematoma. Disc levels:  Degenerative changes at C6-C7. Upper chest: Negative. Other: There is a 1.4 x 1.7 cm ovoid soft tissue density inferior and posterior to the left lobe of the thyroid gland which may represent extension of the thyroid tissue or an exophytic thyroid versus a parathyroid nodule (series 6, image 78). Further evaluation with thyroid ultrasound recommended. IMPRESSION: 1. No acute intracranial pathology. 2. No acute/traumatic cervical spine pathology. 3. Ovoid soft tissue density inferior to the left lobe of the thyroid gland. Further evaluation with ultrasound recommended. Electronically Signed   By: Elgie Collard M.D.   On: 07/06/2017 02:03   Ct Cervical Spine Wo Contrast  Result  Date: 07/06/2017 CLINICAL DATA:  57 year old female with head trauma. EXAM: CT HEAD WITHOUT CONTRAST CT CERVICAL SPINE WITHOUT CONTRAST TECHNIQUE: Multidetector CT imaging of the head and cervical spine was performed following the standard protocol without intravenous contrast. Multiplanar CT image reconstructions of the cervical spine were also generated. COMPARISON:  Head CT dated 01/11/2014 FINDINGS: CT  HEAD FINDINGS Brain: The ventricles and sulci appropriate size for patient's age. Incidental note of cavum septum pellucidum and cavum vergae. The gray-white matter discrimination is preserved. There is no acute intracranial hemorrhage. No mass effect or midline shift. No extra-axial fluid collection. Vascular: No hyperdense vessel or unexpected calcification. Skull: Normal. Negative for fracture or focal lesion. Sinuses/Orbits: No acute finding. Other: None CT CERVICAL SPINE FINDINGS Alignment: No acute subluxation. There is straightening of normal cervical lordosis which may be positional or due to muscle spasm. Skull base and vertebrae: No acute fracture. No primary bone lesion or focal pathologic process. Soft tissues and spinal canal: No prevertebral fluid or swelling. No visible canal hematoma. Disc levels:  Degenerative changes at C6-C7. Upper chest: Negative. Other: There is a 1.4 x 1.7 cm ovoid soft tissue density inferior and posterior to the left lobe of the thyroid gland which may represent extension of the thyroid tissue or an exophytic thyroid versus a parathyroid nodule (series 6, image 78). Further evaluation with thyroid ultrasound recommended. IMPRESSION: 1. No acute intracranial pathology. 2. No acute/traumatic cervical spine pathology. 3. Ovoid soft tissue density inferior to the left lobe of the thyroid gland. Further evaluation with ultrasound recommended. Electronically Signed   By: Elgie Collard M.D.   On: 07/06/2017 02:03    ZOX:WRUEA rhythm (personally reviewed)  TELEMETRY: sinus  rhtyhm (personally reviewed)   Assessment/Plan: 1.  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  With no prodrome, can not completely rule out arrhythmic cause. She has had no recurrence wile admitted. With frequent episodes, discussed repeat event monitor vs ILR implant. She would like to try medication adjustments made this admission and then discuss with Dr Elberta Fortis further in office (will arrange) Lifestyle modifications reviewed today - adequate hydration (agree with stopping HCTZ), rising slowly, consider abdominal binder  Outpatient EP follow up arranged. Ok from our standpoint to discharge home.  Signed, Gypsy Balsam, NP 07/07/2017 3:26 PM  I have seen, examined the patient, and reviewed the above assessment and plan.  Changes to above are made where necessary.  On exam, sleeping but rouses, NAD.  Her concern today is with migraines.  She has had recurrent syncope.  EKG and echo are without high risk features.  History is suggestive of likely vagal syncope and complicated by migraines.  She is well known to Dr Elberta Fortis.  She would not like to proceed with loop recorder today.  She would prefer to follow-up with Dr Elberta Fortis in the office.  No driving given recurrent syncope.  Co Sign: Hillis Range, MD 07/07/2017 5:56 PM

## 2017-07-07 NOTE — Discharge Summary (Signed)
Family Medicine Teaching [facility] Discharge Summary  Patient name: Kirsten Walsh Medical record number: [id number] Date of birth: [birth year] Age: 57 y.o. 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 Cardiovascular: RRR, no murmurs/rubs/gallop Respiratory: CTAB, no wheezes/rales/rhonchi Abdomen: NTND, soft Extremities: warm and well perfused, +DP pulses  Brief Hospital Course:  Kirsten Walsh a 57 y.o.femalewith PMH of chronic migraines, chronic pain, HTN, syncope, obesity, CHFwho presented on 07/06/17 witha fall after a syncopal event that occurred when she was "on the toilet." She said that she "woke up in a pool of blood". No post-ictal state or any s/s associated with seizures. Her only prodromal symptoms is that she grew hot and felt her heart beating rapidly prior to the episode.  Syncope Patient underwent echo and EEG which were normal. She had multiple normal Ekgs during her admission. She was seen by Cardiology who recommended placing a loop recorder. Patient refused and preferred to have this done as an outpatient. She has a scheduled follow up on 2/4 for evaluation for this.  Hypotension/Bradycardia Patient was noted to be hypotensive into the 80s systolic and brady into the low 50s during her admission. Her propranolol was held which did help her pressures stay in the low 100s systolic and HR in to the low 40J. This medication was held on  discharge.  Migraines Patient has terrible migraines at baseline. She received a cocktail in the ED. Her migraines were well controlled during her stay.  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 Recommended getting an outpatient ultrasound after discharge. TSH normal.  Issues for Follow Up:  1. Follow up resolution of migraines 2. Follow up syncopal episodes, ensure resolution 3. Consider ultrasound study for further workup of thyroid mass 4. Follow up blood pressure and heart rate off of propranolol  Significant Procedures: Echo, EEG, Lac repair  Significant Labs and Imaging:  Recent Labs  Lab 07/06/17 0103  WBC 6.2  HGB 14.3  HCT 42.3  PLT 222   Recent Labs  Lab 07/06/17 0103 07/06/17 0620 07/06/17 1843  NA 140  --  138  K 3.0*  --  3.7  CL 106  --  109  CO2 22  --  19*  GLUCOSE 137*  --  145*  BUN 13  --  10  CREATININE 1.40*  --  1.18*  CALCIUM 9.5  --  9.2  MG  --  2.0  --   PHOS  --  1.8*  --     Results/Tests Pending at Time of Discharge:   Discharge Medications:  Allergies as of 07/08/2017      Reactions   Apple Anaphylaxis   Aspirin Anaphylaxis   Carrot [daucus Carota] Anaphylaxis   Haloperidol And Related Other (See Comments)   Lock jaw and slurred speech      Medication List    STOP taking these medications   propranolol ER 80 MG 24 hr capsule Commonly known as:  INDERAL LA   verapamil 120 MG CR tablet Commonly known as:  CALAN-SR  TAKE these medications   butalbital-acetaminophen-caffeine 50-325-40 MG tablet Commonly known as:  FIORICET, ESGIC Take 1 tablet by mouth every 6 (six) hours as needed for headache or migraine.   chlorproMAZINE 10 MG tablet Commonly known as:  THORAZINE Take 1 tablet (10 mg total) by mouth 3 (three) times daily.   cloNIDine 0.1 MG tablet Commonly known as:  CATAPRES Take 1 tablet (0.1 mg total) by mouth 2 (two) times daily. What changed:  Another medication  with the same name was added. Make sure you understand how and when to take each.   cloNIDine 0.1 MG tablet Commonly known as:  CATAPRES Take 0.5 tablets (0.05 mg total) by mouth 2 (two) times daily for 2 days. What changed:  You were already taking a medication with the same name, and this prescription was added. Make sure you understand how and when to take each.   cloNIDine 0.1 MG tablet Commonly known as:  CATAPRES Take 0.5 tablets (0.05 mg total) by mouth daily. Start taking on:  07/09/2017 What changed:  You were already taking a medication with the same name, and this prescription was added. Make sure you understand how and when to take each.   diphenhydrAMINE 25 MG tablet Commonly known as:  BENADRYL Take 1 tablet (25 mg total) by mouth every 6 (six) hours as needed.   EPIPEN 2-PAK 0.3 mg/0.3 mL Soaj injection Generic drug:  EPINEPHrine INJECT 1 SYRINGE IN THE MUSCLE ONCE What changed:  See the new instructions.   Fremanezumab-vfrm 225 MG/1.5ML Sosy Commonly known as:  AJOVY Inject 1 Syringe into the skin every 30 (thirty) days.   hydrochlorothiazide 25 MG tablet Commonly known as:  HYDRODIURIL Take 1 tablet (25 mg total) by mouth daily.   naproxen 250 MG tablet Commonly known as:  NAPROSYN Take 1 tablet (250 mg total) by mouth 2 (two) times daily with a meal.   oxyCODONE 5 MG immediate release tablet Commonly known as:  Oxy IR/ROXICODONE Take 5 mg by mouth 3 (three) times daily.   promethazine 25 MG tablet Commonly known as:  PHENERGAN Take 1 tablet (25 mg total) by mouth every 8 (eight) hours as needed for nausea or vomiting.   promethazine 25 MG suppository Commonly known as:  PHENERGAN Place 1 suppository (25 mg total) rectally every 6 (six) hours as needed for nausea or vomiting.   SUMAtriptan 100 MG tablet Commonly known as:  IMITREX Take 1 tablet (100 mg total) by mouth once. May repeat in 2 hours if headache persists or recurs.   topiramate 100 MG  tablet Commonly known as:  TOPAMAX TAKE 1 AND 1/2 TABLETS(150 MG) BY MOUTH TWICE DAILY            Durable Medical Equipment  (From admission, onward)        Start     Ordered   07/08/17 1458  For home use only DME Shower stool  Once    Comments:  Shower stool with back   07/08/17 1457      Discharge Instructions: Please refer to Patient Instructions section of EMR for full details.  Patient was counseled important signs and symptoms that should prompt return to medical care, changes in medications, dietary instructions, activity restrictions, and follow up appointments.   Follow-Up Appointments: Follow-up Information    [facility]. Go on 07/19/2017.   Why:  Go to appt at 9:10 AM. Please arrive 15 mins early and bring your medications. Contact information: [address]  [facility] [place] [address] [phone]       [facility] [employer]. Go on 07/25/2017.   Specialty:  Cardiology Why:  Go to appt at 10:30 AM. Please arrive 15 mins early and bring your medications. Contact information: [address], [address] [place] [address] [phone]          Myrene Buddy, MD 07/08/2017, 3:13 PM PGY-1, [facility]

## 2017-07-07 NOTE — Progress Notes (Signed)
I have interviewed and examined the patient.  I have discussed the case and verified the key findings with Dr. Linwood Dibblesumball.   I agree with their assessments and plans as documented in their progress note.  Patient admission for syncopal event after rising from commode. Syncopal fall with striking head again bathtub and sustaining a forehead laceration requiring primary closure in ED.    Has had multiple syncopal events while ambulating +/- association with worsening of Chronic Daily Headache, including passing out walking in school halls and in school parking lot in last couple months.  O-statics are normal.   Pulse rates in bradycardic range.    Summary review of recent past EMR record Seen by in follow up visit Dr Elberta Fortisamnitz ([facility]) for recurrent syncopal events  on 09/16/15 when patient fitted with 30 day montior, and  11/11/15  Where Dr Elberta Fortisamnitz discussed possible  Linq monitor for long-term monitoring for dysrhythmias related to her syncopal events.   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 Dr Lucia GaskinsAhern began CGRP receptor antibody therapy Arnetha Massy(Ajovy) with injection on 06/27/17.  Recommendation to [facility] providers to taper down Fioricet and other meds.  Autonomic Diagnostic Testing in 2107 by Dr Memory ArgueKwadwo Dakwa [facility] showed abnormal response to autonomic challenges (DB, Valsalva or standing) suggesting autonomic dysfunction.   A/ Recurrent Syncope - Syncope with trauma head lacertation.  Occurred with rising from commode. Negative O-statics (as in past at Dr RadioShackCamnitz's office) though obtained after fluid in ED and on floor.  Syncope has occurred in the bathroom in the past. - Possibly medication-related  syncope with component of autonomic dysfunction.  - Patient interested in discussing usefulness of placement of Linq monitor which was discussed with her by Dr Elberta Fortisamnitz in 2017 office visit.   P/ Ambulate in hall with supervision     [facility] to see if Linq monitor is indicated.      Discussed slow transitions with standing.     If her verapamil for migraine prophylaxis is not helping much, then discuss with patient   stopping it given her bradycardia and lower BPs in setting of possible autonomic dysfunction.

## 2017-07-07 NOTE — Evaluation (Signed)
Occupational Therapy Evaluation and Discharge Patient Details Name: Kirsten MillersConstance M Walsh MRN: [id number] DOB: October 08, 1960 Today's Date: 07/07/2017    History of Present Illness Kirsten Walsh is a 57 y.o. female with PMH of chronic migraines, chronic pain, HTN, syncope, obesity, CHF, complex regional pain syndrome, weakness on right side from "nerve damepresenting with a fall after a syncopal event that occurred when she was "on the toilet".    Clinical Impression   This 57 yo female admitted with above presents to acute OT with decreased balance and thus feeling tentative when up on her feet--she does better when taking transitions slowly and also tends to move more slowly due to unsteadiness. Feel the more she is up and about the better she will move. No further OT needs, we will sign off.     Follow Up Recommendations  No OT follow up;Other (comment)(24/7 S prn A for at least first couple of days )    Equipment Recommendations  Tub/shower seat;Other (comment)(pt has Medicaid so should be able to get)       Precautions / Restrictions Precautions Precautions: Fall Precaution Comments: Did state she felt a little dizzy with supine to sit, and sit>stand; as well as on one occassion when getting up from toilet she felt really dizzy (BP taken once seated and 125/71). When she got up the second time from toilet she did it slower and no dizziness Restrictions Weight Bearing Restrictions: No      Mobility Bed Mobility Overal bed mobility: Independent                Transfers Overall transfer level: Needs assistance   Transfers: Sit to/from Stand Sit to Stand: Min guard              Balance Overall balance assessment: Needs assistance Sitting-balance support: No upper extremity supported;Feet supported Sitting balance-Leahy Scale: Good     Standing balance support: Single extremity supported Standing balance-Leahy Scale: Poor Standing balance comment: pt  intermittently holding onto wall on door to feel more steady when up to bathroom and back to bed                           ADL either performed or assessed with clinical judgement   ADL Overall ADL's : Needs assistance/impaired Eating/Feeding: Independent;Sitting   Grooming: Min guard;Standing   Upper Body Bathing: Set up;Sitting   Lower Body Bathing: Min guard;Sit to/from stand   Upper Body Dressing : Set up;Sitting   Lower Body Dressing: Min guard;Sit to/from stand   Toilet Transfer: Min guard;Ambulation;Regular Toilet   Toileting- ArchitectClothing Manipulation and Hygiene: Min guard;Sitting/lateral lean         General ADL Comments: Min guard A in standing and with transfers due to pt does not feel totally steady on her feet and admitted with syncopal episode. Recommended to pt that she use a shower seat at least short term for safety due to her feeling unsteady on her feet     Vision Baseline Vision/History: Wears glasses Wears Glasses: Reading only Patient Visual Report: No change from baseline              Pertinent Vitals/Pain Pain Assessment: 0-10 Pain Score: 9  Pain Location: head Pain Descriptors / Indicators: Aching;Sore Pain Intervention(s): Limited activity within patient's tolerance;Premedicated before session     Hand Dominance Right   Extremity/Trunk Assessment Upper Extremity Assessment Upper Extremity Assessment: RUE deficits/detail RUE Deficits / Details: generalized weakness from "  nerve damage" per pt--but functional           Communication Communication Communication: No difficulties   Cognition Arousal/Alertness: Awake/alert Behavior During Therapy: WFL for tasks assessed/performed Overall Cognitive Status: Within Functional Limits for tasks assessed                                                Home Living Family/patient expects to be discharged to:: Private residence Living Arrangements:  Spouse/significant other Available Help at Discharge: Family(works) Type of Home: House Home Access: Stairs to enter [employer] of Steps: 7 Entrance Stairs-Rails: Right Home Layout: Multi-level Alternate Level Stairs-Number of Steps: 7 (rail on one side),7(no rails)   Bathroom Shower/Tub: Producer, television/film/video: Standard     Home Equipment: None          Prior Functioning/Environment Level of Independence: Independent        Comments: 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        OT Problem List: Decreased strength;Pain;Impaired balance (sitting and/or standing)      OT Treatment/Interventions:      OT Goals(Current goals can be found in the care plan section) Acute Rehab OT Goals Patient Stated Goal: to get back to classes  OT Frequency:                AM-PAC PT "6 Clicks" Daily Activity     Outcome Measure Help from another person eating meals?: None Help from another person taking care of personal grooming?: A Little Help from another person toileting, which includes using toliet, bedpan, or urinal?: A Little Help from another person bathing (including washing, rinsing, drying)?: A Little Help from another person to put on and taking off regular upper body clothing?: A Little Help from another person to put on and taking off regular lower body clothing?: A Little 6 Click Score: 19   End of Session Nurse Communication: (pt requesting to get a shower)  Activity Tolerance: Patient tolerated treatment well Patient left: (sitting EOB getting ready to work with PT)  OT Visit Diagnosis: Unsteadiness on feet (R26.81)                Time: 1610-9604 OT Time Calculation (min): 36 min Charges:  OT General Charges $OT Visit: 1 Visit OT Evaluation $OT Eval Moderate Complexity: 1 Mod OT Treatments $Self Care/Home Management : 8-22 mins

## 2017-07-08 DIAGNOSIS — E876 Hypokalemia: Secondary | ICD-10-CM

## 2017-07-08 DIAGNOSIS — N179 Acute kidney failure, unspecified: Secondary | ICD-10-CM

## 2017-07-08 DIAGNOSIS — T502X5A Adverse effect of carbonic-anhydrase inhibitors, benzothiadiazides and other diuretics, initial encounter: Secondary | ICD-10-CM

## 2017-07-08 DIAGNOSIS — S0101XA Laceration without foreign body of scalp, initial encounter: Secondary | ICD-10-CM

## 2017-07-08 LAB — GLUCOSE, CAPILLARY: Glucose-Capillary: 99 mg/dL (ref 65–99)

## 2017-07-08 MED ORDER — CLONIDINE HCL 0.1 MG PO TABS: 0.0500 mg | ORAL_TABLET | Freq: Every day | ORAL | 0 refills | Status: DC

## 2017-07-08 MED ORDER — CLONIDINE HCL 0.1 MG PO TABS: 0.0500 mg | ORAL_TABLET | Freq: Two times a day (BID) | ORAL | 0 refills | Status: DC

## 2017-07-08 MED ORDER — KETOROLAC TROMETHAMINE 15 MG/ML IJ SOLN: 15.0000 mg | Freq: Once | INTRAMUSCULAR | Status: DC

## 2017-07-08 MED ORDER — KETOROLAC TROMETHAMINE 15 MG/ML IJ SOLN
15.0000 mg | Freq: Once | INTRAMUSCULAR | Status: AC
  Administered 2017-07-08: 15 mg via INTRAVENOUS
  Filled 2017-07-08: qty 1

## 2017-07-08 NOTE — Progress Notes (Signed)
Patient discharged to home, IV removed, AVS reviewed, patient left floor via wheelchair with staff

## 2017-07-08 NOTE — Care Management Note (Signed)
Case Management Note  Patient Details  Name: Kirsten Walsh MRN: [id number] Date of Birth: 27-Jun-1960  Subjective/Objective:     syncope               Action/Plan: Contacted [facility] for shower stool for home. Pt states she can afford medications.   PCP Howard PouchFENG, LAUREN    Expected Discharge Date:                  Expected Discharge Plan:  Home/Self Care  In-House Referral:  NA  Discharge planning Services  CM Consult  Post Acute Care Choice:  NA Choice offered to:  NA  DME Arranged:  Shower stool DME Agency:  [facility]  HH Arranged:  NA HH Agency:  NA  Status of Service:  Completed, signed off  If discussed at Long Length of Stay Meetings, dates discussed:    Additional Comments:  Elliot CousinShavis, Odelle Kosier Ellen, RN 07/08/2017, 2:06 PM

## 2017-07-08 NOTE — Discharge Instructions (Signed)
You were admitted for workup of your fainting spells. While we think it is likely due to something called vasovagal syncope, we cannot rule out a cardiac cause. For this you will need to discuss having a loop recorder placed by cardiology on 2/4. You have a follow up appointment scheduled with Dr. Nelson ChimesAmin on 1/25 at 1:30.  Regarding your medications. Please stop taking your propranolol as your heart rate and your blood pressure were both low while admitted. Please take the follow taper for your clonidine. 1/18 .1mg  two times per day 1/19 0.05 mg two times per day 1/20 0.05 mg two times per day 1/21 0.05 mg once per day 1/23 0.05 mg once per day

## 2017-07-14 ENCOUNTER — Telehealth: Payer: Self-pay | Admitting: Neurology

## 2017-07-14 NOTE — Telephone Encounter (Signed)
Rn call Idalia Needleaige at [facility] contact number [phone]. Idalia Needleaige was not available. Rn spoke with Madelin Rearillon. Madelin RearDillon stated Dr. Lucia GaskinsAhern made a referral to [facility] on 06/29/2017 for a consultation. Madelin RearDillon stated once the referral was sent to [facility] they forward the pts referral, reason for referral,and pts insurance information to [employer] for insurance processing. He stated the reason for denial was medical necessity. He does not have anymore information. If our office or Dr.Ahern want more information on the denial they can e mail Dr. Georgann Housekeeperavid Attarain. His email address is [email or web address].  Dr Deno EtienneAttarian if Dr.Ahern has anymore questions. Message will be sent to Dr. Lucia GaskinsAhern for review.

## 2017-07-14 NOTE — Telephone Encounter (Signed)
Paige with [facility] is calling wanting to give us an e-mail since the referral was denied [email or web address] or call Paige at [phone] for more information

## 2017-07-15 ENCOUNTER — Ambulatory Visit: Payer: Medicaid Other | Admitting: Family Medicine

## 2017-07-15 DIAGNOSIS — R001 Bradycardia, unspecified: Secondary | ICD-10-CM

## 2017-07-15 DIAGNOSIS — S0101XD Laceration without foreign body of scalp, subsequent encounter: Secondary | ICD-10-CM

## 2017-07-15 NOTE — Progress Notes (Signed)
   Subjective:   Patient ID: Kirsten Walsh    DOB: 22-Jul-1960, 57 y.o. female   MRN: [id number]  CC: hospital f/u   HPI: Kirsten Walsh is a 57 y.o. female who presents to clinic today for hospital follow-up.  Syncope/bradycardia  Patient was admitted on 1/16 and discharged on 1/18 for syncopal episode that occurred while she was on the toilet.  She did not have any postictal confusion.  During hospitalization, she underwent echo and EEG which were both normal.  Seen by cardiology who recommended placing a loop recorder.  Patient preferred to have this done as an outpatient.  She has a scheduled follow-up appointment with cardiology on February 4 for evaluation for this.   Propranolol was held on discharge due to bradycardia and hypotension during admission.  Patient states she has been feeling well since discharge and has not had any other syncopal episodes.   Has had some nausea but no vomiting.  States she has been doing well and has not had any dizziness, palpitations, or falls.    Headache Pt reports mild headache after her fall which has improved some.  She endorses pain at the site of head laceration and staples which were done in the ED.  Requesting that stapes be removed today if possible.   ROS: Denies fevers, chills, vomiting.  No dizziness, palpitations, chest pain or shortness of breath.  +Headache.    Social: patient is a never smoker.  Medications reviewed. Objective:   BP 138/80 (BP Location: Right Arm, Patient Position: Sitting, Cuff Size: Normal)   Pulse 87   Temp 97.7 F (36.5 C) (Oral)   Ht 5\' 5"  (1.651 m)   Wt 203 lb 12.8 oz (92.4 kg)   SpO2 100%   BMI 33.91 kg/m  Vitals and nursing note reviewed.  General: 57 yo female, NAD  HEENT: 5 staples in place on anterior scalp at hairline, small amount of dried blood present surrounding staples, without swelling, erythema or signs of infection.  EOMI, PERRL, MMM, o/p clear  Neck: supple, normal ROM  CV: RRR no  MRG  Lungs: CTAB, non-laboured breathing  Abdomen: soft, NTND, +bs  Skin: warm, dry, no rash  Extremities: warm and well perfused Neuro: AOx3, no focal deficits   Assessment & Plan:   Bradycardia HR 87 at OV.  Propranolol held on discharge due to bradycardia and hypotension on admission. Patient asymptomatic, denies palpitations, dizziness, further syncopal episodes.  -Follow up appt with cardiology scheduled for 2/4  -plan to restart propranolol if appropriate at that time -patient to discuss loop recorder with cards as outpatient   Scalp laceration, subsequent encounter Removed scalp laceration staples x5.  Pt tolerated procedure well, no complications.    Freddrick MarchYashika Loron Weimer, MD [facility], PGY-2 07/20/2017 12:09 PM

## 2017-07-15 NOTE — Patient Instructions (Signed)
You were seen in clinic today for a hospital follow-up after your fall.  We removed the staples in your head that were put in at the emergency room and there were no complications with the removal.  I would like for you to take Tylenol or ibuprofen as needed if you continue to have pain from this procedure.  Additionally, we discussed the medication that was held on discharge, propranolol.  Your heart rate in office today is 8087 and I would like for you to continue to not take this medication you can follow-up with your cardiologist, Dr. Elberta Fortisamnitz, on February 4 and at that time he may decide to restart this medication for you.  You develop any new or worsening symptoms, I would like for you to be seen sooner by provider.

## 2017-07-19 ENCOUNTER — Inpatient Hospital Stay: Payer: Medicaid Other | Admitting: Student in an Organized Health Care Education/Training Program

## 2017-07-20 ENCOUNTER — Encounter: Payer: Self-pay | Admitting: Family Medicine

## 2017-07-20 DIAGNOSIS — S0101XD Laceration without foreign body of scalp, subsequent encounter: Secondary | ICD-10-CM | POA: Insufficient documentation

## 2017-07-20 NOTE — Assessment & Plan Note (Signed)
Removed scalp laceration staples x5.  Pt tolerated procedure well, no complications.

## 2017-07-20 NOTE — Assessment & Plan Note (Signed)
HR 87 at OV.  Propranolol held on discharge due to bradycardia and hypotension on admission. Patient asymptomatic, denies palpitations, dizziness, further syncopal episodes.  -Follow up appt with cardiology scheduled for 2/4  -plan to restart propranolol if appropriate at that time -patient to discuss loop recorder with cards as outpatient

## 2017-07-25 ENCOUNTER — Ambulatory Visit: Payer: Medicaid Other | Admitting: Cardiology

## 2017-07-25 ENCOUNTER — Encounter: Payer: Self-pay | Admitting: Cardiology

## 2017-07-25 NOTE — Progress Notes (Deleted)
Electrophysiology Office Note   Date:  07/25/2017   ID:  Gao, Mitnick 21-Aug-1960, MRN [id number]  PCP:  Howard Pouch, MD  Cardiologist:  Regan Lemming, MD    No chief complaint on file.    History of Present Illness: Kirsten Walsh is a 57 y.o. female who presents today for electrophysiology evaluation.   She has a history of chronic headaches, chronic pain, hypertension, and recurrent syncope.  Her recurrent syncopal episodes occurred with both sitting and standing.  She has undergone past workup with monitoring with no arrhythmias detected.  She had a syncopal episode on the day of admission in middle of January 2019 where she was sitting on the toilet.  She fell and had a head laceration.  Telemetry showed sinus rhythm with no bradycardia or tachyarrhythmias.  Previously ILR was discussed but the patient declined.  While in the hospital, she felt that medication changes were indicated and thus declined link monitoring again.   Today, denies symptoms of palpitations, chest pain, shortness of breath, orthopnea, PND, lower extremity edema, claudication, dizziness, presyncope, syncope, bleeding, or neurologic sequela. The patient is tolerating medications without difficulties. ***    Past Medical History:  Diagnosis Date  . Chest pain   . Chronic lower back pain    "right side to mid back" (07/06/2017)  . Family history of adverse reaction to anesthesia    "daughter did; not sure happened"  . Gall stones   . Hypertension   . Kidney stone ~ 2006  . Migraine    "qd" (07/06/2017)  . Syncope and collapse 07/06/2017    sitting on the commode; developed nausea and passed out; hit head on shower and suffered a laceration; woke up in "a pool of blood"   Past Surgical History:  Procedure Laterality Date  . CYSTOSCOPY W/ STONE MANIPULATION  ~ 2006  . INTRAUTERINE DEVICE INSERTION     "initially put in in 04/2002; changed prn" (02/14/2013)  . LAPAROSCOPIC  CHOLECYSTECTOMY  ~ 2004     Current Outpatient Medications  Medication Sig Dispense Refill  . butalbital-acetaminophen-caffeine (FIORICET, ESGIC) 50-325-40 MG tablet Take 1 tablet by mouth every 6 (six) hours as needed for headache or migraine.    . chlorproMAZINE (THORAZINE) 10 MG tablet Take 1 tablet (10 mg total) by mouth 3 (three) times daily. 10 tablet 0  . cloNIDine (CATAPRES) 0.1 MG tablet Take 1 tablet (0.1 mg total) by mouth 2 (two) times daily. 60 tablet 11  . cloNIDine (CATAPRES) 0.1 MG tablet Take 0.5 tablets (0.05 mg total) by mouth 2 (two) times daily for 2 days. 4 tablet 0  . cloNIDine (CATAPRES) 0.1 MG tablet Take 0.5 tablets (0.05 mg total) by mouth daily. 2 tablet 0  . diphenhydrAMINE (BENADRYL) 25 MG tablet Take 1 tablet (25 mg total) by mouth every 6 (six) hours as needed. (Patient not taking: Reported on 07/06/2017) 30 tablet 0  . EPIPEN 2-PAK 0.3 MG/0.3ML SOAJ injection INJECT 1 SYRINGE IN THE MUSCLE ONCE (Patient taking differently: INJECT 1 SYRINGE IN THE MUSCLE AS NEEDED FOR ALLERGIC REACTION) 2 Device 0  . Fremanezumab-vfrm (AJOVY) 225 MG/1.5ML SOSY Inject 1 Syringe into the skin every 30 (thirty) days. 3 Syringe 0  . hydrochlorothiazide (HYDRODIURIL) 25 MG tablet Take 1 tablet (25 mg total) by mouth daily. 60 tablet 5  . naproxen (NAPROSYN) 250 MG tablet Take 1 tablet (250 mg total) by mouth 2 (two) times daily with a meal. (Patient not taking: Reported on 07/06/2017)  30 tablet 0  . oxyCODONE (OXY IR/ROXICODONE) 5 MG immediate release tablet Take 5 mg by mouth 3 (three) times daily.    . promethazine (PHENERGAN) 25 MG suppository Place 1 suppository (25 mg total) rectally every 6 (six) hours as needed for nausea or vomiting. (Patient not taking: Reported on 07/06/2017) 12 each 0  . promethazine (PHENERGAN) 25 MG tablet Take 1 tablet (25 mg total) by mouth every 8 (eight) hours as needed for nausea or vomiting. 20 tablet 0  . SUMAtriptan (IMITREX) 100 MG tablet Take 1 tablet  (100 mg total) by mouth once. May repeat in 2 hours if headache persists or recurs. 10 tablet 1  . topiramate (TOPAMAX) 100 MG tablet TAKE 1 AND 1/2 TABLETS(150 MG) BY MOUTH TWICE DAILY 90 tablet 3   No current facility-administered medications for this visit.     Allergies:   Apple; Aspirin; Carrot [daucus carota]; and Haloperidol and related   Social History:  The patient  reports that  has never smoked. she has never used smokeless tobacco. She reports that she drinks alcohol. She reports that she does not use drugs.   Family History:  The patient's family history includes Cancer in her mother; Cirrhosis in her father; Diabetes in her maternal grandmother; Lupus in her sister; Pancreatic cancer in her mother.   ROS:  Please see the history of present illness.   Otherwise, review of systems is positive for ***.   All other systems are reviewed and negative.   PHYSICAL EXAM: VS:  There were no vitals taken for this visit. , BMI There is no height or weight on file to calculate BMI. GEN: Well nourished, well developed, in no acute distress  HEENT: normal  Neck: no JVD, carotid bruits, or masses Cardiac: ***RRR; no murmurs, rubs, or gallops,no edema  Respiratory:  clear to auscultation bilaterally, normal work of breathing GI: soft, nontender, nondistended, + BS MS: no deformity or atrophy  Skin: warm and dry Neuro:  Strength and sensation are intact Psych: euthymic mood, full affect  EKG:  EKG {ACTION; IS/IS [id number]} ordered today. Personal review of the ekg ordered *** shows ***   Recent Labs: 08/09/2016: ALT 13 07/06/2017: BUN 10; Creatinine, Ser 1.18; Hemoglobin 14.3; Magnesium 2.0; Platelets 222; Potassium 3.7; Sodium 138; TSH 1.244    Lipid Panel     Component Value Date/Time   CHOL 156 08/09/2016 0928   TRIG 78 08/09/2016 0928   HDL 72 08/09/2016 0928   CHOLHDL 2.2 08/09/2016 0928   VLDL 16 08/09/2016 0928   LDLCALC 68 08/09/2016 0928     Wt Readings from Last  3 Encounters:  07/15/17 203 lb 12.8 oz (92.4 kg)  07/07/17 203 lb 5.6 oz (92.2 kg)  06/27/17 210 lb (95.3 kg)      Other studies Reviewed: Additional studies/ records that were reviewed today include: TTE 07/06/17 Review of the above records today demonstrates:  - Left ventricle: The cavity size was normal. Systolic function was   normal. The estimated ejection fraction was in the range of 60%   to 65%. Wall motion was normal; there were no regional wall   motion abnormalities. Left ventricular diastolic function   parameters were normal. - Aortic valve: There was no regurgitation. - Mitral valve: There was trivial regurgitation. - Left atrium: The atrium was normal in size. - Right ventricle: Systolic function was normal. - Right atrium: The atrium was normal in size. - Tricuspid valve: There was trivial regurgitation. - Pulmonary arteries: Systolic  pressure was within the normal   range. - Inferior vena cava: The vessel was normal in size. - Pericardium, extracardiac: There was no pericardial effusion.  Telemetry 30 day 09/26/15 Sinus rhythm No pauses for 3 seconds or longer Average HR 70 BPM  ASSESSMENT AND PLAN:  1.  Recurrent syncope:  ***   Current medicines are reviewed at length with the patient today.   The patient does not have concerns regarding her medicines.  The following changes were made today:  none  Labs/ tests ordered today include:  No orders of the defined types were placed in this encounter.    Disposition:   FU with Marquelle Balow ***  Signed, Krystine Pabst Jorja LoaMartin Britteny Fiebelkorn, MD without warfarin as needed 07/25/2017 8:02 AM     [facility] [address] [place] [address] [phone] (office) [phone] (fax)

## 2017-08-29 ENCOUNTER — Ambulatory Visit (HOSPITAL_COMMUNITY)
Admission: EM | Admit: 2017-08-29 | Discharge: 2017-08-29 | Disposition: A | Discharge status: Home / Self Care | Payer: Medicaid Other | Attending: Family Medicine | Admitting: Family Medicine

## 2017-08-29 ENCOUNTER — Encounter (HOSPITAL_COMMUNITY): Payer: Self-pay | Admitting: Emergency Medicine

## 2017-08-29 DIAGNOSIS — M25562 Pain in left knee: Secondary | ICD-10-CM | POA: Diagnosis not present

## 2017-08-29 MED ORDER — NAPROXEN 375 MG PO TABS: 375.0000 mg | ORAL_TABLET | Freq: Two times a day (BID) | ORAL | 0 refills | Status: DC

## 2017-08-29 MED ORDER — KETOROLAC TROMETHAMINE 30 MG/ML IJ SOLN
INTRAMUSCULAR | Status: AC
  Filled 2017-08-29: qty 1

## 2017-08-29 MED ORDER — KETOROLAC TROMETHAMINE 30 MG/ML IJ SOLN
30.0000 mg | Freq: Once | INTRAMUSCULAR | Status: AC
  Administered 2017-08-29: 30 mg via INTRAMUSCULAR

## 2017-08-29 NOTE — Discharge Instructions (Signed)
Ice and elevate the knee, especially after increased activity. Knee sleeve for compression and support. Naproxen twice a day, take with food. Activity as tolerated. Please follow up with your primary care provider and/or with orthopedics for recheck of symptoms. If develop worsening swelling, redness, pain, numbness, tingling or otherwise worsening return to be seen or go to Er.

## 2017-08-29 NOTE — ED Triage Notes (Signed)
Pt sts left leg pain today upon waking

## 2017-08-29 NOTE — ED Provider Notes (Signed)
[facility]    CSN: [id number] Arrival date & time: 08/29/17  1313     History   Chief Complaint Chief Complaint  Patient presents with  . Leg Pain    HPI Kirsten Walsh is a 57 y.o. female.   Kirsten Walsh presents with complaints of left knee pain which she woke with this morning. She states that when she stretched out her leg she felt it. No known injury or exacerbating trigger yesterday. Denies any previous knee injury. Pain is improved when flexed. Feels more swollen than normal. Has not tried any treatment for pain or taken any medications. Denies any numbness or tingling. Pain seems to radiate up posterior thigh. Denies any pain while sitting at rest. Without numbness or tingling. Hx of migraines, kidney stones, htn, low back pain. Has chronic pain typically to right leg/back. She is ambulatory but pain with ambulation. No recent travel. Does not smoke. Is not on any hormone replacement.   ROS per HPI.       Past Medical History:  Diagnosis Date  . Chest pain   . Chronic lower back pain    "right side to mid back" (07/06/2017)  . Family history of adverse reaction to anesthesia    "daughter did; not sure happened"  . Gall stones   . Hypertension   . Kidney stone ~ 2006  . Migraine    "qd" (07/06/2017)  . Syncope and collapse 07/06/2017    sitting on the commode; developed nausea and passed out; hit head on shower and suffered a laceration; woke up in "a pool of blood"    Patient Active Problem List   Diagnosis Date Noted  . Scalp laceration, subsequent encounter 07/20/2017  . Acute kidney injury (nontraumatic) (HCC)   . Diuretic-induced hypokalemia   . Scalp laceration, initial encounter   . Syncope 07/06/2017  . Healthcare maintenance 08/09/2016  . Chronic daily headache 09/02/2015  . 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 Syncopal episodes 09/02/2015  . Right arm weakness 08/05/2015  . Edema of right arm and right leg 09/07/2014  . IUD contraception  07/18/2014  . History of anaphylaxis 06/26/2014  . Intractable migraine with status migrainosus 06/10/2014  . Chronic migraine without aura, intractable, with status migrainosus   . Bradycardia 02/18/2013  . Cardiomyopathy 10/20/2011  . Decreased ejection fraction 10/05/2011  . Leg swelling 09/23/2011  . Complex regional pain syndrome 05/03/2011  . Chronic pain 03/29/2011  . NECK PAIN, RIGHT 02/03/2010  . ALLERGIC RHINITIS 01/30/2009  . DIZZINESS 07/16/2008  . Chronic migraine 04/26/2007  . OBESITY, NOS 08/18/2006  . HYPERTENSION, BENIGN SYSTEMIC 08/18/2006    Past Surgical History:  Procedure Laterality Date  . CYSTOSCOPY W/ STONE MANIPULATION  ~ 2006  . INTRAUTERINE DEVICE INSERTION     "initially put in in 04/2002; changed prn" (02/14/2013)  . LAPAROSCOPIC CHOLECYSTECTOMY  ~ 2004    OB History    No data available       Home Medications    Prior to Admission medications   Medication Sig Start Date End Date Taking? Authorizing Provider  butalbital-acetaminophen-caffeine (FIORICET, ESGIC) 50-325-40 MG tablet Take 1 tablet by mouth every 6 (six) hours as needed for headache or migraine.    [provider]  chlorproMAZINE (THORAZINE) 10 MG tablet Take 1 tablet (10 mg total) by mouth 3 (three) times daily. 01/31/17   Almon Hercules, MD  cloNIDine (CATAPRES) 0.1 MG tablet Take 1 tablet (0.1 mg total) by mouth 2 (two) times daily. 03/23/17  Howard Pouch, MD  cloNIDine (CATAPRES) 0.1 MG tablet Take 0.5 tablets (0.05 mg total) by mouth 2 (two) times daily for 2 days. 07/08/17 07/10/17  Myrene Buddy, MD  cloNIDine (CATAPRES) 0.1 MG tablet Take 0.5 tablets (0.05 mg total) by mouth daily. 07/09/17   Myrene Buddy, MD  diphenhydrAMINE (BENADRYL) 25 MG tablet Take 1 tablet (25 mg total) by mouth every 6 (six) hours as needed. Patient not taking: Reported on 07/06/2017 01/31/17   Almon Hercules, MD  EPIPEN 2-PAK 0.3 MG/0.3ML SOAJ injection INJECT 1 SYRINGE IN THE MUSCLE  ONCE Patient taking differently: INJECT 1 SYRINGE IN THE MUSCLE AS NEEDED FOR ALLERGIC REACTION 02/03/15   Ardith Dark, MD  Fremanezumab-vfrm (AJOVY) 225 MG/1.5ML SOSY Inject 1 Syringe into the skin every 30 (thirty) days. 06/27/17   Anson Fret, MD  hydrochlorothiazide (HYDRODIURIL) 25 MG tablet Take 1 tablet (25 mg total) by mouth daily. 03/25/17   Howard Pouch, MD  naproxen (NAPROSYN) 375 MG tablet Take 1 tablet (375 mg total) by mouth 2 (two) times daily. 08/29/17   Linus Mako B, NP  oxyCODONE (OXY IR/ROXICODONE) 5 MG immediate release tablet Take 5 mg by mouth 3 (three) times daily.    [provider]  promethazine (PHENERGAN) 25 MG suppository Place 1 suppository (25 mg total) rectally every 6 (six) hours as needed for nausea or vomiting. Patient not taking: Reported on 07/06/2017 08/09/16   Ardith Dark, MD  promethazine (PHENERGAN) 25 MG tablet Take 1 tablet (25 mg total) by mouth every 8 (eight) hours as needed for nausea or vomiting. 08/09/16   Ardith Dark, MD  SUMAtriptan (IMITREX) 100 MG tablet Take 1 tablet (100 mg total) by mouth once. May repeat in 2 hours if headache persists or recurs. 01/31/17 07/06/22  Almon Hercules, MD  topiramate (TOPAMAX) 100 MG tablet TAKE 1 AND 1/2 TABLETS(150 MG) BY MOUTH TWICE DAILY 03/23/17   Howard Pouch, MD    Family History Family History  Problem Relation Age of Onset  . Cancer Mother   . Pancreatic cancer Mother   . Cirrhosis Father   . Lupus Sister   . Diabetes Maternal Grandmother     Social History Social History   Tobacco Use  . Smoking status: Never Smoker  . Smokeless tobacco: Never Used  Substance Use Topics  . Alcohol use: Yes    Comment: 07/06/2017 "couple mixed drinks/month"  . Drug use: No     Allergies   Apple; Aspirin; Carrot [daucus carota]; and Haloperidol and related   Review of Systems Review of Systems   Physical Exam Triage Vital Signs ED Triage Vitals [08/29/17 1423]  Enc Vitals Group      BP (!) 144/94     Pulse Rate 68     Resp 18     Temp 98.4 F (36.9 C)     Temp Source Oral     SpO2 100 %     Weight      Height      Head Circumference      Peak Flow      Pain Score      Pain Loc      Pain Edu?      Excl. in GC?    No data found.  Updated Vital Signs BP (!) 144/94 (BP Location: Right Arm)   Pulse 68   Temp 98.4 F (36.9 C) (Oral)   Resp 18   SpO2 100%   Visual Acuity Right Eye  Distance:   Left Eye Distance:   Bilateral Distance:    Right Eye Near:   Left Eye Near:    Bilateral Near:     Physical Exam  Constitutional: She is oriented to person, place, and time. She appears well-developed and well-nourished. No distress.  Cardiovascular: Normal rate, regular rhythm and normal heart sounds.  Pulmonary/Chest: Effort normal and breath sounds normal.  Musculoskeletal:       Left knee: She exhibits swelling and effusion. She exhibits normal range of motion, no ecchymosis, no deformity, no laceration, no erythema, normal alignment, no LCL laxity, normal patellar mobility, no bony tenderness and no MCL laxity. Tenderness found. Lateral joint line and LCL tenderness noted.  Posterior left knee pain as well as lateral knee swelling and tenderness; without bony tenderness; ambulatory but with limp; sensation intact; mild pain to buttock on palpation; pain increases with knee flexion and extension but complete ROm noted; without increased pain with hip flexion  Neurological: She is alert and oriented to person, place, and time.  Skin: Skin is warm and dry.     UC Treatments / Results  Labs (all labs ordered are listed, but only abnormal results are displayed) Labs Reviewed - No data to display  EKG  EKG Interpretation None       Radiology No results found.  Procedures Procedures (including critical care time)  Medications Ordered in UC Medications  ketorolac (TORADOL) 30 MG/ML injection 30 mg (not administered)     Initial Impression /  Assessment and Plan / UC Course  I have reviewed the triage vital signs and the nursing notes.  Pertinent labs & imaging results that were available during my care of the patient were reviewed by me and considered in my medical decision making (see chart for details).     Without trauma or bony tenderness; imaging deferred; discussed sciatica as with pain to posterior thigh and buttocks as well, but does have localized knee pain with activity and with palpation as well. toradol provided in clinic, naproxen bid, RICE therapy. She states she has received toradol in the past and without allergy. Knee sleeve provided. Encouraged follow up with orthopedics and/or PCP if pain persists. Return precautions provided. Patient verbalized understanding and agreeable to plan.    Final Clinical Impressions(s) / UC Diagnoses   Final diagnoses:  Acute pain of left knee    ED Discharge Orders        Ordered    naproxen (NAPROSYN) 375 MG tablet  2 times daily     08/29/17 1520       Controlled Substance Prescriptions [place] Controlled Substance Registry consulted? Not Applicable   Georgetta HaberBurky, Natalie B, NP 08/29/17 1531

## 2017-08-31 ENCOUNTER — Other Ambulatory Visit: Payer: Self-pay | Admitting: Hematology

## 2017-09-06 ENCOUNTER — Ambulatory Visit (HOSPITAL_COMMUNITY)
Admission: EM | Admit: 2017-09-06 | Discharge: 2017-09-06 | Disposition: A | Discharge status: Home / Self Care | Payer: Medicaid Other | Attending: Family Medicine | Admitting: Family Medicine

## 2017-09-06 ENCOUNTER — Other Ambulatory Visit: Payer: Self-pay

## 2017-09-06 ENCOUNTER — Encounter (HOSPITAL_COMMUNITY): Payer: Self-pay | Admitting: Emergency Medicine

## 2017-09-06 DIAGNOSIS — M25562 Pain in left knee: Secondary | ICD-10-CM

## 2017-09-06 DIAGNOSIS — M5432 Sciatica, left side: Secondary | ICD-10-CM | POA: Diagnosis not present

## 2017-09-06 MED ORDER — PREDNISONE 20 MG PO TABS: ORAL_TABLET | ORAL | 0 refills | Status: DC

## 2017-09-06 NOTE — ED Triage Notes (Addendum)
Seen 3/11.  Left leg did not improve after the visit.  Patient did return to school and work.  Pain is significant with movement.  Pain radiates down buttocks, down left leg .  No known injury

## 2017-09-06 NOTE — ED Provider Notes (Signed)
[facility]   [id number] 09/06/17 Arrival Time: 1640   SUBJECTIVE:  Kirsten Walsh is a 57 y.o. female who presents to the urgent care with complaint of Left leg did not improve after the visit.  Patient did return to school and work.  Pain is significant with movement.  Pain radiates down buttocks, down left leg .  No known injury  Pain radiates down entire left leg.  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 Chest pain   . Chronic lower back pain    "right side to mid back" (07/06/2017)  . Family history of adverse reaction to anesthesia    "daughter did; not sure happened"  . Gall stones   . Hypertension   . Kidney stone ~ 2006  . Migraine    "qd" (07/06/2017)  . Syncope and collapse 07/06/2017    sitting on the commode; developed nausea and passed out; hit head on shower and suffered a laceration; woke up in "a pool of blood"   Family History  Problem Relation Age of Onset  . Cancer Mother   . Pancreatic cancer Mother   . Cirrhosis Father   . Lupus Sister   . Diabetes Maternal Grandmother    Social History   Socioeconomic History  . Marital status: Single    Spouse name: Not on file  . Number of children: 4  . Years of education: Not on file  . Highest education level: Associate degree: academic program  Social Needs  . Financial resource strain: Not on file  . Food insecurity - worry: Not on file  . Food insecurity - inability: Not on file  . Transportation needs - medical: Not on file  . Transportation needs - non-medical: Not on file  Occupational History  . Not on file  Tobacco Use  . Smoking status: Never Smoker  . Smokeless tobacco: Never Used  Substance and Sexual Activity  . Alcohol use: Yes    Comment: 07/06/2017 "couple mixed drinks/month"  . Drug use: No  . Sexual activity: Yes    Birth  control/protection: IUD  Other Topics Concern  . Not on file  Social History Narrative   Lives at home with a child   Right handed   Drinks occasional caffeine   No outpatient medications have been marked as taking for the 09/06/17 encounter [facility] Encounter).   Allergies  Allergen Reactions  . Apple Anaphylaxis  . Aspirin Anaphylaxis  . Carrot [Daucus Carota] Anaphylaxis  . Haloperidol And Related Other (See Comments)    Lock jaw and slurred speech      ROS: As per HPI, remainder of ROS negative.   OBJECTIVE:   Vitals:   09/06/17 1702  BP: (!) 153/82  Pulse: 77  Resp: 20  Temp: 98.6 F (37 C)  TempSrc: Oral  SpO2: 98%     General appearance: alert; no distress Eyes: PERRL; EOMI; conjunctiva normal HENT: normocephalic; atraumatic Neck: supplem Extremities: no cyanosis or edema; symmetrical with no gross deformities Good knee ROM though pain develops with full extension;  Left knee is tender medially with no effusion.  SLR is positive at 45 degrees. No edema;  No calf tenderness, no thigh tenderness. Skin: warm and dry Neurologic: normal gait; grossly normal Psychological: alert and cooperative; normal mood and affect      Labs:  Results  for orders placed or performed during the hospital encounter of 07/06/17  Basic metabolic panel  Result Value Ref Range   Sodium 140 135 - 145 mmol/L   Potassium 3.0 (L) 3.5 - 5.1 mmol/L   Chloride 106 101 - 111 mmol/L   CO2 22 22 - 32 mmol/L   Glucose, Bld 137 (H) 65 - 99 mg/dL   BUN 13 6 - 20 mg/dL   Creatinine, Ser 1.021.40 (H) 0.44 - 1.00 mg/dL   Calcium 9.5 8.9 - 72.510.3 mg/dL   GFR calc non Af Amer 42 (L) >60 mL/min   GFR calc Af Amer 48 (L) >60 mL/min   Anion gap 12 5 - 15  CBC with Differential  Result Value Ref Range   WBC 6.2 4.0 - 10.5 K/uL   RBC 4.62 3.87 - 5.11 MIL/uL   Hemoglobin 14.3 12.0 - 15.0 g/dL   HCT 36.642.3 44.036.0 - 34.746.0 %   MCV 91.6 78.0 - 100.0 fL   MCH 31.0 26.0 - 34.0 pg   MCHC 33.8 30.0 -  36.0 g/dL   RDW 42.514.0 95.611.5 - 38.715.5 %   Platelets 222 150 - 400 K/uL   Neutrophils Relative % 64 %   Neutro Abs 4.0 1.7 - 7.7 K/uL   Lymphocytes Relative 24 %   Lymphs Abs 1.5 0.7 - 4.0 K/uL   Monocytes Relative 8 %   Monocytes Absolute 0.5 0.1 - 1.0 K/uL   Eosinophils Relative 3 %   Eosinophils Absolute 0.2 0.0 - 0.7 K/uL   Basophils Relative 1 %   Basophils Absolute 0.0 0.0 - 0.1 K/uL  TSH  Result Value Ref Range   TSH 1.244 0.350 - 4.500 uIU/mL  T4, free  Result Value Ref Range   Free T4 0.84 0.61 - 1.12 ng/dL  Magnesium  Result Value Ref Range   Magnesium 2.0 1.7 - 2.4 mg/dL  Phosphorus  Result Value Ref Range   Phosphorus 1.8 (L) 2.5 - 4.6 mg/dL  Troponin I  Result Value Ref Range   Troponin I <0.03 <0.03 ng/mL  Hemoglobin A1c  Result Value Ref Range   Hgb A1c MFr Bld 5.8 (H) 4.8 - 5.6 %   Mean Plasma Glucose 119.76 mg/dL  Basic metabolic panel  Result Value Ref Range   Sodium 138 135 - 145 mmol/L   Potassium 3.7 3.5 - 5.1 mmol/L   Chloride 109 101 - 111 mmol/L   CO2 19 (L) 22 - 32 mmol/L   Glucose, Bld 145 (H) 65 - 99 mg/dL   BUN 10 6 - 20 mg/dL   Creatinine, Ser 5.641.18 (H) 0.44 - 1.00 mg/dL   Calcium 9.2 8.9 - 33.210.3 mg/dL   GFR calc non Af Amer 51 (L) >60 mL/min   GFR calc Af Amer 59 (L) >60 mL/min   Anion gap 10 5 - 15  Glucose, capillary  Result Value Ref Range   Glucose-Capillary 142 (H) 65 - 99 mg/dL  Glucose, capillary  Result Value Ref Range   Glucose-Capillary 99 65 - 99 mg/dL  ECHOCARDIOGRAM COMPLETE  Result Value Ref Range   Weight 3,248 oz   Height 65 in   BP 125/75 mmHg    Labs Reviewed - No data to display  No results found.     ASSESSMENT & PLAN:  1. Acute pain of left knee   2. Sciatica, left side     Meds ordered this encounter  Medications  . predniSONE (DELTASONE) 20 MG tablet    Sig: Two  daily with food    Dispense:  10 tablet    Refill:  0    Reviewed expectations re: course of current medical issues. Questions  answered. Outlined signs and symptoms indicating need for more acute intervention. Patient verbalized understanding. After Visit Summary given.    Procedures:      Elvina Sidle, MD 09/06/17 1717

## 2017-11-26 ENCOUNTER — Other Ambulatory Visit: Payer: Self-pay | Admitting: Student in an Organized Health Care Education/Training Program

## 2017-11-28 ENCOUNTER — Telehealth: Payer: Self-pay | Admitting: *Deleted

## 2017-11-28 ENCOUNTER — Ambulatory Visit: Payer: Medicaid Other | Admitting: Neurology

## 2017-11-28 NOTE — Telephone Encounter (Signed)
Tried to contact pt to inform her of below and VM has not been set up so unable to LVM.  If pt calls back please inform her of below and assist in scheduling an appointment if she wants to discuss it with doctor. Lamonte SakaiZimmerman Rumple, Khayden Herzberg D, [place]

## 2017-11-28 NOTE — Telephone Encounter (Signed)
I am covering Dr. Latanya MaudlinFeng's inbox. On chart review, it appears this patient was previously on propranolol but hospitalized with syncope 1/19 and propranolol was stopped on discharge. Seems like patient may have missed her cardiology appt on 2/4. Will not refill propranolol, needs appt prior to this. You are welcome to call and have her come back to see Dr. Mosetta PuttFeng as she is able to recheck.

## 2017-11-28 NOTE — Telephone Encounter (Signed)
Pt no showed f/u appt on 11/28/2017 @ 3:00.

## 2017-11-29 ENCOUNTER — Encounter: Payer: Self-pay | Admitting: Neurology

## 2017-12-28 ENCOUNTER — Ambulatory Visit (HOSPITAL_COMMUNITY)
Admission: EM | Admit: 2017-12-28 | Discharge: 2017-12-28 | Disposition: A | Discharge status: Home / Self Care | Payer: Medicaid Other | Attending: Family Medicine | Admitting: Family Medicine

## 2017-12-28 ENCOUNTER — Other Ambulatory Visit: Payer: Self-pay | Admitting: Internal Medicine

## 2017-12-28 ENCOUNTER — Encounter (HOSPITAL_COMMUNITY): Payer: Self-pay | Admitting: Emergency Medicine

## 2017-12-28 DIAGNOSIS — G43709 Chronic migraine without aura, not intractable, without status migrainosus: Secondary | ICD-10-CM | POA: Diagnosis not present

## 2017-12-28 DIAGNOSIS — G8929 Other chronic pain: Secondary | ICD-10-CM

## 2017-12-28 DIAGNOSIS — R51 Headache: Secondary | ICD-10-CM

## 2017-12-28 DIAGNOSIS — R112 Nausea with vomiting, unspecified: Secondary | ICD-10-CM

## 2017-12-28 DIAGNOSIS — R197 Diarrhea, unspecified: Secondary | ICD-10-CM | POA: Diagnosis not present

## 2017-12-28 DIAGNOSIS — Z1231 Encounter for screening mammogram for malignant neoplasm of breast: Secondary | ICD-10-CM

## 2017-12-28 MED ORDER — METOCLOPRAMIDE HCL 5 MG/ML IJ SOLN: 5.0000 mg | Freq: Once | INTRAMUSCULAR | Status: DC

## 2017-12-28 MED ORDER — KETOROLAC TROMETHAMINE 30 MG/ML IJ SOLN
30.0000 mg | Freq: Once | INTRAMUSCULAR | Status: AC
  Administered 2017-12-28: 30 mg via INTRAVENOUS

## 2017-12-28 MED ORDER — SUMATRIPTAN SUCCINATE 6 MG/0.5ML ~~LOC~~ SOLN
SUBCUTANEOUS | Status: AC
  Filled 2017-12-28: qty 0.5

## 2017-12-28 MED ORDER — KETOROLAC TROMETHAMINE 60 MG/2ML IM SOLN: 60.0000 mg | Freq: Once | INTRAMUSCULAR | Status: DC

## 2017-12-28 MED ORDER — SODIUM CHLORIDE 0.9 % IV BOLUS
1000.0000 mL | Freq: Once | INTRAVENOUS | Status: AC
  Administered 2017-12-28: 1000 mL via INTRAVENOUS

## 2017-12-28 MED ORDER — ONDANSETRON HCL 4 MG PO TABS: 4.0000 mg | ORAL_TABLET | Freq: Four times a day (QID) | ORAL | 0 refills | Status: DC

## 2017-12-28 MED ORDER — ONDANSETRON HCL 4 MG/2ML IJ SOLN
INTRAMUSCULAR | Status: AC
  Filled 2017-12-28: qty 2

## 2017-12-28 MED ORDER — METOCLOPRAMIDE HCL 5 MG/ML IJ SOLN
5.0000 mg | Freq: Once | INTRAMUSCULAR | Status: AC
  Administered 2017-12-28: 5 mg via INTRAVENOUS

## 2017-12-28 MED ORDER — KETOROLAC TROMETHAMINE 60 MG/2ML IM SOLN
INTRAMUSCULAR | Status: AC
  Filled 2017-12-28: qty 2

## 2017-12-28 MED ORDER — DEXAMETHASONE SODIUM PHOSPHATE 10 MG/ML IJ SOLN
10.0000 mg | Freq: Once | INTRAMUSCULAR | Status: AC
Start: 2017-12-28 — End: 2017-12-28
  Administered 2017-12-28: 10 mg via INTRAVENOUS

## 2017-12-28 MED ORDER — SUMATRIPTAN SUCCINATE 6 MG/0.5ML ~~LOC~~ SOLN
6.0000 mg | Freq: Once | SUBCUTANEOUS | Status: AC
Start: 2017-12-28 — End: 2017-12-28
  Administered 2017-12-28: 6 mg via SUBCUTANEOUS

## 2017-12-28 MED ORDER — DEXAMETHASONE SODIUM PHOSPHATE 10 MG/ML IJ SOLN
INTRAMUSCULAR | Status: AC
  Filled 2017-12-28: qty 1

## 2017-12-28 MED ORDER — ONDANSETRON HCL 4 MG/2ML IJ SOLN: 4.0000 mg | Freq: Once | INTRAMUSCULAR | Status: DC

## 2017-12-28 MED ORDER — METOCLOPRAMIDE HCL 5 MG/ML IJ SOLN
INTRAMUSCULAR | Status: AC
  Filled 2017-12-28: qty 2

## 2017-12-28 MED ORDER — ONDANSETRON HCL 4 MG/2ML IJ SOLN
4.0000 mg | Freq: Once | INTRAMUSCULAR | Status: AC
  Administered 2017-12-28: 4 mg via INTRAVENOUS

## 2017-12-28 MED ORDER — DEXAMETHASONE SODIUM PHOSPHATE 10 MG/ML IJ SOLN: 10.0000 mg | Freq: Once | INTRAMUSCULAR | Status: DC

## 2017-12-28 NOTE — Discharge Instructions (Addendum)
Unable to get chem-8 due Zofran given in office IV fluids given Headache cocktail given in office  Get rest and drink fluids Zofran prescribed.  Take as directed.   DIET Instructions: 30 minutes after taking nausea medicine, begin with sips of clear liquids. If able to hold down 2 - 4 ounces for 30 minutes, begin drinking more. Increase your fluid intake to replace losses. Clear liquids only for 24 hours (water, tea, sport drinks, clear flat ginger ale or cola and juices, broth, jello, popsicles, ect). Advance to bland foods, applesauce, rice, baked or boiled chicken, ect. Avoid milk, greasy foods and anything that doesn?t agree with you.  If you experience new or worsening symptoms return or go to ER If vomiting persists and you are unable to hold fluids or diarrhea persists more than 4 days, or becomes bloody, or if you develop high fever or abdominal pain, return here, see your doctor or go to the ER.

## 2017-12-28 NOTE — ED Provider Notes (Signed)
[facility]   [id number] 12/28/17 Arrival Time: 1622  SUBJECTIVE:  Kirsten Walsh is a 57 y.o. female who presents with complaint of >10+ episodes of vomiting and >10+ episodes of diarrhea that began abruptly this morning.  Denies eating unusual foods, recent travel, or close contacts with similar symptoms.  However, patient is currently taking PCN for tooth abscess that she started a couple of days ago.  Has not tried OTC medications.  Denies alleviating factors.  Worse with eating.  Denies similar symptoms in the past.  Last BM today and with diarrhea.    Patient complains of chronic headaches which she takes daily medication for.  Due to N/V this morning was unable to keep migraine medications down.  Localizes pain to right frontal aspect of skull.  Describes as constant.  Reports improvement in the past with HA cocktail.  Request a HA cocktail.   Complains of associated photophobia.  Denies neurological symptoms, weakness, numbness or tingling.    Denies fever, chills, appetite changes, weight changes, chest pain, SOB, constipation, hematochezia, melena, dysuria, difficulty urinating, increased frequency or urgency, flank pain, loss of bowel or bladder function.    No LMP recorded. (Menstrual status: IUD).  ROS: As per HPI.  Past Medical History:  Diagnosis Date  . Chest pain   . Chronic lower back pain    "right side to mid back" (07/06/2017)  . Family history of adverse reaction to anesthesia    "daughter did; not sure happened"  . Gall stones   . Hypertension   . Kidney stone ~ 2006  . Migraine    "qd" (07/06/2017)  . Syncope and collapse 07/06/2017    sitting on the commode; developed nausea and passed out; hit head on shower and suffered a laceration; woke up in "a pool of blood"   Past Surgical History:  Procedure Laterality Date  . CYSTOSCOPY W/ STONE MANIPULATION  ~ 2006  . INTRAUTERINE DEVICE INSERTION     "initially put in in 04/2002; changed prn"  (02/14/2013)  . LAPAROSCOPIC CHOLECYSTECTOMY  ~ 2004   Allergies  Allergen Reactions  . Apple Anaphylaxis  . Aspirin Anaphylaxis  . Carrot [Daucus Carota] Anaphylaxis  . Haloperidol And Related Other (See Comments)    Lock jaw and slurred speech   No current facility-administered medications on file prior to encounter.    Current Outpatient Medications on File Prior to Encounter  Medication Sig Dispense Refill  . butalbital-acetaminophen-caffeine (FIORICET, ESGIC) 50-325-40 MG tablet Take 1 tablet by mouth every 6 (six) hours as needed for headache or migraine.    . chlorproMAZINE (THORAZINE) 10 MG tablet Take 1 tablet (10 mg total) by mouth 3 (three) times daily. 10 tablet 0  . cloNIDine (CATAPRES) 0.1 MG tablet Take 1 tablet (0.1 mg total) by mouth 2 (two) times daily. 60 tablet 11  . cloNIDine (CATAPRES) 0.1 MG tablet Take 0.5 tablets (0.05 mg total) by mouth 2 (two) times daily for 2 days. 4 tablet 0  . cloNIDine (CATAPRES) 0.1 MG tablet Take 0.5 tablets (0.05 mg total) by mouth daily. 2 tablet 0  . EPIPEN 2-PAK 0.3 MG/0.3ML SOAJ injection INJECT 1 SYRINGE IN THE MUSCLE ONCE (Patient taking differently: INJECT 1 SYRINGE IN THE MUSCLE AS NEEDED FOR ALLERGIC REACTION) 2 Device 0  . Fremanezumab-vfrm (AJOVY) 225 MG/1.5ML SOSY Inject 1 Syringe into the skin every 30 (thirty) days. 3 Syringe 0  . hydrochlorothiazide (HYDRODIURIL) 25 MG tablet Take 1 tablet (25 mg total) by mouth daily. 60  tablet 5  . predniSONE (DELTASONE) 20 MG tablet Two daily with food 10 tablet 0  . SUMAtriptan (IMITREX) 100 MG tablet Take 1 tablet (100 mg total) by mouth once. May repeat in 2 hours if headache persists or recurs. 10 tablet 1  . topiramate (TOPAMAX) 100 MG tablet TAKE 1 AND 1/2 TABLETS(150 MG) BY MOUTH TWICE DAILY 90 tablet 3   Social History   Socioeconomic History  . Marital status: Single    Spouse name: Not on file  . Number of children: 4  . Years of education: Not on file  . Highest  education level: Associate degree: academic program  Occupational History  . Not on file  Social Needs  . Financial resource strain: Not on file  . Food insecurity:    Worry: Not on file    Inability: Not on file  . Transportation needs:    Medical: Not on file    Non-medical: Not on file  Tobacco Use  . Smoking status: Never Smoker  . Smokeless tobacco: Never Used  Substance and Sexual Activity  . Alcohol use: Yes    Comment: 07/06/2017 "couple mixed drinks/month"  . Drug use: No  . Sexual activity: Yes    Birth control/protection: IUD  Lifestyle  . Physical activity:    Days per week: Not on file    Minutes per session: Not on file  . Stress: Not on file  Relationships  . Social connections:    Talks on phone: Not on file    Gets together: Not on file    Attends religious service: Not on file    Active member of club or organization: Not on file    Attends meetings of clubs or organizations: Not on file    Relationship status: Not on file  . Intimate partner violence:    Fear of current or ex partner: Not on file    Emotionally abused: Not on file    Physically abused: Not on file    Forced sexual activity: Not on file  Other Topics Concern  . Not on file  Social History Narrative   Lives at home with a child   Right handed   Drinks occasional caffeine   Family History  Problem Relation Age of Onset  . Cancer Mother   . Pancreatic cancer Mother   . Cirrhosis Father   . Lupus Sister   . Diabetes Maternal Grandmother      OBJECTIVE:  Vitals:   12/28/17 1654  BP: 116/87  Pulse: (!) 110  Resp: 18  Temp: 97.7 F (36.5 C)  TempSrc: Oral  SpO2: 100%    General appearance: Alert; appears fatigued HEENT: NCAT.  PERRL.  Mildly tender to right frontal aspect of skull.  Oropharynx clear.  Lungs: clear to auscultation bilaterally without adventitious breath sounds Heart: regular rate and rhythm.  Radial pulses 2+ symmetrical bilaterally Abdomen: soft,  non-distended; hypoactive bowel sounds; diffusely tender about the abdomen; nontender at McBurney's point; negative rebound; no guarding Back: no CVA tenderness Extremities: no edema; symmetrical with no gross deformities Skin: warm and dry Neurologic: normal gait; CN 2-12 grossly intact; strength and sensation intact about the upper and lower extremities.   Psychological: alert and cooperative; normal mood and affect  Labs: Unable to obtain chem-8  ASSESSMENT & PLAN:  1. Nausea vomiting and diarrhea   2. Chronic nonintractable headache, unspecified headache type     Meds ordered this encounter  Medications  . ondansetron (ZOFRAN) injection 4 mg  .  sodium chloride 0.9 % bolus 1,000 mL  . ondansetron (ZOFRAN) injection 4 mg  . DISCONTD: ketorolac (TORADOL) injection 60 mg  . DISCONTD: metoCLOPramide (REGLAN) injection 5 mg  . DISCONTD: dexamethasone (DECADRON) injection 10 mg  . SUMAtriptan (IMITREX) injection 6 mg  . ondansetron (ZOFRAN) 4 MG tablet    Sig: Take 1 tablet (4 mg total) by mouth every 6 (six) hours.    Dispense:  12 tablet    Refill:  0    Order Specific Question:   Supervising Provider    Answer:   Isa Rankin [phone]  . ketorolac (TORADOL) 30 MG/ML injection 30 mg  . metoCLOPramide (REGLAN) injection 5 mg  . dexamethasone (DECADRON) injection 10 mg   Unable to get chem-8 due Zofran given in office IV fluids given Headache cocktail given in office  Get rest and drink fluids Zofran prescribed.  Take as directed.   DIET Instructions: 30 minutes after taking nausea medicine, begin with sips of clear liquids. If able to hold down 2 - 4 ounces for 30 minutes, begin drinking more. Increase your fluid intake to replace losses. Clear liquids only for 24 hours (water, tea, sport drinks, clear flat ginger ale or cola and juices, broth, jello, popsicles, ect). Advance to bland foods, applesauce, rice, baked or boiled chicken, ect. Avoid milk, greasy foods  and anything that doesn't agree with you.  If you experience new or worsening symptoms return or go to ER If vomiting persists and you are unable to hold fluids or diarrhea persists more than 4 days, or becomes bloody, or if you develop high fever or abdominal pain, return here, see your doctor or go to the ER.   Reviewed expectations re: course of current medical issues. Questions answered. Outlined signs and symptoms indicating need for more acute intervention. Patient verbalized understanding. After Visit Summary given.   Rennis Harding, PA-C 12/28/17 2108

## 2017-12-28 NOTE — ED Triage Notes (Signed)
Pt sts vomiting and HA; pt sts hx of chronic daily migraines

## 2018-01-17 ENCOUNTER — Other Ambulatory Visit: Payer: Self-pay | Admitting: Student in an Organized Health Care Education/Training Program

## 2018-01-17 ENCOUNTER — Ambulatory Visit
Admission: RE | Admit: 2018-01-17 | Discharge: 2018-01-17 | Disposition: A | Discharge status: Home / Self Care | Payer: Medicaid Other | Source: Ambulatory Visit | Attending: Internal Medicine | Admitting: Internal Medicine

## 2018-01-17 DIAGNOSIS — Z1231 Encounter for screening mammogram for malignant neoplasm of breast: Secondary | ICD-10-CM

## 2018-01-17 DIAGNOSIS — IMO0002 Reserved for concepts with insufficient information to code with codable children: Secondary | ICD-10-CM

## 2018-01-17 DIAGNOSIS — G43709 Chronic migraine without aura, not intractable, without status migrainosus: Secondary | ICD-10-CM

## 2018-03-17 ENCOUNTER — Other Ambulatory Visit: Payer: Self-pay | Admitting: Student in an Organized Health Care Education/Training Program

## 2018-03-17 DIAGNOSIS — G43709 Chronic migraine without aura, not intractable, without status migrainosus: Secondary | ICD-10-CM

## 2018-03-17 DIAGNOSIS — IMO0002 Reserved for concepts with insufficient information to code with codable children: Secondary | ICD-10-CM

## 2018-04-05 ENCOUNTER — Other Ambulatory Visit: Payer: Self-pay | Admitting: Student in an Organized Health Care Education/Training Program

## 2018-04-05 DIAGNOSIS — IMO0002 Reserved for concepts with insufficient information to code with codable children: Secondary | ICD-10-CM

## 2018-04-05 DIAGNOSIS — G43709 Chronic migraine without aura, not intractable, without status migrainosus: Secondary | ICD-10-CM

## 2018-04-05 DIAGNOSIS — I10 Essential (primary) hypertension: Secondary | ICD-10-CM

## 2018-04-10 ENCOUNTER — Emergency Department (HOSPITAL_COMMUNITY)
Admission: EM | Admit: 2018-04-10 | Discharge: 2018-04-10 | Disposition: A | Discharge status: Home / Self Care | Payer: Medicaid Other | Attending: Emergency Medicine | Admitting: Emergency Medicine

## 2018-04-10 ENCOUNTER — Encounter (HOSPITAL_COMMUNITY): Payer: Self-pay | Admitting: Pharmacy Technician

## 2018-04-10 ENCOUNTER — Emergency Department (HOSPITAL_COMMUNITY): Payer: Medicaid Other

## 2018-04-10 ENCOUNTER — Other Ambulatory Visit: Payer: Self-pay

## 2018-04-10 DIAGNOSIS — R569 Unspecified convulsions: Secondary | ICD-10-CM | POA: Insufficient documentation

## 2018-04-10 DIAGNOSIS — I10 Essential (primary) hypertension: Secondary | ICD-10-CM | POA: Diagnosis not present

## 2018-04-10 DIAGNOSIS — Z79899 Other long term (current) drug therapy: Secondary | ICD-10-CM | POA: Insufficient documentation

## 2018-04-10 DIAGNOSIS — G43819 Other migraine, intractable, without status migrainosus: Secondary | ICD-10-CM | POA: Diagnosis not present

## 2018-04-10 LAB — BASIC METABOLIC PANEL
Anion gap: 9 (ref 5–15)
BUN: 10 mg/dL (ref 6–20)
CO2: 19 mmol/L — AB (ref 22–32)
CREATININE: 1.05 mg/dL — AB (ref 0.44–1.00)
Calcium: 8.8 mg/dL — ABNORMAL LOW (ref 8.9–10.3)
Chloride: 113 mmol/L — ABNORMAL HIGH (ref 98–111)
GFR calc Af Amer: >60 mL/min (ref >60)
GFR calc non Af Amer: 59 mL/min — ABNORMAL LOW (ref >60)
Glucose, Bld: 93 mg/dL (ref 70–99)
Potassium: 3.6 mmol/L (ref 3.5–5.1)
SODIUM: 141 mmol/L (ref 135–145)

## 2018-04-10 LAB — CBC
HEMATOCRIT: 39.6 % (ref 36.0–46.0)
Hemoglobin: 12.9 g/dL (ref 12.0–15.0)
MCH: 30.1 pg (ref 26.0–34.0)
MCHC: 32.6 g/dL (ref 30.0–36.0)
MCV: 92.3 fL (ref 80.0–100.0)
Platelets: 219 10*3/uL (ref 150–400)
RBC: 4.29 MIL/uL (ref 3.87–5.11)
RDW: 13.4 % (ref 11.5–15.5)
WBC: 4.2 10*3/uL (ref 4.0–10.5)
nRBC: 0 % (ref 0.0–0.2)

## 2018-04-10 LAB — I-STAT BETA HCG BLOOD, ED (MC, WL, AP ONLY): I-stat hCG, quantitative: <5 m[IU]/mL (ref <5)

## 2018-04-10 MED ORDER — HYDROMORPHONE HCL 1 MG/ML IJ SOLN
1.0000 mg | Freq: Once | INTRAMUSCULAR | Status: AC
  Administered 2018-04-10: 1 mg via INTRAVENOUS
  Filled 2018-04-10: qty 1

## 2018-04-10 MED ORDER — KETOROLAC TROMETHAMINE 30 MG/ML IJ SOLN
30.0000 mg | Freq: Once | INTRAMUSCULAR | Status: AC
  Administered 2018-04-10: 30 mg via INTRAVENOUS
  Filled 2018-04-10: qty 1

## 2018-04-10 MED ORDER — OXYCODONE-ACETAMINOPHEN 5-325 MG PO TABS
1.0000 | ORAL_TABLET | Freq: Once | ORAL | Status: AC
Start: 2018-04-10 — End: 2018-04-10
  Administered 2018-04-10: 1 via ORAL
  Filled 2018-04-10: qty 1

## 2018-04-10 MED ORDER — SODIUM CHLORIDE 0.9 % IV BOLUS
1000.0000 mL | Freq: Once | INTRAVENOUS | Status: AC
  Administered 2018-04-10: 1000 mL via INTRAVENOUS

## 2018-04-10 MED ORDER — PROCHLORPERAZINE EDISYLATE 10 MG/2ML IJ SOLN
10.0000 mg | Freq: Once | INTRAMUSCULAR | Status: AC
  Administered 2018-04-10: 10 mg via INTRAVENOUS
  Filled 2018-04-10: qty 2

## 2018-04-10 MED ORDER — DEXAMETHASONE SODIUM PHOSPHATE 10 MG/ML IJ SOLN
10.0000 mg | Freq: Once | INTRAMUSCULAR | Status: AC
  Administered 2018-04-10: 10 mg via INTRAVENOUS
  Filled 2018-04-10: qty 1

## 2018-04-10 NOTE — Discharge Instructions (Signed)
Please call your neurologist for follow up °

## 2018-04-10 NOTE — ED Triage Notes (Signed)
Pt arrives via EMS with migraine X1 week. Pt with witnessed fall to L banging into the wall with seizure like activity with no previous sz history. Pt warm to the touch. Temporal 100.5 with EMS. Pt given 4mg  zofran IV en route and 713mg  tylenol given. cbg 160, 130/80, HR 100. 24g LFA.

## 2018-04-10 NOTE — ED Notes (Signed)
I tried to draw patient blood in 2 different areas and I didn't have any success, the Nurse was informed.

## 2018-04-10 NOTE — ED Notes (Signed)
ED Provider at bedside. 

## 2018-04-10 NOTE — ED Provider Notes (Signed)
[facility] EMERGENCY DEPARTMENT Provider Note   CSN: [id number] Arrival date & time: 04/10/18  1235     History   Chief Complaint Chief Complaint  Patient presents with  . Seizures    HPI Kirsten Walsh is a 57 y.o. female.  HPI Patient is a 57 year old female with a history of chronic migraines, complex regional pain syndrome, chronic low back pain who presents the emergency department with worsening headache over the past week followed by an episode where she became slightly lightheaded with worsening headache and had a witnessed seizure per the patient.  Reported seizure-like activity after falling and hitting a wall.  Patient given 4 mg IV Zofran in route.  Tylenol given.  Blood sugar was 160.  Patient reports ongoing severe headache at this time.  No use of anticoagulants.  Denies neck pain.  Denies weakness of her arms or legs.   Past Medical History:  Diagnosis Date  . Chest pain   . Chronic lower back pain    "right side to mid back" (07/06/2017)  . Family history of adverse reaction to anesthesia    "daughter did; not sure happened"  . Gall stones   . Hypertension   . Kidney stone ~ 2006  . Migraine    "qd" (07/06/2017)  . Syncope and collapse 07/06/2017    sitting on the commode; developed nausea and passed out; hit head on shower and suffered a laceration; woke up in "a pool of blood"    Patient Active Problem List   Diagnosis Date Noted  . Scalp laceration, subsequent encounter 07/20/2017  . Acute kidney injury (nontraumatic) (HCC)   . Diuretic-induced hypokalemia   . Scalp laceration, initial encounter   . Syncope 07/06/2017  . Healthcare maintenance 08/09/2016  . Chronic daily headache 09/02/2015  . 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 Syncopal episodes 09/02/2015  . Right arm weakness 08/05/2015  . Edema of right arm and right leg 09/07/2014  . IUD contraception 07/18/2014  . History of anaphylaxis 06/26/2014  . Intractable migraine  with status migrainosus 06/10/2014  . Chronic migraine without aura, intractable, with status migrainosus   . Bradycardia 02/18/2013  . Cardiomyopathy 10/20/2011  . Decreased ejection fraction 10/05/2011  . Leg swelling 09/23/2011  . Complex regional pain syndrome 05/03/2011  . Chronic pain 03/29/2011  . NECK PAIN, RIGHT 02/03/2010  . ALLERGIC RHINITIS 01/30/2009  . DIZZINESS 07/16/2008  . Chronic migraine 04/26/2007  . OBESITY, NOS 08/18/2006  . HYPERTENSION, BENIGN SYSTEMIC 08/18/2006    Past Surgical History:  Procedure Laterality Date  . CYSTOSCOPY W/ STONE MANIPULATION  ~ 2006  . INTRAUTERINE DEVICE INSERTION     "initially put in in 04/2002; changed prn" (02/14/2013)  . LAPAROSCOPIC CHOLECYSTECTOMY  ~ 2004     OB History   None      Home Medications    Prior to Admission medications   Medication Sig Start Date End Date Taking? Authorizing Provider  butalbital-acetaminophen-caffeine (FIORICET, ESGIC) 50-325-40 MG tablet Take 1 tablet by mouth every 6 (six) hours as needed for headache or migraine.   Yes [provider]  cloNIDine (CATAPRES) 0.1 MG tablet Take 1 tablet (0.1 mg total) by mouth 2 (two) times daily. 03/23/17  Yes Howard Pouch, MD  cloNIDine (CATAPRES) 0.1 MG tablet Take 0.5 tablets (0.05 mg total) by mouth daily. 07/09/17  Yes Myrene Buddy, MD  EPIPEN 2-PAK 0.3 MG/0.3ML SOAJ injection INJECT 1 SYRINGE IN THE MUSCLE ONCE Patient taking differently: Inject 0.3 mg into  the muscle once. For allergic reaction 02/03/15  Yes Ardith Dark, MD  Fremanezumab-vfrm (AJOVY) 225 MG/1.5ML SOSY Inject 1 Syringe into the skin every 30 (thirty) days. 06/27/17  Yes Anson Fret, MD  hydrochlorothiazide (HYDRODIURIL) 25 MG tablet TAKE 1 TABLET(25 MG) BY MOUTH DAILY Patient taking differently: Take 25 mg by mouth daily.  04/05/18  Yes Howard Pouch, MD  ondansetron (ZOFRAN) 4 MG tablet Take 1 tablet (4 mg total) by mouth every 6 (six) hours. Patient taking  differently: Take 4 mg by mouth as needed for nausea or vomiting.  12/28/17  Yes [place], [place], PA-C  oxyCODONE (OXY IR/ROXICODONE) 5 MG immediate release tablet Take 5 mg by mouth 3 (three) times daily. 03/16/18  Yes [provider]  SUMAtriptan (IMITREX) 100 MG tablet Take 1 tablet (100 mg total) by mouth once. May repeat in 2 hours if headache persists or recurs. 01/31/17 07/06/22 Yes Almon Hercules, MD  topiramate (TOPAMAX) 100 MG tablet TAKE 1 AND 1/2 TABLETS(150 MG) BY MOUTH TWICE DAILY Patient taking differently: Take 150 mg by mouth 2 (two) times daily.  04/05/18  Yes Howard Pouch, MD  chlorproMAZINE (THORAZINE) 10 MG tablet Take 1 tablet (10 mg total) by mouth 3 (three) times daily. Patient not taking: Reported on 04/10/2018 01/31/17   Almon Hercules, MD  cloNIDine (CATAPRES) 0.1 MG tablet Take 0.5 tablets (0.05 mg total) by mouth 2 (two) times daily for 2 days. Patient not taking: Reported on 04/10/2018 07/08/17 04/10/18  Myrene Buddy, MD    Family History Family History  Problem Relation Age of Onset  . Cancer Mother   . Pancreatic cancer Mother   . Cirrhosis Father   . Lupus Sister   . Diabetes Maternal Grandmother     Social History Social History   Tobacco Use  . Smoking status: Never Smoker  . Smokeless tobacco: Never Used  Substance Use Topics  . Alcohol use: Yes    Comment: 07/06/2017 "couple mixed drinks/month"  . Drug use: No     Allergies   Apple; Aspirin; Carrot [daucus carota]; and Haloperidol and related   Review of Systems Review of Systems  All other systems reviewed and are negative.    Physical Exam Updated Vital Signs BP 138/79   Pulse 83   Temp 98.6 F (37 C) (Oral)   Resp 17   SpO2 100%   Physical Exam  Constitutional: She is oriented to person, place, and time. She appears well-developed and well-nourished. No distress.  HENT:  Head: Normocephalic and atraumatic.  Eyes: Pupils are equal, round, and reactive to light. EOM are  normal.  Neck: Normal range of motion.  Cardiovascular: Normal rate, regular rhythm and normal heart sounds.  Pulmonary/Chest: Effort normal and breath sounds normal.  Abdominal: Soft. She exhibits no distension. There is no tenderness.  Musculoskeletal: Normal range of motion.  Neurological: She is alert and oriented to person, place, and time.  5/5 strength in major muscle groups of  bilateral upper and lower extremities. Speech normal. No facial asymetry.   Skin: Skin is warm and dry.  Psychiatric: She has a normal mood and affect. Judgment normal.  Nursing note and vitals reviewed.    ED Treatments / Results  Labs (all labs ordered are listed, but only abnormal results are displayed) Labs Reviewed  BASIC METABOLIC PANEL - Abnormal; Notable for the following components:      Result Value   Chloride 113 (*)    CO2 19 (*)  Creatinine, Ser 1.05 (*)    Calcium 8.8 (*)    GFR calc non Af Amer 59 (*)    All other components within normal limits  CBC  I-STAT BETA HCG BLOOD, ED ([facility], [facility], [facility] ONLY)    EKG EKG Interpretation  Date/Time:  Monday April 10 2018 12:38:35 EDT Ventricular Rate:  88 PR Interval:    QRS Duration: 86 QT Interval:  376 QTC Calculation: 455 R Axis:   20 Text Interpretation:  Sinus rhythm No significant change was found Confirmed by Azalia Bilis ([id number]) on 04/10/2018 4:45:01 PM   Radiology Ct Head Wo Contrast  Result Date: 04/10/2018 CLINICAL DATA:  Migraine headache for 1 week, witnessed fall with seizure like activity EXAM: CT HEAD WITHOUT CONTRAST TECHNIQUE: Contiguous axial images were obtained from the base of the skull through the vertex without intravenous contrast. COMPARISON:  07/06/2017 FINDINGS: Brain: The brainstem, cerebellum, cerebral peduncles, thalami, basal ganglia, basilar cisterns, and ventricular system appear within normal limits. No intracranial hemorrhage, mass lesion, or acute CVA. Incidental cavum septi pellucidi et vergae.  Vascular: Unremarkable Skull: Unremarkable Sinuses/Orbits: Unremarkable Other: Faint speckled scalp calcifications. IMPRESSION: 1. No significant intracranial abnormality to account for the patient's current symptoms. 2. Faint chronic benign speckled scalp calcifications. Electronically Signed   By: Gaylyn Rong M.D.   On: 04/10/2018 14:09    Procedures Procedures (including critical care time)  Medications Ordered in ED Medications  dexamethasone (DECADRON) injection 10 mg (has no administration in time range)  ketorolac (TORADOL) 30 MG/ML injection 30 mg (has no administration in time range)  oxyCODONE-acetaminophen (PERCOCET/ROXICET) 5-325 MG per tablet 1 tablet (has no administration in time range)  HYDROmorphone (DILAUDID) injection 1 mg (1 mg Intravenous Given 04/10/18 1405)  prochlorperazine (COMPAZINE) injection 10 mg (10 mg Intravenous Given 04/10/18 1405)  sodium chloride 0.9 % bolus 1,000 mL (0 mLs Intravenous Stopped 04/10/18 1643)     Initial Impression / Assessment and Plan / ED Course  I have reviewed the triage vital signs and the nursing notes.  Pertinent labs & imaging results that were available during my care of the patient were reviewed by me and considered in my medical decision making (see chart for details).     CT imaging without acute abnormality here in the emergency department.  Migraine improving but not resolved.  Patient is safe to be discharged from the emergency department to follow-up with her neurologist.  She has pain medication at home.  She has a long-standing relationship with a neurologist who understands her migraines.  Her migraines have been very difficult to control.  In regards to the possible seizure this is unclear.  She has been given standard seizure precautions.  Her neurologist can further evaluate this as an outpatient.  No tongue biting or urinary incontinence.  Final Clinical Impressions(s) / ED Diagnoses   Final diagnoses:    None    ED Discharge Orders    None       Azalia Bilis, MD 04/10/18 1657

## 2018-04-10 NOTE — ED Notes (Signed)
Attempted blood draw x 1 without success 

## 2018-04-10 NOTE — ED Notes (Signed)
Patient verbalizes understanding of discharge instructions. Opportunity for questioning and answers were provided. 

## 2018-05-17 ENCOUNTER — Other Ambulatory Visit: Payer: Self-pay | Admitting: Student in an Organized Health Care Education/Training Program

## 2018-05-17 DIAGNOSIS — I10 Essential (primary) hypertension: Secondary | ICD-10-CM

## 2018-05-22 ENCOUNTER — Other Ambulatory Visit: Payer: Self-pay

## 2018-05-22 DIAGNOSIS — I10 Essential (primary) hypertension: Secondary | ICD-10-CM

## 2018-06-22 ENCOUNTER — Other Ambulatory Visit: Payer: Self-pay | Admitting: Student in an Organized Health Care Education/Training Program

## 2018-06-22 DIAGNOSIS — I10 Essential (primary) hypertension: Secondary | ICD-10-CM

## 2018-06-22 DIAGNOSIS — G43709 Chronic migraine without aura, not intractable, without status migrainosus: Secondary | ICD-10-CM

## 2018-06-22 DIAGNOSIS — IMO0002 Reserved for concepts with insufficient information to code with codable children: Secondary | ICD-10-CM

## 2018-06-23 ENCOUNTER — Other Ambulatory Visit: Payer: Self-pay | Admitting: Student in an Organized Health Care Education/Training Program

## 2018-06-23 DIAGNOSIS — I10 Essential (primary) hypertension: Secondary | ICD-10-CM

## 2018-06-23 NOTE — Telephone Encounter (Signed)
To white team. 

## 2018-06-23 NOTE — Telephone Encounter (Signed)
Patient needs a PCP appointment before topomax refills. Appears she was started on a different migraine medication by neurology. Refilled HCTZ

## 2018-07-20 ENCOUNTER — Other Ambulatory Visit: Payer: Self-pay | Admitting: Student in an Organized Health Care Education/Training Program

## 2018-07-20 DIAGNOSIS — I10 Essential (primary) hypertension: Secondary | ICD-10-CM

## 2018-07-20 DIAGNOSIS — G43709 Chronic migraine without aura, not intractable, without status migrainosus: Secondary | ICD-10-CM

## 2018-07-20 DIAGNOSIS — IMO0002 Reserved for concepts with insufficient information to code with codable children: Secondary | ICD-10-CM

## 2018-07-24 NOTE — Telephone Encounter (Signed)
PCLM for pt to call . If pt calls, please schedule her an appt for medication refill. Sunday Spillers, CMA

## 2018-07-24 NOTE — Telephone Encounter (Signed)
Pt returned Kirsten Walsh call. Per last note I scheduled to see PCP on 2/10 for medication followup. Pt is hoping to get enough of her migraine medication to last her until this apt date. Please advise.

## 2018-07-24 NOTE — Telephone Encounter (Signed)
Patient needs OV, has been over a year since last appt. Refilled HCTZ x1 month

## 2018-07-31 ENCOUNTER — Other Ambulatory Visit: Payer: Self-pay

## 2018-07-31 ENCOUNTER — Other Ambulatory Visit: Payer: Self-pay | Admitting: Student in an Organized Health Care Education/Training Program

## 2018-07-31 ENCOUNTER — Encounter: Payer: Self-pay | Admitting: Student in an Organized Health Care Education/Training Program

## 2018-07-31 ENCOUNTER — Ambulatory Visit: Payer: Medicaid Other | Admitting: Student in an Organized Health Care Education/Training Program

## 2018-07-31 DIAGNOSIS — G43709 Chronic migraine without aura, not intractable, without status migrainosus: Secondary | ICD-10-CM | POA: Diagnosis present

## 2018-07-31 DIAGNOSIS — IMO0002 Reserved for concepts with insufficient information to code with codable children: Secondary | ICD-10-CM

## 2018-07-31 DIAGNOSIS — R519 Headache, unspecified: Secondary | ICD-10-CM

## 2018-07-31 DIAGNOSIS — R51 Headache: Secondary | ICD-10-CM

## 2018-07-31 MED ORDER — EPINEPHRINE 0.3 MG/0.3ML IJ SOAJ: 0.3000 mg | Freq: Once | INTRAMUSCULAR | 0 refills | Status: AC

## 2018-07-31 MED ORDER — TOPIRAMATE 100 MG PO TABS: 150.0000 mg | ORAL_TABLET | Freq: Two times a day (BID) | ORAL | 0 refills | Status: DC

## 2018-07-31 NOTE — Patient Instructions (Addendum)
Thank you for coming in to see me in the office today.  Our clinic's number is [phone]. Please call with questions or concerns about what we discussed today.  Be well, Dr. Mosetta Putt  b

## 2018-08-01 NOTE — Progress Notes (Signed)
CC: Chronic headaches  HPI: Kirsten Walsh is a 58 y.o. female  Patient Active Problem List   Diagnosis Date Noted  . Scalp laceration, subsequent encounter 07/20/2017  . Acute kidney injury (nontraumatic) (HCC)   . Diuretic-induced hypokalemia   . Scalp laceration, initial encounter   . Syncope 07/06/2017  . Healthcare maintenance 08/09/2016  . Chronic daily headache 09/02/2015  . 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 Syncopal episodes 09/02/2015  . Right arm weakness 08/05/2015  . Edema of right arm and right leg 09/07/2014  . IUD contraception 07/18/2014  . History of anaphylaxis 06/26/2014  . Intractable migraine with status migrainosus 06/10/2014  . Chronic migraine without aura, intractable, with status migrainosus   . Bradycardia 02/18/2013  . Cardiomyopathy 10/20/2011  . Decreased ejection fraction 10/05/2011  . Leg swelling 09/23/2011  . Complex regional pain syndrome 05/03/2011  . Chronic pain 03/29/2011  . NECK PAIN, RIGHT 02/03/2010  . ALLERGIC RHINITIS 01/30/2009  . DIZZINESS 07/16/2008  . Chronic migraine 04/26/2007  . OBESITY, NOS 08/18/2006  . HYPERTENSION, BENIGN SYSTEMIC 08/18/2006    Patient has chronic headache pain for which she takes oxycodone and fiorcet from pain clinic. She takes torsemide. She has about 12 days per month in which headache pain is severe, however she works through it and does not miss work. Her headaches never completely resolve. She describes right sided temple pain radiating to her right ear and down her neck.   She has difficulty identifying triggers for headaches because sometimes the bright lights bother her, sometimes she still has pain in the dark. She has reactions to strong scents causing her to feel like she will pass out. She is uncertain whether topomax improves her symptoms or not.  Of note, patient pointed out during this visit that she feels her PCP (me) does not always believe that she has pain and she has not  appreciated me looking her up in the [place] controlled substance data base because she felt I was disrespecting her/putting her in a certain "category of person." We discussed her care at great length during this visit (45-60 minutes) and she is agreeable to continuing to come to me for care.   Review of Symptoms:  See HPI for ROS.   CC, SH/smoking status, and VS noted.  Objective: BP 122/84   Pulse 74   Temp 98.3 F (36.8 C) (Oral)   Ht 5\' 5"  (1.651 m)   Wt 201 lb 12.8 oz (91.5 kg)   SpO2 99%   BMI 33.58 kg/m  GEN: NAD, alert, cooperative, and pleasant. RESPIRATORY: Comfortable work of breathing, speaks in full sentences CV: Regular rate noted, distal extremities well perfused and warm without edema GI: Soft, nondistended SKIN: warm and dry, no rashes or lesions NEURO: II-XII grossly intact MSK: Moves 4 extremities equally PSYCH: AAOx3, appropriate affect  Assessment and plan:  Chronic daily headache Her headaches are not consistent with typical migraines. Patient is adamant about the diagnosis of migraine. Risk/benefit discussion for continuing topomax, and shared decision making: - refill topomax, but would consider trial off of this medication in the future.  - patient to call back for another appointment with neurology, if she needs a referral she will let me know and we can place one - she  Continues to follow with pain management - she was previously seen in headache clinic, treatments were out of pocket and not helpful enough to justify the cost    No orders of the defined types were placed  in this encounter.   Meds ordered this encounter  Medications  . DISCONTD: topiramate (TOPAMAX) 100 MG tablet    Sig: Take 1.5 tablets (150 mg total) by mouth 2 (two) times daily.    Dispense:  180 tablet    Refill:  0  . EPINEPHrine (EPIPEN 2-PAK) 0.3 mg/0.3 mL IJ SOAJ injection    Sig: Inject 0.3 mLs (0.3 mg total) into the muscle once for 1 dose. For allergic reaction     Dispense:  2 Device    Refill:  0     Howard Pouch, MD,MS,  PGY3 08/02/2018 2:07 PM

## 2018-08-02 NOTE — Assessment & Plan Note (Signed)
Her headaches are not consistent with typical migraines. Patient is adamant about the diagnosis of migraine. Risk/benefit discussion for continuing topomax, and shared decision making: - refill topomax, but would consider trial off of this medication in the future.  - patient to call back for another appointment with neurology, if she needs a referral she will let me know and we can place one - she  Continues to follow with pain management - she was previously seen in headache clinic, treatments were out of pocket and not helpful enough to justify the cost

## 2018-08-21 ENCOUNTER — Ambulatory Visit: Payer: Medicaid Other | Admitting: Student in an Organized Health Care Education/Training Program

## 2018-08-26 ENCOUNTER — Ambulatory Visit (HOSPITAL_COMMUNITY)
Admission: EM | Admit: 2018-08-26 | Discharge: 2018-08-26 | Disposition: A | Discharge status: Home / Self Care | Payer: Medicaid Other | Attending: Family Medicine | Admitting: Family Medicine

## 2018-08-26 ENCOUNTER — Encounter (HOSPITAL_COMMUNITY): Payer: Self-pay

## 2018-08-26 ENCOUNTER — Ambulatory Visit (INDEPENDENT_AMBULATORY_CARE_PROVIDER_SITE_OTHER): Payer: Medicaid Other

## 2018-08-26 ENCOUNTER — Other Ambulatory Visit: Payer: Self-pay | Admitting: Student in an Organized Health Care Education/Training Program

## 2018-08-26 DIAGNOSIS — M25562 Pain in left knee: Secondary | ICD-10-CM | POA: Diagnosis not present

## 2018-08-26 DIAGNOSIS — S86912A Strain of unspecified muscle(s) and tendon(s) at lower leg level, left leg, initial encounter: Secondary | ICD-10-CM

## 2018-08-26 DIAGNOSIS — I10 Essential (primary) hypertension: Secondary | ICD-10-CM

## 2018-08-26 MED ORDER — PREDNISONE 20 MG PO TABS: ORAL_TABLET | ORAL | 0 refills | Status: DC

## 2018-08-26 NOTE — ED Triage Notes (Signed)
Pt present left knee pain, pt states she made a wrong turn or twist and her knee locked up and she has not been able to move it.

## 2018-08-26 NOTE — ED Provider Notes (Signed)
[facility]    CSN: [id number] Arrival date & time: 08/26/18  1326     History   Chief Complaint Chief Complaint  Patient presents with  . Knee Pain    Left     HPI Kirsten Walsh is a 58 y.o. female.   Pt present left knee pain, pt states she made a wrong turn or twist and her knee locked up and she has not been able to move it.   She has been walking on it since the injury last night   According to the patient, she was sitting and moved suddently and awkwardly to help friend.  She had sharp pain, but thought she could just walk it out.  When she awoke this morning, the left knee was swollen and aching.  She's been walking on it, but the pain (achy) won't go away.  Note:  Patient receiving oxycodone every month for over 6 months.     Past Medical History:  Diagnosis Date  . Chest pain   . Chronic lower back pain    "right side to mid back" (07/06/2017)  . Family history of adverse reaction to anesthesia    "daughter did; not sure happened"  . Gall stones   . Hypertension   . Kidney stone ~ 2006  . Migraine    "qd" (07/06/2017)  . Syncope and collapse 07/06/2017    sitting on the commode; developed nausea and passed out; hit head on shower and suffered a laceration; woke up in "a pool of blood"    Patient Active Problem List   Diagnosis Date Noted  . Scalp laceration, subsequent encounter 07/20/2017  . Acute kidney injury (nontraumatic) (HCC)   . Diuretic-induced hypokalemia   . Scalp laceration, initial encounter   . Syncope 07/06/2017  . Healthcare maintenance 08/09/2016  . Chronic daily headache 09/02/2015  . 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 Syncopal episodes 09/02/2015  . Right arm weakness 08/05/2015  . Edema of right arm and right leg 09/07/2014  . IUD contraception 07/18/2014  . History of anaphylaxis 06/26/2014  . Intractable migraine with status migrainosus 06/10/2014  . Chronic migraine without aura, intractable, with status  migrainosus   . Bradycardia 02/18/2013  . Cardiomyopathy 10/20/2011  . Decreased ejection fraction 10/05/2011  . Leg swelling 09/23/2011  . Complex regional pain syndrome 05/03/2011  . Chronic pain 03/29/2011  . NECK PAIN, RIGHT 02/03/2010  . ALLERGIC RHINITIS 01/30/2009  . DIZZINESS 07/16/2008  . Chronic migraine 04/26/2007  . OBESITY, NOS 08/18/2006  . HYPERTENSION, BENIGN SYSTEMIC 08/18/2006    Past Surgical History:  Procedure Laterality Date  . CYSTOSCOPY W/ STONE MANIPULATION  ~ 2006  . INTRAUTERINE DEVICE INSERTION     "initially put in in 04/2002; changed prn" (02/14/2013)  . LAPAROSCOPIC CHOLECYSTECTOMY  ~ 2004    OB History   No obstetric history on file.      Home Medications    Prior to Admission medications   Medication Sig Start Date End Date Taking? Authorizing Provider  butalbital-acetaminophen-caffeine (FIORICET, ESGIC) 50-325-40 MG tablet Take 1 tablet by mouth every 6 (six) hours as needed for headache or migraine.    [provider]  Fremanezumab-vfrm (AJOVY) 225 MG/1.5ML SOSY Inject 1 Syringe into the skin every 30 (thirty) days. 06/27/17   Anson FretAhern, Antonia B, MD  hydrochlorothiazide (HYDRODIURIL) 25 MG tablet TAKE 1 TABLET(25 MG) BY MOUTH DAILY 07/24/18   Howard PouchFeng, Lauren, MD  ondansetron (ZOFRAN) 4 MG tablet Take 1 tablet (4 mg total)  by mouth every 6 (six) hours. Patient taking differently: Take 4 mg by mouth as needed for nausea or vomiting.  12/28/17   Wurst, [place], PA-C  predniSONE (DELTASONE) 20 MG tablet Two daily with food 08/26/18   Elvina Sidle, MD  SUMAtriptan (IMITREX) 100 MG tablet Take 1 tablet (100 mg total) by mouth once. May repeat in 2 hours if headache persists or recurs. 01/31/17 07/06/22  Almon Hercules, MD  topiramate (TOPAMAX) 100 MG tablet TAKE 1 AND 1/2 TABLETS(150 MG) BY MOUTH TWICE DAILY 08/01/18   Howard Pouch, MD    Family History Family History  Problem Relation Age of Onset  . Cancer Mother   . Pancreatic cancer Mother    . Cirrhosis Father   . Lupus Sister   . Diabetes Maternal Grandmother     Social History Social History   Tobacco Use  . Smoking status: Never Smoker  . Smokeless tobacco: Never Used  Substance Use Topics  . Alcohol use: Yes    Comment: 07/06/2017 "couple mixed drinks/month"  . Drug use: No     Allergies   Apple; Aspirin; Carrot [daucus carota]; and Haloperidol and related   Review of Systems Review of Systems   Physical Exam Triage Vital Signs ED Triage Vitals  Enc Vitals Group     BP 08/26/18 1406 129/83     Pulse Rate 08/26/18 1406 83     Resp 08/26/18 1406 16     Temp 08/26/18 1406 98.2 F (36.8 C)     Temp Source 08/26/18 1406 Oral     SpO2 08/26/18 1406 100 %     Weight --      Height --      Head Circumference --      Peak Flow --      Pain Score 08/26/18 1408 6     Pain Loc --      Pain Edu? --      Excl. in GC? --    No data found.  Updated Vital Signs BP 129/83 (BP Location: Right Arm)   Pulse 83   Temp 98.2 F (36.8 C) (Oral)   Resp 16   SpO2 100%    Physical Exam Vitals signs and nursing note reviewed.  Constitutional:      Appearance: Normal appearance.  HENT:     Head: Normocephalic and atraumatic.  Cardiovascular:     Pulses: Normal pulses.  Pulmonary:     Effort: Pulmonary effort is normal.  Musculoskeletal:        General: Swelling, tenderness, deformity and signs of injury present.     Comments: Left knee effusion present.  Pain with light touch to the entire knee. No ecchymosis or deformity.  Skin:    General: Skin is warm and dry.  Neurological:     General: No focal deficit present.     Mental Status: She is alert.  Psychiatric:        Mood and Affect: Mood normal.      UC Treatments / Results  Labs (all labs ordered are listed, but only abnormal results are displayed) Labs Reviewed - No data to display  EKG None  Radiology No results found.  Procedures Procedures (including critical care  time)  Medications Ordered in UC Medications - No data to display  Initial Impression / Assessment and Plan / UC Course  I have reviewed the triage vital signs and the nursing notes.  Pertinent labs & imaging results that were available during my care  of the patient were reviewed by me and considered in my medical decision making (see chart for details).    Final Clinical Impressions(s) / UC Diagnoses   Final diagnoses:  Strain of left knee, initial encounter     Discharge Instructions     Follow up with your primary care provider next week.  Use the leg brace just when you are walking about    ED Prescriptions    Medication Sig Dispense Auth. Provider   predniSONE (DELTASONE) 20 MG tablet Two daily with food 10 tablet Elvina Sidle, MD     Controlled Substance Prescriptions [place] Controlled Substance Registry consulted? Yes, I have consulted the [place] Controlled Substances Registry for this patient, and feel the risk/benefit ratio today is favorable for proceeding with this prescription for a controlled substance.   Elvina Sidle, MD 08/26/18 [phone]

## 2018-08-26 NOTE — Discharge Instructions (Signed)
Follow up with your primary care provider next week.  Use the leg brace just when you are walking about

## 2019-02-22 ENCOUNTER — Emergency Department (HOSPITAL_BASED_OUTPATIENT_CLINIC_OR_DEPARTMENT_OTHER)
Admission: EM | Admit: 2019-02-22 | Discharge: 2019-02-22 | Disposition: A | Discharge status: Home / Self Care | Payer: Medicaid Other | Attending: Emergency Medicine | Admitting: Emergency Medicine

## 2019-02-22 ENCOUNTER — Other Ambulatory Visit: Payer: Self-pay

## 2019-02-22 ENCOUNTER — Encounter (HOSPITAL_BASED_OUTPATIENT_CLINIC_OR_DEPARTMENT_OTHER): Payer: Self-pay | Admitting: Emergency Medicine

## 2019-02-22 DIAGNOSIS — M5416 Radiculopathy, lumbar region: Secondary | ICD-10-CM | POA: Diagnosis not present

## 2019-02-22 DIAGNOSIS — I10 Essential (primary) hypertension: Secondary | ICD-10-CM | POA: Diagnosis not present

## 2019-02-22 DIAGNOSIS — Z79899 Other long term (current) drug therapy: Secondary | ICD-10-CM | POA: Insufficient documentation

## 2019-02-22 DIAGNOSIS — M545 Low back pain: Secondary | ICD-10-CM | POA: Diagnosis present

## 2019-02-22 MED ORDER — DIPHENHYDRAMINE HCL 25 MG PO CAPS
25.0000 mg | ORAL_CAPSULE | Freq: Once | ORAL | Status: AC
  Administered 2019-02-22: 03:00:00 25 mg via ORAL
  Filled 2019-02-22: qty 1

## 2019-02-22 MED ORDER — ONDANSETRON 8 MG PO TBDP
8.0000 mg | ORAL_TABLET | Freq: Once | ORAL | Status: AC
  Administered 2019-02-22: 8 mg via ORAL
  Filled 2019-02-22: qty 1

## 2019-02-22 MED ORDER — HYDROMORPHONE HCL 1 MG/ML IJ SOLN
2.0000 mg | Freq: Once | INTRAMUSCULAR | Status: AC
  Administered 2019-02-22: 2 mg via INTRAMUSCULAR
  Filled 2019-02-22: qty 2

## 2019-02-22 NOTE — ED Notes (Signed)
Pt is c/o itching from the pain medication  Requesting benadryl

## 2019-02-22 NOTE — ED Provider Notes (Signed)
[facility] Provider Note: Kirsten DellJ. Lane Kimon Loewen, MD, FACEP  CSN: [id number] MRN: [id number] ARRIVAL: 02/22/19 at 0113 ROOM: [facility]   CHIEF COMPLAINT  Back Pain   HISTORY OF PRESENT ILLNESS  02/22/19 1:26 AM Kirsten Walsh is a 58 y.o. female who is on chronic oxycodone treatment.  She last received 120, 5 mg oxycodone tablets on February 07, 2019.  She states this is for migraines.  She is here with 8 days of left-sided low back pain radiating down her left leg and about the L4-L5 dermatome.  She rates her pain is a 10 out of 10.  It is worse with movement of the left hip.  She denies any numbness or weakness.  She denies any bowel or bladder changes.  When asked if she had contacted her pain management physician about this she replied "no, because it is a do with problem".  She denies any injury or heavy lifting that triggered this pain.    Past Medical History:  Diagnosis Date  . Chest pain   . Chronic lower back pain    "right side to mid back" (07/06/2017)  . Family history of adverse reaction to anesthesia    "daughter did; not sure happened"  . Gall stones   . Hypertension   . Kidney stone ~ 2006  . Migraine    "qd" (07/06/2017)  . Syncope and collapse 07/06/2017    sitting on the commode; developed nausea and passed out; hit head on shower and suffered a laceration; woke up in "a pool of blood"    Past Surgical History:  Procedure Laterality Date  . CYSTOSCOPY W/ STONE MANIPULATION  ~ 2006  . INTRAUTERINE DEVICE INSERTION     "initially put in in 04/2002; changed prn" (02/14/2013)  . LAPAROSCOPIC CHOLECYSTECTOMY  ~ 2004    Family History  Problem Relation Age of Onset  . Cancer Mother   . Pancreatic cancer Mother   . Cirrhosis Father   . Lupus Sister   . Diabetes Maternal Grandmother     Social History   Tobacco Use  . Smoking status: Never Smoker  . Smokeless tobacco: Never Used  Substance Use Topics  . Alcohol use: Yes    Comment: 07/06/2017  "couple mixed drinks/month"  . Drug use: No    Prior to Admission medications   Medication Sig Start Date End Date Taking? Authorizing Provider  butalbital-acetaminophen-caffeine (FIORICET, ESGIC) 50-325-40 MG tablet Take 1 tablet by mouth every 6 (six) hours as needed for headache or migraine.    [provider]  cloNIDine (CATAPRES) 0.1 MG tablet TAKE 1 TABLET(0.1 MG) BY MOUTH TWICE DAILY 08/28/18   Howard PouchFeng, Lauren, MD  Fremanezumab-vfrm (AJOVY) 225 MG/1.5ML SOSY Inject 1 Syringe into the skin every 30 (thirty) days. 06/27/17   Anson FretAhern, Antonia B, MD  hydrochlorothiazide (HYDRODIURIL) 25 MG tablet TAKE 1 TABLET(25 MG) BY MOUTH DAILY 08/28/18   Howard PouchFeng, Lauren, MD  ondansetron (ZOFRAN) 4 MG tablet Take 1 tablet (4 mg total) by mouth every 6 (six) hours. Patient taking differently: Take 4 mg by mouth as needed for nausea or vomiting.  12/28/17   Wurst, [place], PA-C  predniSONE (DELTASONE) 20 MG tablet Two daily with food 08/26/18   Elvina SidleLauenstein, Kurt, MD  SUMAtriptan (IMITREX) 100 MG tablet Take 1 tablet (100 mg total) by mouth once. May repeat in 2 hours if headache persists or recurs. 01/31/17 07/06/22  Almon HerculesGonfa, Taye T, MD  topiramate (TOPAMAX) 100 MG tablet TAKE 1 AND 1/2 TABLETS(150 MG)  BY MOUTH TWICE DAILY 08/01/18   Everrett Coombe, MD    Allergies Apple, Aspirin, Carrot [daucus carota], and Haloperidol and related   REVIEW OF SYSTEMS  Negative except as noted here or in the History of Present Illness.   PHYSICAL EXAMINATION  Initial Vital Signs Blood pressure 112/78, pulse 72, temperature 97.9 F (36.6 C), temperature source Oral, resp. rate 18, height 5\' 5"  (1.651 m), weight 85.7 kg, SpO2 100 %.  Examination General: Well-developed, well-nourished female in no acute distress; appearance consistent with age of record HENT: normocephalic; atraumatic Eyes: Cosmetic contact lenses Neck: supple Heart: regular rate and rhythm Lungs: clear to auscultation bilaterally Abdomen: soft;  nondistended; nontender; bowel sounds present Extremities: No deformity; pulses normal; decreased range of motion left lower extremity, notably left hip, due to pain Neurologic: Awake, alert and oriented; motor function intact in all extremities and symmetric but exam limited by pain in left lower extremity; sensation intact in lower extremities and symmetric; no facial droop Skin: Warm and dry Psychiatric: Grimacing   RESULTS  Summary of this visit's results, reviewed by myself:   EKG Interpretation  Date/Time:    Ventricular Rate:    PR Interval:    QRS Duration:   QT Interval:    QTC Calculation:   R Axis:     Text Interpretation:        Laboratory Studies: No results found for this or any previous visit (from the past 24 hour(s)). Imaging Studies: No results found.  ED COURSE and MDM  Nursing notes and initial vitals signs, including pulse oximetry, reviewed.  Vitals:   02/22/19 0121 02/22/19 0124  BP:  112/78  Pulse:  72  Resp:  18  Temp:  97.9 F (36.6 C)  TempSrc:  Oral  SpO2:  100%  Weight: 85.7 kg   Height: 5\' 5"  (1.651 m)    Patient advised we will treat her pain in the ED but cannot prescribe any additional narcotics as she is already on pain management.  She will need to contact her pain management physician, Dr. Angie Fava, regarding further outpatient treatment.  Will refer to neurosurgery for her lumbar radiculopathy.  There are no signs of cauda equina syndrome at this time.  PROCEDURES    ED DIAGNOSES     ICD-10-CM   1. Acute left lumbar radiculopathy  M54.16        Elise Gladden, MD 02/22/19 0140

## 2019-02-22 NOTE — ED Triage Notes (Signed)
pt c/o left sided lower back pain radiating down left leg x 8 days. No known injury.

## 2019-02-22 NOTE — ED Notes (Signed)
Waiting on ride home.

## 2019-03-26 ENCOUNTER — Ambulatory Visit: Payer: Medicaid Other | Attending: Student | Admitting: Physical Therapy

## 2019-03-26 ENCOUNTER — Other Ambulatory Visit: Payer: Self-pay

## 2019-03-26 ENCOUNTER — Encounter: Payer: Self-pay | Admitting: Physical Therapy

## 2019-03-26 DIAGNOSIS — M6281 Muscle weakness (generalized): Secondary | ICD-10-CM | POA: Insufficient documentation

## 2019-03-26 DIAGNOSIS — M5416 Radiculopathy, lumbar region: Secondary | ICD-10-CM | POA: Diagnosis not present

## 2019-03-26 NOTE — Therapy (Signed)
[place], [place], [address] Phone: [phone]   Fax:  [phone]  Physical Therapy Evaluation  Patient Details  Name: Kirsten Walsh MRN: [id number] Date of Birth: 1961/04/30 Referring Provider (PT): Glenford Peers, FNP   Encounter Date: 03/26/2019  PT End of Session - 03/26/19 1701    Visit Number  1    Number of Visits  4    Date for PT Re-Evaluation  04/23/19    Authorization Type  Medicaid    PT Start Time  0254    PT Stop Time  1635    PT Time Calculation (min)  42 min    Activity Tolerance  Patient limited by pain    Behavior During Therapy  Anxious       Past Medical History:  Diagnosis Date  . Chest pain   . Chronic lower back pain    "right side to mid back" (07/06/2017)  . Family history of adverse reaction to anesthesia    "daughter did; not sure happened"  . Gall stones   . Hypertension   . Kidney stone ~ 2006  . Migraine    "qd" (07/06/2017)  . Syncope and collapse 07/06/2017    sitting on the commode; developed nausea and passed out; hit head on shower and suffered a laceration; woke up in "a pool of blood"    Past Surgical History:  Procedure Laterality Date  . CYSTOSCOPY W/ STONE MANIPULATION  ~ 2006  . INTRAUTERINE DEVICE INSERTION     "initially put in in 04/2002; changed prn" (02/14/2013)  . LAPAROSCOPIC CHOLECYSTECTOMY  ~ 2004    There were no vitals filed for this visit.   Subjective Assessment - 03/26/19 1556    Subjective  Pt. is a 58 y/o female referred to PT for diagnosis lumbar radiculopathy. Pt. reports onset of symptoms in late August of this year including back and leg pain-initially had some symptoms on both sides in hips but then began having radiating pain down left leg distally to ankle region for which she was seen at the ED on 02/22/19. No mechanism of injury noted, attributes possible association of onset with doing exercise program with friends a  while back. Of note pt. has PMH migraines for which she is in pain management. She reports a history of nerve damage/associated weakness in her arms and legs (reports her pain mamangement MD did EMG/NCS to confirm, no copy of report available at time of eval. She also reports she has been having some UE symptoms with right hand pain/difficulty opening and closing right hand as well as left shoulder pain symptoms with difficulty for reaching activities. Pt. denies bowel or bladder changes or recent changes in balance.    Pertinent History  migraines, chronic pain in pain management (for migraines), chronic LBP, syncope    Limitations  Sitting;House hold activities;Lifting;Standing;Walking    Diagnostic tests  no lumbar imaging reported    Patient Stated Goals  get pain under control to return to school for graduate studies    Currently in Pain?  Yes    Pain Score  6     Pain Location  Back    Pain Orientation  Right;Left    Pain Descriptors / Indicators  Sharp;Burning    Pain Type  Acute pain    Pain Radiating Towards  left leg distal to ankle region    Pain Onset  More than a month ago    Pain Frequency  Constant  Aggravating Factors   turning in bed-twisting, SLR, "just depends" if worse sitting vs. standing    Pain Relieving Factors  wearing brace/corset    Effect of Pain on Daily Activities  limits positional tolerance and ability ADLs         [facility] PT Assessment - 03/26/19 0001      Assessment   Medical Diagnosis  Lumbar radiculopathy    Referring Provider (PT)  Verlin Dike, FNP    Onset Date/Surgical Date  02/14/19    Hand Dominance  Right    Prior Therapy  none      Precautions   Precautions  None      Restrictions   Weight Bearing Restrictions  No      Balance Screen   Has the patient fallen in the past 6 months  Yes    How many times?  4      Prior Function   Level of Independence  Independent with basic ADLs      Cognition   Overall Cognitive Status  Within  Functional Limits for tasks assessed      Sensation   Light Touch  Appears Intact   at bilat. L2-S2 dermatomes     Deep Tendon Reflexes   DTR Assessment Site  Patella;Achilles    Patella DTR  1+   bilat.   Achilles DTR  2+   bilat.     ROM / Strength   AROM / PROM / Strength  AROM;PROM;Strength      AROM   AROM Assessment Site  Lumbar    Lumbar Flexion  50   increased left LE pain from buttock into proximal thigh   Lumbar Extension  15    Lumbar - Right Side Bend  15    Lumbar - Left Side Bend  20    Lumbar - Right Rotation  20%    Lumbar - Left Rotation  50%      PROM   Overall PROM Comments  Limited ability assess hip ROM due to pain and limited positional tolerance      Strength   Overall Strength Comments  limited ability to assess hip MMTs due to pain/poor movement tolerance    Strength Assessment Site  Hip;Knee;Ankle    Right/Left Hip  Right;Left    Right Hip Flexion  4-/5    Left Hip Flexion  3-/5    Right/Left Knee  Right;Left    Right Knee Flexion  4-/5    Right Knee Extension  4-/5    Left Knee Flexion  4-/5    Left Knee Extension  4-/5    Right/Left Ankle  Right;Left    Right Ankle Dorsiflexion  4-/5    Right Ankle Inversion  4-/5    Right Ankle Eversion  4-/5    Left Ankle Dorsiflexion  4-/5    Left Ankle Inversion  4-/5    Left Ankle Eversion  4-/5      Special Tests   Other special tests  Pt. reported previous experience with significant exacerbation of LBP with assessment of SLR test so held this today, trial prone on elbows x 2 min-patient noted arm and shoulder soreness/fatigue and difficult to tell if helped centralize LLE pain      Bed Mobility   Bed Mobility  --   min assist for rolling, supine>sit after prone on elbows                Objective measurements completed on examination: See above  findings.      [facility] Adult PT Treatment/Exercise - 03/26/19 0001      Exercises   Exercises  Lumbar      Lumbar Exercises: Stretches    Other Lumbar Stretch Exercise  Instructed HEP for prone on elbows (instructed variation to passively prop on pillows if needed to minimize shoulder/arm pain and muscle fatigue vs. extension in standing as alternative             PT Education - 03/26/19 1701    Education Details  potential symptom etiology, HEP, POC    Person(s) Educated  Patient    Methods  Explanation;Demonstration;Verbal cues;Handout    Comprehension  Returned demonstration;Verbalized understanding          PT Long Term Goals - 03/26/19 1713      PT LONG TERM GOAL #1   Title  Independent with HEP    Baseline  needs HEP    Time  4    Period  Weeks    Status  New    Target Date  04/23/19      PT LONG TERM GOAL #2   Title  Increase trunk flexion AROM at least 10-20 deg to improve ability to donn shoes and perform chores at home    Baseline  50 deg    Time  4    Period  Weeks    Status  New    Target Date  04/23/19      PT LONG TERM GOAL #3   Title  Return demos for logrolling for bed mobility for turning in bed and supine<>sit to minimize LBP with these activities    Baseline  needs assist/cues at eval    Time  4    Period  Weeks    Status  New    Target Date  04/23/19             Plan - 03/26/19 1702    Clinical Impression Statement  Pt. presents with LBP with left LE radiating symptoms consistent with lumbar radiculopathy with extension bias for trunk ROM. Pt. also presents with diffuse pain symptoms including hand and shoulder pain as well as diffuse muscle weakness. No bowel/bladder changes noted. Would suspect UE symptoms likely separate issue from lumbar radiculopathy as no other myelopathy symptoms but will continue to monitor. Regarding weakness pt. reports history of nerve damage but unclear on etiology of this-pt. reports will request copy of EMG/NCS reports from pain management MD. Pt. would benefit from PT to help improve LBP/radicular symptoms and address associated functional  limitations.    Personal Factors and Comorbidities  Comorbidity 2    Comorbidities  chronic pain history, migraines, weakness with reported history nerve damage    Examination-Activity Limitations  Sit;Sleep;Squat;Lift;Bend;Hygiene/Grooming;Dressing;Bathing;Locomotion Level;Stand    Examination-Participation Restrictions  Laundry;Shop;Cleaning;Community Activity    Stability/Clinical Decision Making  Evolving/Moderate complexity    Clinical Decision Making  Moderate    Rehab Potential  Good    PT Frequency  1x / week    PT Duration  4 weeks    PT Treatment/Interventions  ADLs/Self Care Home Management;Electrical Stimulation;Moist Heat;Traction;Ultrasound;Cryotherapy;Therapeutic exercise;Therapeutic activities;Functional mobility training;Neuromuscular re-education;Patient/family education;Manual techniques;Dry needling;Taping    PT Next Visit Plan  Check response extension bias ROM from HEP for left LE symptoms, trial gentle hip distraction mobilization perform on RLE if needed due to limited tolerance LLE ROM, continue/progress gentle extension bias ROM monitoring UE soreness/pain, gentle neutral spine/hooklying core activation and strengthening with TA contraction/pelvi tilts and other hooklying vs.  seated or standing exercises, possible trial lumbar mechanical traction, modalities prn for pain    PT Home Exercise Plan  prone on elbows with variation propped on pillows to minimize UE strain vs. extension in standing, further exercises to be added pending tolerance at future visits    Consulted and Agree with Plan of Care  Patient       Patient will benefit from skilled therapeutic intervention in order to improve the following deficits and impairments:  Pain, Impaired flexibility, Decreased strength, Decreased activity tolerance, Decreased range of motion, Difficulty walking  Visit Diagnosis: Radiculopathy, lumbar region  Muscle weakness (generalized)     Problem List Patient Active  Problem List   Diagnosis Date Noted  . Scalp laceration, subsequent encounter 07/20/2017  . Acute kidney injury (nontraumatic) (HCC)   . Diuretic-induced hypokalemia   . Scalp laceration, initial encounter   . Syncope 07/06/2017  . Healthcare maintenance 08/09/2016  . Chronic daily headache 09/02/2015  . 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 Syncopal episodes 09/02/2015  . Right arm weakness 08/05/2015  . Edema of right arm and right leg 09/07/2014  . IUD contraception 07/18/2014  . History of anaphylaxis 06/26/2014  . Intractable migraine with status migrainosus 06/10/2014  . Chronic migraine without aura, intractable, with status migrainosus   . Bradycardia 02/18/2013  . Cardiomyopathy 10/20/2011  . Decreased ejection fraction 10/05/2011  . Leg swelling 09/23/2011  . Complex regional pain syndrome 05/03/2011  . Chronic pain 03/29/2011  . NECK PAIN, RIGHT 02/03/2010  . ALLERGIC RHINITIS 01/30/2009  . DIZZINESS 07/16/2008  . Chronic migraine 04/26/2007  . OBESITY, NOS 08/18/2006  . HYPERTENSION, BENIGN SYSTEMIC 08/18/2006    Lazarus Gowda, PT, DPT 03/26/19 5:19 PM  [facility] [address] [place], [place], [address] Phone: [phone]   Fax:  [phone]  Name: Kirsten Walsh MRN: [id number] Date of Birth: [birth year]

## 2019-04-02 ENCOUNTER — Other Ambulatory Visit: Payer: Self-pay

## 2019-04-02 ENCOUNTER — Encounter: Payer: Self-pay | Admitting: Physical Therapy

## 2019-04-02 ENCOUNTER — Ambulatory Visit: Payer: Medicaid Other | Admitting: Physical Therapy

## 2019-04-02 DIAGNOSIS — M5416 Radiculopathy, lumbar region: Secondary | ICD-10-CM | POA: Diagnosis not present

## 2019-04-02 DIAGNOSIS — M6281 Muscle weakness (generalized): Secondary | ICD-10-CM

## 2019-04-02 NOTE — Therapy (Signed)
[place], [place], [address] Phone: [phone]   Fax:  [phone]  Physical Therapy Treatment  Patient Details  Name: Kirsten Walsh MRN: [id number] Date of Birth: 1960-11-12 Referring Provider (PT): Glenford Peers, FNP   Encounter Date: 04/02/2019  PT End of Session - 04/02/19 1802    Visit Number  2    Number of Visits  4    Date for PT Re-Evaluation  04/23/19    Authorization Type  Medicaid    Authorization Time Period  04/02/19-04/22/19    Authorization - Visit Number  1    Authorization - Number of Visits  3    PT Start Time  9211   pt. arrived late   PT Stop Time  1809    PT Time Calculation (min)  43 min    Activity Tolerance  Patient tolerated treatment well    Behavior During Therapy  [facility] for tasks assessed/performed       Past Medical History:  Diagnosis Date  . Chest pain   . Chronic lower back pain    "right side to mid back" (07/06/2017)  . Family history of adverse reaction to anesthesia    "daughter did; not sure happened"  . Gall stones   . Hypertension   . Kidney stone ~ 2006  . Migraine    "qd" (07/06/2017)  . Syncope and collapse 07/06/2017    sitting on the commode; developed nausea and passed out; hit head on shower and suffered a laceration; woke up in "a pool of blood"    Past Surgical History:  Procedure Laterality Date  . CYSTOSCOPY W/ STONE MANIPULATION  ~ 2006  . INTRAUTERINE DEVICE INSERTION     "initially put in in 04/2002; changed prn" (02/14/2013)  . LAPAROSCOPIC CHOLECYSTECTOMY  ~ 2004    There were no vitals filed for this visit.  Subjective Assessment - 04/02/19 1729    Subjective  Pt. returns for first follow up visit since eval. Of note she reports went to ED yesterday due to allergic reaction to food (carrot allergy) from restaurant and had had brief LOC. Regarding HEP she reports mild relief with standing extension exercises (performed  variation using hands at wall). For leg pain her current symptoms are on right side (was previously on left) and she attributes this to possible association with fall from food allergy episode prior to ED visit.    Pertinent History  migraines, chronic pain in pain management (for migraines), chronic LBP, syncope    Limitations  Sitting;House hold activities;Lifting;Standing;Walking    Diagnostic tests  no lumbar imaging reported    Patient Stated Goals  get pain under control to return to school for graduate studies    Currently in Pain?  Yes    Pain Score  7     Pain Location  Back    Pain Orientation  Left                       [facility] Adult PT Treatment/Exercise - 04/02/19 0001      Lumbar Exercises: Stretches   Prone on Elbows Stretch  --   2 minutes   Press Ups  10 reps    Press Ups Limitations  prone press up on elbows      Lumbar Exercises: Standing   Other Standing Lumbar Exercises  extension in standing with belt use 2x10, extension in standing with hands on wall x 10 reps  Modalities   Modalities  Traction      Traction   Type of Traction  Lumbar    Min (lbs)  0    Max (lbs)  40    Hold Time  60 sec    Rest Time  20 sec    Time  10 minutes      Manual Therapy   Manual Therapy  Joint mobilization    Joint Mobilization  Long axis distraction to ea. hip bilat., oscillations in neutral hip position grade I-III             PT Education - 04/02/19 1802    Education Details  HEP, POC, traction    Person(s) Educated  Patient    Methods  Explanation;Demonstration;Verbal cues    Comprehension  Verbalized understanding;Returned demonstration          PT Long Term Goals - 03/26/19 1713      PT LONG TERM GOAL #1   Title  Independent with HEP    Baseline  needs HEP    Time  4    Period  Weeks    Status  New    Target Date  04/23/19      PT LONG TERM GOAL #2   Title  Increase trunk flexion AROM at least 10-20 deg to improve ability to  donn shoes and perform chores at home    Baseline  50 deg    Time  4    Period  Weeks    Status  New    Target Date  04/23/19      PT LONG TERM GOAL #3   Title  Return demos for logrolling for bed mobility for turning in bed and supine<>sit to minimize LBP with these activities    Baseline  needs assist/cues at eval    Time  4    Period  Weeks    Status  New    Target Date  04/23/19            Plan - 04/02/19 1803    Clinical Impression Statement  Still with some difficulty assessing centralization response  due to more diffuse pain symptoms but pt. does appear to have had mild benefit from extension bias ROM exercises with some ease of previously noted left LE radicular symptoms. Today noting pain on right but as pt. had fall yesterday with impact to right leg prior to going to ED would suspect local soft tissue injury/soreness rather than radicular etiology for right-sided symptoms though will continue to monitor. Pt. had arm soreness with press up on elbows but tolerated standing extension well particularly with use of belt. Trial mechanical lumbar traction today was well-tolerated so will await further response but plan continue/progress at next visit.    Personal Factors and Comorbidities  Comorbidity 2    Comorbidities  chronic pain history, migraines, weakness with reported history nerve damage    Examination-Activity Limitations  Sit;Sleep;Squat;Lift;Bend;Hygiene/Grooming;Dressing;Bathing;Locomotion Level;Stand    Examination-Participation Restrictions  Laundry;Shop;Cleaning;Community Activity    Stability/Clinical Decision Making  Evolving/Moderate complexity    Clinical Decision Making  Moderate    Rehab Potential  Good    PT Frequency  1x / week    PT Duration  4 weeks    PT Treatment/Interventions  ADLs/Self Care Home Management;Electrical Stimulation;Moist Heat;Traction;Ultrasound;Cryotherapy;Therapeutic exercise;Therapeutic activities;Functional mobility  training;Neuromuscular re-education;Patient/family education;Manual techniques;Dry needling;Taping    PT Next Visit Plan  Check response traction from trial today and continue/progress as tolerated, extension bias lumbar ROM and gentle stretches,  try adding neutral spine/hooklying core activation with TA contraction, pelvic tilts (no time today)    PT Home Exercise Plan  extension in standing vs. prone on elbow with passive stretch variation propped on pillow due to arm soreness    Consulted and Agree with Plan of Care  Patient       Patient will benefit from skilled therapeutic intervention in order to improve the following deficits and impairments:  Pain, Impaired flexibility, Decreased strength, Decreased activity tolerance, Decreased range of motion, Difficulty walking  Visit Diagnosis: Radiculopathy, lumbar region  Muscle weakness (generalized)     Problem List Patient Active Problem List   Diagnosis Date Noted  . Scalp laceration, subsequent encounter 07/20/2017  . Acute kidney injury (nontraumatic) (HCC)   . Diuretic-induced hypokalemia   . Scalp laceration, initial encounter   . Syncope 07/06/2017  . Healthcare maintenance 08/09/2016  . Chronic daily headache 09/02/2015  . 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 Syncopal episodes 09/02/2015  . Right arm weakness 08/05/2015  . Edema of right arm and right leg 09/07/2014  . IUD contraception 07/18/2014  . History of anaphylaxis 06/26/2014  . Intractable migraine with status migrainosus 06/10/2014  . Chronic migraine without aura, intractable, with status migrainosus   . Bradycardia 02/18/2013  . Cardiomyopathy 10/20/2011  . Decreased ejection fraction 10/05/2011  . Leg swelling 09/23/2011  . Complex regional pain syndrome 05/03/2011  . Chronic pain 03/29/2011  . NECK PAIN, RIGHT 02/03/2010  . ALLERGIC RHINITIS 01/30/2009  . DIZZINESS 07/16/2008  . Chronic migraine 04/26/2007  . OBESITY, NOS 08/18/2006  . HYPERTENSION,  BENIGN SYSTEMIC 08/18/2006    Lazarus Gowda, PT, DPT 04/02/19 6:09 PM  [facility] [place] [address] [place], [place], [address] Phone: [phone]   Fax:  [phone]  Name: Kirsten Walsh MRN: [id number] Date of Birth: 1960-11-06

## 2019-04-05 ENCOUNTER — Emergency Department (HOSPITAL_COMMUNITY): Payer: Medicaid Other

## 2019-04-05 ENCOUNTER — Emergency Department (HOSPITAL_COMMUNITY)
Admission: EM | Admit: 2019-04-05 | Discharge: 2019-04-05 | Disposition: A | Discharge status: Home / Self Care | Payer: Medicaid Other | Attending: Emergency Medicine | Admitting: Emergency Medicine

## 2019-04-05 DIAGNOSIS — E876 Hypokalemia: Secondary | ICD-10-CM

## 2019-04-05 DIAGNOSIS — M79601 Pain in right arm: Secondary | ICD-10-CM | POA: Diagnosis not present

## 2019-04-05 DIAGNOSIS — M79604 Pain in right leg: Secondary | ICD-10-CM | POA: Insufficient documentation

## 2019-04-05 DIAGNOSIS — I10 Essential (primary) hypertension: Secondary | ICD-10-CM | POA: Diagnosis not present

## 2019-04-05 DIAGNOSIS — Z79899 Other long term (current) drug therapy: Secondary | ICD-10-CM | POA: Insufficient documentation

## 2019-04-05 DIAGNOSIS — R55 Syncope and collapse: Secondary | ICD-10-CM | POA: Insufficient documentation

## 2019-04-05 LAB — CK: Total CK: 67 U/L (ref 38–234)

## 2019-04-05 LAB — CBC WITH DIFFERENTIAL/PLATELET
Abs Immature Granulocytes: 0.03 10*3/uL (ref 0.00–0.07)
Basophils Absolute: 0 10*3/uL (ref 0.0–0.1)
Basophils Relative: 1 %
Eosinophils Absolute: 0.2 10*3/uL (ref 0.0–0.5)
Eosinophils Relative: 5 %
HCT: 42.1 % (ref 36.0–46.0)
Hemoglobin: 13.9 g/dL (ref 12.0–15.0)
Immature Granulocytes: 1 %
Lymphocytes Relative: 42 %
Lymphs Abs: 2.1 10*3/uL (ref 0.7–4.0)
MCH: 30.7 pg (ref 26.0–34.0)
MCHC: 33 g/dL (ref 30.0–36.0)
MCV: 92.9 fL (ref 80.0–100.0)
Monocytes Absolute: 0.4 10*3/uL (ref 0.1–1.0)
Monocytes Relative: 8 %
Neutro Abs: 2.1 10*3/uL (ref 1.7–7.7)
Neutrophils Relative %: 43 %
Platelets: 219 10*3/uL (ref 150–400)
RBC: 4.53 MIL/uL (ref 3.87–5.11)
RDW: 14 % (ref 11.5–15.5)
WBC: 4.8 10*3/uL (ref 4.0–10.5)
nRBC: 0 % (ref 0.0–0.2)

## 2019-04-05 LAB — BASIC METABOLIC PANEL
Anion gap: 12 (ref 5–15)
BUN: 15 mg/dL (ref 6–20)
CO2: 24 mmol/L (ref 22–32)
Calcium: 8.9 mg/dL (ref 8.9–10.3)
Chloride: 104 mmol/L (ref 98–111)
Creatinine, Ser: 1.16 mg/dL — ABNORMAL HIGH (ref 0.44–1.00)
GFR calc Af Amer: >60 mL/min (ref >60)
GFR calc non Af Amer: 53 mL/min — ABNORMAL LOW (ref >60)
Glucose, Bld: 97 mg/dL (ref 70–99)
Potassium: 3.2 mmol/L — ABNORMAL LOW (ref 3.5–5.1)
Sodium: 140 mmol/L (ref 135–145)

## 2019-04-05 LAB — I-STAT BETA HCG BLOOD, ED (MC, WL, AP ONLY): I-stat hCG, quantitative: <5 m[IU]/mL (ref <5)

## 2019-04-05 MED ORDER — SODIUM CHLORIDE 0.9 % IV BOLUS
1000.0000 mL | Freq: Once | INTRAVENOUS | Status: AC
  Administered 2019-04-05: 1000 mL via INTRAVENOUS

## 2019-04-05 MED ORDER — PROMETHAZINE HCL 25 MG/ML IJ SOLN
25.0000 mg | Freq: Once | INTRAMUSCULAR | Status: AC
  Administered 2019-04-05: 25 mg via INTRAMUSCULAR
  Filled 2019-04-05: qty 1

## 2019-04-05 MED ORDER — HYDROMORPHONE HCL 1 MG/ML IJ SOLN
1.0000 mg | Freq: Once | INTRAMUSCULAR | Status: AC
  Administered 2019-04-05: 19:00:00 1 mg via INTRAVENOUS
  Filled 2019-04-05: qty 1

## 2019-04-05 MED ORDER — POTASSIUM CHLORIDE CRYS ER 20 MEQ PO TBCR
40.0000 meq | EXTENDED_RELEASE_TABLET | Freq: Once | ORAL | Status: AC
  Administered 2019-04-05: 40 meq via ORAL
  Filled 2019-04-05: qty 2

## 2019-04-05 MED ORDER — KETOROLAC TROMETHAMINE 15 MG/ML IJ SOLN
15.0000 mg | Freq: Once | INTRAMUSCULAR | Status: AC
  Administered 2019-04-05: 15 mg via INTRAVENOUS
  Filled 2019-04-05: qty 1

## 2019-04-05 MED ORDER — POTASSIUM CHLORIDE CRYS ER 20 MEQ PO TBCR: 20.0000 meq | EXTENDED_RELEASE_TABLET | Freq: Every day | ORAL | 0 refills | Status: DC

## 2019-04-05 NOTE — ED Provider Notes (Signed)
[facility] Provider Note   CSN: [id number] Arrival date & time: 04/05/19  1704     History   Chief Complaint Chief Complaint  Patient presents with  . Arm Pain  . Loss of Consciousness    HPI Kirsten Walsh is a 58 y.o. female.     HPI  58 year old female presents with syncope and arm pain.  She was talking to a coworker at around 3:30 PM with her arms folded and standing up.  She put her hand on a desk and was leaning on it for a little bit.  She then developed severe pain radiating from her hand up to her shoulder and then down her back into her buttocks and right leg.  Right leg also severely painful.  Lasted several minutes and then all of a sudden she had passed out.  Still has this pain.  Never had chest pain, shortness of breath, vomiting, or focal weakness/numbness.  Has chronic, daily headaches.  The headache is a little worse than her typical daily headache but still feels like her typical migraine.  She has had multiple headaches worse than this in the past.  The headache did not get particularly worse just prior to the syncope. Has been having low back pain with left leg pain for a while now, is working with PT. No incontinence.  Patient tells me she has had numerous episodes of syncope. Usually associated with migraines.  Past Medical History:  Diagnosis Date  . Chest pain   . Chronic lower back pain    "right side to mid back" (07/06/2017)  . Family history of adverse reaction to anesthesia    "daughter did; not sure happened"  . Gall stones   . Hypertension   . Kidney stone ~ 2006  . Migraine    "qd" (07/06/2017)  . Syncope and collapse 07/06/2017    sitting on the commode; developed nausea and passed out; hit head on shower and suffered a laceration; woke up in "a pool of blood"    Patient Active Problem List   Diagnosis Date Noted  . Scalp laceration, subsequent encounter 07/20/2017  . Acute kidney injury  (nontraumatic) (HCC)   . Diuretic-induced hypokalemia   . Scalp laceration, initial encounter   . Syncope 07/06/2017  . Healthcare maintenance 08/09/2016  . Chronic daily headache 09/02/2015  . 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 Syncopal episodes 09/02/2015  . Right arm weakness 08/05/2015  . Edema of right arm and right leg 09/07/2014  . IUD contraception 07/18/2014  . History of anaphylaxis 06/26/2014  . Intractable migraine with status migrainosus 06/10/2014  . Chronic migraine without aura, intractable, with status migrainosus   . Bradycardia 02/18/2013  . Cardiomyopathy 10/20/2011  . Decreased ejection fraction 10/05/2011  . Leg swelling 09/23/2011  . Complex regional pain syndrome 05/03/2011  . Chronic pain 03/29/2011  . NECK PAIN, RIGHT 02/03/2010  . ALLERGIC RHINITIS 01/30/2009  . DIZZINESS 07/16/2008  . Chronic migraine 04/26/2007  . OBESITY, NOS 08/18/2006  . HYPERTENSION, BENIGN SYSTEMIC 08/18/2006    Past Surgical History:  Procedure Laterality Date  . CYSTOSCOPY W/ STONE MANIPULATION  ~ 2006  . INTRAUTERINE DEVICE INSERTION     "initially put in in 04/2002; changed prn" (02/14/2013)  . LAPAROSCOPIC CHOLECYSTECTOMY  ~ 2004     OB History   No obstetric history on file.      Home Medications    Prior to Admission medications   Medication Sig Start Date End  Date Taking? Authorizing Provider  butalbital-acetaminophen-caffeine (FIORICET, ESGIC) 50-325-40 MG tablet Take 1 tablet by mouth every 6 (six) hours as needed for headache or migraine.    [provider]  cloNIDine (CATAPRES) 0.1 MG tablet TAKE 1 TABLET(0.1 MG) BY MOUTH TWICE DAILY 08/28/18   Howard Pouch, MD  Fremanezumab-vfrm (AJOVY) 225 MG/1.5ML SOSY Inject 1 Syringe into the skin every 30 (thirty) days. Patient not taking: Reported on 03/26/2019 06/27/17   Anson Fret, MD  hydrochlorothiazide (HYDRODIURIL) 25 MG tablet TAKE 1 TABLET(25 MG) BY MOUTH DAILY 08/28/18   Howard Pouch, MD  ondansetron  (ZOFRAN) 4 MG tablet Take 1 tablet (4 mg total) by mouth every 6 (six) hours. Patient taking differently: Take 4 mg by mouth as needed for nausea or vomiting.  12/28/17   Wurst, [place], PA-C  oxyCODONE (OXY IR/ROXICODONE) 5 MG immediate release tablet Take 5 mg by mouth every 4 (four) hours as needed for severe pain.    [provider]  potassium chloride SA (KLOR-CON) 20 MEQ tablet Take 1 tablet (20 mEq total) by mouth daily for 3 days. 04/05/19 04/08/19  Pricilla Loveless, MD  predniSONE (DELTASONE) 20 MG tablet Two daily with food Patient not taking: Reported on 03/26/2019 08/26/18   Elvina Sidle, MD  SUMAtriptan (IMITREX) 100 MG tablet Take 1 tablet (100 mg total) by mouth once. May repeat in 2 hours if headache persists or recurs. 01/31/17 07/06/22  Almon Hercules, MD  topiramate (TOPAMAX) 100 MG tablet TAKE 1 AND 1/2 TABLETS(150 MG) BY MOUTH TWICE DAILY 08/01/18   Howard Pouch, MD    Family History Family History  Problem Relation Age of Onset  . Cancer Mother   . Pancreatic cancer Mother   . Cirrhosis Father   . Lupus Sister   . Diabetes Maternal Grandmother     Social History Social History   Tobacco Use  . Smoking status: Never Smoker  . Smokeless tobacco: Never Used  Substance Use Topics  . Alcohol use: Yes    Comment: 07/06/2017 "couple mixed drinks/month"  . Drug use: No     Allergies   Apple, Aspirin, Carrot [daucus carota], and Haloperidol and related   Review of Systems Review of Systems  Constitutional: Negative for fever.  Respiratory: Negative for shortness of breath.   Cardiovascular: Negative for chest pain.  Gastrointestinal: Negative for abdominal pain and vomiting.  Musculoskeletal: Positive for arthralgias, back pain and neck pain.  Neurological: Positive for headaches. Negative for weakness and numbness.  All other systems reviewed and are negative.    Physical Exam Updated Vital Signs BP (!) 143/98 (BP Location: Right Arm)   Pulse 73    Temp 98.3 F (36.8 C) (Oral)   Resp 17   SpO2 100%   Physical Exam Vitals signs and nursing note reviewed.  Constitutional:      Appearance: She is well-developed.  HENT:     Head: Normocephalic and atraumatic.     Right Ear: External ear normal.     Left Ear: External ear normal.     Nose: Nose normal.  Eyes:     General:        Right eye: No discharge.        Left eye: No discharge.  Cardiovascular:     Rate and Rhythm: Normal rate and regular rhythm.     Pulses:          Radial pulses are 2+ on the right side.       Dorsalis pedis  pulses are 2+ on the right side and 2+ on the left side.     Heart sounds: Normal heart sounds.  Pulmonary:     Effort: Pulmonary effort is normal.     Breath sounds: Normal breath sounds.  Abdominal:     Palpations: Abdomen is soft.     Tenderness: There is no abdominal tenderness.  Musculoskeletal:     Comments: Diffuse tenderness in RUE, RLE, LLE. Hard to localize the most painful part. At rest it's not as bad, but with any type of movement of any joint she endorses severe pain. Does range her hip on her own but is limited in R shoulder  Skin:    General: Skin is warm and dry.  Neurological:     Mental Status: She is alert.     Comments: CN 3-12 grossly intact. 5/5 strength in all 4 extremities. Grossly normal sensation. Normal finger to nose.  Psychiatric:        Mood and Affect: Mood is not anxious.      ED Treatments / Results  Labs (all labs ordered are listed, but only abnormal results are displayed) Labs Reviewed  BASIC METABOLIC PANEL - Abnormal; Notable for the following components:      Result Value   Potassium 3.2 (*)    Creatinine, Ser 1.16 (*)    GFR calc non Af Amer 53 (*)    All other components within normal limits  CBC WITH DIFFERENTIAL/PLATELET  CK  I-STAT BETA HCG BLOOD, ED ([facility], [facility], [facility] ONLY)    EKG EKG Interpretation  Date/Time:  Thursday April 05 2019 17:12:50 EDT Ventricular Rate:  80 PR Interval:     QRS Duration: 92 QT Interval:  400 QTC Calculation: 462 R Axis:   13 Text Interpretation:  Sinus rhythm no acute ST/T changes no significant change since 2019 Confirmed by Pricilla Loveless [phone]) on 04/05/2019 6:58:55 PM   Radiology Dg Chest 2 View  Result Date: 04/05/2019 CLINICAL DATA:  Syncopal episode. EXAM: CHEST - 2 VIEW COMPARISON:  11/11/2009 FINDINGS: The heart size and mediastinal contours are within normal limits. Both lungs are clear. The visualized skeletal structures are unremarkable. IMPRESSION: No active cardiopulmonary disease. Electronically Signed   By: Danae Orleans M.D.   On: 04/05/2019 19:59   Dg Shoulder Right  Result Date: 04/05/2019 CLINICAL DATA:  Syncopal episode. Fall. Right shoulder injury and pain. Initial encounter. EXAM: RIGHT SHOULDER - 2+ VIEW COMPARISON:  09/24/2015 FINDINGS: There is no evidence of fracture or dislocation. There is no evidence of arthropathy or other focal bone abnormality. Soft tissues are unremarkable. IMPRESSION: Negative. Electronically Signed   By: Danae Orleans M.D.   On: 04/05/2019 20:00    Procedures Procedures (including critical care time)  Medications Ordered in ED Medications  sodium chloride 0.9 % bolus 1,000 mL (0 mLs Intravenous Stopped 04/05/19 1945)  HYDROmorphone (DILAUDID) injection 1 mg (1 mg Intravenous Given 04/05/19 1832)  potassium chloride SA (KLOR-CON) CR tablet 40 mEq (40 mEq Oral Given 04/05/19 2036)  ketorolac (TORADOL) 15 MG/ML injection 15 mg (15 mg Intravenous Given 04/05/19 2036)  promethazine (PHENERGAN) injection 25 mg (25 mg Intramuscular Given 04/05/19 2100)     Initial Impression / Assessment and Plan / ED Course  I have reviewed the triage vital signs and the nursing notes.  Pertinent labs & imaging results that were available during my care of the patient were reviewed by me and considered in my medical decision making (see chart for details).  Patient's work-up is pretty  unremarkable.  Vital signs have been okay.  She does have some mild hypokalemia which will be repleted.  Otherwise, work-up is pretty benign.  She has a lot of areas of pain in her right upper and lower extremities, which are sensitive to minimal palpation.  I do not appreciate any swelling.  There is no focal numbness or weakness to be concern for acute stroke.  She does have headache in the setting of syncope but the headache is not really different than her typical migraines and not the worst she has had.  My suspicion for subarachnoid is pretty low.  I think her syncope was likely related to the pain and it sounds like she is had multiple prior syncopal episodes similar to painful stimuli.  At this point, I have pretty low suspicion for ACS, PE, dissection.  She appears stable for follow-up with her PCP and discharge home.  Final Clinical Impressions(s) / ED Diagnoses   Final diagnoses:  Syncope and collapse  Right arm pain  Right leg pain  Hypokalemia    ED Discharge Orders         Ordered    potassium chloride SA (KLOR-CON) 20 MEQ tablet  Daily     04/05/19 2116           Sherwood Gambler, MD 04/05/19 2128

## 2019-04-05 NOTE — ED Notes (Signed)
Patient transported to X-ray 

## 2019-04-05 NOTE — ED Triage Notes (Signed)
Came in via ems; reported started w/ right arm pain radiated through butt and leg, reported hx of migraine; and then passed out witnessed by co workers; but unknown duration per patient. Reported incident occurred about 3:30-4:00 pm today.

## 2019-04-11 ENCOUNTER — Encounter: Payer: Self-pay | Admitting: Physical Therapy

## 2019-04-11 ENCOUNTER — Other Ambulatory Visit: Payer: Self-pay

## 2019-04-11 ENCOUNTER — Ambulatory Visit: Payer: Medicaid Other | Admitting: Physical Therapy

## 2019-04-11 DIAGNOSIS — M5416 Radiculopathy, lumbar region: Secondary | ICD-10-CM

## 2019-04-11 DIAGNOSIS — M6281 Muscle weakness (generalized): Secondary | ICD-10-CM

## 2019-04-11 NOTE — Therapy (Signed)
[place], [place], [address] Phone: [phone]   Fax:  [phone]  Physical Therapy Treatment  Patient Details  Name: Kirsten Walsh MRN: [id number] Date of Birth: 10-21-1960 Referring Provider (PT): Glenford Peers, FNP   Encounter Date: 04/11/2019  PT End of Session - 04/11/19 1607    Visit Number  3    Number of Visits  4    Date for PT Re-Evaluation  04/23/19    Authorization Type  Medicaid    Authorization Time Period  04/02/19-04/22/19    Authorization - Visit Number  2    Authorization - Number of Visits  3    PT Start Time  9371   pt. arrived late   PT Stop Time  1634    PT Time Calculation (min)  40 min       Past Medical History:  Diagnosis Date  . Chest pain   . Chronic lower back pain    "right side to mid back" (07/06/2017)  . Family history of adverse reaction to anesthesia    "daughter did; not sure happened"  . Gall stones   . Hypertension   . Kidney stone ~ 2006  . Migraine    "qd" (07/06/2017)  . Syncope and collapse 07/06/2017    sitting on the commode; developed nausea and passed out; hit head on shower and suffered a laceration; woke up in "a pool of blood"    Past Surgical History:  Procedure Laterality Date  . CYSTOSCOPY W/ STONE MANIPULATION  ~ 2006  . INTRAUTERINE DEVICE INSERTION     "initially put in in 04/2002; changed prn" (02/14/2013)  . LAPAROSCOPIC CHOLECYSTECTOMY  ~ 2004    There were no vitals filed for this visit.  Subjective Assessment - 04/11/19 1609    Subjective  Pt. reports she did OK after traction trial last session/no symptom exacerbation. She continues with LBP today noted on right side with radiating down right leg (had previously been on left but onset/exacerbation of right side symptoms following fall as noted in subjective last visit following food allergy episode. Of note she went to ED last week    Pertinent History  migraines,  chronic pain in pain management (for migraines), chronic LBP, syncope    Limitations  Sitting;House hold activities;Lifting;Standing;Walking    Diagnostic tests  no lumbar imaging reported    Patient Stated Goals  get pain under control to return to school for graduate studies    Currently in Pain?  Yes    Pain Score  7     Pain Location  Back    Pain Orientation  Right;Lower    Pain Descriptors / Indicators  Sharp;Burning    Pain Type  Acute pain    Pain Radiating Towards  right thigh and lower leg    Pain Onset  1 to 4 weeks ago    Pain Frequency  Constant    Aggravating Factors   walking and standing    Pain Relieving Factors  wearing brace/corset    Effect of Pain on Daily Activities  limits standing and walking tolerance                       [facility] Adult PT Treatment/Exercise - 04/11/19 0001      Lumbar Exercises: Stretches   Lower Trunk Rotation  5 reps;10 seconds    Pelvic Tilt  10 reps    Other Lumbar Stretch Exercise  extension  in standing with belt x 10 reps      Lumbar Exercises: Supine   Pelvic Tilt  --   see under lumbar stretches   Glut Set  10 reps    Other Supine Lumbar Exercises  hip adduction isometric with ball squeeze x 10 reps      Traction   Type of Traction  Lumbar    Min (lbs)  30    Max (lbs)  70    Hold Time  60 sec    Rest Time  20 sec    Time  10 minutes      Manual Therapy   Manual Therapy  Joint mobilization;Soft tissue mobilization    Joint Mobilization  Long axis distraction to ea. hip bilat., oscillations in neutral hip position grade I-III    Soft tissue mobilization  STM right lumbar paraspinals in left sidelying             PT Education - 04/11/19 1625    Education Details  POC    Person(s) Educated  Patient    Methods  Explanation    Comprehension  Verbalized understanding          PT Long Term Goals - 03/26/19 1713      PT LONG TERM GOAL #1   Title  Independent with HEP    Baseline  needs HEP     Time  4    Period  Weeks    Status  New    Target Date  04/23/19      PT LONG TERM GOAL #2   Title  Increase trunk flexion AROM at least 10-20 deg to improve ability to donn shoes and perform chores at home    Baseline  50 deg    Time  4    Period  Weeks    Status  New    Target Date  04/23/19      PT LONG TERM GOAL #3   Title  Return demos for logrolling for bed mobility for turning in bed and supine<>sit to minimize LBP with these activities    Baseline  needs assist/cues at eval    Time  4    Period  Weeks    Status  New    Target Date  04/23/19            Plan - 04/11/19 1625    Clinical Impression Statement  Given traction well-tolerated last session increased to 70 lbs. to day with good tolerance. Still with extension bias for trunk ROM so continued standing extension given limited tolerance prone exercises due to arm soreness. Added STM to right lumbar paraspinals given soreness this area with fair tolerance from tenderness to palpation,    Personal Factors and Comorbidities  Comorbidity 2    Comorbidities  chronic pain history, migraines, weakness with reported history nerve damage    Examination-Activity Limitations  Sit;Sleep;Squat;Lift;Bend;Hygiene/Grooming;Dressing;Bathing;Locomotion Level;Stand    Examination-Participation Restrictions  Laundry;Shop;Cleaning;Community Activity    Stability/Clinical Decision Making  Evolving/Moderate complexity    Clinical Decision Making  Moderate    Rehab Potential  Good    PT Duration  4 weeks    PT Treatment/Interventions  ADLs/Self Care Home Management;Electrical Stimulation;Moist Heat;Traction;Ultrasound;Cryotherapy;Therapeutic exercise;Therapeutic activities;Functional mobility training;Neuromuscular re-education;Patient/family education;Manual techniques;Dry needling;Taping    PT Next Visit Plan  continue/progress traction as tolerated, extension bias ROM in standing, manual as needed, modalities prn    PT Home Exercise  Plan  extension in standing vs. prone on elbow with passive stretch  variation propped on pillow due to arm soreness    Consulted and Agree with Plan of Care  Patient       Patient will benefit from skilled therapeutic intervention in order to improve the following deficits and impairments:  Pain, Impaired flexibility, Decreased strength, Decreased activity tolerance, Decreased range of motion, Difficulty walking  Visit Diagnosis: Radiculopathy, lumbar region  Muscle weakness (generalized)     Problem List Patient Active Problem List   Diagnosis Date Noted  . Scalp laceration, subsequent encounter 07/20/2017  . Acute kidney injury (nontraumatic) (HCC)   . Diuretic-induced hypokalemia   . Scalp laceration, initial encounter   . Syncope 07/06/2017  . Healthcare maintenance 08/09/2016  . Chronic daily headache 09/02/2015  . 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 Syncopal episodes 09/02/2015  . Right arm weakness 08/05/2015  . Edema of right arm and right leg 09/07/2014  . IUD contraception 07/18/2014  . History of anaphylaxis 06/26/2014  . Intractable migraine with status migrainosus 06/10/2014  . Chronic migraine without aura, intractable, with status migrainosus   . Bradycardia 02/18/2013  . Cardiomyopathy 10/20/2011  . Decreased ejection fraction 10/05/2011  . Leg swelling 09/23/2011  . Complex regional pain syndrome 05/03/2011  . Chronic pain 03/29/2011  . NECK PAIN, RIGHT 02/03/2010  . ALLERGIC RHINITIS 01/30/2009  . DIZZINESS 07/16/2008  . Chronic migraine 04/26/2007  . OBESITY, NOS 08/18/2006  . HYPERTENSION, BENIGN SYSTEMIC 08/18/2006    Lazarus Gowdahristopher Levern Pitter, PT, DPT 04/11/19 4:33 PM  [facility] [address] [place], [place], [address] Phone: [phone]   Fax:  [phone]  Name: [name] MRN: [id number] Date of Birth: May 04, 1961

## 2019-04-11 NOTE — Therapy (Signed)
[facility] [address] [place], [place], [address] Phone: [phone]   Fax:  [phone]  Physical Therapy Treatment  Patient Details  Name: Linard MillersConstance M Gladman MRN: [id number] Date of Birth: [birth year] Referring Provider (PT): Verlin DikeKimberly Meyran, FNP   Encounter Date: 04/11/2019  PT End of Session - 04/11/19 1607    Visit Number  3    Number of Visits  4    Date for PT Re-Evaluation  04/23/19    Authorization Type  Medicaid    Authorization Time Period  04/02/19-04/22/19    Authorization - Visit Number  2    Authorization - Number of Visits  3    PT Start Time  1554   pt. arrived late      Past Medical History:  Diagnosis Date  . Chest pain   . Chronic lower back pain    "right side to mid back" (07/06/2017)  . Family history of adverse reaction to anesthesia    "daughter did; not sure happened"  . Gall stones   . Hypertension   . Kidney stone ~ 2006  . Migraine    "qd" (07/06/2017)  . Syncope and collapse 07/06/2017    sitting on the commode; developed nausea and passed out; hit head on shower and suffered a laceration; woke up in "a pool of blood"    Past Surgical History:  Procedure Laterality Date  . CYSTOSCOPY W/ STONE MANIPULATION  ~ 2006  . INTRAUTERINE DEVICE INSERTION     "initially put in in 04/2002; changed prn" (02/14/2013)  . LAPAROSCOPIC CHOLECYSTECTOMY  ~ 2004    There were no vitals filed for this visit.  Subjective Assessment - 04/11/19 1609    Subjective  Pt. reports she did OK after traction trial last session/no symptom exacerbation. She continues with LBP today noted on right side with radiating down right leg (had previously been on left but onset/exacerbation of right side symptoms following fall as noted in subjective last visit following food allergy episode. Of note she went to ED last week    Pertinent History  migraines, chronic pain in pain management (for migraines), chronic LBP,  syncope    Limitations  Sitting;House hold activities;Lifting;Standing;Walking    Diagnostic tests  no lumbar imaging reported    Patient Stated Goals  get pain under control to return to school for graduate studies    Currently in Pain?  Yes    Pain Score  7     Pain Location  Back    Pain Orientation  Right;Lower    Pain Descriptors / Indicators  Sharp;Burning    Pain Type  Acute pain    Pain Radiating Towards  right thigh and lower leg    Pain Onset  1 to 4 weeks ago    Pain Frequency  Constant    Aggravating Factors   walking and standing    Pain Relieving Factors  wearing brace/corset    Effect of Pain on Daily Activities  limits standing and walking tolerance                       [facility] Adult PT Treatment/Exercise - 04/11/19 0001      Lumbar Exercises: Stretches   Lower Trunk Rotation  5 reps;10 seconds    Pelvic Tilt  10 reps    Other Lumbar Stretch Exercise  extension in standing with belt x 10 reps      Lumbar Exercises: Supine   Pelvic  Tilt  --   see under lumbar stretches   Glut Set  10 reps    Other Supine Lumbar Exercises  hip adduction isometric with ball squeeze x 10 reps      Traction   Type of Traction  Lumbar    Min (lbs)  30    Max (lbs)  70    Hold Time  60 sec    Rest Time  20 sec    Time  10 minutes      Manual Therapy   Manual Therapy  Joint mobilization;Soft tissue mobilization    Joint Mobilization  Long axis distraction to ea. hip bilat., oscillations in neutral hip position grade I-III    Soft tissue mobilization  STM right lumbar paraspinals in left sidelying             PT Education - 04/11/19 1625    Education Details  POC    Person(s) Educated  Patient    Methods  Explanation    Comprehension  Verbalized understanding          PT Long Term Goals - 03/26/19 1713      PT LONG TERM GOAL #1   Title  Independent with HEP    Baseline  needs HEP    Time  4    Period  Weeks    Status  New    Target Date   04/23/19      PT LONG TERM GOAL #2   Title  Increase trunk flexion AROM at least 10-20 deg to improve ability to donn shoes and perform chores at home    Baseline  50 deg    Time  4    Period  Weeks    Status  New    Target Date  04/23/19      PT LONG TERM GOAL #3   Title  Return demos for logrolling for bed mobility for turning in bed and supine<>sit to minimize LBP with these activities    Baseline  needs assist/cues at eval    Time  4    Period  Weeks    Status  New    Target Date  04/23/19            Plan - 04/11/19 1625    Clinical Impression Statement  Given traction well-tolerated last session increased to 70 lbs. to day with good tolerance. Still with extension bias for trunk ROM so continued standing extension given limited tolerance prone exercises due to arm soreness. Added STM to right lumbar paraspinals given soreness this area with fair tolerance from tenderness to palpation,    Personal Factors and Comorbidities  Comorbidity 2    Comorbidities  chronic pain history, migraines, weakness with reported history nerve damage    Examination-Activity Limitations  Sit;Sleep;Squat;Lift;Bend;Hygiene/Grooming;Dressing;Bathing;Locomotion Level;Stand    Examination-Participation Restrictions  Laundry;Shop;Cleaning;Community Activity    Stability/Clinical Decision Making  Evolving/Moderate complexity    Clinical Decision Making  Moderate    Rehab Potential  Good    PT Duration  4 weeks    PT Treatment/Interventions  ADLs/Self Care Home Management;Electrical Stimulation;Moist Heat;Traction;Ultrasound;Cryotherapy;Therapeutic exercise;Therapeutic activities;Functional mobility training;Neuromuscular re-education;Patient/family education;Manual techniques;Dry needling;Taping    PT Next Visit Plan  continue/progress traction as tolerated, extension bias ROM in standing, manual as needed, modalities prn    PT Home Exercise Plan  extension in standing vs. prone on elbow with passive  stretch variation propped on pillow due to arm soreness    Consulted and Agree with Plan of Care  Patient       Patient will benefit from skilled therapeutic intervention in order to improve the following deficits and impairments:  Pain, Impaired flexibility, Decreased strength, Decreased activity tolerance, Decreased range of motion, Difficulty walking  Visit Diagnosis: Radiculopathy, lumbar region  Muscle weakness (generalized)     Problem List Patient Active Problem List   Diagnosis Date Noted  . Scalp laceration, subsequent encounter 07/20/2017  . Acute kidney injury (nontraumatic) ([place])   . Diuretic-induced hypokalemia   . Scalp laceration, initial encounter   . Syncope 07/06/2017  . Healthcare maintenance 08/09/2016  . Chronic daily headache 09/02/2015  . 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 Syncopal episodes 09/02/2015  . Right arm weakness 08/05/2015  . Edema of right arm and right leg 09/07/2014  . IUD contraception 07/18/2014  . History of anaphylaxis 06/26/2014  . Intractable migraine with status migrainosus 06/10/2014  . Chronic migraine without aura, intractable, with status migrainosus   . Bradycardia 02/18/2013  . Cardiomyopathy 10/20/2011  . Decreased ejection fraction 10/05/2011  . Leg swelling 09/23/2011  . Complex regional pain syndrome 05/03/2011  . Chronic pain 03/29/2011  . NECK PAIN, RIGHT 02/03/2010  . ALLERGIC RHINITIS 01/30/2009  . DIZZINESS 07/16/2008  . Chronic migraine 04/26/2007  . OBESITY, NOS 08/18/2006  . HYPERTENSION, BENIGN SYSTEMIC 08/18/2006    Beaulah Dinning, PT, DPT 04/11/19 4:29 PM  [facility] [address] [place], [place], [address] Phone: [phone]   Fax:  [phone]  Name: CHANAY NUGENT MRN: [id number] Date of Birth: March 30, 1961

## 2019-04-16 ENCOUNTER — Telehealth: Payer: Self-pay | Admitting: Physical Therapy

## 2019-04-16 ENCOUNTER — Ambulatory Visit: Payer: Medicaid Other | Admitting: Physical Therapy

## 2019-04-16 NOTE — Telephone Encounter (Signed)
Called patient to check on status regarding no show for 4:30 appointment. She reports had family emergency and forgot-will call back to reschedule.

## 2019-04-30 ENCOUNTER — Other Ambulatory Visit: Payer: Self-pay

## 2019-04-30 ENCOUNTER — Encounter (HOSPITAL_COMMUNITY): Payer: Self-pay | Admitting: Emergency Medicine

## 2019-04-30 ENCOUNTER — Ambulatory Visit (HOSPITAL_COMMUNITY)
Admission: EM | Admit: 2019-04-30 | Discharge: 2019-04-30 | Disposition: A | Discharge status: Home / Self Care | Payer: Medicaid Other | Attending: Family Medicine | Admitting: Family Medicine

## 2019-04-30 ENCOUNTER — Ambulatory Visit (INDEPENDENT_AMBULATORY_CARE_PROVIDER_SITE_OTHER): Payer: Medicaid Other

## 2019-04-30 DIAGNOSIS — M545 Low back pain, unspecified: Secondary | ICD-10-CM

## 2019-04-30 DIAGNOSIS — S82002A Unspecified fracture of left patella, initial encounter for closed fracture: Secondary | ICD-10-CM

## 2019-04-30 DIAGNOSIS — M25562 Pain in left knee: Secondary | ICD-10-CM

## 2019-04-30 DIAGNOSIS — G43909 Migraine, unspecified, not intractable, without status migrainosus: Secondary | ICD-10-CM

## 2019-04-30 MED ORDER — CYCLOBENZAPRINE HCL 5 MG PO TABS: 5.0000 mg | ORAL_TABLET | Freq: Two times a day (BID) | ORAL | 0 refills | Status: DC | PRN

## 2019-04-30 MED ORDER — KETOROLAC TROMETHAMINE 30 MG/ML IJ SOLN
INTRAMUSCULAR | Status: AC
  Filled 2019-04-30: qty 1

## 2019-04-30 MED ORDER — DEXAMETHASONE SODIUM PHOSPHATE 10 MG/ML IJ SOLN
INTRAMUSCULAR | Status: AC
  Filled 2019-04-30: qty 1

## 2019-04-30 MED ORDER — PROMETHAZINE HCL 25 MG PO TABS: 25.0000 mg | ORAL_TABLET | Freq: Four times a day (QID) | ORAL | 0 refills | Status: AC | PRN

## 2019-04-30 MED ORDER — METOCLOPRAMIDE HCL 5 MG/ML IJ SOLN
5.0000 mg | Freq: Once | INTRAMUSCULAR | Status: AC
  Administered 2019-04-30: 5 mg via INTRAMUSCULAR

## 2019-04-30 MED ORDER — CELECOXIB 100 MG PO CAPS: 100.0000 mg | ORAL_CAPSULE | Freq: Two times a day (BID) | ORAL | 0 refills | Status: DC

## 2019-04-30 MED ORDER — METOCLOPRAMIDE HCL 5 MG/ML IJ SOLN
INTRAMUSCULAR | Status: AC
  Filled 2019-04-30: qty 2

## 2019-04-30 MED ORDER — KETOROLAC TROMETHAMINE 60 MG/2ML IM SOLN
30.0000 mg | Freq: Once | INTRAMUSCULAR | Status: AC
  Administered 2019-04-30: 30 mg via INTRAMUSCULAR

## 2019-04-30 MED ORDER — DEXAMETHASONE SODIUM PHOSPHATE 10 MG/ML IJ SOLN
10.0000 mg | Freq: Once | INTRAMUSCULAR | Status: AC
  Administered 2019-04-30: 10 mg via INTRAMUSCULAR
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
## 2019-04-30 NOTE — Discharge Instructions (Signed)
Patella fracture- wear knee immobilizer and crutches, non weight bearing Follow up  with orthopedics- contact info below May try celebrex twice daily Oxycodone for more severe pain  Follow up if pain worsening

## 2019-05-02 ENCOUNTER — Other Ambulatory Visit (HOSPITAL_COMMUNITY): Payer: Self-pay | Admitting: Sports Medicine

## 2019-05-02 ENCOUNTER — Other Ambulatory Visit: Payer: Self-pay | Admitting: Sports Medicine

## 2019-05-02 DIAGNOSIS — M25562 Pain in left knee: Secondary | ICD-10-CM
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
## 2019-05-11 ENCOUNTER — Other Ambulatory Visit: Payer: Self-pay

## 2019-05-11 ENCOUNTER — Ambulatory Visit (HOSPITAL_COMMUNITY)
Admission: RE | Admit: 2019-05-11 | Discharge: 2019-05-11 | Disposition: A | Discharge status: Home / Self Care | Payer: Medicaid Other | Source: Ambulatory Visit | Attending: Sports Medicine | Admitting: Sports Medicine

## 2019-05-11 DIAGNOSIS — M25562 Pain in left knee: Secondary | ICD-10-CM | POA: Insufficient documentation

## 2019-05-14 NOTE — Therapy (Signed)
[facility] [place], [place], [address] Phone: [phone]   Fax:  [phone]  Physical Therapy Treatment/Discharge  Patient Details  Name: Kirsten Walsh MRN: [id number] Date of Birth: January 21, 1961 Referring Provider (PT): Glenford Peers, FNP   Encounter Date: 04/11/2019    Past Medical History:  Diagnosis Date  . Chest pain   . Chronic lower back pain    "right side to mid back" (07/06/2017)  . Family history of adverse reaction to anesthesia    "daughter did; not sure happened"  . Gall stones   . Hypertension   . Kidney stone ~ 2006  . Migraine    "qd" (07/06/2017)  . Syncope and collapse 07/06/2017    sitting on the commode; developed nausea and passed out; hit head on shower and suffered a laceration; woke up in "a pool of blood"    Past Surgical History:  Procedure Laterality Date  . CYSTOSCOPY W/ STONE MANIPULATION  ~ 2006  . INTRAUTERINE DEVICE INSERTION     "initially put in in 04/2002; changed prn" (02/14/2013)  . LAPAROSCOPIC CHOLECYSTECTOMY  ~ 2004    There were no vitals filed for this visit.                                 PT Long Term Goals - 03/26/19 1713      PT LONG TERM GOAL #1   Title  Independent with HEP    Baseline  needs HEP    Time  4    Period  Weeks    Status  New    Target Date  04/23/19      PT LONG TERM GOAL #2   Title  Increase trunk flexion AROM at least 10-20 deg to improve ability to donn shoes and perform chores at home    Baseline  50 deg    Time  4    Period  Weeks    Status  New    Target Date  04/23/19      PT LONG TERM GOAL #3   Title  Return demos for logrolling for bed mobility for turning in bed and supine<>sit to minimize LBP with these activities    Baseline  needs assist/cues at eval    Time  4    Period  Weeks    Status  New    Target Date  04/23/19              Patient will benefit from skilled  therapeutic intervention in order to improve the following deficits and impairments:  Pain, Impaired flexibility, Decreased strength, Decreased activity tolerance, Decreased range of motion, Difficulty walking  Visit Diagnosis: Radiculopathy, lumbar region  Muscle weakness (generalized)     Problem List Patient Active Problem List   Diagnosis Date Noted  . Scalp laceration, subsequent encounter 07/20/2017  . Acute kidney injury (nontraumatic) ([place])   . Diuretic-induced hypokalemia   . Scalp laceration, initial encounter   . Syncope 07/06/2017  . Healthcare maintenance 08/09/2016  . Chronic daily headache 09/02/2015  . 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 Syncopal episodes 09/02/2015  . Right arm weakness 08/05/2015  . Edema of right arm and right leg 09/07/2014  . IUD contraception 07/18/2014  . History of anaphylaxis 06/26/2014  . Intractable migraine with status migrainosus 06/10/2014  . Chronic migraine without aura, intractable, with status migrainosus   . Bradycardia 02/18/2013  . Cardiomyopathy  10/20/2011  . Decreased ejection fraction 10/05/2011  . Leg swelling 09/23/2011  . Complex regional pain syndrome 05/03/2011  . Chronic pain 03/29/2011  . NECK PAIN, RIGHT 02/03/2010  . ALLERGIC RHINITIS 01/30/2009  . DIZZINESS 07/16/2008  . Chronic migraine 04/26/2007  . OBESITY, NOS 08/18/2006  . HYPERTENSION, BENIGN SYSTEMIC 08/18/2006       PHYSICAL THERAPY DISCHARGE SUMMARY  Visits from Start of Care: 3  Current functional level related to goals / functional outcomes: Patient did not return for further therapy after last session 04/11/19   Remaining deficits: NA   Education / Equipment: NA Plan:                                                    Patient goals were not met. Patient is being discharged due to not returning since the last visit.  ?????            Beaulah Dinning, PT, DPT 05/14/19 8:40 AM      [place]  [facility] [address] [place], [place], [address] Phone: [phone]   Fax:  [phone]  Name: Kirsten Walsh MRN: [id number] Date of Birth: 02-Dec-1960

## 2019-06-12 ENCOUNTER — Other Ambulatory Visit: Payer: Self-pay

## 2019-06-12 ENCOUNTER — Encounter: Payer: Self-pay | Admitting: Family Medicine

## 2019-06-12 ENCOUNTER — Ambulatory Visit (INDEPENDENT_AMBULATORY_CARE_PROVIDER_SITE_OTHER): Payer: Medicaid Other | Admitting: Family Medicine

## 2019-06-12 VITALS — Wt 217.4 lb

## 2019-06-12 DIAGNOSIS — R29898 Other symptoms and signs involving the musculoskeletal system: Secondary | ICD-10-CM

## 2019-06-12 DIAGNOSIS — R600 Localized edema: Secondary | ICD-10-CM | POA: Diagnosis present

## 2019-06-12 DIAGNOSIS — M79601 Pain in right arm: Secondary | ICD-10-CM | POA: Insufficient documentation

## 2019-06-12 DIAGNOSIS — M7989 Other specified soft tissue disorders: Secondary | ICD-10-CM
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
## 2019-06-12 NOTE — Patient Instructions (Addendum)
Please come to the lab appointment at 9:15 in the morning  PLEASE call orthopedist doctor to schedule appointment as soon as possible  Please go to [facility] front entrance to get the imaging you need.  Please schedule follow up with your PCP at your convenience.  Please go to the ED immediately if you develop ANY chest pain, difficulty breathing or shortness of breath.  Take care, Dr. Tarry Kos
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
## 2019-06-13 ENCOUNTER — Other Ambulatory Visit: Payer: Medicaid Other

## 2019-06-13 ENCOUNTER — Ambulatory Visit (HOSPITAL_COMMUNITY)
Admission: RE | Admit: 2019-06-13 | Discharge: 2019-06-13 | Disposition: A | Discharge status: Home / Self Care | Payer: Medicaid Other | Source: Ambulatory Visit | Attending: Family Medicine | Admitting: Family Medicine

## 2019-06-13 ENCOUNTER — Ambulatory Visit (HOSPITAL_COMMUNITY): Payer: Medicaid Other

## 2019-06-13 DIAGNOSIS — R600 Localized edema: Secondary | ICD-10-CM

## 2019-06-13 NOTE — Progress Notes (Signed)
Lower extremity venous doppler has been completed, results are located under CV Proc.  Marjie Chea RDCS 

## 2019-06-18 ENCOUNTER — Telehealth: Payer: Self-pay | Admitting: Family Medicine

## 2019-06-18 NOTE — Telephone Encounter (Signed)
**  After Hours/ Emergency Line Call**  Received a call to report that Kirsten Walsh regarding missed phone call.    Patient received a call 30 min ago from a doctor so was returning call.  Chart review it appears Dr. Tarry Kos was calling to let patient know that ultrasound of lower extremity was negative for DVT.  Also reviewed results which showed no evidence of DVT in the lower extremity and no evidence of obstruction in the proximal inguinal ligament.  No evidence of femoral vein obstruction.  Send this message to PCP as well as Dr. Tarry Kos and if they have any other input they can call patient back.  Patient very appreciative of this. Red flags discussed.   Caroline More, DO PGY-3, [facility] 06/18/2019 7:03 PM

## 2019-06-19 ENCOUNTER — Telehealth: Payer: Self-pay | Admitting: *Deleted

## 2019-06-19 NOTE — Telephone Encounter (Signed)
-----   Message from Danna Hefty, [place] sent at 06/18/2019  6:15 PM EST ----- Attempted to contact patient to discuss results without success. Please let patient know that the ultrasound of her leg was negative for a blood clot.

## 2019-06-19 NOTE — Telephone Encounter (Signed)
Pt informed. Shaquella Stamant, CMA  

## 2019-06-27 ENCOUNTER — Other Ambulatory Visit: Payer: Self-pay

## 2019-06-27 ENCOUNTER — Emergency Department (HOSPITAL_COMMUNITY)
Admission: EM | Admit: 2019-06-27 | Discharge: 2019-06-28 | Discharge status: Against Medical Advice | Payer: Medicaid Other | Attending: Emergency Medicine | Admitting: Emergency Medicine

## 2019-06-27 DIAGNOSIS — Z5321 Procedure and treatment not carried out due to patient leaving prior to being seen by health care provider: Secondary | ICD-10-CM | POA: Insufficient documentation

## 2019-06-27 DIAGNOSIS — M549 Dorsalgia, unspecified: Secondary | ICD-10-CM | POA: Insufficient documentation

## 2019-06-27 NOTE — ED Triage Notes (Signed)
To triage via EMS.  Pt was at work tonight and started having severe back spasms.  Has h/o of same and on oxycodone and muscle relaxers.  Pt reports she fell at work d/t pain with no injuries.  EMS BP 170/96 HR 68 RR 18 SpO2 100% CBG 100

## 2019-06-28 NOTE — ED Notes (Signed)
No answer for call to room

## 2019-07-17 ENCOUNTER — Telehealth: Payer: Self-pay | Admitting: *Deleted

## 2019-07-17 NOTE — Telephone Encounter (Signed)
I just called patient back and scheduled her for a appointment to do preop clearance on 07/24/2019

## 2019-07-17 NOTE — Telephone Encounter (Signed)
Pt lm on nurse line that she was returning a call to her PCP.  No messages, will forward to PCP. Jone Baseman, CMA

## 2019-07-24 ENCOUNTER — Other Ambulatory Visit: Payer: Self-pay

## 2019-07-24 ENCOUNTER — Ambulatory Visit (HOSPITAL_COMMUNITY)
Admission: RE | Admit: 2019-07-24 | Discharge: 2019-07-24 | Disposition: A | Discharge status: Home / Self Care | Payer: Medicaid Other | Source: Ambulatory Visit | Attending: Family Medicine | Admitting: Family Medicine

## 2019-07-24 ENCOUNTER — Ambulatory Visit (INDEPENDENT_AMBULATORY_CARE_PROVIDER_SITE_OTHER): Payer: Medicaid Other | Admitting: Family Medicine

## 2019-07-24 VITALS — BP 137/80 | HR 98 | Wt 215.0 lb

## 2019-07-24 DIAGNOSIS — Z01818 Encounter for other preprocedural examination: Secondary | ICD-10-CM | POA: Diagnosis present

## 2019-07-24 DIAGNOSIS — G90521 Complex regional pain syndrome I of right lower limb: Secondary | ICD-10-CM

## 2019-07-24 DIAGNOSIS — Z01810 Encounter for preprocedural cardiovascular examination: Secondary | ICD-10-CM | POA: Insufficient documentation
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
## 2019-07-24 NOTE — Patient Instructions (Addendum)
I was a pleasure to meet you today.  Pending your lab results I am going to medically clear you for surgery on your left knee.  Regarding your right leg swelling, numbness, pain I want to refer you to physical medicine rehabilitation for evaluation.  You may have what is called complex regional pain syndrome they may be able to help you with that.  GENERAL PRE-OPERATIVE PATIENT INSTRUCTIONS  Regarding preoperative instructions.  I would like you to hold off taking your pain medications on the day of surgery.  I would also like you to not take your Fioricet.  If you need medications for migraines you can take your Topamax.  Regarding your blood pressure medications you will need to continue taking your clonidine but should not take your hydrochlorothiazide.  If you have any further questions or concerns please feel free to reach out.   Complex Regional Pain Syndrome Complex regional pain syndrome (CRPS) is a nerve disorder that causes long-term (chronic) pain. This is usually in a hand, arm, foot, or leg. CRPS usually occurs after an injury or trauma, such as a fracture or sprain. There are two types of CRPS:  Type 1. This type occurs after an injury with no known damage to a nerve.  Type 2. This type occurs after an injury that damages a nerve. There are three stages of the condition:  Stage 1. This stage, called the acute stage, may last for up to 3 months.  Stage 2. This stage, called the dystrophic stage, may last for 3-12 months.  Stage 3. This stage, called the atrophic stage, may start after one year. CRPS ranges from mild to severe. For most people, CRPS is mild and recovery happens over time. For others, CRPS lasts for a very long time and makes everyday tasks hard to do. What are the causes? The exact cause of this condition is not known. It is usually triggered by an injury. What increases the risk? You are more likely to develop this condition if:  You are female.  You have  any of the following injuries: ? A wrist fracture that involves a lower arm bone (distal radius fracture). ? Ankle dislocation or fracture. ? A long surgery time. ? Possible nerve injury during surgery. What are the signs or symptoms? Signs and symptoms in the affected hand, arm, foot, or leg are different for each stage. Signs and symptoms of stage 1 include:  Burning pain.  A prickling, tingling feeling (pins and needles sensation).  Extremely sensitive skin.  Swelling.  Joint stiffness.  Warmth and redness.  Excessive sweating.  Hair and nail growth that is faster than normal. Signs and symptoms of stage 2 include:  Spreading of pain to the whole arm or leg.  Increased skin sensitivity.  Increased swelling and stiffness.  Coolness of the skin.  Blue discoloration of skin.  Loss of skin wrinkles.  Brittle fingernails. Signs and symptoms of stage 3 include:  Pain that spreads to other areas of the body but becomes less severe.  More stiffness, leading to loss of motion.  Skin that is pale, dry, shiny, or tightly stretched. How is this diagnosed? This condition may be diagnosed based on:  Your signs and symptoms.  A physical exam. There is no test to diagnose CRPS, but you may have tests:  To check for bone changes that might indicate CRPS. These tests may include an MRI or bone scan.  To rule out other possible causes of your symptoms. How is  this treated? Early treatment may prevent CRPS from advancing past stage 1. There is not one treatment that works for everyone. Treatment options may include:  Medicines, which may include: ? NSAIDs, such as ibuprofen. ? Steroids. ? Blood pressure drugs. ? Antidepressants. ? Anti-seizure drugs. ? Pain relievers.  Exercise.  Occupational therapy (OT) and physical therapy (PT).  Biofeedback.  Mental health counseling.  Numbing injections.  Spinal surgery to implant a spinal cord stimulator or a pain  pump. Follow these instructions at home: Medicines  Take over-the-counter and prescription medicines only as told by your health care provider.  Do not drive or use heavy machinery while taking prescription pain medicine.  If you are taking prescription pain medicine, take actions to prevent or treat constipation. Your health care provider may recommend that you: ? Drink enough fluid to keep your urine pale yellow. ? Eat foods that are high in fiber, such as fresh fruits and vegetables, whole grains, and beans. ? Limit foods that are high in fat and processed sugars, such as fried or sweet foods. ? Take an over-the-counter or prescription medicine for constipation. General instructions  Do not use any products that contain nicotine or tobacco, such as cigarettes and e-cigarettes. If you need help quitting, ask your health care provider.  Maintain a healthy weight.  Return to your normal activities as told by your health care provider. Ask your health care provider what activities are safe for you.  Follow an exercise program as directed by your health care provider.  Keep all follow-up visits as told by your health care provider. This is important. Contact a health care provider if:  Your symptoms change.  Your symptoms get worse.  You develop anxiety or depression. Summary  Complex regional pain syndrome (CRPS) is a nerve disorder that causes long-term (chronic) pain, usually in a hand, arm, leg, or foot.  CRPS usually occurs after an injury or trauma, such as a fracture or sprain.  CRPS ranges from mild to severe. Early treatment may prevent CRPS from advancing to more severe stages. This information is not intended to replace advice given to you by your health care provider. Make sure you discuss any questions you have with your health care provider. Document Revised: 05/20/2017 Document Reviewed: 05/02/2017 Elsevier Patient Education  2020 [employer].

## 2019-07-25 LAB — CBC WITH DIFFERENTIAL/PLATELET
Basophils Absolute: 0 10*3/uL (ref 0.0–0.2)
Basos: 1 %
EOS (ABSOLUTE): 0.1 10*3/uL (ref 0.0–0.4)
Eos: 3 %
Hematocrit: 43.8 % (ref 34.0–46.6)
Hemoglobin: 14.9 g/dL (ref 11.1–15.9)
Immature Grans (Abs): 0 10*3/uL (ref 0.0–0.1)
Immature Granulocytes: 0 %
Lymphocytes Absolute: 1.2 10*3/uL (ref 0.7–3.1)
Lymphs: 39 %
MCH: 30.2 pg (ref 26.6–33.0)
MCHC: 34 g/dL (ref 31.5–35.7)
MCV: 89 fL (ref 79–97)
Monocytes Absolute: 0.2 10*3/uL (ref 0.1–0.9)
Monocytes: 8 %
Neutrophils Absolute: 1.4 10*3/uL (ref 1.4–7.0)
Neutrophils: 49 %
Platelets: 225 10*3/uL (ref 150–450)
RBC: 4.94 x10E6/uL (ref 3.77–5.28)
RDW: 12.8 % (ref 11.7–15.4)
WBC: 2.9 10*3/uL — ABNORMAL LOW (ref 3.4–10.8)

## 2019-07-25 LAB — BASIC METABOLIC PANEL
BUN/Creatinine Ratio: 12 (ref 9–23)
BUN: 13 mg/dL (ref 6–24)
CO2: 24 mmol/L (ref 20–29)
Calcium: 9.8 mg/dL (ref 8.7–10.2)
Chloride: 101 mmol/L (ref 96–106)
Creatinine, Ser: 1.13 mg/dL — ABNORMAL HIGH (ref 0.57–1.00)
GFR calc Af Amer: 63 mL/min/{1.73_m2} (ref >59)
GFR calc non Af Amer: 54 mL/min/{1.73_m2} — ABNORMAL LOW (ref >59)
Glucose: 108 mg/dL — ABNORMAL HIGH (ref 65–99)
Potassium: 3.4 mmol/L — ABNORMAL LOW (ref 3.5–5.2)
Sodium: 140 mmol/L (ref 134–144)

## 2019-07-25 LAB — HEMOGLOBIN A1C
Est. average glucose Bld gHb Est-mCnc: 134 mg/dL
Hgb A1c MFr Bld: 6.3 % — ABNORMAL HIGH (ref 4.8–5.6)

## 2019-07-26 ENCOUNTER — Telehealth: Payer: Self-pay | Admitting: Family Medicine

## 2019-07-26 NOTE — Telephone Encounter (Signed)
Spoke with the patient and discussed her lab results with her.  I informed her that her hemoglobin A1c was up from previous check but was not yet at the diabetic level.  I also let her know that I filled out the surgical clearance form and fax it to her orthopedic office

## 2019-08-06 ENCOUNTER — Ambulatory Visit: Payer: Self-pay | Admitting: Physician Assistant
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
## 2019-08-08 ENCOUNTER — Other Ambulatory Visit: Payer: Self-pay

## 2019-08-08 ENCOUNTER — Encounter (HOSPITAL_BASED_OUTPATIENT_CLINIC_OR_DEPARTMENT_OTHER): Payer: Self-pay | Admitting: Orthopedic Surgery

## 2019-08-11 ENCOUNTER — Other Ambulatory Visit (HOSPITAL_COMMUNITY): Payer: Medicaid Other

## 2019-08-13 ENCOUNTER — Other Ambulatory Visit (HOSPITAL_COMMUNITY): Admission: RE | Admit: 2019-08-13 | Payer: Medicaid Other | Source: Ambulatory Visit

## 2019-08-14 ENCOUNTER — Encounter (HOSPITAL_BASED_OUTPATIENT_CLINIC_OR_DEPARTMENT_OTHER): Payer: Self-pay | Admitting: Orthopedic Surgery

## 2019-08-14 ENCOUNTER — Other Ambulatory Visit (HOSPITAL_COMMUNITY)
Admission: RE | Admit: 2019-08-14 | Discharge: 2019-08-14 | Disposition: A | Discharge status: Home / Self Care | Payer: Medicaid Other | Source: Ambulatory Visit | Attending: Orthopedic Surgery | Admitting: Orthopedic Surgery

## 2019-08-14 DIAGNOSIS — Z01812 Encounter for preprocedural laboratory examination: Secondary | ICD-10-CM | POA: Insufficient documentation

## 2019-08-14 DIAGNOSIS — Z20822 Contact with and (suspected) exposure to covid-19: Secondary | ICD-10-CM | POA: Diagnosis not present

## 2019-08-14 LAB — SARS CORONAVIRUS 2 (TAT 6-24 HRS): SARS Coronavirus 2: NEGATIVE

## 2019-08-15 MED ORDER — CEFAZOLIN SODIUM-DEXTROSE 2-4 GM/100ML-% IV SOLN
INTRAVENOUS | Status: AC
  Filled 2019-08-15: qty 100

## 2019-08-15 MED ORDER — SCOPOLAMINE 1 MG/3DAYS TD PT72
MEDICATED_PATCH | TRANSDERMAL | Status: AC
  Filled 2019-08-15: qty 1

## 2019-08-15 MED ORDER — ACETAMINOPHEN 500 MG PO TABS
ORAL_TABLET | ORAL | Status: AC
  Filled 2019-08-15: qty 2

## 2019-08-16 NOTE — Anesthesia Preprocedure Evaluation (Addendum)
Anesthesia Evaluation  Patient identified by MRN, date of birth, ID band Patient awake  General Assessment Comment:Difficult IV placement  Reviewed: Allergy & Precautions, NPO status , Patient's Chart, lab work & pertinent test results  History of Anesthesia Complications (+) DIFFICULT IV STICK / SPECIAL LINE and history of anesthetic complications  Airway Mallampati: I  TM Distance: >3 FB Neck ROM: Full    Dental no notable dental hx. (+) Teeth Intact, Dental Advisory Given   Pulmonary neg pulmonary ROS,    Pulmonary exam normal breath sounds clear to auscultation       Cardiovascular hypertension, Pt. on medications Normal cardiovascular exam Rhythm:Regular Rate:Normal  Echo 2019: - Left ventricle: The cavity size was normal. Systolic function was  normal. The estimated ejection fraction was in the range of 60%  to 65%. Wall motion was normal; there were no regional wall  motion abnormalities. Left ventricular diastolic function  parameters were normal.  - Aortic valve: There was no regurgitation.  - Mitral valve: There was trivial regurgitation.  - Left atrium: The atrium was normal in size.  - Right ventricle: Systolic function was normal.  - Right atrium: The atrium was normal in size.  - Tricuspid valve: There was trivial regurgitation.  - Pulmonary arteries: Systolic pressure was within the normal  range.  - Inferior vena cava: The vessel was normal in size.  - Pericardium, extracardiac: There was no pericardial effusion.    Neuro/Psych  Headaches, negative psych ROS   GI/Hepatic Neg liver ROS, Chronic N/V, vomits on a daily basis   Endo/Other  Morbid obesity  Renal/GU negative Renal ROS  negative genitourinary   Musculoskeletal Chronic LBP and generalized pain- chronic narcotics Torn medial meniscus   Abdominal (+) + obese,   Peds negative pediatric ROS (+)  Hematology negative hematology  ROS (+)   Anesthesia Other Findings Chronic pain- takes 20mg  PO oxy per day for the last 2 years for headaches and generalized pain  Reproductive/Obstetrics negative OB ROS                          Anesthesia Physical Anesthesia Plan  ASA: III  Anesthesia Plan: General and Regional   Post-op Pain Management: GA combined w/ Regional for post-op pain   Induction: Intravenous  PONV Risk Score and Plan: 3 and Ondansetron, Dexamethasone, Midazolam and Treatment may vary due to age or medical condition  Airway Management Planned: Oral ETT  Additional Equipment: None  Intra-op Plan:   Post-operative Plan: Extubation in OR  Informed Consent: I have reviewed the patients History and Physical, chart, labs and discussed the procedure including the risks, benefits and alternatives for the proposed anesthesia with the patient or authorized representative who has indicated his/her understanding and acceptance.     Dental advisory given  Plan Discussed with: CRNA  Anesthesia Plan Comments:        Anesthesia Quick Evaluation

## 2019-08-17 ENCOUNTER — Ambulatory Visit (HOSPITAL_BASED_OUTPATIENT_CLINIC_OR_DEPARTMENT_OTHER): Payer: Medicaid Other | Admitting: Anesthesiology

## 2019-08-17 ENCOUNTER — Other Ambulatory Visit: Payer: Self-pay

## 2019-08-17 ENCOUNTER — Encounter (HOSPITAL_BASED_OUTPATIENT_CLINIC_OR_DEPARTMENT_OTHER)
Admission: RE | Disposition: A | Discharge status: Home / Self Care | Payer: Self-pay | Source: Ambulatory Visit | Attending: Orthopedic Surgery

## 2019-08-17 ENCOUNTER — Ambulatory Visit (HOSPITAL_BASED_OUTPATIENT_CLINIC_OR_DEPARTMENT_OTHER)
Admission: RE | Admit: 2019-08-17 | Discharge: 2019-08-17 | Disposition: A | Discharge status: Home / Self Care | Payer: Medicaid Other | Source: Ambulatory Visit | Attending: Orthopedic Surgery | Admitting: Orthopedic Surgery

## 2019-08-17 ENCOUNTER — Encounter (HOSPITAL_BASED_OUTPATIENT_CLINIC_OR_DEPARTMENT_OTHER): Payer: Self-pay | Admitting: Orthopedic Surgery

## 2019-08-17 DIAGNOSIS — S83242A Other tear of medial meniscus, current injury, left knee, initial encounter: Secondary | ICD-10-CM | POA: Diagnosis not present

## 2019-08-17 DIAGNOSIS — Z6836 Body mass index (BMI) 36.0-36.9, adult: Secondary | ICD-10-CM | POA: Diagnosis not present

## 2019-08-17 DIAGNOSIS — M94262 Chondromalacia, left knee: Secondary | ICD-10-CM | POA: Diagnosis not present

## 2019-08-17 DIAGNOSIS — M25562 Pain in left knee: Secondary | ICD-10-CM | POA: Diagnosis present

## 2019-08-17 DIAGNOSIS — G43909 Migraine, unspecified, not intractable, without status migrainosus: Secondary | ICD-10-CM | POA: Diagnosis not present

## 2019-08-17 DIAGNOSIS — I10 Essential (primary) hypertension: Secondary | ICD-10-CM | POA: Insufficient documentation

## 2019-08-17 DIAGNOSIS — S83282A Other tear of lateral meniscus, current injury, left knee, initial encounter: Secondary | ICD-10-CM | POA: Insufficient documentation

## 2019-08-17 HISTORY — PX: KNEE ARTHROSCOPY WITH LATERAL MENISECTOMY: SHX6193

## 2019-08-17 HISTORY — PX: CHONDROPLASTY: SHX5177

## 2019-08-17 HISTORY — PX: KNEE ARTHROSCOPY WITH MEDIAL MENISECTOMY: SHX5651

## 2019-08-17 SURGERY — ARTHROSCOPY, KNEE, WITH MEDIAL MENISCECTOMY
Anesthesia: Regional | Site: Knee | Laterality: Left

## 2019-08-17 MED ORDER — SODIUM CHLORIDE 0.9 % IV SOLN: INTRAVENOUS | Status: DC

## 2019-08-17 MED ORDER — MIDAZOLAM HCL 2 MG/2ML IJ SOLN
INTRAMUSCULAR | Status: AC
  Filled 2019-08-17: qty 2

## 2019-08-17 MED ORDER — PROMETHAZINE HCL 25 MG/ML IJ SOLN
INTRAMUSCULAR | Status: AC
  Filled 2019-08-17: qty 1

## 2019-08-17 MED ORDER — HYDROMORPHONE HCL 1 MG/ML IJ SOLN
INTRAMUSCULAR | Status: AC
  Filled 2019-08-17: qty 0.5

## 2019-08-17 MED ORDER — BUPIVACAINE HCL 0.25 % IJ SOLN
INTRAMUSCULAR | Status: DC | PRN
  Administered 2019-08-17: 10 mL

## 2019-08-17 MED ORDER — DEXAMETHASONE SODIUM PHOSPHATE 10 MG/ML IJ SOLN
INTRAMUSCULAR | Status: AC
  Filled 2019-08-17: qty 1

## 2019-08-17 MED ORDER — KETOROLAC TROMETHAMINE 30 MG/ML IJ SOLN
INTRAMUSCULAR | Status: AC
  Filled 2019-08-17: qty 1

## 2019-08-17 MED ORDER — METHYLPREDNISOLONE ACETATE 40 MG/ML IJ SUSP
INTRAMUSCULAR | Status: AC
  Filled 2019-08-17: qty 1

## 2019-08-17 MED ORDER — ACETAMINOPHEN 500 MG PO TABS
ORAL_TABLET | ORAL | Status: AC
  Filled 2019-08-17: qty 2

## 2019-08-17 MED ORDER — BUPIVACAINE HCL (PF) 0.5 % IJ SOLN
INTRAMUSCULAR | Status: AC
  Filled 2019-08-17: qty 30

## 2019-08-17 MED ORDER — HYDROMORPHONE HCL 1 MG/ML IJ SOLN
0.2500 mg | INTRAMUSCULAR | Status: DC | PRN
  Administered 2019-08-17: 0.25 mg via INTRAVENOUS
  Administered 2019-08-17: 0.5 mg via INTRAVENOUS

## 2019-08-17 MED ORDER — CEFAZOLIN SODIUM-DEXTROSE 2-4 GM/100ML-% IV SOLN
INTRAVENOUS | Status: AC
  Filled 2019-08-17: qty 100

## 2019-08-17 MED ORDER — CEFAZOLIN SODIUM-DEXTROSE 2-4 GM/100ML-% IV SOLN
2.0000 g | INTRAVENOUS | Status: AC
  Administered 2019-08-17: 2 g via INTRAVENOUS

## 2019-08-17 MED ORDER — OXYCODONE HCL 5 MG PO TABS
ORAL_TABLET | ORAL | Status: AC
  Filled 2019-08-17: qty 2

## 2019-08-17 MED ORDER — ROCURONIUM BROMIDE 100 MG/10ML IV SOLN
INTRAVENOUS | Status: DC | PRN
  Administered 2019-08-17: 50 mg via INTRAVENOUS

## 2019-08-17 MED ORDER — MEPERIDINE HCL 25 MG/ML IJ SOLN: 6.2500 mg | INTRAMUSCULAR | Status: DC | PRN

## 2019-08-17 MED ORDER — FENTANYL CITRATE (PF) 100 MCG/2ML IJ SOLN
INTRAMUSCULAR | Status: AC
  Filled 2019-08-17: qty 2

## 2019-08-17 MED ORDER — DIPHENHYDRAMINE HCL 50 MG/ML IJ SOLN
INTRAMUSCULAR | Status: DC | PRN
  Administered 2019-08-17: 12.5 mg via INTRAVENOUS

## 2019-08-17 MED ORDER — FENTANYL CITRATE (PF) 100 MCG/2ML IJ SOLN
50.0000 ug | INTRAMUSCULAR | Status: AC | PRN
  Administered 2019-08-17 (×3): 50 ug via INTRAVENOUS

## 2019-08-17 MED ORDER — EPINEPHRINE PF 1 MG/ML IJ SOLN
INTRAMUSCULAR | Status: AC
  Filled 2019-08-17: qty 2

## 2019-08-17 MED ORDER — OXYCODONE HCL 5 MG PO TABS: 5.0000 mg | ORAL_TABLET | Freq: Once | ORAL | Status: DC | PRN

## 2019-08-17 MED ORDER — HYDROMORPHONE HCL 1 MG/ML IJ SOLN
INTRAMUSCULAR | Status: DC | PRN
  Administered 2019-08-17: .5 mg via INTRAVENOUS

## 2019-08-17 MED ORDER — PROPOFOL 10 MG/ML IV BOLUS
INTRAVENOUS | Status: AC
  Filled 2019-08-17: qty 20

## 2019-08-17 MED ORDER — ACETAMINOPHEN 500 MG PO TABS
1000.0000 mg | ORAL_TABLET | Freq: Once | ORAL | Status: AC
  Administered 2019-08-17: 1000 mg via ORAL

## 2019-08-17 MED ORDER — DEXAMETHASONE SODIUM PHOSPHATE 10 MG/ML IJ SOLN
INTRAMUSCULAR | Status: DC | PRN
  Administered 2019-08-17: 10 mg via INTRAVENOUS

## 2019-08-17 MED ORDER — PROPOFOL 10 MG/ML IV BOLUS
INTRAVENOUS | Status: DC | PRN
  Administered 2019-08-17: 150 mg via INTRAVENOUS

## 2019-08-17 MED ORDER — LACTATED RINGERS IV SOLN: INTRAVENOUS | Status: DC

## 2019-08-17 MED ORDER — METHYLPREDNISOLONE ACETATE 80 MG/ML IJ SUSP
INTRAMUSCULAR | Status: DC | PRN
  Administered 2019-08-17: 80 mg

## 2019-08-17 MED ORDER — ONDANSETRON HCL 4 MG/2ML IJ SOLN
INTRAMUSCULAR | Status: DC | PRN
  Administered 2019-08-17 (×2): 4 mg via INTRAVENOUS

## 2019-08-17 MED ORDER — CHLORHEXIDINE GLUCONATE 4 % EX LIQD: 60.0000 mL | Freq: Once | CUTANEOUS | Status: DC

## 2019-08-17 MED ORDER — DIPHENHYDRAMINE HCL 50 MG/ML IJ SOLN
INTRAMUSCULAR | Status: AC
  Filled 2019-08-17: qty 1

## 2019-08-17 MED ORDER — LACTATED RINGERS IV SOLN: INTRAVENOUS | Status: DC | PRN

## 2019-08-17 MED ORDER — SODIUM CHLORIDE 0.9 % IR SOLN
Status: DC | PRN
  Administered 2019-08-17: 09:00:00 2 mL

## 2019-08-17 MED ORDER — HYDROMORPHONE HCL 1 MG/ML IJ SOLN: 0.2500 mg | INTRAMUSCULAR | Status: DC | PRN

## 2019-08-17 MED ORDER — SUGAMMADEX SODIUM 500 MG/5ML IV SOLN
INTRAVENOUS | Status: DC | PRN
  Administered 2019-08-17: 300 mg via INTRAVENOUS

## 2019-08-17 MED ORDER — PROMETHAZINE HCL 25 MG/ML IJ SOLN
6.2500 mg | INTRAMUSCULAR | Status: DC | PRN
  Administered 2019-08-17: 6.25 mg via INTRAVENOUS

## 2019-08-17 MED ORDER — DEXAMETHASONE SODIUM PHOSPHATE 10 MG/ML IJ SOLN
INTRAMUSCULAR | Status: DC | PRN
  Administered 2019-08-17: 10 mg

## 2019-08-17 MED ORDER — ROPIVACAINE HCL 5 MG/ML IJ SOLN
INTRAMUSCULAR | Status: DC | PRN
  Administered 2019-08-17: 30 mL via PERINEURAL

## 2019-08-17 MED ORDER — OXYCODONE HCL 5 MG/5ML PO SOLN: 5.0000 mg | Freq: Once | ORAL | Status: DC | PRN

## 2019-08-17 MED ORDER — MIDAZOLAM HCL 2 MG/2ML IJ SOLN
1.0000 mg | INTRAMUSCULAR | Status: DC | PRN
  Administered 2019-08-17: 2 mg via INTRAVENOUS

## 2019-08-17 MED ORDER — ONDANSETRON HCL 4 MG/2ML IJ SOLN
INTRAMUSCULAR | Status: AC
  Filled 2019-08-17: qty 4

## 2019-08-17 MED ORDER — LIDOCAINE 2% (20 MG/ML) 5 ML SYRINGE
INTRAMUSCULAR | Status: AC
  Filled 2019-08-17: qty 5

## 2019-08-17 MED ORDER — MIDAZOLAM HCL 5 MG/5ML IJ SOLN
INTRAMUSCULAR | Status: DC | PRN
  Administered 2019-08-17: 2 mg via INTRAVENOUS

## 2019-08-17 MED ORDER — BUPIVACAINE HCL (PF) 0.25 % IJ SOLN
INTRAMUSCULAR | Status: AC
  Filled 2019-08-17: qty 30

## 2019-08-17 MED ORDER — SUGAMMADEX SODIUM 500 MG/5ML IV SOLN
INTRAVENOUS | Status: AC
  Filled 2019-08-17: qty 5

## 2019-08-17 MED ORDER — METHYLPREDNISOLONE ACETATE 80 MG/ML IJ SUSP
INTRAMUSCULAR | Status: AC
  Filled 2019-08-17: qty 1

## 2019-08-17 MED ORDER — OXYCODONE HCL 5 MG PO TABS
10.0000 mg | ORAL_TABLET | Freq: Once | ORAL | Status: AC
  Administered 2019-08-17: 10 mg via ORAL

## 2019-08-17 SURGICAL SUPPLY — 39 items
BANDAGE ESMARK 6X9 LF (GAUZE/BANDAGES/DRESSINGS) IMPLANT
BNDG CMPR 9X6 STRL LF SNTH (GAUZE/BANDAGES/DRESSINGS)
BNDG ELASTIC 6X5.8 VLCR STR LF (GAUZE/BANDAGES/DRESSINGS) ×4 IMPLANT
BNDG ESMARK 6X9 LF (GAUZE/BANDAGES/DRESSINGS)
BNDG GAUZE ELAST 4 BULKY (GAUZE/BANDAGES/DRESSINGS) ×4 IMPLANT
CATH KIT MULIT LUMEN 3X BORE (CATHETERS) ×4 IMPLANT
COVER WAND RF STERILE (DRAPES) IMPLANT
DISSECTOR 3.5MM X 13CM CVD (MISCELLANEOUS) IMPLANT
DISSECTOR 4.0MM X 13CM (MISCELLANEOUS) ×4 IMPLANT
DRAPE ARTHROSCOPY W/POUCH 90 (DRAPES) ×4 IMPLANT
DRSG EMULSION OIL 3X3 NADH (GAUZE/BANDAGES/DRESSINGS) ×4 IMPLANT
DURAPREP 26ML APPLICATOR (WOUND CARE) ×4 IMPLANT
EXCALIBUR 3.8MM X 13CM (MISCELLANEOUS) ×4 IMPLANT
GAUZE SPONGE 4X4 12PLY STRL (GAUZE/BANDAGES/DRESSINGS) ×4 IMPLANT
GLOVE BIO SURGEON STRL SZ7.5 (GLOVE) ×4 IMPLANT
GLOVE BIOGEL PI IND STRL 7.0 (GLOVE) ×2 IMPLANT
GLOVE BIOGEL PI IND STRL 8 (GLOVE) ×6 IMPLANT
GLOVE BIOGEL PI INDICATOR 7.0 (GLOVE) ×2
GLOVE BIOGEL PI INDICATOR 8 (GLOVE) ×6
GLOVE SURG ORTHO 8.0 STRL STRW (GLOVE) ×4 IMPLANT
GLOVE SURG SS PI 7.0 STRL IVOR (GLOVE) ×4 IMPLANT
GOWN STRL REUS W/ TWL LRG LVL3 (GOWN DISPOSABLE) ×2 IMPLANT
GOWN STRL REUS W/ TWL XL LVL3 (GOWN DISPOSABLE) ×2 IMPLANT
GOWN STRL REUS W/TWL LRG LVL3 (GOWN DISPOSABLE) ×4
GOWN STRL REUS W/TWL XL LVL3 (GOWN DISPOSABLE) ×10 IMPLANT
HOLDER KNEE FOAM BLUE (MISCELLANEOUS) ×4 IMPLANT
KNEE WRAP E Z 3 GEL PACK (MISCELLANEOUS) ×4 IMPLANT
MANIFOLD NEPTUNE II (INSTRUMENTS) ×4 IMPLANT
NDL SAFETY ECLIPSE 18X1.5 (NEEDLE) ×2 IMPLANT
NEEDLE HYPO 18GX1.5 SHARP (NEEDLE) ×3
PACK ARTHROSCOPY DSU (CUSTOM PROCEDURE TRAY) ×4 IMPLANT
PACK BASIN DAY SURGERY FS (CUSTOM PROCEDURE TRAY) ×4 IMPLANT
PORT APPOLLO RF 90DEGREE MULTI (SURGICAL WAND) IMPLANT
PROBE BIPOLAR ARTHRO 85MM 30D (MISCELLANEOUS) IMPLANT
SUT ETHILON 4 0 PS 2 18 (SUTURE) ×4 IMPLANT
SYR 5ML LL (SYRINGE) ×4 IMPLANT
TOWEL GREEN STERILE FF (TOWEL DISPOSABLE) ×4 IMPLANT
TUBING ARTHROSCOPY IRRIG 16FT (MISCELLANEOUS) ×4 IMPLANT
WATER STERILE IRR 1000ML POUR (IV SOLUTION) ×4 IMPLANT

## 2019-08-17 NOTE — Interval H&P Note (Signed)
History and Physical Interval Note:  08/17/2019 7:35 AM  Kirsten Walsh  has presented today for surgery, with the diagnosis of LEFT KNEE MEDIAL MENISCUS TEAR.  The various methods of treatment have been discussed with the patient and family. After consideration of risks, benefits and other options for treatment, the patient has consented to  Procedure(s): KNEE ARTHROSCOPY WITH MEDIAL MENISECTOMY (Left) as a surgical intervention.  The patient's history has been reviewed, patient examined, no change in status, stable for surgery.  I have reviewed the patient's chart and labs.  Questions were answered to the patient's satisfaction.     Thera Flake

## 2019-08-17 NOTE — Anesthesia Procedure Notes (Addendum)
Central Venous Catheter Insertion Performed by: Lannie Fields, DO, anesthesiologist Start/End2/26/2021 7:45 AM, 08/17/2019 8:00 AM Patient location: OR. Preanesthetic checklist: patient identified, IV checked, site marked, risks and benefits discussed, surgical consent, monitors and equipment checked, pre-op evaluation, timeout performed and anesthesia consent Lidocaine 1% used for infiltration and patient sedated Hand hygiene performed  and maximum sterile barriers used  Catheter size: 8 Fr Total catheter length 16. Central line was placed.Double lumen Procedure performed using ultrasound guided technique. Ultrasound Notes:image(s) printed for medical record Attempts: 1 Following insertion, dressing applied and line sutured. Post procedure assessment: blood return through all ports  Patient tolerated the procedure well with no immediate complications.

## 2019-08-17 NOTE — Addendum Note (Signed)
Addendum  created 08/17/19 1104 by Lannie Fields, DO   Order list changed

## 2019-08-17 NOTE — Anesthesia Procedure Notes (Signed)
Procedure Name: Intubation Date/Time: 08/17/2019 8:17 AM Performed by: Marny Lowenstein, CRNA Pre-anesthesia Checklist: Patient identified, Emergency Drugs available, Suction available and Patient being monitored Patient Re-evaluated:Patient Re-evaluated prior to induction Oxygen Delivery Method: Circle system utilized Preoxygenation: Pre-oxygenation with 100% oxygen Induction Type: IV induction Ventilation: Mask ventilation without difficulty Laryngoscope Size: Glidescope and 3 Grade View: Grade I Tube type: Oral Tube size: 7.0 mm Number of attempts: 1 Airway Equipment and Method: Patient positioned with wedge pillow and Stylet Placement Confirmation: ETT inserted through vocal cords under direct vision,  positive ETCO2 and breath sounds checked- equal and bilateral Secured at: 21 cm Tube secured with: Tape Dental Injury: Teeth and Oropharynx as per pre-operative assessment  Comments: Pt with h/o cervical neck and back pain; pain shooting down arms bilaterally; pt stated comfort with neck and back prior to induction. Neck remained neutral throughout induction

## 2019-08-17 NOTE — Anesthesia Procedure Notes (Signed)
Anesthesia Regional Block: Adductor canal block   Pre-Anesthetic Checklist: ,, timeout performed, Correct Patient, Correct Site, Correct Laterality, Correct Procedure, Correct Position, site marked, Risks and benefits discussed,  Surgical consent,  Pre-op evaluation,  At surgeon's request and post-op pain management  Laterality: Left  Prep: Maximum Sterile Barrier Precautions used, chloraprep       Needles:  Injection technique: Single-shot  Needle Type: Echogenic Stimulator Needle     Needle Length: 9cm  Needle Gauge: 22     Additional Needles:   Procedures:,,,, ultrasound used (permanent image in chart),,,,  Narrative:  Start time: 08/17/2019 7:25 AM End time: 08/17/2019 7:30 AM Injection made incrementally with aspirations every 5 mL.  Performed by: Personally  Anesthesiologist: Lannie Fields, DO  Additional Notes: Monitors applied. No increased pain on injection. No increased resistance to injection. Injection made in 5cc increments. Good needle visualization. Patient tolerated procedure well.

## 2019-08-17 NOTE — Anesthesia Postprocedure Evaluation (Signed)
Anesthesia Post Note  Patient: NANDINI BOGDANSKI  Procedure(s) Performed: KNEE ARTHROSCOPY WITH MEDIAL MENISECTOMY (Left Knee) KNEE ARTHROSCOPY WITH LATERAL MENISECTOMY (Knee) CHONDROPLASTY (Left Knee)     Patient location during evaluation: PACU Anesthesia Type: Regional and General Level of consciousness: awake and alert, oriented and patient cooperative Pain management: pain level controlled Vital Signs Assessment: post-procedure vital signs reviewed and stable Respiratory status: spontaneous breathing, nonlabored ventilation and respiratory function stable Cardiovascular status: blood pressure returned to baseline and stable Postop Assessment: no apparent nausea or vomiting Anesthetic complications: no Comments: Unable to place PIV, RIJ central line placed for case    Last Vitals:  Vitals:   08/17/19 1000 08/17/19 1015  BP: 128/88 132/83  Pulse: 77 85  Resp: 12 (!) 22  Temp:    SpO2: 100% 100%    Last Pain:  Vitals:   08/17/19 1015  PainSc: 6                  Lannie Fields

## 2019-08-17 NOTE — Discharge Instructions (Signed)
No additional tylenol until after 12:45 today.   Diet: As you were doing prior to hospitalization   Activity: Increase activity slowly as tolerated  No lifting or driving for 48 hours   Shower: May shower without a dressing on post op day #3, NO SOAKING in tub   Dressing: You may change your dressing on post op day #3.  Then change the dressing daily with sterile 4"x4"s gauze dressing or band aids  Weight Bearing: weight bearing as taught in physical therapy. Use a walker or  Crutches as needed   To prevent constipation: you may use a stool softener such as -  Colace ( over the counter) 100 mg by mouth twice a day  Drink plenty of fluids ( prune juice may be helpful) and high fiber foods  Miralax ( over the counter) for constipation as needed.   Precautions: If you experience chest pain or shortness of breath - call 911 immediately For transfer to the hospital emergency department!!  If you develop a fever greater that 101 F, purulent drainage from wound, increased redness or drainage from wound, or calf pain -- Call the office   Follow- Up Appointment: Please call for an appointment to be seen in 1 week or as previously scheduled  [facility] - [phone]    Post Anesthesia Home Care Instructions  Activity: Get plenty of rest for the remainder of the day. A responsible individual must stay with you for 24 hours following the procedure.  For the next 24 hours, DO NOT: -Drive a car -Paediatric nurse -Drink alcoholic beverages -Take any medication unless instructed by your physician -Make any legal decisions or sign important papers.  Meals: Start with liquid foods such as gelatin or soup. Progress to regular foods as tolerated. Avoid greasy, spicy, heavy foods. If nausea and/or vomiting occur, drink only clear liquids until the nausea and/or vomiting subsides. Call your physician if vomiting continues.  Special Instructions/Symptoms: Your throat may feel dry or sore from  the anesthesia or the breathing tube placed in your throat during surgery. If this causes discomfort, gargle with warm salt water. The discomfort should disappear within 24 hours.  If you had a scopolamine patch placed behind your ear for the management of post- operative nausea and/or vomiting:  1. The medication in the patch is effective for 72 hours, after which it should be removed.  Wrap patch in a tissue and discard in the trash. Wash hands thoroughly with soap and water. 2. You may remove the patch earlier than 72 hours if you experience unpleasant side effects which may include dry mouth, dizziness or visual disturbances. 3. Avoid touching the patch. Wash your hands with soap and water after contact with the patch.      Regional Anesthesia Blocks  1. Numbness or the inability to move the "blocked" extremity may last from 3-48 hours after placement. The length of time depends on the medication injected and your individual response to the medication. If the numbness is not going away after 48 hours, call your surgeon.  2. The extremity that is blocked will need to be protected until the numbness is gone and the  Strength has returned. Because you cannot feel it, you will need to take extra care to avoid injury. Because it may be weak, you may have difficulty moving it or using it. You may not know what position it is in without looking at it while the block is in effect.  3. For blocks in the legs  and feet, returning to weight bearing and walking needs to be done carefully. You will need to wait until the numbness is entirely gone and the strength has returned. You should be able to move your leg and foot normally before you try and bear weight or walk. You will need someone to be with you when you first try to ensure you do not fall and possibly risk injury.  4. Bruising and tenderness at the needle site are common side effects and will resolve in a few days.  5. Persistent numbness or new  problems with movement should be communicated to the surgeon or the [facility] [phone] [facility] [phone]).

## 2019-08-17 NOTE — Op Note (Signed)
NAME: NATALIYA, GRAIG MEDICAL RECORD [id number] ACCOUNT [id number] DATE OF BIRTH:[birth year] FACILITY: [facility] LOCATION: [facility] PHYSICIAN:W. Tayvian Holycross JR., MD  OPERATIVE REPORT  DATE OF PROCEDURE:  08/17/2019  PREOPERATIVE DIAGNOSES: 1.  Torn medial and lateral menisci, left knee. 2.  Grade III chondromalacia patellofemoral joint, medial compartment.  POSTOPERATIVE DIAGNOSES: 1.  Torn medial and lateral menisci, left knee. 2.  Grade III chondromalacia patellofemoral joint, medial compartment.  OPERATIONS:   1.  Partial medial and lateral meniscectomies. 2.  Debridement chondroplasty of patellofemoral joint, medial compartment.  SURGEON:  Marcie Mowers, MD  ANESTHESIA:  General anesthetic with an adductor block.  DESCRIPTION OF PROCEDURE:  After general anesthetic and an adductor block, leg holder, no tourniquet, inferomedial and inferolateral portals were made.  Inspection of the patellofemoral area showed mild chondromalacia of the patella with more advanced  changes of the trochlear groove, grade II on the patella, grade II and III and probably greater than 50% of the articular surface relative to the trochlea area. The lateral compartment relative to the articular cartilage was normal.  There was a  fraying-type tear of the lateral meniscus requiring resection of about 10-15% of the meniscus substance mainly along the anterior middle portions of the meniscus.  Medial compartment showed the most significant pathology relative to a complex tear of the medial meniscus.  We resected most of the posterior horn back toward the level nearly of the capsule, probably about a 40% meniscectomy medially.  There were  generalized grade III changes on both sides of the joint relative to the tibia and femur, estimated at about 25-30% of the posterior aspect of the tibial plateau and correspondingly about a 50% lesion of the weightbearing condyle of the femur.  No grade  IV  changes appreciated.  Again, chondroplasty was carried out in patellofemoral area as well as the medial compartment, medial and lateral meniscectomies.  Knee drained free of fluid.  Portals were closed with nylon.  Knee infiltrated with about 15 mL  0.25% Marcaine, mixed with 80 mg Depo-Medrol.  VN/NUANCE  D:08/17/2019 T:08/17/2019 JOB:[id number]

## 2019-08-17 NOTE — Transfer of Care (Signed)
Immediate Anesthesia Transfer of Care Note  Patient: Kirsten Walsh  Procedure(s) Performed: KNEE ARTHROSCOPY WITH MEDIAL MENISECTOMY (Left Knee) KNEE ARTHROSCOPY WITH LATERAL MENISECTOMY (Knee) CHONDROPLASTY (Left Knee)  Patient Location: PACU  Anesthesia Type:General and Regional  Level of Consciousness: awake, alert  and oriented  Airway & Oxygen Therapy: Patient Spontanous Breathing and Patient connected to face mask oxygen  Post-op Assessment: Report given to RN and Post -op Vital signs reviewed and stable  Post vital signs: Reviewed and stable  Last Vitals:  Vitals Value Taken Time  BP 120/86 08/17/19 0900  Temp    Pulse 91 08/17/19 0910  Resp 18 08/17/19 0911  SpO2 100 % 08/17/19 0910  Vitals shown include unvalidated device data.  Last Pain:  Vitals:   08/17/19 8307  PainSc: 8          Complications: No apparent anesthesia complications

## 2019-08-17 NOTE — Brief Op Note (Signed)
08/17/2019  9:38 AM  PATIENT:  Kirsten Walsh  59 y.o. female  PRE-OPERATIVE DIAGNOSIS:  LEFT KNEE MEDIAL MENISCUS TEAR  POST-OPERATIVE DIAGNOSIS:  LEFT KNEE MEDIAL MENISCUS TEAR  PROCEDURE:  Procedure(s): KNEE ARTHROSCOPY WITH MEDIAL MENISECTOMY (Left) KNEE ARTHROSCOPY WITH LATERAL MENISECTOMY CHONDROPLASTY (Left)  SURGEON:  Surgeon(s) and Role:    Frederico Hamman, MD - Primary  PHYSICIAN ASSISTANT:   ASSISTANTS: none   ANESTHESIA:   general  EBL:  5 mL   BLOOD ADMINISTERED:none  DRAINS: none   LOCAL MEDICATIONS USED:  SPECIMEN:  No Specimen  DISPOSITION OF SPECIMEN:  N/A  COUNTS:  YES  TOURNIQUET:  * No tourniquets in log *  DICTATION: .Other Dictation: Dictation Number unknown  PLAN OF CARE: Discharge to home after PACU  PATIENT DISPOSITION:  PACU - hemodynamically stable.   Delay start of Pharmacological VTE agent (>24hrs) due to surgical blood loss or risk of bleeding: not applicable

## 2019-08-17 NOTE — Progress Notes (Signed)
Assisted Dr. Finucane with left, ultrasound guided, adductor canal block. Side rails up, monitors on throughout procedure. See vital signs in flow sheet. Tolerated Procedure well. °

## 2019-08-20 ENCOUNTER — Ambulatory Visit: Payer: Medicaid Other | Admitting: Physical Therapy

## 2019-08-20 ENCOUNTER — Emergency Department (HOSPITAL_COMMUNITY)
Admission: EM | Admit: 2019-08-20 | Discharge: 2019-08-20 | Disposition: A | Discharge status: Home / Self Care | Payer: Medicaid Other | Attending: Emergency Medicine | Admitting: Emergency Medicine

## 2019-08-20 DIAGNOSIS — Z5321 Procedure and treatment not carried out due to patient leaving prior to being seen by health care provider: Secondary | ICD-10-CM | POA: Diagnosis not present

## 2019-08-20 DIAGNOSIS — R112 Nausea with vomiting, unspecified: Secondary | ICD-10-CM | POA: Diagnosis present

## 2019-08-20 LAB — CBC
HCT: 47.6 % — ABNORMAL HIGH (ref 36.0–46.0)
Hemoglobin: 15.2 g/dL — ABNORMAL HIGH (ref 12.0–15.0)
MCH: 29.6 pg (ref 26.0–34.0)
MCHC: 31.9 g/dL (ref 30.0–36.0)
MCV: 92.6 fL (ref 80.0–100.0)
Platelets: 230 10*3/uL (ref 150–400)
RBC: 5.14 MIL/uL — ABNORMAL HIGH (ref 3.87–5.11)
RDW: 13.2 % (ref 11.5–15.5)
WBC: 3.8 10*3/uL — ABNORMAL LOW (ref 4.0–10.5)
nRBC: 0 % (ref 0.0–0.2)

## 2019-08-20 LAB — LIPASE, BLOOD: Lipase: 29 U/L (ref 11–51)

## 2019-08-20 LAB — COMPREHENSIVE METABOLIC PANEL
ALT: 38 U/L (ref 0–44)
AST: 34 U/L (ref 15–41)
Albumin: 3.8 g/dL (ref 3.5–5.0)
Alkaline Phosphatase: 106 U/L (ref 38–126)
Anion gap: 11 (ref 5–15)
BUN: 12 mg/dL (ref 6–20)
CO2: 22 mmol/L (ref 22–32)
Calcium: 9 mg/dL (ref 8.9–10.3)
Chloride: 104 mmol/L (ref 98–111)
Creatinine, Ser: 1.18 mg/dL — ABNORMAL HIGH (ref 0.44–1.00)
GFR calc Af Amer: 60 mL/min — ABNORMAL LOW (ref >60)
GFR calc non Af Amer: 52 mL/min — ABNORMAL LOW (ref >60)
Glucose, Bld: 147 mg/dL — ABNORMAL HIGH (ref 70–99)
Potassium: 2.9 mmol/L — ABNORMAL LOW (ref 3.5–5.1)
Sodium: 137 mmol/L (ref 135–145)
Total Bilirubin: 0.6 mg/dL (ref 0.3–1.2)
Total Protein: 7.2 g/dL (ref 6.5–8.1)

## 2019-08-20 LAB — I-STAT BETA HCG BLOOD, ED (MC, WL, AP ONLY): I-stat hCG, quantitative: <5 m[IU]/mL (ref <5)

## 2019-08-20 MED ORDER — SODIUM CHLORIDE 0.9% FLUSH: 3.0000 mL | Freq: Once | INTRAVENOUS | Status: DC

## 2019-08-20 NOTE — ED Triage Notes (Signed)
Pt reports having knee surgery on Friday and since waking from surgery has had constant n/v/d and unable to keep any food down. Pt denies any abd pain currently.

## 2019-08-22 ENCOUNTER — Other Ambulatory Visit: Payer: Self-pay

## 2019-08-22 ENCOUNTER — Ambulatory Visit: Payer: Medicaid Other | Attending: Orthopedic Surgery | Admitting: Physical Therapy

## 2019-08-22 ENCOUNTER — Encounter: Payer: Self-pay | Admitting: Physical Therapy

## 2019-08-22 DIAGNOSIS — M25562 Pain in left knee: Secondary | ICD-10-CM | POA: Insufficient documentation

## 2019-08-22 DIAGNOSIS — R600 Localized edema: Secondary | ICD-10-CM | POA: Diagnosis present

## 2019-08-22 DIAGNOSIS — G8929 Other chronic pain: Secondary | ICD-10-CM | POA: Insufficient documentation

## 2019-08-22 DIAGNOSIS — R2689 Other abnormalities of gait and mobility: Secondary | ICD-10-CM | POA: Insufficient documentation

## 2019-08-22 DIAGNOSIS — M6281 Muscle weakness (generalized): Secondary | ICD-10-CM | POA: Insufficient documentation

## 2019-08-22 NOTE — Therapy (Signed)
[place], [place], [address] Phone: [phone]   Fax:  [phone]  Physical Therapy Evaluation  Patient Details  Name: Kirsten Walsh MRN: [id number] Date of Birth: 1960-12-30 Referring Provider (PT): Earlie Server, MD    Encounter Date: 08/22/2019  PT End of Session - 08/22/19 1324    Visit Number  1    Number of Visits  17    Date for PT Re-Evaluation  10/17/19    Authorization Type  MCD - submitted on 08/22/2019    PT Start Time  1325    PT Stop Time  1410    PT Time Calculation (min)  45 min       Past Medical History:  Diagnosis Date  . Chest pain   . Chronic lower back pain    "right side to mid back" (07/06/2017)  . Family history of adverse reaction to anesthesia    "daughter did; not sure happened"  . Gall stones   . History of anaphylaxis 06/26/2014  . Hypertension   . Kidney stone ~ 2006  . Migraine    "qd" (07/06/2017)  . Syncope and collapse 07/06/2017    sitting on the commode; developed nausea and passed out; hit head on shower and suffered a laceration; woke up in "a pool of blood"    Past Surgical History:  Procedure Laterality Date  . CHONDROPLASTY Left 08/17/2019   Procedure: CHONDROPLASTY;  Surgeon: Earlie Server, MD;  Location: [place];  Service: Orthopedics;  Laterality: Left;  . CYSTOSCOPY W/ STONE MANIPULATION  ~ 2006  . INTRAUTERINE DEVICE INSERTION     "initially put in in 04/2002; changed prn" (02/14/2013)  . KNEE ARTHROSCOPY WITH LATERAL MENISECTOMY  08/17/2019   Procedure: KNEE ARTHROSCOPY WITH LATERAL MENISECTOMY;  Surgeon: Earlie Server, MD;  Location: [facility];  Service: Orthopedics;;  . KNEE ARTHROSCOPY WITH MEDIAL MENISECTOMY Left 08/17/2019   Procedure: KNEE ARTHROSCOPY WITH MEDIAL MENISECTOMY;  Surgeon: Earlie Server, MD;  Location: [facility];  Service: Orthopedics;  Laterality: Left;  . LAPAROSCOPIC  CHOLECYSTECTOMY  ~ 2004    There were no vitals filed for this visit.   Subjective Assessment - 08/22/19 1334    Subjective  pt is a 59 y.o F with CC of L knee pain following lateral/ medial menisectomy on 08/15/2019. She reports having continued N/V/D and went to the ED on 08/20/2019. She continues to have pain inthe knee which limits activity/ gait.    Limitations  Standing;Walking;Lifting;Sitting    How long can you sit comfortably?  15-20 min    How long can you stand comfortably?  5 - 10 min    How long can you walk comfortably?  5-10 min    Diagnostic tests  nothing recent    Patient Stated Goals  to decrease pain    Currently in Pain?  Yes    Pain Score  8    last took pain meds at 12pm   Pain Location  Knee    Pain Orientation  Left    Pain Descriptors / Indicators  Sore;Throbbing    Pain Type  Surgical pain    Pain Onset  1 to 4 weeks ago    Pain Frequency  Constant    Aggravating Factors   standing/ walking, laying down    Pain Relieving Factors  medication, ice,    Effect of Pain on Daily Activities  limited standing/ walking,  [facility] PT Assessment - 08/22/19 1325      Assessment   Medical Diagnosis  L knee menisectomy     Referring Provider (PT)  Frederico Hamman, MD     Onset Date/Surgical Date  08/15/19    Hand Dominance  Right    Next MD Visit  unsure    Prior Therapy  yes   for low back     Precautions   Precautions  None      Restrictions   Weight Bearing Restrictions  No      Balance Screen   Has the patient fallen in the past 6 months  Yes    How many times?  2    Has the patient had a decrease in activity level because of a fear of falling?   No    Is the patient reluctant to leave their home because of a fear of falling?   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    Vocation Requirements  prolonged sitting/ standing, walking      Cognition   Overall Cognitive Status  Within Functional Limits for tasks assessed      Observation/Other Assessments   Lower Extremity Functional Scale   17/80      Posture/Postural Control   Posture/Postural Control  Postural limitations    Postural Limitations  Rounded Shoulders;Forward head      ROM / Strength   AROM / PROM / Strength  AROM;PROM;Strength      AROM   AROM Assessment Site  Knee    Right/Left Knee  Right;Left    Right Knee Flexion  125    Left Knee Extension  -5    Left Knee Flexion  75      PROM   PROM Assessment Site  Knee    Right/Left Knee  Left    Left Knee Extension  -3    Left Knee Flexion  85      Strength   Overall Strength  Unable to assess;Due to pain    Strength Assessment Site  Hip;Knee    Right/Left Hip  Right;Left    Right/Left Knee  Right;Left    Right Knee Flexion  4+/5    Right Knee Extension  4+/5      Palpation   Palpation comment  TTP along the medial/ lateral joint line, and per-patellar      Ambulation/Gait   Ambulation/Gait  Yes    Gait Pattern  Step-to pattern;Decreased stride length;Decreased stance time - left;Decreased step length - right;Antalgic;Trendelenburg;Trunk flexed                Objective measurements completed on examination: See above findings.              PT Education - 08/22/19 1327    Education  Details  evaluation findings, POC, goals, HEP with proper form, efficient gait pattern    Person(s) Educated  Patient    Methods  Explanation;Verbal cues;Handout    Comprehension   Verbalized understanding;Verbal cues required       PT Short Term Goals - 08/22/19 1415      PT SHORT TERM GOAL #1   Title  pt to be I with inital HEP    Baseline  no previous HEP    Time  3    Period  Weeks    Status  New    Target Date  09/12/19      PT SHORT TERM GOAL #2   Title  pt to verbalize and demonstrate techniques to reduce pain and inflammation via RICE and HEP    Baseline  used ice intermittently with pain    Time  3    Period  Weeks    Status  New    Target Date  09/12/19        PT Long Term Goals - 08/22/19 1417      PT LONG TERM GOAL #1   Title  improve L knee ROM to >/= 2 - 120 with </= 2/10 paain for functional ROM required for ADLs    Baseline  5 - 75 with 7/10 pain    Time  8    Period  Weeks    Status  New    Target Date  10/17/19      PT LONG TERM GOAL #2   Title  increase LLE gross strength to >/= 4/5 to promote knee stability with walking/ standing    Baseline  unable to test LLE strength secondary to irritability/ severity    Time  8    Period  Weeks    Status  New    Target Date  10/17/19      PT LONG TERM GOAL #3   Title  pt to be able to sit, stand or walk for >/= 60 min with </ 2/10 pain for functional endurance required for ADLs and work related tasks    Baseline  sit for 15-20 min, and stand/walk 5-10 min    Time  8    Period  Weeks    Status  New    Target Date  10/17/19      PT LONG TERM GOAL #4   Title  increase LEFS score to >/= 50/80 to demo improvement in function    Baseline  17/80 at eval    Time  8    Period  Weeks    Status  New    Target Date  10/17/19      PT LONG TERM GOAL #5   Title  pt to be I with all HEP given as of last visit to maintain and progress current level of function    Baseline  no previous HEP    Time  8    Period  Weeks    Status  New    Target Date  10/17/19             Plan - 08/22/19 1411    Clinical Impression Statement  pt present to OPPT s/p L knee medial/ lateral menisectomy  on 08/15/2019. She exhibits incrased pain and swelling limiting knee ROM and strength which is expected following surgery. She demonstrates antalgic gait pattern with limited stride bil and stance on LLE without an AD. She would benefit from physical therapy to decrease L knee  pain, improve ROM and strength, increase efficient gait pattern and maximize her function by addressing the deficits listed.    Personal Factors and Comorbidities  Comorbidity 2    Comorbidities  hx of chest pain, HTN,    Examination-Activity Limitations  Stand;Stairs;Squat;Locomotion Level    Stability/Clinical Decision Making  Evolving/Moderate complexity    Clinical Decision Making  Moderate    Rehab Potential  Good    PT Frequency  2x / week    PT Duration  8 weeks    PT Treatment/Interventions  ADLs/Self Care Home Management;Cryotherapy;Electrical Stimulation;Iontophoresis 4mg /ml Dexamethasone;Moist Heat;Ultrasound;Gait training;Stair training;Therapeutic activities;Therapeutic exercise;Balance training;Neuromuscular re-education;Patient/family education;Manual techniques;Dry needling;Taping;Vasopneumatic Device;Passive range of motion    PT Next Visit Plan  review/ update HEP PRN, knee ROM, gross LLE strength,  continued gait training, vaso for pain/ edema    PT Home Exercise Plan  - quad set, seated hamstring stretch, heel slide with strap and glute set    Consulted and Agree with Plan of Care  Patient       Patient will benefit from skilled therapeutic intervention in order to improve the following deficits and impairments:  Improper body mechanics, Increased muscle spasms, Pain, Decreased strength, Decreased range of motion, Abnormal gait, Decreased activity tolerance, Decreased balance, Decreased endurance, Increased edema, Postural dysfunction  Visit Diagnosis: Chronic pain of left knee  Muscle weakness (generalized)  Other abnormalities of gait and mobility  Localized edema     Problem  List Patient Active Problem List   Diagnosis Date Noted  . Preoperative clearance 07/24/2019  . Right arm pain 06/12/2019  . Syncope 07/06/2017  . Chronic daily headache 09/02/2015  . 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 Syncopal episodes 09/02/2015  . Right arm weakness 08/05/2015  . Edema of right arm and right leg 09/07/2014  . Intractable migraine with status migrainosus 06/10/2014  . Chronic migraine without aura, intractable, with status migrainosus   . Bradycardia 02/18/2013  . Cardiomyopathy 10/20/2011  . Leg swelling 09/23/2011  . Complex regional pain syndrome of right lower extremity 05/03/2011  . Chronic pain 03/29/2011  . NECK PAIN, RIGHT 02/03/2010  . ALLERGIC RHINITIS 01/30/2009  . DIZZINESS 07/16/2008  . Chronic migraine 04/26/2007  . OBESITY, NOS 08/18/2006  . HYPERTENSION, BENIGN SYSTEMIC 08/18/2006   08/20/2006 PT, DPT, LAT, ATC  08/22/19  3:06 PM      [facility] [address] [place], [place], [place] Phone: [phone]   Fax:  [phone]  Name: Kirsten Walsh MRN: Linard Millers Date of Birth: May 24, 1961

## 2019-09-05 ENCOUNTER — Other Ambulatory Visit: Payer: Self-pay

## 2019-09-05 ENCOUNTER — Ambulatory Visit: Payer: Medicaid Other | Admitting: Physical Therapy

## 2019-09-05 DIAGNOSIS — M25562 Pain in left knee: Secondary | ICD-10-CM | POA: Diagnosis not present

## 2019-09-05 DIAGNOSIS — G8929 Other chronic pain: Secondary | ICD-10-CM

## 2019-09-05 DIAGNOSIS — M6281 Muscle weakness (generalized): Secondary | ICD-10-CM

## 2019-09-05 DIAGNOSIS — R2689 Other abnormalities of gait and mobility: Secondary | ICD-10-CM

## 2019-09-05 NOTE — Therapy (Signed)
[facility] [address] [place], [place], [address] Phone: [phone]   Fax:  [phone]  Physical Therapy Treatment  Patient Details  Name: Kirsten Walsh MRN: [id number] Date of Birth: 04-16-1961 Referring Provider (PT): Frederico Hamman, MD    Encounter Date: 09/05/2019  PT End of Session - 09/05/19 1337    Visit Number  2    Number of Visits  17    Date for PT Re-Evaluation  10/17/19    Authorization Type  MCD - submitted on 08/22/2019    Authorization Time Period  09/04/2019 - 09/24/2019    PT Start Time  1337   pt arrived 7 min late   PT Stop Time  1413    PT Time Calculation (min)  36 min    Activity Tolerance  Patient tolerated treatment well    Behavior During Therapy  [facility] for tasks assessed/performed       Past Medical History:  Diagnosis Date  . Chest pain   . Chronic lower back pain    "right side to mid back" (07/06/2017)  . Family history of adverse reaction to anesthesia    "daughter did; not sure happened"  . Gall stones   . History of anaphylaxis 06/26/2014  . Hypertension   . Kidney stone ~ 2006  . Migraine    "qd" (07/06/2017)  . Syncope and collapse 07/06/2017    sitting on the commode; developed nausea and passed out; hit head on shower and suffered a laceration; woke up in "a pool of blood"    Past Surgical History:  Procedure Laterality Date  . CHONDROPLASTY Left 08/17/2019   Procedure: CHONDROPLASTY;  Surgeon: Frederico Hamman, MD;  Location: [facility];  Service: Orthopedics;  Laterality: Left;  . CYSTOSCOPY W/ STONE MANIPULATION  ~ 2006  . INTRAUTERINE DEVICE INSERTION     "initially put in in 04/2002; changed prn" (02/14/2013)  . KNEE ARTHROSCOPY WITH LATERAL MENISECTOMY  08/17/2019   Procedure: KNEE ARTHROSCOPY WITH LATERAL MENISECTOMY;  Surgeon: Frederico Hamman, MD;  Location: [facility];  Service: Orthopedics;;  . KNEE ARTHROSCOPY WITH MEDIAL MENISECTOMY Left  08/17/2019   Procedure: KNEE ARTHROSCOPY WITH MEDIAL MENISECTOMY;  Surgeon: Frederico Hamman, MD;  Location: [facility];  Service: Orthopedics;  Laterality: Left;  . LAPAROSCOPIC CHOLECYSTECTOMY  ~ 2004    There were no vitals filed for this visit.  Subjective Assessment - 09/05/19 1340    Subjective  " over a week ago I was in the bathroom and blacked out and hurt my L knee, and was unconscious for a period of time. The L knee was pretty swollen and still has some bruising but my pain management Md stated he thinks it is okay"    Currently in Pain?  Yes    Pain Score  9    last took pain meds at 12   Pain Location  Knee    Pain Orientation  Left    Pain Descriptors / Indicators  Aching    Pain Type  Chronic pain    Pain Onset  More than a month ago    Pain Frequency  Intermittent    Aggravating Factors   stadngin/ walking, laying down    Pain Relieving Factors  medication, ice                       [facility] Adult PT Treatment/Exercise - 09/05/19 0001      Exercises  Exercises  Knee/Hip      Knee/Hip Exercises: Supine   Quad Sets  10 reps;1 set;Strengthening   5 seconds     Modalities   Modalities  Vasopneumatic      Vasopneumatic   Number Minutes Vasopneumatic   10 minutes    Vasopnuematic Location   Knee    Vasopneumatic Pressure  Low    Vasopneumatic Temperature   34      Manual Therapy   Manual Therapy  Joint mobilization    Joint Mobilization  patellar mobs grade I-II  in all planes. tibiofemoral mobs grade II AP               PT Short Term Goals - 08/22/19 1415      PT SHORT TERM GOAL #1   Title  pt to be I with inital HEP    Baseline  no previous HEP    Time  3    Period  Weeks    Status  New    Target Date  09/12/19      PT SHORT TERM GOAL #2   Title  pt to verbalize and demonstrate techniques to reduce pain and inflammation via RICE and HEP    Baseline  used ice intermittently with pain    Time  3    Period  Weeks     Status  New    Target Date  09/12/19        PT Long Term Goals - 08/22/19 1417      PT LONG TERM GOAL #1   Title  improve L knee ROM to >/= 2 - 120 with </= 2/10 paain for functional ROM required for ADLs    Baseline  5 - 75 with 7/10 pain    Time  8    Period  Weeks    Status  New    Target Date  10/17/19      PT LONG TERM GOAL #2   Title  increase LLE gross strength to >/= 4/5 to promote knee stability with walking/ standing    Baseline  unable to test LLE strength secondary to irritability/ severity    Time  8    Period  Weeks    Status  New    Target Date  10/17/19      PT LONG TERM GOAL #3   Title  pt to be able to sit, stand or walk for >/= 60 min with </ 2/10 pain for functional endurance required for ADLs and work related tasks    Baseline  sit for 15-20 min, and stand/walk 5-10 min    Time  8    Period  Weeks    Status  New    Target Date  10/17/19      PT LONG TERM GOAL #4   Title  increase LEFS score to >/= 50/80 to demo improvement in function    Baseline  17/80 at eval    Time  8    Period  Weeks    Status  New    Target Date  10/17/19      PT LONG TERM GOAL #5   Title  pt to be I with all HEP given as of last visit to maintain and progress current level of function    Baseline  no previous HEP    Time  8    Period  Weeks    Status  New    Target Date  10/17/19  Plan - 09/05/19 1403    Clinical Impression Statement  pt reports falling about a week ago due to blacking out and has reported increased pain in the R knee at 9-10/10 constantly. She reports seeing her chronic pain management MD and stated they didn't think it was anything significant. Discussed if this high level of pain persist to try getting in with the Orthopedic MD for further assessment. limited session due to severity/ irritability of pain .focused on pain relief techniqes with grade I-II patellar/ tibiofemoral mobs and quad activition with pt being extremly anxious  and apprehensive due pain. utilized game ready at end of session to calm down soreness/ inflammation.    PT Treatment/Interventions  ADLs/Self Care Home Management;Cryotherapy;Electrical Stimulation;Iontophoresis 4mg /ml Dexamethasone;Moist Heat;Ultrasound;Gait training;Stair training;Therapeutic activities;Therapeutic exercise;Balance training;Neuromuscular re-education;Patient/family education;Manual techniques;Dry needling;Taping;Vasopneumatic Device;Passive range of motion    PT Next Visit Plan  review/ update HEP PRN, knee ROM, gross LLE strength,  continued gait training, vaso for pain/ edema    PT Home Exercise Plan  [id number] - quad set, seated hamstring stretch, heel slide with strap and glute set    Consulted and Agree with Plan of Care  Patient       Patient will benefit from skilled therapeutic intervention in order to improve the following deficits and impairments:  Improper body mechanics, Increased muscle spasms, Pain, Decreased strength, Decreased range of motion, Abnormal gait, Decreased activity tolerance, Decreased balance, Decreased endurance, Increased edema, Postural dysfunction  Visit Diagnosis: Chronic pain of left knee  Muscle weakness (generalized)  Other abnormalities of gait and mobility     Problem List Patient Active Problem List   Diagnosis Date Noted  . Preoperative clearance 07/24/2019  . Right arm pain 06/12/2019  . Syncope 07/06/2017  . Chronic daily headache 09/02/2015  . 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 Syncopal episodes 09/02/2015  . Right arm weakness 08/05/2015  . Edema of right arm and right leg 09/07/2014  . Intractable migraine with status migrainosus 06/10/2014  . Chronic migraine without aura, intractable, with status migrainosus   . Bradycardia 02/18/2013  . Cardiomyopathy 10/20/2011  . Leg swelling 09/23/2011  . Complex regional pain syndrome of right lower extremity 05/03/2011  . Chronic pain 03/29/2011  . NECK PAIN, RIGHT 02/03/2010   . ALLERGIC RHINITIS 01/30/2009  . DIZZINESS 07/16/2008  . Chronic migraine 04/26/2007  . OBESITY, NOS 08/18/2006  . HYPERTENSION, BENIGN SYSTEMIC 08/18/2006    Starr Lake PT, DPT, LAT, ATC  09/05/19  2:09 PM      [facility] [address] [place], [place], [address] Phone: [phone]   Fax:  [phone]  Name: Kirsten Walsh MRN: [id number] Date of Birth: December 05, 1960

## 2019-09-12 ENCOUNTER — Other Ambulatory Visit: Payer: Self-pay

## 2019-09-12 ENCOUNTER — Ambulatory Visit: Payer: Medicaid Other | Admitting: Physical Therapy

## 2019-09-12 DIAGNOSIS — G8929 Other chronic pain: Secondary | ICD-10-CM

## 2019-09-12 DIAGNOSIS — M25562 Pain in left knee: Secondary | ICD-10-CM | POA: Diagnosis not present

## 2019-09-12 DIAGNOSIS — R2689 Other abnormalities of gait and mobility: Secondary | ICD-10-CM

## 2019-09-12 DIAGNOSIS — R600 Localized edema: Secondary | ICD-10-CM

## 2019-09-12 DIAGNOSIS — M6281 Muscle weakness (generalized): Secondary | ICD-10-CM

## 2019-09-12 NOTE — Therapy (Addendum)
[place], [place], [address] Phone: [phone]   Fax:  [phone]  Physical Therapy Treatment / Discharge  Patient Details  Name: Kirsten Walsh MRN: [id number] Date of Birth: January 25, 1961 Referring Provider (PT): Earlie Server, MD    Encounter Date: 09/12/2019  PT End of Session - 09/12/19 1510    Visit Number  3    Number of Visits  17    Date for PT Re-Evaluation  10/17/19    Authorization Type  MCD - submitted on 08/22/2019    Authorization Time Period  09/04/2019 - 09/24/2019    Authorization - Visit Number  2    Authorization - Number of Visits  3    PT Start Time  1510   pt arrived 10 min late   PT Stop Time  1545    PT Time Calculation (min)  35 min       Past Medical History:  Diagnosis Date  . Chest pain   . Chronic lower back pain    "right side to mid back" (07/06/2017)  . Family history of adverse reaction to anesthesia    "daughter did; not sure happened"  . Gall stones   . History of anaphylaxis 06/26/2014  . Hypertension   . Kidney stone ~ 2006  . Migraine    "qd" (07/06/2017)  . Syncope and collapse 07/06/2017    sitting on the commode; developed nausea and passed out; hit head on shower and suffered a laceration; woke up in "a pool of blood"    Past Surgical History:  Procedure Laterality Date  . CHONDROPLASTY Left 08/17/2019   Procedure: CHONDROPLASTY;  Surgeon: Earlie Server, MD;  Location: [facility];  Service: Orthopedics;  Laterality: Left;  . CYSTOSCOPY W/ STONE MANIPULATION  ~ 2006  . INTRAUTERINE DEVICE INSERTION     "initially put in in 04/2002; changed prn" (02/14/2013)  . KNEE ARTHROSCOPY WITH LATERAL MENISECTOMY  08/17/2019   Procedure: KNEE ARTHROSCOPY WITH LATERAL MENISECTOMY;  Surgeon: Earlie Server, MD;  Location: [facility];  Service: Orthopedics;;  . KNEE ARTHROSCOPY WITH MEDIAL MENISECTOMY Left 08/17/2019   Procedure: KNEE  ARTHROSCOPY WITH MEDIAL MENISECTOMY;  Surgeon: Earlie Server, MD;  Location: [facility];  Service: Orthopedics;  Laterality: Left;  . LAPAROSCOPIC CHOLECYSTECTOMY  ~ 2004    There were no vitals filed for this visit.  Subjective Assessment - 09/12/19 1513    Subjective  " I am doing better since the last session, but I am still having pretty high pain that requies pain medication. I am trying to do more walking but I am having more issues"    Currently in Pain?  Yes    Pain Score  7    took medication prior to coming to PT        [facility] PT Assessment - 09/12/19 0001      Assessment   Medical Diagnosis  L knee menisectomy     Referring Provider (PT)  Earlie Server, MD                    [facility] Adult PT Treatment/Exercise - 09/12/19 0001      Self-Care   Self-Care  Other Self-Care Comments    Other Self-Care Comments   PNE throughout edema reduction massage      Knee/Hip Exercises: Stretches   Active Hamstring Stretch  2 reps;Left;30 seconds      Vasopneumatic   Number Minutes  Vasopneumatic   10 minutes    Vasopnuematic Location   Knee    Vasopneumatic Pressure  Medium    Vasopneumatic Temperature   34      Manual Therapy   Manual Therapy  Other (comment)    Manual therapy comments  Antegrade/ retrograde massage to reduce knee swelling    Joint Mobilization  patellar mobs grade I-II  in all planes. tibiofemoral mobs grade II AP    Other Manual Therapy  MTPR along the L vastus lateralis               PT Short Term Goals - 08/22/19 1415      PT SHORT TERM GOAL #1   Title  pt to be I with inital HEP    Baseline  no previous HEP    Time  3    Period  Weeks    Status  New    Target Date  09/12/19      PT SHORT TERM GOAL #2   Title  pt to verbalize and demonstrate techniques to reduce pain and inflammation via RICE and HEP    Baseline  used ice intermittently with pain    Time  3    Period  Weeks    Status  New    Target Date   09/12/19        PT Long Term Goals - 08/22/19 1417      PT LONG TERM GOAL #1   Title  improve L knee ROM to >/= 2 - 120 with </= 2/10 paain for functional ROM required for ADLs    Baseline  5 - 75 with 7/10 pain    Time  8    Period  Weeks    Status  New    Target Date  10/17/19      PT LONG TERM GOAL #2   Title  increase LLE gross strength to >/= 4/5 to promote knee stability with walking/ standing    Baseline  unable to test LLE strength secondary to irritability/ severity    Time  8    Period  Weeks    Status  New    Target Date  10/17/19      PT LONG TERM GOAL #3   Title  pt to be able to sit, stand or walk for >/= 60 min with </ 2/10 pain for functional endurance required for ADLs and work related tasks    Baseline  sit for 15-20 min, and stand/walk 5-10 min    Time  8    Period  Weeks    Status  New    Target Date  10/17/19      PT LONG TERM GOAL #4   Title  increase LEFS score to >/= 50/80 to demo improvement in function    Baseline  17/80 at eval    Time  8    Period  Weeks    Status  New    Target Date  10/17/19      PT LONG TERM GOAL #5   Title  pt to be I with all HEP given as of last visit to maintain and progress current level of function    Baseline  no previous HEP    Time  8    Period  Weeks    Status  New    Target Date  10/17/19            Plan - 09/12/19 1638  Clinical Impression Statement  pt arrived 10 min late today. limited session due to continued high level of pain/ irritability. focused on edema reduction techniques and patellar mobilization using lower grades to reduce pain. continued using Vaso end of session to calm down pain and edema end of session. discussed with pt that if she is still having issues with the knee and tha thigh level of pain then she would benefit from returning to her referring MD for further assessment.    PT Treatment/Interventions  ADLs/Self Care Home Management;Cryotherapy;Electrical  Stimulation;Iontophoresis 58m/ml Dexamethasone;Moist Heat;Ultrasound;Gait training;Stair training;Therapeutic activities;Therapeutic exercise;Balance training;Neuromuscular re-education;Patient/family education;Manual techniques;Dry needling;Taping;Vasopneumatic Device;Passive range of motion    PT Next Visit Plan  review/ update HEP PRN, knee ROM, gross LLE strength,  continued gait training, vaso for pain/ edema    PT Home Exercise Plan  [id number]- quad set, seated hamstring stretch, heel slide with strap and glute set    Consulted and Agree with Plan of Care  Patient       Patient will benefit from skilled therapeutic intervention in order to improve the following deficits and impairments:  Improper body mechanics, Increased muscle spasms, Pain, Decreased strength, Decreased range of motion, Abnormal gait, Decreased activity tolerance, Decreased balance, Decreased endurance, Increased edema, Postural dysfunction  Visit Diagnosis: Chronic pain of left knee  Muscle weakness (generalized)  Other abnormalities of gait and mobility  Localized edema     Problem List Patient Active Problem List   Diagnosis Date Noted  . Preoperative clearance 07/24/2019  . Right arm pain 06/12/2019  . Syncope 07/06/2017  . Chronic daily headache 09/02/2015  . 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 Syncopal episodes 09/02/2015  . Right arm weakness 08/05/2015  . Edema of right arm and right leg 09/07/2014  . Intractable migraine with status migrainosus 06/10/2014  . Chronic migraine without aura, intractable, with status migrainosus   . Bradycardia 02/18/2013  . Cardiomyopathy 10/20/2011  . Leg swelling 09/23/2011  . Complex regional pain syndrome of right lower extremity 05/03/2011  . Chronic pain 03/29/2011  . NECK PAIN, RIGHT 02/03/2010  . ALLERGIC RHINITIS 01/30/2009  . DIZZINESS 07/16/2008  . Chronic migraine 04/26/2007  . OBESITY, NOS 08/18/2006  . HYPERTENSION, BENIGN SYSTEMIC 08/18/2006     KStarr LakePT, DPT, LAT, ATC  09/12/19  4:42 PM      [facility]GKenmar N[place] [address]Phone: [phone]  Fax:  [phone] Name: Kirsten SILOSMRN: [id number] of Birth: 111-Nov-1962     PHYSICAL THERAPY DISCHARGE SUMMARY  Visits from Start of Care: 3  Current functional level related to goals / functional outcomes: See goals   Remaining deficits: Current status unknown   Education / Equipment: HEP  Plan: Patient agrees to discharge.  Patient goals were not met. Patient is being discharged due to not returning since the last visit.  ?????        Holli Rengel PT, DPT, LAT, ATC  10/23/19  2:51 PM

## 2019-09-19 ENCOUNTER — Ambulatory Visit: Payer: Medicaid Other | Admitting: Physical Therapy

## 2019-09-20 ENCOUNTER — Ambulatory Visit: Payer: Medicaid Other | Attending: Orthopedic Surgery

## 2019-10-03 ENCOUNTER — Ambulatory Visit: Payer: Medicaid Other | Admitting: Physical Therapy

## 2019-11-22 ENCOUNTER — Ambulatory Visit: Payer: Medicaid Other | Admitting: Physical Therapy

## 2019-12-07 ENCOUNTER — Ambulatory Visit: Payer: Medicaid Other | Attending: Physician Assistant | Admitting: Physical Therapy

## 2019-12-20 DIAGNOSIS — Z419 Encounter for procedure for purposes other than remedying health state, unspecified: Secondary | ICD-10-CM | POA: Diagnosis not present

## 2019-12-26 ENCOUNTER — Ambulatory Visit: Payer: Medicaid Other | Admitting: Physical Therapy

## 2019-12-26 DIAGNOSIS — M5137 Other intervertebral disc degeneration, lumbosacral region: Secondary | ICD-10-CM | POA: Diagnosis not present

## 2019-12-26 DIAGNOSIS — M25511 Pain in right shoulder: Secondary | ICD-10-CM | POA: Diagnosis not present

## 2019-12-26 DIAGNOSIS — M545 Low back pain: Secondary | ICD-10-CM | POA: Diagnosis not present

## 2019-12-26 DIAGNOSIS — G629 Polyneuropathy, unspecified: Secondary | ICD-10-CM | POA: Diagnosis not present

## 2019-12-26 DIAGNOSIS — M542 Cervicalgia: Secondary | ICD-10-CM | POA: Diagnosis not present

## 2019-12-26 DIAGNOSIS — G894 Chronic pain syndrome: Secondary | ICD-10-CM | POA: Diagnosis not present

## 2019-12-26 DIAGNOSIS — G501 Atypical facial pain: Secondary | ICD-10-CM | POA: Diagnosis not present

## 2019-12-26 DIAGNOSIS — R7303 Prediabetes: Secondary | ICD-10-CM | POA: Diagnosis not present

## 2019-12-26 DIAGNOSIS — M25512 Pain in left shoulder: Secondary | ICD-10-CM | POA: Diagnosis not present

## 2019-12-26 DIAGNOSIS — Z6831 Body mass index (BMI) 31.0-31.9, adult: Secondary | ICD-10-CM | POA: Diagnosis not present

## 2019-12-26 DIAGNOSIS — E1049 Type 1 diabetes mellitus with other diabetic neurological complication: Secondary | ICD-10-CM | POA: Diagnosis not present

## 2019-12-27 ENCOUNTER — Ambulatory Visit: Payer: Medicaid Other

## 2019-12-27 ENCOUNTER — Other Ambulatory Visit: Payer: Self-pay

## 2019-12-27 ENCOUNTER — Ambulatory Visit: Payer: Medicaid Other | Attending: Physician Assistant

## 2019-12-27 DIAGNOSIS — R2689 Other abnormalities of gait and mobility: Secondary | ICD-10-CM | POA: Insufficient documentation

## 2019-12-27 DIAGNOSIS — G8929 Other chronic pain: Secondary | ICD-10-CM | POA: Insufficient documentation

## 2019-12-27 DIAGNOSIS — R600 Localized edema: Secondary | ICD-10-CM | POA: Diagnosis not present

## 2019-12-27 DIAGNOSIS — M6281 Muscle weakness (generalized): Secondary | ICD-10-CM | POA: Diagnosis not present

## 2019-12-27 DIAGNOSIS — M25562 Pain in left knee: Secondary | ICD-10-CM | POA: Diagnosis not present

## 2019-12-27 NOTE — Therapy (Signed)
[facility] [address] [place], [place], [address] Phone: [phone]   Fax:  [phone]  Physical Therapy Evaluation  Patient Details  Name: Linard MillersConstance M Zeisler MRN: [id number] Date of Birth: 01-Nov-1960 Referring Provider (PT): Frederico Hammanaffrey, Daniel, MD    Encounter Date: 12/27/2019   PT End of Session - 12/27/19 1008    Visit Number 1    Number of Visits 13    Date for PT Re-Evaluation 02/15/20    Authorization Type MCD s    PT Start Time 0935   Pt late   PT Stop Time 1000    PT Time Calculation (min) 25 min    Activity Tolerance Patient tolerated treatment well;Patient limited by pain    Behavior During Therapy [facility] for tasks assessed/performed           Past Medical History:  Diagnosis Date   Chest pain    Chronic lower back pain    "right side to mid back" (07/06/2017)   Family history of adverse reaction to anesthesia    "daughter did; not sure happened"   Gall stones    History of anaphylaxis 06/26/2014   Hypertension    Kidney stone ~ 2006   Migraine    "qd" (07/06/2017)   Syncope and collapse 07/06/2017    sitting on the commode; developed nausea and passed out; hit head on shower and suffered a laceration; woke up in "a pool of blood"    Past Surgical History:  Procedure Laterality Date   CHONDROPLASTY Left 08/17/2019   Procedure: CHONDROPLASTY;  Surgeon: Frederico Hammanaffrey, Daniel, MD;  Location: [facility];  Service: Orthopedics;  Laterality: Left;   CYSTOSCOPY W/ STONE MANIPULATION  ~ 2006   INTRAUTERINE DEVICE INSERTION     "initially put in in 04/2002; changed prn" (02/14/2013)   KNEE ARTHROSCOPY WITH LATERAL MENISECTOMY  08/17/2019   Procedure: KNEE ARTHROSCOPY WITH LATERAL MENISECTOMY;  Surgeon: Frederico Hammanaffrey, Daniel, MD;  Location: [facility];  Service: Orthopedics;;   KNEE ARTHROSCOPY WITH MEDIAL MENISECTOMY Left 08/17/2019   Procedure: KNEE ARTHROSCOPY WITH MEDIAL MENISECTOMY;   Surgeon: Frederico Hammanaffrey, Daniel, MD;  Location: [facility];  Service: Orthopedics;  Laterality: Left;   LAPAROSCOPIC CHOLECYSTECTOMY  ~ 2004    There were no vitals filed for this visit.    Subjective Assessment - 12/27/19 0938    Subjective She reports post Lt knee menisectomy and chondroplasty.  Pain with bending and walking. and standing.  Sx date 08/17/2019.  Stopped PT last episode of care due to other medical issues and family issues.  Injection about a month ago without benefit.    How long can you stand comfortably? of feet at work for 20 min    How long can you walk comfortably? 20 min or so at work  conmbo with standing    Patient Stated Goals to decrease pain    Currently in Pain? Yes    Pain Score 6     Pain Location Knee    Pain Orientation Left;Medial;Lateral    Pain Descriptors / Indicators Aching    Pain Type Chronic pain    Pain Onset More than a month ago    Pain Frequency Constant    Aggravating Factors  standing /walking    Pain Relieving Factors meds / cold/elevate              [facility] PT Assessment - 12/27/19 0001      Assessment   Medical Diagnosis L knee  menisectomy     Referring Provider (PT) Frederico Hamman, MD     Onset Date/Surgical Date 08/17/19    Next MD Visit As needed    Prior Therapy 3-4 visitearlier this years       Precautions   Precautions None      Restrictions   Weight Bearing Restrictions No      Balance Screen   Has the patient fallen in the past 6 months No      Home Environment   Home Access Stairs to enter    Entrance Stairs-Number of Steps 3    Entrance Stairs-Rails None    Home Layout Multi-level    Alternate Level Stairs-Number of Steps 15    Alternate Level Stairs-Rails Left      Prior Function   Level of Independence Independent with basic ADLs    Vocation Full time employment    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       Cognition   Overall Cognitive Status  Within Functional Limits for tasks assessed      Observation/Other Assessments-Edema    Edema Circumferential      Circumferential Edema   Circumferential - Right 48 cm    Circumferential - Left  49 cm      AROM   Right Knee Extension 0    Right Knee Flexion 115    Left Knee Extension -5    Left Knee Flexion 75   in supine  82 in sitting     PROM   Left Knee Extension -3    Left Knee Flexion 83   incr pain     Strength   Right Hip Flexion 4/5    Left Hip Flexion 3-/5    Left Hip Extension 3-/5    Left Hip ABduction 3/5    Right Knee Flexion 4+/5    Right Knee Extension 4+/5      Ambulation/Gait   Gait Comments antalgic gait LT decr knee extension                      Objective measurements completed on examination: See above findings.                 PT Short Term Goals - 12/27/19 1048      PT SHORT TERM GOAL #1   Title pt to be I with inital HEP    Baseline not doing last EOC HEP and encouraged to do this    Time 3    Period Weeks    Status New      PT SHORT TERM GOAL #2   Title pt to verbalize and demonstrate techniques to reduce pain and inflammation via RICE and HEP    Baseline used ice/elevation intermittently with pain    Time 3    Period Weeks    Status New      PT SHORT TERM GOAL #3   Title Active LT knee flexion increase to 90 degrees or better    Time 3    Period Weeks    Status New      PT SHORT TERM GOAL #4   Title Pain reduced 10-15% Lt knee with standing/walking at [facility] over 20 min time frame    Baseline 20 min but increased pain at work    Time 3    Period Weeks    Status New  PT Long Term Goals - 12/27/19 1050      PT LONG TERM GOAL #1   Title improve L knee ROM to >/= 2 - 120 with </= 2/10 paain for functional ROM required for ADLs    Baseline high as 9/10    Time 6    Period Weeks    Status New      PT LONG TERM GOAL #2   Title increase LLE gross strength to >/= 4/5 to promote knee  stability with walking/ standing    Baseline pain limits effort    Time 6    Period Weeks    Status New      PT LONG TERM GOAL #3   Title pt to be able to stand or walk for >/= 30 min with </ 2/10 pain for functional endurance required for ADLs and work related tasks    Baseline 20 min max on feet with incr pain    Time 6    Period Weeks    Status New      PT LONG TERM GOAL #4   Title She will report pain max 4/10 with activity on feet.    Baseline high as 9/10    Time 6    Period Weeks    Status New      PT LONG TERM GOAL #5   Title pt to be I with all HEP given as of last visit to maintain and progress current level of function    Baseline independent with initial HEP    Time 6    Period Weeks    Status New                  Plan - 12/27/19 1009    Clinical Impression Statement Arrived 30 min late. She presents with LT knee pain and limited motion and weakness of TL knee and hip.   She has antalgic gait. She is limited with work and home tasks. She was emotinal about her pain and limitations.   She did not do HEP issued at last EOC. She should improve with skilled PT and if she is consistent with HEP.    Personal Factors and Comorbidities Comorbidity 2    Comorbidities hx of chest pain, HTN,    Examination-Activity Limitations Stand;Stairs;Squat;Locomotion Level    Examination-Participation Restrictions Meal Prep;Community Activity;Shop;Laundry    Stability/Clinical Decision Making Stable/Uncomplicated    Clinical Decision Making Low    Rehab Potential Good    PT Frequency --   3 visit   PT Treatment/Interventions ADLs/Self Care Home Management;Cryotherapy;Electrical Stimulation;Iontophoresis 4mg /ml Dexamethasone;Moist Heat;Ultrasound;Gait training;Stair training;Therapeutic activities;Therapeutic exercise;Balance training;Neuromuscular re-education;Patient/family education;Manual techniques;Dry needling;Taping;Vasopneumatic Device;Passive range of motion            Patient will benefit from skilled therapeutic intervention in order to improve the following deficits and impairments:  Improper body mechanics, Increased muscle spasms, Pain, Decreased strength, Decreased range of motion, Abnormal gait, Decreased activity tolerance, Decreased balance, Decreased endurance, Increased edema, Postural dysfunction  Visit Diagnosis: Chronic pain of left knee  Muscle weakness (generalized)  Other abnormalities of gait and mobility     Problem List Patient Active Problem List   Diagnosis Date Noted   Preoperative clearance 07/24/2019   Right arm pain 06/12/2019   Syncope 07/06/2017   Chronic daily headache 09/02/2015   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   Syncopal episodes 09/02/2015   Right arm weakness 08/05/2015   Edema of right arm and right leg 09/07/2014   Intractable migraine with status  migrainosus 06/10/2014   Chronic migraine without aura, intractable, with status migrainosus    Bradycardia 02/18/2013   Cardiomyopathy 10/20/2011   Leg swelling 09/23/2011   Complex regional pain syndrome of right lower extremity 05/03/2011   Chronic pain 03/29/2011   NECK PAIN, RIGHT 02/03/2010   ALLERGIC RHINITIS 01/30/2009   DIZZINESS 07/16/2008   Chronic migraine 04/26/2007   OBESITY, NOS 08/18/2006   HYPERTENSION, BENIGN SYSTEMIC 08/18/2006    Caprice Red  PT 12/27/2019, 10:57 AM  [facility] [address] [place], [place], [address] Phone: [phone]   Fax:  [phone]  Name: MARIHA SLEEPER MRN: [id number] Date of Birth: 05/24/1961

## 2020-01-03 DIAGNOSIS — R7303 Prediabetes: Secondary | ICD-10-CM | POA: Diagnosis not present

## 2020-01-03 DIAGNOSIS — Z1239 Encounter for other screening for malignant neoplasm of breast: Secondary | ICD-10-CM | POA: Diagnosis not present

## 2020-01-03 DIAGNOSIS — I10 Essential (primary) hypertension: Secondary | ICD-10-CM | POA: Diagnosis not present

## 2020-01-03 DIAGNOSIS — Z6835 Body mass index (BMI) 35.0-35.9, adult: Secondary | ICD-10-CM | POA: Diagnosis not present

## 2020-01-03 DIAGNOSIS — Z124 Encounter for screening for malignant neoplasm of cervix: Secondary | ICD-10-CM | POA: Diagnosis not present

## 2020-01-03 DIAGNOSIS — I8391 Asymptomatic varicose veins of right lower extremity: Secondary | ICD-10-CM | POA: Diagnosis not present

## 2020-01-04 ENCOUNTER — Ambulatory Visit: Payer: Medicaid Other | Admitting: Family Medicine

## 2020-01-04 ENCOUNTER — Other Ambulatory Visit: Payer: Self-pay

## 2020-01-07 ENCOUNTER — Telehealth: Payer: Self-pay | Admitting: Physical Therapy

## 2020-01-07 ENCOUNTER — Ambulatory Visit: Payer: Medicaid Other | Admitting: Physical Therapy
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
## 2020-01-11 ENCOUNTER — Ambulatory Visit: Payer: Medicaid Other | Admitting: Family Medicine

## 2020-01-14 ENCOUNTER — Encounter: Payer: Self-pay | Admitting: Physical Therapy

## 2020-01-14 ENCOUNTER — Other Ambulatory Visit: Payer: Self-pay

## 2020-01-14 ENCOUNTER — Ambulatory Visit: Payer: Medicaid Other | Admitting: Physical Therapy

## 2020-01-14 DIAGNOSIS — R600 Localized edema: Secondary | ICD-10-CM | POA: Diagnosis not present

## 2020-01-14 DIAGNOSIS — M25562 Pain in left knee: Secondary | ICD-10-CM | POA: Diagnosis not present

## 2020-01-14 DIAGNOSIS — M6281 Muscle weakness (generalized): Secondary | ICD-10-CM | POA: Diagnosis not present

## 2020-01-14 DIAGNOSIS — G8929 Other chronic pain: Secondary | ICD-10-CM

## 2020-01-14 DIAGNOSIS — R2689 Other abnormalities of gait and mobility: Secondary | ICD-10-CM | POA: Diagnosis not present
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
## 2020-01-15 ENCOUNTER — Other Ambulatory Visit: Payer: Self-pay

## 2020-01-15 DIAGNOSIS — R600 Localized edema: Secondary | ICD-10-CM

## 2020-01-20 DIAGNOSIS — Z419 Encounter for procedure for purposes other than remedying health state, unspecified: Secondary | ICD-10-CM | POA: Diagnosis not present

## 2020-01-21 ENCOUNTER — Telehealth: Payer: Self-pay | Admitting: Physical Therapy

## 2020-01-21 ENCOUNTER — Ambulatory Visit: Payer: Medicaid Other | Attending: Physician Assistant

## 2020-01-21 DIAGNOSIS — M25562 Pain in left knee: Secondary | ICD-10-CM | POA: Insufficient documentation

## 2020-01-21 DIAGNOSIS — R600 Localized edema: Secondary | ICD-10-CM | POA: Insufficient documentation

## 2020-01-21 DIAGNOSIS — M6281 Muscle weakness (generalized): Secondary | ICD-10-CM | POA: Insufficient documentation

## 2020-01-21 DIAGNOSIS — R2689 Other abnormalities of gait and mobility: Secondary | ICD-10-CM | POA: Insufficient documentation

## 2020-01-21 DIAGNOSIS — G8929 Other chronic pain: Secondary | ICD-10-CM | POA: Insufficient documentation

## 2020-01-21 NOTE — Telephone Encounter (Signed)
Spoke to Kirsten Walsh and she reports she did not know about this appointment. She was told about 01/28/20 appointment  at 930. She was also given clinic phone # [phone] and told she could call to try and reschedule this missed appointment.

## 2020-01-23 ENCOUNTER — Other Ambulatory Visit: Payer: Self-pay

## 2020-01-23 ENCOUNTER — Ambulatory Visit (HOSPITAL_COMMUNITY)
Admission: RE | Admit: 2020-01-23 | Discharge: 2020-01-23 | Disposition: A | Discharge status: Home / Self Care | Payer: Medicaid Other | Source: Ambulatory Visit | Attending: Vascular Surgery | Admitting: Vascular Surgery

## 2020-01-23 DIAGNOSIS — R600 Localized edema: Secondary | ICD-10-CM | POA: Insufficient documentation

## 2020-01-24 ENCOUNTER — Ambulatory Visit (INDEPENDENT_AMBULATORY_CARE_PROVIDER_SITE_OTHER): Payer: Medicaid Other | Admitting: Physician Assistant

## 2020-01-24 VITALS — BP 143/87 | HR 86 | Resp 20 | Ht 65.0 in

## 2020-01-24 DIAGNOSIS — M79604 Pain in right leg: Secondary | ICD-10-CM | POA: Diagnosis not present

## 2020-01-24 DIAGNOSIS — M79605 Pain in left leg: Secondary | ICD-10-CM
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
## 2020-01-28 ENCOUNTER — Encounter: Payer: Self-pay | Admitting: Physical Therapy

## 2020-01-28 ENCOUNTER — Other Ambulatory Visit: Payer: Self-pay

## 2020-01-28 ENCOUNTER — Ambulatory Visit: Payer: Medicaid Other | Admitting: Physical Therapy

## 2020-01-28 DIAGNOSIS — M6281 Muscle weakness (generalized): Secondary | ICD-10-CM | POA: Diagnosis not present

## 2020-01-28 DIAGNOSIS — M25562 Pain in left knee: Secondary | ICD-10-CM

## 2020-01-28 DIAGNOSIS — R2689 Other abnormalities of gait and mobility: Secondary | ICD-10-CM

## 2020-01-28 DIAGNOSIS — R600 Localized edema: Secondary | ICD-10-CM

## 2020-01-28 DIAGNOSIS — G8929 Other chronic pain: Secondary | ICD-10-CM | POA: Diagnosis not present

## 2020-01-28 NOTE — Therapy (Signed)
[facility] [place], [place], [address] Phone: [phone]   Fax:  [phone]  Physical Therapy Treatment / discharge  Patient Details  Name: Kirsten Walsh MRN: [id number] Date of Birth: 01/13/1961 Referring Provider (PT): Kirsten Server, MD    Encounter Date: 01/28/2020   PT End of Session - 01/28/20 0944    Visit Number 3    Number of Visits 13    Date for PT Re-Evaluation 02/15/20    Authorization Type [employer] managed    Authorization - Visit Number 2    Authorization - Number of Visits 27    PT Start Time [phone]   pt arrived 14 min late late   PT Stop Time 1018    PT Time Calculation (min) 34 min    Activity Tolerance Patient tolerated treatment well;Patient limited by pain    Behavior During Therapy [facility] for tasks assessed/performed           Past Medical History:  Diagnosis Date   Chest pain    Chronic lower back pain    "right side to mid back" (07/06/2017)   Family history of adverse reaction to anesthesia    "daughter did; not sure happened"   Gall stones    History of anaphylaxis 06/26/2014   Hypertension    Kidney stone ~ 2006   Migraine    "qd" (07/06/2017)   Syncope and collapse 07/06/2017    sitting on the commode; developed nausea and passed out; hit head on shower and suffered a laceration; woke up in "a pool of blood"    Past Surgical History:  Procedure Laterality Date   CHONDROPLASTY Left 08/17/2019   Procedure: CHONDROPLASTY;  Surgeon: Kirsten Server, MD;  Location: [place];  Service: Orthopedics;  Laterality: Left;   CYSTOSCOPY W/ STONE MANIPULATION  ~ 2006   INTRAUTERINE DEVICE INSERTION     "initially put in in 04/2002; changed prn" (02/14/2013)   KNEE ARTHROSCOPY WITH LATERAL MENISECTOMY  08/17/2019   Procedure: KNEE ARTHROSCOPY WITH LATERAL MENISECTOMY;  Surgeon: Kirsten Server, MD;  Location: [place];  Service: Orthopedics;;    KNEE ARTHROSCOPY WITH MEDIAL MENISECTOMY Left 08/17/2019   Procedure: KNEE ARTHROSCOPY WITH MEDIAL MENISECTOMY;  Surgeon: Kirsten Server, MD;  Location: [place];  Service: Orthopedics;  Laterality: Left;   LAPAROSCOPIC CHOLECYSTECTOMY  ~ 2004    There were no vitals filed for this visit.   Subjective Assessment - 01/28/20 0946    Subjective "my pain continues flucuate, sometimes its better and sometimess its note. The vascular special list found a cyst in the knee with increased fluid."              [facility] PT Assessment - 01/28/20 0001      Assessment   Medical Diagnosis L knee menisectomy     Referring Provider (PT) Kirsten Server, MD     Onset Date/Surgical Date 08/17/19    Hand Dominance Right      AROM   Left Knee Extension -20    Left Knee Flexion 72      PROM   Left Knee Flexion 88      Strength   Left Hip Flexion 3-/5    Left Hip Extension 3-/5   with trunk posterior lean and pain inthe back and knee   Left Hip ABduction 3-/5    Left Knee Flexion 3/5   in available ROM with pain during testing    Left Knee Extension  3/5   in available ROM with pain during testing                         [facility] Adult PT Treatment/Exercise - 01/28/20 0001      Modalities   Modalities Moist Heat      Moist Heat Therapy   Number Minutes Moist Heat 10 Minutes    Moist Heat Location Knee   L knee                 PT Education - 01/28/20 1035    Education Details reivewed HEP and discussed importance of consistency with her HEP . progression with walking gradually increasing 1 min a week to gradually work on Teaching laboratory technician.    Person(s) Educated Patient    Methods Explanation;Verbal cues;Handout    Comprehension Verbalized understanding;Verbal cues required            PT Short Term Goals - 01/28/20 0953      PT SHORT TERM GOAL #1   Title pt to be I with inital HEP    Baseline doing them 2 x a week.    Period Weeks     Status On-going      PT SHORT TERM GOAL #2   Title pt to verbalize and demonstrate techniques to reduce pain and inflammation via RICE and HEP    Baseline elevates te knee nightly, icing 8 -10 min    Period Weeks    Status Partially Met      PT SHORT TERM GOAL #3   Title Active LT knee flexion increase to 90 degrees or better    Baseline today active knee flexion 72    Period Weeks    Status Not Met      PT SHORT TERM GOAL #4   Title Pain reduced 10-15% Lt knee with standing/walking at [facility] over 20 min time frame    Baseline walking and conitnued pain.    Status Not Met             PT Long Term Goals - 01/28/20 0959      PT LONG TERM GOAL #1   Title improve L knee ROM to >/= 2 - 120 with </= 2/10 paain for functional ROM required for ADLs    Baseline 20 - 72 with pain increasing to 8-9/10 during asessment    Period Weeks    Status Not Met      PT LONG TERM GOAL #2   Title increase LLE gross strength to >/= 4/5 to promote knee stability with walking/ standing      PT LONG TERM GOAL #3   Title pt to be able to stand or walk for >/= 30 min with </ 2/10 pain for functional endurance required for ADLs and work related tasks    Baseline 15 min max with increased pain      PT LONG TERM GOAL #4   Title She will report pain max 4/10 with activity on feet.    Baseline high as 9/10    Status Not Met      PT LONG TERM GOAL #5   Title pt to be I with all HEP given as of last visit to maintain and progress current level of function                 Plan - 01/28/20 1026    Clinical Impression Statement [name] conitnues to report fluctuating pain  in the L knee with max being 9/10 and conitnues to demonstrate limited knee ROM measruing 20 - 72 degrees today. Additionally she reports having pain in the R knee which is progressively giving her issues. she reports compliance with her HEP ~ 2 x a week and notes she has a referral to see a new orthopedic MD. Based on  limited knee ROM, continued fluctuating pain and overal funcitonal progress plan to discharged back to her MD for further assessment.    PT Treatment/Interventions ADLs/Self Care Home Management;Cryotherapy;Electrical Stimulation;Iontophoresis '4mg'$ /ml Dexamethasone;Moist Heat;Ultrasound;Gait training;Stair training;Therapeutic activities;Therapeutic exercise;Balance training;Neuromuscular re-education;Patient/family education;Manual techniques;Dry needling;Taping;Vasopneumatic Device;Passive range of motion    PT Next Visit Plan D/C today    PT Home Exercise Plan [id number] - quad set, seated hamstring stretch, heel slide with strap and glute set    Consulted and Agree with Plan of Care Patient           Patient will benefit from skilled therapeutic intervention in order to improve the following deficits and impairments:  Improper body mechanics, Increased muscle spasms, Pain, Decreased strength, Decreased range of motion, Abnormal gait, Decreased activity tolerance, Decreased balance, Decreased endurance, Increased edema, Postural dysfunction  Visit Diagnosis: Chronic pain of left knee  Muscle weakness (generalized)  Other abnormalities of gait and mobility  Localized edema     Problem List Patient Active Problem List   Diagnosis Date Noted   Preoperative clearance 07/24/2019   Right arm pain 06/12/2019   Syncope 07/06/2017   Chronic daily headache 09/02/2015   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   Syncopal episodes 09/02/2015   Right arm weakness 08/05/2015   Edema of right arm and right leg 09/07/2014   Intractable migraine with status migrainosus 06/10/2014   Chronic migraine without aura, intractable, with status migrainosus    Bradycardia 02/18/2013   Cardiomyopathy 10/20/2011   Leg swelling 09/23/2011   Complex regional pain syndrome of right lower extremity 05/03/2011   Chronic pain 03/29/2011   NECK PAIN, RIGHT 02/03/2010   ALLERGIC RHINITIS 01/30/2009    DIZZINESS 07/16/2008   Chronic migraine 04/26/2007   OBESITY, NOS 08/18/2006   HYPERTENSION, BENIGN SYSTEMIC 08/18/2006    [place] 01/28/2020, 10:38 AM  [facility] [address] [place], [place], [address] Phone: [phone]   Fax:  [phone]  Name: Kirsten Walsh MRN: [id number] Date of Birth: [birth year]      PHYSICAL THERAPY DISCHARGE SUMMARY  Visits from Start of Care: 3  Current functional level related to goals / functional outcomes: See goals,    Remaining deficits: See assessment above   Education / Equipment: HEP, theraband, posture  Plan: Patient agrees to discharge.  Patient goals were not met. Patient is being discharged due to lack of progress.  ?????         Shagun Wordell PT, DPT, LAT, ATC  01/28/20  10:39 AM

## 2020-01-31 DIAGNOSIS — M25512 Pain in left shoulder: Secondary | ICD-10-CM | POA: Diagnosis not present

## 2020-02-12 DIAGNOSIS — G894 Chronic pain syndrome: Secondary | ICD-10-CM | POA: Diagnosis not present

## 2020-02-12 DIAGNOSIS — M25512 Pain in left shoulder: Secondary | ICD-10-CM | POA: Diagnosis not present

## 2020-02-12 DIAGNOSIS — M545 Low back pain: Secondary | ICD-10-CM | POA: Diagnosis not present

## 2020-02-12 DIAGNOSIS — M542 Cervicalgia: Secondary | ICD-10-CM | POA: Diagnosis not present

## 2020-02-12 DIAGNOSIS — Z6831 Body mass index (BMI) 31.0-31.9, adult: Secondary | ICD-10-CM | POA: Diagnosis not present

## 2020-02-12 DIAGNOSIS — M5137 Other intervertebral disc degeneration, lumbosacral region: Secondary | ICD-10-CM | POA: Diagnosis not present

## 2020-02-12 DIAGNOSIS — E1049 Type 1 diabetes mellitus with other diabetic neurological complication: Secondary | ICD-10-CM | POA: Diagnosis not present

## 2020-02-12 DIAGNOSIS — M25511 Pain in right shoulder: Secondary | ICD-10-CM | POA: Diagnosis not present

## 2020-02-12 DIAGNOSIS — G629 Polyneuropathy, unspecified: Secondary | ICD-10-CM | POA: Diagnosis not present

## 2020-02-12 DIAGNOSIS — S134XXA Sprain of ligaments of cervical spine, initial encounter: Secondary | ICD-10-CM | POA: Diagnosis not present

## 2020-02-12 DIAGNOSIS — W2211XA Striking against or struck by driver side automobile airbag, initial encounter: Secondary | ICD-10-CM | POA: Diagnosis not present

## 2020-02-13 ENCOUNTER — Other Ambulatory Visit: Payer: Self-pay

## 2020-02-13 ENCOUNTER — Ambulatory Visit (INDEPENDENT_AMBULATORY_CARE_PROVIDER_SITE_OTHER): Payer: Medicaid Other | Admitting: Family Medicine

## 2020-02-13 ENCOUNTER — Ambulatory Visit: Payer: Medicaid Other

## 2020-02-13 ENCOUNTER — Ambulatory Visit (HOSPITAL_COMMUNITY)
Admission: RE | Admit: 2020-02-13 | Discharge: 2020-02-13 | Disposition: A | Discharge status: Home / Self Care | Payer: Medicaid Other | Source: Ambulatory Visit | Attending: Family Medicine | Admitting: Family Medicine

## 2020-02-13 VITALS — Ht 65.0 in | Wt 213.2 lb

## 2020-02-13 DIAGNOSIS — R55 Syncope and collapse: Secondary | ICD-10-CM | POA: Insufficient documentation

## 2020-02-13 NOTE — Patient Instructions (Signed)
Thank you for coming to see me today. It was a pleasure. Today we talked about:   Referral to Neurology. EEG to be scheduled and will call you with appointment Blood work today and will call you with results if abnormal.  No driving until seen by Neurology  Please follow-up with PCP in 4 weeks  If you have any questions or concerns, please do not hesitate to call the office at [phone].  Best,   Dana Allan, MD Family Medicine Residency

## 2020-02-17 ENCOUNTER — Encounter: Payer: Self-pay | Admitting: Family Medicine

## 2020-02-17 NOTE — Progress Notes (Signed)
    SUBJECTIVE:   CHIEF COMPLAINT / HPI: blackouts  Syncope Patient reports last syncopal episode in April.  She has had multiple syncopal episodes in the past but reports this one was different.  She describes previous episode with falling however this new one was witnessed by a friend.  The friend reports that patient passed out for 5 mins while standing braiding hair.  She then came to and continued to braid.  About 20 mins later, friend reports that patient was starting to lean on her so she helped her to the couch where it was reports patient was passed out for 1 hr.  No medical treatment at that time.  Patient has been very frustrated not being able to get answers and reports that it is always linked to her migraines.  Last CT head 2019 year ago and negative for any intracranial abnormalities.  Was seen by Dr Lucia Gaskins, Neurologist 2019  and was referred to [facility] Neuro but was denied.  PERTINENT  PMH / PSH:  Migraines  OBJECTIVE:   Ht 5\' 5"  (1.651 m)   Wt 213 lb 3.2 oz (96.7 kg)   SpO2 98%   BMI 35.48 kg/m   General: Alert and oriented, no apparent distress  Eyes: PEERLA ENTM: No pharyengeal erythema Neck: nontender Cardiovascular: RRR with no murmurs noted Respiratory: CTA bilaterally  Gastrointestinal: Bowel sounds present. No abdominal pain MSK: Upper extremity strength 5/5 bilaterally, Lower extremity strength 5/5 bilaterally  Neuro: CN III-XII intact, motor, sensory and gait wnl Psych: Anxious Behavior and speech appropriate to situation  ASSESSMENT/PLAN:   Syncopal episodes Unclear etiology for syncopal episodes.  ECG wnl, last CT head wnl.  Considered seizure activity, although patient has no known history.  Also considered migraines as possible etiology. Not related to pain so less likely vaso vagal.  -Bilateral Orthostatics and HR wnl -ECG -CBC, Bmet, Topiramate level today -Consult neuro for EEG -Consider 24 hr BP monitoring if continues to have syncopal  episodes -Advised not to drive until further evaluation completed -Follow up with PCP in 4 weeks or sooner if needed     , MD [facility]

## 2020-02-17 NOTE — Assessment & Plan Note (Addendum)
Unclear etiology for syncopal episodes.  ECG wnl, last CT head wnl.  Considered seizure activity, although patient has no known history.  Also considered migraines as possible etiology. Not related to pain so less likely vaso vagal.  -Bilateral Orthostatics and HR wnl -ECG -CBC, Bmet, Topiramate level today -Consult neuro for EEG -Consider 24 hr BP monitoring if continues to have syncopal episodes -Advised not to drive until further evaluation completed -Follow up with PCP in 4 weeks or sooner if needed

## 2020-02-18 ENCOUNTER — Other Ambulatory Visit: Payer: Self-pay

## 2020-02-18 ENCOUNTER — Ambulatory Visit (INDEPENDENT_AMBULATORY_CARE_PROVIDER_SITE_OTHER): Payer: Self-pay

## 2020-02-18 DIAGNOSIS — R55 Syncope and collapse: Secondary | ICD-10-CM | POA: Diagnosis not present

## 2020-02-18 DIAGNOSIS — I8393 Asymptomatic varicose veins of bilateral lower extremities: Secondary | ICD-10-CM

## 2020-02-18 NOTE — Progress Notes (Signed)
Treated pt's bilateral leg spider and small reticular veins with Asclera 1%. Pt rec'd a total of 4 mL. Pt has had ongoing pain in her legs and throughout her body for several years and has been seeing pain management. She stated her boyfriend was pressing on back of her L leg (behind knee) yesterday and it was very sore. She is unsure why. She was very uncomfortable throughout the treatment and would jump and moan with each injection. Jonna Clark, LPN was present for treatment and encouraged pt to deep breath throughout. After tx pt was put in thigh high compression stockings. She sat on side of bed and c/o feeling lightheaded and then nauseas. She was given ginger ale and offered crackers. Pt sat for about 15 min and then stated she needed to use restroom. Jonna Clark and I took her in wheelchair to restroom and offered for [place] to stay with her or to get her boyfriend who was waiting in lobby. She requested we stay instead of getting her boyfriend. We remained in the restroom with her while she attempted to use it but said she was not able to. We then wheeled her out to check out to pay. We discussed the likelihood of needing further treatment(s) and pt verbalized understanding. She was given post procedure instructions on both handout and verbally. Will follow PRN.

## 2020-02-19 LAB — CBC WITH DIFFERENTIAL/PLATELET
Basophils Absolute: 0 10*3/uL (ref 0.0–0.2)
Basos: 0 %
EOS (ABSOLUTE): 0.2 10*3/uL (ref 0.0–0.4)
Eos: 3 %
Hematocrit: 37.3 % (ref 34.0–46.6)
Hemoglobin: 13.3 g/dL (ref 11.1–15.9)
Immature Grans (Abs): 0 10*3/uL (ref 0.0–0.1)
Immature Granulocytes: 0 %
Lymphocytes Absolute: 2.1 10*3/uL (ref 0.7–3.1)
Lymphs: 46 %
MCH: 31.1 pg (ref 26.6–33.0)
MCHC: 35.7 g/dL (ref 31.5–35.7)
MCV: 87 fL (ref 79–97)
Monocytes Absolute: 0.5 10*3/uL (ref 0.1–0.9)
Monocytes: 11 %
Neutrophils Absolute: 1.8 10*3/uL (ref 1.4–7.0)
Neutrophils: 40 %
Platelets: 213 10*3/uL (ref 150–450)
RBC: 4.28 x10E6/uL (ref 3.77–5.28)
RDW: 13.1 % (ref 11.7–15.4)
WBC: 4.6 10*3/uL (ref 3.4–10.8)

## 2020-02-19 LAB — BASIC METABOLIC PANEL
BUN/Creatinine Ratio: 15 (ref 9–23)
BUN: 16 mg/dL (ref 6–24)
CO2: 26 mmol/L (ref 20–29)
Calcium: 8.9 mg/dL (ref 8.7–10.2)
Chloride: 101 mmol/L (ref 96–106)
Creatinine, Ser: 1.06 mg/dL — ABNORMAL HIGH (ref 0.57–1.00)
GFR calc Af Amer: 68 mL/min/{1.73_m2} (ref >59)
GFR calc non Af Amer: 59 mL/min/{1.73_m2} — ABNORMAL LOW (ref >59)
Glucose: 128 mg/dL — ABNORMAL HIGH (ref 65–99)
Potassium: 3 mmol/L — ABNORMAL LOW (ref 3.5–5.2)
Sodium: 140 mmol/L (ref 134–144)

## 2020-02-19 LAB — TOPIRAMATE LEVEL: Topiramate Lvl: 6.1 ug/mL (ref 2.0–25.0)

## 2020-02-20 ENCOUNTER — Encounter: Payer: Self-pay | Admitting: Family Medicine

## 2020-02-20 DIAGNOSIS — Z419 Encounter for procedure for purposes other than remedying health state, unspecified: Secondary | ICD-10-CM | POA: Diagnosis not present

## 2020-02-20 MED ORDER — POTASSIUM CHLORIDE ER 10 MEQ PO TBCR: 20.0000 meq | EXTENDED_RELEASE_TABLET | Freq: Every day | ORAL | 0 refills | Status: AC

## 2020-02-22 DIAGNOSIS — M25562 Pain in left knee: Secondary | ICD-10-CM | POA: Diagnosis not present

## 2020-02-26 ENCOUNTER — Ambulatory Visit (HOSPITAL_COMMUNITY)
Admission: RE | Admit: 2020-02-26 | Discharge: 2020-02-26 | Disposition: A | Discharge status: Home / Self Care | Payer: Medicaid Other | Source: Ambulatory Visit | Attending: Family Medicine | Admitting: Family Medicine

## 2020-02-26 ENCOUNTER — Ambulatory Visit (HOSPITAL_COMMUNITY): Payer: Medicaid Other

## 2020-02-26 ENCOUNTER — Other Ambulatory Visit: Payer: Self-pay

## 2020-02-26 DIAGNOSIS — R0989 Other specified symptoms and signs involving the circulatory and respiratory systems: Secondary | ICD-10-CM | POA: Diagnosis not present

## 2020-02-26 DIAGNOSIS — M79609 Pain in unspecified limb: Secondary | ICD-10-CM | POA: Diagnosis not present

## 2020-02-26 DIAGNOSIS — M542 Cervicalgia: Secondary | ICD-10-CM | POA: Diagnosis not present

## 2020-02-26 DIAGNOSIS — M5137 Other intervertebral disc degeneration, lumbosacral region: Secondary | ICD-10-CM | POA: Diagnosis not present

## 2020-02-26 DIAGNOSIS — R9401 Abnormal electroencephalogram [EEG]: Secondary | ICD-10-CM | POA: Insufficient documentation

## 2020-02-26 DIAGNOSIS — Z79899 Other long term (current) drug therapy: Secondary | ICD-10-CM | POA: Diagnosis not present

## 2020-02-26 DIAGNOSIS — M25511 Pain in right shoulder: Secondary | ICD-10-CM | POA: Diagnosis not present

## 2020-02-26 DIAGNOSIS — R7303 Prediabetes: Secondary | ICD-10-CM | POA: Diagnosis not present

## 2020-02-26 DIAGNOSIS — M545 Low back pain: Secondary | ICD-10-CM | POA: Diagnosis not present

## 2020-02-26 DIAGNOSIS — G894 Chronic pain syndrome: Secondary | ICD-10-CM | POA: Diagnosis not present

## 2020-02-26 DIAGNOSIS — G4089 Other seizures: Secondary | ICD-10-CM | POA: Diagnosis present

## 2020-02-26 DIAGNOSIS — Z6831 Body mass index (BMI) 31.0-31.9, adult: Secondary | ICD-10-CM | POA: Diagnosis not present

## 2020-02-26 DIAGNOSIS — R569 Unspecified convulsions: Secondary | ICD-10-CM | POA: Diagnosis not present

## 2020-02-26 DIAGNOSIS — Z791 Long term (current) use of non-steroidal anti-inflammatories (NSAID): Secondary | ICD-10-CM | POA: Diagnosis not present

## 2020-02-26 DIAGNOSIS — R55 Syncope and collapse: Secondary | ICD-10-CM | POA: Diagnosis not present

## 2020-02-26 DIAGNOSIS — G629 Polyneuropathy, unspecified: Secondary | ICD-10-CM | POA: Diagnosis not present

## 2020-02-26 DIAGNOSIS — M25512 Pain in left shoulder: Secondary | ICD-10-CM | POA: Diagnosis not present

## 2020-02-26 NOTE — Progress Notes (Signed)
EEG complete - results pending 

## 2020-02-27 NOTE — Procedures (Signed)
  [facility] Kirsten Walsh A. Gerilyn Pilgrim, MD     [email or web address]           HISTORY: This is a 59 year old who presents with recurrent spells suspicious for nonconvulsive seizures.  MEDICATIONS:  Current Outpatient Medications:  .  butalbital-acetaminophen-caffeine (FIORICET, ESGIC) 50-325-40 MG tablet, Take 1 tablet by mouth every 6 (six) hours as needed for headache or migraine., Disp: , Rfl:  .  celecoxib (CELEBREX) 100 MG capsule, Take 1 capsule (100 mg total) by mouth 2 (two) times daily., Disp: 30 capsule, Rfl: 0 .  cloNIDine (CATAPRES) 0.1 MG tablet, TAKE 1 TABLET(0.1 MG) BY MOUTH TWICE DAILY, Disp: 180 tablet, Rfl: 2 .  cyclobenzaprine (FLEXERIL) 5 MG tablet, Take 1-2 tablets (5-10 mg total) by mouth 2 (two) times daily as needed for muscle spasms., Disp: 24 tablet, Rfl: 0 .  hydrochlorothiazide (HYDRODIURIL) 25 MG tablet, TAKE 1 TABLET(25 MG) BY MOUTH DAILY, Disp: 90 tablet, Rfl: 2 .  ondansetron (ZOFRAN) 4 MG tablet, Take 1 tablet (4 mg total) by mouth every 6 (six) hours. (Patient taking differently: Take 4 mg by mouth as needed for nausea or vomiting. ), Disp: 12 tablet, Rfl: 0 .  oxyCODONE (OXY IR/ROXICODONE) 5 MG immediate release tablet, Take 5 mg by mouth every 4 (four) hours as needed for severe pain., Disp: , Rfl:  .  PHENTERMINE HCL PO, Take by mouth., Disp: , Rfl:  .  polyethylene glycol (MIRALAX / GLYCOLAX) 17 g packet, Take 17 g by mouth daily., Disp: , Rfl:  .  potassium chloride (KLOR-CON) 10 MEQ tablet, Take 2 tablets (20 mEq total) by mouth daily., Disp: 60 tablet, Rfl: 0 .  promethazine (PHENERGAN) 25 MG tablet, Take 1 tablet (25 mg total) by mouth every 6 (six) hours as needed for nausea or vomiting., Disp: 30 tablet, Rfl: 0 .  topiramate (TOPAMAX) 100 MG tablet, TAKE 1 AND 1/2 TABLETS(150 MG) BY MOUTH TWICE DAILY, Disp: 270 tablet, Rfl: 2     ANALYSIS: A 16 channel recording using standard 10 20 measurements is conducted for 28 minutes.  There is a  well-formed posterior dominant rhythm of 8 hertz which attenuates with eye opening. There is beta activity observed in frontal areas. Awake and drowsy architecture are observed which transitions to stage II non-REM sleep characterized by K complexes and spindles. Photic stimulation and hyperventilation are carried out without abnormal changes in the background activity. Patient did have 1 episode of blinking associated with general delta rhythmic activity but no clear associated epileptiform discharges. No other epileptiform discharges are observed. No focal or lateral slowing is documented.   IMPRESSION: 1.  Single episode of blinking associated with rhythmic generalized delta activity of unclear significance. Otherwise no clear epileptiform discharges noted.      Allysa Governale A. Gerilyn Pilgrim, M.D.  Diplomate, Biomedical engineer of Psychiatry and Neurology ( Neurology).

## 2020-02-29 ENCOUNTER — Ambulatory Visit: Payer: Medicaid Other | Admitting: Family Medicine

## 2020-02-29 ENCOUNTER — Emergency Department (HOSPITAL_COMMUNITY): Payer: Medicaid Other

## 2020-02-29 ENCOUNTER — Emergency Department (HOSPITAL_COMMUNITY)
Admission: EM | Admit: 2020-02-29 | Discharge: 2020-02-29 | Disposition: A | Discharge status: Home / Self Care | Payer: Medicaid Other | Attending: Emergency Medicine | Admitting: Emergency Medicine

## 2020-02-29 ENCOUNTER — Other Ambulatory Visit: Payer: Self-pay

## 2020-02-29 DIAGNOSIS — R569 Unspecified convulsions: Secondary | ICD-10-CM | POA: Diagnosis not present

## 2020-02-29 DIAGNOSIS — E876 Hypokalemia: Secondary | ICD-10-CM | POA: Insufficient documentation

## 2020-02-29 DIAGNOSIS — R55 Syncope and collapse: Secondary | ICD-10-CM | POA: Insufficient documentation

## 2020-02-29 DIAGNOSIS — R519 Headache, unspecified: Secondary | ICD-10-CM | POA: Diagnosis not present

## 2020-02-29 DIAGNOSIS — Y31XXXA Falling, lying or running before or into moving object, undetermined intent, initial encounter: Secondary | ICD-10-CM | POA: Insufficient documentation

## 2020-02-29 DIAGNOSIS — Y999 Unspecified external cause status: Secondary | ICD-10-CM | POA: Insufficient documentation

## 2020-02-29 DIAGNOSIS — Y9389 Activity, other specified: Secondary | ICD-10-CM | POA: Insufficient documentation

## 2020-02-29 DIAGNOSIS — Y92510 Bank as the place of occurrence of the external cause: Secondary | ICD-10-CM | POA: Insufficient documentation

## 2020-02-29 DIAGNOSIS — R457 State of emotional shock and stress, unspecified: Secondary | ICD-10-CM | POA: Diagnosis not present

## 2020-02-29 DIAGNOSIS — M5489 Other dorsalgia: Secondary | ICD-10-CM | POA: Diagnosis not present

## 2020-02-29 DIAGNOSIS — M25552 Pain in left hip: Secondary | ICD-10-CM | POA: Diagnosis not present

## 2020-02-29 DIAGNOSIS — I10 Essential (primary) hypertension: Secondary | ICD-10-CM | POA: Insufficient documentation

## 2020-02-29 DIAGNOSIS — M542 Cervicalgia: Secondary | ICD-10-CM | POA: Insufficient documentation

## 2020-02-29 DIAGNOSIS — M25551 Pain in right hip: Secondary | ICD-10-CM | POA: Diagnosis not present

## 2020-02-29 DIAGNOSIS — S3992XA Unspecified injury of lower back, initial encounter: Secondary | ICD-10-CM | POA: Diagnosis present

## 2020-02-29 DIAGNOSIS — S300XXA Contusion of lower back and pelvis, initial encounter: Secondary | ICD-10-CM

## 2020-02-29 DIAGNOSIS — Z043 Encounter for examination and observation following other accident: Secondary | ICD-10-CM | POA: Diagnosis not present

## 2020-02-29 DIAGNOSIS — H53149 Visual discomfort, unspecified: Secondary | ICD-10-CM | POA: Diagnosis not present

## 2020-02-29 DIAGNOSIS — Z79899 Other long term (current) drug therapy: Secondary | ICD-10-CM | POA: Diagnosis not present

## 2020-02-29 DIAGNOSIS — M545 Low back pain: Secondary | ICD-10-CM | POA: Diagnosis not present

## 2020-02-29 DIAGNOSIS — T679XXA Effect of heat and light, unspecified, initial encounter: Secondary | ICD-10-CM | POA: Diagnosis not present

## 2020-02-29 LAB — CBC WITH DIFFERENTIAL/PLATELET
Abs Immature Granulocytes: 0.03 10*3/uL (ref 0.00–0.07)
Basophils Absolute: 0 10*3/uL (ref 0.0–0.1)
Basophils Relative: 0 %
Eosinophils Absolute: 0 10*3/uL (ref 0.0–0.5)
Eosinophils Relative: 1 %
HCT: 44.7 % (ref 36.0–46.0)
Hemoglobin: 14.6 g/dL (ref 12.0–15.0)
Immature Granulocytes: 1 %
Lymphocytes Relative: 32 %
Lymphs Abs: 1.9 10*3/uL (ref 0.7–4.0)
MCH: 29.7 pg (ref 26.0–34.0)
MCHC: 32.7 g/dL (ref 30.0–36.0)
MCV: 91 fL (ref 80.0–100.0)
Monocytes Absolute: 0.5 10*3/uL (ref 0.1–1.0)
Monocytes Relative: 9 %
Neutro Abs: 3.5 10*3/uL (ref 1.7–7.7)
Neutrophils Relative %: 57 %
Platelets: 234 10*3/uL (ref 150–400)
RBC: 4.91 MIL/uL (ref 3.87–5.11)
RDW: 13.4 % (ref 11.5–15.5)
WBC: 5.9 10*3/uL (ref 4.0–10.5)
nRBC: 0 % (ref 0.0–0.2)

## 2020-02-29 LAB — COMPREHENSIVE METABOLIC PANEL
ALT: 25 U/L (ref 0–44)
AST: 20 U/L (ref 15–41)
Albumin: 3.9 g/dL (ref 3.5–5.0)
Alkaline Phosphatase: 100 U/L (ref 38–126)
Anion gap: 13 (ref 5–15)
BUN: 11 mg/dL (ref 6–20)
CO2: 23 mmol/L (ref 22–32)
Calcium: 9.3 mg/dL (ref 8.9–10.3)
Chloride: 99 mmol/L (ref 98–111)
Creatinine, Ser: 1.03 mg/dL — ABNORMAL HIGH (ref 0.44–1.00)
GFR calc Af Amer: >60 mL/min (ref >60)
GFR calc non Af Amer: >60 mL/min (ref >60)
Glucose, Bld: 123 mg/dL — ABNORMAL HIGH (ref 70–99)
Potassium: 3.2 mmol/L — ABNORMAL LOW (ref 3.5–5.1)
Sodium: 135 mmol/L (ref 135–145)
Total Bilirubin: 0.9 mg/dL (ref 0.3–1.2)
Total Protein: 7.3 g/dL (ref 6.5–8.1)

## 2020-02-29 LAB — MAGNESIUM: Magnesium: 2 mg/dL (ref 1.7–2.4)

## 2020-02-29 MED ORDER — DIPHENHYDRAMINE HCL 50 MG/ML IJ SOLN
25.0000 mg | Freq: Once | INTRAMUSCULAR | Status: AC
  Administered 2020-02-29: 25 mg via INTRAVENOUS
  Filled 2020-02-29: qty 1

## 2020-02-29 MED ORDER — SODIUM CHLORIDE 0.9 % IV BOLUS
500.0000 mL | Freq: Once | INTRAVENOUS | Status: AC
  Administered 2020-02-29: 500 mL via INTRAVENOUS

## 2020-02-29 MED ORDER — POTASSIUM CHLORIDE CRYS ER 20 MEQ PO TBCR
40.0000 meq | EXTENDED_RELEASE_TABLET | Freq: Once | ORAL | Status: AC
  Administered 2020-02-29: 40 meq via ORAL
  Filled 2020-02-29: qty 2

## 2020-02-29 MED ORDER — METOCLOPRAMIDE HCL 5 MG/ML IJ SOLN
10.0000 mg | Freq: Once | INTRAMUSCULAR | Status: AC
  Administered 2020-02-29: 10 mg via INTRAVENOUS
  Filled 2020-02-29: qty 2

## 2020-02-29 MED ORDER — OXYCODONE HCL 5 MG PO TABS
15.0000 mg | ORAL_TABLET | Freq: Once | ORAL | Status: AC
  Administered 2020-02-29: 15 mg via ORAL
  Filled 2020-02-29: qty 3

## 2020-02-29 MED ORDER — OXYCODONE-ACETAMINOPHEN 5-325 MG PO TABS
1.0000 | ORAL_TABLET | Freq: Once | ORAL | Status: AC
  Administered 2020-02-29: 1 via ORAL
  Filled 2020-02-29: qty 1

## 2020-02-29 NOTE — ED Notes (Signed)
Patient transported to X-ray 

## 2020-02-29 NOTE — ED Notes (Signed)
Pt ambulated in the hallway to the bathroom. Pt walked slowly with this NT as a one person assist. Pt seemed to lose her balance a few times and stated that she felt light headed.

## 2020-02-29 NOTE — ED Notes (Signed)
Patient transported to CT 

## 2020-02-29 NOTE — ED Provider Notes (Signed)
[facility] EMERGENCY DEPARTMENT Provider Note   CSN: [id number] Arrival date & time: 02/29/20  1542     History Chief Complaint  Patient presents with   syncopal episode    Kirsten Walsh is a 59 y.o. female.  The history is provided by the patient and medical records. No language interpreter was used.   Kirsten Walsh is a 59 y.o. female who presents to the Emergency Department complaining of syncope. She presents the emergency department by EMS for evaluation following syncopal episode. She states that she was talking to a friend at the bank had been feeling unwell. Sooner she is lying on the ground. She has a history of chronic daily migraines and states that since she passed out her migraine is worse. This is typical for her migraines just more intense. She also complains of pain to the back of her neck as well as her low back. She does have chronic low back pain but this is more intense than baseline. Pain is located in lower back and radiates bilaterally. She has pain when moving her legs bilaterally. She denies any fevers, vomiting, diarrhea. Symptoms are severe and constant nature.    Past Medical History:  Diagnosis Date   Chest pain    Chronic lower back pain    "right side to mid back" (07/06/2017)   Family history of adverse reaction to anesthesia    "daughter did; not sure happened"   Gall stones    History of anaphylaxis 06/26/2014   Hypertension    Kidney stone ~ 2006   Migraine    "qd" (07/06/2017)   Syncope and collapse 07/06/2017    sitting on the commode; developed nausea and passed out; hit head on shower and suffered a laceration; woke up in "a pool of blood"    Patient Active Problem List   Diagnosis Date Noted   Preoperative clearance 07/24/2019   Right arm pain 06/12/2019   Syncope 07/06/2017   Chronic daily headache 09/02/2015   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   Syncopal episodes 09/02/2015   Right arm  weakness 08/05/2015   Edema of right arm and right leg 09/07/2014   Intractable migraine with status migrainosus 06/10/2014   Chronic migraine without aura, intractable, with status migrainosus    Bradycardia 02/18/2013   Cardiomyopathy 10/20/2011   Leg swelling 09/23/2011   Complex regional pain syndrome of right lower extremity 05/03/2011   Chronic pain 03/29/2011   NECK PAIN, RIGHT 02/03/2010   ALLERGIC RHINITIS 01/30/2009   DIZZINESS 07/16/2008   Chronic migraine 04/26/2007   OBESITY, NOS 08/18/2006   HYPERTENSION, BENIGN SYSTEMIC 08/18/2006    Past Surgical History:  Procedure Laterality Date   CHONDROPLASTY Left 08/17/2019   Procedure: CHONDROPLASTY;  Surgeon: Frederico Hamman, MD;  Location: [facility];  Service: Orthopedics;  Laterality: Left;   CYSTOSCOPY W/ STONE MANIPULATION  ~ 2006   INTRAUTERINE DEVICE INSERTION     "initially put in in 04/2002; changed prn" (02/14/2013)   KNEE ARTHROSCOPY WITH LATERAL MENISECTOMY  08/17/2019   Procedure: KNEE ARTHROSCOPY WITH LATERAL MENISECTOMY;  Surgeon: Frederico Hamman, MD;  Location: [facility];  Service: Orthopedics;;   KNEE ARTHROSCOPY WITH MEDIAL MENISECTOMY Left 08/17/2019   Procedure: KNEE ARTHROSCOPY WITH MEDIAL MENISECTOMY;  Surgeon: Frederico Hamman, MD;  Location: [facility];  Service: Orthopedics;  Laterality: Left;   LAPAROSCOPIC CHOLECYSTECTOMY  ~ 2004     OB History   No obstetric history on file.  Family History  Problem Relation Age of Onset   Cancer Mother    Pancreatic cancer Mother    Cirrhosis Father    Lupus Sister    Diabetes Maternal Grandmother     Social History   Tobacco Use   Smoking status: Never Smoker   Smokeless tobacco: Never Used  Vaping Use   Vaping Use: Never used  Substance Use Topics   Alcohol use: Yes    Comment: 07/06/2017 "couple mixed drinks/month"   Drug use: No    Home Medications Prior to  Admission medications   Medication Sig Start Date End Date Taking? Authorizing Provider  butalbital-acetaminophen-caffeine (FIORICET, ESGIC) 50-325-40 MG tablet Take 1 tablet by mouth every 6 (six) hours as needed for headache or migraine.    [provider]  celecoxib (CELEBREX) 100 MG capsule Take 1 capsule (100 mg total) by mouth 2 (two) times daily. 04/30/19   Wieters, Hallie C, PA-C  cloNIDine (CATAPRES) 0.1 MG tablet TAKE 1 TABLET(0.1 MG) BY MOUTH TWICE DAILY 08/28/18   Howard Pouch, MD  cyclobenzaprine (FLEXERIL) 5 MG tablet Take 1-2 tablets (5-10 mg total) by mouth 2 (two) times daily as needed for muscle spasms. 04/30/19   Wieters, Hallie C, PA-C  hydrochlorothiazide (HYDRODIURIL) 25 MG tablet TAKE 1 TABLET(25 MG) BY MOUTH DAILY 08/28/18   Howard Pouch, MD  ondansetron (ZOFRAN) 4 MG tablet Take 1 tablet (4 mg total) by mouth every 6 (six) hours. Patient taking differently: Take 4 mg by mouth as needed for nausea or vomiting.  12/28/17   Wurst, [place], PA-C  oxyCODONE (OXY IR/ROXICODONE) 5 MG immediate release tablet Take 5 mg by mouth every 4 (four) hours as needed for severe pain.    [provider]  PHENTERMINE HCL PO Take by mouth.    [provider]  polyethylene glycol (MIRALAX / GLYCOLAX) 17 g packet Take 17 g by mouth daily.    [provider]  potassium chloride (KLOR-CON) 10 MEQ tablet Take 2 tablets (20 mEq total) by mouth daily. 02/20/20   Dana Allan, MD  promethazine (PHENERGAN) 25 MG tablet Take 1 tablet (25 mg total) by mouth every 6 (six) hours as needed for nausea or vomiting. 04/30/19   Wieters, Hallie C, PA-C  topiramate (TOPAMAX) 100 MG tablet TAKE 1 AND 1/2 TABLETS(150 MG) BY MOUTH TWICE DAILY 08/01/18   Howard Pouch, MD  potassium chloride SA (KLOR-CON) 20 MEQ tablet Take 1 tablet (20 mEq total) by mouth daily for 3 days. 04/05/19 04/30/19  Pricilla Loveless, MD  SUMAtriptan (IMITREX) 100 MG tablet Take 1 tablet (100 mg total) by mouth once. May  repeat in 2 hours if headache persists or recurs. 01/31/17 08/22/19  Almon Hercules, MD    Allergies    Apple, Aspirin, Carrot [daucus carota], and Haloperidol and related  Review of Systems   Review of Systems  All other systems reviewed and are negative.   Physical Exam Updated Vital Signs BP 122/89 (BP Location: Left Arm)    Pulse 79    Temp 98.3 F (36.8 C) (Oral)    Resp 18    SpO2 100%   Physical Exam Vitals and nursing note reviewed.  Constitutional:      Appearance: She is well-developed.  HENT:     Head: Normocephalic and atraumatic.     Comments: Photophobia. Pupils equal round and reactive. . Cardiovascular:     Rate and Rhythm: Normal rate and regular rhythm.     Heart sounds: No murmur heard.  Pulmonary:     Effort: Pulmonary effort is normal. No respiratory distress.     Breath sounds: Normal breath sounds.  Abdominal:     Palpations: Abdomen is soft.     Tenderness: There is no abdominal tenderness. There is no guarding or rebound.  Musculoskeletal:        General: No swelling.     Comments: 2+ DP pulses bilaterally. There is no discrete tenderness to palpation to the hips bilaterally but there is pain to the hips with range of motion bilaterally.  Skin:    General: Skin is warm and dry.  Neurological:     Mental Status: She is alert and oriented to person, place, and time.     Comments: Five out of five strength in all four extremities with sensation to light touch intact in all four extremities.  Psychiatric:        Behavior: Behavior normal.     ED Results / Procedures / Treatments   Labs (all labs ordered are listed, but only abnormal results are displayed) Labs Reviewed  COMPREHENSIVE METABOLIC PANEL - Abnormal; Notable for the following components:      Result Value   Potassium 3.2 (*)    Glucose, Bld 123 (*)    Creatinine, Ser 1.03 (*)    All other components within normal limits  CBC WITH DIFFERENTIAL/PLATELET  MAGNESIUM  URINALYSIS,  ROUTINE W REFLEX MICROSCOPIC    EKG EKG Interpretation  Date/Time:  Friday February 29 2020 17:24:07 EDT Ventricular Rate:  67 PR Interval:  170 QRS Duration: 82 QT Interval:  416 QTC Calculation: 439 R Axis:   -3 Text Interpretation: Normal sinus rhythm Normal ECG Confirmed by Tilden Fossa [phone]) on 02/29/2020 5:43:28 PM   Radiology DG Lumbar Spine Complete  Result Date: 02/29/2020 CLINICAL DATA:  Lower back and bilateral hip pain, fell, back pain EXAM: LUMBAR SPINE - COMPLETE 4+ VIEW COMPARISON:  04/30/2019 FINDINGS: Frontal, bilateral oblique, lateral views of the lumbar spine are obtained. There are 5 non-rib-bearing lumbar type vertebral bodies in anatomic alignment. No acute fractures. There is mild diffuse spondylosis unchanged since prior exam, most pronounced at L3-4. Sacroiliac joints are normal. IUD is seen within the pelvis. IMPRESSION: 1. Stable mild spondylosis.  No acute bony abnormality. Electronically Signed   By: Sharlet Salina M.D.   On: 02/29/2020 17:27   CT Head Wo Contrast  Result Date: 02/29/2020 CLINICAL DATA:  Syncopal episode.  Fell and hit head. EXAM: CT HEAD WITHOUT CONTRAST CT CERVICAL SPINE WITHOUT CONTRAST TECHNIQUE: Multidetector CT imaging of the head and cervical spine was performed following the standard protocol without intravenous contrast. Multiplanar CT image reconstructions of the cervical spine were also generated. COMPARISON:  Head CT 04/10/2018 FINDINGS: CT HEAD FINDINGS Brain: The ventricles are normal in size and configuration. Incidental cavum septum pellucidum et vergae. No extra-axial fluid collections are identified. The gray-white differentiation is maintained. No CT findings for acute hemispheric infarction or intracranial hemorrhage. No mass lesions. The brainstem and cerebellum are normal. Vascular: No hyperdense vessels or obvious aneurysm. Skull: No acute skull fracture.  No bone lesion. Sinuses/Orbits: The paranasal sinuses and  mastoid air cells are clear. The globes are intact. Other: No scalp lesions, laceration or hematoma. CT CERVICAL SPINE FINDINGS Alignment: Normal Skull base and vertebrae: No acute fracture. No primary bone lesion or focal pathologic process. Soft tissues and spinal canal: No prevertebral fluid or swelling. No visible canal hematoma. Disc levels: The spinal canal is generous. No large disc protrusions,  spinal or foraminal stenosis. Mild degenerative disc disease at C5-6 and C6-7. Upper chest: The lung apices are grossly clear. Other: Left thyroid goiter noted. Small nodules appears stable. The largest measures 13 x 13 mm. Not clinically significant; no follow-up imaging recommended (ref: J Am Coll Radiol. 2015 Feb;12(2): 143-50). IMPRESSION: 1. No acute intracranial findings or skull fracture. 2. Normal alignment of the cervical vertebral bodies and no acute cervical spine fracture. Electronically Signed   By: Rudie MeyerP.  Gallerani M.D.   On: 02/29/2020 19:17   CT Cervical Spine Wo Contrast  Result Date: 02/29/2020 CLINICAL DATA:  Syncopal episode.  Fell and hit head. EXAM: CT HEAD WITHOUT CONTRAST CT CERVICAL SPINE WITHOUT CONTRAST TECHNIQUE: Multidetector CT imaging of the head and cervical spine was performed following the standard protocol without intravenous contrast. Multiplanar CT image reconstructions of the cervical spine were also generated. COMPARISON:  Head CT 04/10/2018 FINDINGS: CT HEAD FINDINGS Brain: The ventricles are normal in size and configuration. Incidental cavum septum pellucidum et vergae. No extra-axial fluid collections are identified. The gray-white differentiation is maintained. No CT findings for acute hemispheric infarction or intracranial hemorrhage. No mass lesions. The brainstem and cerebellum are normal. Vascular: No hyperdense vessels or obvious aneurysm. Skull: No acute skull fracture.  No bone lesion. Sinuses/Orbits: The paranasal sinuses and mastoid air cells are clear. The globes  are intact. Other: No scalp lesions, laceration or hematoma. CT CERVICAL SPINE FINDINGS Alignment: Normal Skull base and vertebrae: No acute fracture. No primary bone lesion or focal pathologic process. Soft tissues and spinal canal: No prevertebral fluid or swelling. No visible canal hematoma. Disc levels: The spinal canal is generous. No large disc protrusions, spinal or foraminal stenosis. Mild degenerative disc disease at C5-6 and C6-7. Upper chest: The lung apices are grossly clear. Other: Left thyroid goiter noted. Small nodules appears stable. The largest measures 13 x 13 mm. Not clinically significant; no follow-up imaging recommended (ref: J Am Coll Radiol. 2015 Feb;12(2): 143-50). IMPRESSION: 1. No acute intracranial findings or skull fracture. 2. Normal alignment of the cervical vertebral bodies and no acute cervical spine fracture. Electronically Signed   By: Rudie MeyerP.  Gallerani M.D.   On: 02/29/2020 19:17   DG Hip Unilat W or Wo Pelvis 2-3 Views Left  Result Date: 02/29/2020 CLINICAL DATA:  Lower back pain, bilateral hip pain EXAM: DG HIP (WITH OR WITHOUT PELVIS) 2-3V LEFT; DG HIP (WITH OR WITHOUT PELVIS) 2-3V RIGHT COMPARISON:  None. FINDINGS: Frontal view of the pelvis as well as frontal and frogleg lateral views of both hips are obtained. On the right, there is no fracture, subluxation, or dislocation. Joint spaces are well preserved. On the left, there is no fracture, subluxation, or dislocation. Joint spaces are well preserved. The remainder of the bony pelvis is unremarkable. Sacroiliac joints are normal. IUD is identified. IMPRESSION: 1. Unremarkable pelvis and bilateral hips. Electronically Signed   By: Sharlet SalinaMichael  Brown M.D.   On: 02/29/2020 17:28   DG Hip Unilat W or Wo Pelvis 2-3 Views Right  Result Date: 02/29/2020 CLINICAL DATA:  Lower back pain, bilateral hip pain EXAM: DG HIP (WITH OR WITHOUT PELVIS) 2-3V LEFT; DG HIP (WITH OR WITHOUT PELVIS) 2-3V RIGHT COMPARISON:  None. FINDINGS: Frontal  view of the pelvis as well as frontal and frogleg lateral views of both hips are obtained. On the right, there is no fracture, subluxation, or dislocation. Joint spaces are well preserved. On the left, there is no fracture, subluxation, or dislocation. Joint spaces are well preserved.  The remainder of the bony pelvis is unremarkable. Sacroiliac joints are normal. IUD is identified. IMPRESSION: 1. Unremarkable pelvis and bilateral hips. Electronically Signed   By: Sharlet Salina M.D.   On: 02/29/2020 17:28    Procedures Procedures (including critical care time)  Medications Ordered in ED Medications  potassium chloride SA (KLOR-CON) CR tablet 40 mEq (has no administration in time range)  sodium chloride 0.9 % bolus 500 mL (500 mLs Intravenous New Bag/Given 02/29/20 1822)  metoCLOPramide (REGLAN) injection 10 mg (10 mg Intravenous Given 02/29/20 1821)  diphenhydrAMINE (BENADRYL) injection 25 mg (25 mg Intravenous Given 02/29/20 1822)  oxyCODONE-acetaminophen (PERCOCET/ROXICET) 5-325 MG per tablet 1 tablet (1 tablet Oral Given 02/29/20 1759)    ED Course  I have reviewed the triage vital signs and the nursing notes.  Pertinent labs & imaging results that were available during my care of the patient were reviewed by me and considered in my medical decision making (see chart for details).    MDM Rules/Calculators/A&P                         Patient with history of recurrent syncope, chronic daily migraines and low back pain here for evaluation following syncopal event with worsening of her migraine as well as worsening of her low back pain. On evaluation she does have photophobia but is non-toxic appearing with no focal neurologic deficits. Presentation is not consistent with subarachnoid hemorrhage, dural sinus thrombosis, meningitis. Imaging is negative for acute traumatic injury. Labs with mild hypokalemia, this was replaced orally. She was treated for headache as well as pain in the emergency  department. She does report that her headache is improving on repeat assessment. Plan to discharge home with home care for syncope, contusions, migraine.  Final Clinical Impression(s) / ED Diagnoses Final diagnoses:  Syncope, unspecified syncope type  Bad headache  Contusion of lower back, initial encounter  Hypokalemia    Rx / DC Orders ED Discharge Orders    None       Tilden Fossa, MD 02/29/20 2038

## 2020-02-29 NOTE — ED Triage Notes (Signed)
Pt arrives with [facility] from back; pt fell when exiting bank and had syncopal episode. Pt has hx of chronic syncope d/t heat and chronic nerve pain (that causes neck, back, and pain in extremities). Pt reports lower left back pain from syncopal episode. Pt a&o x4.   EMS vitals:  115/90 90 HR 100% O2 on RA CBG 120  97.7 temp

## 2020-02-29 NOTE — ED Notes (Signed)
Ready to be discharged one hour ago  Pt rep[orts that she  Is not light headed at home  Dr Madilyn Hook will evaluate her shortly

## 2020-03-02 NOTE — Progress Notes (Signed)
    SUBJECTIVE:   CHIEF COMPLAINT / HPI: for PAP  Last PAP 2018 wnl. Patient reports change in sexual partner since that time.  Denies any vaginal discharge, bleeding or dyspareunia.  No urinary symptoms or abdominal pain.  PERTINENT  PMH / PSH:  IUD   OBJECTIVE:   BP 100/60   Pulse 88   Ht 5\' 5"  (1.651 m)   Wt 203 lb 6.4 oz (92.3 kg)   SpO2 98%   BMI 33.85 kg/m    General: Alert and oriented, no apparent distress  EVE: no lesions or abrasions noted SVE: cervix and vaginal mucosa normal.  IUD stings visible Bimanual: no CMT or adnexal massess  ASSESSMENT/PLAN:   Cervical cancer screening Normal exam -PAP for cytology -GC/C -Wet mount negative -HIV, RPR today -Follow up with PCP as needed  Encounter for administration of vaccine COVID vaccine given today No Reaction after 15 mins Follow up in 4 weeks for second vaccine in Nurse clinic     , MD [facility]

## 2020-03-02 NOTE — Patient Instructions (Signed)
Thank you for coming to see me today. It was a pleasure.   I will MyChart you the results of your tests.   Please have your pain management records sent to the clinic  Please follow-up with PCP as needed  If you have any questions or concerns, please do not hesitate to call the office at [phone].  Best,   Dana Allan, MD Family Medicine Residency    Back Exercises These exercises help to make your trunk and back strong. They also help to keep the lower back flexible. Doing these exercises can help to prevent back pain or lessen existing pain.  If you have back pain, try to do these exercises 2-3 times each day or as told by your doctor.  As you get better, do the exercises once each day. Repeat the exercises more often as told by your doctor.  To stop back pain from coming back, do the exercises once each day, or as told by your doctor. Exercises Single knee to chest Do these steps 3-5 times in a row for each leg: 1. Lie on your back on a firm bed or the floor with your legs stretched out. 2. Bring one knee to your chest. 3. Grab your knee or thigh with both hands and hold them it in place. 4. Pull on your knee until you feel a gentle stretch in your lower back or buttocks. 5. Keep doing the stretch for 10-30 seconds. 6. Slowly let go of your leg and straighten it. Pelvic tilt Do these steps 5-10 times in a row: 1. Lie on your back on a firm bed or the floor with your legs stretched out. 2. Bend your knees so they point up to the ceiling. Your feet should be flat on the floor. 3. Tighten your lower belly (abdomen) muscles to press your lower back against the floor. This will make your tailbone point up to the ceiling instead of pointing down to your feet or the floor. 4. Stay in this position for 5-10 seconds while you gently tighten your muscles and breathe evenly. Cat-cow Do these steps until your lower back bends more easily: 1. Get on your hands and knees on a  firm surface. Keep your hands under your shoulders, and keep your knees under your hips. You may put padding under your knees. 2. Let your head hang down toward your chest. Tighten (contract) the muscles in your belly. Point your tailbone toward the floor so your lower back becomes rounded like the back of a cat. 3. Stay in this position for 5 seconds. 4. Slowly lift your head. Let the muscles of your belly relax. Point your tailbone up toward the ceiling so your back forms a sagging arch like the back of a cow. 5. Stay in this position for 5 seconds.  Press-ups Do these steps 5-10 times in a row: 1. Lie on your belly (face-down) on the floor. 2. Place your hands near your head, about shoulder-width apart. 3. While you keep your back relaxed and keep your hips on the floor, slowly straighten your arms to raise the top half of your body and lift your shoulders. Do not use your back muscles. You may change where you place your hands in order to make yourself more comfortable. 4. Stay in this position for 5 seconds. 5. Slowly return to lying flat on the floor.  Bridges Do these steps 10 times in a row: 1. Lie on your back on a firm surface. 2.  Bend your knees so they point up to the ceiling. Your feet should be flat on the floor. Your arms should be flat at your sides, next to your body. 3. Tighten your butt muscles and lift your butt off the floor until your waist is almost as high as your knees. If you do not feel the muscles working in your butt and the back of your thighs, slide your feet 1-2 inches farther away from your butt. 4. Stay in this position for 3-5 seconds. 5. Slowly lower your butt to the floor, and let your butt muscles relax. If this exercise is too easy, try doing it with your arms crossed over your chest. Belly crunches Do these steps 5-10 times in a row: 1. Lie on your back on a firm bed or the floor with your legs stretched out. 2. Bend your knees so they point up to the  ceiling. Your feet should be flat on the floor. 3. Cross your arms over your chest. 4. Tip your chin a little bit toward your chest but do not bend your neck. 5. Tighten your belly muscles and slowly raise your chest just enough to lift your shoulder blades a tiny bit off of the floor. Avoid raising your body higher than that, because it can put too much stress on your low back. 6. Slowly lower your chest and your head to the floor. Back lifts Do these steps 5-10 times in a row: 1. Lie on your belly (face-down) with your arms at your sides, and rest your forehead on the floor. 2. Tighten the muscles in your legs and your butt. 3. Slowly lift your chest off of the floor while you keep your hips on the floor. Keep the back of your head in line with the curve in your back. Look at the floor while you do this. 4. Stay in this position for 3-5 seconds. 5. Slowly lower your chest and your face to the floor. Contact a doctor if:  Your back pain gets a lot worse when you do an exercise.  Your back pain does not get better 2 hours after you exercise. If you have any of these problems, stop doing the exercises. Do not do them again unless your doctor says it is okay. Get help right away if:  You have sudden, very bad back pain. If this happens, stop doing the exercises. Do not do them again unless your doctor says it is okay. This information is not intended to replace advice given to you by your health care provider. Make sure you discuss any questions you have with your health care provider. Document Revised: 03/02/2018 Document Reviewed: 03/02/2018 Elsevier Patient Education  2020 [employer].

## 2020-03-04 ENCOUNTER — Ambulatory Visit (INDEPENDENT_AMBULATORY_CARE_PROVIDER_SITE_OTHER): Payer: Medicaid Other | Admitting: Family Medicine

## 2020-03-04 ENCOUNTER — Other Ambulatory Visit: Payer: Self-pay

## 2020-03-04 ENCOUNTER — Encounter: Payer: Self-pay | Admitting: Family Medicine

## 2020-03-04 ENCOUNTER — Other Ambulatory Visit (HOSPITAL_COMMUNITY)
Admission: RE | Admit: 2020-03-04 | Discharge: 2020-03-04 | Disposition: A | Discharge status: Home / Self Care | Payer: Medicaid Other | Source: Ambulatory Visit | Attending: Family Medicine | Admitting: Family Medicine

## 2020-03-04 VITALS — BP 100/60 | HR 88 | Ht 65.0 in | Wt 203.4 lb

## 2020-03-04 DIAGNOSIS — Z124 Encounter for screening for malignant neoplasm of cervix: Secondary | ICD-10-CM | POA: Insufficient documentation

## 2020-03-04 DIAGNOSIS — Z113 Encounter for screening for infections with a predominantly sexual mode of transmission: Secondary | ICD-10-CM

## 2020-03-04 DIAGNOSIS — Z23 Encounter for immunization: Secondary | ICD-10-CM

## 2020-03-04 LAB — POCT WET PREP (WET MOUNT)
Clue Cells Wet Prep Whiff POC: NEGATIVE
Trichomonas Wet Prep HPF POC: ABSENT
WBC, Wet Prep HPF POC: >20

## 2020-03-06 ENCOUNTER — Encounter: Payer: Self-pay | Admitting: Family Medicine

## 2020-03-06 LAB — CYTOLOGY - PAP
Chlamydia: NEGATIVE
Comment: NEGATIVE
Comment: NEGATIVE
Comment: NORMAL
Diagnosis: NEGATIVE
High risk HPV: NEGATIVE
Neisseria Gonorrhea: NEGATIVE

## 2020-03-07 DIAGNOSIS — R7303 Prediabetes: Secondary | ICD-10-CM | POA: Diagnosis not present

## 2020-03-07 DIAGNOSIS — G43909 Migraine, unspecified, not intractable, without status migrainosus: Secondary | ICD-10-CM | POA: Diagnosis not present

## 2020-03-07 DIAGNOSIS — G894 Chronic pain syndrome: Secondary | ICD-10-CM | POA: Diagnosis not present

## 2020-03-07 DIAGNOSIS — I10 Essential (primary) hypertension: Secondary | ICD-10-CM | POA: Diagnosis not present

## 2020-03-07 LAB — T PALLIDUM ANTIBODY, EIA: T pallidum Antibody, EIA: NEGATIVE

## 2020-03-07 LAB — RPR W/REFLEX TO TREPSURE: RPR: NONREACTIVE

## 2020-03-07 LAB — HIV ANTIBODY (ROUTINE TESTING W REFLEX): HIV Screen 4th Generation wRfx: NONREACTIVE

## 2020-03-09 ENCOUNTER — Encounter: Payer: Self-pay | Admitting: Family Medicine

## 2020-03-09 DIAGNOSIS — Z124 Encounter for screening for malignant neoplasm of cervix: Secondary | ICD-10-CM | POA: Insufficient documentation

## 2020-03-09 DIAGNOSIS — Z23 Encounter for immunization: Secondary | ICD-10-CM | POA: Insufficient documentation

## 2020-03-09 NOTE — Assessment & Plan Note (Deleted)
COVID vaccine given today No Reaction after 15 mins Follow up in 4 weeks for second vaccine in Nurse clinic 

## 2020-03-09 NOTE — Assessment & Plan Note (Signed)
Normal exam -PAP for cytology -GC/C -Wet mount negative -HIV, RPR today -Follow up with PCP as needed

## 2020-03-09 NOTE — Assessment & Plan Note (Signed)
COVID vaccine given today No Reaction after 15 mins Follow up in 4 weeks for second vaccine in Nurse clinic

## 2020-03-11 DIAGNOSIS — R7303 Prediabetes: Secondary | ICD-10-CM | POA: Diagnosis not present

## 2020-03-11 DIAGNOSIS — E1049 Type 1 diabetes mellitus with other diabetic neurological complication: Secondary | ICD-10-CM | POA: Diagnosis not present

## 2020-03-11 DIAGNOSIS — G894 Chronic pain syndrome: Secondary | ICD-10-CM | POA: Diagnosis not present

## 2020-03-11 DIAGNOSIS — M5137 Other intervertebral disc degeneration, lumbosacral region: Secondary | ICD-10-CM | POA: Diagnosis not present

## 2020-03-11 DIAGNOSIS — G501 Atypical facial pain: Secondary | ICD-10-CM | POA: Diagnosis not present

## 2020-03-11 DIAGNOSIS — M542 Cervicalgia: Secondary | ICD-10-CM | POA: Diagnosis not present

## 2020-03-11 DIAGNOSIS — M25511 Pain in right shoulder: Secondary | ICD-10-CM | POA: Diagnosis not present

## 2020-03-11 DIAGNOSIS — M25512 Pain in left shoulder: Secondary | ICD-10-CM | POA: Diagnosis not present

## 2020-03-11 DIAGNOSIS — M545 Low back pain: Secondary | ICD-10-CM | POA: Diagnosis not present

## 2020-03-11 DIAGNOSIS — G629 Polyneuropathy, unspecified: Secondary | ICD-10-CM | POA: Diagnosis not present

## 2020-03-18 DIAGNOSIS — I10 Essential (primary) hypertension: Secondary | ICD-10-CM | POA: Diagnosis not present

## 2020-03-18 DIAGNOSIS — I8391 Asymptomatic varicose veins of right lower extremity: Secondary | ICD-10-CM | POA: Diagnosis not present

## 2020-03-18 DIAGNOSIS — K219 Gastro-esophageal reflux disease without esophagitis: Secondary | ICD-10-CM | POA: Diagnosis not present

## 2020-03-25 ENCOUNTER — Ambulatory Visit (INDEPENDENT_AMBULATORY_CARE_PROVIDER_SITE_OTHER): Payer: Medicaid Other | Admitting: Family Medicine

## 2020-03-25 ENCOUNTER — Ambulatory Visit: Payer: Medicaid Other

## 2020-03-25 ENCOUNTER — Other Ambulatory Visit: Payer: Self-pay

## 2020-03-25 ENCOUNTER — Encounter: Payer: Self-pay | Admitting: Family Medicine

## 2020-03-25 VITALS — BP 150/92 | HR 105 | Ht 65.0 in | Wt 205.4 lb

## 2020-03-25 DIAGNOSIS — M25562 Pain in left knee: Secondary | ICD-10-CM

## 2020-03-25 DIAGNOSIS — Z23 Encounter for immunization: Secondary | ICD-10-CM | POA: Diagnosis not present

## 2020-03-25 DIAGNOSIS — G43711 Chronic migraine without aura, intractable, with status migrainosus: Secondary | ICD-10-CM | POA: Diagnosis not present

## 2020-03-25 NOTE — Progress Notes (Signed)
   Covid-19 Vaccination Clinic  Name:  Kirsten Walsh    MRN: [id number] DOB: [birth year]  03/25/2020   Patient presents to clinic today for second COVID vaccination. Administered in LD, site unremarkable, tolerated injection well.   [name] was observed post Covid-19 immunization for 30 minutes based on pre-vaccination screening without incident. She was provided with Vaccine Information Sheet and instruction to access the V-Safe system.   [name] was instructed to call 911 with any severe reactions post vaccine: Marland Kitchen Difficulty breathing  . Swelling of face and throat  . A fast heartbeat  . A bad rash all over body  . Dizziness and weakness    Provided patient with updated immunization record and card.   [name], RN

## 2020-03-25 NOTE — Patient Instructions (Signed)
It was wonderful to see you today.  I am sorry you are having these issues with your leg pain.  I have placed a referral for orthopedics and specifically asked that they not refer you to [facility].  Please reach out to [facility] orthopedics or whichever orthopedic office you wish and asked them about scheduling an appointment.  If you have any issues or concerns please feel free to call the clinic and schedule another appointment.  I hope you have a wonderful afternoon!

## 2020-03-25 NOTE — Progress Notes (Signed)
    SUBJECTIVE:   CHIEF COMPLAINT / HPI:  Patient presents to get her second dose of the Covid vaccine as well as has questions about her left knee.  Left knee pain Patient has longstanding history of chronic pain as well as bilateral lower extremity pain.  She had surgery previously this year on her left knee and reports that since the surgery her left knee pain has been worse and worse.  She reports that at a previous visit with our clinic she asked for a referral to a different orthopedist for further evaluation due to the increased pain.  She says that it hurts all of the time and there is very little she can do to decrease the pain.  She does see pain management and is satisfied with her care there.  Her big concern is that she has not heard from [facility] orthopedics regarding an appointment.   OBJECTIVE:   BP (!) 150/92   Pulse (!) 105   Ht 5\' 5"  (1.651 m)   Wt 205 lb 6.4 oz (93.2 kg)   SpO2 98%   BMI 34.18 kg/m   General: Well-appearing 59 year old female, intermittently frustrated regarding her chronic pain Cardiac: Regular rate and rhythm, no murmurs appreciated Respiratory: Normal work of breathing, lungs clear to auscultation bilaterally Abdomen: Soft, nontender, positive bowel sounds MSK: Patient has tenderness to light touch along both lower extremities.  She has point tenderness to palpation, extension of left knee.  Not much pain with flexion of left knee.  ASSESSMENT/PLAN:   Left knee pain Patient continues to have pain in her left knee.  She reports that it is worse than before having her surgery and would like to see a new orthopedist.  I am unable to see a referral that she says was previously placed.  Upon further investigation in [place] orthopedics already has the referral although it is not visible in our system.  While in the visit with me the patient called and scheduled an appointment with [facility] orthopedics -Continue pain management  recommendations -Patient to see orthopedics, appreciate their assistance with managing her knee pain     [facility], MD [facility]

## 2020-03-26 NOTE — Assessment & Plan Note (Signed)
Patient continues to have pain in her left knee.  She reports that it is worse than before having her surgery and would like to see a new orthopedist.  I am unable to see a referral that she says was previously placed.  Upon further investigation in [place] orthopedics already has the referral although it is not visible in our system.  While in the visit with me the patient called and scheduled an appointment with [facility] orthopedics -Continue pain management recommendations -Patient to see orthopedics, appreciate their assistance with managing her knee pain

## 2020-04-10 DIAGNOSIS — M25561 Pain in right knee: Secondary | ICD-10-CM | POA: Diagnosis not present

## 2020-04-10 DIAGNOSIS — M1712 Unilateral primary osteoarthritis, left knee: Secondary | ICD-10-CM | POA: Diagnosis not present

## 2020-04-16 DIAGNOSIS — M25512 Pain in left shoulder: Secondary | ICD-10-CM | POA: Diagnosis not present

## 2020-04-16 DIAGNOSIS — M545 Low back pain, unspecified: Secondary | ICD-10-CM | POA: Diagnosis not present

## 2020-04-16 DIAGNOSIS — M25511 Pain in right shoulder: Secondary | ICD-10-CM | POA: Diagnosis not present

## 2020-04-16 DIAGNOSIS — G629 Polyneuropathy, unspecified: Secondary | ICD-10-CM | POA: Diagnosis not present

## 2020-04-16 DIAGNOSIS — M170 Bilateral primary osteoarthritis of knee: Secondary | ICD-10-CM | POA: Diagnosis not present

## 2020-04-16 DIAGNOSIS — E1049 Type 1 diabetes mellitus with other diabetic neurological complication: Secondary | ICD-10-CM | POA: Diagnosis not present

## 2020-04-16 DIAGNOSIS — W2211XA Striking against or struck by driver side automobile airbag, initial encounter: Secondary | ICD-10-CM | POA: Diagnosis not present

## 2020-04-16 DIAGNOSIS — M5137 Other intervertebral disc degeneration, lumbosacral region: Secondary | ICD-10-CM | POA: Diagnosis not present

## 2020-04-16 DIAGNOSIS — R7303 Prediabetes: Secondary | ICD-10-CM | POA: Diagnosis not present

## 2020-04-16 DIAGNOSIS — G894 Chronic pain syndrome: Secondary | ICD-10-CM | POA: Diagnosis not present

## 2020-04-16 DIAGNOSIS — M542 Cervicalgia: Secondary | ICD-10-CM | POA: Diagnosis not present

## 2020-04-17 DIAGNOSIS — M25562 Pain in left knee: Secondary | ICD-10-CM | POA: Diagnosis not present

## 2020-04-17 DIAGNOSIS — R7303 Prediabetes: Secondary | ICD-10-CM | POA: Diagnosis not present

## 2020-04-17 DIAGNOSIS — Z6834 Body mass index (BMI) 34.0-34.9, adult: Secondary | ICD-10-CM | POA: Diagnosis not present

## 2020-04-17 DIAGNOSIS — I10 Essential (primary) hypertension: Secondary | ICD-10-CM | POA: Diagnosis not present

## 2020-04-17 DIAGNOSIS — G43909 Migraine, unspecified, not intractable, without status migrainosus: Secondary | ICD-10-CM | POA: Diagnosis not present

## 2020-04-20 DIAGNOSIS — S299XXA Unspecified injury of thorax, initial encounter: Secondary | ICD-10-CM | POA: Diagnosis not present

## 2020-04-20 DIAGNOSIS — Z0189 Encounter for other specified special examinations: Secondary | ICD-10-CM | POA: Diagnosis not present

## 2020-04-20 DIAGNOSIS — S8992XA Unspecified injury of left lower leg, initial encounter: Secondary | ICD-10-CM | POA: Diagnosis not present

## 2020-04-20 DIAGNOSIS — R000 Tachycardia, unspecified: Secondary | ICD-10-CM | POA: Diagnosis not present

## 2020-04-20 DIAGNOSIS — S199XXA Unspecified injury of neck, initial encounter: Secondary | ICD-10-CM | POA: Diagnosis not present

## 2020-04-20 DIAGNOSIS — G8911 Acute pain due to trauma: Secondary | ICD-10-CM | POA: Diagnosis not present

## 2020-04-20 DIAGNOSIS — M542 Cervicalgia: Secondary | ICD-10-CM | POA: Diagnosis not present

## 2020-04-20 DIAGNOSIS — Z79899 Other long term (current) drug therapy: Secondary | ICD-10-CM | POA: Diagnosis not present

## 2020-04-20 DIAGNOSIS — S4992XA Unspecified injury of left shoulder and upper arm, initial encounter: Secondary | ICD-10-CM | POA: Diagnosis not present

## 2020-04-20 DIAGNOSIS — S161XXA Strain of muscle, fascia and tendon at neck level, initial encounter: Secondary | ICD-10-CM | POA: Diagnosis not present

## 2020-04-20 DIAGNOSIS — S0990XA Unspecified injury of head, initial encounter: Secondary | ICD-10-CM | POA: Diagnosis not present

## 2020-04-21 DIAGNOSIS — Z419 Encounter for procedure for purposes other than remedying health state, unspecified: Secondary | ICD-10-CM | POA: Diagnosis not present

## 2020-04-26 ENCOUNTER — Ambulatory Visit (HOSPITAL_COMMUNITY)
Admission: EM | Admit: 2020-04-26 | Discharge: 2020-04-26 | Disposition: A | Discharge status: Home / Self Care | Payer: Medicaid Other | Attending: Family Medicine | Admitting: Family Medicine

## 2020-04-26 ENCOUNTER — Other Ambulatory Visit: Payer: Self-pay

## 2020-04-26 ENCOUNTER — Ambulatory Visit (INDEPENDENT_AMBULATORY_CARE_PROVIDER_SITE_OTHER): Payer: Medicaid Other

## 2020-04-26 ENCOUNTER — Encounter (HOSPITAL_COMMUNITY): Payer: Self-pay

## 2020-04-26 DIAGNOSIS — M25512 Pain in left shoulder: Secondary | ICD-10-CM | POA: Diagnosis not present

## 2020-04-26 DIAGNOSIS — R071 Chest pain on breathing: Secondary | ICD-10-CM

## 2020-04-26 DIAGNOSIS — M545 Low back pain, unspecified: Secondary | ICD-10-CM

## 2020-04-26 DIAGNOSIS — M898X1 Other specified disorders of bone, shoulder: Secondary | ICD-10-CM

## 2020-04-26 MED ORDER — DIAZEPAM 5 MG/ML IJ SOLN
5.0000 mg | Freq: Once | INTRAMUSCULAR | Status: AC
  Administered 2020-04-26: 5 mg via INTRAMUSCULAR

## 2020-04-26 MED ORDER — DEXAMETHASONE SODIUM PHOSPHATE 10 MG/ML IJ SOLN
10.0000 mg | Freq: Once | INTRAMUSCULAR | Status: AC
  Administered 2020-04-26: 10 mg via INTRAMUSCULAR

## 2020-04-26 MED ORDER — DIAZEPAM 5 MG PO TABS: 5.0000 mg | ORAL_TABLET | Freq: Two times a day (BID) | ORAL | 0 refills | Status: DC | PRN

## 2020-04-26 MED ORDER — DEXAMETHASONE SODIUM PHOSPHATE 10 MG/ML IJ SOLN
INTRAMUSCULAR | Status: AC
  Filled 2020-04-26: qty 1

## 2020-04-26 MED ORDER — DIAZEPAM 5 MG/ML IJ SOLN
INTRAMUSCULAR | Status: AC
  Filled 2020-04-26: qty 2
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
## 2020-04-26 NOTE — Discharge Instructions (Addendum)
Be aware, you have been prescribed pa medication that may cause drowsiness. Do not combine with alcohol or other illicit drugs. Please do not drive, operate heavy machinery, or take part in activities that require making important decisions while on this medication as your judgement may be clouded.

## 2020-04-28 DIAGNOSIS — M5459 Other low back pain: Secondary | ICD-10-CM | POA: Diagnosis not present

## 2020-04-28 DIAGNOSIS — I10 Essential (primary) hypertension: Secondary | ICD-10-CM | POA: Diagnosis not present

## 2020-04-28 DIAGNOSIS — G43909 Migraine, unspecified, not intractable, without status migrainosus: Secondary | ICD-10-CM | POA: Diagnosis not present

## 2020-05-07 DIAGNOSIS — M545 Low back pain, unspecified: Secondary | ICD-10-CM | POA: Diagnosis not present

## 2020-05-07 DIAGNOSIS — M25512 Pain in left shoulder: Secondary | ICD-10-CM | POA: Diagnosis not present

## 2020-05-07 DIAGNOSIS — M542 Cervicalgia: Secondary | ICD-10-CM | POA: Diagnosis not present

## 2020-05-09 DIAGNOSIS — M25562 Pain in left knee: Secondary | ICD-10-CM | POA: Diagnosis not present

## 2020-05-10 ENCOUNTER — Emergency Department (HOSPITAL_COMMUNITY)
Admission: EM | Admit: 2020-05-10 | Discharge: 2020-05-10 | Disposition: A | Discharge status: Home / Self Care | Payer: Medicaid Other | Attending: Emergency Medicine | Admitting: Emergency Medicine

## 2020-05-10 ENCOUNTER — Encounter (HOSPITAL_COMMUNITY): Payer: Self-pay

## 2020-05-10 ENCOUNTER — Emergency Department (HOSPITAL_COMMUNITY): Payer: Medicaid Other

## 2020-05-10 DIAGNOSIS — Z79899 Other long term (current) drug therapy: Secondary | ICD-10-CM | POA: Diagnosis not present

## 2020-05-10 DIAGNOSIS — M62838 Other muscle spasm: Secondary | ICD-10-CM | POA: Diagnosis not present

## 2020-05-10 DIAGNOSIS — M1712 Unilateral primary osteoarthritis, left knee: Secondary | ICD-10-CM | POA: Diagnosis not present

## 2020-05-10 DIAGNOSIS — M549 Dorsalgia, unspecified: Secondary | ICD-10-CM | POA: Diagnosis not present

## 2020-05-10 DIAGNOSIS — G43911 Migraine, unspecified, intractable, with status migrainosus: Secondary | ICD-10-CM | POA: Diagnosis not present

## 2020-05-10 DIAGNOSIS — M25561 Pain in right knee: Secondary | ICD-10-CM | POA: Diagnosis not present

## 2020-05-10 DIAGNOSIS — M25562 Pain in left knee: Secondary | ICD-10-CM | POA: Diagnosis not present

## 2020-05-10 DIAGNOSIS — W19XXXA Unspecified fall, initial encounter: Secondary | ICD-10-CM | POA: Diagnosis not present

## 2020-05-10 DIAGNOSIS — I10 Essential (primary) hypertension: Secondary | ICD-10-CM | POA: Diagnosis not present

## 2020-05-10 DIAGNOSIS — W1842XA Slipping, tripping and stumbling without falling due to stepping into hole or opening, initial encounter: Secondary | ICD-10-CM | POA: Diagnosis not present

## 2020-05-10 DIAGNOSIS — Z743 Need for continuous supervision: Secondary | ICD-10-CM | POA: Diagnosis not present

## 2020-05-10 DIAGNOSIS — M25519 Pain in unspecified shoulder: Secondary | ICD-10-CM | POA: Diagnosis not present

## 2020-05-10 DIAGNOSIS — G43901 Migraine, unspecified, not intractable, with status migrainosus: Secondary | ICD-10-CM

## 2020-05-10 DIAGNOSIS — M545 Low back pain, unspecified: Secondary | ICD-10-CM | POA: Insufficient documentation

## 2020-05-10 DIAGNOSIS — S8992XA Unspecified injury of left lower leg, initial encounter: Secondary | ICD-10-CM | POA: Diagnosis not present

## 2020-05-10 MED ORDER — IBUPROFEN 200 MG PO TABS
600.0000 mg | ORAL_TABLET | Freq: Once | ORAL | Status: DC
  Filled 2020-05-10: qty 3

## 2020-05-10 MED ORDER — FENTANYL CITRATE (PF) 100 MCG/2ML IJ SOLN
75.0000 ug | Freq: Once | INTRAMUSCULAR | Status: AC
  Administered 2020-05-10: 75 ug via INTRAVENOUS
  Filled 2020-05-10: qty 2

## 2020-05-10 MED ORDER — DIPHENHYDRAMINE HCL 50 MG/ML IJ SOLN
25.0000 mg | Freq: Once | INTRAMUSCULAR | Status: AC
  Administered 2020-05-10: 25 mg via INTRAVENOUS
  Filled 2020-05-10: qty 1

## 2020-05-10 MED ORDER — KETOROLAC TROMETHAMINE 15 MG/ML IJ SOLN
15.0000 mg | Freq: Once | INTRAMUSCULAR | Status: AC
  Administered 2020-05-10: 15 mg via INTRAVENOUS
  Filled 2020-05-10: qty 1

## 2020-05-10 MED ORDER — METOCLOPRAMIDE HCL 5 MG/ML IJ SOLN
10.0000 mg | Freq: Once | INTRAMUSCULAR | Status: AC
  Administered 2020-05-10: 10 mg via INTRAVENOUS
  Filled 2020-05-10: qty 2

## 2020-05-10 MED ORDER — MAGNESIUM SULFATE IN D5W 1-5 GM/100ML-% IV SOLN: 1.0000 g | Freq: Once | INTRAVENOUS | Status: DC

## 2020-05-10 MED ORDER — MORPHINE SULFATE 30 MG PO TABS
30.0000 mg | ORAL_TABLET | ORAL | Status: DC | PRN
  Filled 2020-05-10: qty 1

## 2020-05-10 MED ORDER — KETOROLAC TROMETHAMINE 30 MG/ML IJ SOLN
15.0000 mg | Freq: Once | INTRAMUSCULAR | Status: AC
  Administered 2020-05-10: 15 mg via INTRAVENOUS
  Filled 2020-05-10: qty 1

## 2020-05-10 MED ORDER — DIAZEPAM 2 MG PO TABS
2.0000 mg | ORAL_TABLET | Freq: Once | ORAL | Status: AC
  Administered 2020-05-10: 2 mg via ORAL
  Filled 2020-05-10: qty 1

## 2020-05-10 MED ORDER — HYDROCODONE-ACETAMINOPHEN 5-325 MG PO TABS
1.0000 | ORAL_TABLET | Freq: Once | ORAL | Status: DC
  Filled 2020-05-10: qty 1

## 2020-05-10 MED ORDER — DEXAMETHASONE SODIUM PHOSPHATE 10 MG/ML IJ SOLN
10.0000 mg | Freq: Once | INTRAMUSCULAR | Status: AC
  Administered 2020-05-10: 10 mg via INTRAVENOUS
  Filled 2020-05-10: qty 1

## 2020-05-10 NOTE — ED Notes (Signed)
Patient ambulated approximately 24ft until she had to stop due to knee and back pain. Patient relied on heavy assistance from staff and had to use a wheelchair in order to get back to her bed.

## 2020-05-10 NOTE — ED Provider Notes (Addendum)
[facility] Provider Note   CSN: [id number] Arrival date & time: 05/10/20  1219     History No chief complaint on file.   Kirsten Walsh is a 59 y.o. female.  HPI     59 year old comes in a chief complaint of fall.  Patient has history of chronic low back pain, spasms and she had a fall prior to ED arrival.  She reports that she was outside, when her leg got stuck and twisted and she stepped on uneven ground, and the next thing she recalls she was on the ground.  She had no chest pain, shortness of breath, palpitations.  She denies any history of CAD, family history of arrhythmia or sudden cardiac death.  Patient has had syncope in the past.  She denies any incontinence and her neighbor witnessed the syncope we did not tell her that there was seizure-like activity.  Patient denies any numbness, tingling, dizziness.  He is having migraine-like headaches.  From the fall itself patient is having pain in both of her knees, left worse than right and she is having spasms in her back.   Past Medical History:  Diagnosis Date  . Chest pain   . Chronic lower back pain    "right side to mid back" (07/06/2017)  . Family history of adverse reaction to anesthesia    "daughter did; not sure happened"  . Gall stones   . History of anaphylaxis 06/26/2014  . Hypertension   . Kidney stone ~ 2006  . Migraine    "qd" (07/06/2017)  . Syncope and collapse 07/06/2017    sitting on the commode; developed nausea and passed out; hit head on shower and suffered a laceration; woke up in "a pool of blood"    Patient Active Problem List   Diagnosis Date Noted  . Cervical cancer screening 03/09/2020  . Encounter for administration of vaccine 03/09/2020  . Preoperative clearance 07/24/2019  . Right arm pain 06/12/2019  . Syncope 07/06/2017  . Chronic daily headache 09/02/2015  . 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 Syncopal episodes 09/02/2015  . Right arm weakness  08/05/2015  . Edema of right arm and right leg 09/07/2014  . Intractable migraine with status migrainosus 06/10/2014  . Chronic migraine without aura, intractable, with status migrainosus   . Bradycardia 02/18/2013  . Left knee pain 01/16/2013  . Cardiomyopathy 10/20/2011  . Leg swelling 09/23/2011  . Complex regional pain syndrome of right lower extremity 05/03/2011  . Chronic pain 03/29/2011  . NECK PAIN, RIGHT 02/03/2010  . ALLERGIC RHINITIS 01/30/2009  . DIZZINESS 07/16/2008  . Chronic migraine 04/26/2007  . OBESITY, NOS 08/18/2006  . HYPERTENSION, BENIGN SYSTEMIC 08/18/2006    Past Surgical History:  Procedure Laterality Date  . CHONDROPLASTY Left 08/17/2019   Procedure: CHONDROPLASTY;  Surgeon: Frederico Hammanaffrey, Daniel, MD;  Location: [facility];  Service: Orthopedics;  Laterality: Left;  . CYSTOSCOPY W/ STONE MANIPULATION  ~ 2006  . INTRAUTERINE DEVICE INSERTION     "initially put in in 04/2002; changed prn" (02/14/2013)  . KNEE ARTHROSCOPY WITH LATERAL MENISECTOMY  08/17/2019   Procedure: KNEE ARTHROSCOPY WITH LATERAL MENISECTOMY;  Surgeon: Frederico Hammanaffrey, Daniel, MD;  Location:  [facility]ERY [facility];  Service: Orthopedics;;  . KNEE ARTHROSCOPY WITH MEDIAL MENISECTOMY Left 08/17/2019   Procedure: KNEE ARTHROSCOPY WITH MEDIAL MENISECTOMY;  Surgeon: Frederico Hammanaffrey, Daniel, MD;  Location:  SUR[facility]ERY CENTER;  Service: Orthopedics;  Laterality: Left;  . LAPAROSCOPIC CHOLECYSTECTOMY  ~ 2004     OB  History   No obstetric history on file.     Family History  Problem Relation Age of Onset  . Cancer Mother   . Pancreatic cancer Mother   . Cirrhosis Father   . Lupus Sister   . Diabetes Maternal Grandmother     Social History   Tobacco Use  . Smoking status: Never Smoker  . Smokeless tobacco: Never Used  Vaping Use  . Vaping Use: Never used  Substance Use Topics  . Alcohol use: Yes    Comment: 07/06/2017 "couple mixed drinks/month"  . Drug use: No    Home  Medications Prior to Admission medications   Medication Sig Start Date End Date Taking? Authorizing Provider  butalbital-acetaminophen-caffeine (FIORICET, ESGIC) 50-325-40 MG tablet Take 1 tablet by mouth every 6 (six) hours as needed for headache or migraine.    [provider]  celecoxib (CELEBREX) 100 MG capsule Take 1 capsule (100 mg total) by mouth 2 (two) times daily. 04/30/19   Wieters, Hallie C, PA-C  cloNIDine (CATAPRES) 0.1 MG tablet TAKE 1 TABLET(0.1 MG) BY MOUTH TWICE DAILY 08/28/18   Howard Pouch, MD  cyclobenzaprine (FLEXERIL) 5 MG tablet Take 1-2 tablets (5-10 mg total) by mouth 2 (two) times daily as needed for muscle spasms. 04/30/19   Wieters, Hallie C, PA-C  diazepam (VALIUM) 5 MG tablet Take 1 tablet (5 mg total) by mouth every 12 (twelve) hours as needed for muscle spasms. 04/26/20   Mardella Layman, MD  hydrochlorothiazide (HYDRODIURIL) 25 MG tablet TAKE 1 TABLET(25 MG) BY MOUTH DAILY 08/28/18   Howard Pouch, MD  ondansetron (ZOFRAN) 4 MG tablet Take 1 tablet (4 mg total) by mouth every 6 (six) hours. Patient taking differently: Take 4 mg by mouth as needed for nausea or vomiting.  12/28/17   Wurst, [place], PA-C  oxyCODONE (OXY IR/ROXICODONE) 5 MG immediate release tablet Take 5 mg by mouth every 4 (four) hours as needed for severe pain.    [provider]  PHENTERMINE HCL PO Take by mouth.    [provider]  polyethylene glycol (MIRALAX / GLYCOLAX) 17 g packet Take 17 g by mouth daily.    [provider]  potassium chloride (KLOR-CON) 10 MEQ tablet Take 2 tablets (20 mEq total) by mouth daily. 02/20/20   Dana Allan, MD  promethazine (PHENERGAN) 25 MG tablet Take 1 tablet (25 mg total) by mouth every 6 (six) hours as needed for nausea or vomiting. 04/30/19   Wieters, Hallie C, PA-C  topiramate (TOPAMAX) 100 MG tablet TAKE 1 AND 1/2 TABLETS(150 MG) BY MOUTH TWICE DAILY 08/01/18   Howard Pouch, MD  potassium chloride SA (KLOR-CON) 20 MEQ tablet Take 1  tablet (20 mEq total) by mouth daily for 3 days. 04/05/19 04/30/19  Pricilla Loveless, MD  SUMAtriptan (IMITREX) 100 MG tablet Take 1 tablet (100 mg total) by mouth once. May repeat in 2 hours if headache persists or recurs. 01/31/17 08/22/19  Almon Hercules, MD    Allergies    Apple, Aspirin, Carrot [daucus carota], and Haloperidol and related  Review of Systems   Review of Systems  Constitutional: Positive for activity change.  Respiratory: Negative for shortness of breath.   Cardiovascular: Negative for chest pain.  Musculoskeletal: Positive for arthralgias and back pain.  Allergic/Immunologic: Negative for immunocompromised state.  Neurological: Positive for headaches. Negative for dizziness.  Hematological: Does not bruise/bleed easily.  All other systems reviewed and are negative.   Physical Exam Updated Vital Signs BP 124/72   Pulse  62   Temp 97.8 F (36.6 C) (Oral)   Resp 18   Ht 5\' 5"  (1.651 m)   Wt 88.9 kg   SpO2 100%   BMI 32.62 kg/m   Physical Exam Vitals and nursing note reviewed.  Constitutional:      Appearance: She is well-developed.  HENT:     Head: Normocephalic and atraumatic.  Eyes:     Pupils: Pupils are equal, round, and reactive to light.  Neck:     Comments: No midline c-spine tenderness, pt able to turn head to 45 degrees bilaterally without any pain and able to flex neck to the chest and extend without any pain or neurologic symptoms. Cardiovascular:     Rate and Rhythm: Normal rate and regular rhythm.     Heart sounds: Normal heart sounds. No murmur heard.   Pulmonary:     Effort: Pulmonary effort is normal. No respiratory distress.  Abdominal:     General: There is no distension.     Palpations: Abdomen is soft.     Tenderness: There is no abdominal tenderness. There is no guarding or rebound.  Musculoskeletal:        General: Tenderness present. No deformity.     Cervical back: Neck supple.     Comments: Patient has tenderness over the  knees bilaterally and over her lower lumbar spine, mostly in the paraspinal region.  Otherwise:  Head to toe evaluation shows no hematoma, bleeding of the scalp, no facial abrasions, no spine step offs, crepitus of the chest or neck, no tenderness to palpation of the bilateral upper and lower extremities, no gross deformities, no chest tenderness, no pelvic pain.   Skin:    General: Skin is warm and dry.  Neurological:     Mental Status: She is alert and oriented to person, place, and time.     ED Results / Procedures / Treatments   Labs (all labs ordered are listed, but only abnormal results are displayed) Labs Reviewed - No data to display  EKG EKG Interpretation  Date/Time:  Saturday May 10 2020 14:24:05 EST Ventricular Rate:  67 PR Interval:  166 QRS Duration: 84 QT Interval:  422 QTC Calculation: 445 R Axis:   48 Text Interpretation: Normal sinus rhythm Low voltage QRS Nonspecific T wave abnormality Abnormal ECG No acute changes No significant change since last tracing Confirmed by 03-06-1993 [phone]) on 05/10/2020 2:55:46 PM   Radiology DG Knee Complete 4 Views Left  Result Date: 05/10/2020 CLINICAL DATA:  Recent fall, twisting injury and associated knee pain EXAM: LEFT KNEE - COMPLETE 4+ VIEW COMPARISON:  None. FINDINGS: Normal alignment without acute osseous finding or large effusion. Tricompartmental mild osteoarthritis with joint space loss, sclerosis and bony spurring. No definite soft tissue abnormality. IMPRESSION: Left knee tricompartmental osteoarthritis. No acute osseous finding Electronically Signed   By: 05/12/2020.  Shick M.D.   On: 05/10/2020 13:53    Procedures Procedures (including critical care time)  Medications Ordered in ED Medications  ibuprofen (ADVIL) tablet 600 mg (600 mg Oral Refused 05/10/20 1441)  HYDROcodone-acetaminophen (NORCO/VICODIN) 5-325 MG per tablet 1 tablet (1 tablet Oral Refused 05/10/20 1441)  morphine (MSIR) tablet 30 mg (has  no administration in time range)  fentaNYL (SUBLIMAZE) injection 75 mcg (has no administration in time range)  ketorolac (TORADOL) 30 MG/ML injection 15 mg (has no administration in time range)  metoCLOPramide (REGLAN) injection 10 mg (10 mg Intravenous Given 05/10/20 1528)  diphenhydrAMINE (BENADRYL) injection 25 mg (25 mg Intravenous  Given 05/10/20 1528)  ketorolac (TORADOL) 15 MG/ML injection 15 mg (15 mg Intravenous Given 05/10/20 1528)  dexamethasone (DECADRON) injection 10 mg (10 mg Intravenous Given 05/10/20 1705)  metoCLOPramide (REGLAN) injection 10 mg (10 mg Intravenous Given 05/10/20 1706)  diazepam (VALIUM) tablet 2 mg (2 mg Oral Given 05/10/20 1706)    ED Course  I have reviewed the triage vital signs and the nursing notes.  Pertinent labs & imaging results that were available during my care of the patient were reviewed by me and considered in my medical decision making (see chart for details).  Clinical Course as of May 11 1811  Sat May 10, 2020  1659 Patient continues to have spasmatic pain.  Initial cocktail did not relieve the migraine.  We will order dexamethasone and oral narcotic medication for her musculoskeletal pain.   [AN]  1659 Patient has ambulated to the bathroom.  Do not anticipate hip fracture at this time.   [AN]  1659 Nursing team is having difficulty with IV access.  No labs drawn.  IV has been established.  We will focus on symptom control.  I do not think labs will add value as we do not think there is blood loss anemia or significant electrolyte abnormalities.  EKG is reassuring which again makes clinically significant electrolyte abnormality less likely.   [AN]    Clinical Course User Index [AN] [name], MD   MDM Rules/Calculators/A&P                          59 year old comes in a chief complaint of fall It appears the patient lost consciousness and fell.  No seizure-like activity witnessed by the neighbor.  Patient denies incontinence.  No  history of seizure.  She had a syncope in the past.  No prodrome with this episode but patient has no cardiac history.  EKG ordered.  No significant risk factors for cardiovascular or neurologic disorder associated with the syncope.  We will put her on telemetry.  Basic labs ordered to ensure there is no significant abnormalities noted.  Otherwise we have gotten x-rays to ensure there is no fracture of the left knee, which is the most painful for her.  Brain and C-spine cleared clinically.  6:12 PM The patient appears reasonably screened and/or stabilized for discharge and I doubt any other medical condition or other [facility] requiring further screening, evaluation, or treatment in the ED at this time prior to discharge.   Results from the ER workup discussed with the patient face to face and all questions answered to the best of my ability. The patient is safe for discharge with strict return precautions.     Final Clinical Impression(s) / ED Diagnoses Final diagnoses:  Status migrainosus  Fall, initial encounter  Acute pain of both knees  Muscle spasm    Rx / DC Orders ED Discharge Orders    None        [name], MD 05/10/20 1812

## 2020-05-10 NOTE — ED Triage Notes (Signed)
Pt fell in yard after stepping into hole. Pt reports hearing a pop in her back as it twisted and she fell. Neighbors found patient on the ground trembling, where she remained for around 20 minutes before EMS arrival.  Pt has hx of muscle spasms. Reporting pain in cervical and lumbar area, pt refused c-collar. Pt unable to tolerate sitting position.   EMS vitals:  BP 130/78 HR 64 SPO2 98 % RA  RR 16  T 97.3

## 2020-05-10 NOTE — Discharge Instructions (Signed)
Xrays show arthritis of knee.  We are not sure what is causing your headaches, however, there appears to be no evidence of infection, bleeds or tumors based on our exam and results.  See your doctor if the pain persists, as you might need better medications or a specialist.

## 2020-05-10 NOTE — ED Notes (Signed)
Pt given discharge paperwork and verbalizes understanding of instructions  

## 2020-05-13 DIAGNOSIS — Z6831 Body mass index (BMI) 31.0-31.9, adult: Secondary | ICD-10-CM | POA: Diagnosis not present

## 2020-05-13 DIAGNOSIS — G894 Chronic pain syndrome: Secondary | ICD-10-CM | POA: Diagnosis not present

## 2020-05-13 DIAGNOSIS — M542 Cervicalgia: Secondary | ICD-10-CM | POA: Diagnosis not present

## 2020-05-13 DIAGNOSIS — M25511 Pain in right shoulder: Secondary | ICD-10-CM | POA: Diagnosis not present

## 2020-05-13 DIAGNOSIS — M545 Low back pain, unspecified: Secondary | ICD-10-CM | POA: Diagnosis not present

## 2020-05-13 DIAGNOSIS — G629 Polyneuropathy, unspecified: Secondary | ICD-10-CM | POA: Diagnosis not present

## 2020-05-13 DIAGNOSIS — E1049 Type 1 diabetes mellitus with other diabetic neurological complication: Secondary | ICD-10-CM | POA: Diagnosis not present

## 2020-05-13 DIAGNOSIS — M5137 Other intervertebral disc degeneration, lumbosacral region: Secondary | ICD-10-CM | POA: Diagnosis not present

## 2020-05-13 DIAGNOSIS — M170 Bilateral primary osteoarthritis of knee: Secondary | ICD-10-CM | POA: Diagnosis not present

## 2020-05-13 DIAGNOSIS — G501 Atypical facial pain: Secondary | ICD-10-CM | POA: Diagnosis not present

## 2020-05-13 DIAGNOSIS — M25512 Pain in left shoulder: Secondary | ICD-10-CM | POA: Diagnosis not present

## 2020-05-14 ENCOUNTER — Emergency Department (HOSPITAL_COMMUNITY): Payer: Medicaid Other

## 2020-05-14 ENCOUNTER — Other Ambulatory Visit: Payer: Self-pay

## 2020-05-14 ENCOUNTER — Encounter (HOSPITAL_COMMUNITY): Payer: Self-pay | Admitting: *Deleted

## 2020-05-14 ENCOUNTER — Emergency Department (HOSPITAL_COMMUNITY)
Admission: EM | Admit: 2020-05-14 | Discharge: 2020-05-14 | Disposition: A | Discharge status: Home / Self Care | Payer: Medicaid Other | Attending: Emergency Medicine | Admitting: Emergency Medicine

## 2020-05-14 DIAGNOSIS — Z79899 Other long term (current) drug therapy: Secondary | ICD-10-CM | POA: Diagnosis not present

## 2020-05-14 DIAGNOSIS — M25561 Pain in right knee: Secondary | ICD-10-CM | POA: Diagnosis not present

## 2020-05-14 DIAGNOSIS — Y9301 Activity, walking, marching and hiking: Secondary | ICD-10-CM | POA: Diagnosis not present

## 2020-05-14 DIAGNOSIS — Z743 Need for continuous supervision: Secondary | ICD-10-CM | POA: Diagnosis not present

## 2020-05-14 DIAGNOSIS — S199XXA Unspecified injury of neck, initial encounter: Secondary | ICD-10-CM | POA: Diagnosis not present

## 2020-05-14 DIAGNOSIS — W19XXXA Unspecified fall, initial encounter: Secondary | ICD-10-CM

## 2020-05-14 DIAGNOSIS — I10 Essential (primary) hypertension: Secondary | ICD-10-CM | POA: Diagnosis not present

## 2020-05-14 DIAGNOSIS — M47812 Spondylosis without myelopathy or radiculopathy, cervical region: Secondary | ICD-10-CM | POA: Diagnosis not present

## 2020-05-14 DIAGNOSIS — M545 Low back pain, unspecified: Secondary | ICD-10-CM | POA: Diagnosis not present

## 2020-05-14 DIAGNOSIS — W010XXA Fall on same level from slipping, tripping and stumbling without subsequent striking against object, initial encounter: Secondary | ICD-10-CM | POA: Insufficient documentation

## 2020-05-14 DIAGNOSIS — M549 Dorsalgia, unspecified: Secondary | ICD-10-CM | POA: Diagnosis not present

## 2020-05-14 MED ORDER — PROMETHAZINE HCL 25 MG PO TABS
12.5000 mg | ORAL_TABLET | Freq: Once | ORAL | Status: AC
  Administered 2020-05-14: 12.5 mg via ORAL
  Filled 2020-05-14: qty 1

## 2020-05-14 MED ORDER — OXYCODONE HCL 5 MG PO TABS
5.0000 mg | ORAL_TABLET | Freq: Once | ORAL | Status: AC
  Administered 2020-05-14: 5 mg via ORAL
  Filled 2020-05-14: qty 1

## 2020-05-14 MED ORDER — ONDANSETRON HCL 4 MG PO TABS
8.0000 mg | ORAL_TABLET | Freq: Once | ORAL | Status: DC
  Filled 2020-05-14: qty 2

## 2020-05-14 MED ORDER — LIDOCAINE 5 % EX PTCH
1.0000 | MEDICATED_PATCH | Freq: Once | CUTANEOUS | Status: DC
  Administered 2020-05-14: 1 via TRANSDERMAL
  Filled 2020-05-14: qty 1

## 2020-05-14 MED ORDER — LIDOCAINE 5 % EX PTCH: 1.0000 | MEDICATED_PATCH | CUTANEOUS | 0 refills | Status: DC
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
## 2020-05-14 NOTE — ED Notes (Signed)
Attempted to obtain VS.  Pt requesting to use the bathroom first.

## 2020-05-14 NOTE — ED Notes (Addendum)
Pt discharged but waiting for ride.

## 2020-05-14 NOTE — Discharge Instructions (Signed)
CT scan of your neck did not show any acute abnormalities, there is no fractures.  The x-ray of your back and right knee do not show any fractures or dislocations.  Continue home medications.  Follow-up with primary care doctor if you continue to have pain.
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
## 2020-05-19 DIAGNOSIS — M25512 Pain in left shoulder: Secondary | ICD-10-CM | POA: Diagnosis not present

## 2020-05-20 DIAGNOSIS — M170 Bilateral primary osteoarthritis of knee: Secondary | ICD-10-CM | POA: Diagnosis not present

## 2020-05-21 DIAGNOSIS — Z419 Encounter for procedure for purposes other than remedying health state, unspecified: Secondary | ICD-10-CM | POA: Diagnosis not present

## 2020-05-26 DIAGNOSIS — G894 Chronic pain syndrome: Secondary | ICD-10-CM | POA: Diagnosis not present

## 2020-05-26 DIAGNOSIS — R404 Transient alteration of awareness: Secondary | ICD-10-CM | POA: Diagnosis not present

## 2020-05-26 DIAGNOSIS — G43719 Chronic migraine without aura, intractable, without status migrainosus: Secondary | ICD-10-CM | POA: Diagnosis not present

## 2020-06-03 DIAGNOSIS — M1712 Unilateral primary osteoarthritis, left knee: Secondary | ICD-10-CM | POA: Diagnosis not present

## 2020-06-05 DIAGNOSIS — G43909 Migraine, unspecified, not intractable, without status migrainosus: Secondary | ICD-10-CM | POA: Diagnosis not present

## 2020-06-05 DIAGNOSIS — R7303 Prediabetes: Secondary | ICD-10-CM | POA: Diagnosis not present

## 2020-06-05 DIAGNOSIS — G4700 Insomnia, unspecified: Secondary | ICD-10-CM | POA: Diagnosis not present

## 2020-06-05 DIAGNOSIS — G894 Chronic pain syndrome: Secondary | ICD-10-CM | POA: Diagnosis not present

## 2020-06-05 DIAGNOSIS — Z6834 Body mass index (BMI) 34.0-34.9, adult: Secondary | ICD-10-CM | POA: Diagnosis not present

## 2020-06-05 DIAGNOSIS — I10 Essential (primary) hypertension: Secondary | ICD-10-CM | POA: Diagnosis not present

## 2020-06-10 DIAGNOSIS — M79609 Pain in unspecified limb: Secondary | ICD-10-CM | POA: Diagnosis not present

## 2020-06-10 DIAGNOSIS — M25561 Pain in right knee: Secondary | ICD-10-CM | POA: Diagnosis not present

## 2020-06-10 DIAGNOSIS — G501 Atypical facial pain: Secondary | ICD-10-CM | POA: Diagnosis not present

## 2020-06-10 DIAGNOSIS — G629 Polyneuropathy, unspecified: Secondary | ICD-10-CM | POA: Diagnosis not present

## 2020-06-10 DIAGNOSIS — M545 Low back pain, unspecified: Secondary | ICD-10-CM | POA: Diagnosis not present

## 2020-06-10 DIAGNOSIS — G894 Chronic pain syndrome: Secondary | ICD-10-CM | POA: Diagnosis not present

## 2020-06-10 DIAGNOSIS — M1712 Unilateral primary osteoarthritis, left knee: Secondary | ICD-10-CM | POA: Diagnosis not present

## 2020-06-10 DIAGNOSIS — M25512 Pain in left shoulder: Secondary | ICD-10-CM | POA: Diagnosis not present

## 2020-06-10 DIAGNOSIS — M25562 Pain in left knee: Secondary | ICD-10-CM | POA: Diagnosis not present

## 2020-06-10 DIAGNOSIS — M542 Cervicalgia: Secondary | ICD-10-CM | POA: Diagnosis not present

## 2020-06-10 DIAGNOSIS — R519 Headache, unspecified: Secondary | ICD-10-CM | POA: Diagnosis not present

## 2020-06-10 DIAGNOSIS — M170 Bilateral primary osteoarthritis of knee: Secondary | ICD-10-CM | POA: Diagnosis not present

## 2020-06-19 DIAGNOSIS — M1712 Unilateral primary osteoarthritis, left knee: Secondary | ICD-10-CM | POA: Diagnosis not present

## 2020-06-19 DIAGNOSIS — M25562 Pain in left knee: Secondary | ICD-10-CM | POA: Diagnosis not present

## 2020-06-21 DIAGNOSIS — Z419 Encounter for procedure for purposes other than remedying health state, unspecified: Secondary | ICD-10-CM | POA: Diagnosis not present

## 2020-06-23 DIAGNOSIS — R404 Transient alteration of awareness: Secondary | ICD-10-CM | POA: Diagnosis not present

## 2020-06-24 DIAGNOSIS — I10 Essential (primary) hypertension: Secondary | ICD-10-CM | POA: Diagnosis not present

## 2020-06-24 DIAGNOSIS — G43909 Migraine, unspecified, not intractable, without status migrainosus: Secondary | ICD-10-CM | POA: Diagnosis not present

## 2020-06-24 DIAGNOSIS — Z111 Encounter for screening for respiratory tuberculosis: Secondary | ICD-10-CM | POA: Diagnosis not present

## 2020-06-24 DIAGNOSIS — G894 Chronic pain syndrome: Secondary | ICD-10-CM | POA: Diagnosis not present

## 2020-06-24 DIAGNOSIS — G4700 Insomnia, unspecified: Secondary | ICD-10-CM | POA: Diagnosis not present

## 2020-07-22 DIAGNOSIS — Z419 Encounter for procedure for purposes other than remedying health state, unspecified: Secondary | ICD-10-CM | POA: Diagnosis not present

## 2020-07-31 DIAGNOSIS — G629 Polyneuropathy, unspecified: Secondary | ICD-10-CM | POA: Diagnosis not present

## 2020-07-31 DIAGNOSIS — M5137 Other intervertebral disc degeneration, lumbosacral region: Secondary | ICD-10-CM | POA: Diagnosis not present

## 2020-07-31 DIAGNOSIS — M25511 Pain in right shoulder: Secondary | ICD-10-CM | POA: Diagnosis not present

## 2020-07-31 DIAGNOSIS — M170 Bilateral primary osteoarthritis of knee: Secondary | ICD-10-CM | POA: Diagnosis not present

## 2020-07-31 DIAGNOSIS — M79609 Pain in unspecified limb: Secondary | ICD-10-CM | POA: Diagnosis not present

## 2020-07-31 DIAGNOSIS — G894 Chronic pain syndrome: Secondary | ICD-10-CM | POA: Diagnosis not present

## 2020-07-31 DIAGNOSIS — R519 Headache, unspecified: Secondary | ICD-10-CM | POA: Diagnosis not present

## 2020-07-31 DIAGNOSIS — M25562 Pain in left knee: Secondary | ICD-10-CM | POA: Diagnosis not present

## 2020-07-31 DIAGNOSIS — R55 Syncope and collapse: Secondary | ICD-10-CM | POA: Diagnosis not present

## 2020-07-31 DIAGNOSIS — M25561 Pain in right knee: Secondary | ICD-10-CM | POA: Diagnosis not present

## 2020-07-31 DIAGNOSIS — M542 Cervicalgia: Secondary | ICD-10-CM | POA: Diagnosis not present

## 2020-07-31 DIAGNOSIS — R0989 Other specified symptoms and signs involving the circulatory and respiratory systems: Secondary | ICD-10-CM | POA: Diagnosis not present

## 2020-08-05 ENCOUNTER — Ambulatory Visit: Payer: Medicaid Other

## 2020-08-07 DIAGNOSIS — Z741 Need for assistance with personal care: Secondary | ICD-10-CM | POA: Diagnosis not present

## 2020-08-08 ENCOUNTER — Ambulatory Visit: Payer: Medicaid Other | Admitting: Family Medicine

## 2020-08-11 DIAGNOSIS — M25512 Pain in left shoulder: Secondary | ICD-10-CM | POA: Diagnosis not present

## 2020-08-13 DIAGNOSIS — M1712 Unilateral primary osteoarthritis, left knee: Secondary | ICD-10-CM | POA: Diagnosis not present

## 2020-08-14 ENCOUNTER — Other Ambulatory Visit: Payer: Self-pay | Admitting: Internal Medicine

## 2020-08-14 DIAGNOSIS — Z13220 Encounter for screening for lipoid disorders: Secondary | ICD-10-CM | POA: Diagnosis not present

## 2020-08-14 DIAGNOSIS — I10 Essential (primary) hypertension: Secondary | ICD-10-CM | POA: Diagnosis not present

## 2020-08-14 DIAGNOSIS — Z0000 Encounter for general adult medical examination without abnormal findings: Secondary | ICD-10-CM | POA: Diagnosis not present

## 2020-08-14 DIAGNOSIS — R7303 Prediabetes: Secondary | ICD-10-CM | POA: Diagnosis not present

## 2020-08-14 DIAGNOSIS — M179 Osteoarthritis of knee, unspecified: Secondary | ICD-10-CM | POA: Diagnosis not present

## 2020-08-14 DIAGNOSIS — Z6834 Body mass index (BMI) 34.0-34.9, adult: Secondary | ICD-10-CM | POA: Diagnosis not present

## 2020-08-14 DIAGNOSIS — E559 Vitamin D deficiency, unspecified: Secondary | ICD-10-CM | POA: Diagnosis not present

## 2020-08-15 LAB — LIPID PANEL
Cholesterol: 206 mg/dL — ABNORMAL HIGH (ref <200)
HDL: 89 mg/dL (ref >=50)
LDL Cholesterol (Calc): 102 mg/dL (calc) — ABNORMAL HIGH
Non-HDL Cholesterol (Calc): 117 mg/dL (calc) (ref <130)
Total CHOL/HDL Ratio: 2.3 (calc) (ref <5.0)
Triglycerides: 67 mg/dL (ref <150)

## 2020-08-15 LAB — COMPLETE METABOLIC PANEL WITH GFR
AG Ratio: 1.5 (calc) (ref 1.0–2.5)
ALT: 14 U/L (ref 6–29)
AST: 16 U/L (ref 10–35)
Albumin: 4.3 g/dL (ref 3.6–5.1)
Alkaline phosphatase (APISO): 92 U/L (ref 37–153)
BUN: 14 mg/dL (ref 7–25)
CO2: 24 mmol/L (ref 20–32)
Calcium: 9.3 mg/dL (ref 8.6–10.4)
Chloride: 106 mmol/L (ref 98–110)
Creat: 1 mg/dL (ref 0.50–1.05)
GFR, Est African American: 72 mL/min/{1.73_m2} (ref >=60)
GFR, Est Non African American: 62 mL/min/{1.73_m2} (ref >=60)
Globulin: 2.8 g/dL (calc) (ref 1.9–3.7)
Glucose, Bld: 98 mg/dL (ref 65–99)
Potassium: 3.8 mmol/L (ref 3.5–5.3)
Sodium: 140 mmol/L (ref 135–146)
Total Bilirubin: 0.5 mg/dL (ref 0.2–1.2)
Total Protein: 7.1 g/dL (ref 6.1–8.1)

## 2020-08-15 LAB — CBC
HCT: 40.3 % (ref 35.0–45.0)
Hemoglobin: 14 g/dL (ref 11.7–15.5)
MCH: 31.1 pg (ref 27.0–33.0)
MCHC: 34.7 g/dL (ref 32.0–36.0)
MCV: 89.6 fL (ref 80.0–100.0)
MPV: 11 fL (ref 7.5–12.5)
Platelets: 233 10*3/uL (ref 140–400)
RBC: 4.5 10*6/uL (ref 3.80–5.10)
RDW: 13.5 % (ref 11.0–15.0)
WBC: 3.5 10*3/uL — ABNORMAL LOW (ref 3.8–10.8)

## 2020-08-15 LAB — VITAMIN D 25 HYDROXY (VIT D DEFICIENCY, FRACTURES): Vit D, 25-Hydroxy: 17 ng/mL — ABNORMAL LOW (ref 30–100)

## 2020-08-15 LAB — TSH: TSH: 1.71 mIU/L (ref 0.40–4.50)

## 2020-08-18 DIAGNOSIS — M25512 Pain in left shoulder: Secondary | ICD-10-CM | POA: Diagnosis not present

## 2020-08-18 DIAGNOSIS — M5459 Other low back pain: Secondary | ICD-10-CM | POA: Diagnosis not present

## 2020-08-19 DIAGNOSIS — Z419 Encounter for procedure for purposes other than remedying health state, unspecified: Secondary | ICD-10-CM | POA: Diagnosis not present

## 2020-08-26 DIAGNOSIS — M25512 Pain in left shoulder: Secondary | ICD-10-CM | POA: Diagnosis not present

## 2020-08-28 DIAGNOSIS — M545 Low back pain, unspecified: Secondary | ICD-10-CM | POA: Diagnosis not present

## 2020-08-28 DIAGNOSIS — M25511 Pain in right shoulder: Secondary | ICD-10-CM | POA: Diagnosis not present

## 2020-08-28 DIAGNOSIS — G629 Polyneuropathy, unspecified: Secondary | ICD-10-CM | POA: Diagnosis not present

## 2020-08-28 DIAGNOSIS — M25562 Pain in left knee: Secondary | ICD-10-CM | POA: Diagnosis not present

## 2020-08-28 DIAGNOSIS — M170 Bilateral primary osteoarthritis of knee: Secondary | ICD-10-CM | POA: Diagnosis not present

## 2020-08-28 DIAGNOSIS — G894 Chronic pain syndrome: Secondary | ICD-10-CM | POA: Diagnosis not present

## 2020-08-28 DIAGNOSIS — M5137 Other intervertebral disc degeneration, lumbosacral region: Secondary | ICD-10-CM | POA: Diagnosis not present

## 2020-08-28 DIAGNOSIS — M542 Cervicalgia: Secondary | ICD-10-CM | POA: Diagnosis not present

## 2020-08-28 DIAGNOSIS — R55 Syncope and collapse: Secondary | ICD-10-CM | POA: Diagnosis not present

## 2020-08-28 DIAGNOSIS — R0989 Other specified symptoms and signs involving the circulatory and respiratory systems: Secondary | ICD-10-CM | POA: Diagnosis not present

## 2020-08-28 DIAGNOSIS — M79609 Pain in unspecified limb: Secondary | ICD-10-CM | POA: Diagnosis not present

## 2020-08-28 DIAGNOSIS — M25561 Pain in right knee: Secondary | ICD-10-CM | POA: Diagnosis not present

## 2020-09-08 DIAGNOSIS — Z741 Need for assistance with personal care: Secondary | ICD-10-CM | POA: Diagnosis not present

## 2020-09-10 DIAGNOSIS — M25512 Pain in left shoulder: Secondary | ICD-10-CM | POA: Diagnosis not present

## 2020-09-15 DIAGNOSIS — M25512 Pain in left shoulder: Secondary | ICD-10-CM | POA: Diagnosis not present

## 2020-09-17 DIAGNOSIS — M25512 Pain in left shoulder: Secondary | ICD-10-CM | POA: Diagnosis not present

## 2020-09-18 DIAGNOSIS — M179 Osteoarthritis of knee, unspecified: Secondary | ICD-10-CM | POA: Diagnosis not present

## 2020-09-18 DIAGNOSIS — R7303 Prediabetes: Secondary | ICD-10-CM | POA: Diagnosis not present

## 2020-09-18 DIAGNOSIS — I10 Essential (primary) hypertension: Secondary | ICD-10-CM | POA: Diagnosis not present

## 2020-09-18 DIAGNOSIS — Z111 Encounter for screening for respiratory tuberculosis: Secondary | ICD-10-CM | POA: Diagnosis not present

## 2020-09-18 DIAGNOSIS — Z91018 Allergy to other foods: Secondary | ICD-10-CM | POA: Diagnosis not present

## 2020-09-19 DIAGNOSIS — Z419 Encounter for procedure for purposes other than remedying health state, unspecified: Secondary | ICD-10-CM | POA: Diagnosis not present

## 2020-09-24 ENCOUNTER — Emergency Department (HOSPITAL_COMMUNITY)
Admission: EM | Admit: 2020-09-24 | Discharge: 2020-09-25 | Disposition: A | Discharge status: Home / Self Care | Payer: Medicaid Other | Attending: Emergency Medicine | Admitting: Emergency Medicine

## 2020-09-24 ENCOUNTER — Emergency Department (HOSPITAL_COMMUNITY): Payer: Medicaid Other

## 2020-09-24 ENCOUNTER — Other Ambulatory Visit: Payer: Self-pay

## 2020-09-24 ENCOUNTER — Encounter (HOSPITAL_COMMUNITY): Payer: Self-pay

## 2020-09-24 DIAGNOSIS — R4020 Unspecified coma: Secondary | ICD-10-CM | POA: Diagnosis not present

## 2020-09-24 DIAGNOSIS — R519 Headache, unspecified: Secondary | ICD-10-CM | POA: Diagnosis not present

## 2020-09-24 DIAGNOSIS — M7989 Other specified soft tissue disorders: Secondary | ICD-10-CM | POA: Diagnosis not present

## 2020-09-24 DIAGNOSIS — Z79899 Other long term (current) drug therapy: Secondary | ICD-10-CM | POA: Insufficient documentation

## 2020-09-24 DIAGNOSIS — R55 Syncope and collapse: Secondary | ICD-10-CM | POA: Diagnosis not present

## 2020-09-24 DIAGNOSIS — G43909 Migraine, unspecified, not intractable, without status migrainosus: Secondary | ICD-10-CM | POA: Insufficient documentation

## 2020-09-24 DIAGNOSIS — M25512 Pain in left shoulder: Secondary | ICD-10-CM | POA: Diagnosis not present

## 2020-09-24 DIAGNOSIS — M25462 Effusion, left knee: Secondary | ICD-10-CM | POA: Diagnosis not present

## 2020-09-24 DIAGNOSIS — M1712 Unilateral primary osteoarthritis, left knee: Secondary | ICD-10-CM | POA: Diagnosis not present

## 2020-09-24 DIAGNOSIS — Z743 Need for continuous supervision: Secondary | ICD-10-CM | POA: Diagnosis not present

## 2020-09-24 DIAGNOSIS — G43901 Migraine, unspecified, not intractable, with status migrainosus: Secondary | ICD-10-CM

## 2020-09-24 DIAGNOSIS — R9431 Abnormal electrocardiogram [ECG] [EKG]: Secondary | ICD-10-CM | POA: Diagnosis not present

## 2020-09-24 DIAGNOSIS — W19XXXA Unspecified fall, initial encounter: Secondary | ICD-10-CM | POA: Diagnosis not present

## 2020-09-24 DIAGNOSIS — I10 Essential (primary) hypertension: Secondary | ICD-10-CM | POA: Diagnosis not present

## 2020-09-24 LAB — CBC WITH DIFFERENTIAL/PLATELET
Abs Immature Granulocytes: 0.02 10*3/uL (ref 0.00–0.07)
Basophils Absolute: 0 10*3/uL (ref 0.0–0.1)
Basophils Relative: 0 %
Eosinophils Absolute: 0.1 10*3/uL (ref 0.0–0.5)
Eosinophils Relative: 2 %
HCT: 44.8 % (ref 36.0–46.0)
Hemoglobin: 14.6 g/dL (ref 12.0–15.0)
Immature Granulocytes: 0 %
Lymphocytes Relative: 38 %
Lymphs Abs: 1.8 10*3/uL (ref 0.7–4.0)
MCH: 30.9 pg (ref 26.0–34.0)
MCHC: 32.6 g/dL (ref 30.0–36.0)
MCV: 94.7 fL (ref 80.0–100.0)
Monocytes Absolute: 0.4 10*3/uL (ref 0.1–1.0)
Monocytes Relative: 8 %
Neutro Abs: 2.5 10*3/uL (ref 1.7–7.7)
Neutrophils Relative %: 52 %
Platelets: 261 10*3/uL (ref 150–400)
RBC: 4.73 MIL/uL (ref 3.87–5.11)
RDW: 13.8 % (ref 11.5–15.5)
WBC: 4.7 10*3/uL (ref 4.0–10.5)
nRBC: 0 % (ref 0.0–0.2)

## 2020-09-24 LAB — COMPREHENSIVE METABOLIC PANEL
ALT: 36 U/L (ref 0–44)
AST: 25 U/L (ref 15–41)
Albumin: 4.3 g/dL (ref 3.5–5.0)
Alkaline Phosphatase: 79 U/L (ref 38–126)
Anion gap: 9 (ref 5–15)
BUN: 10 mg/dL (ref 6–20)
CO2: 27 mmol/L (ref 22–32)
Calcium: 9.2 mg/dL (ref 8.9–10.3)
Chloride: 102 mmol/L (ref 98–111)
Creatinine, Ser: 1.15 mg/dL — ABNORMAL HIGH (ref 0.44–1.00)
GFR, Estimated: 56 mL/min — ABNORMAL LOW (ref >60)
Glucose, Bld: 97 mg/dL (ref 70–99)
Potassium: 2.7 mmol/L — CL (ref 3.5–5.1)
Sodium: 138 mmol/L (ref 135–145)
Total Bilirubin: 0.5 mg/dL (ref 0.3–1.2)
Total Protein: 7.6 g/dL (ref 6.5–8.1)

## 2020-09-24 LAB — I-STAT BETA HCG BLOOD, ED (MC, WL, AP ONLY): I-stat hCG, quantitative: <5 m[IU]/mL (ref <5)

## 2020-09-24 LAB — LIPASE, BLOOD: Lipase: 31 U/L (ref 11–51)

## 2020-09-24 MED ORDER — KETOROLAC TROMETHAMINE 30 MG/ML IJ SOLN
15.0000 mg | Freq: Once | INTRAMUSCULAR | Status: AC
  Administered 2020-09-24: 15 mg via INTRAVENOUS
  Filled 2020-09-24: qty 1

## 2020-09-24 MED ORDER — METOCLOPRAMIDE HCL 5 MG/ML IJ SOLN: 10.0000 mg | Freq: Once | INTRAMUSCULAR | Status: DC

## 2020-09-24 MED ORDER — SODIUM CHLORIDE 0.9 % IV BOLUS
1000.0000 mL | Freq: Once | INTRAVENOUS | Status: AC
  Administered 2020-09-24: 1000 mL via INTRAVENOUS

## 2020-09-24 MED ORDER — METOCLOPRAMIDE HCL 5 MG/ML IJ SOLN
10.0000 mg | Freq: Once | INTRAMUSCULAR | Status: AC
  Administered 2020-09-24: 10 mg via INTRAVENOUS
  Filled 2020-09-24: qty 2

## 2020-09-24 MED ORDER — SODIUM CHLORIDE 0.9 % IV SOLN
25.0000 mg | Freq: Once | INTRAVENOUS | Status: AC
  Administered 2020-09-24: 25 mg via INTRAVENOUS
  Filled 2020-09-24: qty 1

## 2020-09-24 MED ORDER — FENTANYL CITRATE (PF) 100 MCG/2ML IJ SOLN
50.0000 ug | Freq: Once | INTRAMUSCULAR | Status: AC
  Administered 2020-09-24: 50 ug via INTRAVENOUS
  Filled 2020-09-24: qty 2

## 2020-09-24 MED ORDER — MAGNESIUM SULFATE IN D5W 1-5 GM/100ML-% IV SOLN
1.0000 g | Freq: Once | INTRAVENOUS | Status: AC
  Administered 2020-09-24: 1 g via INTRAVENOUS
  Filled 2020-09-24: qty 100

## 2020-09-24 MED ORDER — KETOROLAC TROMETHAMINE 15 MG/ML IJ SOLN
15.0000 mg | Freq: Once | INTRAMUSCULAR | Status: AC
  Administered 2020-09-24: 15 mg via INTRAVENOUS
  Filled 2020-09-24: qty 1

## 2020-09-24 MED ORDER — PROCHLORPERAZINE MALEATE 10 MG PO TABS: 10.0000 mg | ORAL_TABLET | Freq: Once | ORAL | Status: DC

## 2020-09-24 MED ORDER — ACETAMINOPHEN 500 MG PO TABS
1000.0000 mg | ORAL_TABLET | Freq: Once | ORAL | Status: DC
  Filled 2020-09-24: qty 2

## 2020-09-24 MED ORDER — MORPHINE SULFATE (PF) 4 MG/ML IV SOLN
4.0000 mg | Freq: Once | INTRAVENOUS | Status: AC
  Administered 2020-09-24: 4 mg via INTRAVENOUS
  Filled 2020-09-24: qty 1

## 2020-09-24 MED ORDER — ONDANSETRON HCL 4 MG/2ML IJ SOLN
4.0000 mg | Freq: Once | INTRAMUSCULAR | Status: DC
  Filled 2020-09-24: qty 2

## 2020-09-24 MED ORDER — DEXAMETHASONE SODIUM PHOSPHATE 10 MG/ML IJ SOLN
10.0000 mg | Freq: Once | INTRAMUSCULAR | Status: AC
  Administered 2020-09-24: 10 mg via INTRAVENOUS
  Filled 2020-09-24: qty 1

## 2020-09-24 MED ORDER — POTASSIUM CHLORIDE CRYS ER 20 MEQ PO TBCR
40.0000 meq | EXTENDED_RELEASE_TABLET | Freq: Once | ORAL | Status: AC
  Administered 2020-09-24: 40 meq via ORAL
  Filled 2020-09-24: qty 2

## 2020-09-24 MED ORDER — DIPHENHYDRAMINE HCL 25 MG PO CAPS: 50.0000 mg | ORAL_CAPSULE | Freq: Once | ORAL | Status: DC

## 2020-09-24 MED ORDER — IBUPROFEN 200 MG PO TABS: 600.0000 mg | ORAL_TABLET | Freq: Once | ORAL | Status: DC

## 2020-09-24 NOTE — ED Provider Notes (Addendum)
Physical Exam  BP (!) 143/92 (BP Location: Right Arm)   Pulse 77   Temp 97.9 F (36.6 C) (Oral)   Resp 16   Ht 5\' 5"  (1.651 m)   Wt 89.4 kg   SpO2 100%   BMI 32.78 kg/m   Physical Exam  ED Course/Procedures     .Critical Care Performed by: , MD Authorized by: [name], MD   Critical care provider statement:    Critical care time (minutes):  35   Critical care was time spent personally by me on the following activities:  Discussions with consultants, evaluation of patient's response to treatment, examination of patient, ordering and performing treatments and interventions, ordering and review of laboratory studies, ordering and review of radiographic studies, pulse oximetry, re-evaluation of patient's condition, obtaining history from patient or surrogate and review of old charts    MDM   Assuming care of patient from Dr. [name].   Patient in the ED for headaches and syncope. Workup thus far shows : normal x rays for knee pain.  Concerning findings are as following : none. Neuro exam is non focal. Important pending results are : CT head. IV team consulted as she doesn't have iv.  According to Dr. Hyman Bower, plan is to f/u on labs and CT.   Patient had no complains, no concerns from the nursing side. Will continue to monitor.   Wilkie Aye, MD 09/24/20 1535  Reassessment: Patient CT scan of the brain had resulted and it did not show any acute findings.  I noticed that patient still did not have lab results back.  I ordered some oral pain medications, and went to reassess the patient and discussed the CT findings.  As I was walking in, the IV team had just placed an IV.  I informed the patient the findings of the CT scan.  We will continue with the plan of ensuring the labs look fine.  Patient will be reassessed.  Reassessment: I went in and reassessed the patient around 7 PM.  I was going into reassess her for headaches.  The nursing staff  informed me at that time that patient's IV had blown.  Patient was upset.  She informed me that she had been in the ER for more than 5 hours and not much was done.  She informs me that some of the doctors do not take migraine seriously. I informed the patient the chronology of what has taken place (signout to current encounter). I apologize for the fact that she did not receive any medications until IV was placed, but explained to her that I did enter oral meds with the same concern, albeit it was delayed. Ultimately patient did get a second IV placed by the IV team.  Patient had informed me that promethazine is better than Zofran, therefore we had already made that switch.  The nurse will now give patient promethazine.  Patient informs me that her headache is still 10 out of 10.  I have reviewed her chart.  Ironically, I saw the patient last time.  She did require multiple medications.  I have placed IV dexamethasone, Reglan, Toradol, IV magnesium.  Patient was made aware of the low potassium, and oral potassium has been ordered.  I have impressed upon the patient that we have taken her headaches seriously and apologized for the delay in her care due to poor IV access.   11/24/20, MD 09/24/20 11/24/20    Margretta Ditty, MD 09/24/20 2213  Reassessment:  - Repeat exam, patient reports that her headache has improved significantly.  She is happy going home.  [place] [facility] information provided.    [name], MD 09/24/20 2217

## 2020-09-24 NOTE — ED Notes (Signed)
Patient's IV has infiltrated. IV team will be notified

## 2020-09-24 NOTE — Discharge Instructions (Addendum)
Please read the instructions on migraine. We also recommend seeing a neurologist for optimal management of acute flareups. Return to the ER if your symptoms are getting worse.

## 2020-09-24 NOTE — ED Notes (Signed)
The patient is awaiting for assessment.  She appears very drowsy but arouses when spoken to

## 2020-09-24 NOTE — ED Provider Notes (Addendum)
[facility]-EMERGENCY DEPT Provider Note   CSN: [id number] Arrival date & time: 09/24/20  1247     History Chief Complaint  Patient presents with  . Migraine    Kirsten Walsh is a 60 y.o. female.  HPI   60 year old female with past medical history of migraines, HTN, kidney stones presents the emergency department after an episode of syncope.  Patient was at physical therapy for her left shoulder.  She states that she has a daily migraine.  When she woke up this morning her migraine was more severe than baseline and associated with dry heaving.  She states that she has had the dry heaving associated with migraine before.  Denies any fever.  She states when she got to her physical therapy appointment that she felt like she was going to throw up so she went to the bathroom.  She began to dry heave and that is the last thing that she remembers.  She states that she had a syncopal event in the bathroom.  She is now complaining of left knee pain and continued headache.  Denies any other recent illness.  No neurologic symptoms.  No preceding chest pain, shortness of breath, dizziness.  Past Medical History:  Diagnosis Date  . Chest pain   . Chronic lower back pain    "right side to mid back" (07/06/2017)  . Family history of adverse reaction to anesthesia    "daughter did; not sure happened"  . Gall stones   . History of anaphylaxis 06/26/2014  . Hypertension   . Kidney stone ~ 2006  . Migraine    "qd" (07/06/2017)  . Syncope and collapse 07/06/2017    sitting on the commode; developed nausea and passed out; hit head on shower and suffered a laceration; woke up in "a pool of blood"    Patient Active Problem List   Diagnosis Date Noted  . Cervical cancer screening 03/09/2020  . Encounter for administration of vaccine 03/09/2020  . Preoperative clearance 07/24/2019  . Right arm pain 06/12/2019  . Syncope 07/06/2017  . Chronic daily headache 09/02/2015  .  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 Syncopal episodes 09/02/2015  . Right arm weakness 08/05/2015  . Edema of right arm and right leg 09/07/2014  . Intractable migraine with status migrainosus 06/10/2014  . Chronic migraine without aura, intractable, with status migrainosus   . Bradycardia 02/18/2013  . Left knee pain 01/16/2013  . Cardiomyopathy 10/20/2011  . Leg swelling 09/23/2011  . Complex regional pain syndrome of right lower extremity 05/03/2011  . Chronic pain 03/29/2011  . NECK PAIN, RIGHT 02/03/2010  . ALLERGIC RHINITIS 01/30/2009  . DIZZINESS 07/16/2008  . Chronic migraine 04/26/2007  . OBESITY, NOS 08/18/2006  . HYPERTENSION, BENIGN SYSTEMIC 08/18/2006    Past Surgical History:  Procedure Laterality Date  . CHONDROPLASTY Left 08/17/2019   Procedure: CHONDROPLASTY;  Surgeon: Frederico Hamman, MD;  Location: [facility];  Service: Orthopedics;  Laterality: Left;  . CYSTOSCOPY W/ STONE MANIPULATION  ~ 2006  . INTRAUTERINE DEVICE INSERTION     "initially put in in 04/2002; changed prn" (02/14/2013)  . KNEE ARTHROSCOPY WITH LATERAL MENISECTOMY  08/17/2019   Procedure: KNEE ARTHROSCOPY WITH LATERAL MENISECTOMY;  Surgeon: Frederico Hamman, MD;  Location: [facility];  Service: Orthopedics;;  . KNEE ARTHROSCOPY WITH MEDIAL MENISECTOMY Left 08/17/2019   Procedure: KNEE ARTHROSCOPY WITH MEDIAL MENISECTOMY;  Surgeon: Frederico Hamman, MD;  Location: [facility];  Service: Orthopedics;  Laterality: Left;  .  LAPAROSCOPIC CHOLECYSTECTOMY  ~ 2004     OB History   No obstetric history on file.     Family History  Problem Relation Age of Onset  . Cancer Mother   . Pancreatic cancer Mother   . Cirrhosis Father   . Lupus Sister   . Diabetes Maternal Grandmother     Social History   Tobacco Use  . Smoking status: Never Smoker  . Smokeless tobacco: Never Used  Vaping Use  . Vaping Use: Never used  Substance Use Topics  . Alcohol use: Yes     Comment: 07/06/2017 "couple mixed drinks/month"  . Drug use: No    Home Medications Prior to Admission medications   Medication Sig Start Date End Date Taking? Authorizing Provider  butalbital-acetaminophen-caffeine (FIORICET, ESGIC) 50-325-40 MG tablet Take 1 tablet by mouth every 6 (six) hours as needed for headache or migraine.    [provider]  celecoxib (CELEBREX) 100 MG capsule Take 1 capsule (100 mg total) by mouth 2 (two) times daily. 04/30/19   Wieters, Hallie C, PA-C  cloNIDine (CATAPRES) 0.1 MG tablet TAKE 1 TABLET(0.1 MG) BY MOUTH TWICE DAILY 08/28/18   Howard Pouch, MD  cyclobenzaprine (FLEXERIL) 5 MG tablet Take 1-2 tablets (5-10 mg total) by mouth 2 (two) times daily as needed for muscle spasms. 04/30/19   Wieters, Hallie C, PA-C  diazepam (VALIUM) 5 MG tablet Take 1 tablet (5 mg total) by mouth every 12 (twelve) hours as needed for muscle spasms. 04/26/20   Mardella Layman, MD  hydrochlorothiazide (HYDRODIURIL) 25 MG tablet TAKE 1 TABLET(25 MG) BY MOUTH DAILY 08/28/18   Howard Pouch, MD  lidocaine (LIDODERM) 5 % Place 1 patch onto the skin daily. Remove & Discard patch within 12 hours or as directed by MD 05/14/20   Shanon Ace, PA-C  ondansetron (ZOFRAN) 4 MG tablet Take 1 tablet (4 mg total) by mouth every 6 (six) hours. Patient taking differently: Take 4 mg by mouth as needed for nausea or vomiting.  12/28/17   Wurst, [place], PA-C  oxyCODONE (OXY IR/ROXICODONE) 5 MG immediate release tablet Take 5 mg by mouth every 4 (four) hours as needed for severe pain.    [provider]  PHENTERMINE HCL PO Take by mouth.    [provider]  polyethylene glycol (MIRALAX / GLYCOLAX) 17 g packet Take 17 g by mouth daily.    [provider]  potassium chloride (KLOR-CON) 10 MEQ tablet Take 2 tablets (20 mEq total) by mouth daily. 02/20/20   Dana Allan, MD  promethazine (PHENERGAN) 25 MG tablet Take 1 tablet (25 mg total) by mouth every 6 (six) hours as  needed for nausea or vomiting. 04/30/19   Wieters, Hallie C, PA-C  topiramate (TOPAMAX) 100 MG tablet TAKE 1 AND 1/2 TABLETS(150 MG) BY MOUTH TWICE DAILY 08/01/18   Howard Pouch, MD  potassium chloride SA (KLOR-CON) 20 MEQ tablet Take 1 tablet (20 mEq total) by mouth daily for 3 days. 04/05/19 04/30/19  Pricilla Loveless, MD  SUMAtriptan (IMITREX) 100 MG tablet Take 1 tablet (100 mg total) by mouth once. May repeat in 2 hours if headache persists or recurs. 01/31/17 08/22/19  Almon Hercules, MD    Allergies    Apple, Aspirin, Carrot [daucus carota], and Haloperidol and related  Review of Systems   Review of Systems  Constitutional: Negative for chills and fever.  HENT: Negative for congestion.   Eyes: Negative for visual disturbance.  Respiratory: Negative for chest tightness and shortness  of breath.   Cardiovascular: Negative for chest pain, palpitations and leg swelling.  Gastrointestinal: Positive for nausea. Negative for abdominal pain, diarrhea and vomiting.  Genitourinary: Negative for dysuria.  Skin: Negative for rash.  Neurological: Positive for syncope and headaches. Negative for seizures, weakness and numbness.    Physical Exam Updated Vital Signs BP (!) 143/92 (BP Location: Right Arm)   Pulse 77   Temp 97.9 F (36.6 C) (Oral)   Resp 16   Ht 5\' 5"  (1.651 m)   Wt 89.4 kg   SpO2 100%   BMI 32.78 kg/m   Physical Exam Vitals and nursing note reviewed.  Constitutional:      Appearance: Normal appearance.  HENT:     Head: Normocephalic.     Mouth/Throat:     Mouth: Mucous membranes are moist.  Cardiovascular:     Rate and Rhythm: Normal rate.  Pulmonary:     Effort: Pulmonary effort is normal. No respiratory distress.  Abdominal:     Palpations: Abdomen is soft.     Tenderness: There is no abdominal tenderness.  Musculoskeletal:     Comments: + Left knee swelling and tenderness to palpation  Skin:    General: Skin is warm.  Neurological:     Mental Status: She is  alert and oriented to person, place, and time. Mental status is at baseline.  Psychiatric:        Mood and Affect: Mood normal.     ED Results / Procedures / Treatments   Labs (all labs ordered are listed, but only abnormal results are displayed) Labs Reviewed  CBC WITH DIFFERENTIAL/PLATELET  COMPREHENSIVE METABOLIC PANEL  LIPASE, BLOOD  I-STAT BETA HCG BLOOD, ED ([facility], [facility], [facility] ONLY)    EKG None  Radiology DG Knee Complete 4 Views Left  Result Date: 09/24/2020 CLINICAL DATA:  Syncopal episode with fall earlier today. Knee pain. EXAM: LEFT KNEE - COMPLETE 4+ VIEW COMPARISON:  05/10/2020. FINDINGS: The mineralization and alignment are normal. There is no evidence of acute fracture or dislocation. There are stable tricompartmental degenerative changes and a stable small knee joint effusion. No foreign body or focal soft tissue swelling identified. IMPRESSION: No acute osseous findings. Stable degenerative changes and small joint effusion. Electronically Signed   By: 05/12/2020 M.D.   On: 09/24/2020 15:04    Procedures Procedures   Medications Ordered in ED Medications  sodium chloride 0.9 % bolus 1,000 mL (has no administration in time range)  morphine 4 MG/ML injection 4 mg (has no administration in time range)  ondansetron (ZOFRAN) injection 4 mg (has no administration in time range)    ED Course  I have reviewed the triage vital signs and the nursing notes.  Pertinent labs & imaging results that were available during my care of the patient were reviewed by me and considered in my medical decision making (see chart for details).    MDM Rules/Calculators/A&P                          61 year old female presents the emergency department with migraine, dry heaving and syncope. Seems vasovagal. She is now having left knee pain that she apparently injured during syncope.  No chest pain, shortness of breath, dizziness.  Does not appear to be a cardiac aspect of syncope.  EKG  shows normal sinus rhythm.  Plan to do head CT for severe headache associated with syncope.  Will treat symptomatically, x-ray of the knee.  If  head CT is unremarkable may pursue migraine cocktail.  Patient signed out to oncoming provider pending work-up and reevaluation.  Final Clinical Impression(s) / ED Diagnoses Final diagnoses:  None    Rx / DC Orders ED Discharge Orders    None       Rozelle LoganHorton, Olinda Nola M, DO 09/24/20 1521    Wesly Whisenant, Clabe SealKristie M, DO 09/24/20 1541

## 2020-09-24 NOTE — ED Triage Notes (Signed)
Patient was picked up at a local Doctor's office, she was scheduled for PT to her left shoulder.  Patient has previous left shoulder injury.  Patient reports having history of Migraines and has a "migraine every day"  Patient reported that she was ambulating to the bathroom and had a syncopal episode.  EMS reports that she was alert and oriented when they arrived.  On arrival, the patient answers all questions appropriately and is having dry heaving episodes.  No emesis noted.  Patient reports left knee pain after having the syncopal episode.

## 2020-09-25 DIAGNOSIS — G501 Atypical facial pain: Secondary | ICD-10-CM | POA: Diagnosis not present

## 2020-09-25 DIAGNOSIS — M79609 Pain in unspecified limb: Secondary | ICD-10-CM | POA: Diagnosis not present

## 2020-09-25 DIAGNOSIS — M542 Cervicalgia: Secondary | ICD-10-CM | POA: Diagnosis not present

## 2020-09-25 DIAGNOSIS — R0989 Other specified symptoms and signs involving the circulatory and respiratory systems: Secondary | ICD-10-CM | POA: Diagnosis not present

## 2020-09-25 DIAGNOSIS — M170 Bilateral primary osteoarthritis of knee: Secondary | ICD-10-CM | POA: Diagnosis not present

## 2020-09-25 DIAGNOSIS — M5137 Other intervertebral disc degeneration, lumbosacral region: Secondary | ICD-10-CM | POA: Diagnosis not present

## 2020-09-25 DIAGNOSIS — M25562 Pain in left knee: Secondary | ICD-10-CM | POA: Diagnosis not present

## 2020-09-25 DIAGNOSIS — G894 Chronic pain syndrome: Secondary | ICD-10-CM | POA: Diagnosis not present

## 2020-09-25 DIAGNOSIS — M25561 Pain in right knee: Secondary | ICD-10-CM | POA: Diagnosis not present

## 2020-09-25 DIAGNOSIS — M25511 Pain in right shoulder: Secondary | ICD-10-CM | POA: Diagnosis not present

## 2020-09-25 DIAGNOSIS — M545 Low back pain, unspecified: Secondary | ICD-10-CM | POA: Diagnosis not present

## 2020-09-25 DIAGNOSIS — R55 Syncope and collapse: Secondary | ICD-10-CM | POA: Diagnosis not present

## 2020-10-01 DIAGNOSIS — M25512 Pain in left shoulder: Secondary | ICD-10-CM | POA: Diagnosis not present

## 2020-10-02 DIAGNOSIS — M25512 Pain in left shoulder: Secondary | ICD-10-CM | POA: Diagnosis not present

## 2020-10-07 DIAGNOSIS — M25512 Pain in left shoulder: Secondary | ICD-10-CM | POA: Diagnosis not present

## 2020-10-14 DIAGNOSIS — M25512 Pain in left shoulder: Secondary | ICD-10-CM | POA: Diagnosis not present

## 2020-10-17 ENCOUNTER — Ambulatory Visit (HOSPITAL_COMMUNITY): Admission: EM | Admit: 2020-10-17 | Discharge: 2020-10-17 | Payer: Medicaid Other

## 2020-10-17 ENCOUNTER — Encounter (HOSPITAL_COMMUNITY): Payer: Self-pay | Admitting: *Deleted

## 2020-10-17 ENCOUNTER — Emergency Department (HOSPITAL_BASED_OUTPATIENT_CLINIC_OR_DEPARTMENT_OTHER): Payer: Medicaid Other

## 2020-10-17 ENCOUNTER — Encounter (HOSPITAL_COMMUNITY): Payer: Self-pay | Admitting: Emergency Medicine

## 2020-10-17 ENCOUNTER — Other Ambulatory Visit: Payer: Self-pay

## 2020-10-17 ENCOUNTER — Emergency Department (HOSPITAL_COMMUNITY)
Admission: EM | Admit: 2020-10-17 | Discharge: 2020-10-17 | Disposition: A | Discharge status: Home / Self Care | Payer: Medicaid Other | Attending: Emergency Medicine | Admitting: Emergency Medicine

## 2020-10-17 DIAGNOSIS — G43111 Migraine with aura, intractable, with status migrainosus: Secondary | ICD-10-CM | POA: Diagnosis not present

## 2020-10-17 DIAGNOSIS — R110 Nausea: Secondary | ICD-10-CM | POA: Insufficient documentation

## 2020-10-17 DIAGNOSIS — H53149 Visual discomfort, unspecified: Secondary | ICD-10-CM | POA: Diagnosis not present

## 2020-10-17 DIAGNOSIS — M25512 Pain in left shoulder: Secondary | ICD-10-CM | POA: Diagnosis not present

## 2020-10-17 DIAGNOSIS — G8929 Other chronic pain: Secondary | ICD-10-CM | POA: Diagnosis not present

## 2020-10-17 DIAGNOSIS — I10 Essential (primary) hypertension: Secondary | ICD-10-CM | POA: Diagnosis not present

## 2020-10-17 DIAGNOSIS — M7122 Synovial cyst of popliteal space [Baker], left knee: Secondary | ICD-10-CM | POA: Insufficient documentation

## 2020-10-17 DIAGNOSIS — W19XXXA Unspecified fall, initial encounter: Secondary | ICD-10-CM | POA: Diagnosis not present

## 2020-10-17 DIAGNOSIS — R202 Paresthesia of skin: Secondary | ICD-10-CM | POA: Diagnosis not present

## 2020-10-17 DIAGNOSIS — R531 Weakness: Secondary | ICD-10-CM | POA: Diagnosis not present

## 2020-10-17 DIAGNOSIS — M25562 Pain in left knee: Secondary | ICD-10-CM

## 2020-10-17 DIAGNOSIS — R609 Edema, unspecified: Secondary | ICD-10-CM

## 2020-10-17 DIAGNOSIS — R519 Headache, unspecified: Secondary | ICD-10-CM | POA: Diagnosis not present

## 2020-10-17 DIAGNOSIS — E876 Hypokalemia: Secondary | ICD-10-CM

## 2020-10-17 DIAGNOSIS — Z79899 Other long term (current) drug therapy: Secondary | ICD-10-CM | POA: Insufficient documentation

## 2020-10-17 LAB — BASIC METABOLIC PANEL
Anion gap: 7 (ref 5–15)
BUN: 6 mg/dL (ref 6–20)
CO2: 28 mmol/L (ref 22–32)
Calcium: 9 mg/dL (ref 8.9–10.3)
Chloride: 101 mmol/L (ref 98–111)
Creatinine, Ser: 1.01 mg/dL — ABNORMAL HIGH (ref 0.44–1.00)
GFR, Estimated: >60 mL/min (ref >60)
Glucose, Bld: 93 mg/dL (ref 70–99)
Potassium: 2.7 mmol/L — CL (ref 3.5–5.1)
Sodium: 136 mmol/L (ref 135–145)

## 2020-10-17 LAB — CBC WITH DIFFERENTIAL/PLATELET
Abs Immature Granulocytes: 0.01 10*3/uL (ref 0.00–0.07)
Basophils Absolute: 0 10*3/uL (ref 0.0–0.1)
Basophils Relative: 0 %
Eosinophils Absolute: 0.1 10*3/uL (ref 0.0–0.5)
Eosinophils Relative: 1 %
HCT: 39.5 % (ref 36.0–46.0)
Hemoglobin: 12.8 g/dL (ref 12.0–15.0)
Immature Granulocytes: 0 %
Lymphocytes Relative: 48 %
Lymphs Abs: 2.2 10*3/uL (ref 0.7–4.0)
MCH: 30.3 pg (ref 26.0–34.0)
MCHC: 32.4 g/dL (ref 30.0–36.0)
MCV: 93.4 fL (ref 80.0–100.0)
Monocytes Absolute: 0.4 10*3/uL (ref 0.1–1.0)
Monocytes Relative: 8 %
Neutro Abs: 2 10*3/uL (ref 1.7–7.7)
Neutrophils Relative %: 43 %
Platelets: 238 10*3/uL (ref 150–400)
RBC: 4.23 MIL/uL (ref 3.87–5.11)
RDW: 13.6 % (ref 11.5–15.5)
WBC: 4.6 10*3/uL (ref 4.0–10.5)
nRBC: 0 % (ref 0.0–0.2)

## 2020-10-17 MED ORDER — POTASSIUM CHLORIDE CRYS ER 20 MEQ PO TBCR
40.0000 meq | EXTENDED_RELEASE_TABLET | Freq: Once | ORAL | Status: AC
  Administered 2020-10-17: 40 meq via ORAL
  Filled 2020-10-17: qty 2

## 2020-10-17 MED ORDER — SODIUM CHLORIDE 0.9 % IV BOLUS
1000.0000 mL | Freq: Once | INTRAVENOUS | Status: DC
Start: 2020-10-17 — End: 2020-10-18

## 2020-10-17 MED ORDER — FENTANYL CITRATE (PF) 100 MCG/2ML IJ SOLN
50.0000 ug | Freq: Once | INTRAMUSCULAR | Status: AC
  Administered 2020-10-17: 50 ug via INTRAVENOUS
  Filled 2020-10-17: qty 2

## 2020-10-17 MED ORDER — MAGNESIUM SULFATE 2 GM/50ML IV SOLN
2.0000 g | Freq: Once | INTRAVENOUS | Status: AC
  Administered 2020-10-17: 2 g via INTRAVENOUS
  Filled 2020-10-17: qty 50

## 2020-10-17 MED ORDER — DEXAMETHASONE SODIUM PHOSPHATE 10 MG/ML IJ SOLN
10.0000 mg | Freq: Once | INTRAMUSCULAR | Status: AC
  Administered 2020-10-17: 10 mg via INTRAVENOUS
  Filled 2020-10-17: qty 1

## 2020-10-17 MED ORDER — KETOROLAC TROMETHAMINE 30 MG/ML IJ SOLN
30.0000 mg | Freq: Once | INTRAMUSCULAR | Status: AC
  Administered 2020-10-17: 30 mg via INTRAVENOUS
  Filled 2020-10-17: qty 1

## 2020-10-17 MED ORDER — PROCHLORPERAZINE EDISYLATE 10 MG/2ML IJ SOLN
10.0000 mg | Freq: Once | INTRAMUSCULAR | Status: AC
  Administered 2020-10-17: 10 mg via INTRAVENOUS
  Filled 2020-10-17: qty 2

## 2020-10-17 MED ORDER — DIPHENHYDRAMINE HCL 50 MG/ML IJ SOLN
12.5000 mg | Freq: Once | INTRAMUSCULAR | Status: AC
  Administered 2020-10-17: 12.5 mg via INTRAVENOUS
  Filled 2020-10-17: qty 1

## 2020-10-17 NOTE — ED Notes (Signed)
Assuming care. Scheduled medications given. IV fluids and mag infusing. Pt resting in bed @ this time w/ NAD noted. VSS. Placed Cardiac monitoring in place w/ bp cycling and spo2 being monnitored. Updated on plan of care. Deny needs or concerns @ this time. Bed low and locked.

## 2020-10-17 NOTE — ED Triage Notes (Signed)
Emergency Medicine Provider Triage Evaluation Note  Kirsten Walsh , a 60 y.o. female  was evaluated in triage.  Pt complains of migraines and left leg pain. [facility] at [facility] and send here for r/o dvt  Review of Systems  Positive: Headache, leg pain Negative: Chest pain, sob, numbness, weakness ,speech problesm  Physical Exam  BP (!) 144/111 (BP Location: Left Arm)   Pulse 87   Resp 18   SpO2 100%  Gen:   Awake, no distress   HEENT:  Atraumatic  Resp:  Normal effort  Cardiac:  Normal rate  Abd:   Nondistended MSK:   Moves extremities without difficulty , calf TTP, DP pulses intact on left Neuro:  Speech clear  Medical Decision Making  Medically screening exam initiated at 1:30 PM.  Appropriate orders placed.  Kirsten Walsh was informed that the remainder of the evaluation will be completed by another provider, this initial triage assessment does not replace that evaluation, and the importance of remaining in the ED until their evaluation is complete.  Clinical Impression   MSE was initiated and I personally evaluated the patient and placed orders (if any) at  1:33 PM on October 17, 2020.  The patient appears stable so that the remainder of the MSE may be completed by another provider.    Karrie Meres, [place] 10/17/20 1338

## 2020-10-17 NOTE — ED Provider Notes (Signed)
[facility] EMERGENCY DEPARTMENT Provider Note   CSN: [id number] Arrival date & time: 10/17/20  1322     History Chief Complaint  Patient presents with  . Knee Pain  . Headache    Kirsten Walsh is a 60 y.o. female with medical history significant for chronic knee pain, recurrent syncope, chronic daily headache followed by Dr. Gerilyn Pilgrim pain management who presents for evaluation of 2 separate complaints.  Patient states she had a fall 2 weeks ago.  Landed on her left knee.  Was seen here in the emergency department at that time had negative x-rays.  Patient states since then she has had pain to the posterior aspect of her knee.  She was seen by urgent care sent here for ultrasound to rule out DVT.  She denies any redness, swelling, warmth to the extremity.  No paresthesias.  Her pain is been chronic.  She has been scheduled for total knee replacement with Dr. Devonne Doughty with [facility].  No fever or chills.  No recent injuries.  Patient also with headache.  Patient states she has chronic daily migraine.  Typically located to right side of her head.  She has associated photophobia without phonophobia.  States she takes Topamax as well as opiates for her chronic headaches.  Does have floaters in her bilateral eyes with her migraines which she states is typical. No new changes. She also states she has chronic paresthesias and weakness to her right upper extremity right lower extremity.  States she has had nerve conduction studies done on this before and they cannot figure out the cause of this.  She denies any neck pain, neck stiffness or rigidity.  She states she feels nauseous.  She has had multiple episodes of NBNB emesis.  Her headaches are unchanged from her baseline.  She also states her weakness and paresthesias are unchanged from her baseline.  She rates her current headache a 10/10.   No fever, chills, nausea, vomiting, visual field cuts, neck pain, neck stiffness, chest  pain, shortness of breath abdominal pain, diarrhea.  No new weakness or paresthesias aside from her abnormal weakness.  Denies additional aggravating or alleviating factors  History obtained from patient and past medical records.  No interpreter used  HPI     Past Medical History:  Diagnosis Date  . Chest pain   . Chronic lower back pain    "right side to mid back" (07/06/2017)  . Family history of adverse reaction to anesthesia    "daughter did; not sure happened"  . Gall stones   . History of anaphylaxis 06/26/2014  . Hypertension   . Kidney stone ~ 2006  . Migraine    "qd" (07/06/2017)  . Syncope and collapse 07/06/2017    sitting on the commode; developed nausea and passed out; hit head on shower and suffered a laceration; woke up in "a pool of blood"    Patient Active Problem List   Diagnosis Date Noted  . Cervical cancer screening 03/09/2020  . Encounter for administration of vaccine 03/09/2020  . Preoperative clearance 07/24/2019  . Right arm pain 06/12/2019  . Syncope 07/06/2017  . Chronic daily headache 09/02/2015  . 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 Syncopal episodes 09/02/2015  . Right arm weakness 08/05/2015  . Edema of right arm and right leg 09/07/2014  . Intractable migraine with status migrainosus 06/10/2014  . Chronic migraine without aura, intractable, with status migrainosus   . Bradycardia 02/18/2013  . Left knee pain 01/16/2013  .  Cardiomyopathy 10/20/2011  . Leg swelling 09/23/2011  . Complex regional pain syndrome of right lower extremity 05/03/2011  . Chronic pain 03/29/2011  . NECK PAIN, RIGHT 02/03/2010  . ALLERGIC RHINITIS 01/30/2009  . DIZZINESS 07/16/2008  . Chronic migraine 04/26/2007  . OBESITY, NOS 08/18/2006  . HYPERTENSION, BENIGN SYSTEMIC 08/18/2006    Past Surgical History:  Procedure Laterality Date  . CHONDROPLASTY Left 08/17/2019   Procedure: CHONDROPLASTY;  Surgeon: Frederico Hamman, MD;  Location: [facility];   Service: Orthopedics;  Laterality: Left;  . CYSTOSCOPY W/ STONE MANIPULATION  ~ 2006  . INTRAUTERINE DEVICE INSERTION     "initially put in in 04/2002; changed prn" (02/14/2013)  . KNEE ARTHROSCOPY WITH LATERAL MENISECTOMY  08/17/2019   Procedure: KNEE ARTHROSCOPY WITH LATERAL MENISECTOMY;  Surgeon: Frederico Hamman, MD;  Location: [facility];  Service: Orthopedics;;  . KNEE ARTHROSCOPY WITH MEDIAL MENISECTOMY Left 08/17/2019   Procedure: KNEE ARTHROSCOPY WITH MEDIAL MENISECTOMY;  Surgeon: Frederico Hamman, MD;  Location: [facility];  Service: Orthopedics;  Laterality: Left;  . LAPAROSCOPIC CHOLECYSTECTOMY  ~ 2004     OB History   No obstetric history on file.     Family History  Problem Relation Age of Onset  . Cancer Mother   . Pancreatic cancer Mother   . Cirrhosis Father   . Lupus Sister   . Diabetes Maternal Grandmother     Social History   Tobacco Use  . Smoking status: Never Smoker  . Smokeless tobacco: Never Used  Vaping Use  . Vaping Use: Never used  Substance Use Topics  . Alcohol use: Yes    Comment: 07/06/2017 "couple mixed drinks/month"  . Drug use: No    Home Medications Prior to Admission medications   Medication Sig Start Date End Date Taking? Authorizing Provider  amitriptyline (ELAVIL) 25 MG tablet Take 25 mg by mouth at bedtime as needed for sleep. 08/26/20  Yes [provider]  Butalbital-APAP-Caffeine 50-325-40 MG capsule Take 1 capsule by mouth every 6 (six) hours as needed for headache (migraine). 09/04/20  Yes [provider]  cloNIDine (CATAPRES) 0.1 MG tablet TAKE 1 TABLET(0.1 MG) BY MOUTH TWICE DAILY Patient taking differently: Take 0.1 mg by mouth at bedtime. 08/28/18  Yes Howard Pouch, MD  Diclofenac Sodium 3 % GEL Apply 1 application topically 2 (two) times daily. 09/06/20  Yes [provider]  EPINEPHrine 0.3 mg/0.3 mL IJ SOAJ injection Inject 0.3 mg into the muscle as needed for anaphylaxis.  09/18/20  Yes [provider]  gabapentin (NEURONTIN) 300 MG capsule Take 300 mg by mouth 2 (two) times daily. 08/26/20  Yes [provider]  hydrochlorothiazide (HYDRODIURIL) 25 MG tablet TAKE 1 TABLET(25 MG) BY MOUTH DAILY Patient taking differently: Take 25 mg by mouth daily. 08/28/18  Yes Howard Pouch, MD  naloxone [facility]) nasal spray 4 mg/0.1 mL Place 1 spray into the nose once.   Yes [provider]  ondansetron (ZOFRAN) 4 MG tablet Take 4 mg by mouth 3 (three) times daily as needed for nausea or vomiting.   Yes [provider]  oxyCODONE (ROXICODONE) 15 MG immediate release tablet Take 15 mg by mouth every 12 (twelve) hours. 09/04/20  Yes [provider]  Phendimetrazine Tartrate 105 MG CP24 Take 105 mg by mouth every morning. 09/21/20  Yes [provider]  polyethylene glycol (MIRALAX / GLYCOLAX) 17 g packet Take 17 g by mouth daily.   Yes [provider]  potassium chloride (KLOR-CON)  10 MEQ tablet Take 2 tablets (20 mEq total) by mouth daily. 02/20/20  Yes Dana Allan, MD  topiramate (TOPAMAX) 100 MG tablet TAKE 1 AND 1/2 TABLETS(150 MG) BY MOUTH TWICE DAILY Patient taking differently: Take 150 mg by mouth 2 (two) times daily. 08/01/18  Yes Howard Pouch, MD  Vitamin D, Ergocalciferol, (DRISDOL) 1.25 MG (50000 UNIT) CAPS capsule Take 50,000 Units by mouth every 7 (seven) days. 09/14/20  Yes [provider]  promethazine (PHENERGAN) 25 MG tablet Take 1 tablet (25 mg total) by mouth every 6 (six) hours as needed for nausea or vomiting. Patient not taking: Reported on 10/17/2020 04/30/19   Wieters, Hallie C, PA-C  potassium chloride SA (KLOR-CON) 20 MEQ tablet Take 1 tablet (20 mEq total) by mouth daily for 3 days. 04/05/19 04/30/19  Pricilla Loveless, MD  SUMAtriptan (IMITREX) 100 MG tablet Take 1 tablet (100 mg total) by mouth once. May repeat in 2 hours if headache persists or recurs. 01/31/17 08/22/19  Almon Hercules, MD    Allergies     Apple, Aspirin, Daucus carota, and Haloperidol and related  Review of Systems   Review of Systems  Constitutional: Negative.   HENT: Negative.   Respiratory: Negative.   Cardiovascular: Negative.   Gastrointestinal: Positive for nausea. Negative for abdominal distention, abdominal pain, anal bleeding, blood in stool, constipation, diarrhea, rectal pain and vomiting.  Genitourinary: Negative.   Musculoskeletal: Negative.   Skin: Negative.   Neurological: Positive for weakness (Chronic right weakness), numbness (Chronic paresthesias to right arm, at baseline) and headaches (Chronic daily migraine).  All other systems reviewed and are negative.   Physical Exam Updated Vital Signs BP 113/65   Pulse 87   Temp 98.1 F (36.7 C) (Oral)   Resp 12   Ht 5\' 5"  (1.651 m)   Wt 86.2 kg   SpO2 100%   BMI 31.62 kg/m   Physical Exam Physical Exam  Constitutional: Pt is oriented to person, place, and time. Pt appears well-developed and well-nourished. No distress.  HENT:  Head: Normocephalic and atraumatic.  Mouth/Throat: Oropharynx is clear and moist.  Eyes: Conjunctivae and EOM are normal. Pupils are equal, round, and reactive to light. No scleral icterus.  No horizontal, vertical or rotational nystagmus  Neck: Normal range of motion. Neck supple.  Full active and passive ROM without pain No midline or paraspinal tenderness No nuchal rigidity or meningeal signs  Cardiovascular: Normal rate, regular rhythm and intact distal pulses.   Pulmonary/Chest: Effort normal and breath sounds normal. No respiratory distress. Pt has no wheezes. No rales.  Abdominal: Soft. Bowel sounds are normal. There is no tenderness. There is no rebound and no guarding.  Musculoskeletal: Normal range of motion.  Tenderness to left knee.  She is able to straight leg raise without difficulty.  No overlying erythema or warmth.  Tenderness to left posterior knee.  No bony tenderness to proximal, distal tib-fib,  distal femur.  No joint line tenderness. Lymphadenopathy:    No cervical adenopathy.  Neurological: Pt. is alert and oriented to person, place, and time. He has normal reflexes. No cranial nerve deficit.  Exhibits normal muscle tone. Coordination normal.  Mental Status:  Alert, oriented, thought content appropriate. Speech fluent without evidence of aphasia. Able to follow 2 step commands without difficulty.  Cranial Nerves:  II:  Peripheral visual fields grossly normal, pupils equal, round, reactive to light III,IV, VI: ptosis not present, extra-ocular motions intact bilaterally  V,VII: smile symmetric, facial light touch sensation equal VIII:  hearing grossly normal bilaterally  IX,X: midline uvula rise  XI: bilateral shoulder shrug equal and strong XII: midline tongue extension  Motor:  5/5  In left upper and lower extremities. 4.5 in right upper and lower extremities Sensory: Pinprick and light touch normal in all extremities.  Cerebellar: normal finger-to-nose with bilateral upper extremities Gait: normal gait and balance CV: distal pulses palpable throughout   Skin: Skin is warm and dry. No rash noted. Pt is not diaphoretic.  Psychiatric: Pt has a normal mood and affect. Behavior is normal. Judgment and thought content normal.  Nursing note and vitals reviewed. ED Results / Procedures / Treatments   Labs (all labs ordered are listed, but only abnormal results are displayed) Labs Reviewed  BASIC METABOLIC PANEL - Abnormal; Notable for the following components:      Result Value   Potassium 2.7 (*)    Creatinine, Ser 1.01 (*)    All other components within normal limits  CBC WITH DIFFERENTIAL/PLATELET    EKG None  Radiology VAS [place] LOWER EXTREMITY VENOUS (DVT) ([facility] and [facility] 7a-7p)  Result Date: 10/17/2020  Lower Venous DVT Study Patient Name:  MARANDA MARTE Runner  Date of Exam:   10/17/2020 Medical Rec #: [id number]           Accession #:    [id number] Date of Birth: [birth year]           Patient Gender: F Patient Age:   35Y Exam Location:  [facility] Procedure:      VAS [place] LOWER EXTREMITY VENOUS (DVT) Referring Phys: [id number] CORTNI S COUTURE --------------------------------------------------------------------------------  Indications: Edema.  Risk Factors: None identified. Limitations: Poor ultrasound/tissue interface. Comparison Study: No prior studies. Performing Technologist: Chanda Busing RVT  Examination Guidelines: A complete evaluation includes B-mode imaging, spectral Doppler, color Doppler, and power Doppler as needed of all accessible portions of each vessel. Bilateral testing is considered an integral part of a complete examination. Limited examinations for reoccurring indications may be performed as noted. The reflux portion of the exam is performed with the patient in reverse Trendelenburg.  +-----+---------------+---------+-----------+----------+--------------+ RIGHTCompressibilityPhasicitySpontaneityPropertiesThrombus Aging +-----+---------------+---------+-----------+----------+--------------+ CFV  Full           Yes      Yes                                 +-----+---------------+---------+-----------+----------+--------------+   +---------+---------------+---------+-----------+----------+--------------+ LEFT     CompressibilityPhasicitySpontaneityPropertiesThrombus Aging +---------+---------------+---------+-----------+----------+--------------+ CFV      Full           Yes      Yes                                 +---------+---------------+---------+-----------+----------+--------------+ SFJ      Full                                                        +---------+---------------+---------+-----------+----------+--------------+ FV Prox  Full                                                        +---------+---------------+---------+-----------+----------+--------------+  FV Mid   Full                                                         +---------+---------------+---------+-----------+----------+--------------+ FV DistalFull                                                        +---------+---------------+---------+-----------+----------+--------------+ PFV      Full                                                        +---------+---------------+---------+-----------+----------+--------------+ POP      Full           Yes      Yes                                 +---------+---------------+---------+-----------+----------+--------------+ PTV      Full                                                        +---------+---------------+---------+-----------+----------+--------------+ PERO     Full                                                        +---------+---------------+---------+-----------+----------+--------------+    Summary: RIGHT: - No evidence of common femoral vein obstruction.  LEFT: - There is no evidence of deep vein thrombosis in the lower extremity.  - A cystic structure, measuring 1.9 cm high by 3.4 cm wide by greater than 5 cm long, is found in the popliteal fossa.  *See table(s) above for measurements and observations.    Preliminary     Procedures Procedures   Medications Ordered in ED Medications  potassium chloride SA (KLOR-CON) CR tablet 40 mEq (has no administration in time range)  dexamethasone (DECADRON) injection 10 mg (has no administration in time range)  magnesium sulfate IVPB 2 g 50 mL (has no administration in time range)  ketorolac (TORADOL) 30 MG/ML injection 30 mg (has no administration in time range)  fentaNYL (SUBLIMAZE) injection 50 mcg (has no administration in time range)  prochlorperazine (COMPAZINE) injection 10 mg (10 mg Intravenous Given 10/17/20 1731)  diphenhydrAMINE (BENADRYL) injection 12.5 mg (12.5 mg Intravenous Given 10/17/20 1723)  sodium chloride 0.9 % bolus 1,000 mL (1,000 mLs Intravenous Bolus 10/17/20 1723)    ED Course   I have reviewed the triage vital signs and the nursing notes.  Pertinent labs & imaging results that were available during my care of the patient were reviewed by me and considered in my medical decision making (see chart for details).  60 year old here for  evaluation of headache and leg pain.  Afebrile, nonseptic, not ill-appearing.  Both issues seem to be chronic.  She is post to have her knee replaced with Dr. Devonne DoughtyNoris.  Apparently had fall 2 weeks ago and has had worsening pain to her knee.  She was seen and evaluated at that time.  I reviewed her x-rays which do not show evidence of acute fractures.  Her pain is located diffusely to her knee.  Her compartments are soft.  She has no bony tenderness to her proximal, distal tib-fib, distal femur.  She has no overlying edema, erythema or warmth to suggest septic joint, gout or hemarthrosis.  Urgent care sent her here for DVT rule out which was subsequently negative for VTE of her she does have a cystic structure in posterior aspect of her knee which is where her pain is located.  Patient also with headache.  She has history of chronic daily migraine.  Followed by Dr. Gerilyn Pilgrimoonquah with neurology as well as the pain clinic.  Pain located diffusely to head.  She has associated photophobia.  No recent syncope, traumatic injury.  Does have chronic weakness and paresthesias to her right upper and lower extremity patient states she has had longstanding and has had nerve conduction studies to.  She has no neck stiffness or neck rigidity.  She has no meningismus.  She is afebrile.  Has been compliant her regular medications.  She has no risk factors for venous thrombosis.  No recent traumatic injuries to suggest bleed.  Symptoms do not seem consistent with dissection.  We will plan on migraine cocktail and reassess.   Patient discussed with attending, Dr. Audley HoseHong.  We will hold off on imaging and attempt migraine cocktail  Labs significant for hypokalemia.  Will give  K-Dur.  Reassessed patient. Her nausea has improved however still states her headache is rated a 9/10.  I reviewed her prior emergency department records.  Typically improves with Decadron, Toradol, fentanyl, magnesium. Ordered here.  Care transferred to Dr. Audley HoseHong who will follow up on patient reassessment and determine disposition     MDM Rules/Calculators/A&P                           Final Clinical Impression(s) / ED Diagnoses Final diagnoses:  Chronic pain of left knee  Synovial cyst of left popliteal space    Rx / DC Orders ED Discharge Orders    None       Linwood DibblesHenderly, Mitsy Owen A, PA-C 10/17/20 1850    Cheryll CockayneHong, Joshua S, MD 10/17/20 1946

## 2020-10-17 NOTE — ED Triage Notes (Addendum)
Patient states she was seen at [facility]  For c/o headache and left knee pain, was seen to ED for further eval. Patient states she takes medication daily for chronic migraines.

## 2020-10-17 NOTE — Discharge Instructions (Addendum)
Call your primary care doctor or specialist as discussed in the next 2-3 days.  Continue taking your daily potassium tablets.  Double up to 2 tablets daily for the next 2 days, then go back to 1 tablet a day regular regimen.  Return immediately back to the ER if:  Your symptoms worsen within the next 12-24 hours. You develop new symptoms such as new fevers, persistent vomiting, new pain, shortness of breath, or new weakness or numbness, or if you have any other concerns.

## 2020-10-17 NOTE — Discharge Instructions (Addendum)
Please have your driver take you directly to the emergency room

## 2020-10-17 NOTE — ED Triage Notes (Signed)
Pt presents with migraine and left leg pain. States unable to get in with ortho until 5/3.

## 2020-10-17 NOTE — ED Notes (Addendum)
Medications follow up appts reviewed w/ pt. Denies questions or concerns @ this time. Education on s/s of worsening and when to return Tx for chronic L knee pain w/ mediation. State relief @ this time. Fluids and mag completed, Hpokalemic potssium replaced. Eduation given on taking potassium supplemtnet. . Left w/ this RN in wheelchair. Pt walked to car once outside w/ even and steady gait. Marland Kitchen NAD noted. PIV removed and VSS.

## 2020-10-17 NOTE — ED Notes (Signed)
Patient is being discharged from the Urgent Care and sent to the Emergency Department via personal vehicle with friend . Per provider Clent Jacks, patient is in need of higher level of care due to migraine. Patient is aware and verbalizes understanding of plan of care.   Vitals:   10/17/20 1146  BP: 139/82  Pulse: 87  Resp: 19  Temp: 98.1 F (36.7 C)  SpO2: 98%

## 2020-10-17 NOTE — Progress Notes (Signed)
Left lower extremity venous duplex has been completed. Preliminary results can be found in CV Proc through chart review.  Results were given to [facility] PA  10/17/20 3:39 PM Olen Cordial RVT

## 2020-10-17 NOTE — ED Provider Notes (Signed)
Patient signed out to me pending repeat evaluation after headache cocktail.  Patient states that symptoms have improved.  Potassium noted to be 2.7 and given repletion here.  Given additional potassium check at home.  Advised her to follow-up with her neurologist within the week, advised immediate return for worsening symptoms or any additional concerns.    Cheryll Cockayne, MD 10/17/20 [phone]

## 2020-10-17 NOTE — ED Provider Notes (Signed)
[facility]    CSN: [id number] Arrival date & time: 10/17/20  1100      History   Chief Complaint Chief Complaint  Patient presents with  . Migraine  . Leg Pain    Left    HPI Kirsten Walsh is a 59 y.o. female.   HPI  Migraine: Pt appears uncomfortable, rocking back and forth in wheelchair. Patient states that she has chronic migraines.  Migraines occur daily and are not well controlled.  She is seen by neurology and reports that she has had imaging of the head. Today she is having a migraine that is worse than typical for her. She does not 2 recent episodes of syncope and head injury with LOC both when she was using the rest room. Currently she is taking amitriptyline along with Topamax. She also reports that she is taking oxycodone for pain relief for her migraines. Migraine is still 10/10 in nature. Possible neck stiffness and visual changes.   She also reports acute on chronic left knee pain. Entire knee is painful and more swollen than normal per patient but she reports most of the pain is in the anterior aspect. She is scheduled for a knee replacement but this is not until June. No chest pain or SOB.   Past Medical History:  Diagnosis Date  . Chest pain   . Chronic lower back pain    "right side to mid back" (07/06/2017)  . Family history of adverse reaction to anesthesia    "daughter did; not sure happened"  . Gall stones   . History of anaphylaxis 06/26/2014  . Hypertension   . Kidney stone ~ 2006  . Migraine    "qd" (07/06/2017)  . Syncope and collapse 07/06/2017    sitting on the commode; developed nausea and passed out; hit head on shower and suffered a laceration; woke up in "a pool of blood"    Patient Active Problem List   Diagnosis Date Noted  . Cervical cancer screening 03/09/2020  . Encounter for administration of vaccine 03/09/2020  . Preoperative clearance 07/24/2019  . Right arm pain 06/12/2019  . Syncope 07/06/2017  . Chronic daily  headache 09/02/2015  . 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 Syncopal episodes 09/02/2015  . Right arm weakness 08/05/2015  . Edema of right arm and right leg 09/07/2014  . Intractable migraine with status migrainosus 06/10/2014  . Chronic migraine without aura, intractable, with status migrainosus   . Bradycardia 02/18/2013  . Left knee pain 01/16/2013  . Cardiomyopathy 10/20/2011  . Leg swelling 09/23/2011  . Complex regional pain syndrome of right lower extremity 05/03/2011  . Chronic pain 03/29/2011  . NECK PAIN, RIGHT 02/03/2010  . ALLERGIC RHINITIS 01/30/2009  . DIZZINESS 07/16/2008  . Chronic migraine 04/26/2007  . OBESITY, NOS 08/18/2006  . HYPERTENSION, BENIGN SYSTEMIC 08/18/2006    Past Surgical History:  Procedure Laterality Date  . CHONDROPLASTY Left 08/17/2019   Procedure: CHONDROPLASTY;  Surgeon: Frederico Hamman, MD;  Location: [facility];  Service: Orthopedics;  Laterality: Left;  . CYSTOSCOPY W/ STONE MANIPULATION  ~ 2006  . INTRAUTERINE DEVICE INSERTION     "initially put in in 04/2002; changed prn" (02/14/2013)  . KNEE ARTHROSCOPY WITH LATERAL MENISECTOMY  08/17/2019   Procedure: KNEE ARTHROSCOPY WITH LATERAL MENISECTOMY;  Surgeon: Frederico Hamman, MD;  Location: [facility];  Service: Orthopedics;;  . KNEE ARTHROSCOPY WITH MEDIAL MENISECTOMY Left 08/17/2019   Procedure: KNEE ARTHROSCOPY WITH MEDIAL MENISECTOMY;  Surgeon: Frederico Hamman,  MD;  Location:  [facility];  Service: Orthopedics;  Laterality: Left;  . LAPAROSCOPIC CHOLECYSTECTOMY  ~ 2004    OB History   No obstetric history on file.      Home Medications    Prior to Admission medications   Medication Sig Start Date End Date Taking? Authorizing Provider  amitriptyline (ELAVIL) 25 MG tablet Take 25 mg by mouth at bedtime as needed for sleep. 08/26/20   [provider]  Butalbital-APAP-Caffeine 50-325-40 MG capsule Take 1 capsule by mouth every 6 (six) hours  as needed for headache (migraine). 09/04/20   [provider]  celecoxib (CELEBREX) 100 MG capsule Take 1 capsule (100 mg total) by mouth 2 (two) times daily. Patient not taking: No sig reported 04/30/19   Wieters, Hallie C, PA-C  cloNIDine (CATAPRES) 0.1 MG tablet TAKE 1 TABLET(0.1 MG) BY MOUTH TWICE DAILY Patient taking differently: Take 0.1 mg by mouth at bedtime. 08/28/18   Howard Pouch, MD  cyclobenzaprine (FLEXERIL) 5 MG tablet Take 1-2 tablets (5-10 mg total) by mouth 2 (two) times daily as needed for muscle spasms. Patient not taking: No sig reported 04/30/19   Wieters, Hallie C, PA-C  diazepam (VALIUM) 5 MG tablet Take 1 tablet (5 mg total) by mouth every 12 (twelve) hours as needed for muscle spasms. Patient not taking: No sig reported 04/26/20   Mardella Layman, MD  Diclofenac Sodium 3 % GEL Apply 1 application topically 2 (two) times daily. 09/06/20   [provider]  EPINEPHrine 0.3 mg/0.3 mL IJ SOAJ injection Inject 0.3 mg into the muscle as needed for anaphylaxis. 09/18/20   [provider]  gabapentin (NEURONTIN) 300 MG capsule Take 300 mg by mouth 2 (two) times daily. 08/26/20   [provider]  hydrochlorothiazide (HYDRODIURIL) 25 MG tablet TAKE 1 TABLET(25 MG) BY MOUTH DAILY 08/28/18   Howard Pouch, MD  lidocaine (LIDODERM) 5 % Place 1 patch onto the skin daily. Remove & Discard patch within 12 hours or as directed by MD Patient not taking: No sig reported 05/14/20   Namon Cirri E, PA-C  ondansetron (ZOFRAN) 4 MG tablet Take 1 tablet (4 mg total) by mouth every 6 (six) hours. Patient taking differently: Take 4 mg by mouth as needed for nausea or vomiting. 12/28/17   Wurst, [place], PA-C  oxyCODONE (ROXICODONE) 15 MG immediate release tablet Take 15 mg by mouth every 12 (twelve) hours. 09/04/20   [provider]  Phendimetrazine Tartrate 105 MG CP24 Take 1 capsule by mouth every morning. 09/21/20   [provider]  polyethylene glycol  (MIRALAX / GLYCOLAX) 17 g packet Take 17 g by mouth daily.    [provider]  potassium chloride (KLOR-CON) 10 MEQ tablet Take 2 tablets (20 mEq total) by mouth daily. 02/20/20   Dana Allan, MD  promethazine (PHENERGAN) 25 MG tablet Take 1 tablet (25 mg total) by mouth every 6 (six) hours as needed for nausea or vomiting. 04/30/19   Wieters, Hallie C, PA-C  topiramate (TOPAMAX) 100 MG tablet TAKE 1 AND 1/2 TABLETS(150 MG) BY MOUTH TWICE DAILY 08/01/18   Howard Pouch, MD  Vitamin D, Ergocalciferol, (DRISDOL) 1.25 MG (50000 UNIT) CAPS capsule Take 1 capsule by mouth once a week. 09/14/20   [provider]  potassium chloride SA (KLOR-CON) 20 MEQ tablet Take 1 tablet (20 mEq total) by mouth daily for 3 days. 04/05/19 04/30/19  Pricilla Loveless, MD  SUMAtriptan (IMITREX) 100 MG tablet Take 1 tablet (100 mg total) by  mouth once. May repeat in 2 hours if headache persists or recurs. 01/31/17 08/22/19  Almon HerculesGonfa, Taye T, MD    Family History Family History  Problem Relation Age of Onset  . Cancer Mother   . Pancreatic cancer Mother   . Cirrhosis Father   . Lupus Sister   . Diabetes Maternal Grandmother     Social History Social History   Tobacco Use  . Smoking status: Never Smoker  . Smokeless tobacco: Never Used  Vaping Use  . Vaping Use: Never used  Substance Use Topics  . Alcohol use: Yes    Comment: 07/06/2017 "couple mixed drinks/month"  . Drug use: No     Allergies   Apple, Aspirin, Carrot [daucus carota], and Haloperidol and related   Review of Systems Review of Systems  As stated above in HPI Physical Exam Triage Vital Signs ED Triage Vitals  Enc Vitals Group     BP 10/17/20 1146 139/82     Pulse Rate 10/17/20 1146 87     Resp 10/17/20 1146 19     Temp 10/17/20 1146 98.1 F (36.7 C)     Temp Source 10/17/20 1146 Oral     SpO2 10/17/20 1146 98 %     Weight --      Height --      Head Circumference --      Peak Flow --      Pain Score 10/17/20 1144 10      Pain Loc --      Pain Edu? --      Excl. in GC? --    No data found.  Updated Vital Signs BP 139/82 (BP Location: Right Arm)   Pulse 87   Temp 98.1 F (36.7 C) (Oral)   Resp 19   SpO2 98%   Physical Exam Vitals and nursing note reviewed.  Constitutional:      General: She is in acute distress.     Appearance: She is not ill-appearing, toxic-appearing or diaphoretic.  HENT:     Head: Normocephalic and atraumatic.     Right Ear: Tympanic membrane normal.     Left Ear: Tympanic membrane normal.     Nose: Nose normal.  Eyes:     General:        Right eye: No discharge.        Left eye: No discharge.     Extraocular Movements: Extraocular movements intact.     Pupils: Pupils are equal, round, and reactive to light.     Comments: Photophobia bilaterally   Musculoskeletal:        General: Swelling and tenderness present.     Cervical back: Normal range of motion. No rigidity.     Right lower leg: No edema.     Left lower leg: No edema.     Comments: ROM bilateral knees reduced by about 50% throughout.   Skin:    General: Skin is warm.     Coloration: Skin is not jaundiced or pale.     Findings: No bruising or erythema.  Neurological:     Mental Status: She is alert and oriented to person, place, and time.     Cranial Nerves: Cranial nerves are intact.     Sensory: Sensation is intact.     Motor: Motor function is intact.     Coordination: Coordination is intact.  Psychiatric:     Comments: Pt appears anxious and tearful. Expresses significant pain to very light touch of knee  UC Treatments / Results  Labs (all labs ordered are listed, but only abnormal results are displayed) Labs Reviewed - No data to display  EKG   Radiology No results found.  Procedures Procedures (including critical care time)  Medications Ordered in UC Medications - No data to display  Initial Impression / Assessment and Plan / UC Course  I have reviewed the triage vital signs  and the nursing notes.  Pertinent labs & imaging results that were available during my care of the patient were reviewed by me and considered in my medical decision making (see chart for details).     New.  I discussed with patient my concerns regarding her symptoms and recent syncopal episodes with head injury.  I believe that she will need further work-up in the emergency room especially considering that her multitude of medications is not helping her symptoms to rule out bleed or injury.  Patient has a driver and elects to travel via private vehicle.  I also recommend that she have evaluation further on the knee.  This does not appear to be a DVT but acute on chronic knee pain in her history could represent a DVT and so this likely will need to be assessed further. Final Clinical Impressions(s) / UC Diagnoses   Final diagnoses:  None   Discharge Instructions   None    ED Prescriptions    None     PDMP not reviewed this encounter.   Rushie Chestnut, [place] 10/17/20 1255

## 2020-10-19 DIAGNOSIS — Z419 Encounter for procedure for purposes other than remedying health state, unspecified: Secondary | ICD-10-CM | POA: Diagnosis not present

## 2020-10-19 DIAGNOSIS — G894 Chronic pain syndrome: Secondary | ICD-10-CM | POA: Diagnosis not present

## 2020-10-22 DIAGNOSIS — M25512 Pain in left shoulder: Secondary | ICD-10-CM | POA: Diagnosis not present

## 2020-10-23 DIAGNOSIS — Z6831 Body mass index (BMI) 31.0-31.9, adult: Secondary | ICD-10-CM | POA: Diagnosis not present

## 2020-10-23 DIAGNOSIS — M542 Cervicalgia: Secondary | ICD-10-CM | POA: Diagnosis not present

## 2020-10-23 DIAGNOSIS — M170 Bilateral primary osteoarthritis of knee: Secondary | ICD-10-CM | POA: Diagnosis not present

## 2020-10-23 DIAGNOSIS — M25511 Pain in right shoulder: Secondary | ICD-10-CM | POA: Diagnosis not present

## 2020-10-23 DIAGNOSIS — R0989 Other specified symptoms and signs involving the circulatory and respiratory systems: Secondary | ICD-10-CM | POA: Diagnosis not present

## 2020-10-23 DIAGNOSIS — M25562 Pain in left knee: Secondary | ICD-10-CM | POA: Diagnosis not present

## 2020-10-23 DIAGNOSIS — M79609 Pain in unspecified limb: Secondary | ICD-10-CM | POA: Diagnosis not present

## 2020-10-23 DIAGNOSIS — G629 Polyneuropathy, unspecified: Secondary | ICD-10-CM | POA: Diagnosis not present

## 2020-10-23 DIAGNOSIS — G894 Chronic pain syndrome: Secondary | ICD-10-CM | POA: Diagnosis not present

## 2020-10-23 DIAGNOSIS — M25561 Pain in right knee: Secondary | ICD-10-CM | POA: Diagnosis not present

## 2020-10-23 DIAGNOSIS — M5137 Other intervertebral disc degeneration, lumbosacral region: Secondary | ICD-10-CM | POA: Diagnosis not present

## 2020-10-23 DIAGNOSIS — R55 Syncope and collapse: Secondary | ICD-10-CM | POA: Diagnosis not present

## 2020-10-29 DIAGNOSIS — N950 Postmenopausal bleeding: Secondary | ICD-10-CM | POA: Diagnosis not present

## 2020-10-29 DIAGNOSIS — E559 Vitamin D deficiency, unspecified: Secondary | ICD-10-CM | POA: Diagnosis not present

## 2020-10-29 DIAGNOSIS — N951 Menopausal and female climacteric states: Secondary | ICD-10-CM | POA: Diagnosis not present

## 2020-10-31 ENCOUNTER — Other Ambulatory Visit: Payer: Self-pay | Admitting: Internal Medicine

## 2020-10-31 DIAGNOSIS — N925 Other specified irregular menstruation: Secondary | ICD-10-CM | POA: Diagnosis not present

## 2020-10-31 DIAGNOSIS — K219 Gastro-esophageal reflux disease without esophagitis: Secondary | ICD-10-CM | POA: Diagnosis not present

## 2020-10-31 DIAGNOSIS — J302 Other seasonal allergic rhinitis: Secondary | ICD-10-CM | POA: Diagnosis not present

## 2020-10-31 DIAGNOSIS — Z6834 Body mass index (BMI) 34.0-34.9, adult: Secondary | ICD-10-CM | POA: Diagnosis not present

## 2020-10-31 DIAGNOSIS — I10 Essential (primary) hypertension: Secondary | ICD-10-CM | POA: Diagnosis not present

## 2020-10-31 DIAGNOSIS — R7303 Prediabetes: Secondary | ICD-10-CM | POA: Diagnosis not present

## 2020-11-01 LAB — EXTRA LAV TOP TUBE

## 2020-11-01 LAB — HCG, SERUM, QUALITATIVE: Preg, Serum: NEGATIVE

## 2020-11-03 DIAGNOSIS — K219 Gastro-esophageal reflux disease without esophagitis: Secondary | ICD-10-CM | POA: Diagnosis not present

## 2020-11-03 DIAGNOSIS — J019 Acute sinusitis, unspecified: Secondary | ICD-10-CM | POA: Diagnosis not present

## 2020-11-03 DIAGNOSIS — M25512 Pain in left shoulder: Secondary | ICD-10-CM | POA: Diagnosis not present

## 2020-11-03 DIAGNOSIS — I10 Essential (primary) hypertension: Secondary | ICD-10-CM | POA: Diagnosis not present

## 2020-11-06 DIAGNOSIS — M25562 Pain in left knee: Secondary | ICD-10-CM | POA: Diagnosis not present

## 2020-11-06 DIAGNOSIS — R55 Syncope and collapse: Secondary | ICD-10-CM | POA: Diagnosis not present

## 2020-11-06 DIAGNOSIS — M25519 Pain in unspecified shoulder: Secondary | ICD-10-CM | POA: Diagnosis not present

## 2020-11-06 DIAGNOSIS — M170 Bilateral primary osteoarthritis of knee: Secondary | ICD-10-CM | POA: Diagnosis not present

## 2020-11-11 DIAGNOSIS — M25562 Pain in left knee: Secondary | ICD-10-CM | POA: Diagnosis not present

## 2020-11-11 DIAGNOSIS — M5137 Other intervertebral disc degeneration, lumbosacral region: Secondary | ICD-10-CM | POA: Diagnosis not present

## 2020-11-11 DIAGNOSIS — R519 Headache, unspecified: Secondary | ICD-10-CM | POA: Diagnosis not present

## 2020-11-11 DIAGNOSIS — M542 Cervicalgia: Secondary | ICD-10-CM | POA: Diagnosis not present

## 2020-11-11 DIAGNOSIS — M25511 Pain in right shoulder: Secondary | ICD-10-CM | POA: Diagnosis not present

## 2020-11-11 DIAGNOSIS — R55 Syncope and collapse: Secondary | ICD-10-CM | POA: Diagnosis not present

## 2020-11-11 DIAGNOSIS — M79609 Pain in unspecified limb: Secondary | ICD-10-CM | POA: Diagnosis not present

## 2020-11-11 DIAGNOSIS — G894 Chronic pain syndrome: Secondary | ICD-10-CM | POA: Diagnosis not present

## 2020-11-11 DIAGNOSIS — M545 Low back pain, unspecified: Secondary | ICD-10-CM | POA: Diagnosis not present

## 2020-11-11 DIAGNOSIS — R0989 Other specified symptoms and signs involving the circulatory and respiratory systems: Secondary | ICD-10-CM | POA: Diagnosis not present

## 2020-11-11 DIAGNOSIS — M25561 Pain in right knee: Secondary | ICD-10-CM | POA: Diagnosis not present

## 2020-11-11 DIAGNOSIS — M170 Bilateral primary osteoarthritis of knee: Secondary | ICD-10-CM | POA: Diagnosis not present

## 2020-11-12 ENCOUNTER — Other Ambulatory Visit: Payer: Self-pay | Admitting: Internal Medicine

## 2020-11-12 DIAGNOSIS — J302 Other seasonal allergic rhinitis: Secondary | ICD-10-CM | POA: Diagnosis not present

## 2020-11-12 DIAGNOSIS — G894 Chronic pain syndrome: Secondary | ICD-10-CM | POA: Diagnosis not present

## 2020-11-12 DIAGNOSIS — I10 Essential (primary) hypertension: Secondary | ICD-10-CM | POA: Diagnosis not present

## 2020-11-12 DIAGNOSIS — Z741 Need for assistance with personal care: Secondary | ICD-10-CM | POA: Diagnosis not present

## 2020-11-12 LAB — TIQ- AMBIGUOUS ORDER

## 2020-11-19 DIAGNOSIS — Z419 Encounter for procedure for purposes other than remedying health state, unspecified: Secondary | ICD-10-CM | POA: Diagnosis not present

## 2020-11-19 DIAGNOSIS — M25512 Pain in left shoulder: Secondary | ICD-10-CM | POA: Diagnosis not present

## 2020-11-19 DIAGNOSIS — G894 Chronic pain syndrome: Secondary | ICD-10-CM | POA: Diagnosis not present

## 2020-11-21 DIAGNOSIS — G894 Chronic pain syndrome: Secondary | ICD-10-CM | POA: Diagnosis not present

## 2020-11-21 DIAGNOSIS — I10 Essential (primary) hypertension: Secondary | ICD-10-CM | POA: Diagnosis not present

## 2020-11-21 DIAGNOSIS — M25512 Pain in left shoulder: Secondary | ICD-10-CM | POA: Diagnosis not present

## 2020-11-21 DIAGNOSIS — G43909 Migraine, unspecified, not intractable, without status migrainosus: Secondary | ICD-10-CM | POA: Diagnosis not present

## 2020-11-25 DIAGNOSIS — M5137 Other intervertebral disc degeneration, lumbosacral region: Secondary | ICD-10-CM | POA: Diagnosis not present

## 2020-11-25 DIAGNOSIS — M25561 Pain in right knee: Secondary | ICD-10-CM | POA: Diagnosis not present

## 2020-11-25 DIAGNOSIS — G894 Chronic pain syndrome: Secondary | ICD-10-CM | POA: Diagnosis not present

## 2020-11-25 DIAGNOSIS — R0989 Other specified symptoms and signs involving the circulatory and respiratory systems: Secondary | ICD-10-CM | POA: Diagnosis not present

## 2020-11-25 DIAGNOSIS — M79609 Pain in unspecified limb: Secondary | ICD-10-CM | POA: Diagnosis not present

## 2020-11-25 DIAGNOSIS — R7303 Prediabetes: Secondary | ICD-10-CM | POA: Diagnosis not present

## 2020-11-25 DIAGNOSIS — M25562 Pain in left knee: Secondary | ICD-10-CM | POA: Diagnosis not present

## 2020-11-25 DIAGNOSIS — R55 Syncope and collapse: Secondary | ICD-10-CM | POA: Diagnosis not present

## 2020-11-25 DIAGNOSIS — Z6831 Body mass index (BMI) 31.0-31.9, adult: Secondary | ICD-10-CM | POA: Diagnosis not present

## 2020-11-25 DIAGNOSIS — M25511 Pain in right shoulder: Secondary | ICD-10-CM | POA: Diagnosis not present

## 2020-11-25 DIAGNOSIS — M542 Cervicalgia: Secondary | ICD-10-CM | POA: Diagnosis not present

## 2020-11-27 DIAGNOSIS — G43909 Migraine, unspecified, not intractable, without status migrainosus: Secondary | ICD-10-CM | POA: Diagnosis not present

## 2020-11-27 DIAGNOSIS — I10 Essential (primary) hypertension: Secondary | ICD-10-CM | POA: Diagnosis not present

## 2020-11-27 DIAGNOSIS — G894 Chronic pain syndrome: Secondary | ICD-10-CM | POA: Diagnosis not present

## 2020-11-27 DIAGNOSIS — R7303 Prediabetes: Secondary | ICD-10-CM | POA: Diagnosis not present

## 2020-12-02 ENCOUNTER — Encounter (HOSPITAL_COMMUNITY): Payer: Self-pay

## 2020-12-02 ENCOUNTER — Emergency Department (HOSPITAL_COMMUNITY)
Admission: EM | Admit: 2020-12-02 | Discharge: 2020-12-03 | Disposition: A | Discharge status: Home / Self Care | Payer: Medicaid Other | Attending: Emergency Medicine | Admitting: Emergency Medicine

## 2020-12-02 ENCOUNTER — Emergency Department (HOSPITAL_COMMUNITY): Payer: Medicaid Other

## 2020-12-02 DIAGNOSIS — G43809 Other migraine, not intractable, without status migrainosus: Secondary | ICD-10-CM | POA: Insufficient documentation

## 2020-12-02 DIAGNOSIS — R4020 Unspecified coma: Secondary | ICD-10-CM | POA: Diagnosis not present

## 2020-12-02 DIAGNOSIS — Z79899 Other long term (current) drug therapy: Secondary | ICD-10-CM | POA: Diagnosis not present

## 2020-12-02 DIAGNOSIS — M25512 Pain in left shoulder: Secondary | ICD-10-CM | POA: Diagnosis not present

## 2020-12-02 DIAGNOSIS — Z743 Need for continuous supervision: Secondary | ICD-10-CM | POA: Diagnosis not present

## 2020-12-02 DIAGNOSIS — R55 Syncope and collapse: Secondary | ICD-10-CM | POA: Diagnosis not present

## 2020-12-02 DIAGNOSIS — I10 Essential (primary) hypertension: Secondary | ICD-10-CM | POA: Diagnosis not present

## 2020-12-02 DIAGNOSIS — R112 Nausea with vomiting, unspecified: Secondary | ICD-10-CM | POA: Diagnosis not present

## 2020-12-02 DIAGNOSIS — G43909 Migraine, unspecified, not intractable, without status migrainosus: Secondary | ICD-10-CM | POA: Diagnosis not present

## 2020-12-02 DIAGNOSIS — R519 Headache, unspecified: Secondary | ICD-10-CM | POA: Diagnosis not present

## 2020-12-02 DIAGNOSIS — R4182 Altered mental status, unspecified: Secondary | ICD-10-CM | POA: Diagnosis not present

## 2020-12-02 DIAGNOSIS — R404 Transient alteration of awareness: Secondary | ICD-10-CM | POA: Diagnosis not present

## 2020-12-02 LAB — DRUG MONITOR,OPIOIDS PNL,QN,URINE
Buprenorphine: NEGATIVE ng/mL (ref <2)
Codeine: NEGATIVE ng/mL (ref <50)
Dextromethorphan: NEGATIVE ng/mL (ref <20)
Dextrorphan: NEGATIVE ng/mL (ref <20)
EDDP: NEGATIVE ng/mL (ref <100)
Fentanyl: NEGATIVE ng/mL (ref <0.5)
Heroin Metabolite: NEGATIVE ng/mL (ref <10)
Hydrocodone: NEGATIVE ng/mL (ref <50)
Hydromorphone: NEGATIVE ng/mL (ref <50)
Methadone: NEGATIVE ng/mL (ref <100)
Mitragynine: NEGATIVE ng/mL (ref <2)
Morphine: NEGATIVE ng/mL (ref <50)
Naloxone: NEGATIVE ng/mL (ref <2)
Norbuprenorphine: NEGATIVE ng/mL (ref <2)
Norfentanyl: NEGATIVE ng/mL (ref <0.5)
Norhydrocodone: NEGATIVE ng/mL (ref <50)
Noroxycodone: 556 ng/mL — ABNORMAL HIGH (ref <50)
Nortapentadol: NEGATIVE ng/mL (ref <50)
O desmethyltramadol: NEGATIVE ng/mL (ref <100)
Oxycodone: NEGATIVE ng/mL (ref <50)
Oxymorphone: 980 ng/mL — ABNORMAL HIGH (ref <50)
Tapentadol: NEGATIVE ng/mL (ref <50)
Tramadol: NEGATIVE ng/mL (ref <100)

## 2020-12-02 LAB — BASIC METABOLIC PANEL
Anion gap: 9 (ref 5–15)
BUN: 12 mg/dL (ref 6–20)
CO2: 26 mmol/L (ref 22–32)
Calcium: 9.1 mg/dL (ref 8.9–10.3)
Chloride: 102 mmol/L (ref 98–111)
Creatinine, Ser: 1.32 mg/dL — ABNORMAL HIGH (ref 0.44–1.00)
GFR, Estimated: 47 mL/min — ABNORMAL LOW (ref >60)
Glucose, Bld: 107 mg/dL — ABNORMAL HIGH (ref 70–99)
Potassium: 3.2 mmol/L — ABNORMAL LOW (ref 3.5–5.1)
Sodium: 137 mmol/L (ref 135–145)

## 2020-12-02 LAB — CBC WITH DIFFERENTIAL/PLATELET
Abs Immature Granulocytes: 0.03 10*3/uL (ref 0.00–0.07)
Basophils Absolute: 0.1 10*3/uL (ref 0.0–0.1)
Basophils Relative: 1 %
Eosinophils Absolute: 0.1 10*3/uL (ref 0.0–0.5)
Eosinophils Relative: 3 %
HCT: 49.5 % — ABNORMAL HIGH (ref 36.0–46.0)
Hemoglobin: 14.8 g/dL (ref 12.0–15.0)
Immature Granulocytes: 1 %
Lymphocytes Relative: 42 %
Lymphs Abs: 1.6 10*3/uL (ref 0.7–4.0)
MCH: 31.3 pg (ref 26.0–34.0)
MCHC: 29.9 g/dL — ABNORMAL LOW (ref 30.0–36.0)
MCV: 104.7 fL — ABNORMAL HIGH (ref 80.0–100.0)
Monocytes Absolute: 0.3 10*3/uL (ref 0.1–1.0)
Monocytes Relative: 7 %
Neutro Abs: 1.8 10*3/uL (ref 1.7–7.7)
Neutrophils Relative %: 46 %
Platelets: 219 10*3/uL (ref 150–400)
RBC: 4.73 MIL/uL (ref 3.87–5.11)
RDW: 14 % (ref 11.5–15.5)
WBC: 3.9 10*3/uL — ABNORMAL LOW (ref 4.0–10.5)
nRBC: 0 % (ref 0.0–0.2)

## 2020-12-02 LAB — DRUG MONITOR,OPIOIDS PNL,W/CONF,URINE
Buprenorphine: NEGATIVE ng/mL (ref <2)
Codeine: NEGATIVE ng/mL (ref <50)
Fentanyl: NEGATIVE ng/mL (ref <0.5)
Heroin Metabolite: NEGATIVE ng/mL (ref <10)
Hydrocodone: NEGATIVE ng/mL (ref <50)
Hydromorphone: NEGATIVE ng/mL (ref <50)
Methadone: NEGATIVE ng/mL (ref <100)
Methorphan: NEGATIVE ng/mL (ref <20)
Mitragynine: NEGATIVE ng/mL (ref <2)
Morphine: NEGATIVE ng/mL (ref <50)
Norhydrocodone: NEGATIVE ng/mL (ref <50)
Noroxycodone: 556 ng/mL — ABNORMAL HIGH (ref <50)
Opiates: POSITIVE ng/mL — AB (ref <50)
Oxycodone: NEGATIVE ng/mL (ref <50)
Oxymorphone: 980 ng/mL — ABNORMAL HIGH (ref <50)
Tapentadol: NEGATIVE ng/mL (ref <50)
Tramadol: NEGATIVE ng/mL (ref <100)

## 2020-12-02 LAB — TEST AUTHORIZATION

## 2020-12-02 LAB — TROPONIN I (HIGH SENSITIVITY): Troponin I (High Sensitivity): 4 ng/L (ref <18)

## 2020-12-02 LAB — SPECIMEN ID NOTIFICATION MISSING 2ND ID

## 2020-12-02 LAB — DM TEMPLATE

## 2020-12-02 MED ORDER — DEXAMETHASONE SODIUM PHOSPHATE 4 MG/ML IJ SOLN
4.0000 mg | Freq: Once | INTRAMUSCULAR | Status: AC
  Administered 2020-12-02: 4 mg via INTRAVENOUS
  Filled 2020-12-02: qty 1

## 2020-12-02 MED ORDER — PROMETHAZINE HCL 25 MG RE SUPP: 25.0000 mg | Freq: Three times a day (TID) | RECTAL | 0 refills | Status: DC | PRN

## 2020-12-02 MED ORDER — SODIUM CHLORIDE 0.9 % IV SOLN
12.5000 mg | Freq: Once | INTRAVENOUS | Status: DC
  Filled 2020-12-02: qty 0.5

## 2020-12-02 MED ORDER — MAGNESIUM SULFATE IN D5W 1-5 GM/100ML-% IV SOLN
1.0000 g | Freq: Once | INTRAVENOUS | Status: AC
  Administered 2020-12-02: 1 g via INTRAVENOUS
  Filled 2020-12-02: qty 100

## 2020-12-02 MED ORDER — PROMETHAZINE HCL 25 MG RE SUPP
25.0000 mg | Freq: Four times a day (QID) | RECTAL | Status: DC | PRN
  Administered 2020-12-03: 25 mg via RECTAL
  Filled 2020-12-02 (×2): qty 1

## 2020-12-02 MED ORDER — DIPHENHYDRAMINE HCL 50 MG/ML IJ SOLN
12.5000 mg | Freq: Once | INTRAMUSCULAR | Status: AC
  Administered 2020-12-02: 12.5 mg via INTRAVENOUS
  Filled 2020-12-02: qty 1

## 2020-12-02 MED ORDER — METOCLOPRAMIDE HCL 5 MG/ML IJ SOLN
10.0000 mg | Freq: Once | INTRAMUSCULAR | Status: AC
  Administered 2020-12-02: 10 mg via INTRAVENOUS
  Filled 2020-12-02: qty 2

## 2020-12-02 NOTE — Discharge Instructions (Addendum)
Please call your neurologist office tomorrow to discuss your migraine management.  I prescribed you some more rectal Phenergan that you can use at home for bad nausea.  Otherwise you can continue taking your current migraine medications.  You were also evaluated in the ER for an episode of loss of consciousness, or syncope.  Your cardiac work-up and CT scan of the brain did not show any immediate emergencies.  I would strongly recommend you call your primary care provider tomorrow to discuss this episode and further follow-up care.  You should avoid driving for the next several days, or until your PCP feels it is safe to do so again.

## 2020-12-02 NOTE — ED Provider Notes (Signed)
[facility]-EMERGENCY DEPT Provider Note   CSN: [id number] Arrival date & time: 12/02/20  1955     History Chief Complaint  Patient presents with   Migraine    Kirsten Walsh is a 60 y.o. female with history of complex migraines, syncope, presented emergency department with headache and syncopal episode.  The patient reports that she was at [facility] pharmacy picking up her medications and she had a loss of consciousness.  She does not recall the actual incident.  She reports she was having a headache beforehand.  She states that this is happened before with her migraines.  She currently does have a migraine in its typical pattern.  She denies any history of MI, arrhythmia, cardiac disease.  She denies any chest pain or shortness of breath.  She does report that she feels "woozy".  She has been seen in the ED for similar presentation in the past.  She had multiple unremarkable CT scans of the brain.  She is not on blood thinners.  HPI     Past Medical History:  Diagnosis Date   Chest pain    Chronic lower back pain    "right side to mid back" (07/06/2017)   Family history of adverse reaction to anesthesia    "daughter did; not sure happened"   Gall stones    History of anaphylaxis 06/26/2014   Hypertension    Kidney stone ~ 2006   Migraine    "qd" (07/06/2017)   Syncope and collapse 07/06/2017    sitting on the commode; developed nausea and passed out; hit head on shower and suffered a laceration; woke up in "a pool of blood"    Patient Active Problem List   Diagnosis Date Noted   Cervical cancer screening 03/09/2020   Encounter for administration of vaccine 03/09/2020   Preoperative clearance 07/24/2019   Right arm pain 06/12/2019   Syncope 07/06/2017   Chronic daily headache 09/02/2015   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   Syncopal episodes 09/02/2015   Right arm weakness 08/05/2015   Edema of right arm and right leg 09/07/2014   Intractable  migraine with status migrainosus 06/10/2014   Chronic migraine without aura, intractable, with status migrainosus    Bradycardia 02/18/2013   Left knee pain 01/16/2013   Cardiomyopathy 10/20/2011   Leg swelling 09/23/2011   Complex regional pain syndrome of right lower extremity 05/03/2011   Chronic pain 03/29/2011   NECK PAIN, RIGHT 02/03/2010   ALLERGIC RHINITIS 01/30/2009   DIZZINESS 07/16/2008   Chronic migraine 04/26/2007   OBESITY, NOS 08/18/2006   HYPERTENSION, BENIGN SYSTEMIC 08/18/2006    Past Surgical History:  Procedure Laterality Date   CHONDROPLASTY Left 08/17/2019   Procedure: CHONDROPLASTY;  Surgeon: Frederico Hamman, MD;  Location: [facility];  Service: Orthopedics;  Laterality: Left;   CYSTOSCOPY W/ STONE MANIPULATION  ~ 2006   INTRAUTERINE DEVICE INSERTION     "initially put in in 04/2002; changed prn" (02/14/2013)   KNEE ARTHROSCOPY WITH LATERAL MENISECTOMY  08/17/2019   Procedure: KNEE ARTHROSCOPY WITH LATERAL MENISECTOMY;  Surgeon: Frederico Hamman, MD;  Location: [facility];  Service: Orthopedics;;   KNEE ARTHROSCOPY WITH MEDIAL MENISECTOMY Left 08/17/2019   Procedure: KNEE ARTHROSCOPY WITH MEDIAL MENISECTOMY;  Surgeon: Frederico Hamman, MD;  Location: [facility];  Service: Orthopedics;  Laterality: Left;   LAPAROSCOPIC CHOLECYSTECTOMY  ~ 2004     OB History   No obstetric history on file.     Family History  Problem Relation Age of Onset   Cancer Mother    Pancreatic cancer Mother    Cirrhosis Father    Lupus Sister    Diabetes Maternal Grandmother     Social History   Tobacco Use   Smoking status: Never   Smokeless tobacco: Never  Vaping Use   Vaping Use: Never used  Substance Use Topics   Alcohol use: Yes    Comment: 07/06/2017 "couple mixed drinks/month"   Drug use: No    Home Medications Prior to Admission medications   Medication Sig Start Date End Date Taking? Authorizing Provider   promethazine (PHENERGAN) 25 MG suppository Place 1 suppository (25 mg total) rectally every 8 (eight) hours as needed for up to 6 doses for nausea or vomiting. 12/02/20  Yes Daija Routson, Kermit Balo, MD  amitriptyline (ELAVIL) 25 MG tablet Take 25 mg by mouth at bedtime as needed for sleep. 08/26/20   [provider]  Butalbital-APAP-Caffeine 50-325-40 MG capsule Take 1 capsule by mouth every 6 (six) hours as needed for headache (migraine). 09/04/20   [provider]  cloNIDine (CATAPRES) 0.1 MG tablet TAKE 1 TABLET(0.1 MG) BY MOUTH TWICE DAILY Patient taking differently: Take 0.1 mg by mouth at bedtime. 08/28/18   Howard Pouch, MD  Diclofenac Sodium 3 % GEL Apply 1 application topically 2 (two) times daily. 09/06/20   [provider]  EPINEPHrine 0.3 mg/0.3 mL IJ SOAJ injection Inject 0.3 mg into the muscle as needed for anaphylaxis. 09/18/20   [provider]  gabapentin (NEURONTIN) 300 MG capsule Take 300 mg by mouth 2 (two) times daily. 08/26/20   [provider]  hydrochlorothiazide (HYDRODIURIL) 25 MG tablet TAKE 1 TABLET(25 MG) BY MOUTH DAILY Patient taking differently: Take 25 mg by mouth daily. 08/28/18   Howard Pouch, MD  naloxone [facility]) nasal spray 4 mg/0.1 mL Place 1 spray into the nose once.    [provider]  ondansetron (ZOFRAN) 4 MG tablet Take 4 mg by mouth 3 (three) times daily as needed for nausea or vomiting.    [provider]  oxyCODONE (ROXICODONE) 15 MG immediate release tablet Take 15 mg by mouth every 12 (twelve) hours. 09/04/20   [provider]  Phendimetrazine Tartrate 105 MG CP24 Take 105 mg by mouth every morning. 09/21/20   [provider]  polyethylene glycol (MIRALAX / GLYCOLAX) 17 g packet Take 17 g by mouth daily.    [provider]  potassium chloride (KLOR-CON) 10 MEQ tablet Take 2 tablets (20 mEq total) by mouth daily. 02/20/20   Dana Allan, MD  promethazine (PHENERGAN) 25 MG tablet Take 1  tablet (25 mg total) by mouth every 6 (six) hours as needed for nausea or vomiting. Patient not taking: Reported on 10/17/2020 04/30/19   Wieters, Hallie C, PA-C  topiramate (TOPAMAX) 100 MG tablet TAKE 1 AND 1/2 TABLETS(150 MG) BY MOUTH TWICE DAILY Patient taking differently: Take 150 mg by mouth 2 (two) times daily. 08/01/18   Howard Pouch, MD  Vitamin D, Ergocalciferol, (DRISDOL) 1.25 MG (50000 UNIT) CAPS capsule Take 50,000 Units by mouth every 7 (seven) days. 09/14/20   [provider]  potassium chloride SA (KLOR-CON) 20 MEQ tablet Take 1 tablet (20 mEq total) by mouth daily for 3 days. 04/05/19 04/30/19  Pricilla Loveless, MD  SUMAtriptan (IMITREX) 100 MG tablet Take 1 tablet (100 mg total) by mouth once. May repeat in 2 hours if headache persists or recurs. 01/31/17 08/22/19  Almon Hercules, MD  Allergies    Apple, Aspirin, Daucus carota, and Haloperidol and related  Review of Systems   Review of Systems  Constitutional:  Negative for chills and fever.  Respiratory:  Negative for cough and shortness of breath.   Cardiovascular:  Negative for chest pain and palpitations.  Gastrointestinal:  Negative for abdominal pain and vomiting.  Musculoskeletal:  Negative for arthralgias and back pain.  Skin:  Negative for color change and rash.  Neurological:  Positive for dizziness, syncope, light-headedness and headaches. Negative for weakness and numbness.  All other systems reviewed and are negative.  Physical Exam Updated Vital Signs BP (!) 142/128   Pulse 64   Temp 98.6 F (37 C) (Oral)   Resp 18   Ht 5\' 5"  (1.651 m)   Wt 89.8 kg   SpO2 99%   BMI 32.95 kg/m   Physical Exam Constitutional:      General: She is not in acute distress. HENT:     Head: Normocephalic and atraumatic.  Eyes:     Conjunctiva/sclera: Conjunctivae normal.     Pupils: Pupils are equal, round, and reactive to light.  Cardiovascular:     Rate and Rhythm: Normal rate and regular rhythm.  Pulmonary:      Effort: Pulmonary effort is normal. No respiratory distress.  Abdominal:     General: There is no distension.     Tenderness: There is no abdominal tenderness.  Skin:    General: Skin is warm and dry.  Neurological:     General: No focal deficit present.     Mental Status: She is alert and oriented to person, place, and time. Mental status is at baseline.  Psychiatric:        Mood and Affect: Mood normal.        Behavior: Behavior normal.    ED Results / Procedures / Treatments   Labs (all labs ordered are listed, but only abnormal results are displayed) Labs Reviewed  BASIC METABOLIC PANEL - Abnormal; Notable for the following components:      Result Value   Potassium 3.2 (*)    Glucose, Bld 107 (*)    Creatinine, Ser 1.32 (*)    GFR, Estimated 47 (*)    All other components within normal limits  CBC WITH DIFFERENTIAL/PLATELET - Abnormal; Notable for the following components:   WBC 3.9 (*)    HCT 49.5 (*)    MCV 104.7 (*)    MCHC 29.9 (*)    All other components within normal limits  TROPONIN I (HIGH SENSITIVITY)    EKG EKG Interpretation  Date/Time:  Tuesday December 02 2020 20:08:19 EDT Ventricular Rate:  66 PR Interval:  152 QRS Duration: 93 QT Interval:  437 QTC Calculation: 458 R Axis:   2 Text Interpretation: No STEMI Confirmed by Alvester Chourifan, Iyauna Sing [phone]([id number]) on 12/02/2020 10:53:50 PM  Radiology CT Head Wo Contrast  Result Date: 12/02/2020 CLINICAL DATA:  Mental status changes, syncope, headache EXAM: CT HEAD WITHOUT CONTRAST TECHNIQUE: Contiguous axial images were obtained from the base of the skull through the vertex without intravenous contrast. COMPARISON:  09/24/2020 FINDINGS: Brain: No acute intracranial abnormality. Specifically, no hemorrhage, hydrocephalus, mass lesion, acute infarction, or significant intracranial injury. Vascular: No hyperdense vessel or unexpected calcification. Skull: No acute calvarial abnormality. Sinuses/Orbits: No acute findings Other:  None IMPRESSION: Normal study. Electronically Signed   By: Charlett NoseKevin  Dover M.D.   On: 12/02/2020 21:47    Procedures Procedures   Medications Ordered in ED Medications  metoCLOPramide (REGLAN)  injection 10 mg (10 mg Intravenous Given 12/02/20 2117)  dexamethasone (DECADRON) injection 4 mg (4 mg Intravenous Given 12/02/20 2117)  magnesium sulfate IVPB 1 g 100 mL (0 g Intravenous Stopped 12/03/20 0014)  diphenhydrAMINE (BENADRYL) injection 12.5 mg (12.5 mg Intravenous Given 12/02/20 2117)  oxyCODONE (Oxy IR/ROXICODONE) immediate release tablet 15 mg (15 mg Oral Given 12/03/20 0010)    ED Course  I have reviewed the triage vital signs and the nursing notes.  Pertinent labs & imaging results that were available during my care of the patient were reviewed by me and considered in my medical decision making (see chart for details).  This patient complains of syncope, migraine.  This involves an extensive number of treatment options, and is a complaint that carries with it a high risk of complications and morbidity.  The differential diagnosis includes complex migraine vs vasovagal episodes vs SAH vs arrhythmia vs other  CTH obtained on arrival, within 6 hours of syncopal episode -no evidence of SAH.  Doubt brain hemorrhage.  This is more likely a headache from her complex migraines - she is on multiple migraine medications at home, including oxycodone per her report.  Syncope - may be vasovagal - similar episodes noted on chart review of the past, particularly in setting of pain.  Echo in Jan 2019 with EF 60-65% and otherwise unremarkable.  ECG unremarkable here -trop 4.  Doubt ACS, PE, arrhythmia.  Doubt acute anemia or infection.  IV migraine medications ordered Prior medical records reviewed    Clinical Course as of 12/03/20 0946  Tue Dec 02, 2020  2333 Headache is improving but she continues to feel extremely nauseated.  We discussed a dose of Phenergan which she said has been successful for her  in the past.  She did lose her IV access, and we have not been able to replace anyone.  However I think rectal Phenergan would be a good option, as this can also be prescribed for home.  She is willing to try this. [MT]  2334 I anticipate discharge home for complex migraine after she has received her medications. [MT]    Clinical Course User Index [MT] Terald Sleeper, MD    Final Clinical Impression(s) / ED Diagnoses Final diagnoses:  Other migraine without status migrainosus, not intractable  Syncope, unspecified syncope type    Rx / DC Orders ED Discharge Orders          Ordered    promethazine (PHENERGAN) 25 MG suppository  Every 8 hours PRN        12/02/20 2336             Terald Sleeper, MD 12/03/20 [phone]

## 2020-12-02 NOTE — ED Notes (Signed)
Back from CT

## 2020-12-02 NOTE — ED Notes (Signed)
To CT

## 2020-12-02 NOTE — ED Notes (Signed)
Patient is asleep and snoring.

## 2020-12-02 NOTE — ED Triage Notes (Signed)
Pt was at [facility] pharmacy and the staff said that she passed out and she did fall, pt doesn't remember the incident, pt however does not complain of any injury Pt wasn't coming to the ED but was unable to walk and felt off balance

## 2020-12-03 MED ORDER — OXYCODONE HCL 5 MG PO TABS
15.0000 mg | ORAL_TABLET | Freq: Once | ORAL | Status: AC
  Administered 2020-12-03: 15 mg via ORAL
  Filled 2020-12-03: qty 3

## 2020-12-03 NOTE — ED Notes (Signed)
Patient ambulated to the bathroom with assistance.

## 2020-12-05 ENCOUNTER — Other Ambulatory Visit (HOSPITAL_COMMUNITY): Payer: Medicaid Other

## 2020-12-05 DIAGNOSIS — M25512 Pain in left shoulder: Secondary | ICD-10-CM | POA: Diagnosis not present

## 2020-12-10 DIAGNOSIS — M170 Bilateral primary osteoarthritis of knee: Secondary | ICD-10-CM | POA: Diagnosis not present

## 2020-12-11 DIAGNOSIS — I10 Essential (primary) hypertension: Secondary | ICD-10-CM | POA: Diagnosis not present

## 2020-12-11 DIAGNOSIS — M25512 Pain in left shoulder: Secondary | ICD-10-CM | POA: Diagnosis not present

## 2020-12-11 DIAGNOSIS — R7303 Prediabetes: Secondary | ICD-10-CM | POA: Diagnosis not present

## 2020-12-11 DIAGNOSIS — M179 Osteoarthritis of knee, unspecified: Secondary | ICD-10-CM | POA: Diagnosis not present

## 2020-12-11 DIAGNOSIS — K5904 Chronic idiopathic constipation: Secondary | ICD-10-CM | POA: Diagnosis not present

## 2020-12-18 DIAGNOSIS — F4542 Pain disorder with related psychological factors: Secondary | ICD-10-CM | POA: Diagnosis not present

## 2020-12-19 DIAGNOSIS — Z419 Encounter for procedure for purposes other than remedying health state, unspecified: Secondary | ICD-10-CM | POA: Diagnosis not present

## 2020-12-19 DIAGNOSIS — G894 Chronic pain syndrome: Secondary | ICD-10-CM | POA: Diagnosis not present

## 2020-12-24 DIAGNOSIS — G894 Chronic pain syndrome: Secondary | ICD-10-CM | POA: Diagnosis not present

## 2020-12-24 DIAGNOSIS — M25562 Pain in left knee: Secondary | ICD-10-CM | POA: Diagnosis not present

## 2020-12-24 DIAGNOSIS — M79609 Pain in unspecified limb: Secondary | ICD-10-CM | POA: Diagnosis not present

## 2020-12-24 DIAGNOSIS — M542 Cervicalgia: Secondary | ICD-10-CM | POA: Diagnosis not present

## 2020-12-24 DIAGNOSIS — M25561 Pain in right knee: Secondary | ICD-10-CM | POA: Diagnosis not present

## 2020-12-24 DIAGNOSIS — Z6831 Body mass index (BMI) 31.0-31.9, adult: Secondary | ICD-10-CM | POA: Diagnosis not present

## 2020-12-24 DIAGNOSIS — R55 Syncope and collapse: Secondary | ICD-10-CM | POA: Diagnosis not present

## 2020-12-24 DIAGNOSIS — M25511 Pain in right shoulder: Secondary | ICD-10-CM | POA: Diagnosis not present

## 2020-12-24 DIAGNOSIS — G501 Atypical facial pain: Secondary | ICD-10-CM | POA: Diagnosis not present

## 2020-12-24 DIAGNOSIS — M545 Low back pain, unspecified: Secondary | ICD-10-CM | POA: Diagnosis not present

## 2020-12-24 DIAGNOSIS — M170 Bilateral primary osteoarthritis of knee: Secondary | ICD-10-CM | POA: Diagnosis not present

## 2020-12-24 DIAGNOSIS — M5137 Other intervertebral disc degeneration, lumbosacral region: Secondary | ICD-10-CM | POA: Diagnosis not present

## 2020-12-25 DIAGNOSIS — G894 Chronic pain syndrome: Secondary | ICD-10-CM | POA: Diagnosis not present

## 2020-12-26 DIAGNOSIS — G894 Chronic pain syndrome: Secondary | ICD-10-CM | POA: Diagnosis not present

## 2020-12-27 DIAGNOSIS — G894 Chronic pain syndrome: Secondary | ICD-10-CM | POA: Diagnosis not present

## 2020-12-28 DIAGNOSIS — G894 Chronic pain syndrome: Secondary | ICD-10-CM | POA: Diagnosis not present

## 2020-12-31 DIAGNOSIS — M25512 Pain in left shoulder: Secondary | ICD-10-CM | POA: Diagnosis not present

## 2020-12-31 NOTE — H&P (Signed)
Patient's anticipated LOS is less than 2 midnights, meeting these requirements: - Younger than 70 - Lives within 1 hour of care - Has a competent adult at home to recover with post-op recover - NO history of  - Chronic pain requiring opiods  - Diabetes  - Coronary Artery Disease  - Heart failure  - Heart attack  - Stroke  - DVT/VTE  - Cardiac arrhythmia  - Respiratory Failure/COPD  - Renal failure  - Anemia  - Advanced Liver disease     Kirsten Walsh is an 60 y.o. female.    Chief Complaint: left knee pain   HPI: Pt is a 60 y.o. female complaining of left knee pain for multiple years. Pain had continually increased since the beginning. X-rays in the clinic show end-stage arthritic changes of the left knee. Pt has tried various conservative treatments which have failed to alleviate their symptoms, including injections and therapy. Various options are discussed with the patient. Risks, benefits and expectations were discussed with the patient. Patient understand the risks, benefits and expectations and wishes to proceed with surgery.   PCP:  Derrel Nip, MD  D/C Plans: Home  PMH: Past Medical History:  Diagnosis Date   Chest pain    Chronic lower back pain    "right side to mid back" (07/06/2017)   Family history of adverse reaction to anesthesia    "daughter did; not sure happened"   Gall stones    History of anaphylaxis 06/26/2014   Hypertension    Kidney stone ~ 2006   Migraine    "qd" (07/06/2017)   Syncope and collapse 07/06/2017    sitting on the commode; developed nausea and passed out; hit head on shower and suffered a laceration; woke up in "a pool of blood"    PSH: Past Surgical History:  Procedure Laterality Date   CHONDROPLASTY Left 08/17/2019   Procedure: CHONDROPLASTY;  Surgeon: Frederico Hamman, MD;  Location: [facility];  Service: Orthopedics;  Laterality: Left;   CYSTOSCOPY W/ STONE MANIPULATION  ~ 2006   INTRAUTERINE DEVICE  INSERTION     "initially put in in 04/2002; changed prn" (02/14/2013)   KNEE ARTHROSCOPY WITH LATERAL MENISECTOMY  08/17/2019   Procedure: KNEE ARTHROSCOPY WITH LATERAL MENISECTOMY;  Surgeon: Frederico Hamman, MD;  Location: [facility];  Service: Orthopedics;;   KNEE ARTHROSCOPY WITH MEDIAL MENISECTOMY Left 08/17/2019   Procedure: KNEE ARTHROSCOPY WITH MEDIAL MENISECTOMY;  Surgeon: Frederico Hamman, MD;  Location: [facility];  Service: Orthopedics;  Laterality: Left;   LAPAROSCOPIC CHOLECYSTECTOMY  ~ 2004    Social History:  reports that she has never smoked. She has never used smokeless tobacco. She reports current alcohol use. She reports that she does not use drugs.  Allergies:  Allergies  Allergen Reactions   Apple Anaphylaxis and Swelling   Aspirin Anaphylaxis and Swelling   Daucus Carota Anaphylaxis and Swelling   Haloperidol And Related Anaphylaxis, Swelling and Other (See Comments)    Lock jaw and slurred speech    Medications: No current facility-administered medications for this encounter.   Current Outpatient Medications  Medication Sig Dispense Refill   Butalbital-APAP-Caffeine 50-325-40 MG capsule Take 1 capsule by mouth every 6 (six) hours as needed for headache (migraine).     cetirizine (ZYRTEC) 10 MG tablet Take 10 mg by mouth daily as needed for allergies.     cloNIDine (CATAPRES) 0.1 MG tablet TAKE 1 TABLET(0.1 MG) BY MOUTH TWICE DAILY (Patient taking differently: Take 0.1  mg by mouth 2 (two) times daily.) 180 tablet 2   Diclofenac Sodium 3 % GEL Apply 1 application topically 2 (two) times daily as needed (pain).     EPINEPHrine 0.3 mg/0.3 mL IJ SOAJ injection Inject 0.3 mg into the muscle as needed for anaphylaxis.     fluticasone (FLONASE) 50 MCG/ACT nasal spray Place 1 spray into both nostrils daily as needed for allergies or rhinitis.     gabapentin (NEURONTIN) 300 MG capsule Take 300 mg by mouth 2 (two) times daily.      hydrochlorothiazide (HYDRODIURIL) 25 MG tablet TAKE 1 TABLET(25 MG) BY MOUTH DAILY (Patient taking differently: Take 25 mg by mouth daily.) 90 tablet 2   ondansetron (ZOFRAN) 4 MG tablet Take 4 mg by mouth 3 (three) times daily as needed for nausea or vomiting.     oxyCODONE (ROXICODONE) 15 MG immediate release tablet Take 15 mg by mouth every 12 (twelve) hours.     pantoprazole (PROTONIX) 40 MG tablet Take 40 mg by mouth in the morning.     polyethylene glycol (MIRALAX / GLYCOLAX) 17 g packet Take 17 g by mouth daily.     potassium chloride (KLOR-CON) 10 MEQ tablet Take 2 tablets (20 mEq total) by mouth daily. 60 tablet 0   promethazine (PHENERGAN) 25 MG tablet Take 1 tablet (25 mg total) by mouth every 6 (six) hours as needed for nausea or vomiting. 30 tablet 0   SUMAtriptan (IMITREX) 100 MG tablet Take 100 mg by mouth every 2 (two) hours as needed for migraine. May repeat in 2 hours if headache persists or recurs.     topiramate (TOPAMAX) 100 MG tablet TAKE 1 AND 1/2 TABLETS(150 MG) BY MOUTH TWICE DAILY (Patient taking differently: Take 150 mg by mouth 2 (two) times daily.) 270 tablet 2   traZODone (DESYREL) 50 MG tablet Take 50 mg by mouth at bedtime.     Vitamin D, Ergocalciferol, (DRISDOL) 1.25 MG (50000 UNIT) CAPS capsule Take 50,000 Units by mouth every Monday.     amitriptyline (ELAVIL) 25 MG tablet Take 25 mg by mouth at bedtime as needed for sleep.     Phendimetrazine Tartrate 105 MG CP24 Take 105 mg by mouth every morning.     promethazine (PHENERGAN) 25 MG suppository Place 1 suppository (25 mg total) rectally every 8 (eight) hours as needed for up to 6 doses for nausea or vomiting. 6 each 0    No results found for this or any previous visit (from the past 48 hour(s)). No results found.  ROS: Pain with rom of the left lower extremity  Physical Exam: Alert and oriented 60 y.o. female in no acute distress Cranial nerves 2-12 intact Cervical spine: full rom with no tenderness, nv  intact distally Chest: active breath sounds bilaterally, no wheeze rhonchi or rales Heart: regular rate and rhythm, no murmur Abd: non tender non distended with active bowel sounds Hip is stable with rom  Left knee with painful rom  Medial and lateral joint line tenderness Crepitus with rom  Assessment/Plan Assessment: left knee end stage osteoarthritis  Plan:  Patient will undergo a left total knee by Dr. Ranell Patrick at [place] Risks benefits and expectations were discussed with the patient. Patient understand risks, benefits and expectations and wishes to proceed. Preoperative templating of the joint replacement has been completed, documented, and submitted to the Operating Room personnel in order to optimize intra-operative equipment management.   Brad Nakshatra Klose PA-C, MPAS [employer] is now [employer]  [address], [address], [place], [place] [address] Phone: [phone] [email or web address] Facebook  [employer]

## 2021-01-01 DIAGNOSIS — M25562 Pain in left knee: Secondary | ICD-10-CM | POA: Diagnosis not present

## 2021-01-02 DIAGNOSIS — M2352 Chronic instability of knee, left knee: Secondary | ICD-10-CM | POA: Diagnosis not present

## 2021-01-05 ENCOUNTER — Other Ambulatory Visit: Payer: Self-pay

## 2021-01-05 ENCOUNTER — Encounter (HOSPITAL_COMMUNITY): Payer: Self-pay | Admitting: Orthopedic Surgery

## 2021-01-05 ENCOUNTER — Encounter (HOSPITAL_COMMUNITY)
Admission: RE | Admit: 2021-01-05 | Discharge: 2021-01-05 | Disposition: A | Discharge status: Home / Self Care | Payer: Medicaid Other | Source: Ambulatory Visit | Attending: Orthopedic Surgery | Admitting: Orthopedic Surgery

## 2021-01-05 DIAGNOSIS — Z01812 Encounter for preprocedural laboratory examination: Secondary | ICD-10-CM | POA: Insufficient documentation

## 2021-01-05 LAB — BASIC METABOLIC PANEL
Anion gap: 8 (ref 5–15)
BUN: 18 mg/dL (ref 6–20)
CO2: 27 mmol/L (ref 22–32)
Calcium: 9.4 mg/dL (ref 8.9–10.3)
Chloride: 104 mmol/L (ref 98–111)
Creatinine, Ser: 1.09 mg/dL — ABNORMAL HIGH (ref 0.44–1.00)
GFR, Estimated: 59 mL/min — ABNORMAL LOW (ref >60)
Glucose, Bld: 100 mg/dL — ABNORMAL HIGH (ref 70–99)
Potassium: 3.4 mmol/L — ABNORMAL LOW (ref 3.5–5.1)
Sodium: 139 mmol/L (ref 135–145)

## 2021-01-05 LAB — SURGICAL PCR SCREEN
MRSA, PCR: NEGATIVE
Staphylococcus aureus: NEGATIVE

## 2021-01-05 LAB — CBC
HCT: 43.6 % (ref 36.0–46.0)
Hemoglobin: 13.9 g/dL (ref 12.0–15.0)
MCH: 30.5 pg (ref 26.0–34.0)
MCHC: 31.9 g/dL (ref 30.0–36.0)
MCV: 95.6 fL (ref 80.0–100.0)
Platelets: 241 10*3/uL (ref 150–400)
RBC: 4.56 MIL/uL (ref 3.87–5.11)
RDW: 13.9 % (ref 11.5–15.5)
WBC: 4.3 10*3/uL (ref 4.0–10.5)
nRBC: 0 % (ref 0.0–0.2)

## 2021-01-05 NOTE — Progress Notes (Addendum)
  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  Dr Nobie Putnam- LOV 03/2020 and DR Avbuere.   Cardiologist : none  Chest x-ray : EKG : 12/02/20  Echo : 2091  Stress test: Cardiac Cath :  Activity level: can do a flight of stairs without difficutly  Sleep Study/ CPAP : none  Fasting Blood Sugar :      / Checks Blood Sugar -- times a day:   Blood Thinner/ Instructions /Last Dose: ASA / Instructions/ Last Dose :   In ED 12/02/2020 for migraine  PT takes Phendemetrazine  BMp done 01/05/21 routed to Dr Ranell Patrick.   LVMM for pt to call me in regards to Phendimetrazine to see if she is still taking or has she stopped.   Phendimetrazine- last dose was approximately on 12/22/2020 per pt .

## 2021-01-05 NOTE — Progress Notes (Signed)
DUE TO COVID-19 ONLY ONE VISITOR IS ALLOWED TO COME WITH YOU AND STAY IN THE WAITING ROOM ONLY DURING PRE OP AND PROCEDURE DAY OF SURGERY. THE 1 VISITOR  MAY VISIT WITH YOU AFTER SURGERY IN YOUR PRIVATE ROOM DURING VISITING HOURS ONLY!  YOU NEED TO HAVE A COVID 19 TEST ON__7/26/2022 _____ @__1000  am _____, THIS TEST MUST BE DONE BEFORE SURGERY,  COVID TESTING SITE [address] [place] , IT IS ON THE RIGHT GOING OUT [address] APPROXIMATELY  2 MINUTES PAST [employer] ON THE RIGHT. ONCE YOUR COVID TEST IS COMPLETED,  PLEASE BEGIN THE QUARANTINE INSTRUCTIONS AS OUTLINED IN YOUR HANDOUT.                ASHAUNA BERTHOLF  01/05/2021   Your procedure is scheduled on:           01/16/2021   Report to [facility] Main  Entrance   Report to admitting at  [phone]     Call this number if you have problems the morning of surgery [phone]    REMEMBER: NO  SOLID FOOD CANDY OR GUM AFTER MIDNIGHT. CLEAR LIQUIDS UNTIL    0430am        . NOTHING BY MOUTH EXCEPT CLEAR LIQUIDS UNTIL    0430am   . PLEASE FINISH ENSURE DRINK PER SURGEON ORDER  WHICH NEEDS TO BE COMPLETED AT   0430am    .      CLEAR LIQUID DIET   Foods Allowed                                                                    Coffee and tea, regular and decaf                            Fruit ices (not with fruit pulp)                                      Iced Popsicles                                    Carbonated beverages, regular and diet                                    Cranberry, grape and apple juices Sports drinks like Gatorade Lightly seasoned clear broth or consume(fat free) Sugar, honey syrup ___________________________________________________________________      BRUSH YOUR TEETH MORNING OF SURGERY AND RINSE YOUR MOUTH OUT, NO CHEWING GUM CANDY OR MINTS.     Take these medicines the morning of surgery with A SIP OF WATER:  topamax, protonix, gabapentin, clonidine   DO  NOT TAKE ANY DIABETIC MEDICATIONS DAY OF YOUR SURGERY                               You may not have any metal on your body including hair pins and  piercings  Do not wear jewelry, make-up, lotions, powders or perfumes, deodorant             Do not wear nail polish on your fingernails.  Do not shave  48 hours prior to surgery.              Men may shave face and neck.   Do not bring valuables to the hospital. [place].  Contacts, dentures or bridgework may not be worn into surgery.  Leave suitcase in the car. After surgery it may be brought to your room.     Patients discharged the day of surgery will not be allowed to drive home. IF YOU ARE HAVING SURGERY AND GOING HOME THE SAME DAY, YOU MUST HAVE AN ADULT TO DRIVE YOU HOME AND BE WITH YOU FOR 24 HOURS. YOU MAY GO HOME BY TAXI OR UBER OR ORTHERWISE, BUT AN ADULT MUST ACCOMPANY YOU HOME AND STAY WITH YOU FOR 24 HOURS.  Name and phone number of your driver:  Special Instructions: N/A              Please read over the following fact sheets you were given: _____________________________________________________________________  [facility] - Preparing for Surgery Before surgery, you can play an important role.  Because skin is not sterile, your skin needs to be as free of germs as possible.  You can reduce the number of germs on your skin by washing with CHG (chlorahexidine gluconate) soap before surgery.  CHG is an antiseptic cleaner which kills germs and bonds with the skin to continue killing germs even after washing. Please DO NOT use if you have an allergy to CHG or antibacterial soaps.  If your skin becomes reddened/irritated stop using the CHG and inform your nurse when you arrive at Short Stay. Do not shave (including legs and underarms) for at least 48 hours prior to the first CHG shower.  You may shave your face/neck. Please follow these instructions carefully:  1.  Shower  with CHG Soap the night before surgery and the  morning of Surgery.  2.  If you choose to wash your hair, wash your hair first as usual with your  normal  shampoo.  3.  After you shampoo, rinse your hair and body thoroughly to remove the  shampoo.                           4.  Use CHG as you would any other liquid soap.  You can apply chg directly  to the skin and wash                       Gently with a scrungie or clean washcloth.  5.  Apply the CHG Soap to your body ONLY FROM THE NECK DOWN.   Do not use on face/ open                           Wound or open sores. Avoid contact with eyes, ears mouth and genitals (private parts).                       Wash face,  Genitals (private parts) with your normal soap.             6.  Wash thoroughly, paying special attention to the area where your surgery  will be performed.  7.  Thoroughly rinse your body with warm water from the neck down.  8.  DO NOT shower/wash with your normal soap after using and rinsing off  the CHG Soap.                9.  Pat yourself dry with a clean towel.            10.  Wear clean pajamas.            11.  Place clean sheets on your bed the night of your first shower and do not  sleep with pets. Day of Surgery : Do not apply any lotions/deodorants the morning of surgery.  Please wear clean clothes to the hospital/surgery center.  FAILURE TO FOLLOW THESE INSTRUCTIONS MAY RESULT IN THE CANCELLATION OF YOUR SURGERY PATIENT SIGNATURE_________________________________  NURSE SIGNATURE__________________________________  ________________________________________________________________________

## 2021-01-07 DIAGNOSIS — G894 Chronic pain syndrome: Secondary | ICD-10-CM | POA: Diagnosis not present

## 2021-01-08 DIAGNOSIS — G894 Chronic pain syndrome: Secondary | ICD-10-CM | POA: Diagnosis not present

## 2021-01-09 DIAGNOSIS — G894 Chronic pain syndrome: Secondary | ICD-10-CM | POA: Diagnosis not present

## 2021-01-10 DIAGNOSIS — G894 Chronic pain syndrome: Secondary | ICD-10-CM | POA: Diagnosis not present

## 2021-01-11 DIAGNOSIS — G894 Chronic pain syndrome: Secondary | ICD-10-CM | POA: Diagnosis not present

## 2021-01-13 ENCOUNTER — Other Ambulatory Visit (HOSPITAL_COMMUNITY)
Admission: RE | Admit: 2021-01-13 | Discharge: 2021-01-13 | Disposition: A | Discharge status: Home / Self Care | Payer: Medicaid Other | Source: Ambulatory Visit | Attending: Orthopedic Surgery | Admitting: Orthopedic Surgery

## 2021-01-13 DIAGNOSIS — I10 Essential (primary) hypertension: Secondary | ICD-10-CM | POA: Diagnosis not present

## 2021-01-13 DIAGNOSIS — M5137 Other intervertebral disc degeneration, lumbosacral region: Secondary | ICD-10-CM | POA: Diagnosis not present

## 2021-01-13 DIAGNOSIS — M545 Low back pain, unspecified: Secondary | ICD-10-CM | POA: Diagnosis not present

## 2021-01-13 DIAGNOSIS — M25512 Pain in left shoulder: Secondary | ICD-10-CM | POA: Diagnosis not present

## 2021-01-13 DIAGNOSIS — Z01812 Encounter for preprocedural laboratory examination: Secondary | ICD-10-CM | POA: Insufficient documentation

## 2021-01-13 DIAGNOSIS — Z6831 Body mass index (BMI) 31.0-31.9, adult: Secondary | ICD-10-CM | POA: Diagnosis not present

## 2021-01-13 DIAGNOSIS — M25562 Pain in left knee: Secondary | ICD-10-CM | POA: Diagnosis not present

## 2021-01-13 DIAGNOSIS — M170 Bilateral primary osteoarthritis of knee: Secondary | ICD-10-CM | POA: Diagnosis not present

## 2021-01-13 DIAGNOSIS — Z6834 Body mass index (BMI) 34.0-34.9, adult: Secondary | ICD-10-CM | POA: Diagnosis not present

## 2021-01-13 DIAGNOSIS — M542 Cervicalgia: Secondary | ICD-10-CM | POA: Diagnosis not present

## 2021-01-13 DIAGNOSIS — Z20822 Contact with and (suspected) exposure to covid-19: Secondary | ICD-10-CM | POA: Insufficient documentation

## 2021-01-13 DIAGNOSIS — M179 Osteoarthritis of knee, unspecified: Secondary | ICD-10-CM | POA: Diagnosis not present

## 2021-01-13 DIAGNOSIS — G894 Chronic pain syndrome: Secondary | ICD-10-CM | POA: Diagnosis not present

## 2021-01-13 DIAGNOSIS — R7303 Prediabetes: Secondary | ICD-10-CM | POA: Diagnosis not present

## 2021-01-13 DIAGNOSIS — M25511 Pain in right shoulder: Secondary | ICD-10-CM | POA: Diagnosis not present

## 2021-01-13 DIAGNOSIS — M79609 Pain in unspecified limb: Secondary | ICD-10-CM | POA: Diagnosis not present

## 2021-01-13 DIAGNOSIS — G501 Atypical facial pain: Secondary | ICD-10-CM | POA: Diagnosis not present

## 2021-01-13 DIAGNOSIS — G43909 Migraine, unspecified, not intractable, without status migrainosus: Secondary | ICD-10-CM | POA: Diagnosis not present

## 2021-01-13 DIAGNOSIS — K219 Gastro-esophageal reflux disease without esophagitis: Secondary | ICD-10-CM | POA: Diagnosis not present

## 2021-01-13 LAB — SARS CORONAVIRUS 2 (TAT 6-24 HRS): SARS Coronavirus 2: NEGATIVE

## 2021-01-14 DIAGNOSIS — G894 Chronic pain syndrome: Secondary | ICD-10-CM | POA: Diagnosis not present

## 2021-01-15 DIAGNOSIS — G894 Chronic pain syndrome: Secondary | ICD-10-CM | POA: Diagnosis not present

## 2021-01-15 MED ORDER — BUPIVACAINE LIPOSOME 1.3 % IJ SUSP
20.0000 mL | Freq: Once | INTRAMUSCULAR | Status: DC
  Filled 2021-01-15: qty 20

## 2021-01-15 NOTE — Anesthesia Preprocedure Evaluation (Addendum)
Anesthesia Evaluation  Patient identified by MRN, date of birth, ID band Patient awake    Reviewed: Allergy & Precautions, NPO status , Patient's Chart, lab work & pertinent test results  History of Anesthesia Complications Negative for: history of anesthetic complications  Airway Mallampati: II  TM Distance: >3 FB Neck ROM: Full    Dental  (+) Missing,    Pulmonary neg pulmonary ROS,    Pulmonary exam normal        Cardiovascular hypertension, Pt. on medications Normal cardiovascular exam     Neuro/Psych  Headaches (chronic opioids), negative psych ROS   GI/Hepatic Neg liver ROS, GERD  Medicated and Controlled,  Endo/Other  negative endocrine ROS  Renal/GU negative Renal ROS  negative genitourinary   Musculoskeletal  (+) Arthritis ,   Abdominal   Peds  Hematology negative hematology ROS (+)   Anesthesia Other Findings Day of surgery medications reviewed with patient.  Reproductive/Obstetrics negative OB ROS                            Anesthesia Physical Anesthesia Plan  ASA: 2  Anesthesia Plan: Spinal   Post-op Pain Management:  Regional for Post-op pain   Induction:   PONV Risk Score and Plan: 3 and Treatment may vary due to age or medical condition, Ondansetron, Propofol infusion, Dexamethasone and Midazolam  Airway Management Planned: Natural Airway and Simple Face Mask  Additional Equipment: None  Intra-op Plan:   Post-operative Plan:   Informed Consent: I have reviewed the patients History and Physical, chart, labs and discussed the procedure including the risks, benefits and alternatives for the proposed anesthesia with the patient or authorized representative who has indicated his/her understanding and acceptance.     Dental advisory given  Plan Discussed with: CRNA  Anesthesia Plan Comments:        Anesthesia Quick Evaluation

## 2021-01-16 ENCOUNTER — Other Ambulatory Visit: Payer: Self-pay

## 2021-01-16 ENCOUNTER — Ambulatory Visit (HOSPITAL_COMMUNITY): Payer: Medicaid Other | Admitting: Physician Assistant

## 2021-01-16 ENCOUNTER — Inpatient Hospital Stay (HOSPITAL_COMMUNITY)
Admission: RE | Admit: 2021-01-16 | Discharge: 2021-01-21 | DRG: 470 | Disposition: A | Discharge status: Home / Self Care | Payer: Medicaid Other | Attending: Orthopedic Surgery | Admitting: Orthopedic Surgery

## 2021-01-16 ENCOUNTER — Encounter (HOSPITAL_COMMUNITY)
Admission: RE | Disposition: A | Discharge status: Home / Self Care | Payer: Self-pay | Source: Home / Self Care | Attending: Orthopedic Surgery

## 2021-01-16 ENCOUNTER — Ambulatory Visit (HOSPITAL_COMMUNITY): Payer: Medicaid Other | Admitting: Certified Registered"

## 2021-01-16 ENCOUNTER — Encounter (HOSPITAL_COMMUNITY): Payer: Self-pay | Admitting: Orthopedic Surgery

## 2021-01-16 DIAGNOSIS — G8929 Other chronic pain: Secondary | ICD-10-CM | POA: Diagnosis not present

## 2021-01-16 DIAGNOSIS — G894 Chronic pain syndrome: Secondary | ICD-10-CM | POA: Diagnosis not present

## 2021-01-16 DIAGNOSIS — Z886 Allergy status to analgesic agent status: Secondary | ICD-10-CM

## 2021-01-16 DIAGNOSIS — M25762 Osteophyte, left knee: Secondary | ICD-10-CM | POA: Diagnosis not present

## 2021-01-16 DIAGNOSIS — Z888 Allergy status to other drugs, medicaments and biological substances status: Secondary | ICD-10-CM

## 2021-01-16 DIAGNOSIS — Z91018 Allergy to other foods: Secondary | ICD-10-CM

## 2021-01-16 DIAGNOSIS — Z79899 Other long term (current) drug therapy: Secondary | ICD-10-CM | POA: Diagnosis not present

## 2021-01-16 DIAGNOSIS — M545 Low back pain, unspecified: Secondary | ICD-10-CM | POA: Diagnosis present

## 2021-01-16 DIAGNOSIS — I10 Essential (primary) hypertension: Secondary | ICD-10-CM | POA: Diagnosis present

## 2021-01-16 DIAGNOSIS — M1712 Unilateral primary osteoarthritis, left knee: Principal | ICD-10-CM | POA: Diagnosis present

## 2021-01-16 DIAGNOSIS — I429 Cardiomyopathy, unspecified: Secondary | ICD-10-CM | POA: Diagnosis not present

## 2021-01-16 DIAGNOSIS — Z96652 Presence of left artificial knee joint: Secondary | ICD-10-CM

## 2021-01-16 DIAGNOSIS — G8918 Other acute postprocedural pain: Secondary | ICD-10-CM | POA: Diagnosis not present

## 2021-01-16 DIAGNOSIS — Z87442 Personal history of urinary calculi: Secondary | ICD-10-CM

## 2021-01-16 DIAGNOSIS — Z87892 Personal history of anaphylaxis: Secondary | ICD-10-CM

## 2021-01-16 HISTORY — DX: Personal history of urinary calculi: Z87.442

## 2021-01-16 HISTORY — DX: Unspecified osteoarthritis, unspecified site: M19.90

## 2021-01-16 HISTORY — PX: TOTAL KNEE ARTHROPLASTY: SHX125

## 2021-01-16 LAB — PREGNANCY, URINE: Preg Test, Ur: NEGATIVE

## 2021-01-16 SURGERY — ARTHROPLASTY, KNEE, TOTAL
Anesthesia: Spinal | Site: Knee | Laterality: Left

## 2021-01-16 MED ORDER — HYDROMORPHONE HCL 2 MG PO TABS
2.0000 mg | ORAL_TABLET | ORAL | Status: DC | PRN
  Administered 2021-01-16 – 2021-01-17 (×2): 2 mg via ORAL
  Filled 2021-01-16 (×2): qty 1

## 2021-01-16 MED ORDER — SODIUM CHLORIDE 0.9 % IR SOLN
Status: DC | PRN
  Administered 2021-01-16: 1000 mL

## 2021-01-16 MED ORDER — PROPOFOL 10 MG/ML IV BOLUS
INTRAVENOUS | Status: DC | PRN
  Administered 2021-01-16 (×2): 20 mg via INTRAVENOUS
  Administered 2021-01-16: 50 mg via INTRAVENOUS
  Administered 2021-01-16: 10 mg via INTRAVENOUS
  Administered 2021-01-16: 20 mg via INTRAVENOUS

## 2021-01-16 MED ORDER — PANTOPRAZOLE SODIUM 40 MG PO TBEC
40.0000 mg | DELAYED_RELEASE_TABLET | Freq: Every morning | ORAL | Status: DC
  Administered 2021-01-17 – 2021-01-21 (×6): 40 mg via ORAL
  Filled 2021-01-16 (×7): qty 1

## 2021-01-16 MED ORDER — PHENDIMETRAZINE TARTRATE ER 105 MG PO CP24: 105.0000 mg | ORAL_CAPSULE | Freq: Every morning | ORAL | Status: DC

## 2021-01-16 MED ORDER — SODIUM CHLORIDE 0.9 % IV SOLN: INTRAVENOUS | Status: DC

## 2021-01-16 MED ORDER — HYDROMORPHONE HCL 1 MG/ML IJ SOLN
0.5000 mg | INTRAMUSCULAR | Status: DC | PRN
  Administered 2021-01-16: 1 mg via INTRAVENOUS
  Filled 2021-01-16 (×3): qty 1

## 2021-01-16 MED ORDER — FENTANYL CITRATE (PF) 100 MCG/2ML IJ SOLN
25.0000 ug | INTRAMUSCULAR | Status: DC | PRN
  Administered 2021-01-16 (×3): 50 ug via INTRAVENOUS

## 2021-01-16 MED ORDER — METHOCARBAMOL 500 MG PO TABS
500.0000 mg | ORAL_TABLET | Freq: Four times a day (QID) | ORAL | Status: DC | PRN
  Administered 2021-01-16 – 2021-01-21 (×11): 500 mg via ORAL
  Filled 2021-01-16 (×11): qty 1

## 2021-01-16 MED ORDER — OXYCODONE HCL 5 MG PO TABS
ORAL_TABLET | ORAL | Status: AC
  Filled 2021-01-16: qty 3

## 2021-01-16 MED ORDER — MENTHOL 3 MG MT LOZG
1.0000 | LOZENGE | OROMUCOSAL | Status: DC | PRN
  Administered 2021-01-20: 3 mg via ORAL
  Filled 2021-01-16: qty 9

## 2021-01-16 MED ORDER — CHLORHEXIDINE GLUCONATE 0.12 % MT SOLN
15.0000 mL | Freq: Once | OROMUCOSAL | Status: AC
  Administered 2021-01-16: 15 mL via OROMUCOSAL

## 2021-01-16 MED ORDER — OXYCODONE HCL 5 MG PO TABS
15.0000 mg | ORAL_TABLET | Freq: Two times a day (BID) | ORAL | Status: DC
Start: 2021-01-16 — End: 2021-01-21
  Administered 2021-01-16 – 2021-01-21 (×10): 15 mg via ORAL
  Filled 2021-01-16 (×10): qty 3

## 2021-01-16 MED ORDER — FENTANYL CITRATE (PF) 100 MCG/2ML IJ SOLN
INTRAMUSCULAR | Status: AC
  Filled 2021-01-16: qty 4

## 2021-01-16 MED ORDER — DEXAMETHASONE SODIUM PHOSPHATE 10 MG/ML IJ SOLN
INTRAMUSCULAR | Status: AC
  Filled 2021-01-16: qty 1

## 2021-01-16 MED ORDER — TRANEXAMIC ACID-NACL 1000-0.7 MG/100ML-% IV SOLN
1000.0000 mg | Freq: Once | INTRAVENOUS | Status: AC
  Administered 2021-01-16: 1000 mg via INTRAVENOUS
  Filled 2021-01-16: qty 100

## 2021-01-16 MED ORDER — LIDOCAINE 2% (20 MG/ML) 5 ML SYRINGE
INTRAMUSCULAR | Status: DC | PRN
Start: 2021-01-16 — End: 2021-01-16
  Administered 2021-01-16: 60 mg via INTRAVENOUS

## 2021-01-16 MED ORDER — GABAPENTIN 300 MG PO CAPS
600.0000 mg | ORAL_CAPSULE | Freq: Two times a day (BID) | ORAL | Status: DC
  Administered 2021-01-16 – 2021-01-21 (×10): 600 mg via ORAL
  Filled 2021-01-16 (×10): qty 2

## 2021-01-16 MED ORDER — WATER FOR IRRIGATION, STERILE IR SOLN
Status: DC | PRN
  Administered 2021-01-16: 2000 mL

## 2021-01-16 MED ORDER — POVIDONE-IODINE 10 % EX SWAB
2.0000 "application " | Freq: Once | CUTANEOUS | Status: AC
  Administered 2021-01-16: 2 via TOPICAL

## 2021-01-16 MED ORDER — DEXAMETHASONE SODIUM PHOSPHATE 10 MG/ML IJ SOLN
INTRAMUSCULAR | Status: DC | PRN
  Administered 2021-01-16: 10 mg via INTRAVENOUS

## 2021-01-16 MED ORDER — METHOCARBAMOL 500 MG IVPB - SIMPLE MED
INTRAVENOUS | Status: AC
  Filled 2021-01-16: qty 50

## 2021-01-16 MED ORDER — ONDANSETRON HCL 4 MG/2ML IJ SOLN
INTRAMUSCULAR | Status: DC | PRN
  Administered 2021-01-16: 4 mg via INTRAVENOUS

## 2021-01-16 MED ORDER — HYDROCHLOROTHIAZIDE 25 MG PO TABS
25.0000 mg | ORAL_TABLET | Freq: Every day | ORAL | Status: DC
  Administered 2021-01-16 – 2021-01-21 (×4): 25 mg via ORAL
  Filled 2021-01-16 (×4): qty 1

## 2021-01-16 MED ORDER — SODIUM CHLORIDE 0.9 % IV SOLN
2.0000 g | INTRAVENOUS | Status: AC
  Administered 2021-01-16: 2 g via INTRAVENOUS
  Filled 2021-01-16: qty 2

## 2021-01-16 MED ORDER — PHENYLEPHRINE HCL-NACL 10-0.9 MG/250ML-% IV SOLN
INTRAVENOUS | Status: DC | PRN
  Administered 2021-01-16: 50 ug/min via INTRAVENOUS

## 2021-01-16 MED ORDER — OXYCODONE HCL 5 MG/5ML PO SOLN: 5.0000 mg | Freq: Once | ORAL | Status: DC | PRN

## 2021-01-16 MED ORDER — HYDROMORPHONE HCL 1 MG/ML IJ SOLN
0.2500 mg | INTRAMUSCULAR | Status: DC | PRN
  Administered 2021-01-16 (×4): 0.5 mg via INTRAVENOUS

## 2021-01-16 MED ORDER — SUMATRIPTAN SUCCINATE 50 MG PO TABS
100.0000 mg | ORAL_TABLET | ORAL | Status: DC | PRN
  Administered 2021-01-16: 100 mg via ORAL
  Filled 2021-01-16 (×4): qty 2

## 2021-01-16 MED ORDER — TRAZODONE HCL 50 MG PO TABS
50.0000 mg | ORAL_TABLET | Freq: Every day | ORAL | Status: DC
  Administered 2021-01-16 – 2021-01-20 (×5): 50 mg via ORAL
  Filled 2021-01-16 (×5): qty 1

## 2021-01-16 MED ORDER — PROMETHAZINE HCL 25 MG RE SUPP
25.0000 mg | Freq: Three times a day (TID) | RECTAL | Status: DC | PRN
  Filled 2021-01-16: qty 1

## 2021-01-16 MED ORDER — GLYCOPYRROLATE PF 0.2 MG/ML IJ SOSY
PREFILLED_SYRINGE | INTRAMUSCULAR | Status: DC | PRN
  Administered 2021-01-16: .2 mg via INTRAVENOUS

## 2021-01-16 MED ORDER — AMITRIPTYLINE HCL 25 MG PO TABS
25.0000 mg | ORAL_TABLET | Freq: Every evening | ORAL | Status: DC | PRN
  Administered 2021-01-17: 25 mg via ORAL
  Filled 2021-01-16 (×3): qty 1

## 2021-01-16 MED ORDER — METHOCARBAMOL 500 MG IVPB - SIMPLE MED
500.0000 mg | Freq: Four times a day (QID) | INTRAVENOUS | Status: DC | PRN
  Administered 2021-01-16: 500 mg via INTRAVENOUS
  Filled 2021-01-16: qty 50

## 2021-01-16 MED ORDER — METHOCARBAMOL 500 MG PO TABS: 500.0000 mg | ORAL_TABLET | Freq: Four times a day (QID) | ORAL | 1 refills | Status: DC | PRN

## 2021-01-16 MED ORDER — POLYETHYLENE GLYCOL 3350 17 G PO PACK: 17.0000 g | PACK | Freq: Every day | ORAL | Status: DC | PRN

## 2021-01-16 MED ORDER — ONDANSETRON HCL 4 MG PO TABS
4.0000 mg | ORAL_TABLET | Freq: Three times a day (TID) | ORAL | Status: DC | PRN
  Administered 2021-01-16 – 2021-01-19 (×4): 4 mg via ORAL
  Filled 2021-01-16 (×4): qty 1

## 2021-01-16 MED ORDER — ONDANSETRON HCL 4 MG PO TABS: 4.0000 mg | ORAL_TABLET | Freq: Four times a day (QID) | ORAL | Status: DC | PRN

## 2021-01-16 MED ORDER — PROMETHAZINE HCL 25 MG PO TABS
25.0000 mg | ORAL_TABLET | Freq: Four times a day (QID) | ORAL | Status: DC | PRN
  Administered 2021-01-20: 25 mg via ORAL
  Filled 2021-01-16: qty 1

## 2021-01-16 MED ORDER — MIDAZOLAM HCL 2 MG/2ML IJ SOLN
INTRAMUSCULAR | Status: AC
  Filled 2021-01-16: qty 2

## 2021-01-16 MED ORDER — ONDANSETRON HCL 4 MG/2ML IJ SOLN: 4.0000 mg | Freq: Four times a day (QID) | INTRAMUSCULAR | Status: DC | PRN

## 2021-01-16 MED ORDER — CLONIDINE HCL 0.1 MG PO TABS
0.1000 mg | ORAL_TABLET | Freq: Two times a day (BID) | ORAL | Status: DC
  Administered 2021-01-16 – 2021-01-21 (×9): 0.1 mg via ORAL
  Filled 2021-01-16 (×9): qty 1

## 2021-01-16 MED ORDER — GLYCOPYRROLATE PF 0.2 MG/ML IJ SOSY
PREFILLED_SYRINGE | INTRAMUSCULAR | Status: AC
  Filled 2021-01-16: qty 1

## 2021-01-16 MED ORDER — ONDANSETRON HCL 4 MG PO TABS: 4.0000 mg | ORAL_TABLET | Freq: Every day | ORAL | 1 refills | Status: DC | PRN

## 2021-01-16 MED ORDER — METOCLOPRAMIDE HCL 5 MG PO TABS
5.0000 mg | ORAL_TABLET | Freq: Three times a day (TID) | ORAL | Status: DC | PRN
  Administered 2021-01-19: 10 mg via ORAL
  Filled 2021-01-16: qty 2

## 2021-01-16 MED ORDER — LACTATED RINGERS IV SOLN: INTRAVENOUS | Status: DC

## 2021-01-16 MED ORDER — 0.9 % SODIUM CHLORIDE (POUR BTL) OPTIME
TOPICAL | Status: DC | PRN
  Administered 2021-01-16: 1000 mL

## 2021-01-16 MED ORDER — OXYCODONE HCL 5 MG PO TABS
ORAL_TABLET | ORAL | Status: AC
  Administered 2021-01-16: 15 mg via ORAL
  Filled 2021-01-16: qty 1

## 2021-01-16 MED ORDER — PHENOL 1.4 % MT LIQD: 1.0000 | OROMUCOSAL | Status: DC | PRN

## 2021-01-16 MED ORDER — ACETAMINOPHEN 500 MG PO TABS
1000.0000 mg | ORAL_TABLET | Freq: Once | ORAL | Status: AC
  Administered 2021-01-16: 1000 mg via ORAL
  Filled 2021-01-16: qty 2

## 2021-01-16 MED ORDER — APIXABAN 2.5 MG PO TABS
2.5000 mg | ORAL_TABLET | Freq: Two times a day (BID) | ORAL | Status: DC
  Administered 2021-01-16 – 2021-01-21 (×10): 2.5 mg via ORAL
  Filled 2021-01-16 (×10): qty 1

## 2021-01-16 MED ORDER — METOCLOPRAMIDE HCL 5 MG/ML IJ SOLN
5.0000 mg | Freq: Three times a day (TID) | INTRAMUSCULAR | Status: DC | PRN
  Filled 2021-01-16: qty 2

## 2021-01-16 MED ORDER — PROMETHAZINE HCL 25 MG/ML IJ SOLN
6.2500 mg | INTRAMUSCULAR | Status: DC | PRN
Start: 2021-01-16 — End: 2021-01-16

## 2021-01-16 MED ORDER — FLUTICASONE PROPIONATE 50 MCG/ACT NA SUSP
1.0000 | Freq: Every day | NASAL | Status: DC | PRN
  Filled 2021-01-16: qty 16

## 2021-01-16 MED ORDER — SODIUM CHLORIDE (PF) 0.9 % IJ SOLN
INTRAMUSCULAR | Status: AC
  Filled 2021-01-16: qty 30

## 2021-01-16 MED ORDER — FENTANYL CITRATE (PF) 100 MCG/2ML IJ SOLN
INTRAMUSCULAR | Status: DC | PRN
  Administered 2021-01-16 (×2): 50 ug via INTRAVENOUS

## 2021-01-16 MED ORDER — LORATADINE 10 MG PO TABS
10.0000 mg | ORAL_TABLET | Freq: Every day | ORAL | Status: DC
  Administered 2021-01-16 – 2021-01-21 (×6): 10 mg via ORAL
  Filled 2021-01-16 (×6): qty 1

## 2021-01-16 MED ORDER — TRANEXAMIC ACID-NACL 1000-0.7 MG/100ML-% IV SOLN
1000.0000 mg | INTRAVENOUS | Status: AC
  Administered 2021-01-16: 1000 mg via INTRAVENOUS
  Filled 2021-01-16: qty 100

## 2021-01-16 MED ORDER — DOCUSATE SODIUM 100 MG PO CAPS
100.0000 mg | ORAL_CAPSULE | Freq: Two times a day (BID) | ORAL | Status: DC
  Administered 2021-01-16 – 2021-01-21 (×11): 100 mg via ORAL
  Filled 2021-01-16 (×11): qty 1

## 2021-01-16 MED ORDER — BUPIVACAINE-EPINEPHRINE 0.25% -1:200000 IJ SOLN
INTRAMUSCULAR | Status: DC | PRN
  Administered 2021-01-16: 30 mL

## 2021-01-16 MED ORDER — BISACODYL 10 MG RE SUPP: 10.0000 mg | Freq: Every day | RECTAL | Status: DC | PRN

## 2021-01-16 MED ORDER — BUTALBITAL-APAP-CAFFEINE 50-325-40 MG PO CAPS: 1.0000 | ORAL_CAPSULE | Freq: Four times a day (QID) | ORAL | Status: DC | PRN

## 2021-01-16 MED ORDER — DICLOFENAC SODIUM 3 % EX GEL: 1.0000 "application " | Freq: Two times a day (BID) | CUTANEOUS | Status: DC | PRN

## 2021-01-16 MED ORDER — GABAPENTIN 300 MG PO CAPS
ORAL_CAPSULE | ORAL | Status: AC
  Administered 2021-01-16: 300 mg via ORAL
  Filled 2021-01-16: qty 1

## 2021-01-16 MED ORDER — FENTANYL CITRATE (PF) 100 MCG/2ML IJ SOLN
INTRAMUSCULAR | Status: AC
  Filled 2021-01-16: qty 2

## 2021-01-16 MED ORDER — BUPIVACAINE LIPOSOME 1.3 % IJ SUSP
INTRAMUSCULAR | Status: DC | PRN
  Administered 2021-01-16: 20 mL

## 2021-01-16 MED ORDER — BUTALBITAL-APAP-CAFFEINE 50-325-40 MG PO TABS
1.0000 | ORAL_TABLET | Freq: Four times a day (QID) | ORAL | Status: DC | PRN
  Administered 2021-01-16 – 2021-01-17 (×2): 1 via ORAL
  Filled 2021-01-16 (×2): qty 1

## 2021-01-16 MED ORDER — VITAMIN D (ERGOCALCIFEROL) 1.25 MG (50000 UNIT) PO CAPS
50000.0000 [IU] | ORAL_CAPSULE | ORAL | Status: DC
  Administered 2021-01-19: 50000 [IU] via ORAL
  Filled 2021-01-16: qty 1

## 2021-01-16 MED ORDER — SODIUM CHLORIDE 0.9 % IV SOLN
2.0000 g | Freq: Four times a day (QID) | INTRAVENOUS | Status: AC
  Administered 2021-01-16 (×2): 2 g via INTRAVENOUS
  Filled 2021-01-16 (×2): qty 2

## 2021-01-16 MED ORDER — ONDANSETRON HCL 4 MG/2ML IJ SOLN
INTRAMUSCULAR | Status: AC
  Filled 2021-01-16: qty 2

## 2021-01-16 MED ORDER — CLONIDINE HCL (ANALGESIA) 100 MCG/ML EP SOLN
EPIDURAL | Status: DC | PRN
  Administered 2021-01-16: 100 ug

## 2021-01-16 MED ORDER — TOPIRAMATE 25 MG PO TABS
150.0000 mg | ORAL_TABLET | Freq: Two times a day (BID) | ORAL | Status: DC
  Administered 2021-01-16 – 2021-01-21 (×11): 150 mg via ORAL
  Filled 2021-01-16 (×11): qty 2

## 2021-01-16 MED ORDER — BUPIVACAINE IN DEXTROSE 0.75-8.25 % IT SOLN
INTRATHECAL | Status: DC | PRN
  Administered 2021-01-16: 1.6 mL via INTRATHECAL

## 2021-01-16 MED ORDER — GABAPENTIN 300 MG PO CAPS: 300.0000 mg | ORAL_CAPSULE | Freq: Two times a day (BID) | ORAL | Status: DC

## 2021-01-16 MED ORDER — MIDAZOLAM HCL 2 MG/2ML IJ SOLN
INTRAMUSCULAR | Status: DC | PRN
  Administered 2021-01-16: 2 mg via INTRAVENOUS

## 2021-01-16 MED ORDER — PHENYLEPHRINE HCL (PRESSORS) 10 MG/ML IV SOLN
INTRAVENOUS | Status: AC
  Filled 2021-01-16: qty 1

## 2021-01-16 MED ORDER — HYDROMORPHONE HCL 2 MG PO TABS: 2.0000 mg | ORAL_TABLET | ORAL | 0 refills | Status: DC | PRN

## 2021-01-16 MED ORDER — SODIUM CHLORIDE (PF) 0.9 % IJ SOLN
INTRAMUSCULAR | Status: DC | PRN
  Administered 2021-01-16: 30 mL

## 2021-01-16 MED ORDER — BUPIVACAINE-EPINEPHRINE (PF) 0.25% -1:200000 IJ SOLN
INTRAMUSCULAR | Status: AC
  Filled 2021-01-16: qty 30

## 2021-01-16 MED ORDER — BUPIVACAINE-EPINEPHRINE (PF) 0.5% -1:200000 IJ SOLN
INTRAMUSCULAR | Status: DC | PRN
  Administered 2021-01-16: 15 mL via PERINEURAL

## 2021-01-16 MED ORDER — HYDROMORPHONE HCL 1 MG/ML IJ SOLN
INTRAMUSCULAR | Status: AC
  Filled 2021-01-16: qty 2

## 2021-01-16 MED ORDER — EPINEPHRINE 0.3 MG/0.3ML IJ SOAJ
0.3000 mg | INTRAMUSCULAR | Status: DC | PRN
  Filled 2021-01-16: qty 0.6

## 2021-01-16 MED ORDER — ORAL CARE MOUTH RINSE: 15.0000 mL | Freq: Once | OROMUCOSAL | Status: AC

## 2021-01-16 MED ORDER — POLYETHYLENE GLYCOL 3350 17 G PO PACK
17.0000 g | PACK | Freq: Every day | ORAL | Status: DC
  Administered 2021-01-16 – 2021-01-21 (×6): 17 g via ORAL
  Filled 2021-01-16 (×6): qty 1

## 2021-01-16 MED ORDER — PROPOFOL 500 MG/50ML IV EMUL
INTRAVENOUS | Status: DC | PRN
  Administered 2021-01-16: 80 ug/kg/min via INTRAVENOUS

## 2021-01-16 MED ORDER — POTASSIUM CHLORIDE ER 10 MEQ PO TBCR
20.0000 meq | EXTENDED_RELEASE_TABLET | Freq: Every day | ORAL | Status: DC
  Administered 2021-01-16 – 2021-01-21 (×6): 20 meq via ORAL
  Filled 2021-01-16 (×12): qty 2

## 2021-01-16 MED ORDER — OXYCODONE HCL 5 MG PO TABS
5.0000 mg | ORAL_TABLET | Freq: Once | ORAL | Status: DC | PRN
Start: 2021-01-16 — End: 2021-01-16

## 2021-01-16 SURGICAL SUPPLY — 55 items
ATTUNE MED DOME PAT 38 KNEE (Knees) ×2 IMPLANT
ATTUNE PSFEM LTSZ6 NARCEM KNEE (Femur) ×2 IMPLANT
ATTUNE PSRP INSR SZ6 10 KNEE (Insert) ×2 IMPLANT
BAG COUNTER SPONGE SURGICOUNT (BAG) IMPLANT
BAG SPNG CNTER NS LX DISP (BAG)
BAG ZIPLOCK 12X15 (MISCELLANEOUS) IMPLANT
BASE TIBIAL ROT PLAT SZ 5 KNEE (Knees) ×1 IMPLANT
BLADE SAG 18X100X1.27 (BLADE) ×2 IMPLANT
BLADE SAW SGTL 13X75X1.27 (BLADE) ×2 IMPLANT
BNDG CMPR MED 10X6 ELC LF (GAUZE/BANDAGES/DRESSINGS) ×1
BNDG ELASTIC 6X10 VLCR STRL LF (GAUZE/BANDAGES/DRESSINGS) ×2 IMPLANT
BNDG GAUZE ELAST 4 BULKY (GAUZE/BANDAGES/DRESSINGS) ×2 IMPLANT
BOWL SMART MIX CTS (DISPOSABLE) ×2 IMPLANT
CEMENT HV SMART SET (Cement) ×4 IMPLANT
COVER SURGICAL LIGHT HANDLE (MISCELLANEOUS) ×2 IMPLANT
CUFF TOURN SGL QUICK 34 (TOURNIQUET CUFF) ×2
CUFF TRNQT CYL 34X4.125X (TOURNIQUET CUFF) ×1 IMPLANT
DRAPE SHEET LG 3/4 BI-LAMINATE (DRAPES) ×2 IMPLANT
DRAPE U-SHAPE 47X51 STRL (DRAPES) ×2 IMPLANT
DRSG ADAPTIC 3X8 NADH LF (GAUZE/BANDAGES/DRESSINGS) ×2 IMPLANT
DRSG PAD ABDOMINAL 8X10 ST (GAUZE/BANDAGES/DRESSINGS) ×2 IMPLANT
DURAPREP 26ML APPLICATOR (WOUND CARE) ×2 IMPLANT
ELECT REM PT RETURN 15FT ADLT (MISCELLANEOUS) ×2 IMPLANT
GAUZE SPONGE 4X4 12PLY STRL (GAUZE/BANDAGES/DRESSINGS) ×2 IMPLANT
GLOVE SURG ORTHO LTX SZ7.5 (GLOVE) ×2 IMPLANT
GLOVE SURG ORTHO LTX SZ8.5 (GLOVE) ×2 IMPLANT
GLOVE SURG UNDER POLY LF SZ7.5 (GLOVE) ×2 IMPLANT
GLOVE SURG UNDER POLY LF SZ8.5 (GLOVE) ×2 IMPLANT
GOWN STRL REUS W/TWL XL LVL3 (GOWN DISPOSABLE) ×4 IMPLANT
HANDPIECE INTERPULSE COAX TIP (DISPOSABLE) ×2
HOLDER FOLEY CATH W/STRAP (MISCELLANEOUS) IMPLANT
IMMOBILIZER KNEE 20 (SOFTGOODS)
IMMOBILIZER KNEE 20 THIGH 36 (SOFTGOODS) IMPLANT
IMMOBILIZER KNEE 22 UNIV (SOFTGOODS) ×2 IMPLANT
KIT TURNOVER KIT A (KITS) ×2 IMPLANT
MANIFOLD NEPTUNE II (INSTRUMENTS) ×2 IMPLANT
NS IRRIG 1000ML POUR BTL (IV SOLUTION) ×2 IMPLANT
PACK TOTAL KNEE CUSTOM (KITS) ×2 IMPLANT
PENCIL SMOKE EVACUATOR (MISCELLANEOUS) IMPLANT
PIN DRILL FIX HALF THREAD (BIT) ×2 IMPLANT
PIN STEINMAN FIXATION KNEE (PIN) ×2 IMPLANT
PROTECTOR NERVE ULNAR (MISCELLANEOUS) ×2 IMPLANT
SET HNDPC FAN SPRY TIP SCT (DISPOSABLE) ×1 IMPLANT
STAPLER VISISTAT 35W (STAPLE) IMPLANT
STRIP CLOSURE SKIN 1/2X4 (GAUZE/BANDAGES/DRESSINGS) ×4 IMPLANT
SUT MNCRL AB 3-0 PS2 18 (SUTURE) ×2 IMPLANT
SUT VIC AB 0 CT1 36 (SUTURE) ×2 IMPLANT
SUT VIC AB 1 CT1 36 (SUTURE) ×6 IMPLANT
SUT VIC AB 2-0 CT1 27 (SUTURE) ×2
SUT VIC AB 2-0 CT1 TAPERPNT 27 (SUTURE) ×1 IMPLANT
TIBIAL BASE ROT PLAT SZ 5 KNEE (Knees) ×2 IMPLANT
TRAY FOLEY MTR SLVR 14FR STAT (SET/KITS/TRAYS/PACK) ×2 IMPLANT
TRAY FOLEY MTR SLVR 16FR STAT (SET/KITS/TRAYS/PACK) IMPLANT
WATER STERILE IRR 1000ML POUR (IV SOLUTION) ×4 IMPLANT
YANKAUER SUCT BULB TIP NO VENT (SUCTIONS) ×2 IMPLANT

## 2021-01-16 NOTE — Discharge Instructions (Addendum)
Ice to the knee constantly.  Keep the incision covered and clean and dry for one week, then ok to get it wet in the shower.  Do exercise as instructed every hour, please to prevent stiffness.    DO NOT prop anything under the knee, it will make your knee stiff.  Prop under the ankle to encourage your knee to go straight.   Use the walker while you are up and around for balance.  Wear your support stockings 24/7 to prevent blood clots and take blood thinner for 30 days also to prevent blood clots  Follow up with Dr Ranell Patrick in two weeks in the office, call [phone] for appt total knee arthroplasty total knee arthroplasty    INSTRUCTIONS AFTER JOINT REPLACEMENT   Remove items at home which could result in a fall. This includes throw rugs or furniture in walking pathways ICE to the affected joint every three hours while awake for 30 minutes at a time, for at least the first 3-5 days, and then as needed for pain and swelling.  Continue to use ice for pain and swelling. You may notice swelling that will progress down to the foot and ankle.  This is normal after surgery.  Elevate your leg when you are not up walking on it.   Continue to use the breathing machine you got in the hospital (incentive spirometer) which will help keep your temperature down.  It is common for your temperature to cycle up and down following surgery, especially at night when you are not up moving around and exerting yourself.  The breathing machine keeps your lungs expanded and your temperature down.   DIET:  As you were doing prior to hospitalization, we recommend a well-balanced diet.  DRESSING / WOUND CARE / SHOWERING  You may change your dressing 3-5 days after surgery.  Then change the dressing every day with sterile gauze.  Please use good hand washing techniques before changing the dressing.  Do not use any lotions or creams on the incision until instructed by your surgeon.Ok to get it wet in the shower after one  week  ACTIVITY  Increase activity slowly as tolerated, but follow the weight bearing instructions below.   No driving for 6 weeks or until further direction given by your physician.  You cannot drive while taking narcotics.  No lifting or carrying greater than 10 lbs. until further directed by your surgeon. Avoid periods of inactivity such as sitting longer than an hour when not asleep. This helps prevent blood clots.  You may return to work once you are authorized by your doctor.     WEIGHT BEARING   Weight bearing as tolerated with assist device (walker, cane, etc) as directed, use it as long as suggested by your surgeon or therapist, typically at least 4-6 weeks.   EXERCISES  Results after joint replacement surgery are often greatly improved when you follow the exercise, range of motion and muscle strengthening exercises prescribed by your doctor. Safety measures are also important to protect the joint from further injury. Any time any of these exercises cause you to have increased pain or swelling, decrease what you are doing until you are comfortable again and then slowly increase them. If you have problems or questions, call your caregiver or physical therapist for advice.   Rehabilitation is important following a joint replacement. After just a few days of immobilization, the muscles of the leg can become weakened and shrink (atrophy).  These exercises are designed  to build up the tone and strength of the thigh and leg muscles and to improve motion. Often times heat used for twenty to thirty minutes before working out will loosen up your tissues and help with improving the range of motion but do not use heat for the first two weeks following surgery (sometimes heat can increase post-operative swelling).   These exercises can be done on a training (exercise) mat, on the floor, on a table or on a bed. Use whatever works the best and is most comfortable for you.    Use music or television  while you are exercising so that the exercises are a pleasant break in your day. This will make your life better with the exercises acting as a break in your routine that you can look forward to.   Perform all exercises about fifteen times, three times per day or as directed.  You should exercise both the operative leg and the other leg as well.  Exercises include:   Quad Sets - Tighten up the muscle on the front of the thigh (Quad) and hold for 5-10 seconds.   Straight Leg Raises - With your knee straight (if you were given a brace, keep it on), lift the leg to 60 degrees, hold for 3 seconds, and slowly lower the leg.  Perform this exercise against resistance later as your leg gets stronger.  Leg Slides: Lying on your back, slowly slide your foot toward your buttocks, bending your knee up off the floor (only go as far as is comfortable). Then slowly slide your foot back down until your leg is flat on the floor again.  Angel Wings: Lying on your back spread your legs to the side as far apart as you can without causing discomfort.  Hamstring Strength:  Lying on your back, push your heel against the floor with your leg straight by tightening up the muscles of your buttocks.  Repeat, but this time bend your knee to a comfortable angle, and push your heel against the floor.  You may put a pillow under the heel to make it more comfortable if necessary.   A rehabilitation program following joint replacement surgery can speed recovery and prevent re-injury in the future due to weakened muscles. Contact your doctor or a physical therapist for more information on knee rehabilitation.    CONSTIPATION  Constipation is defined medically as fewer than three stools per week and severe constipation as less than one stool per week.  Even if you have a regular bowel pattern at home, your normal regimen is likely to be disrupted due to multiple reasons following surgery.  Combination of anesthesia, postoperative  narcotics, change in appetite and fluid intake all can affect your bowels.   YOU MUST use at least one of the following options; they are listed in order of increasing strength to get the job done.  They are all available over the counter, and you may need to use some, POSSIBLY even all of these options:    Drink plenty of fluids (prune juice may be helpful) and high fiber foods Colace 100 mg by mouth twice a day  Senokot for constipation as directed and as needed Dulcolax (bisacodyl), take with full glass of water  Miralax (polyethylene glycol) once or twice a day as needed.  If you have tried all these things and are unable to have a bowel movement in the first 3-4 days after surgery call either your surgeon or your primary doctor.  If you experience loose stools or diarrhea, hold the medications until you stool forms back up.  If your symptoms do not get better within 1 week or if they get worse, check with your doctor.  If you experience "the worst abdominal pain ever" or develop nausea or vomiting, please contact the office immediately for further recommendations for treatment.   ITCHING:  If you experience itching with your medications, try taking only a single pain pill, or even half a pain pill at a time.  You can also use Benadryl over the counter for itching or also to help with sleep.   TED HOSE STOCKINGS:  Use stockings on both legs until for at least 2 weeks or as directed by physician office. They may be removed at night for sleeping.  MEDICATIONS:  See your medication summary on the "After Visit Summary" that nursing will review with you.  You may have some home medications which will be placed on hold until you complete the course of blood thinner medication.  It is important for you to complete the blood thinner medication as prescribed.  PRECAUTIONS:  If you experience chest pain or shortness of breath - call 911 immediately for transfer to the hospital emergency department.    If you develop a fever greater that 101 F, purulent drainage from wound, increased redness or drainage from wound, foul odor from the wound/dressing, or calf pain - CONTACT YOUR SURGEON.                                                   FOLLOW-UP APPOINTMENTS:  If you do not already have a post-op appointment, please call the office for an appointment to be seen by your surgeon.  Guidelines for how soon to be seen are listed in your "After Visit Summary", but are typically between 1-4 weeks after surgery.  OTHER INSTRUCTIONS:   Knee Replacement:  Do not place pillow under knee, focus on keeping the knee straight while resting. CPM instructions: 0-90 degrees, 2 hours in the morning, 2 hours in the afternoon, and 2 hours in the evening. Place foam block, curve side up under heel at all times except when in CPM or when walking.  DO NOT modify, tear, cut, or change the foam block in any way.  POST-OPERATIVE OPIOID TAPER INSTRUCTIONS: It is important to wean off of your opioid medication as soon as possible. If you do not need pain medication after your surgery it is ok to stop day one. Opioids include: Codeine, Hydrocodone(Norco, Vicodin), Oxycodone(Percocet, oxycontin) and hydromorphone amongst others.  Long term and even short term use of opiods can cause: Increased pain response Dependence Constipation Depression Respiratory depression And more.  Withdrawal symptoms can include Flu like symptoms Nausea, vomiting And more Techniques to manage these symptoms Hydrate well Eat regular healthy meals Stay active Use relaxation techniques(deep breathing, meditating, yoga) Do Not substitute Alcohol to help with tapering If you have been on opioids for less than two weeks and do not have pain than it is ok to stop all together.  Plan to wean off of opioids This plan should start within one week post op of your joint replacement. Maintain the same interval or time between taking each dose  and first decrease the dose.  Cut the total daily intake of opioids by one tablet each day Next  start to increase the time between doses. The last dose that should be eliminated is the evening dose.   MAKE SURE YOU:  Understand these instructions.  Get help right away if you are not doing well or get worse.    Thank you for letting us be a part of your medical care team.  It is a privilege we respect greatly.  We hope these instructions will help you stay on track for a fast and full recovery!       Information on my medicine - ELIQUIS (apixaban)  This medication education was reviewed with me or my healthcare representative as part of my discharge preparation.  The pharmacist that spoke with me during my hospital stay was:  Otho BellowsGreen, Chizara Mena L, [facility]  Why was Eliquis prescribed for you? Eliquis was prescribed for you to reduce the risk of blood clots forming after orthopedic surgery.    What do You need to know about Eliquis? Take your Eliquis TWICE DAILY - one tablet in the morning and one tablet in the evening with or without food.  It would be best to take the dose about the same time each day.  If you have difficulty swallowing the tablet whole please discuss with your pharmacist how to take the medication safely.  Take Eliquis exactly as prescribed by your doctor and DO NOT stop taking Eliquis without talking to the doctor who prescribed the medication.  Stopping without other medication to take the place of Eliquis may increase your risk of developing a clot.  After discharge, you should have regular check-up appointments with your healthcare provider that is prescribing your Eliquis.  What do you do if you miss a dose? If a dose of ELIQUIS is not taken at the scheduled time, take it as soon as possible on the same day and twice-daily administration should be resumed.  The dose should not be doubled to make up for a missed dose.  Do not take more than one tablet of ELIQUIS  at the same time.  Important Safety Information A possible side effect of Eliquis is bleeding. You should call your healthcare provider right away if you experience any of the following: Bleeding from an injury or your nose that does not stop. Unusual colored urine (red or dark brown) or unusual colored stools (red or black). Unusual bruising for unknown reasons. A serious fall or if you hit your head (even if there is no bleeding).  Some medicines may interact with Eliquis and might increase your risk of bleeding or clotting while on Eliquis. To help avoid this, consult your healthcare provider or pharmacist prior to using any new prescription or non-prescription medications, including herbals, vitamins, non-steroidal anti-inflammatory drugs (NSAIDs) and supplements.  This website has more information on Eliquis (apixaban): http://www.eliquis.com/eliquis/home

## 2021-01-16 NOTE — Op Note (Signed)
NAME: Kirsten Walsh, Kirsten Walsh MEDICAL RECORD NO: [id number] ACCOUNT NO: [id number] DATE OF BIRTH: [birth year] FACILITY: [facility] LOCATION: [facility] PHYSICIAN: Almedia Balls. Ranell Patrick, MD  Operative Report   DATE OF PROCEDURE: 01/16/2021  PREOPERATIVE DIAGNOSIS:  Left knee end-stage arthritis.  POSTOPERATIVE DIAGNOSIS:  Left knee end-stage arthritis.  PROCEDURE PERFORMED:  Left total knee replacement using DePuy Attune prosthesis.  ATTENDING SURGEON:  Malon Kindle, MD  ASSISTANT:  Modesto Charon, [place], who was scrubbed during the entire procedure, and necessary for satisfactory completion of surgery.  ANESTHESIA:  Spinal anesthesia was used plus adductor canal block.  ESTIMATED BLOOD LOSS:  Minimal.  FLUID REPLACEMENT:  1000 mL crystalloid.  Instrument counts were correct.  There were no complications.  Perioperative antibiotics were given.  TOURNIQUET TIME:  1 hour and 20 minutes at 325 mmHg.  INDICATIONS:  The patient is a 60 year old female with worsening left knee pain secondary to end-stage arthritis.  The patient has failed an extended period of conservative management including injections, modification of activity, medications, therapy  and presents for operative total knee arthroplasty to restore function and eliminate pain.  Informed consent obtained.  DESCRIPTION OF PROCEDURE:  After an adequate level of spinal anesthesia was achieved, an adductor canal block was placed.  The patient was placed supine on the operating table.  Nonsterile tourniquet was placed in the left proximal thigh.  Left leg was  sterilely prepped in the usual manner.  Timeout called, verifying correct patient and correct site.  We elevated the leg and exsanguinated with an Esmarch bandage and elevating the tourniquet to 325 mmHg. Placed the knee in flexion, performed a  longitudinal midline incision with a 10 blade scalpel.  Dissection down through subcutaneous tissues.  We performed a medial parapatellar  arthrotomy and then divided the lateral patellofemoral ligaments, everting the patella.  We entered the distal  femur, which had bone-on-bone arthritis with a step cut drill.  We then placed our intramedullary guide for the femoral resection.  We resected 9 mm off the distal femur, 5 degrees left, valgus.  Once we had our distal cut performed, we sized our femur  to a size 6, anterior down. Performed anterior, posterior and chamfer cuts with a 4-in-1 block.  Next, we removed ACL and PCL meniscal tissue, subluxing the tibia anteriorly.  We then performed our tibial cut, resecting 2 mm off the affected medial side  perpendicular to the long axis of the tibia with minimal posterior slope for this posterior cruciate substituting prosthesis.  We resected with an oscillating saw.  We then used a lamina spreader and an osteotome to remove posterior osteophytes off the  femoral condyles.  We then checked our gaps, which were symmetric at 10 mm, both in extension and flexion.  We then completed our tibial preparation, sizing the tibia to a size 5 and then using the modular drill and keel punch to finish our tibial prep.  We had our trial tibia in place.  We then went to the femur and did our box cut for the posterior cruciate substituting femoral prosthesis.  We sized it to a 6 narrow left.  We drilled our lug holes, placed our trial femur on.  We then placed an 8 mm  spacer block and we were able to get the knee reduced and extended, feeling like we could probably get to a 10.  We then resurfaced the patella going from a 25 mm thickness down to a 16 mm thickness and drilling lug holes for the  38 patellar button. With  the patellar button trial in place, we ranged the knee.  We had excellent patellar tracking with no-touch technique.  We irrigated thoroughly, we removed all trial components.  We pulse irrigated the knee and dried the bone and vacuum mixed high  viscosity cement on the back table.  We then cemented  the tibia, femur and patella into place using a patellar clamp and placed the knee in extension with an 8 mm spacer and held the knee until the cement was hardened, we removed excess cement with  quarter-inch curved osteotome.  We did our final inspection.  We then selected the real size 6, 10 mm tibial poly component.  We placed it on the tibial tray and reduced the knee.  We had a nice pop.  Reduced the medial condyle and very stable in flexion  and extension.  We irrigated thoroughly and repaired the parapatellar arthrotomy with #1 Vicryl suture.  We did inject the posterior aspect of the knee with a combination of Marcaine, saline and Exparel while we had the lamina spreader in place, then we  injected all around the suprapatellar pouch and the capsule once we had our final components in place.  We then closed #1 Vicryl suture for the parapatellar arthrotomy followed by 2-0 Vicryl for subcutaneous closure and 4-0 Monocryl for skin.   Steri-Strips were applied followed by sterile dressing.  The patient tolerated surgery well.   SHW D: 01/16/2021 9:54:22 am T: 01/16/2021 10:18:00 am  JOB: [id number]/ [id number]

## 2021-01-16 NOTE — Interval H&P Note (Signed)
History and Physical Interval Note:  01/16/2021 7:33 AM  Kirsten Walsh  has presented today for surgery, with the diagnosis of Left knee end stage Osteoarthritis.  The various methods of treatment have been discussed with the patient and family. After consideration of risks, benefits and other options for treatment, the patient has consented to  Procedure(s) with comments: TOTAL KNEE ARTHROPLASTY (Left) - with adductor canal as a surgical intervention.  The patient's history has been reviewed, patient examined, no change in status, stable for surgery.  I have reviewed the patient's chart and labs.  Questions were answered to the patient's satisfaction.     Verlee Rossetti

## 2021-01-16 NOTE — Transfer of Care (Signed)
Immediate Anesthesia Transfer of Care Note  Patient: Kirsten Walsh  Procedure(s) Performed: TOTAL KNEE ARTHROPLASTY (Left: Knee)  Patient Location: PACU  Anesthesia Type:Regional, Spinal and MAC combined with regional for post-op pain  Level of Consciousness: awake, alert , oriented and patient cooperative  Airway & Oxygen Therapy: Patient Spontanous Breathing and Patient connected to face mask oxygen  Post-op Assessment: Report given to RN and Post -op Vital signs reviewed and stable  Post vital signs: Reviewed and stable  Last Vitals:  Vitals Value Taken Time  BP 109/67 01/16/21 1000  Temp    Pulse 91 01/16/21 1000  Resp 15 01/16/21 1000  SpO2 100 % 01/16/21 1000  Vitals shown include unvalidated device data.  Last Pain:  Vitals:   01/16/21 0552  TempSrc:   PainSc: 0-No pain         Complications: No notable events documented.

## 2021-01-16 NOTE — Anesthesia Procedure Notes (Signed)
Anesthesia Regional Block: Adductor canal block   Pre-Anesthetic Checklist: , timeout performed,  Correct Patient, Correct Site, Correct Laterality,  Correct Procedure, Correct Position, site marked,  Risks and benefits discussed,  Pre-op evaluation,  At surgeon's request and post-op pain management  Laterality: Left  Prep: Maximum Sterile Barrier Precautions used, chloraprep       Needles:  Injection technique: Single-shot  Needle Type: Echogenic Stimulator Needle     Needle Length: 9cm  Needle Gauge: 22     Additional Needles:   Procedures:,,,, ultrasound used (permanent image in chart),,    Narrative:  Start time: 01/16/2021 7:03 AM End time: 01/16/2021 7:05 AM Injection made incrementally with aspirations every 5 mL.  Performed by: Personally  Anesthesiologist: Kaylyn Layer, MD  Additional Notes: Risks, benefits, and alternative discussed. Patient gave consent for procedure. Patient prepped and draped in sterile fashion. Sedation administered, patient remains easily responsive to voice. Relevant anatomy identified with ultrasound guidance. Local anesthetic given in 5cc increments with no signs or symptoms of intravascular injection. No pain or paraesthesias with injection. Patient monitored throughout procedure with signs of LAST or immediate complications. Tolerated well. Ultrasound image placed in chart.  Kirsten Greenhouse, MD

## 2021-01-16 NOTE — Brief Op Note (Signed)
01/16/2021  9:47 AM  PATIENT:  Linard Millers  60 y.o. female  PRE-OPERATIVE DIAGNOSIS:  Left knee end stage Osteoarthritis  POST-OPERATIVE DIAGNOSIS:  Left knee end stage Osteoarthritis  PROCEDURE:  Procedure(s) with comments: TOTAL KNEE ARTHROPLASTY (Left) - with adductor canal DePuy Attune  SURGEON:  Surgeon(s) and Role:    Beverely Low, MD - Primary  PHYSICIAN ASSISTANT:   ASSISTANTS: Thea Gist, PA-C   ANESTHESIA:   regional and spinal  EBL:  30 mL   BLOOD ADMINISTERED:none  DRAINS: none   LOCAL MEDICATIONS USED:  MARCAINE     SPECIMEN:  No Specimen  DISPOSITION OF SPECIMEN:  N/A  COUNTS:  YES  TOURNIQUET:  * Missing tourniquet times found for documented tourniquets in log: [id number] *  DICTATION: .Other Dictation: Dictation Number [id number]  PLAN OF CARE: Admit for overnight observation  PATIENT DISPOSITION:  PACU - hemodynamically stable.   Delay start of Pharmacological VTE agent (>24hrs) due to surgical blood loss or risk of bleeding: no

## 2021-01-16 NOTE — Anesthesia Postprocedure Evaluation (Signed)
Anesthesia Post Note  Patient: LAVONIA EAGER  Procedure(s) Performed: TOTAL KNEE ARTHROPLASTY (Left: Knee)     Patient location during evaluation: PACU Anesthesia Type: Spinal Level of consciousness: awake and alert and oriented Pain management: pain level controlled Vital Signs Assessment: post-procedure vital signs reviewed and stable Respiratory status: spontaneous breathing, nonlabored ventilation and respiratory function stable Cardiovascular status: blood pressure returned to baseline Postop Assessment: no apparent nausea or vomiting, spinal receding, no headache and no backache Anesthetic complications: no   No notable events documented.  Last Vitals:  Vitals:   01/16/21 1130 01/16/21 1145  BP: 117/76 112/75  Pulse: 61 60  Resp: 12 11  Temp:  36.6 C  SpO2: 100% 100%    Last Pain:  Vitals:   01/16/21 1145  TempSrc:   PainSc: Asleep    LLE Motor Response: Purposeful movement (01/16/21 1145) LLE Sensation: Decreased (01/16/21 1145) RLE Motor Response: Purposeful movement (01/16/21 1145) RLE Sensation: Decreased (01/16/21 1145) L Sensory Level: S1-Sole of foot, small toes (01/16/21 1145) R Sensory Level: S1-Sole of foot, small toes (01/16/21 1145)  Kaylyn Layer

## 2021-01-16 NOTE — Progress Notes (Signed)
Orthopedic Tech Progress Note Patient Details:  Kirsten Walsh [birth year] [id number]  Patient ID: Linard Millers, female   DOB: July 03, 1960, 60 y.o.   MRN: [id number]  [name] 01/16/2021, 10:45 AM Cpm applied in pacu. Patient unable to tolerate 60 degrees. Reduced to 40. Bone foam given to pt in pacu. Knee immobilizer applied in OR

## 2021-01-16 NOTE — Anesthesia Procedure Notes (Signed)
Spinal  Patient location during procedure: OR Start time: 01/16/2021 7:40 AM End time: 01/16/2021 7:45 AM Reason for block: surgical anesthesia Staffing Performed: resident/CRNA  Resident/CRNA: Cleda Daub, CRNA Preanesthetic Checklist Completed: patient identified, IV checked, site marked, risks and benefits discussed, surgical consent, monitors and equipment checked, pre-op evaluation and timeout performed Spinal Block Patient position: sitting Prep: DuraPrep Patient monitoring: heart rate, cardiac monitor, continuous pulse ox and blood pressure Approach: midline Location: L3-4 Injection technique: single-shot Needle Needle type: Pencan  Needle gauge: 24 G Needle length: 10 cm Assessment Sensory level: T4 Events: CSF return Additional Notes Checked expiration dates of dura prep and spinal kit; maintained sterile technique; palpated landmarks; anesthetized skin with 1% lidocaine; clear CSF before injection; swirl during injection; pt tolerated well. Bupivacaine dose per Dr. Daiva Huge.

## 2021-01-16 NOTE — Evaluation (Signed)
Physical Therapy Evaluation Patient Details Name: Kirsten Walsh MRN: [id number] DOB: [birth year] Today's Date: 01/16/2021   History of Present Illness  Patient is 60 y.o. female s/p Lt TKA on 01/16/21 with PMH significant for HTN, OA, chest pain, low back pain, syncope.    Clinical Impression  Kirsten Walsh is a 60 y.o. female POD 0 s/p Lt TKA. Patient reports independence with [facility] for mobility PTA. Patient is now limited by functional impairments (see PT problem list below) and was greatly limited by pain this session and unable to complete supine<>sit transfer. Patient will benefit from continued skilled PT interventions to address impairments and progress towards PLOF. Acute PT will follow to progress mobility and stair training in preparation for safe discharge home.     Follow Up Recommendations Follow surgeon's recommendation for DC plan and follow-up therapies;Home health PT    Equipment Recommendations  None recommended by PT    Recommendations for Other Services       Precautions / Restrictions Precautions Precautions: Fall Restrictions Weight Bearing Restrictions: No LLE Weight Bearing: Weight bearing as tolerated      Mobility  Bed Mobility Overal bed mobility: Needs Assistance Bed Mobility: Supine to Sit     Supine to sit: Mod assist;HOB elevated     General bed mobility comments: attempted supine>sit transfer however pt with significant guarding of Lt LE and hlding breath due to pain. Deferred completing mobility and discussed pain expectations with pt.    Transfers                    Ambulation/Gait                Stairs            Wheelchair Mobility    Modified Rankin (Stroke Patients Only)       Balance                                             Pertinent Vitals/Pain Pain Assessment: 0-10 Pain Score: 10-Worst pain ever Pain Location: Lt knee Pain Descriptors / Indicators:  Aching;Discomfort Pain Intervention(s): Limited activity within patient's tolerance;Monitored during session;Ice applied;Premedicated before session    Home Living Family/patient expects to be discharged to:: Private residence Living Arrangements: Children Available Help at Discharge: Family;Friend(s) Type of Home: House Home Access: Stairs to enter Entrance Stairs-Rails: None Entrance Stairs-Number of Steps: 3 Home Layout: Multi-level;Able to live on main level with bedroom/bathroom;Full bath on main level;Bed/bath upstairs Home Equipment: Walker - 2 wheels;Bedside commode;Cane - single point Additional Comments: pt's child and her boyfriend will assist her with recovery.    Prior Function Level of Independence: Independent with assistive device(s)         Comments: pt has been mobilizing with SPC due to pain.     Hand Dominance        Extremity/Trunk Assessment   Upper Extremity Assessment Upper Extremity Assessment: Overall WFL for tasks assessed    Lower Extremity Assessment Lower Extremity Assessment: LLE deficits/detail LLE: Unable to fully assess due to pain       Communication   Communication: No difficulties  Cognition Arousal/Alertness: Awake/alert Behavior During Therapy: WFL for tasks assessed/performed Overall Cognitive Status: Within Functional Limits for tasks assessed  General Comments      Exercises     Assessment/Plan    PT Assessment Patient needs continued PT services  PT Problem List Decreased strength;Decreased range of motion;Decreased activity tolerance;Decreased balance;Decreased mobility;Decreased knowledge of use of DME;Decreased safety awareness;Decreased knowledge of precautions;Pain;Obesity       PT Treatment Interventions DME instruction;Gait training;Stair training;Functional mobility training;Therapeutic activities;Therapeutic exercise;Balance training;Neuromuscular  re-education;Patient/family education    PT Goals (Current goals can be found in the Care Plan section)  Acute Rehab PT Goals Patient Stated Goal: stop hurting PT Goal Formulation: With patient Time For Goal Achievement: 01/23/21    Frequency 7X/week   Barriers to discharge        Co-evaluation               AM-PAC PT "6 Clicks" Mobility  Outcome Measure Help needed turning from your back to your side while in a flat bed without using bedrails?: A Lot Help needed moving from lying on your back to sitting on the side of a flat bed without using bedrails?: A Lot Help needed moving to and from a bed to a chair (including a wheelchair)?: A Lot Help needed standing up from a chair using your arms (e.g., wheelchair or bedside chair)?: A Lot Help needed to walk in hospital room?: A Lot Help needed climbing 3-5 steps with a railing? : Total 6 Click Score: 11    End of Session   Activity Tolerance: Patient limited by pain Patient left: in bed;with call bell/phone within reach;with bed alarm set;with family/visitor present Nurse Communication: Mobility status PT Visit Diagnosis: Muscle weakness (generalized) (M62.81);Difficulty in walking, not elsewhere classified (R26.2)    Time: 0174-9449 PT Time Calculation (min) (ACUTE ONLY): 16 min   Charges:   PT Evaluation $PT Eval Low Complexity: 1 Low          Wynn Maudlin, DPT Acute Rehabilitation Services Office [phone] Pager [phone]   Anitra Lauth 01/16/2021, 6:18 PM

## 2021-01-16 NOTE — Progress Notes (Signed)
Orthopedic Tech Progress Note Patient Details:  Kirsten Walsh January 15, 1961 [id number]  Patient ID: Kirsten Walsh, female   DOB: 05-Nov-1960, 60 y.o.   MRN: [id number]  [name] 01/16/2021, 1:06 PM Cpm removed by RN in pacu. Patient unable to tolerate d/t pain

## 2021-01-17 ENCOUNTER — Encounter (HOSPITAL_COMMUNITY): Payer: Self-pay | Admitting: Certified Registered Nurse Anesthetist

## 2021-01-17 DIAGNOSIS — G894 Chronic pain syndrome: Secondary | ICD-10-CM | POA: Diagnosis not present

## 2021-01-17 LAB — CBC
HCT: 37.9 % (ref 36.0–46.0)
Hemoglobin: 12.4 g/dL (ref 12.0–15.0)
MCH: 30.7 pg (ref 26.0–34.0)
MCHC: 32.7 g/dL (ref 30.0–36.0)
MCV: 93.8 fL (ref 80.0–100.0)
Platelets: 190 K/uL (ref 150–400)
RBC: 4.04 MIL/uL (ref 3.87–5.11)
RDW: 13.7 % (ref 11.5–15.5)
WBC: 7.6 K/uL (ref 4.0–10.5)
nRBC: 0 % (ref 0.0–0.2)

## 2021-01-17 LAB — BASIC METABOLIC PANEL WITH GFR
Anion gap: 7 (ref 5–15)
BUN: 11 mg/dL (ref 6–20)
CO2: 25 mmol/L (ref 22–32)
Calcium: 8.7 mg/dL — ABNORMAL LOW (ref 8.9–10.3)
Chloride: 102 mmol/L (ref 98–111)
Creatinine, Ser: 0.98 mg/dL (ref 0.44–1.00)
GFR, Estimated: >60 mL/min (ref >60)
Glucose, Bld: 149 mg/dL — ABNORMAL HIGH (ref 70–99)
Potassium: 3.1 mmol/L — ABNORMAL LOW (ref 3.5–5.1)
Sodium: 134 mmol/L — ABNORMAL LOW (ref 135–145)

## 2021-01-17 MED ORDER — HYDROMORPHONE HCL 2 MG PO TABS
4.0000 mg | ORAL_TABLET | ORAL | Status: DC | PRN
  Administered 2021-01-17 – 2021-01-21 (×15): 4 mg via ORAL
  Filled 2021-01-17 (×17): qty 2

## 2021-01-17 NOTE — Plan of Care (Signed)
  Problem: Education: Goal: Knowledge of General Education information will improve Description Including pain rating scale, medication(s)/side effects and non-pharmacologic comfort measures Outcome: Progressing   Problem: Activity: Goal: Risk for activity intolerance will decrease Outcome: Progressing   Problem: Safety: Goal: Ability to remain free from injury will improve Outcome: Progressing   

## 2021-01-17 NOTE — Progress Notes (Signed)
Physical Therapy Treatment Patient Details Name: Kirsten Walsh MRN: [id number] DOB: [birth year] Today's Date: 01/17/2021    History of Present Illness Patient is 60 y.o. female s/p Lt TKA on 01/16/21 with PMH significant for HTN, OA, chest pain, low back pain, syncope.    PT Comments    Pt is cooperative but continues pain/fatigue limited and requiring increased time and significant assist for performance of all mobility tasks.  Follow Up Recommendations  Follow surgeon's recommendation for DC plan and follow-up therapies;Home health PT     Equipment Recommendations  None recommended by PT    Recommendations for Other Services       Precautions / Restrictions Precautions Precautions: Fall Restrictions Weight Bearing Restrictions: No LLE Weight Bearing: Weight bearing as tolerated    Mobility  Bed Mobility Overal bed mobility: Needs Assistance Bed Mobility: Supine to Sit     Supine to sit: HOB elevated;Min assist;Mod assist     General bed mobility comments: Increased time with cues for sequence and use of R LE to selft assist.  Physical assist to manage L LE, to control trunk and to complete rotation to EOB sitting using bed pad.    Transfers Overall transfer level: Needs assistance Equipment used: Rolling walker (2 wheeled) Transfers: Sit to/from Stand Sit to Stand: Min assist;Mod assist;+2 physical assistance;+2 safety/equipment;From elevated surface         General transfer comment: Increased time with 1cues for LE management and use of UEs to self assist.  Physical assist to bring wt up and fwd ant to balance in standing.  Ambulation/Gait Ambulation/Gait assistance: Min assist;Mod assist;+2 safety/equipment Gait Distance (Feet): 4 Feet Assistive device: Rolling walker (2 wheeled) Gait Pattern/deviations: Step-to pattern;Decreased step length - right;Decreased step length - left;Decreased stance time - left;Shuffle;Trunk flexed Gait velocity: dec    General Gait Details: INcreased time with cues for sequence, posture and position from RW.  Pt very unsteady and requiring step-by-step cues.  Distance ltd by c/o dizziness/fatigue   Stairs             Wheelchair Mobility    Modified Rankin (Stroke Patients Only)       Balance Overall balance assessment: Needs assistance Sitting-balance support: No upper extremity supported;Feet supported Sitting balance-Leahy Scale: Fair     Standing balance support: Bilateral upper extremity supported Standing balance-Leahy Scale: Poor                              Cognition Arousal/Alertness: Awake/alert Behavior During Therapy: WFL for tasks assessed/performed Overall Cognitive Status: Within Functional Limits for tasks assessed                                        Exercises Total Joint Exercises Ankle Circles/Pumps: AROM;Both;15 reps;Supine Quad Sets: AROM;Both;10 reps;Supine Heel Slides: AAROM;Left;10 reps;Supine Straight Leg Raises: AAROM;Left;10 reps;Supine Goniometric ROM: AAROM L knee -8 - 30 pain limited    General Comments        Pertinent Vitals/Pain Pain Assessment: 0-10 Pain Score: 8  Pain Location: Lt knee Pain Descriptors / Indicators: Aching;Discomfort;Grimacing;Guarding Pain Intervention(s): Limited activity within patient's tolerance;Monitored during session;Premedicated before session;Ice applied    Home Living                      Prior Function  PT Goals (current goals can now be found in the care plan section) Acute Rehab PT Goals Patient Stated Goal: stop hurting PT Goal Formulation: With patient Time For Goal Achievement: 01/23/21 Progress towards PT goals: Progressing toward goals    Frequency    7X/week      PT Plan Current plan remains appropriate    Co-evaluation              AM-PAC PT "6 Clicks" Mobility   Outcome Measure  Help needed turning from your back to your  side while in a flat bed without using bedrails?: A Lot Help needed moving from lying on your back to sitting on the side of a flat bed without using bedrails?: A Lot Help needed moving to and from a bed to a chair (including a wheelchair)?: A Lot Help needed standing up from a chair using your arms (e.g., wheelchair or bedside chair)?: A Lot Help needed to walk in hospital room?: A Lot Help needed climbing 3-5 steps with a railing? : Total 6 Click Score: 11    End of Session Equipment Utilized During Treatment: Gait belt;Left knee immobilizer Activity Tolerance: Patient limited by pain;Patient limited by fatigue Patient left: in chair;with call bell/phone within reach;with chair alarm set;with nursing/sitter in room Nurse Communication: Mobility status PT Visit Diagnosis: Muscle weakness (generalized) (M62.81);Difficulty in walking, not elsewhere classified (R26.2)     Time: 3790-2409 PT Time Calculation (min) (ACUTE ONLY): 38 min  Charges:  $Gait Training: 8-22 mins $Therapeutic Exercise: 8-22 mins $Therapeutic Activity: 8-22 mins                     Mauro Kaufmann PT Acute Rehabilitation Services Pager [phone] Office [phone]    Shemika Robbs 01/17/2021, 4:05 PM

## 2021-01-17 NOTE — Progress Notes (Signed)
    Subjective:  Patient reports pain as moderate.  Denies N/V/CP/SOB.   Objective:   VITALS:   Vitals:   01/16/21 2232 01/17/21 0139 01/17/21 0222 01/17/21 0607  BP: 120/65 123/74 131/71 116/68  Pulse: 62 72 71 66  Resp: 16 20 16 17   Temp: 98.2 F (36.8 C) 98.2 F (36.8 C) 98.6 F (37 C) 98.9 F (37.2 C)  TempSrc: Oral Oral Oral Oral  SpO2: 92% 100% 96% 95%  Weight:      Height:        NAD ABD soft Neurovascular intact Sensation intact distally Intact pulses distally Dorsiflexion/Plantar flexion intact Incision: dressing C/D/I   Lab Results  Component Value Date   WBC 7.6 01/17/2021   HGB 12.4 01/17/2021   HCT 37.9 01/17/2021   MCV 93.8 01/17/2021   PLT 190 01/17/2021   BMET    Component Value Date/Time   NA 134 (L) 01/17/2021 0308   NA 140 02/18/2020 1447   K 3.1 (L) 01/17/2021 0308   CL 102 01/17/2021 0308   CO2 25 01/17/2021 0308   GLUCOSE 149 (H) 01/17/2021 0308   BUN 11 01/17/2021 0308   BUN 16 02/18/2020 1447   CREATININE 0.98 01/17/2021 0308   CREATININE 1.00 08/14/2020 1050   CALCIUM 8.7 (L) 01/17/2021 0308   GFRNONAA >60 01/17/2021 0308   GFRNONAA 62 08/14/2020 1050   GFRAA 72 08/14/2020 1050     Assessment/Plan: 1 Day Post-Op   Active Problems:   H/O total knee replacement, left   WBAT with walker Seen in rounds for Dr.Norris DVT ppx:  Apixaban , SCDs, TEDS PO pain control PT/OT Dispo: Pending PT/OT clearance. Plan on Sunday D/C     Sunday 01/17/2021, 7:54 AM [facility] is now [facility] [address], [address], [place], [place] Phone: [phone] [email or web address] Facebook  [phone]

## 2021-01-17 NOTE — Progress Notes (Signed)
Physical Therapy Treatment Patient Details Name: Kirsten Walsh MRN: [id number] DOB: 1961-04-03 Today's Date: 01/17/2021    History of Present Illness Patient is 60 y.o. female s/p Lt TKA on 01/16/21 with PMH significant for HTN, OA, chest pain, low back pain, syncope.    PT Comments    Pt continues cooperative but ltd by pain and fatigue; and continues to require significant assist for all basic mobility tasks.  Pt up to ambulate limited distance to return to bed to allow bladder scan by CNA.    Follow Up Recommendations  Follow surgeon's recommendation for DC plan and follow-up therapies;Home health PT     Equipment Recommendations  None recommended by PT    Recommendations for Other Services       Precautions / Restrictions Precautions Precautions: Fall Restrictions Weight Bearing Restrictions: No LLE Weight Bearing: Weight bearing as tolerated    Mobility  Bed Mobility Overal bed mobility: Needs Assistance Bed Mobility: Sit to Supine       Sit to supine: Mod assist   General bed mobility comments: cues for sequence with phsyical assist to manage LEs and to control trunk    Transfers Overall transfer level: Needs assistance Equipment used: Rolling walker (2 wheeled) Transfers: Sit to/from Stand Sit to Stand: Min assist;Mod assist;+2 physical assistance;+2 safety/equipment;From elevated surface         General transfer comment: Increased time with cues for LE management and use of UEs to self assist.  Physical assist to bring wt up and fwd and to balance in standing.  Ambulation/Gait Ambulation/Gait assistance: Min assist;Mod assist;+2 safety/equipment Gait Distance (Feet): 6 Feet Assistive device: Rolling walker (2 wheeled) Gait Pattern/deviations: Step-to pattern;Decreased step length - right;Decreased step length - left;Decreased stance time - left;Shuffle;Trunk flexed Gait velocity: dec   General Gait Details: INcreased time with cues for sequence,  posture and position from RW.  Pt very unsteady and requiring step-by-step cues.  Distance ltd 2* fatigue and pt ongoing shakiness.   Stairs             Wheelchair Mobility    Modified Rankin (Stroke Patients Only)       Balance Overall balance assessment: Needs assistance Sitting-balance support: No upper extremity supported;Feet supported Sitting balance-Leahy Scale: Fair     Standing balance support: Bilateral upper extremity supported Standing balance-Leahy Scale: Poor                              Cognition Arousal/Alertness: Awake/alert Behavior During Therapy: WFL for tasks assessed/performed Overall Cognitive Status: Within Functional Limits for tasks assessed                                        Exercises      General Comments        Pertinent Vitals/Pain Pain Assessment: 0-10 Pain Score: 7  Pain Location: Lt knee Pain Descriptors / Indicators: Aching;Discomfort;Grimacing;Guarding Pain Intervention(s): Limited activity within patient's tolerance;Monitored during session;Premedicated before session;Ice applied    Home Living                      Prior Function            PT Goals (current goals can now be found in the care plan section) Acute Rehab PT Goals Patient Stated Goal: stop hurting PT Goal Formulation: With patient  Time For Goal Achievement: 01/23/21 Progress towards PT goals: Progressing toward goals    Frequency    7X/week      PT Plan Current plan remains appropriate    Co-evaluation              AM-PAC PT "6 Clicks" Mobility   Outcome Measure  Help needed turning from your back to your side while in a flat bed without using bedrails?: A Lot Help needed moving from lying on your back to sitting on the side of a flat bed without using bedrails?: A Lot Help needed moving to and from a bed to a chair (including a wheelchair)?: A Lot Help needed standing up from a chair using  your arms (e.g., wheelchair or bedside chair)?: A Lot Help needed to walk in hospital room?: A Lot Help needed climbing 3-5 steps with a railing? : Total 6 Click Score: 11    End of Session Equipment Utilized During Treatment: Gait belt;Left knee immobilizer Activity Tolerance: Patient limited by pain;Patient limited by fatigue Patient left: with call bell/phone within reach;with nursing/sitter in room;in bed Nurse Communication: Mobility status PT Visit Diagnosis: Muscle weakness (generalized) (M62.81);Difficulty in walking, not elsewhere classified (R26.2)     Time: 3474-2595 PT Time Calculation (min) (ACUTE ONLY): 12 min  Charges:  $Gait Training: 8-22 mins                     Mauro Kaufmann PT Acute Rehabilitation Services Pager [phone] Office [phone]    Laith Antonelli 01/17/2021, 5:06 PM

## 2021-01-17 NOTE — Progress Notes (Signed)
Physical Therapy Treatment Patient Details Name: Kirsten Walsh MRN: [id number] DOB: Feb 16, 1961 Today's Date: 01/17/2021    History of Present Illness Patient is 60 y.o. female s/p Lt TKA on 01/16/21 with PMH significant for HTN, OA, chest pain, low back pain, syncope.    PT Comments    Pt continues cooperative but ltd by pain and fatigue.  Pt up to ambulate limited distance to [facility] to attempt toileting with CNA.   Follow Up Recommendations  Follow surgeon's recommendation for DC plan and follow-up therapies;Home health PT     Equipment Recommendations  None recommended by PT    Recommendations for Other Services       Precautions / Restrictions Precautions Precautions: Fall Restrictions Weight Bearing Restrictions: No LLE Weight Bearing: Weight bearing as tolerated    Mobility  Bed Mobility               General bed mobility comments: Pt in recliner on arrival and on BSC at session end    Transfers Overall transfer level: Needs assistance Equipment used: Rolling walker (2 wheeled) Transfers: Sit to/from Stand Sit to Stand: Min assist;Mod assist;+2 physical assistance;+2 safety/equipment;From elevated surface         General transfer comment: Increased time with cues for LE management and use of UEs to self assist.  Physical assist to bring wt up and fwd and to balance in standing.  Ambulation/Gait Ambulation/Gait assistance: Min assist;Mod assist;+2 safety/equipment Gait Distance (Feet): 6 Feet Assistive device: Rolling walker (2 wheeled) Gait Pattern/deviations: Step-to pattern;Decreased step length - right;Decreased step length - left;Decreased stance time - left;Shuffle;Trunk flexed Gait velocity: dec   General Gait Details: INcreased time with cues for sequence, posture and position from RW.  Pt very unsteady and requiring step-by-step cues.  Distance ltd 2* fatigue and pt ongoing shakiness.   Stairs             Wheelchair Mobility     Modified Rankin (Stroke Patients Only)       Balance Overall balance assessment: Needs assistance Sitting-balance support: No upper extremity supported;Feet supported Sitting balance-Leahy Scale: Fair     Standing balance support: Bilateral upper extremity supported Standing balance-Leahy Scale: Poor                              Cognition Arousal/Alertness: Awake/alert Behavior During Therapy: WFL for tasks assessed/performed Overall Cognitive Status: Within Functional Limits for tasks assessed                                        Exercises      General Comments        Pertinent Vitals/Pain Pain Assessment: 0-10 Pain Score: 7  Pain Location: Lt knee Pain Descriptors / Indicators: Aching;Discomfort;Grimacing;Guarding Pain Intervention(s): Limited activity within patient's tolerance;Monitored during session;Premedicated before session    Home Living                      Prior Function            PT Goals (current goals can now be found in the care plan section) Acute Rehab PT Goals Patient Stated Goal: stop hurting PT Goal Formulation: With patient Time For Goal Achievement: 01/23/21 Progress towards PT goals: Progressing toward goals    Frequency    7X/week      PT Plan  Current plan remains appropriate    Co-evaluation              AM-PAC PT "6 Clicks" Mobility   Outcome Measure  Help needed turning from your back to your side while in a flat bed without using bedrails?: A Lot Help needed moving from lying on your back to sitting on the side of a flat bed without using bedrails?: A Lot Help needed moving to and from a bed to a chair (including a wheelchair)?: A Lot Help needed standing up from a chair using your arms (e.g., wheelchair or bedside chair)?: A Lot Help needed to walk in hospital room?: A Lot Help needed climbing 3-5 steps with a railing? : Total 6 Click Score: 11    End of Session  Equipment Utilized During Treatment: Gait belt;Left knee immobilizer Activity Tolerance: Patient limited by pain;Patient limited by fatigue Patient left: with call bell/phone within reach;with nursing/sitter in room;Other (comment) [facility]) Nurse Communication: Mobility status PT Visit Diagnosis: Muscle weakness (generalized) (M62.81);Difficulty in walking, not elsewhere classified (R26.2)     Time: 2694-8546 PT Time Calculation (min) (ACUTE ONLY): 13 min  Charges:  $Gait Training: 8-22 mins                     Mauro Kaufmann PT Acute Rehabilitation Services Pager [phone] Office [phone]    Reann Dobias 01/17/2021, 4:58 PM

## 2021-01-17 NOTE — Progress Notes (Signed)
Called to floor by RN to assist with difficult IV placement. Assessed with Ultrasound. Not able to establish IV access at this time. Recommended further consult from IVT for possible midline catheter.

## 2021-01-17 NOTE — TOC Transition Note (Signed)
Transition of Care [facility]) - CM/SW Discharge Note   Patient Details  Name: Kirsten Walsh MRN: [id number] Date of Birth: 1961/01/02  [facility] CM/SW Contact:  Lennart Pall, LCSW Phone Number: 01/17/2021, 10:06 AM   Clinical Narrative:     Met with pt and spouse yesterday.  Confirming pt has all needed DME at home.  Plan for OPPT at [facility].  No further TOC needs.  Per PA, anticipate dc tomorrow.  Final next level of care: OP Rehab Barriers to Discharge: No Barriers Identified   Patient Goals and CMS Choice Patient states their goals for this hospitalization and ongoing recovery are:: return home      Discharge Placement                       Discharge Plan and Services                DME Arranged: N/A DME Agency: NA                  Social Determinants of Health (SDOH) Interventions     Readmission Risk Interventions No flowsheet data found.

## 2021-01-18 DIAGNOSIS — Z79899 Other long term (current) drug therapy: Secondary | ICD-10-CM | POA: Diagnosis not present

## 2021-01-18 DIAGNOSIS — I10 Essential (primary) hypertension: Secondary | ICD-10-CM | POA: Diagnosis not present

## 2021-01-18 DIAGNOSIS — Z87892 Personal history of anaphylaxis: Secondary | ICD-10-CM | POA: Diagnosis not present

## 2021-01-18 DIAGNOSIS — G8929 Other chronic pain: Secondary | ICD-10-CM | POA: Diagnosis not present

## 2021-01-18 DIAGNOSIS — Z888 Allergy status to other drugs, medicaments and biological substances status: Secondary | ICD-10-CM | POA: Diagnosis not present

## 2021-01-18 DIAGNOSIS — M25762 Osteophyte, left knee: Secondary | ICD-10-CM | POA: Diagnosis not present

## 2021-01-18 DIAGNOSIS — Z886 Allergy status to analgesic agent status: Secondary | ICD-10-CM | POA: Diagnosis not present

## 2021-01-18 DIAGNOSIS — G894 Chronic pain syndrome: Secondary | ICD-10-CM | POA: Diagnosis not present

## 2021-01-18 DIAGNOSIS — Z96652 Presence of left artificial knee joint: Secondary | ICD-10-CM

## 2021-01-18 DIAGNOSIS — Z91018 Allergy to other foods: Secondary | ICD-10-CM | POA: Diagnosis not present

## 2021-01-18 DIAGNOSIS — Z87442 Personal history of urinary calculi: Secondary | ICD-10-CM | POA: Diagnosis not present

## 2021-01-18 DIAGNOSIS — M1712 Unilateral primary osteoarthritis, left knee: Secondary | ICD-10-CM | POA: Diagnosis not present

## 2021-01-18 DIAGNOSIS — M545 Low back pain, unspecified: Secondary | ICD-10-CM | POA: Diagnosis not present

## 2021-01-18 LAB — CBC
HCT: 38.3 % (ref 36.0–46.0)
Hemoglobin: 12.4 g/dL (ref 12.0–15.0)
MCH: 30.2 pg (ref 26.0–34.0)
MCHC: 32.4 g/dL (ref 30.0–36.0)
MCV: 93.2 fL (ref 80.0–100.0)
Platelets: 184 10*3/uL (ref 150–400)
RBC: 4.11 MIL/uL (ref 3.87–5.11)
RDW: 14 % (ref 11.5–15.5)
WBC: 9 10*3/uL (ref 4.0–10.5)
nRBC: 0 % (ref 0.0–0.2)

## 2021-01-18 NOTE — Progress Notes (Signed)
Physical Therapy Treatment Patient Details Name: Kirsten Walsh MRN: [id number] DOB: [birth year] Today's Date: 01/18/2021    History of Present Illness Patient is 60 y.o. female s/p Lt TKA on 01/16/21 with PMH significant for HTN, OA, chest pain, low back pain, syncope.    PT Comments    Pt with increased level of alertness and with slow improvement in activity tolerance.  Pt up to ambulate increased (but still limited) distance in hall.  Pt with increased motivation this pm but continues pain limited and fatigues easily.  Follow Up Recommendations  Follow surgeon's recommendation for DC plan and follow-up therapies;Home health PT     Equipment Recommendations  None recommended by PT    Recommendations for Other Services       Precautions / Restrictions Precautions Precautions: Fall Restrictions Weight Bearing Restrictions: No LLE Weight Bearing: Weight bearing as tolerated    Mobility  Bed Mobility Overal bed mobility: Needs Assistance Bed Mobility: Supine to Sit     Supine to sit: HOB elevated;Min assist;Mod assist     General bed mobility comments: cues for sequence with phsyical assist to manage L LE and to control trunk    Transfers Overall transfer level: Needs assistance Equipment used: Rolling walker (2 wheeled) Transfers: Sit to/from Stand Sit to Stand: Min assist         General transfer comment: Increased time with cues for LE management and use of UEs to self assist.  Physical assist to bring wt up and fwd and to balance in standing.  Ambulation/Gait Ambulation/Gait assistance: Min assist;+2 safety/equipment Gait Distance (Feet): 35 Feet Assistive device: Rolling walker (2 wheeled) Gait Pattern/deviations: Step-to pattern;Decreased step length - right;Decreased step length - left;Decreased stance time - left;Shuffle;Trunk flexed Gait velocity: dec   General Gait Details: INcreased time with cues for sequence, posture and position from RW.  Pt  very unsteady and shaky.  Distance ltd by fatigue   Stairs             Wheelchair Mobility    Modified Rankin (Stroke Patients Only)       Balance Overall balance assessment: Needs assistance Sitting-balance support: No upper extremity supported;Feet supported Sitting balance-Leahy Scale: Fair     Standing balance support: Bilateral upper extremity supported Standing balance-Leahy Scale: Poor                              Cognition Arousal/Alertness: Awake/alert Behavior During Therapy: WFL for tasks assessed/performed Overall Cognitive Status: Within Functional Limits for tasks assessed                                        Exercises Total Joint Exercises Ankle Circles/Pumps: AROM;Both;15 reps;Supine Quad Sets: AROM;Both;10 reps;Supine Heel Slides: AAROM;Left;Supine;15 reps Straight Leg Raises: AAROM;Left;Supine;15 reps Goniometric ROM: -5 - 40    General Comments        Pertinent Vitals/Pain Pain Assessment: 0-10 Pain Score: 6  Pain Location: Lt knee Pain Descriptors / Indicators: Aching;Discomfort;Grimacing;Guarding Pain Intervention(s): Limited activity within patient's tolerance;Monitored during session;Premedicated before session    Home Living                      Prior Function            PT Goals (current goals can now be found in the care plan section) Acute Rehab  PT Goals Patient Stated Goal: Regain IND PT Goal Formulation: With patient Time For Goal Achievement: 01/23/21 Progress towards PT goals: Progressing toward goals    Frequency    7X/week      PT Plan Current plan remains appropriate    Co-evaluation              AM-PAC PT "6 Clicks" Mobility   Outcome Measure  Help needed turning from your back to your side while in a flat bed without using bedrails?: A Lot Help needed moving from lying on your back to sitting on the side of a flat bed without using bedrails?: A Lot Help  needed moving to and from a bed to a chair (including a wheelchair)?: A Lot Help needed standing up from a chair using your arms (e.g., wheelchair or bedside chair)?: A Little Help needed to walk in hospital room?: A Little Help needed climbing 3-5 steps with a railing? : A Lot 6 Click Score: 14    End of Session Equipment Utilized During Treatment: Gait belt;Left knee immobilizer Activity Tolerance: Patient limited by fatigue Patient left: with call bell/phone within reach;with nursing/sitter in room;in chair;with chair alarm set Nurse Communication: Mobility status PT Visit Diagnosis: Muscle weakness (generalized) (M62.81);Difficulty in walking, not elsewhere classified (R26.2)     Time: 1340-1401 PT Time Calculation (min) (ACUTE ONLY): 21 min  Charges:  $Gait Training: 8-22 mins $Therapeutic Exercise: 8-22 mins                     Mauro Kaufmann PT Acute Rehabilitation Services Pager [phone] Office [phone]    Mychal Durio 01/18/2021, 3:25 PM

## 2021-01-18 NOTE — Plan of Care (Signed)
  Problem: Education: Goal: Knowledge of General Education information will improve Description: Including pain rating scale, medication(s)/side effects and non-pharmacologic comfort measures Outcome: Progressing   Problem: Activity: Goal: Risk for activity intolerance will decrease Outcome: Progressing   Problem: Pain Managment: Goal: General experience of comfort will improve Outcome: Progressing   

## 2021-01-18 NOTE — Progress Notes (Addendum)
Patient ID: Kirsten Walsh, female   DOB: 1961/03/17, 60 y.o.   MRN: [id number] Subjective: 2 Days Post-Op Procedure(s) (LRB): TOTAL KNEE ARTHROPLASTY (Left)    Patient reports pain as moderate.  No significant medical issues. Due to unanticipated pain levels and thus decreased progress with PT she required conversion to inpatient admission.  Objective:   VITALS:   Vitals:   01/17/21 2151 01/18/21 0453  BP: 121/88 101/75  Pulse: 83 79  Resp: 17 17  Temp: 99.4 F (37.4 C) 99.9 F (37.7 C)  SpO2: 100% 98%    Neurovascular intact Incision: dressing C/D/I - left knee post surgical dressing removed and Aquacel dressing applied to wound  LABS Recent Labs    01/17/21 0308 01/18/21 0324  HGB 12.4 12.4  HCT 37.9 38.3  WBC 7.6 9.0  PLT 190 184    Recent Labs    01/17/21 0308  NA 134*  K 3.1*  BUN 11  CREATININE 0.98  GLUCOSE 149*    No results for input(s): LABPT, INR in the last 72 hours.   Assessment/Plan: 2 Days Post-Op Procedure(s) (LRB): TOTAL KNEE ARTHROPLASTY (Left)   Up with therapy Changed admission status Can be discharged home if there is significant progress with PT and she is deemed safe to be discharged home otherwise continue admission for inpatient PT Due to reported aspirin allergy no Toradol prescribed

## 2021-01-18 NOTE — Progress Notes (Signed)
Physical Therapy Treatment Patient Details Name: Kirsten Walsh MRN: [id number] DOB: Jan 08, 1961 Today's Date: 01/18/2021    History of Present Illness Patient is 60 y.o. female s/p Lt TKA on 01/16/21 with PMH significant for HTN, OA, chest pain, low back pain, syncope.    PT Comments    Pt with increased level of alertness and with noted improvement in activity tolerance.  Pt performed therex program with assist and up to ambulate increased (but still limited) distance in hall.  Pt with increased motivation this am but continues pain limited and fatigues easily.  Follow Up Recommendations  Follow surgeon's recommendation for DC plan and follow-up therapies;Home health PT     Equipment Recommendations  None recommended by PT    Recommendations for Other Services       Precautions / Restrictions Precautions Precautions: Fall Restrictions Weight Bearing Restrictions: No LLE Weight Bearing: Weight bearing as tolerated    Mobility  Bed Mobility Overal bed mobility: Needs Assistance Bed Mobility: Supine to Sit     Supine to sit: HOB elevated;Min assist;Mod assist     General bed mobility comments: cues for sequence with phsyical assist to manage L LE and to control trunk    Transfers Overall transfer level: Needs assistance Equipment used: Rolling walker (2 wheeled) Transfers: Sit to/from Stand Sit to Stand: Min assist;Mod assist         General transfer comment: Increased time with cues for LE management and use of UEs to self assist.  Physical assist to bring wt up and fwd and to balance in standing.  Ambulation/Gait Ambulation/Gait assistance: Min assist;+2 safety/equipment (chair follow) Gait Distance (Feet): 18 Feet Assistive device: Rolling walker (2 wheeled) Gait Pattern/deviations: Step-to pattern;Decreased step length - right;Decreased step length - left;Decreased stance time - left;Shuffle;Trunk flexed Gait velocity: dec   General Gait Details:  INcreased time with cues for sequence, posture and position from RW.  Pt very unsteady and shaky.  Distance ltd by fatigue   Stairs             Wheelchair Mobility    Modified Rankin (Stroke Patients Only)       Balance Overall balance assessment: Needs assistance Sitting-balance support: No upper extremity supported;Feet supported Sitting balance-Leahy Scale: Fair     Standing balance support: Bilateral upper extremity supported Standing balance-Leahy Scale: Poor                              Cognition Arousal/Alertness: Awake/alert Behavior During Therapy: WFL for tasks assessed/performed Overall Cognitive Status: Within Functional Limits for tasks assessed                                        Exercises Total Joint Exercises Ankle Circles/Pumps: AROM;Both;15 reps;Supine Quad Sets: AROM;Both;10 reps;Supine Heel Slides: AAROM;Left;Supine;15 reps Straight Leg Raises: AAROM;Left;Supine;15 reps Goniometric ROM: -5 - 40    General Comments        Pertinent Vitals/Pain Pain Assessment: 0-10 Pain Score: 7  Pain Location: Lt knee Pain Descriptors / Indicators: Aching;Discomfort;Grimacing;Guarding Pain Intervention(s): Limited activity within patient's tolerance;Monitored during session;Premedicated before session;Ice applied    Home Living                      Prior Function            PT Goals (current goals can  now be found in the care plan section) Acute Rehab PT Goals Patient Stated Goal: Regain IND PT Goal Formulation: With patient Time For Goal Achievement: 01/23/21 Progress towards PT goals: Progressing toward goals    Frequency    7X/week      PT Plan Current plan remains appropriate    Co-evaluation              AM-PAC PT "6 Clicks" Mobility   Outcome Measure  Help needed turning from your back to your side while in a flat bed without using bedrails?: A Lot Help needed moving from lying on  your back to sitting on the side of a flat bed without using bedrails?: A Lot Help needed moving to and from a bed to a chair (including a wheelchair)?: A Lot Help needed standing up from a chair using your arms (e.g., wheelchair or bedside chair)?: A Lot Help needed to walk in hospital room?: A Lot Help needed climbing 3-5 steps with a railing? : Total 6 Click Score: 11    End of Session Equipment Utilized During Treatment: Gait belt;Left knee immobilizer Activity Tolerance: Patient limited by pain;Patient limited by fatigue Patient left: with call bell/phone within reach;with nursing/sitter in room;in chair;with chair alarm set Nurse Communication: Mobility status PT Visit Diagnosis: Muscle weakness (generalized) (M62.81);Difficulty in walking, not elsewhere classified (R26.2)     Time: 8453-6468 PT Time Calculation (min) (ACUTE ONLY): 32 min  Charges:  $Gait Training: 8-22 mins $Therapeutic Exercise: 8-22 mins                     Mauro Kaufmann PT Acute Rehabilitation Services Pager [phone] Office [phone]    Jafeth Mustin 01/18/2021, 12:43 PM

## 2021-01-19 ENCOUNTER — Encounter (HOSPITAL_COMMUNITY): Payer: Self-pay | Admitting: Orthopedic Surgery

## 2021-01-19 DIAGNOSIS — G894 Chronic pain syndrome: Secondary | ICD-10-CM | POA: Diagnosis not present

## 2021-01-19 NOTE — Progress Notes (Signed)
Pt has a temp of 100.9, pt has 5 blankets on and room temp is warm; pt encouraged to do incentive spirometer, room tempt turned down; VS rechecked at 2320 temp went down to 99.7. will continue to monitor pt.

## 2021-01-19 NOTE — Progress Notes (Signed)
Attempted to place patient LLE into the zero degree bone foam, patient refused says it was to painful at this time.

## 2021-01-19 NOTE — Progress Notes (Signed)
Physical Therapy Treatment Patient Details Name: Kirsten Walsh MRN: [id number] DOB: 1961/06/14 Today's Date: 01/19/2021    History of Present Illness Patient is 60 y.o. female s/p Lt TKA on 01/16/21 with PMH significant for HTN, OA, chest pain, low back pain, syncope.    PT Comments    Pt with slow progress.  Limited session due to nausea, lethargy, some lightheadedness (BP stable), and tremors/shaking in standing.  She had very weak quad contraction - so utilized KI.  Pt with low tolerance for flexion and reports CPM not placed recently.   Therapy placed CPM end of session 0 to 45 degrees (to tolerance).  Pt not demonstrating mobility necessary to return home - needs further therapy.     Follow Up Recommendations  Follow surgeon's recommendation for DC plan and follow-up therapies (consider SNF if not progressing)     Equipment Recommendations  None recommended by PT    Recommendations for Other Services       Precautions / Restrictions Precautions Precautions: Fall Required Braces or Orthoses: Knee Immobilizer - Left Knee Immobilizer - Left: Discontinue once straight leg raise with < 10 degree lag Restrictions LLE Weight Bearing: Weight bearing as tolerated    Mobility  Bed Mobility Overal bed mobility: Needs Assistance Bed Mobility: Sit to Supine       Sit to supine: Min assist;HOB elevated   General bed mobility comments: cues for sequence with phsyical assist to manage L LE    Transfers Overall transfer level: Needs assistance Equipment used: Rolling walker (2 wheeled) Transfers: Sit to/from Stand Sit to Stand: Min assist         General transfer comment: Sit to stand: Performed from bed x 3 wth min A to rise, cues for safe hand placement, and increased time.  Utilized KI.  Ambulation/Gait Ambulation/Gait assistance: Min assist Gait Distance (Feet): 15 Feet Assistive device: Rolling walker (2 wheeled) Gait Pattern/deviations: Step-to  pattern;Decreased stride length;Decreased weight shift to left;Decreased dorsiflexion - left Gait velocity: dec   General Gait Details: Pt with very slow gait pattern and standing rest breaks.  Cued for RW proximity and posture.  Limited distance due to shakey and nauseated   Social research officer, government Rankin (Stroke Patients Only)       Balance Overall balance assessment: Needs assistance Sitting-balance support: No upper extremity supported;Feet supported Sitting balance-Leahy Scale: Good     Standing balance support: Bilateral upper extremity supported Standing balance-Leahy Scale: Poor Standing balance comment: requiring RW                            Cognition Arousal/Alertness: Awake/alert Behavior During Therapy: WFL for tasks assessed/performed Overall Cognitive Status: Within Functional Limits for tasks assessed                                        Exercises Total Joint Exercises Ankle Circles/Pumps: AROM;Both;10 reps;Supine Quad Sets: AROM;Both;10 reps;Supine (very light quad contraction on L) Towel Squeeze: AROM;Both;10 reps;Seated Knee Flexion: AAROM;Left;Seated;10 reps (Seated EOB with trash bag under foot to slide and pt hiking hip then relaxing down for knee stretch 5 second hold. Very limited motion due to pain) Goniometric ROM: L knee 0 to 40 degrees    General Comments General comments (skin integrity, edema, etc.): Pt  with shakiness and nausea throughout session.  Pt closing eyes at times with ambultion , with very soft speech, and needing increased cues for sequencing.  Checked BPs: sitting 136/86, standing 121/84, standing 3 mins 127/91      Pertinent Vitals/Pain Pain Assessment: 0-10 Pain Score: 8  Pain Location: Lt knee Pain Descriptors / Indicators: Discomfort;Sore;Throbbing Pain Intervention(s): Limited activity within patient's tolerance;Premedicated before session;Monitored during  session;Repositioned;Ice applied    Home Living                      Prior Function            PT Goals (current goals can now be found in the care plan section) Acute Rehab PT Goals Patient Stated Goal: Regain IND PT Goal Formulation: With patient Time For Goal Achievement: 01/23/21 Progress towards PT goals: Not progressing toward goals - comment (very slow progress, limited by pain/nausea/lightheadedness)    Frequency    7X/week      PT Plan Current plan remains appropriate    Co-evaluation              AM-PAC PT "6 Clicks" Mobility   Outcome Measure  Help needed turning from your back to your side while in a flat bed without using bedrails?: A Little Help needed moving from lying on your back to sitting on the side of a flat bed without using bedrails?: A Little Help needed moving to and from a bed to a chair (including a wheelchair)?: A Little Help needed standing up from a chair using your arms (e.g., wheelchair or bedside chair)?: A Little Help needed to walk in hospital room?: A Lot Help needed climbing 3-5 steps with a railing? : Total 6 Click Score: 15    End of Session Equipment Utilized During Treatment: Gait belt;Left knee immobilizer Activity Tolerance: Other (comment) (limited by lethargy, pain, nausea) Patient left: with call bell/phone within reach;in bed;with bed alarm set Nurse Communication: Mobility status PT Visit Diagnosis: Muscle weakness (generalized) (M62.81);Difficulty in walking, not elsewhere classified (R26.2)     Time: 4259-5638 PT Time Calculation (min) (ACUTE ONLY): 38 min  Charges:  $Gait Training: 8-22 mins $Therapeutic Exercise: 8-22 mins $Therapeutic Activity: 8-22 mins                     Anise Salvo, PT Acute Rehab Services Pager [phone] [facility] [phone]    Rayetta Humphrey 01/19/2021, 6:21 PM

## 2021-01-19 NOTE — Progress Notes (Signed)
   Subjective: 3 Days Post-Op Procedure(s) (LRB): TOTAL KNEE ARTHROPLASTY (Left)  Pt very sleepy this morning and once aroused c/o continued moderate pain in the knee Denies any other symptoms other than pain Currently therapy has been difficult for her Patient reports pain as moderate.  Objective:   VITALS:   Vitals:   01/18/21 2320 01/19/21 0546  BP:  123/66  Pulse:  73  Resp:  16  Temp: 99.7 F (37.6 C) 98.8 F (37.1 C)  SpO2:  100%    Left knee incision healing well Immobilizer in place Nv intact distally No rashes or edema distally Guarded rom  LABS Recent Labs    01/17/21 0308 01/18/21 0324  HGB 12.4 12.4  HCT 37.9 38.3  WBC 7.6 9.0  PLT 190 184    Recent Labs    01/17/21 0308  NA 134*  K 3.1*  BUN 11  CREATININE 0.98  GLUCOSE 149*     Assessment/Plan: 3 Days Post-Op Procedure(s) (LRB): TOTAL KNEE ARTHROPLASTY (Left) Pt still having issues with pain and mobility Will check on her later today after therapy  Plan for d/c either later today or tomorrow Pain management Pulmonary toilet     Brad Antonieta Iba, MPAS [facility] is now [facility]  [place] [address], [address], [place], [place] [address] Phone: [phone] [email or web address] Facebook  [employer]

## 2021-01-19 NOTE — Progress Notes (Signed)
Physical Therapy Treatment Patient Details Name: Kirsten Walsh MRN: [id number] DOB: 05-04-1961 Today's Date: 01/19/2021    History of Present Illness Patient is 60 y.o. female s/p Lt TKA on 01/16/21 with PMH significant for HTN, OA, chest pain, low back pain, syncope.    PT Comments    Pt with slow progress - limited due to pain, nausea, and lightheadedness.  Pt with very weak quad contraction still requiring use of KI.  Became lightheaded with gait and with return to sitting BP 128/68, but question if was lower in standing .  Unable to retest as pt with severe nausea and dry heaving - RN in with meds.  Continue to progress as able.     Follow Up Recommendations  Follow surgeon's recommendation for DC plan and follow-up therapies (may need to consider SNF if not progressing)     Equipment Recommendations  None recommended by PT    Recommendations for Other Services       Precautions / Restrictions Precautions Precautions: Fall Required Braces or Orthoses: Knee Immobilizer - Left Knee Immobilizer - Left: Discontinue once straight leg raise with < 10 degree lag Restrictions Weight Bearing Restrictions: Yes LLE Weight Bearing: Weight bearing as tolerated    Mobility  Bed Mobility Overal bed mobility: Needs Assistance Bed Mobility: Supine to Sit     Supine to sit: Min assist;HOB elevated     General bed mobility comments: cues for sequence with phsyical assist to manage L LE    Transfers Overall transfer level: Needs assistance Equipment used: Rolling walker (2 wheeled) Transfers: Sit to/from [employer] Sit to Stand: Min assist         General transfer comment: Sit to stand: Performed from bed, chair, and BSC with min A to rise, cues for safe hand placement, and increased time.  Utilized KI.  Stand Pivot: from chair to bsc and back to chair - cues for posture and RW used  Ambulation/Gait Ambulation/Gait assistance: Min assist;+2  safety/equipment Gait Distance (Feet): 44 Feet Assistive device: Rolling walker (2 wheeled) Gait Pattern/deviations: Step-to pattern;Decreased stride length;Decreased weight shift to left;Decreased dorsiflexion - left Gait velocity: dec   General Gait Details: Pt with very slow gait pattern and standing rest breaks.  Cued for RW proximity and posture.  Pt had c/o lighheadedness and required cues to return to sitting.  Had chair follow.   Stairs Stairs:  (unsafe to attempt - lightheaded)           Wheelchair Mobility    Modified Rankin (Stroke Patients Only)       Balance Overall balance assessment: Needs assistance Sitting-balance support: No upper extremity supported;Feet supported Sitting balance-Leahy Scale: Good     Standing balance support: Bilateral upper extremity supported Standing balance-Leahy Scale: Poor Standing balance comment: requiring RW                            Cognition Arousal/Alertness: Awake/alert Behavior During Therapy: WFL for tasks assessed/performed Overall Cognitive Status: Within Functional Limits for tasks assessed                                        Exercises Total Joint Exercises Ankle Circles/Pumps: AROM;Both;10 reps;Supine Quad Sets: AROM;Both;10 reps;Supine (very light quad contraction on L) Heel Slides:  (Only 1 rep - too painful) Knee Flexion: AAROM;Left;5 reps;Seated (Seated EOB with trash  bag under foot to slide and pt hiking hip then relaxing down for knee stretch 5 second hold.  Very limited motion due to pain) Goniometric ROM: L knee 5 to 30 degrees; limited by pain    General Comments General comments (skin integrity, edema, etc.): Pt had c/o lightheadedness with gait and with increased lethargic appearance, had return to seated position and by time checked BP was 128/68 and symptoms improving.   Upon return to room, pt with c/o nausea and began significant dry heaving for several mins.   Notified RN who brought in medication.      Pertinent Vitals/Pain Pain Assessment: 0-10 Pain Score: 10-Worst pain ever Pain Location: Lt knee Pain Descriptors / Indicators: Discomfort;Sore;Throbbing Pain Intervention(s): Limited activity within patient's tolerance;Monitored during session;Premedicated before session;Ice applied    Home Living                      Prior Function            PT Goals (current goals can now be found in the care plan section) Acute Rehab PT Goals Patient Stated Goal: Regain IND PT Goal Formulation: With patient Time For Goal Achievement: 01/23/21 Progress towards PT goals: Progressing toward goals    Frequency    7X/week      PT Plan Current plan remains appropriate    Co-evaluation              AM-PAC PT "6 Clicks" Mobility   Outcome Measure  Help needed turning from your back to your side while in a flat bed without using bedrails?: A Little Help needed moving from lying on your back to sitting on the side of a flat bed without using bedrails?: A Little Help needed moving to and from a bed to a chair (including a wheelchair)?: A Little Help needed standing up from a chair using your arms (e.g., wheelchair or bedside chair)?: A Little Help needed to walk in hospital room?: A Little Help needed climbing 3-5 steps with a railing? : Total 6 Click Score: 16    End of Session Equipment Utilized During Treatment: Gait belt;Left knee immobilizer Activity Tolerance: Other (comment) (LImited by pain and nausea) Patient left: with chair alarm set;in chair;with call bell/phone within reach (ice pack placed)   PT Visit Diagnosis: Muscle weakness (generalized) (M62.81);Difficulty in walking, not elsewhere classified (R26.2)     Time: 0932-3557 PT Time Calculation (min) (ACUTE ONLY): 61 min  Charges:  $Gait Training: 23-37 mins $Therapeutic Exercise: 8-22 mins $Therapeutic Activity: 8-22 mins                     Anise Salvo,  PT Acute Rehab Services Pager [phone] [facility] [phone]    Rayetta Humphrey 01/19/2021, 12:44 PM

## 2021-01-20 NOTE — Progress Notes (Signed)
Orthopedics Progress Note  Subjective: Patient complains of severe left knee pain. She is concerned about home safety and support at home  Objective:  Vitals:   01/19/21 2106 01/20/21 0549  BP: 126/76 114/71  Pulse: 86 71  Resp: 16 17  Temp: 99.8 F (37.7 C) 99.2 F (37.3 C)  SpO2: 100% 100%    General: Awake and alert  Musculoskeletal: Left knee 0-80 degrees AROM and AAROM. Compartments supple. No pain with ankle pumps.  Dressing CDI.  Neurovascularly intact  Lab Results  Component Value Date   WBC 9.0 01/18/2021   HGB 12.4 01/18/2021   HCT 38.3 01/18/2021   MCV 93.2 01/18/2021   PLT 184 01/18/2021       Component Value Date/Time   NA 134 (L) 01/17/2021 0308   NA 140 02/18/2020 1447   K 3.1 (L) 01/17/2021 0308   CL 102 01/17/2021 0308   CO2 25 01/17/2021 0308   GLUCOSE 149 (H) 01/17/2021 0308   BUN 11 01/17/2021 0308   BUN 16 02/18/2020 1447   CREATININE 0.98 01/17/2021 0308   CREATININE 1.00 08/14/2020 1050   CALCIUM 8.7 (L) 01/17/2021 0308   GFRNONAA >60 01/17/2021 0308   GFRNONAA 62 08/14/2020 1050   GFRAA 72 08/14/2020 1050    Lab Results  Component Value Date   INR 1.01 10/27/2009   INR 0.93 08/27/2009    Assessment/Plan: POD #4 s/p Procedure(s): TOTAL KNEE ARTHROPLASTY Stable but slow to recover. Therapy and discharge planning. Plan to go home tomorrow after therapy works with the family on home safety and stairs.   Almedia Balls. Ranell Patrick, MD 01/20/2021 5:06 PM

## 2021-01-20 NOTE — Progress Notes (Signed)
Physical Therapy Treatment Patient Details Name: Kirsten Walsh MRN: [id number] DOB: [birth year] Today's Date: 01/20/2021    History of Present Illness Patient is 60 y.o. female s/p Lt TKA on 01/16/21 with PMH significant for HTN, OA, chest pain, low back pain, syncope.    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 Asked who was going to help her at home she was vary "vague".  She did share she lives in a split level but has a hospital bed set up in the living room.  She has 2 steps to enter home "my boys said they can carry me but I don't want them to do that".  Pt has 4 children ranging in age from 52 to 34.   Assisted OOB.  General bed mobility comments: demonstarted and instructed pt how to use belt to self assist LE off bed which she perfomred with increased time. General transfer comment: slow and sluggish.  Increased time to push off and complete turns.  25% VC's to extend LE to decrease pain. General Gait Details: Very slow sluggish gait.  25% VC's on proper walker to self distance and safety with turns.  Distance limited by fatigue after performing stair training.General stair comments: practiced up 2 steps backward due to no rails at 75% VC's on proper walker placement, proper sequencing as well as safety.  Pt "sluggish" slow and delayed.  Will need to practice stairs when family is present. Pt was unable to give a time family would be in today.   Pt stated she was NOT ready to go home today because of increased pain after using BSC "my leg fell off the trash can" earlier.  But later said it happened yesterday.  "I just want to make sure I didn't hurt anything".  Pt insistent she speak to Dr Ranell Patrick.   Will see pt again this afternoon as she has an order for Discharge today.     Follow Up Recommendations  Follow surgeon's recommendation for DC plan and follow-up therapies      Equipment Recommendations  None recommended by PT    Recommendations for Other Services       Precautions / Restrictions Precautions Precautions: Fall Precaution Comments: instructed no pillow undewr knee Restrictions Weight Bearing Restrictions: No LLE Weight Bearing: Weight bearing as tolerated    Mobility  Bed Mobility Overal bed mobility: Needs Assistance Bed Mobility: Supine to Sit     Supine to sit: Supervision;Min guard     General bed mobility comments: demonstarted and instructed pt how to use belt to self assist LE off bed which she perfomred with increased time.    Transfers Overall transfer level: Needs assistance Equipment used: Rolling walker (2 wheeled) Transfers: Sit to/from [employer] Sit to Stand: Supervision Stand pivot transfers: Supervision;Min guard       General transfer comment: slow and sluggish.  Increased time to push off and complete turns.  25% VC's to extend LE to decrease pain.  Ambulation/Gait Ambulation/Gait assistance: Supervision;Min guard Gait Distance (Feet): 12 Feet Assistive device: Rolling walker (2 wheeled) Gait Pattern/deviations: Step-to pattern;Decreased stride length;Decreased weight shift to left;Decreased dorsiflexion - left Gait velocity: decreased   General Gait Details: Very slow sluggish gait.  25% VC's on proper walker to self distance and safety with turns.  Distance limited by fatigue after performing stair training.   Stairs Stairs: Yes Stairs assistance:  Mod assist Stair Management: No rails;Backwards;Step to pattern;With walker Number of Stairs: 2 General stair comments: practiced up 2 steps backward due to no rails at 75% VC's on proper walker placement, proper sequencing as well as safety.  Pt "sluggish" slow and delayed.  Will need to practice stairs when family is present.   Wheelchair Mobility    Modified Rankin (Stroke Patients Only)       Balance                                             Cognition Arousal/Alertness: Awake/alert Behavior During Therapy: WFL for tasks assessed/performed Overall Cognitive Status: Within Functional Limits for tasks assessed                                 General Comments: Pt appears AxO x 3 Substance Abuse Child psychotherapist, pleasant but at times "foggy" and "inconsistant".      Exercises      General Comments        Pertinent Vitals/Pain Pain Assessment: 0-10 Pain Score: 9  Pain Location: Lt knee Pain Descriptors / Indicators: Discomfort;Sore;Throbbing;Operative site guarding Pain Intervention(s): Monitored during session;Premedicated before session;Repositioned;Ice applied    Home Living                      Prior Function            PT Goals (current goals can now be found in the care plan section) Progress towards PT goals: Progressing toward goals    Frequency    7X/week      PT Plan Current plan remains appropriate    Co-evaluation              AM-PAC PT "6 Clicks" Mobility   Outcome Measure  Help needed turning from your back to your side while in a flat bed without using bedrails?: A Little Help needed moving from lying on your back to sitting on the side of a flat bed without using bedrails?: A Little Help needed moving to and from a bed to a chair (including a wheelchair)?: A Little Help needed standing up from a chair using your arms (e.g., wheelchair or bedside chair)?: A Little Help needed to walk in hospital room?: A Lot Help needed climbing 3-5 steps with a railing? : Total 6 Click Score: 15    End of Session Equipment Utilized During Treatment: Gait belt Activity Tolerance: Patient limited by fatigue;No increased pain Patient left: in chair;with chair alarm set;with call bell/phone within reach Nurse Communication: Mobility status PT Visit Diagnosis: Muscle weakness (generalized) (M62.81);Difficulty in walking, not elsewhere  classified (R26.2)     Time: 1100-1139 PT Time Calculation (min) (ACUTE ONLY): 39 min  Charges:  $Gait Training: 23-37 mins $Therapeutic Activity: 8-22 mins                     Felecia Shelling  PTA Acute  Rehabilitation Services Pager      [phone] Office      [phone]

## 2021-01-20 NOTE — Progress Notes (Signed)
   Subjective: 4 Days Post-Op Procedure(s) (LRB): TOTAL KNEE ARTHROPLASTY (Left)  Pt feeling slightly better today but still c/o moderate pain Denies any new symptoms or issues Plan for d/c after therapy today Patient reports pain as moderate.  Objective:   VITALS:   Vitals:   01/19/21 2106 01/20/21 0549  BP: 126/76 114/71  Pulse: 86 71  Resp: 16 17  Temp: 99.8 F (37.7 C) 99.2 F (37.3 C)  SpO2: 100% 100%    Left knee: incision healing well Dressing in place No rashes or edema distally Guarded rom especially flexion Antalgic gait  LABS Recent Labs    01/18/21 0324  HGB 12.4  HCT 38.3  WBC 9.0  PLT 184    No results for input(s): NA, K, BUN, CREATININE, GLUCOSE in the last 72 hours.   Assessment/Plan: 4 Days Post-Op Procedure(s) (LRB): TOTAL KNEE ARTHROPLASTY (Left) D/c home today after therapy F/u in 2 weeks in the office Pain management Pt in agreement   Brad Kripa Foskey PA-C, MPAS [facility] is now [facility] [address], [place], [place] [address] Phone: [phone] [email or web address] Facebook  [employer]

## 2021-01-20 NOTE — Progress Notes (Signed)
Physical Therapy Treatment Patient Details Name: Kirsten Walsh MRN: [id number] DOB: November 20, 1960 Today's Date: 01/20/2021    History of Present Illness Patient is 60 y.o. female s/p Lt TKA on 01/16/21 with PMH significant for HTN, OA, chest pain, low back pain, syncope.    PT Comments    POD # 4 pm session Assisted OOB.  General bed mobility comments: demonstarted and instructed pt how to use belt to self assist LE off bed which she perfomred with increased time. Assisted with amb in hallway.  General Gait Details: Very slow sluggish gait.  25% VC's on proper walker to self distance and safety with turns.  Distance limited by fatigue and tremors.  Recliner brought to patient. Returned to room to perform TE's when Dr Ranell Patrick arrived.  He assessed pt's knee flex (close to 90 degrees) and extension (close to zero degrees)  Instructed her about "dangling" while seated on a high surface and some TE's.  Swelling and tightness "is normal".   Pt agreed to contact family to have someone here in the morning for stair training and also be prepared to D/C to home.   Follow Up Recommendations  Follow surgeon's recommendation for DC plan and follow-up therapies     Equipment Recommendations  None recommended by PT    Recommendations for Other Services       Precautions / Restrictions Precautions Precautions: Fall Precaution Comments: instructed no pillow undewr knee Restrictions Weight Bearing Restrictions: No LLE Weight Bearing: Weight bearing as tolerated    Mobility  Bed Mobility Overal bed mobility: Needs Assistance Bed Mobility: Supine to Sit     Supine to sit: Supervision     General bed mobility comments: demonstarted and instructed pt how to use belt to self assist LE off bed which she perfomred with increased time.    Transfers Overall transfer level: Needs assistance Equipment used: Rolling walker (2 wheeled) Transfers: Sit to/from [employer] Sit to  Stand: Supervision Stand pivot transfers: Supervision;Min guard       General transfer comment: slow and sluggish.  Increased time to push off and complete turns.  25% VC's to extend LE to decrease pain.  Ambulation/Gait Ambulation/Gait assistance: Supervision;Min guard Gait Distance (Feet): 14 Feet Assistive device: Rolling walker (2 wheeled) Gait Pattern/deviations: Step-to pattern;Decreased stride length;Decreased weight shift to left;Decreased dorsiflexion - left Gait velocity: decreased   General Gait Details: Very slow sluggish gait.  25% VC's on proper walker to self distance and safety with turns.  Distance limited by fatigue and tremors.  Recliner brought to patient.   Stairs Stairs: Yes Stairs assistance: Mod assist Stair Management: No rails;Backwards;Step to pattern;With walker Number of Stairs: 2 General stair comments: practiced up 2 steps backward due to no rails at 75% VC's on proper walker placement, proper sequencing as well as safety.  Pt "sluggish" slow and delayed.  Will need to practice stairs when family is present.   Wheelchair Mobility    Modified Rankin (Stroke Patients Only)       Balance                                            Cognition Arousal/Alertness: Awake/alert Behavior During Therapy: WFL for tasks assessed/performed Overall Cognitive Status: Within Functional Limits for tasks assessed  General Comments: Pt appears AxO x 3 Substance Abuse Child psychotherapist, pleasant but at times "foggy" and "inconsistant".      Exercises  Total Knee Replacement TE's following HEP handout 10 reps B LE ankle pumps 05 reps towel squeezes 05 reps knee presses 05 reps heel slides  05 reps SLR's 05 reps ABD Educated on use of gait belt to assist with TE's Followed by ICE     General Comments        Pertinent Vitals/Pain Pain Assessment: 0-10 Pain Score: 10-Worst pain ever Pain  Location: Lt knee Pain Descriptors / Indicators: Discomfort;Sore;Throbbing;Operative site guarding Pain Intervention(s): Monitored during session;Premedicated before session;Repositioned;Ice applied    Home Living                      Prior Function            PT Goals (current goals can now be found in the care plan section) Progress towards PT goals: Progressing toward goals    Frequency    7X/week      PT Plan Current plan remains appropriate    Co-evaluation              AM-PAC PT "6 Clicks" Mobility   Outcome Measure  Help needed turning from your back to your side while in a flat bed without using bedrails?: A Little Help needed moving from lying on your back to sitting on the side of a flat bed without using bedrails?: A Little Help needed moving to and from a bed to a chair (including a wheelchair)?: A Little Help needed standing up from a chair using your arms (e.g., wheelchair or bedside chair)?: A Little Help needed to walk in hospital room?: A Lot Help needed climbing 3-5 steps with a railing? : Total 6 Click Score: 15    End of Session Equipment Utilized During Treatment: Gait belt Activity Tolerance: Patient limited by fatigue;No increased pain Patient left: in chair;with chair alarm set;with call bell/phone within reach Nurse Communication: Mobility status PT Visit Diagnosis: Muscle weakness (generalized) (M62.81);Difficulty in walking, not elsewhere classified (R26.2)     Time: 1640-1710 PT Time Calculation (min) (ACUTE ONLY): 30 min  Charges:  $Gait Training: 8-22 mins $Therapeutic Exercise: 8-22 mins                     Felecia Shelling  PTA Acute  Rehabilitation Services Pager      [phone] Office      [phone]

## 2021-01-20 NOTE — Discharge Summary (Signed)
In most cases prophylactic antibiotics for Dental procdeures after total joint surgery are not necessary.  Exceptions are as follows:  1. History of prior total joint infection  2. Severely immunocompromised (Organ Transplant, cancer chemotherapy, Rheumatoid biologic meds such as Humera)  3. Poorly controlled diabetes (A1C &gt; 8.0, blood glucose over 200)  If you have one of these conditions, contact your surgeon for an antibiotic prescription, prior to your dental procedure. Orthopedic Discharge Summary        Physician Discharge Summary  Patient ID: Kirsten Walsh MRN: [id number] DOB/AGE: 10-19-1960 60 y.o.  Admit date: 01/16/2021 Discharge date: 01/20/2021   Procedures:  Procedure(s) (LRB): TOTAL KNEE ARTHROPLASTY (Left)  Attending Physician:  Dr. Malon Kindle  Admission Diagnoses:  left knee end stage osteoarthritis  Discharge Diagnoses:  left knee end stage osteoarthritis   Past Medical History:  Diagnosis Date   Arthritis    Chest pain    Chronic lower back pain    "right side to mid back" (07/06/2017)   Family history of adverse reaction to anesthesia    "daughter did; not sure happened"   Gall stones    History of anaphylaxis 06/26/2014   History of kidney stones    Hypertension    Migraine    "qd" (07/06/2017)   Syncope and collapse 07/06/2017    sitting on the commode; developed nausea and passed out; hit head on shower and suffered a laceration; woke up in "a pool of blood"    PCP: Derrel Nip, MD   Discharged Condition: fair  Hospital Course:  Patient underwent the above stated procedure on 01/16/2021. Patient tolerated the procedure well and brought to the recovery room in good condition and subsequently to the floor. Patient had an uncomplicated hospital course and was stable for discharge. C/o moderate pain with therapy and movement requiring an extra day of hospital stay   Disposition: Discharge disposition: 01-Home or Self  Care      with follow up in 2 weeks    Follow-up Information     Beverely Low, MD. Call in 2 week(s).   Specialty: Orthopedic Surgery Why: call [phone] for appt in two weeks in the office Contact information: [address] [place] [address] [phone]                 Dental Antibiotics:  In most cases prophylactic antibiotics for Dental procdeures after total joint surgery are not necessary.  Exceptions are as follows:  1. History of prior total joint infection  2. Severely immunocompromised (Organ Transplant, cancer chemotherapy, Rheumatoid biologic meds such as Humera)  3. Poorly controlled diabetes (A1C &gt; 8.0, blood glucose over 200)  If you have one of these conditions, contact your surgeon for an antibiotic prescription, prior to your dental procedure.  Discharge Instructions     Call MD / Call 911   Complete by: As directed    If you experience chest pain or shortness of breath, CALL 911 and be transported to the hospital emergency room.  If you develope a fever above 101 F, pus (white drainage) or increased drainage or redness at the wound, or calf pain, call your surgeon's office.   Constipation Prevention   Complete by: As directed    Drink plenty of fluids.  Prune juice may be helpful.  You may use a stool softener, such as Colace (over the counter) 100 mg twice a day.  Use MiraLax (over the counter) for constipation as needed.  Diet - low sodium heart healthy   Complete by: As directed    Increase activity slowly as tolerated   Complete by: As directed    Post-operative opioid taper instructions:   Complete by: As directed    POST-OPERATIVE OPIOID TAPER INSTRUCTIONS: It is important to wean off of your opioid medication as soon as possible. If you do not need pain medication after your surgery it is ok to stop day one. Opioids include: Codeine, Hydrocodone(Norco, Vicodin), Oxycodone(Percocet, oxycontin) and  hydromorphone amongst others.  Long term and even short term use of opiods can cause: Increased pain response Dependence Constipation Depression Respiratory depression And more.  Withdrawal symptoms can include Flu like symptoms Nausea, vomiting And more Techniques to manage these symptoms Hydrate well Eat regular healthy meals Stay active Use relaxation techniques(deep breathing, meditating, yoga) Do Not substitute Alcohol to help with tapering If you have been on opioids for less than two weeks and do not have pain than it is ok to stop all together.  Plan to wean off of opioids This plan should start within one week post op of your joint replacement. Maintain the same interval or time between taking each dose and first decrease the dose.  Cut the total daily intake of opioids by one tablet each day Next start to increase the time between doses. The last dose that should be eliminated is the evening dose.          Allergies as of 01/20/2021       Reactions   Apple Anaphylaxis, Swelling   Aspirin Anaphylaxis, Swelling   Daucus Carota Anaphylaxis, Swelling   Haloperidol And Related Anaphylaxis, Swelling, Other (See Comments)   Lock jaw and slurred speech        Medication List     TAKE these medications    amitriptyline 25 MG tablet Commonly known as: ELAVIL Take 25 mg by mouth at bedtime as needed for sleep.   Butalbital-APAP-Caffeine 50-325-40 MG capsule Take 1 capsule by mouth every 6 (six) hours as needed for headache (migraine).   cetirizine 10 MG tablet Commonly known as: ZYRTEC Take 10 mg by mouth daily as needed for allergies.   cloNIDine 0.1 MG tablet Commonly known as: CATAPRES TAKE 1 TABLET(0.1 MG) BY MOUTH TWICE DAILY What changed: See the new instructions.   Diclofenac Sodium 3 % Gel Apply 1 application topically 2 (two) times daily as needed (pain).   EPINEPHrine 0.3 mg/0.3 mL Soaj injection Commonly known as: EPI-PEN Inject 0.3 mg into  the muscle as needed for anaphylaxis.   fluticasone 50 MCG/ACT nasal spray Commonly known as: FLONASE Place 1 spray into both nostrils daily as needed for allergies or rhinitis.   gabapentin 300 MG capsule Commonly known as: NEURONTIN Take 300 mg by mouth 2 (two) times daily.   hydrochlorothiazide 25 MG tablet Commonly known as: HYDRODIURIL TAKE 1 TABLET(25 MG) BY MOUTH DAILY What changed: See the new instructions.   HYDROmorphone 2 MG tablet Commonly known as: Dilaudid Take 1 tablet (2 mg total) by mouth every 4 (four) hours as needed for severe pain.   methocarbamol 500 MG tablet Commonly known as: Robaxin Take 1 tablet (500 mg total) by mouth every 6 (six) hours as needed.   ondansetron 4 MG tablet Commonly known as: ZOFRAN Take 4 mg by mouth 3 (three) times daily as needed for nausea or vomiting. What changed: Another medication with the same name was added. Make sure you understand how and when to take each.  ondansetron 4 MG tablet Commonly known as: Zofran Take 1 tablet (4 mg total) by mouth daily as needed for nausea or vomiting. What changed: You were already taking a medication with the same name, and this prescription was added. Make sure you understand how and when to take each.   oxyCODONE 15 MG immediate release tablet Commonly known as: ROXICODONE Take 15 mg by mouth every 12 (twelve) hours.   pantoprazole 40 MG tablet Commonly known as: PROTONIX Take 40 mg by mouth in the morning.   Phendimetrazine Tartrate 105 MG Cp24 Take 105 mg by mouth every morning. Last dose approx 12/22/20 per pt   polyethylene glycol 17 g packet Commonly known as: MIRALAX / GLYCOLAX Take 17 g by mouth daily.   potassium chloride 10 MEQ tablet Commonly known as: KLOR-CON Take 2 tablets (20 mEq total) by mouth daily.   promethazine 25 MG tablet Commonly known as: PHENERGAN Take 1 tablet (25 mg total) by mouth every 6 (six) hours as needed for nausea or vomiting.    promethazine 25 MG suppository Commonly known as: PHENERGAN Place 1 suppository (25 mg total) rectally every 8 (eight) hours as needed for up to 6 doses for nausea or vomiting.   SUMAtriptan 100 MG tablet Commonly known as: IMITREX Take 100 mg by mouth every 2 (two) hours as needed for migraine. May repeat in 2 hours if headache persists or recurs.   topiramate 100 MG tablet Commonly known as: TOPAMAX TAKE 1 AND 1/2 TABLETS(150 MG) BY MOUTH TWICE DAILY What changed: See the new instructions.   traZODone 50 MG tablet Commonly known as: DESYREL Take 50 mg by mouth at bedtime.   Vitamin D (Ergocalciferol) 1.25 MG (50000 UNIT) Caps capsule Commonly known as: DRISDOL Take 50,000 Units by mouth every Monday.          Signed: Thea Gist 01/20/2021, 7:33 AM  [facility] is now [facility] [address], [address], [place], [place] [address] Phone: [phone] Facebook  Instagram  [employer]

## 2021-01-20 NOTE — Progress Notes (Signed)
PHYSICAL THERAPY  Pm session.  Pt back in bed easily aroused.  Sleepy.  Room dark.  I asked her if family has been here or coming so we can repeat stair training with them.  "I told them not to come today", stated pt.  Pt continues to be "worried" about her knee pain and repeatably requests to "speak to my doctor".  Reported to RN.  Pt stated she will not have adequate help at home until "tomorrow when my nurse comes".    Pt's mobility is currently functional enough to D/C to home today.  She is getting in/out bed as well as using [facility] (with staff).  She is amb a functional household distance. Only barrier is stair training to teach  family how to safely assist pt up her 2 steps which have NO rails.    Felecia Shelling  PTA Acute  Rehabilitation Services Pager      [phone] Office      [phone]

## 2021-01-21 DIAGNOSIS — G894 Chronic pain syndrome: Secondary | ICD-10-CM | POA: Diagnosis not present

## 2021-01-21 DIAGNOSIS — M179 Osteoarthritis of knee, unspecified: Secondary | ICD-10-CM | POA: Diagnosis not present

## 2021-01-21 DIAGNOSIS — I10 Essential (primary) hypertension: Secondary | ICD-10-CM | POA: Diagnosis not present

## 2021-01-21 MED ORDER — APIXABAN 2.5 MG PO TABS: 2.5000 mg | ORAL_TABLET | Freq: Two times a day (BID) | ORAL | 0 refills | Status: DC

## 2021-01-21 NOTE — Plan of Care (Signed)

## 2021-01-21 NOTE — Progress Notes (Signed)
PHYSICAL THERAPY  Was told by RN family would be here between between 0:30 and 11:00 am.  I stopped in her room twice (10:40/11:05) no family.  At 11:05 Pt stated her daughter is coming and that she worked 3rd shift last night and was sleeping.  Pt asked "why does she have to be here?".  Reminded pt discussion yesterday need for family present to perform stair education as pt has 2 steps with NO rails to enter her home.   Asked RN to page me when daughter arrives.  Felecia Shelling  PTA Acute  Rehabilitation Services Pager      [phone] Office      [phone]

## 2021-01-21 NOTE — Progress Notes (Signed)
PHYSICAL THERAPY  Received page at 11:45 that Daughter was here Reported to room at 11:55 "I called for you a while ago" Daughter already left.  pt stated "to go get my car.  I can't get in her car".  Will attempt to see later when daughter returns.  Felecia Shelling  PTA Acute  Rehabilitation Services Pager      [phone] Office      [phone]

## 2021-01-21 NOTE — Progress Notes (Signed)
Physical Therapy Treatment Patient Details Name: Kirsten Walsh MRN: [id number] DOB: 27-May-1961 Today's Date: 01/21/2021    History of Present Illness Patient is 60 y.o. female s/p Lt TKA on 01/16/21 with PMH significant for HTN, OA, chest pain, low back pain, syncope.    PT Comments    POD # 5 Daughter present during session.  Rolled pt to [facility] to practice stairs with daughter.  General transfer comment: increased time with good use B UE's to control and steady self.  General Gait Details: slow gait with shuffled steps.  C/o 7/10 knee pain.  Daughter present and "hands on" instructed on safe mobility/use of walker.  "it hurts"  limited distance due to pain and focus os session to practice stairs. General stair comments: with daughter "hands on" instructed and assisted pt up and down 3 steps with walker due to NO rails at her home.  Pt slow but tolerated well. Addressed all mobility questions, discussed appropriate activity, educated on use of ICE.  Pt ready for D/C to home. Pt has hired a Engineer, maintenance to help her at home.  She also have 4 grown children but "they work"    Follow Up Recommendations  Follow surgeon's recommendation for DC plan and follow-up therapies     Equipment Recommendations  None recommended by PT    Recommendations for Other Services       Precautions / Restrictions Precautions Precautions: Fall Precaution Comments: instructed no pillow undewr knee Restrictions Weight Bearing Restrictions: No LLE Weight Bearing: Weight bearing as tolerated    Mobility  Bed Mobility               General bed mobility comments: OOB in recliner    Transfers Overall transfer level: Needs assistance Equipment used: Rolling walker (2 wheeled) Transfers: Sit to/from [employer] Sit to Stand: Supervision Stand pivot transfers: Supervision       General transfer comment: increased time with good use B UE's to control and steady  self  Ambulation/Gait Ambulation/Gait assistance: Supervision Gait Distance (Feet): 22 Feet (11 feet x 2 to and from stairs) Assistive device: Rolling walker (2 wheeled) Gait Pattern/deviations: Step-to pattern;Decreased stride length;Decreased weight shift to left;Decreased dorsiflexion - left Gait velocity: decreased   General Gait Details: slow gait with shuffled steps.  C/o 7/10 knee pain.  Daughter present and "hands on" instructed on safe mobility/use of walker.  "it hurts"  limited distance due to pain and focus os session to practice stairs.   Stairs Stairs: Yes Stairs assistance: Min assist Stair Management: No rails;Backwards;Step to pattern;With walker Number of Stairs: 3 General stair comments: with daughter "hands on" instructed and assisted pt up and down 3 steps with walker due to NO rails at her home.  Pt slow but tolerated well.   Wheelchair Mobility    Modified Rankin (Stroke Patients Only)       Balance                                            Cognition Arousal/Alertness: Awake/alert Behavior During Therapy: WFL for tasks assessed/performed Overall Cognitive Status: Within Functional Limits for tasks assessed                                 General Comments: Pt appears AxO x 3 Substance Abuse  Social Worker, well educated      Exercises      General Comments        Pertinent Vitals/Pain Pain Score: 6  Pain Location: Lt knee Pain Descriptors / Indicators: Discomfort;Sore;Throbbing;Operative site guarding Pain Intervention(s): Monitored during session;Premedicated before session;Repositioned;Ice applied    Home Living                      Prior Function            PT Goals (current goals can now be found in the care plan section) Progress towards PT goals: Progressing toward goals    Frequency    7X/week      PT Plan Current plan remains appropriate    Co-evaluation               AM-PAC PT "6 Clicks" Mobility   Outcome Measure  Help needed turning from your back to your side while in a flat bed without using bedrails?: A Little Help needed moving from lying on your back to sitting on the side of a flat bed without using bedrails?: A Little Help needed moving to and from a bed to a chair (including a wheelchair)?: A Little Help needed standing up from a chair using your arms (e.g., wheelchair or bedside chair)?: A Little Help needed to walk in hospital room?: A Little Help needed climbing 3-5 steps with a railing? : A Little 6 Click Score: 18    End of Session Equipment Utilized During Treatment: Gait belt Activity Tolerance: No increased pain Patient left: in chair;with chair alarm set;with call bell/phone within reach Nurse Communication: Mobility status (pt ready for D/C to home) PT Visit Diagnosis: Muscle weakness (generalized) (M62.81);Difficulty in walking, not elsewhere classified (R26.2)     Time: 4650-3546 PT Time Calculation (min) (ACUTE ONLY): 29 min  Charges:  $Gait Training: 8-22 mins $Therapeutic Activity: 8-22 mins                    Felecia Shelling  PTA Acute  Rehabilitation Services Pager      [phone] Office      [phone]

## 2021-01-21 NOTE — Progress Notes (Signed)
Provided discharge education/instructions, all questions and concerns addressed, Pt not in distress, discharged home with belongings accompanied by daughter. 

## 2021-01-21 NOTE — Progress Notes (Signed)
   01/21/21 0919  Assess: MEWS Score  BP 121/88  Pulse Rate (!) 124  SpO2 99 %  O2 Device Room Air  Assess: MEWS Score  MEWS Temp 0  MEWS Systolic 0  MEWS Pulse 2  MEWS RR 0  MEWS LOC 0  MEWS Score 2  MEWS Score Color Yellow  Assess: if the MEWS score is Yellow or Red  Were vital signs taken at a resting state? No  Focused Assessment No change from prior assessment  Does the patient meet 2 or more of the SIRS criteria? No  MEWS guidelines implemented *See Row Information* No, vital signs rechecked  Treat  MEWS Interventions Other (Comment) (Pt was ambulating to [facility] when heart rate was taken and recorded)  Pain Scale 0-10  Pain Score 10  Pain Type Surgical pain  Pain Location Knee  Pain Orientation Left  Pain Descriptors / Indicators Aching  Pain Frequency Intermittent  Pain Onset With Activity  Pain Intervention(s) Repositioned  Notify: Charge Nurse/RN  Name of Charge Nurse/RN Notified JT, RN  Date Charge Nurse/RN Notified 01/21/21  Time Charge Nurse/RN Notified 1019  Notify: Provider  Provider Name/Title n/a  Document  Patient Outcome Other (Comment) (Pt assisted to chair and vital signs rechecked)  Assess: SIRS CRITERIA  SIRS Temperature  0  SIRS Pulse 1  SIRS Respirations  0  SIRS WBC 0  SIRS Score Sum  1   HR was taken while Pt was ambulating to [facility] from the bed. Pt complained of nausea and severe pain 10/10, diaphoretic, no chest pain. Pt then assisted to chair. Pt started to feel a little better. Vital signs rechecked, not in acute distress. RN will continue to monitor.

## 2021-01-21 NOTE — Discharge Summary (Signed)
In most cases prophylactic antibiotics for Dental procdeures after total joint surgery are not necessary.  Exceptions are as follows:  1. History of prior total joint infection  2. Severely immunocompromised (Organ Transplant, cancer chemotherapy, Rheumatoid biologic meds such as [place])  3. Poorly controlled diabetes (A1C &gt; 8.0, blood glucose over 200)  If you have one of these conditions, contact your surgeon for an antibiotic prescription, prior to your dental procedure. Orthopedic Discharge Summary        Physician Discharge Summary  Patient ID: Kirsten Walsh MRN: [id number] DOB/AGE: 01-15-1961 60 y.o.  Admit date: 01/16/2021 Discharge date: 01/21/2021   Procedures:  Procedure(s) (LRB): TOTAL KNEE ARTHROPLASTY (Left)  Attending Physician:  Dr. 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 Patient tolerated the procedure well and brought to the recovery room in good condition and subsequently to the floor. Patient had an uncomplicated hospital course and was stable for discharge after therapy goals were met and pain was under adequate control. Plan is for Eliquis for one month for DVT prophylaxis and stockings and mobilization. Outpatient therapy. Follow up in  two weeks in the office. Call our office for questions   Disposition: Discharge disposition: 01-Home or Self Care      with follow up in 2 weeks    Follow-up Information     Netta Cedars, MD. Call in 2 week(s).   Specialty: Orthopedic Surgery Why: call [phone] for appt in two weeks in the office Contact information: [address] [place] [address] [phone]                 Dental Antibiotics:  In most cases prophylactic antibiotics for Dental procdeures after total joint surgery are not necessary.  Exceptions are as follows:  1. History of prior total joint infection  2. Severely immunocompromised (Organ Transplant, cancer chemotherapy, Rheumatoid biologic meds such as [place])  3. Poorly controlled diabetes (A1C &gt; 8.0, blood glucose over 200)  If you have one of these conditions, contact your surgeon for an antibiotic prescription, prior to your dental procedure.  Discharge Instructions     Call MD / Call 911   Complete by: As directed    If you experience chest pain or shortness of breath, CALL 911 and be transported to the hospital emergency room.  If you develope a fever above 101 F, pus (white drainage) or increased drainage or redness at the wound, or calf pain, call your surgeon's office.   Call MD / Call 911   Complete by: As directed    If you experience chest pain or shortness of breath, CALL 911 and be transported  to the hospital emergency room.  If you develope a fever above 101 F, pus (white drainage) or increased drainage or redness at the wound, or calf pain, call your surgeon's office.   Constipation Prevention   Complete by: As directed    Drink plenty of fluids.  Prune juice may be helpful.  You may use a stool softener, such as Colace (over the counter) 100 mg twice a day.  Use MiraLax (over the counter) for constipation as needed.   Constipation Prevention   Complete by: As directed    Drink plenty of  fluids.  Prune juice may be helpful.  You may use a stool softener, such as Colace (over the counter) 100 mg twice a day.  Use MiraLax (over the counter) for constipation as needed.   Diet - low sodium heart healthy   Complete by: As directed    Diet - low sodium heart healthy   Complete by: As directed    Increase activity slowly as tolerated   Complete by: As directed    Increase activity slowly as tolerated   Complete by: As directed    Post-operative opioid taper instructions:   Complete by: As directed    POST-OPERATIVE OPIOID TAPER INSTRUCTIONS: It is important to wean off of your opioid medication as soon as possible. If you do not need pain medication after your surgery it is ok to stop day one. Opioids include: Codeine, Hydrocodone(Norco, Vicodin), Oxycodone(Percocet, oxycontin) and hydromorphone amongst others.  Long term and even short term use of opiods can cause: Increased pain response Dependence Constipation Depression Respiratory depression And more.  Withdrawal symptoms can include Flu like symptoms Nausea, vomiting And more Techniques to manage these symptoms Hydrate well Eat regular healthy meals Stay active Use relaxation techniques(deep breathing, meditating, yoga) Do Not substitute Alcohol to help with tapering If you have been on opioids for less than two weeks and do not have pain than it is ok to stop all together.  Plan to wean off of opioids This plan should start within one week post op of your joint replacement. Maintain the same interval or time between taking each dose and first decrease the dose.  Cut the total daily intake of opioids by one tablet each day Next start to increase the time between doses. The last dose that should be eliminated is the evening dose.      Post-operative opioid taper instructions:   Complete by: As directed    POST-OPERATIVE OPIOID TAPER INSTRUCTIONS: It is important to wean off of your opioid medication as soon  as possible. If you do not need pain medication after your surgery it is ok to stop day one. Opioids include: Codeine, Hydrocodone(Norco, Vicodin), Oxycodone(Percocet, oxycontin) and hydromorphone amongst others.  Long term and even short term use of opiods can cause: Increased pain response Dependence Constipation Depression Respiratory depression And more.  Withdrawal symptoms can include Flu like symptoms Nausea, vomiting And more Techniques to manage these symptoms Hydrate well Eat regular healthy meals Stay active Use relaxation techniques(deep breathing, meditating, yoga) Do Not substitute Alcohol to help with tapering If you have been on opioids for less than two weeks and do not have pain than it is ok to stop all together.  Plan to wean off of opioids This plan should start within one week post op of your joint replacement. Maintain the same interval or time between taking each dose and first decrease the dose.  Cut the total daily intake of opioids by  one tablet each day Next start to increase the time between doses. The last dose that should be eliminated is the evening dose.          Allergies as of 01/21/2021       Reactions   Apple Anaphylaxis, Swelling   Aspirin Anaphylaxis, Swelling   Daucus [place] Anaphylaxis, Swelling   Haloperidol And Related Anaphylaxis, Swelling, Other (See Comments)   Lock jaw and slurred speech        Medication List     TAKE these medications    amitriptyline 25 MG tablet Commonly known as: ELAVIL Take 25 mg by mouth at bedtime as needed for sleep.   apixaban 2.5 MG Tabs tablet Commonly known as: ELIQUIS Take 1 tablet (2.5 mg total) by mouth every 12 (twelve) hours.   Butalbital-APAP-Caffeine 50-325-40 MG capsule Take 1 capsule by mouth every 6 (six) hours as needed for headache (migraine).   cetirizine 10 MG tablet Commonly known as: ZYRTEC Take 10 mg by mouth daily as needed for allergies.   cloNIDine 0.1 MG  tablet Commonly known as: CATAPRES TAKE 1 TABLET(0.1 MG) BY MOUTH TWICE DAILY What changed: See the new instructions.   Diclofenac Sodium 3 % Gel Apply 1 application topically 2 (two) times daily as needed (pain).   EPINEPHrine 0.3 mg/0.3 mL Soaj injection Commonly known as: EPI-PEN Inject 0.3 mg into the muscle as needed for anaphylaxis.   fluticasone 50 MCG/ACT nasal spray Commonly known as: FLONASE Place 1 spray into both nostrils daily as needed for allergies or rhinitis.   gabapentin 300 MG capsule Commonly known as: NEURONTIN Take 300 mg by mouth 2 (two) times daily.   hydrochlorothiazide 25 MG tablet Commonly known as: HYDRODIURIL TAKE 1 TABLET(25 MG) BY MOUTH DAILY What changed: See the new instructions.   HYDROmorphone 2 MG tablet Commonly known as: Dilaudid Take 1 tablet (2 mg total) by mouth every 4 (four) hours as needed for severe pain.   methocarbamol 500 MG tablet Commonly known as: Robaxin Take 1 tablet (500 mg total) by mouth every 6 (six) hours as needed.   ondansetron 4 MG tablet Commonly known as: ZOFRAN Take 4 mg by mouth 3 (three) times daily as needed for nausea or vomiting. What changed: Another medication with the same name was added. Make sure you understand how and when to take each.   ondansetron 4 MG tablet Commonly known as: Zofran Take 1 tablet (4 mg total) by mouth daily as needed for nausea or vomiting. What changed: You were already taking a medication with the same name, and this prescription was added. Make sure you understand how and when to take each.   oxyCODONE 15 MG immediate release tablet Commonly known as: ROXICODONE Take 15 mg by mouth every 12 (twelve) hours.   pantoprazole 40 MG tablet Commonly known as: PROTONIX Take 40 mg by mouth in the morning.   Phendimetrazine Tartrate 105 MG Cp24 Take 105 mg by mouth every morning. Last dose approx 12/22/20 per pt   polyethylene glycol 17 g packet Commonly known as: MIRALAX /  GLYCOLAX Take 17 g by mouth daily.   potassium chloride 10 MEQ tablet Commonly known as: KLOR-CON Take 2 tablets (20 mEq total) by mouth daily.   promethazine 25 MG tablet Commonly known as: PHENERGAN Take 1 tablet (25 mg total) by mouth every 6 (six) hours as needed for nausea or vomiting.   promethazine 25 MG suppository Commonly known as: PHENERGAN Place 1 suppository (25 mg total) rectally  every 8 (eight) hours as needed for up to 6 doses for nausea or vomiting.   SUMAtriptan 100 MG tablet Commonly known as: IMITREX Take 100 mg by mouth every 2 (two) hours as needed for migraine. May repeat in 2 hours if headache persists or recurs.   topiramate 100 MG tablet Commonly known as: TOPAMAX TAKE 1 AND 1/2 TABLETS(150 MG) BY MOUTH TWICE DAILY What changed: See the new instructions.   traZODone 50 MG tablet Commonly known as: DESYREL Take 50 mg by mouth at bedtime.   Vitamin D (Ergocalciferol) 1.25 MG (50000 UNIT) Caps capsule Commonly known as: DRISDOL Take 50,000 Units by mouth every Monday.          Signed: Augustin Schooling 01/21/2021, 6:47 AM  [facility] is now [facility]  [place] [address], [place], [place], [place] [address] Phone: [place]

## 2021-01-21 NOTE — Progress Notes (Signed)
Orthopedics Progress Note  Subjective: Patient feeling better this morning  Objective:  Vitals:   01/20/21 2204 01/20/21 2205  BP: 130/75 130/75  Pulse:  75  Resp:  20  Temp:  99.2 F (37.3 C)  SpO2:  100%    General: Awake and alert  Musculoskeletal: dressing changed, incision healing well, no drainage and no erythema, moderate swelling, Neg Homans Neurovascularly intact  Lab Results  Component Value Date   WBC 9.0 01/18/2021   HGB 12.4 01/18/2021   HCT 38.3 01/18/2021   MCV 93.2 01/18/2021   PLT 184 01/18/2021       Component Value Date/Time   NA 134 (L) 01/17/2021 0308   NA 140 02/18/2020 1447   K 3.1 (L) 01/17/2021 0308   CL 102 01/17/2021 0308   CO2 25 01/17/2021 0308   GLUCOSE 149 (H) 01/17/2021 0308   BUN 11 01/17/2021 0308   BUN 16 02/18/2020 1447   CREATININE 0.98 01/17/2021 0308   CREATININE 1.00 08/14/2020 1050   CALCIUM 8.7 (L) 01/17/2021 0308   GFRNONAA >60 01/17/2021 0308   GFRNONAA 62 08/14/2020 1050   GFRAA 72 08/14/2020 1050    Lab Results  Component Value Date   INR 1.01 10/27/2009   INR 0.93 08/27/2009    Assessment/Plan: POD #5 s/p Procedure(s): TOTAL KNEE ARTHROPLASTY Home today after morning therapy session Outpatient therapy scheduled  Viviann Spare R. Ranell Patrick, MD 01/21/2021 6:42 AM

## 2021-01-22 DIAGNOSIS — G894 Chronic pain syndrome: Secondary | ICD-10-CM | POA: Diagnosis not present

## 2021-01-23 DIAGNOSIS — I10 Essential (primary) hypertension: Secondary | ICD-10-CM | POA: Diagnosis not present

## 2021-01-23 DIAGNOSIS — M179 Osteoarthritis of knee, unspecified: Secondary | ICD-10-CM | POA: Diagnosis not present

## 2021-01-23 DIAGNOSIS — G894 Chronic pain syndrome: Secondary | ICD-10-CM | POA: Diagnosis not present

## 2021-01-24 DIAGNOSIS — G894 Chronic pain syndrome: Secondary | ICD-10-CM | POA: Diagnosis not present

## 2021-01-25 DIAGNOSIS — G894 Chronic pain syndrome: Secondary | ICD-10-CM | POA: Diagnosis not present

## 2021-01-26 DIAGNOSIS — M25562 Pain in left knee: Secondary | ICD-10-CM | POA: Diagnosis not present

## 2021-01-28 DIAGNOSIS — G894 Chronic pain syndrome: Secondary | ICD-10-CM | POA: Diagnosis not present

## 2021-01-29 DIAGNOSIS — G894 Chronic pain syndrome: Secondary | ICD-10-CM | POA: Diagnosis not present

## 2021-01-29 DIAGNOSIS — Z4789 Encounter for other orthopedic aftercare: Secondary | ICD-10-CM | POA: Diagnosis not present

## 2021-01-30 DIAGNOSIS — M25562 Pain in left knee: Secondary | ICD-10-CM | POA: Diagnosis not present

## 2021-01-30 DIAGNOSIS — G894 Chronic pain syndrome: Secondary | ICD-10-CM | POA: Diagnosis not present

## 2021-01-31 DIAGNOSIS — G894 Chronic pain syndrome: Secondary | ICD-10-CM | POA: Diagnosis not present

## 2021-02-01 DIAGNOSIS — G894 Chronic pain syndrome: Secondary | ICD-10-CM | POA: Diagnosis not present

## 2021-02-02 DIAGNOSIS — G894 Chronic pain syndrome: Secondary | ICD-10-CM | POA: Diagnosis not present

## 2021-02-02 DIAGNOSIS — M179 Osteoarthritis of knee, unspecified: Secondary | ICD-10-CM | POA: Diagnosis not present

## 2021-02-02 DIAGNOSIS — I10 Essential (primary) hypertension: Secondary | ICD-10-CM | POA: Diagnosis not present

## 2021-02-02 DIAGNOSIS — M25562 Pain in left knee: Secondary | ICD-10-CM | POA: Diagnosis not present

## 2021-02-03 DIAGNOSIS — G894 Chronic pain syndrome: Secondary | ICD-10-CM | POA: Diagnosis not present

## 2021-02-04 DIAGNOSIS — M25562 Pain in left knee: Secondary | ICD-10-CM | POA: Diagnosis not present

## 2021-02-04 DIAGNOSIS — G894 Chronic pain syndrome: Secondary | ICD-10-CM | POA: Diagnosis not present

## 2021-02-05 DIAGNOSIS — G894 Chronic pain syndrome: Secondary | ICD-10-CM | POA: Diagnosis not present

## 2021-02-06 DIAGNOSIS — G894 Chronic pain syndrome: Secondary | ICD-10-CM | POA: Diagnosis not present

## 2021-02-07 DIAGNOSIS — G894 Chronic pain syndrome: Secondary | ICD-10-CM | POA: Diagnosis not present

## 2021-02-08 DIAGNOSIS — G894 Chronic pain syndrome: Secondary | ICD-10-CM | POA: Diagnosis not present

## 2021-02-09 DIAGNOSIS — G894 Chronic pain syndrome: Secondary | ICD-10-CM | POA: Diagnosis not present

## 2021-02-09 DIAGNOSIS — M25562 Pain in left knee: Secondary | ICD-10-CM | POA: Diagnosis not present

## 2021-02-10 DIAGNOSIS — M25511 Pain in right shoulder: Secondary | ICD-10-CM | POA: Diagnosis not present

## 2021-02-10 DIAGNOSIS — R55 Syncope and collapse: Secondary | ICD-10-CM | POA: Diagnosis not present

## 2021-02-10 DIAGNOSIS — G894 Chronic pain syndrome: Secondary | ICD-10-CM | POA: Diagnosis not present

## 2021-02-10 DIAGNOSIS — M5137 Other intervertebral disc degeneration, lumbosacral region: Secondary | ICD-10-CM | POA: Diagnosis not present

## 2021-02-10 DIAGNOSIS — M545 Low back pain, unspecified: Secondary | ICD-10-CM | POA: Diagnosis not present

## 2021-02-10 DIAGNOSIS — M25512 Pain in left shoulder: Secondary | ICD-10-CM | POA: Diagnosis not present

## 2021-02-10 DIAGNOSIS — G629 Polyneuropathy, unspecified: Secondary | ICD-10-CM | POA: Diagnosis not present

## 2021-02-10 DIAGNOSIS — M25561 Pain in right knee: Secondary | ICD-10-CM | POA: Diagnosis not present

## 2021-02-10 DIAGNOSIS — M25562 Pain in left knee: Secondary | ICD-10-CM | POA: Diagnosis not present

## 2021-02-10 DIAGNOSIS — M542 Cervicalgia: Secondary | ICD-10-CM | POA: Diagnosis not present

## 2021-02-10 DIAGNOSIS — M79609 Pain in unspecified limb: Secondary | ICD-10-CM | POA: Diagnosis not present

## 2021-02-11 DIAGNOSIS — G894 Chronic pain syndrome: Secondary | ICD-10-CM | POA: Diagnosis not present

## 2021-02-11 DIAGNOSIS — M25562 Pain in left knee: Secondary | ICD-10-CM | POA: Diagnosis not present

## 2021-02-12 DIAGNOSIS — G894 Chronic pain syndrome: Secondary | ICD-10-CM | POA: Diagnosis not present

## 2021-02-13 DIAGNOSIS — G894 Chronic pain syndrome: Secondary | ICD-10-CM | POA: Diagnosis not present

## 2021-02-14 DIAGNOSIS — G894 Chronic pain syndrome: Secondary | ICD-10-CM | POA: Diagnosis not present

## 2021-02-15 DIAGNOSIS — G894 Chronic pain syndrome: Secondary | ICD-10-CM | POA: Diagnosis not present

## 2021-02-16 DIAGNOSIS — G894 Chronic pain syndrome: Secondary | ICD-10-CM | POA: Diagnosis not present

## 2021-02-16 DIAGNOSIS — M25562 Pain in left knee: Secondary | ICD-10-CM | POA: Diagnosis not present

## 2021-02-17 ENCOUNTER — Emergency Department (HOSPITAL_COMMUNITY): Payer: Medicaid Other

## 2021-02-17 ENCOUNTER — Other Ambulatory Visit: Payer: Self-pay

## 2021-02-17 ENCOUNTER — Emergency Department (HOSPITAL_COMMUNITY)
Admission: EM | Admit: 2021-02-17 | Discharge: 2021-02-18 | Disposition: A | Discharge status: Home / Self Care | Payer: Medicaid Other | Attending: Emergency Medicine | Admitting: Emergency Medicine

## 2021-02-17 DIAGNOSIS — I10 Essential (primary) hypertension: Secondary | ICD-10-CM | POA: Insufficient documentation

## 2021-02-17 DIAGNOSIS — M25512 Pain in left shoulder: Secondary | ICD-10-CM | POA: Diagnosis not present

## 2021-02-17 DIAGNOSIS — Z79899 Other long term (current) drug therapy: Secondary | ICD-10-CM | POA: Diagnosis not present

## 2021-02-17 DIAGNOSIS — G894 Chronic pain syndrome: Secondary | ICD-10-CM | POA: Diagnosis not present

## 2021-02-17 DIAGNOSIS — M7989 Other specified soft tissue disorders: Secondary | ICD-10-CM | POA: Diagnosis not present

## 2021-02-17 DIAGNOSIS — Z7901 Long term (current) use of anticoagulants: Secondary | ICD-10-CM | POA: Insufficient documentation

## 2021-02-17 DIAGNOSIS — R791 Abnormal coagulation profile: Secondary | ICD-10-CM | POA: Insufficient documentation

## 2021-02-17 DIAGNOSIS — M179 Osteoarthritis of knee, unspecified: Secondary | ICD-10-CM | POA: Diagnosis not present

## 2021-02-17 DIAGNOSIS — R6889 Other general symptoms and signs: Secondary | ICD-10-CM | POA: Diagnosis not present

## 2021-02-17 DIAGNOSIS — R55 Syncope and collapse: Secondary | ICD-10-CM | POA: Insufficient documentation

## 2021-02-17 DIAGNOSIS — G4489 Other headache syndrome: Secondary | ICD-10-CM | POA: Diagnosis not present

## 2021-02-17 DIAGNOSIS — Z9049 Acquired absence of other specified parts of digestive tract: Secondary | ICD-10-CM | POA: Diagnosis not present

## 2021-02-17 DIAGNOSIS — Z96652 Presence of left artificial knee joint: Secondary | ICD-10-CM | POA: Insufficient documentation

## 2021-02-17 DIAGNOSIS — Z743 Need for continuous supervision: Secondary | ICD-10-CM | POA: Diagnosis not present

## 2021-02-17 DIAGNOSIS — J9811 Atelectasis: Secondary | ICD-10-CM | POA: Diagnosis not present

## 2021-02-17 DIAGNOSIS — R404 Transient alteration of awareness: Secondary | ICD-10-CM | POA: Diagnosis not present

## 2021-02-17 DIAGNOSIS — R110 Nausea: Secondary | ICD-10-CM | POA: Diagnosis not present

## 2021-02-17 DIAGNOSIS — R519 Headache, unspecified: Secondary | ICD-10-CM | POA: Diagnosis not present

## 2021-02-17 LAB — COMPREHENSIVE METABOLIC PANEL
ALT: 22 U/L (ref 0–44)
AST: 34 U/L (ref 15–41)
Albumin: 3.3 g/dL — ABNORMAL LOW (ref 3.5–5.0)
Alkaline Phosphatase: 85 U/L (ref 38–126)
Anion gap: 8 (ref 5–15)
BUN: 10 mg/dL (ref 6–20)
CO2: 25 mmol/L (ref 22–32)
Calcium: 9.1 mg/dL (ref 8.9–10.3)
Chloride: 106 mmol/L (ref 98–111)
Creatinine, Ser: 1.08 mg/dL — ABNORMAL HIGH (ref 0.44–1.00)
GFR, Estimated: 60 mL/min — ABNORMAL LOW (ref >60)
Glucose, Bld: 113 mg/dL — ABNORMAL HIGH (ref 70–99)
Potassium: 4.7 mmol/L (ref 3.5–5.1)
Sodium: 139 mmol/L (ref 135–145)
Total Bilirubin: 1 mg/dL (ref 0.3–1.2)
Total Protein: 6.1 g/dL — ABNORMAL LOW (ref 6.5–8.1)

## 2021-02-17 LAB — CBC WITH DIFFERENTIAL/PLATELET
Abs Immature Granulocytes: 0.01 10*3/uL (ref 0.00–0.07)
Basophils Absolute: 0 10*3/uL (ref 0.0–0.1)
Basophils Relative: 1 %
Eosinophils Absolute: 0.2 10*3/uL (ref 0.0–0.5)
Eosinophils Relative: 5 %
HCT: 37.3 % (ref 36.0–46.0)
Hemoglobin: 12.1 g/dL (ref 12.0–15.0)
Immature Granulocytes: 0 %
Lymphocytes Relative: 36 %
Lymphs Abs: 1.4 10*3/uL (ref 0.7–4.0)
MCH: 30.4 pg (ref 26.0–34.0)
MCHC: 32.4 g/dL (ref 30.0–36.0)
MCV: 93.7 fL (ref 80.0–100.0)
Monocytes Absolute: 0.4 10*3/uL (ref 0.1–1.0)
Monocytes Relative: 10 %
Neutro Abs: 1.9 10*3/uL (ref 1.7–7.7)
Neutrophils Relative %: 48 %
Platelets: 228 10*3/uL (ref 150–400)
RBC: 3.98 MIL/uL (ref 3.87–5.11)
RDW: 13.6 % (ref 11.5–15.5)
WBC: 3.9 10*3/uL — ABNORMAL LOW (ref 4.0–10.5)
nRBC: 0 % (ref 0.0–0.2)

## 2021-02-17 LAB — D-DIMER, QUANTITATIVE: D-Dimer, Quant: 4.23 ug/mL-FEU — ABNORMAL HIGH (ref 0.00–0.50)

## 2021-02-17 LAB — TROPONIN I (HIGH SENSITIVITY): Troponin I (High Sensitivity): 7 ng/L (ref <18)

## 2021-02-17 MED ORDER — DIPHENHYDRAMINE HCL 50 MG/ML IJ SOLN
25.0000 mg | Freq: Once | INTRAMUSCULAR | Status: AC
  Administered 2021-02-17: 25 mg via INTRAVENOUS
  Filled 2021-02-17: qty 1

## 2021-02-17 MED ORDER — PROCHLORPERAZINE EDISYLATE 10 MG/2ML IJ SOLN
10.0000 mg | Freq: Once | INTRAMUSCULAR | Status: AC
  Administered 2021-02-17: 10 mg via INTRAVENOUS
  Filled 2021-02-17: qty 2

## 2021-02-17 MED ORDER — DEXAMETHASONE 4 MG PO TABS
10.0000 mg | ORAL_TABLET | Freq: Once | ORAL | Status: AC
  Administered 2021-02-17: 10 mg via ORAL
  Filled 2021-02-17: qty 3

## 2021-02-17 MED ORDER — SODIUM CHLORIDE 0.9 % IV BOLUS
1000.0000 mL | Freq: Once | INTRAVENOUS | Status: AC
  Administered 2021-02-17: 1000 mL via INTRAVENOUS

## 2021-02-17 MED ORDER — IOHEXOL 350 MG/ML SOLN
80.0000 mL | Freq: Once | INTRAVENOUS | Status: AC | PRN
  Administered 2021-02-17: 80 mL via INTRAVENOUS

## 2021-02-17 NOTE — ED Triage Notes (Signed)
Pt BIB EMS due to a syncopal episode at home witnessed by family. Pt slid down and did not fall or hit her head. Pt has chronic migraines and takes medication for it. Pt has been seen before for these episodes but states they can not find a reason for them. Pt is axox4. VSS. Pt recently had left knee surgery a month ago.

## 2021-02-17 NOTE — ED Provider Notes (Signed)
[facility] Provider Note   CSN: [id number] Arrival date & time: 02/17/21  2003     History Chief Complaint  Patient presents with   Loss of Consciousness    Kirsten Walsh is a 60 y.o. female.  60 yo F with a chief complaint of a syncopal event.  The patient states that she was taking things off of a table and suddenly felt unwell.  She got nauseated and then collapsed to the ground.  She denies any injury in the fall.  She recently had a left knee surgery about a month ago.  Feels like things from that standpoint.  Gotten mildly better.  She denies any chest pain or difficulty breathing.  Has had daily headaches that are similar to her typical migraines.  Not drastically changed.  Denies any new head injury.  The history is provided by the patient.  Loss of Consciousness Episode history:  Single Most recent episode:  Today Duration:  20 seconds Timing:  Rare Progression:  Resolved Chronicity:  New Witnessed: yes   Relieved by:  Nothing Worsened by:  Nothing Ineffective treatments:  None tried Associated symptoms: headaches   Associated symptoms: no chest pain, no dizziness, no fever, no nausea, no palpitations, no shortness of breath and no vomiting       Past Medical History:  Diagnosis Date   Arthritis    Chest pain    Chronic lower back pain    "right side to mid back" (07/06/2017)   Family history of adverse reaction to anesthesia    "daughter did; not sure happened"   Gall stones    History of anaphylaxis 06/26/2014   History of kidney stones    Hypertension    Migraine    "qd" (07/06/2017)   Syncope and collapse 07/06/2017    sitting on the commode; developed nausea and passed out; hit head on shower and suffered a laceration; woke up in "a pool of blood"    Patient Active Problem List   Diagnosis Date Noted   S/P TKR (total knee replacement), left 01/18/2021   H/O total knee replacement, left 01/16/2021    Cervical cancer screening 03/09/2020   Encounter for administration of vaccine 03/09/2020   Preoperative clearance 07/24/2019   Right arm pain 06/12/2019   Syncope 07/06/2017   Chronic daily headache 09/02/2015   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   Syncopal episodes 09/02/2015   Right arm weakness 08/05/2015   Edema of right arm and right leg 09/07/2014   Intractable migraine with status migrainosus 06/10/2014   Chronic migraine without aura, intractable, with status migrainosus    Bradycardia 02/18/2013   Left knee pain 01/16/2013   Cardiomyopathy 10/20/2011   Leg swelling 09/23/2011   Complex regional pain syndrome of right lower extremity 05/03/2011   Chronic pain 03/29/2011   NECK PAIN, RIGHT 02/03/2010   ALLERGIC RHINITIS 01/30/2009   DIZZINESS 07/16/2008   Chronic migraine 04/26/2007   OBESITY, NOS 08/18/2006   HYPERTENSION, BENIGN SYSTEMIC 08/18/2006    Past Surgical History:  Procedure Laterality Date   CHONDROPLASTY Left 08/17/2019   Procedure: CHONDROPLASTY;  Surgeon: Frederico Hammanaffrey, Daniel, MD;  Location: [facility];  Service: Orthopedics;  Laterality: Left;   CYSTOSCOPY W/ STONE MANIPULATION  ~ 2006   INTRAUTERINE DEVICE INSERTION     "initially put in in 04/2002; changed prn" (02/14/2013)   KNEE ARTHROSCOPY WITH LATERAL MENISECTOMY  08/17/2019   Procedure: KNEE ARTHROSCOPY WITH LATERAL MENISECTOMY;  Surgeon: Frederico Hammanaffrey, Daniel, MD;  Location: [facility];  Service: Orthopedics;;   KNEE ARTHROSCOPY WITH MEDIAL MENISECTOMY Left 08/17/2019   Procedure: KNEE ARTHROSCOPY WITH MEDIAL MENISECTOMY;  Surgeon: Frederico Hamman, MD;  Location:  SURGERY CENTER;  Service: Orthopedics;  Laterality: Left;   LAPAROSCOPIC CHOLECYSTECTOMY  ~ 2004   TOTAL KNEE ARTHROPLASTY Left 01/16/2021   Procedure: TOTAL KNEE ARTHROPLASTY;  Surgeon: Beverely Low, MD;  Location: [facility];  Service: Orthopedics;  Laterality: Left;  with adductor canal     OB History   No  obstetric history on file.     Family History  Problem Relation Age of Onset   Cancer Mother    Pancreatic cancer Mother    Cirrhosis Father    Lupus Sister    Diabetes Maternal Grandmother     Social History   Tobacco Use   Smoking status: Never   Smokeless tobacco: Never  Vaping Use   Vaping Use: Never used  Substance Use Topics   Alcohol use: Yes    Comment: social   Drug use: No    Home Medications Prior to Admission medications   Medication Sig Start Date End Date Taking? Authorizing Provider  amitriptyline (ELAVIL) 25 MG tablet Take 25 mg by mouth at bedtime as needed for sleep. 08/26/20   [provider]  apixaban (ELIQUIS) 2.5 MG TABS tablet Take 1 tablet (2.5 mg total) by mouth every 12 (twelve) hours. 01/21/21 02/20/21  Beverely Low, MD  Butalbital-APAP-Caffeine [phone] MG capsule Take 1 capsule by mouth every 6 (six) hours as needed for headache (migraine). 09/04/20   [provider]  cetirizine (ZYRTEC) 10 MG tablet Take 10 mg by mouth daily as needed for allergies.    [provider]  cloNIDine (CATAPRES) 0.1 MG tablet TAKE 1 TABLET(0.1 MG) BY MOUTH TWICE DAILY 08/28/18   Howard Pouch, MD  Diclofenac Sodium 3 % GEL Apply 1 application topically 2 (two) times daily as needed (pain). 09/06/20   [provider]  EPINEPHrine 0.3 mg/0.3 mL IJ SOAJ injection Inject 0.3 mg into the muscle as needed for anaphylaxis. 09/18/20   [provider]  fluticasone (FLONASE) 50 MCG/ACT nasal spray Place 1 spray into both nostrils daily as needed for allergies or rhinitis.    [provider]  gabapentin (NEURONTIN) 300 MG capsule Take 300 mg by mouth 2 (two) times daily. 08/26/20   [provider]  hydrochlorothiazide (HYDRODIURIL) 25 MG tablet TAKE 1 TABLET(25 MG) BY MOUTH DAILY 08/28/18   Howard Pouch, MD  HYDROmorphone (DILAUDID) 2 MG tablet Take 1 tablet (2 mg total) by mouth every 4 (four) hours as needed for severe pain.  01/16/21   Beverely Low, MD  methocarbamol (ROBAXIN) 500 MG tablet Take 1 tablet (500 mg total) by mouth every 6 (six) hours as needed. 01/16/21   Beverely Low, MD  ondansetron (ZOFRAN) 4 MG tablet Take 4 mg by mouth 3 (three) times daily as needed for nausea or vomiting.    [provider]  ondansetron (ZOFRAN) 4 MG tablet Take 1 tablet (4 mg total) by mouth daily as needed for nausea or vomiting. 01/16/21 01/16/22  Beverely Low, MD  oxyCODONE (ROXICODONE) 15 MG immediate release tablet Take 15 mg by mouth every 12 (twelve) hours. 09/04/20   [provider]  pantoprazole (PROTONIX) 40 MG tablet Take 40 mg by mouth in the morning.    [provider]  Phendimetrazine Tartrate 105 MG CP24 Take 105 mg by mouth every morning. Last dose approx 12/22/20 per  pt 09/21/20   [provider]  polyethylene glycol (MIRALAX / GLYCOLAX) 17 g packet Take 17 g by mouth daily.    [provider]  potassium chloride (KLOR-CON) 10 MEQ tablet Take 2 tablets (20 mEq total) by mouth daily. 02/20/20   Dana Allan, MD  promethazine (PHENERGAN) 25 MG suppository Place 1 suppository (25 mg total) rectally every 8 (eight) hours as needed for up to 6 doses for nausea or vomiting. 12/02/20   Terald Sleeper, MD  promethazine (PHENERGAN) 25 MG tablet Take 1 tablet (25 mg total) by mouth every 6 (six) hours as needed for nausea or vomiting. 04/30/19   Wieters, Hallie C, PA-C  SUMAtriptan (IMITREX) 100 MG tablet Take 100 mg by mouth every 2 (two) hours as needed for migraine. May repeat in 2 hours if headache persists or recurs.    [provider]  topiramate (TOPAMAX) 100 MG tablet TAKE 1 AND 1/2 TABLETS(150 MG) BY MOUTH TWICE DAILY 08/01/18   Howard Pouch, MD  traZODone (DESYREL) 50 MG tablet Take 50 mg by mouth at bedtime.    [provider]  Vitamin D, Ergocalciferol, (DRISDOL) 1.25 MG (50000 UNIT) CAPS capsule Take 50,000 Units by mouth every Monday. 09/14/20   [provider]  potassium chloride SA (KLOR-CON) 20 MEQ tablet Take 1 tablet (20 mEq total) by mouth daily for 3 days. 04/05/19 04/30/19  Pricilla Loveless, MD    Allergies    Apple, Aspirin, Daucus carota, and Haloperidol and related  Review of Systems   Review of Systems  Constitutional:  Negative for chills and fever.  HENT:  Negative for congestion and rhinorrhea.   Eyes:  Negative for redness and visual disturbance.  Respiratory:  Negative for shortness of breath and wheezing.   Cardiovascular:  Positive for syncope. Negative for chest pain and palpitations.  Gastrointestinal:  Negative for nausea and vomiting.  Genitourinary:  Negative for dysuria and urgency.  Musculoskeletal:  Negative for arthralgias and myalgias.  Skin:  Negative for pallor and wound.  Neurological:  Positive for syncope and headaches. Negative for dizziness.   Physical Exam Updated Vital Signs BP 126/70   Pulse 70   Temp 98.7 F (37.1 C) (Oral)   Resp 12   SpO2 100%   Physical Exam Vitals and nursing note reviewed.  Constitutional:      General: She is not in acute distress.    Appearance: She is well-developed. She is not diaphoretic.  HENT:     Head: Normocephalic and atraumatic.  Eyes:     Pupils: Pupils are equal, round, and reactive to light.  Cardiovascular:     Rate and Rhythm: Normal rate and regular rhythm.     Heart sounds: No murmur heard.   No friction rub. No gallop.  Pulmonary:     Effort: Pulmonary effort is normal.     Breath sounds: No wheezing or rales.  Abdominal:     General: There is no distension.     Palpations: Abdomen is soft.     Tenderness: There is no abdominal tenderness.  Musculoskeletal:        General: Tenderness present.     Cervical back: Normal range of motion and neck supple.     Comments: Mild swelling and pain to the left lower extremity.  Skin:    General: Skin is warm and dry.  Neurological:     Mental Status: She is alert and oriented to person,  place, and time.  Comments: Mildly limited secondary to pain with the left shoulder and left lower extremity.  Otherwise benign neurologic exam.  Psychiatric:        Behavior: Behavior normal.    ED Results / Procedures / Treatments   Labs (all labs ordered are listed, but only abnormal results are displayed) Labs Reviewed  COMPREHENSIVE METABOLIC PANEL - Abnormal; Notable for the following components:      Result Value   Glucose, Bld 113 (*)    Creatinine, Ser 1.08 (*)    Total Protein 6.1 (*)    Albumin 3.3 (*)    GFR, Estimated 60 (*)    All other components within normal limits  D-DIMER, QUANTITATIVE - Abnormal; Notable for the following components:   D-Dimer, Quant 4.23 (*)    All other components within normal limits  CBC WITH DIFFERENTIAL/PLATELET - Abnormal; Notable for the following components:   WBC 3.9 (*)    All other components within normal limits  CBC WITH DIFFERENTIAL/PLATELET  TROPONIN I (HIGH SENSITIVITY)    EKG EKG Interpretation  Date/Time:  Tuesday February 17 2021 20:21:57 EDT Ventricular Rate:  76 PR Interval:  156 QRS Duration: 96 QT Interval:  438 QTC Calculation: 490 R Axis:   10 Text Interpretation: Sinus rhythm Low voltage, precordial leads Consider anterior infarct no wpw, prolonged qt or brudaga No significant change since last tracing Confirmed by Melene Plan [phone]) on 02/17/2021 8:47:50 PM  Radiology CT Angio Chest PE W and/or Wo Contrast  Result Date: 02/17/2021 CLINICAL DATA:  PE suspected, low/intermediate prob, positive D-dimer EXAM: CT ANGIOGRAPHY CHEST WITH CONTRAST TECHNIQUE: Multidetector CT imaging of the chest was performed using the standard protocol during bolus administration of intravenous contrast. Multiplanar CT image reconstructions and MIPs were obtained to evaluate the vascular anatomy. CONTRAST:  80mL OMNIPAQUE IOHEXOL 350 MG/ML SOLN COMPARISON:  Chest x-ray 04/05/2019 FINDINGS: Cardiovascular: Satisfactory opacification  of the pulmonary arteries to the segmental level. No evidence of pulmonary embolism. Normal heart size. No significant pericardial effusion. The thoracic aorta is normal in caliber. No atherosclerotic plaque of the thoracic aorta. No coronary artery calcifications. Mediastinum/Nodes: No enlarged mediastinal, hilar, or axillary lymph nodes. Thyroid gland, trachea, and esophagus demonstrate no significant findings. Lungs/Pleura: Linear atelectasis versus scarring of the left lower lobe. Bilateral lower lobe subsegmental atelectasis. No focal consolidation. No pulmonary nodule. No pulmonary mass. No pleural effusion. No pneumothorax. Upper Abdomen: No acute abnormality. Status post cholecystectomy. A splenule is noted. Musculoskeletal: No abdominal wall hernia or abnormality. No suspicious lytic or blastic osseous lesions. No acute displaced fracture. Review of the MIP images confirms the above findings. IMPRESSION: 1. No pulmonary embolus. 2. No acute intrathoracic abnormality. Electronically Signed   By: Tish Frederickson M.D.   On: 02/17/2021 23:18    Procedures Procedures   Medications Ordered in ED Medications  dexamethasone (DECADRON) tablet 10 mg (has no administration in time range)  sodium chloride 0.9 % bolus 1,000 mL (0 mLs Intravenous Stopped 02/17/21 2212)  prochlorperazine (COMPAZINE) injection 10 mg (10 mg Intravenous Given 02/17/21 2050)  diphenhydrAMINE (BENADRYL) injection 25 mg (25 mg Intravenous Given 02/17/21 2052)  iohexol (OMNIPAQUE) 350 MG/ML injection 80 mL (80 mLs Intravenous Contrast Given 02/17/21 2301)    ED Course  I have reviewed the triage vital signs and the nursing notes.  Pertinent labs & imaging results that were available during my care of the patient were reviewed by me and considered in my medical decision making (see chart for details).    MDM  Rules/Calculators/A&P                           60 yo F with a chief complaint of a syncopal event.  Sounds vasovagal by  history.  Complicated by recent surgical procedure.  D-dimer elevated at 4.2.  Will obtain a CT angiogram of the chest.  Blood work bolus of IV fluids headache cocktail reassess.  No significant anemia no significant electrolyte abnormality.  D-dimer unfortunately is elevated.  CT angiogram of the chest without pulmonary embolism.  Patient headache improved on reassessment.  Will discharge home.  PCP orthopedics and neurology follow-up.  11:43 PM:  I have discussed the diagnosis/risks/treatment options with the patient and caregiver and believe the pt to be eligible for discharge home to follow-up with PCP, neuro, Ortho. We also discussed returning to the ED immediately if new or worsening sx occur. We discussed the sx which are most concerning (e.g., sudden worsening pain, fever, inability to tolerate by mouth, stroke s/x, recurrent syncope) that necessitate immediate return. Medications administered to the patient during their visit and any new prescriptions provided to the patient are listed below.  Medications given during this visit Medications  dexamethasone (DECADRON) tablet 10 mg (has no administration in time range)  sodium chloride 0.9 % bolus 1,000 mL (0 mLs Intravenous Stopped 02/17/21 2212)  prochlorperazine (COMPAZINE) injection 10 mg (10 mg Intravenous Given 02/17/21 2050)  diphenhydrAMINE (BENADRYL) injection 25 mg (25 mg Intravenous Given 02/17/21 2052)  iohexol (OMNIPAQUE) 350 MG/ML injection 80 mL (80 mLs Intravenous Contrast Given 02/17/21 2301)     The patient appears reasonably screen and/or stabilized for discharge and I doubt any other medical condition or other [facility] requiring further screening, evaluation, or treatment in the ED at this time prior to discharge.   Final Clinical Impression(s) / ED Diagnoses Final diagnoses:  Syncope and collapse  Headache syndrome    Rx / DC Orders ED Discharge Orders          Ordered    LE VENOUS        02/17/21 2339              Melene Plan, DO 02/17/21 2343

## 2021-02-17 NOTE — ED Notes (Signed)
Patient transported to CT 

## 2021-02-17 NOTE — Discharge Instructions (Addendum)
You had a blood test that was positive that makes it difficult for me to rule out a blood clot.  Your CT scan of your chest was negative but if you had recent surgery in your leg is still swollen and painful that he may have a clot in your leg.  I have ordered an ultrasound for this.  You need to return tomorrow and have it done in the radiology department.  You could alternatively talk this over with your family doctor and orthopedist.  Your headache hopefully will go away after you go home and go to sleep.  Please contact your neurologist and let them know that you had ongoing headaches since your procedure and see when they want to see you in the office.

## 2021-02-18 ENCOUNTER — Ambulatory Visit (HOSPITAL_COMMUNITY): Payer: Medicaid Other | Attending: Emergency Medicine

## 2021-02-18 DIAGNOSIS — K219 Gastro-esophageal reflux disease without esophagitis: Secondary | ICD-10-CM | POA: Diagnosis not present

## 2021-02-18 DIAGNOSIS — J302 Other seasonal allergic rhinitis: Secondary | ICD-10-CM | POA: Diagnosis not present

## 2021-02-18 DIAGNOSIS — M179 Osteoarthritis of knee, unspecified: Secondary | ICD-10-CM | POA: Diagnosis not present

## 2021-02-18 DIAGNOSIS — G894 Chronic pain syndrome: Secondary | ICD-10-CM | POA: Diagnosis not present

## 2021-02-19 DIAGNOSIS — G894 Chronic pain syndrome: Secondary | ICD-10-CM | POA: Diagnosis not present

## 2021-02-19 DIAGNOSIS — Z419 Encounter for procedure for purposes other than remedying health state, unspecified: Secondary | ICD-10-CM | POA: Diagnosis not present

## 2021-02-20 DIAGNOSIS — G894 Chronic pain syndrome: Secondary | ICD-10-CM | POA: Diagnosis not present

## 2021-02-21 DIAGNOSIS — G894 Chronic pain syndrome: Secondary | ICD-10-CM | POA: Diagnosis not present

## 2021-02-22 DIAGNOSIS — G894 Chronic pain syndrome: Secondary | ICD-10-CM | POA: Diagnosis not present

## 2021-02-23 DIAGNOSIS — G894 Chronic pain syndrome: Secondary | ICD-10-CM | POA: Diagnosis not present

## 2021-02-24 DIAGNOSIS — M25562 Pain in left knee: Secondary | ICD-10-CM | POA: Diagnosis not present

## 2021-02-24 DIAGNOSIS — G894 Chronic pain syndrome: Secondary | ICD-10-CM | POA: Diagnosis not present

## 2021-02-25 DIAGNOSIS — G894 Chronic pain syndrome: Secondary | ICD-10-CM | POA: Diagnosis not present

## 2021-02-26 DIAGNOSIS — G894 Chronic pain syndrome: Secondary | ICD-10-CM | POA: Diagnosis not present

## 2021-02-27 DIAGNOSIS — G894 Chronic pain syndrome: Secondary | ICD-10-CM | POA: Diagnosis not present

## 2021-02-28 DIAGNOSIS — G894 Chronic pain syndrome: Secondary | ICD-10-CM | POA: Diagnosis not present

## 2021-03-01 DIAGNOSIS — G894 Chronic pain syndrome: Secondary | ICD-10-CM | POA: Diagnosis not present

## 2021-03-02 DIAGNOSIS — G894 Chronic pain syndrome: Secondary | ICD-10-CM | POA: Diagnosis not present

## 2021-03-03 DIAGNOSIS — G894 Chronic pain syndrome: Secondary | ICD-10-CM | POA: Diagnosis not present

## 2021-03-04 DIAGNOSIS — M25562 Pain in left knee: Secondary | ICD-10-CM | POA: Diagnosis not present

## 2021-03-04 DIAGNOSIS — G894 Chronic pain syndrome: Secondary | ICD-10-CM | POA: Diagnosis not present

## 2021-03-05 DIAGNOSIS — G894 Chronic pain syndrome: Secondary | ICD-10-CM | POA: Diagnosis not present

## 2021-03-06 DIAGNOSIS — G894 Chronic pain syndrome: Secondary | ICD-10-CM | POA: Diagnosis not present

## 2021-03-06 DIAGNOSIS — M25562 Pain in left knee: Secondary | ICD-10-CM | POA: Diagnosis not present

## 2021-03-07 DIAGNOSIS — G894 Chronic pain syndrome: Secondary | ICD-10-CM | POA: Diagnosis not present

## 2021-03-08 DIAGNOSIS — G894 Chronic pain syndrome: Secondary | ICD-10-CM | POA: Diagnosis not present

## 2021-03-09 DIAGNOSIS — G894 Chronic pain syndrome: Secondary | ICD-10-CM | POA: Diagnosis not present

## 2021-03-10 DIAGNOSIS — R0989 Other specified symptoms and signs involving the circulatory and respiratory systems: Secondary | ICD-10-CM | POA: Diagnosis not present

## 2021-03-10 DIAGNOSIS — M25561 Pain in right knee: Secondary | ICD-10-CM | POA: Diagnosis not present

## 2021-03-10 DIAGNOSIS — M25511 Pain in right shoulder: Secondary | ICD-10-CM | POA: Diagnosis not present

## 2021-03-10 DIAGNOSIS — M542 Cervicalgia: Secondary | ICD-10-CM | POA: Diagnosis not present

## 2021-03-10 DIAGNOSIS — R55 Syncope and collapse: Secondary | ICD-10-CM | POA: Diagnosis not present

## 2021-03-10 DIAGNOSIS — G894 Chronic pain syndrome: Secondary | ICD-10-CM | POA: Diagnosis not present

## 2021-03-10 DIAGNOSIS — M170 Bilateral primary osteoarthritis of knee: Secondary | ICD-10-CM | POA: Diagnosis not present

## 2021-03-10 DIAGNOSIS — M545 Low back pain, unspecified: Secondary | ICD-10-CM | POA: Diagnosis not present

## 2021-03-10 DIAGNOSIS — M25562 Pain in left knee: Secondary | ICD-10-CM | POA: Diagnosis not present

## 2021-03-10 DIAGNOSIS — M79609 Pain in unspecified limb: Secondary | ICD-10-CM | POA: Diagnosis not present

## 2021-03-10 DIAGNOSIS — R519 Headache, unspecified: Secondary | ICD-10-CM | POA: Diagnosis not present

## 2021-03-11 DIAGNOSIS — G894 Chronic pain syndrome: Secondary | ICD-10-CM | POA: Diagnosis not present

## 2021-03-11 DIAGNOSIS — M25562 Pain in left knee: Secondary | ICD-10-CM | POA: Diagnosis not present

## 2021-03-12 DIAGNOSIS — G894 Chronic pain syndrome: Secondary | ICD-10-CM | POA: Diagnosis not present

## 2021-03-13 DIAGNOSIS — G894 Chronic pain syndrome: Secondary | ICD-10-CM | POA: Diagnosis not present

## 2021-03-14 DIAGNOSIS — G894 Chronic pain syndrome: Secondary | ICD-10-CM | POA: Diagnosis not present

## 2021-03-15 DIAGNOSIS — G894 Chronic pain syndrome: Secondary | ICD-10-CM | POA: Diagnosis not present

## 2021-03-16 DIAGNOSIS — G894 Chronic pain syndrome: Secondary | ICD-10-CM | POA: Diagnosis not present

## 2021-03-17 DIAGNOSIS — M25562 Pain in left knee: Secondary | ICD-10-CM | POA: Diagnosis not present

## 2021-03-17 DIAGNOSIS — G894 Chronic pain syndrome: Secondary | ICD-10-CM | POA: Diagnosis not present

## 2021-03-18 DIAGNOSIS — G894 Chronic pain syndrome: Secondary | ICD-10-CM | POA: Diagnosis not present

## 2021-03-19 DIAGNOSIS — G894 Chronic pain syndrome: Secondary | ICD-10-CM | POA: Diagnosis not present

## 2021-03-19 DIAGNOSIS — M25562 Pain in left knee: Secondary | ICD-10-CM | POA: Diagnosis not present

## 2021-03-20 DIAGNOSIS — G894 Chronic pain syndrome: Secondary | ICD-10-CM | POA: Diagnosis not present

## 2021-03-21 DIAGNOSIS — Z419 Encounter for procedure for purposes other than remedying health state, unspecified: Secondary | ICD-10-CM | POA: Diagnosis not present

## 2021-03-21 DIAGNOSIS — G894 Chronic pain syndrome: Secondary | ICD-10-CM | POA: Diagnosis not present

## 2021-03-22 DIAGNOSIS — G894 Chronic pain syndrome: Secondary | ICD-10-CM | POA: Diagnosis not present

## 2021-03-23 DIAGNOSIS — G894 Chronic pain syndrome: Secondary | ICD-10-CM | POA: Diagnosis not present

## 2021-03-24 DIAGNOSIS — G894 Chronic pain syndrome: Secondary | ICD-10-CM | POA: Diagnosis not present

## 2021-03-25 DIAGNOSIS — M25562 Pain in left knee: Secondary | ICD-10-CM | POA: Diagnosis not present

## 2021-03-25 DIAGNOSIS — G894 Chronic pain syndrome: Secondary | ICD-10-CM | POA: Diagnosis not present

## 2021-03-26 DIAGNOSIS — G894 Chronic pain syndrome: Secondary | ICD-10-CM | POA: Diagnosis not present

## 2021-03-27 DIAGNOSIS — G894 Chronic pain syndrome: Secondary | ICD-10-CM | POA: Diagnosis not present

## 2021-03-28 DIAGNOSIS — G894 Chronic pain syndrome: Secondary | ICD-10-CM | POA: Diagnosis not present

## 2021-03-29 DIAGNOSIS — G894 Chronic pain syndrome: Secondary | ICD-10-CM | POA: Diagnosis not present

## 2021-03-30 DIAGNOSIS — G894 Chronic pain syndrome: Secondary | ICD-10-CM | POA: Diagnosis not present

## 2021-03-30 DIAGNOSIS — M25562 Pain in left knee: Secondary | ICD-10-CM | POA: Diagnosis not present

## 2021-03-31 DIAGNOSIS — Z6833 Body mass index (BMI) 33.0-33.9, adult: Secondary | ICD-10-CM | POA: Diagnosis not present

## 2021-03-31 DIAGNOSIS — Z1211 Encounter for screening for malignant neoplasm of colon: Secondary | ICD-10-CM | POA: Diagnosis not present

## 2021-03-31 DIAGNOSIS — M179 Osteoarthritis of knee, unspecified: Secondary | ICD-10-CM | POA: Diagnosis not present

## 2021-03-31 DIAGNOSIS — I10 Essential (primary) hypertension: Secondary | ICD-10-CM | POA: Diagnosis not present

## 2021-03-31 DIAGNOSIS — Z1239 Encounter for other screening for malignant neoplasm of breast: Secondary | ICD-10-CM | POA: Diagnosis not present

## 2021-03-31 DIAGNOSIS — R7303 Prediabetes: Secondary | ICD-10-CM | POA: Diagnosis not present

## 2021-03-31 DIAGNOSIS — G894 Chronic pain syndrome: Secondary | ICD-10-CM | POA: Diagnosis not present

## 2021-03-31 DIAGNOSIS — G4700 Insomnia, unspecified: Secondary | ICD-10-CM | POA: Diagnosis not present

## 2021-04-01 DIAGNOSIS — G894 Chronic pain syndrome: Secondary | ICD-10-CM | POA: Diagnosis not present

## 2021-04-02 DIAGNOSIS — G894 Chronic pain syndrome: Secondary | ICD-10-CM | POA: Diagnosis not present

## 2021-04-03 DIAGNOSIS — G894 Chronic pain syndrome: Secondary | ICD-10-CM | POA: Diagnosis not present

## 2021-04-04 DIAGNOSIS — G894 Chronic pain syndrome: Secondary | ICD-10-CM | POA: Diagnosis not present

## 2021-04-05 DIAGNOSIS — G894 Chronic pain syndrome: Secondary | ICD-10-CM | POA: Diagnosis not present

## 2021-04-06 DIAGNOSIS — G894 Chronic pain syndrome: Secondary | ICD-10-CM | POA: Diagnosis not present

## 2021-04-07 DIAGNOSIS — G894 Chronic pain syndrome: Secondary | ICD-10-CM | POA: Diagnosis not present

## 2021-04-08 DIAGNOSIS — G894 Chronic pain syndrome: Secondary | ICD-10-CM | POA: Diagnosis not present

## 2021-04-09 DIAGNOSIS — G894 Chronic pain syndrome: Secondary | ICD-10-CM | POA: Diagnosis not present

## 2021-04-10 DIAGNOSIS — G894 Chronic pain syndrome: Secondary | ICD-10-CM | POA: Diagnosis not present

## 2021-04-11 DIAGNOSIS — G894 Chronic pain syndrome: Secondary | ICD-10-CM | POA: Diagnosis not present

## 2021-04-12 DIAGNOSIS — G894 Chronic pain syndrome: Secondary | ICD-10-CM | POA: Diagnosis not present

## 2021-04-13 DIAGNOSIS — G894 Chronic pain syndrome: Secondary | ICD-10-CM | POA: Diagnosis not present

## 2021-04-14 DIAGNOSIS — G894 Chronic pain syndrome: Secondary | ICD-10-CM | POA: Diagnosis not present

## 2021-04-15 DIAGNOSIS — G894 Chronic pain syndrome: Secondary | ICD-10-CM | POA: Diagnosis not present

## 2021-04-16 DIAGNOSIS — G894 Chronic pain syndrome: Secondary | ICD-10-CM | POA: Diagnosis not present

## 2021-04-17 DIAGNOSIS — G894 Chronic pain syndrome: Secondary | ICD-10-CM | POA: Diagnosis not present

## 2021-04-18 DIAGNOSIS — G894 Chronic pain syndrome: Secondary | ICD-10-CM | POA: Diagnosis not present

## 2021-04-19 DIAGNOSIS — G894 Chronic pain syndrome: Secondary | ICD-10-CM | POA: Diagnosis not present

## 2021-04-20 DIAGNOSIS — G894 Chronic pain syndrome: Secondary | ICD-10-CM | POA: Diagnosis not present

## 2021-04-21 DIAGNOSIS — Z419 Encounter for procedure for purposes other than remedying health state, unspecified: Secondary | ICD-10-CM | POA: Diagnosis not present

## 2021-04-21 DIAGNOSIS — G894 Chronic pain syndrome: Secondary | ICD-10-CM | POA: Diagnosis not present

## 2021-04-22 DIAGNOSIS — G894 Chronic pain syndrome: Secondary | ICD-10-CM | POA: Diagnosis not present

## 2021-04-23 DIAGNOSIS — G894 Chronic pain syndrome: Secondary | ICD-10-CM | POA: Diagnosis not present

## 2021-04-24 DIAGNOSIS — G894 Chronic pain syndrome: Secondary | ICD-10-CM | POA: Diagnosis not present

## 2021-04-25 DIAGNOSIS — G894 Chronic pain syndrome: Secondary | ICD-10-CM | POA: Diagnosis not present

## 2021-04-26 DIAGNOSIS — G894 Chronic pain syndrome: Secondary | ICD-10-CM | POA: Diagnosis not present

## 2021-04-27 DIAGNOSIS — G894 Chronic pain syndrome: Secondary | ICD-10-CM | POA: Diagnosis not present

## 2021-04-28 DIAGNOSIS — G894 Chronic pain syndrome: Secondary | ICD-10-CM | POA: Diagnosis not present

## 2021-04-29 DIAGNOSIS — G894 Chronic pain syndrome: Secondary | ICD-10-CM | POA: Diagnosis not present

## 2021-04-30 DIAGNOSIS — G894 Chronic pain syndrome: Secondary | ICD-10-CM | POA: Diagnosis not present

## 2021-04-30 DIAGNOSIS — M25512 Pain in left shoulder: Secondary | ICD-10-CM | POA: Diagnosis not present

## 2021-05-01 DIAGNOSIS — M170 Bilateral primary osteoarthritis of knee: Secondary | ICD-10-CM | POA: Diagnosis not present

## 2021-05-01 DIAGNOSIS — M25561 Pain in right knee: Secondary | ICD-10-CM | POA: Diagnosis not present

## 2021-05-01 DIAGNOSIS — M5137 Other intervertebral disc degeneration, lumbosacral region: Secondary | ICD-10-CM | POA: Diagnosis not present

## 2021-05-01 DIAGNOSIS — M25512 Pain in left shoulder: Secondary | ICD-10-CM | POA: Diagnosis not present

## 2021-05-01 DIAGNOSIS — M25562 Pain in left knee: Secondary | ICD-10-CM | POA: Diagnosis not present

## 2021-05-01 DIAGNOSIS — M79609 Pain in unspecified limb: Secondary | ICD-10-CM | POA: Diagnosis not present

## 2021-05-01 DIAGNOSIS — M542 Cervicalgia: Secondary | ICD-10-CM | POA: Diagnosis not present

## 2021-05-01 DIAGNOSIS — Z6831 Body mass index (BMI) 31.0-31.9, adult: Secondary | ICD-10-CM | POA: Diagnosis not present

## 2021-05-01 DIAGNOSIS — R0989 Other specified symptoms and signs involving the circulatory and respiratory systems: Secondary | ICD-10-CM | POA: Diagnosis not present

## 2021-05-01 DIAGNOSIS — G894 Chronic pain syndrome: Secondary | ICD-10-CM | POA: Diagnosis not present

## 2021-05-01 DIAGNOSIS — M25511 Pain in right shoulder: Secondary | ICD-10-CM | POA: Diagnosis not present

## 2021-05-02 DIAGNOSIS — G894 Chronic pain syndrome: Secondary | ICD-10-CM | POA: Diagnosis not present

## 2021-05-03 DIAGNOSIS — G894 Chronic pain syndrome: Secondary | ICD-10-CM | POA: Diagnosis not present

## 2021-05-04 DIAGNOSIS — G894 Chronic pain syndrome: Secondary | ICD-10-CM | POA: Diagnosis not present

## 2021-05-05 DIAGNOSIS — G894 Chronic pain syndrome: Secondary | ICD-10-CM | POA: Diagnosis not present

## 2021-05-06 DIAGNOSIS — G894 Chronic pain syndrome: Secondary | ICD-10-CM | POA: Diagnosis not present

## 2021-05-07 DIAGNOSIS — Z4789 Encounter for other orthopedic aftercare: Secondary | ICD-10-CM | POA: Diagnosis not present

## 2021-05-07 DIAGNOSIS — G894 Chronic pain syndrome: Secondary | ICD-10-CM | POA: Diagnosis not present

## 2021-05-08 DIAGNOSIS — G894 Chronic pain syndrome: Secondary | ICD-10-CM | POA: Diagnosis not present

## 2021-05-09 DIAGNOSIS — G894 Chronic pain syndrome: Secondary | ICD-10-CM | POA: Diagnosis not present

## 2021-05-10 DIAGNOSIS — G894 Chronic pain syndrome: Secondary | ICD-10-CM | POA: Diagnosis not present

## 2021-05-11 DIAGNOSIS — G894 Chronic pain syndrome: Secondary | ICD-10-CM | POA: Diagnosis not present

## 2021-05-12 DIAGNOSIS — G894 Chronic pain syndrome: Secondary | ICD-10-CM | POA: Diagnosis not present

## 2021-05-13 DIAGNOSIS — G894 Chronic pain syndrome: Secondary | ICD-10-CM | POA: Diagnosis not present

## 2021-05-14 DIAGNOSIS — G894 Chronic pain syndrome: Secondary | ICD-10-CM | POA: Diagnosis not present

## 2021-05-15 DIAGNOSIS — G894 Chronic pain syndrome: Secondary | ICD-10-CM | POA: Diagnosis not present

## 2021-05-16 DIAGNOSIS — G894 Chronic pain syndrome: Secondary | ICD-10-CM | POA: Diagnosis not present

## 2021-05-17 DIAGNOSIS — G894 Chronic pain syndrome: Secondary | ICD-10-CM | POA: Diagnosis not present

## 2021-05-18 DIAGNOSIS — G894 Chronic pain syndrome: Secondary | ICD-10-CM | POA: Diagnosis not present

## 2021-05-19 DIAGNOSIS — G894 Chronic pain syndrome: Secondary | ICD-10-CM | POA: Diagnosis not present

## 2021-05-20 DIAGNOSIS — G894 Chronic pain syndrome: Secondary | ICD-10-CM | POA: Diagnosis not present

## 2021-05-21 DIAGNOSIS — G894 Chronic pain syndrome: Secondary | ICD-10-CM | POA: Diagnosis not present

## 2021-05-21 DIAGNOSIS — Z419 Encounter for procedure for purposes other than remedying health state, unspecified: Secondary | ICD-10-CM | POA: Diagnosis not present

## 2021-05-22 DIAGNOSIS — G894 Chronic pain syndrome: Secondary | ICD-10-CM | POA: Diagnosis not present

## 2021-05-23 DIAGNOSIS — G894 Chronic pain syndrome: Secondary | ICD-10-CM | POA: Diagnosis not present

## 2021-05-24 DIAGNOSIS — G894 Chronic pain syndrome: Secondary | ICD-10-CM | POA: Diagnosis not present

## 2021-05-25 DIAGNOSIS — G894 Chronic pain syndrome: Secondary | ICD-10-CM | POA: Diagnosis not present

## 2021-05-26 DIAGNOSIS — M25512 Pain in left shoulder: Secondary | ICD-10-CM | POA: Diagnosis not present

## 2021-05-26 DIAGNOSIS — M25562 Pain in left knee: Secondary | ICD-10-CM | POA: Diagnosis not present

## 2021-05-26 DIAGNOSIS — M25561 Pain in right knee: Secondary | ICD-10-CM | POA: Diagnosis not present

## 2021-05-26 DIAGNOSIS — M5137 Other intervertebral disc degeneration, lumbosacral region: Secondary | ICD-10-CM | POA: Diagnosis not present

## 2021-05-26 DIAGNOSIS — R519 Headache, unspecified: Secondary | ICD-10-CM | POA: Diagnosis not present

## 2021-05-26 DIAGNOSIS — G894 Chronic pain syndrome: Secondary | ICD-10-CM | POA: Diagnosis not present

## 2021-05-26 DIAGNOSIS — M170 Bilateral primary osteoarthritis of knee: Secondary | ICD-10-CM | POA: Diagnosis not present

## 2021-05-26 DIAGNOSIS — G629 Polyneuropathy, unspecified: Secondary | ICD-10-CM | POA: Diagnosis not present

## 2021-05-26 DIAGNOSIS — R55 Syncope and collapse: Secondary | ICD-10-CM | POA: Diagnosis not present

## 2021-05-26 DIAGNOSIS — M25511 Pain in right shoulder: Secondary | ICD-10-CM | POA: Diagnosis not present

## 2021-05-26 DIAGNOSIS — M545 Low back pain, unspecified: Secondary | ICD-10-CM | POA: Diagnosis not present

## 2021-05-27 DIAGNOSIS — G894 Chronic pain syndrome: Secondary | ICD-10-CM | POA: Diagnosis not present

## 2021-05-28 DIAGNOSIS — G894 Chronic pain syndrome: Secondary | ICD-10-CM | POA: Diagnosis not present

## 2021-05-29 DIAGNOSIS — G894 Chronic pain syndrome: Secondary | ICD-10-CM | POA: Diagnosis not present

## 2021-05-30 DIAGNOSIS — G894 Chronic pain syndrome: Secondary | ICD-10-CM | POA: Diagnosis not present

## 2021-05-31 DIAGNOSIS — G894 Chronic pain syndrome: Secondary | ICD-10-CM | POA: Diagnosis not present

## 2021-06-01 DIAGNOSIS — G894 Chronic pain syndrome: Secondary | ICD-10-CM | POA: Diagnosis not present

## 2021-06-02 DIAGNOSIS — G894 Chronic pain syndrome: Secondary | ICD-10-CM | POA: Diagnosis not present

## 2021-06-03 DIAGNOSIS — G4489 Other headache syndrome: Secondary | ICD-10-CM | POA: Diagnosis not present

## 2021-06-03 DIAGNOSIS — G43909 Migraine, unspecified, not intractable, without status migrainosus: Secondary | ICD-10-CM | POA: Diagnosis not present

## 2021-06-03 DIAGNOSIS — G894 Chronic pain syndrome: Secondary | ICD-10-CM | POA: Diagnosis not present

## 2021-06-03 DIAGNOSIS — R404 Transient alteration of awareness: Secondary | ICD-10-CM | POA: Diagnosis not present

## 2021-06-03 DIAGNOSIS — R6889 Other general symptoms and signs: Secondary | ICD-10-CM | POA: Diagnosis not present

## 2021-06-04 DIAGNOSIS — G894 Chronic pain syndrome: Secondary | ICD-10-CM | POA: Diagnosis not present

## 2021-06-05 DIAGNOSIS — G894 Chronic pain syndrome: Secondary | ICD-10-CM | POA: Diagnosis not present

## 2021-06-06 DIAGNOSIS — G894 Chronic pain syndrome: Secondary | ICD-10-CM | POA: Diagnosis not present

## 2021-06-07 DIAGNOSIS — G894 Chronic pain syndrome: Secondary | ICD-10-CM | POA: Diagnosis not present

## 2021-06-08 DIAGNOSIS — G894 Chronic pain syndrome: Secondary | ICD-10-CM | POA: Diagnosis not present

## 2021-06-09 DIAGNOSIS — Z4789 Encounter for other orthopedic aftercare: Secondary | ICD-10-CM | POA: Diagnosis not present

## 2021-06-09 DIAGNOSIS — G894 Chronic pain syndrome: Secondary | ICD-10-CM | POA: Diagnosis not present

## 2021-06-10 DIAGNOSIS — G894 Chronic pain syndrome: Secondary | ICD-10-CM | POA: Diagnosis not present

## 2021-06-11 DIAGNOSIS — G894 Chronic pain syndrome: Secondary | ICD-10-CM | POA: Diagnosis not present

## 2021-06-12 DIAGNOSIS — G894 Chronic pain syndrome: Secondary | ICD-10-CM | POA: Diagnosis not present

## 2021-06-13 DIAGNOSIS — G894 Chronic pain syndrome: Secondary | ICD-10-CM | POA: Diagnosis not present

## 2021-06-14 DIAGNOSIS — G894 Chronic pain syndrome: Secondary | ICD-10-CM | POA: Diagnosis not present

## 2021-06-15 DIAGNOSIS — G894 Chronic pain syndrome: Secondary | ICD-10-CM | POA: Diagnosis not present

## 2021-06-15 DIAGNOSIS — Z1212 Encounter for screening for malignant neoplasm of rectum: Secondary | ICD-10-CM | POA: Diagnosis not present

## 2021-06-15 DIAGNOSIS — Z1211 Encounter for screening for malignant neoplasm of colon: Secondary | ICD-10-CM | POA: Diagnosis not present

## 2021-06-16 DIAGNOSIS — G894 Chronic pain syndrome: Secondary | ICD-10-CM | POA: Diagnosis not present

## 2021-06-17 DIAGNOSIS — G894 Chronic pain syndrome: Secondary | ICD-10-CM | POA: Diagnosis not present

## 2021-06-18 DIAGNOSIS — G894 Chronic pain syndrome: Secondary | ICD-10-CM | POA: Diagnosis not present

## 2021-06-19 DIAGNOSIS — G894 Chronic pain syndrome: Secondary | ICD-10-CM | POA: Diagnosis not present

## 2021-06-20 DIAGNOSIS — G894 Chronic pain syndrome: Secondary | ICD-10-CM | POA: Diagnosis not present

## 2021-06-21 DIAGNOSIS — G894 Chronic pain syndrome: Secondary | ICD-10-CM | POA: Diagnosis not present

## 2021-06-21 DIAGNOSIS — Z419 Encounter for procedure for purposes other than remedying health state, unspecified: Secondary | ICD-10-CM | POA: Diagnosis not present

## 2021-06-22 DIAGNOSIS — G894 Chronic pain syndrome: Secondary | ICD-10-CM | POA: Diagnosis not present

## 2021-06-23 DIAGNOSIS — Z4789 Encounter for other orthopedic aftercare: Secondary | ICD-10-CM | POA: Diagnosis not present

## 2021-06-23 DIAGNOSIS — G894 Chronic pain syndrome: Secondary | ICD-10-CM | POA: Diagnosis not present

## 2021-06-24 DIAGNOSIS — G894 Chronic pain syndrome: Secondary | ICD-10-CM | POA: Diagnosis not present

## 2021-06-25 DIAGNOSIS — G894 Chronic pain syndrome: Secondary | ICD-10-CM | POA: Diagnosis not present

## 2021-06-25 LAB — COLOGUARD: COLOGUARD: NEGATIVE

## 2021-06-26 DIAGNOSIS — G894 Chronic pain syndrome: Secondary | ICD-10-CM | POA: Diagnosis not present

## 2021-06-27 DIAGNOSIS — G894 Chronic pain syndrome: Secondary | ICD-10-CM | POA: Diagnosis not present

## 2021-06-28 DIAGNOSIS — G894 Chronic pain syndrome: Secondary | ICD-10-CM | POA: Diagnosis not present

## 2021-07-07 DIAGNOSIS — M79609 Pain in unspecified limb: Secondary | ICD-10-CM | POA: Diagnosis not present

## 2021-07-07 DIAGNOSIS — R519 Headache, unspecified: Secondary | ICD-10-CM | POA: Diagnosis not present

## 2021-07-07 DIAGNOSIS — M542 Cervicalgia: Secondary | ICD-10-CM | POA: Diagnosis not present

## 2021-07-07 DIAGNOSIS — M170 Bilateral primary osteoarthritis of knee: Secondary | ICD-10-CM | POA: Diagnosis not present

## 2021-07-07 DIAGNOSIS — R55 Syncope and collapse: Secondary | ICD-10-CM | POA: Diagnosis not present

## 2021-07-07 DIAGNOSIS — M25562 Pain in left knee: Secondary | ICD-10-CM | POA: Diagnosis not present

## 2021-07-07 DIAGNOSIS — R0989 Other specified symptoms and signs involving the circulatory and respiratory systems: Secondary | ICD-10-CM | POA: Diagnosis not present

## 2021-07-07 DIAGNOSIS — M25561 Pain in right knee: Secondary | ICD-10-CM | POA: Diagnosis not present

## 2021-07-07 DIAGNOSIS — M25511 Pain in right shoulder: Secondary | ICD-10-CM | POA: Diagnosis not present

## 2021-07-07 DIAGNOSIS — G894 Chronic pain syndrome: Secondary | ICD-10-CM | POA: Diagnosis not present

## 2021-07-07 DIAGNOSIS — M25512 Pain in left shoulder: Secondary | ICD-10-CM | POA: Diagnosis not present

## 2021-07-08 DIAGNOSIS — Z4789 Encounter for other orthopedic aftercare: Secondary | ICD-10-CM | POA: Diagnosis not present

## 2021-07-09 DIAGNOSIS — Z96659 Presence of unspecified artificial knee joint: Secondary | ICD-10-CM | POA: Diagnosis not present

## 2021-07-09 DIAGNOSIS — M6281 Muscle weakness (generalized): Secondary | ICD-10-CM | POA: Diagnosis not present

## 2021-07-20 DIAGNOSIS — G894 Chronic pain syndrome: Secondary | ICD-10-CM | POA: Diagnosis not present

## 2021-07-21 DIAGNOSIS — G894 Chronic pain syndrome: Secondary | ICD-10-CM | POA: Diagnosis not present

## 2021-07-22 DIAGNOSIS — M6281 Muscle weakness (generalized): Secondary | ICD-10-CM | POA: Diagnosis not present

## 2021-07-22 DIAGNOSIS — Z419 Encounter for procedure for purposes other than remedying health state, unspecified: Secondary | ICD-10-CM | POA: Diagnosis not present

## 2021-07-22 DIAGNOSIS — Z96659 Presence of unspecified artificial knee joint: Secondary | ICD-10-CM | POA: Diagnosis not present

## 2021-07-22 DIAGNOSIS — G894 Chronic pain syndrome: Secondary | ICD-10-CM | POA: Diagnosis not present

## 2021-07-23 DIAGNOSIS — G894 Chronic pain syndrome: Secondary | ICD-10-CM | POA: Diagnosis not present

## 2021-07-24 DIAGNOSIS — G894 Chronic pain syndrome: Secondary | ICD-10-CM | POA: Diagnosis not present

## 2021-07-25 DIAGNOSIS — G894 Chronic pain syndrome: Secondary | ICD-10-CM | POA: Diagnosis not present

## 2021-07-26 DIAGNOSIS — G894 Chronic pain syndrome: Secondary | ICD-10-CM | POA: Diagnosis not present

## 2021-07-27 DIAGNOSIS — G894 Chronic pain syndrome: Secondary | ICD-10-CM | POA: Diagnosis not present

## 2021-07-28 DIAGNOSIS — R55 Syncope and collapse: Secondary | ICD-10-CM | POA: Diagnosis not present

## 2021-07-28 DIAGNOSIS — M79609 Pain in unspecified limb: Secondary | ICD-10-CM | POA: Diagnosis not present

## 2021-07-28 DIAGNOSIS — M542 Cervicalgia: Secondary | ICD-10-CM | POA: Diagnosis not present

## 2021-07-28 DIAGNOSIS — M545 Low back pain, unspecified: Secondary | ICD-10-CM | POA: Diagnosis not present

## 2021-07-28 DIAGNOSIS — M25561 Pain in right knee: Secondary | ICD-10-CM | POA: Diagnosis not present

## 2021-07-28 DIAGNOSIS — Z6831 Body mass index (BMI) 31.0-31.9, adult: Secondary | ICD-10-CM | POA: Diagnosis not present

## 2021-07-28 DIAGNOSIS — S134XXA Sprain of ligaments of cervical spine, initial encounter: Secondary | ICD-10-CM | POA: Diagnosis not present

## 2021-07-28 DIAGNOSIS — M25512 Pain in left shoulder: Secondary | ICD-10-CM | POA: Diagnosis not present

## 2021-07-28 DIAGNOSIS — R519 Headache, unspecified: Secondary | ICD-10-CM | POA: Diagnosis not present

## 2021-07-28 DIAGNOSIS — G894 Chronic pain syndrome: Secondary | ICD-10-CM | POA: Diagnosis not present

## 2021-07-28 DIAGNOSIS — M25562 Pain in left knee: Secondary | ICD-10-CM | POA: Diagnosis not present

## 2021-07-28 DIAGNOSIS — M170 Bilateral primary osteoarthritis of knee: Secondary | ICD-10-CM | POA: Diagnosis not present

## 2021-07-29 DIAGNOSIS — R55 Syncope and collapse: Secondary | ICD-10-CM | POA: Diagnosis not present

## 2021-07-29 DIAGNOSIS — Z96659 Presence of unspecified artificial knee joint: Secondary | ICD-10-CM | POA: Diagnosis not present

## 2021-07-29 DIAGNOSIS — G43909 Migraine, unspecified, not intractable, without status migrainosus: Secondary | ICD-10-CM | POA: Diagnosis not present

## 2021-07-29 DIAGNOSIS — Z743 Need for continuous supervision: Secondary | ICD-10-CM | POA: Diagnosis not present

## 2021-07-29 DIAGNOSIS — M6281 Muscle weakness (generalized): Secondary | ICD-10-CM | POA: Diagnosis not present

## 2021-07-29 DIAGNOSIS — G894 Chronic pain syndrome: Secondary | ICD-10-CM | POA: Diagnosis not present

## 2021-07-29 DIAGNOSIS — I10 Essential (primary) hypertension: Secondary | ICD-10-CM | POA: Diagnosis not present

## 2021-07-30 DIAGNOSIS — G894 Chronic pain syndrome: Secondary | ICD-10-CM | POA: Diagnosis not present

## 2021-07-31 DIAGNOSIS — G894 Chronic pain syndrome: Secondary | ICD-10-CM | POA: Diagnosis not present

## 2021-08-01 DIAGNOSIS — G894 Chronic pain syndrome: Secondary | ICD-10-CM | POA: Diagnosis not present

## 2021-08-02 DIAGNOSIS — G894 Chronic pain syndrome: Secondary | ICD-10-CM | POA: Diagnosis not present

## 2021-08-03 DIAGNOSIS — G894 Chronic pain syndrome: Secondary | ICD-10-CM | POA: Diagnosis not present

## 2021-08-03 DIAGNOSIS — M6281 Muscle weakness (generalized): Secondary | ICD-10-CM | POA: Diagnosis not present

## 2021-08-03 DIAGNOSIS — Z96659 Presence of unspecified artificial knee joint: Secondary | ICD-10-CM | POA: Diagnosis not present

## 2021-08-04 DIAGNOSIS — G894 Chronic pain syndrome: Secondary | ICD-10-CM | POA: Diagnosis not present

## 2021-08-05 DIAGNOSIS — R7303 Prediabetes: Secondary | ICD-10-CM | POA: Diagnosis not present

## 2021-08-05 DIAGNOSIS — K219 Gastro-esophageal reflux disease without esophagitis: Secondary | ICD-10-CM | POA: Diagnosis not present

## 2021-08-05 DIAGNOSIS — I10 Essential (primary) hypertension: Secondary | ICD-10-CM | POA: Diagnosis not present

## 2021-08-05 DIAGNOSIS — G894 Chronic pain syndrome: Secondary | ICD-10-CM | POA: Diagnosis not present

## 2021-08-05 DIAGNOSIS — Z0000 Encounter for general adult medical examination without abnormal findings: Secondary | ICD-10-CM | POA: Diagnosis not present

## 2021-08-05 DIAGNOSIS — M179 Osteoarthritis of knee, unspecified: Secondary | ICD-10-CM | POA: Diagnosis not present

## 2021-08-05 DIAGNOSIS — G43909 Migraine, unspecified, not intractable, without status migrainosus: Secondary | ICD-10-CM | POA: Diagnosis not present

## 2021-08-06 DIAGNOSIS — G894 Chronic pain syndrome: Secondary | ICD-10-CM | POA: Diagnosis not present

## 2021-08-07 DIAGNOSIS — G894 Chronic pain syndrome: Secondary | ICD-10-CM | POA: Diagnosis not present

## 2021-08-08 DIAGNOSIS — G894 Chronic pain syndrome: Secondary | ICD-10-CM | POA: Diagnosis not present

## 2021-08-09 DIAGNOSIS — G894 Chronic pain syndrome: Secondary | ICD-10-CM | POA: Diagnosis not present

## 2021-08-10 DIAGNOSIS — M6281 Muscle weakness (generalized): Secondary | ICD-10-CM | POA: Diagnosis not present

## 2021-08-10 DIAGNOSIS — M25562 Pain in left knee: Secondary | ICD-10-CM | POA: Diagnosis not present

## 2021-08-10 DIAGNOSIS — G894 Chronic pain syndrome: Secondary | ICD-10-CM | POA: Diagnosis not present

## 2021-08-11 DIAGNOSIS — G894 Chronic pain syndrome: Secondary | ICD-10-CM | POA: Diagnosis not present

## 2021-08-12 DIAGNOSIS — G894 Chronic pain syndrome: Secondary | ICD-10-CM | POA: Diagnosis not present

## 2021-08-13 DIAGNOSIS — G501 Atypical facial pain: Secondary | ICD-10-CM | POA: Diagnosis not present

## 2021-08-13 DIAGNOSIS — M25562 Pain in left knee: Secondary | ICD-10-CM | POA: Diagnosis not present

## 2021-08-13 DIAGNOSIS — M5137 Other intervertebral disc degeneration, lumbosacral region: Secondary | ICD-10-CM | POA: Diagnosis not present

## 2021-08-13 DIAGNOSIS — R55 Syncope and collapse: Secondary | ICD-10-CM | POA: Diagnosis not present

## 2021-08-13 DIAGNOSIS — M542 Cervicalgia: Secondary | ICD-10-CM | POA: Diagnosis not present

## 2021-08-13 DIAGNOSIS — R0989 Other specified symptoms and signs involving the circulatory and respiratory systems: Secondary | ICD-10-CM | POA: Diagnosis not present

## 2021-08-13 DIAGNOSIS — M25561 Pain in right knee: Secondary | ICD-10-CM | POA: Diagnosis not present

## 2021-08-13 DIAGNOSIS — M170 Bilateral primary osteoarthritis of knee: Secondary | ICD-10-CM | POA: Diagnosis not present

## 2021-08-13 DIAGNOSIS — G894 Chronic pain syndrome: Secondary | ICD-10-CM | POA: Diagnosis not present

## 2021-08-13 DIAGNOSIS — M25512 Pain in left shoulder: Secondary | ICD-10-CM | POA: Diagnosis not present

## 2021-08-13 DIAGNOSIS — M25511 Pain in right shoulder: Secondary | ICD-10-CM | POA: Diagnosis not present

## 2021-08-14 DIAGNOSIS — G894 Chronic pain syndrome: Secondary | ICD-10-CM | POA: Diagnosis not present

## 2021-08-15 DIAGNOSIS — G894 Chronic pain syndrome: Secondary | ICD-10-CM | POA: Diagnosis not present

## 2021-08-16 DIAGNOSIS — G894 Chronic pain syndrome: Secondary | ICD-10-CM | POA: Diagnosis not present

## 2021-08-17 DIAGNOSIS — G894 Chronic pain syndrome: Secondary | ICD-10-CM | POA: Diagnosis not present

## 2021-08-18 DIAGNOSIS — G894 Chronic pain syndrome: Secondary | ICD-10-CM | POA: Diagnosis not present

## 2021-08-18 DIAGNOSIS — Z4789 Encounter for other orthopedic aftercare: Secondary | ICD-10-CM | POA: Diagnosis not present

## 2021-08-19 DIAGNOSIS — I10 Essential (primary) hypertension: Secondary | ICD-10-CM | POA: Diagnosis not present

## 2021-08-19 DIAGNOSIS — G43909 Migraine, unspecified, not intractable, without status migrainosus: Secondary | ICD-10-CM | POA: Diagnosis not present

## 2021-08-19 DIAGNOSIS — M179 Osteoarthritis of knee, unspecified: Secondary | ICD-10-CM | POA: Diagnosis not present

## 2021-08-19 DIAGNOSIS — G894 Chronic pain syndrome: Secondary | ICD-10-CM | POA: Diagnosis not present

## 2021-08-19 DIAGNOSIS — Z419 Encounter for procedure for purposes other than remedying health state, unspecified: Secondary | ICD-10-CM | POA: Diagnosis not present

## 2021-08-20 DIAGNOSIS — G894 Chronic pain syndrome: Secondary | ICD-10-CM | POA: Diagnosis not present

## 2021-08-21 DIAGNOSIS — G894 Chronic pain syndrome: Secondary | ICD-10-CM | POA: Diagnosis not present

## 2021-08-22 DIAGNOSIS — G894 Chronic pain syndrome: Secondary | ICD-10-CM | POA: Diagnosis not present

## 2021-08-23 DIAGNOSIS — G894 Chronic pain syndrome: Secondary | ICD-10-CM | POA: Diagnosis not present

## 2021-08-24 DIAGNOSIS — G894 Chronic pain syndrome: Secondary | ICD-10-CM | POA: Diagnosis not present

## 2021-08-25 DIAGNOSIS — G894 Chronic pain syndrome: Secondary | ICD-10-CM | POA: Diagnosis not present

## 2021-08-26 DIAGNOSIS — G894 Chronic pain syndrome: Secondary | ICD-10-CM | POA: Diagnosis not present

## 2021-08-27 DIAGNOSIS — G894 Chronic pain syndrome: Secondary | ICD-10-CM | POA: Diagnosis not present

## 2021-08-28 DIAGNOSIS — G894 Chronic pain syndrome: Secondary | ICD-10-CM | POA: Diagnosis not present

## 2021-08-29 DIAGNOSIS — G894 Chronic pain syndrome: Secondary | ICD-10-CM | POA: Diagnosis not present

## 2021-08-30 DIAGNOSIS — G894 Chronic pain syndrome: Secondary | ICD-10-CM | POA: Diagnosis not present

## 2021-08-31 DIAGNOSIS — G894 Chronic pain syndrome: Secondary | ICD-10-CM | POA: Diagnosis not present

## 2021-08-31 DIAGNOSIS — M25562 Pain in left knee: Secondary | ICD-10-CM | POA: Diagnosis not present

## 2021-08-31 DIAGNOSIS — M6281 Muscle weakness (generalized): Secondary | ICD-10-CM | POA: Diagnosis not present

## 2021-09-01 DIAGNOSIS — G894 Chronic pain syndrome: Secondary | ICD-10-CM | POA: Diagnosis not present

## 2021-09-02 DIAGNOSIS — G894 Chronic pain syndrome: Secondary | ICD-10-CM | POA: Diagnosis not present

## 2021-09-03 DIAGNOSIS — G894 Chronic pain syndrome: Secondary | ICD-10-CM | POA: Diagnosis not present

## 2021-09-04 DIAGNOSIS — G894 Chronic pain syndrome: Secondary | ICD-10-CM | POA: Diagnosis not present

## 2021-09-05 DIAGNOSIS — G894 Chronic pain syndrome: Secondary | ICD-10-CM | POA: Diagnosis not present

## 2021-09-06 DIAGNOSIS — G894 Chronic pain syndrome: Secondary | ICD-10-CM | POA: Diagnosis not present

## 2021-09-07 DIAGNOSIS — G894 Chronic pain syndrome: Secondary | ICD-10-CM | POA: Diagnosis not present

## 2021-09-08 DIAGNOSIS — G894 Chronic pain syndrome: Secondary | ICD-10-CM | POA: Diagnosis not present

## 2021-09-09 DIAGNOSIS — G894 Chronic pain syndrome: Secondary | ICD-10-CM | POA: Diagnosis not present

## 2021-09-10 DIAGNOSIS — M25562 Pain in left knee: Secondary | ICD-10-CM | POA: Diagnosis not present

## 2021-09-10 DIAGNOSIS — M545 Low back pain, unspecified: Secondary | ICD-10-CM | POA: Diagnosis not present

## 2021-09-10 DIAGNOSIS — M542 Cervicalgia: Secondary | ICD-10-CM | POA: Diagnosis not present

## 2021-09-10 DIAGNOSIS — M5137 Other intervertebral disc degeneration, lumbosacral region: Secondary | ICD-10-CM | POA: Diagnosis not present

## 2021-09-10 DIAGNOSIS — G629 Polyneuropathy, unspecified: Secondary | ICD-10-CM | POA: Diagnosis not present

## 2021-09-10 DIAGNOSIS — M25511 Pain in right shoulder: Secondary | ICD-10-CM | POA: Diagnosis not present

## 2021-09-10 DIAGNOSIS — Z96652 Presence of left artificial knee joint: Secondary | ICD-10-CM | POA: Diagnosis not present

## 2021-09-10 DIAGNOSIS — M170 Bilateral primary osteoarthritis of knee: Secondary | ICD-10-CM | POA: Diagnosis not present

## 2021-09-10 DIAGNOSIS — M25561 Pain in right knee: Secondary | ICD-10-CM | POA: Diagnosis not present

## 2021-09-10 DIAGNOSIS — M79609 Pain in unspecified limb: Secondary | ICD-10-CM | POA: Diagnosis not present

## 2021-09-10 DIAGNOSIS — M25512 Pain in left shoulder: Secondary | ICD-10-CM | POA: Diagnosis not present

## 2021-09-10 DIAGNOSIS — M6281 Muscle weakness (generalized): Secondary | ICD-10-CM | POA: Diagnosis not present

## 2021-09-10 DIAGNOSIS — G894 Chronic pain syndrome: Secondary | ICD-10-CM | POA: Diagnosis not present

## 2021-09-11 DIAGNOSIS — G894 Chronic pain syndrome: Secondary | ICD-10-CM | POA: Diagnosis not present

## 2021-09-12 DIAGNOSIS — G894 Chronic pain syndrome: Secondary | ICD-10-CM | POA: Diagnosis not present

## 2021-09-13 DIAGNOSIS — G894 Chronic pain syndrome: Secondary | ICD-10-CM | POA: Diagnosis not present

## 2021-09-14 DIAGNOSIS — G894 Chronic pain syndrome: Secondary | ICD-10-CM | POA: Diagnosis not present

## 2021-09-15 DIAGNOSIS — G894 Chronic pain syndrome: Secondary | ICD-10-CM | POA: Diagnosis not present

## 2021-09-16 DIAGNOSIS — G894 Chronic pain syndrome: Secondary | ICD-10-CM | POA: Diagnosis not present

## 2021-09-17 DIAGNOSIS — G894 Chronic pain syndrome: Secondary | ICD-10-CM | POA: Diagnosis not present

## 2021-09-18 DIAGNOSIS — G894 Chronic pain syndrome: Secondary | ICD-10-CM | POA: Diagnosis not present

## 2021-09-19 DIAGNOSIS — Z419 Encounter for procedure for purposes other than remedying health state, unspecified: Secondary | ICD-10-CM | POA: Diagnosis not present

## 2021-09-22 DIAGNOSIS — Z96652 Presence of left artificial knee joint: Secondary | ICD-10-CM | POA: Diagnosis not present

## 2021-09-22 DIAGNOSIS — M6281 Muscle weakness (generalized): Secondary | ICD-10-CM | POA: Diagnosis not present

## 2021-09-22 DIAGNOSIS — M25562 Pain in left knee: Secondary | ICD-10-CM | POA: Diagnosis not present

## 2021-09-24 DIAGNOSIS — M25512 Pain in left shoulder: Secondary | ICD-10-CM | POA: Diagnosis not present

## 2021-10-06 DIAGNOSIS — M25562 Pain in left knee: Secondary | ICD-10-CM | POA: Diagnosis not present

## 2021-10-06 DIAGNOSIS — M25561 Pain in right knee: Secondary | ICD-10-CM | POA: Diagnosis not present

## 2021-10-06 DIAGNOSIS — R519 Headache, unspecified: Secondary | ICD-10-CM | POA: Diagnosis not present

## 2021-10-06 DIAGNOSIS — M25512 Pain in left shoulder: Secondary | ICD-10-CM | POA: Diagnosis not present

## 2021-10-06 DIAGNOSIS — G894 Chronic pain syndrome: Secondary | ICD-10-CM | POA: Diagnosis not present

## 2021-10-06 DIAGNOSIS — R0989 Other specified symptoms and signs involving the circulatory and respiratory systems: Secondary | ICD-10-CM | POA: Diagnosis not present

## 2021-10-06 DIAGNOSIS — R55 Syncope and collapse: Secondary | ICD-10-CM | POA: Diagnosis not present

## 2021-10-06 DIAGNOSIS — M542 Cervicalgia: Secondary | ICD-10-CM | POA: Diagnosis not present

## 2021-10-06 DIAGNOSIS — M79609 Pain in unspecified limb: Secondary | ICD-10-CM | POA: Diagnosis not present

## 2021-10-06 DIAGNOSIS — M545 Low back pain, unspecified: Secondary | ICD-10-CM | POA: Diagnosis not present

## 2021-10-06 DIAGNOSIS — E1049 Type 1 diabetes mellitus with other diabetic neurological complication: Secondary | ICD-10-CM | POA: Diagnosis not present

## 2021-10-06 DIAGNOSIS — M25511 Pain in right shoulder: Secondary | ICD-10-CM | POA: Diagnosis not present

## 2021-10-19 DIAGNOSIS — Z419 Encounter for procedure for purposes other than remedying health state, unspecified: Secondary | ICD-10-CM | POA: Diagnosis not present

## 2021-10-21 DIAGNOSIS — Z96652 Presence of left artificial knee joint: Secondary | ICD-10-CM | POA: Diagnosis not present

## 2021-10-21 DIAGNOSIS — M6281 Muscle weakness (generalized): Secondary | ICD-10-CM | POA: Diagnosis not present

## 2021-10-21 DIAGNOSIS — M25562 Pain in left knee: Secondary | ICD-10-CM | POA: Diagnosis not present

## 2021-10-22 ENCOUNTER — Ambulatory Visit (INDEPENDENT_AMBULATORY_CARE_PROVIDER_SITE_OTHER): Payer: Medicaid Other | Admitting: Plastic Surgery

## 2021-10-22 ENCOUNTER — Encounter: Payer: Self-pay | Admitting: Plastic Surgery

## 2021-10-22 VITALS — BP 141/79 | HR 69 | Ht 65.0 in | Wt 194.2 lb

## 2021-10-22 DIAGNOSIS — L989 Disorder of the skin and subcutaneous tissue, unspecified: Secondary | ICD-10-CM

## 2021-10-22 NOTE — Addendum Note (Signed)
Addended by: Verdie Shire on: 10/22/2021 03:09 PM ? ? Modules accepted: Orders ? ?

## 2021-10-22 NOTE — Progress Notes (Signed)
? ?Referring Provider ?Derrel Nip, MD ?[address] ?[place],  [place] [address]  ? ?CC:  ?Chief Complaint  ?Patient presents with  ? Consult  ?   ?  ?   ? ?Kirsten Walsh is an 61 y.o. female.  ?HPI: Patient presents to discuss several subcutaneous lesions that bother her.  There is 1 in the left AC fossa 1 on the right knee and 1 on the right anterior tibial area.  They been present for a number of years.  They are intermittently painful.  She is worried that they are something abnormal.  She would like to have them removed. ? ?Allergies  ?Allergen Reactions  ? Apple Juice Anaphylaxis and Swelling  ? Aspirin Anaphylaxis and Swelling  ? Daucus Carota Anaphylaxis and Swelling  ? Haloperidol And Related Anaphylaxis, Swelling and Other (See Comments)  ?  Lock jaw and slurred speech  ? ? ?Outpatient Encounter Medications as of 10/22/2021  ?Medication Sig  ? amitriptyline (ELAVIL) 25 MG tablet Take 25 mg by mouth at bedtime as needed for sleep.  ? apixaban (ELIQUIS) 2.5 MG TABS tablet Take 1 tablet (2.5 mg total) by mouth every 12 (twelve) hours.  ? Butalbital-APAP-Caffeine 50-325-40 MG capsule Take 1 capsule by mouth every 6 (six) hours as needed for headache (migraine).  ? cetirizine (ZYRTEC) 10 MG tablet Take 10 mg by mouth daily as needed for allergies.  ? cloNIDine (CATAPRES) 0.1 MG tablet TAKE 1 TABLET(0.1 MG) BY MOUTH TWICE DAILY  ? Diclofenac Sodium 3 % GEL Apply 1 application topically 2 (two) times daily as needed (pain).  ? EPINEPHrine 0.3 mg/0.3 mL IJ SOAJ injection Inject 0.3 mg into the muscle as needed for anaphylaxis.  ? fluticasone (FLONASE) 50 MCG/ACT nasal spray Place 1 spray into both nostrils daily as needed for allergies or rhinitis.  ? gabapentin (NEURONTIN) 300 MG capsule Take 300 mg by mouth 2 (two) times daily.  ? hydrochlorothiazide (HYDRODIURIL) 25 MG tablet TAKE 1 TABLET(25 MG) BY MOUTH DAILY  ? HYDROmorphone (DILAUDID) 2 MG tablet Take 1 tablet (2 mg total) by mouth every 4 (four)  hours as needed for severe pain.  ? methocarbamol (ROBAXIN) 500 MG tablet Take 1 tablet (500 mg total) by mouth every 6 (six) hours as needed.  ? ondansetron (ZOFRAN) 4 MG tablet Take 4 mg by mouth 3 (three) times daily as needed for nausea or vomiting.  ? ondansetron (ZOFRAN) 4 MG tablet Take 1 tablet (4 mg total) by mouth daily as needed for nausea or vomiting.  ? oxyCODONE (ROXICODONE) 15 MG immediate release tablet Take 15 mg by mouth every 12 (twelve) hours.  ? pantoprazole (PROTONIX) 40 MG tablet Take 40 mg by mouth in the morning.  ? Phendimetrazine Tartrate 105 MG CP24 Take 105 mg by mouth every morning. Last dose approx 12/22/20 per pt  ? polyethylene glycol (MIRALAX / GLYCOLAX) 17 g packet Take 17 g by mouth daily.  ? potassium chloride (KLOR-CON) 10 MEQ tablet Take 2 tablets (20 mEq total) by mouth daily.  ? promethazine (PHENERGAN) 25 MG suppository Place 1 suppository (25 mg total) rectally every 8 (eight) hours as needed for up to 6 doses for nausea or vomiting.  ? promethazine (PHENERGAN) 25 MG tablet Take 1 tablet (25 mg total) by mouth every 6 (six) hours as needed for nausea or vomiting.  ? SUMAtriptan (IMITREX) 100 MG tablet Take 100 mg by mouth every 2 (two) hours as needed for migraine. May repeat in 2 hours if  headache persists or recurs.  ? topiramate (TOPAMAX) 100 MG tablet TAKE 1 AND 1/2 TABLETS(150 MG) BY MOUTH TWICE DAILY  ? traZODone (DESYREL) 50 MG tablet Take 50 mg by mouth at bedtime.  ? Vitamin D, Ergocalciferol, (DRISDOL) 1.25 MG (50000 UNIT) CAPS capsule Take 50,000 Units by mouth every Monday.  ? [DISCONTINUED] potassium chloride SA (KLOR-CON) 20 MEQ tablet Take 1 tablet (20 mEq total) by mouth daily for 3 days.  ? ?No facility-administered encounter medications on file as of 10/22/2021.  ?  ? ?Past Medical History:  ?Diagnosis Date  ? Arthritis   ? Chest pain   ? Chronic lower back pain   ? "right side to mid back" (07/06/2017)  ? Family history of adverse reaction to anesthesia   ?  "daughter did; not sure happened"  ? Gall stones   ? History of anaphylaxis 06/26/2014  ? History of kidney stones   ? Hypertension   ? Migraine   ? "qd" (07/06/2017)  ? Syncope and collapse 07/06/2017  ?  sitting on the commode; developed nausea and passed out; hit head on shower and suffered a laceration; woke up in "a pool of blood"  ? ? ?Past Surgical History:  ?Procedure Laterality Date  ? CHONDROPLASTY Left 08/17/2019  ? Procedure: CHONDROPLASTY;  Surgeon: Frederico Hamman, MD;  Location: [facility];  Service: Orthopedics;  Laterality: Left;  ? CYSTOSCOPY W/ STONE MANIPULATION  ~ 2006  ? INTRAUTERINE DEVICE INSERTION    ? "initially put in in 04/2002; changed prn" (02/14/2013)  ? KNEE ARTHROSCOPY WITH LATERAL MENISECTOMY  08/17/2019  ? Procedure: KNEE ARTHROSCOPY WITH LATERAL MENISECTOMY;  Surgeon: Frederico Hamman, MD;  Location: [facility];  Service: Orthopedics;;  ? KNEE ARTHROSCOPY WITH MEDIAL MENISECTOMY Left 08/17/2019  ? Procedure: KNEE ARTHROSCOPY WITH MEDIAL MENISECTOMY;  Surgeon: Frederico Hamman, MD;  Location: [facility];  Service: Orthopedics;  Laterality: Left;  ? LAPAROSCOPIC CHOLECYSTECTOMY  ~ 2004  ? TOTAL KNEE ARTHROPLASTY Left 01/16/2021  ? Procedure: TOTAL KNEE ARTHROPLASTY;  Surgeon: Beverely Low, MD;  Location: [facility];  Service: Orthopedics;  Laterality: Left;  with adductor canal  ? ? ?Family History  ?Problem Relation Age of Onset  ? Cancer Mother   ? Pancreatic cancer Mother   ? Cirrhosis Father   ? Lupus Sister   ? Diabetes Maternal Grandmother   ? ? ?Social History  ? ?Social History Narrative  ? Lives at home with a child  ? Right handed  ? Drinks occasional caffeine  ?  ? ?Review of Systems ?General: Denies fevers, chills, weight loss ?CV: Denies chest pain, shortness of breath, palpitations ? ?Physical Exam ? ?  10/22/2021  ?  2:24 PM 02/17/2021  ? 10:15 PM 02/17/2021  ? 10:00 PM  ?Vitals with BMI  ?Height 5\' 5"     ?Weight 194 lbs 3 oz    ?BMI  32.32    ?Systolic 141  126  ?Diastolic 79  70  ?Pulse 69 70 73  ?  ?General:  No acute distress,  Alert and oriented, Non-Toxic, Normal speech and affect ?Examination shows 3.5 cm firm subcutaneous mobile masses in the areas of concern.  There is 1 in the lateral AC fossa that is readily palpable subcutaneously with no overlying skin changes.  Similar feeling lesions in the anterior right knee and right anterior tibial area.  No overlying skin changes on those as well. ? ?Assessment/Plan ?Patient presents with 3 subcutaneous firm nodules.  I explained I  was uncertain as to the pathology of these but given their intermittent tenderness think it would be reasonable to remove them and for peace of mind as well.  We discussed risks include bleeding, infection, damage to surrounding structures need for additional procedures.  All of her questions were answered she is interested in moving forward. ? ?Allena NapoleonPace S Jakwon Gayton ?10/22/2021, 2:38 PM  ? ? ?  ?

## 2021-10-28 DIAGNOSIS — M6281 Muscle weakness (generalized): Secondary | ICD-10-CM | POA: Diagnosis not present

## 2021-10-28 DIAGNOSIS — M25562 Pain in left knee: Secondary | ICD-10-CM | POA: Diagnosis not present

## 2021-10-28 DIAGNOSIS — Z96652 Presence of left artificial knee joint: Secondary | ICD-10-CM | POA: Diagnosis not present

## 2021-10-29 ENCOUNTER — Encounter: Payer: Self-pay | Admitting: Plastic Surgery

## 2021-10-29 ENCOUNTER — Other Ambulatory Visit (HOSPITAL_COMMUNITY)
Admission: RE | Admit: 2021-10-29 | Discharge: 2021-10-29 | Disposition: A | Discharge status: Home / Self Care | Payer: Medicaid Other | Source: Ambulatory Visit | Attending: Plastic Surgery | Admitting: Plastic Surgery

## 2021-10-29 ENCOUNTER — Ambulatory Visit (INDEPENDENT_AMBULATORY_CARE_PROVIDER_SITE_OTHER): Payer: Medicaid Other | Admitting: Plastic Surgery

## 2021-10-29 VITALS — BP 136/89 | HR 78 | Wt 192.0 lb

## 2021-10-29 DIAGNOSIS — L989 Disorder of the skin and subcutaneous tissue, unspecified: Secondary | ICD-10-CM | POA: Diagnosis not present

## 2021-10-29 DIAGNOSIS — R229 Localized swelling, mass and lump, unspecified: Secondary | ICD-10-CM

## 2021-10-29 DIAGNOSIS — M7989 Other specified soft tissue disorders: Secondary | ICD-10-CM | POA: Diagnosis not present

## 2021-10-29 NOTE — Progress Notes (Signed)
Operative Note  ? ?DATE OF OPERATION: 10/29/2021 ? ?LOCATION:   ? ?SURGICAL DEPARTMENT: Plastic Surgery ? ?PREOPERATIVE DIAGNOSES: Left arm lesion x1 and right leg lesions x2 ? ?POSTOPERATIVE DIAGNOSES:  same ? ?PROCEDURE:  ?Excision of left arm subcutaneous mass measuring 1 cm ?Excision of right leg subcutaneous masses measuring 0.5 cm each ?Complex closure measuring 3 cm total ? ?SURGEON: Ancil Linsey, MD ? ?ANESTHESIA:  Local ? ?COMPLICATIONS: None.  ? ?INDICATIONS FOR PROCEDURE:  ?The patient, Kirsten Walsh is a 61 y.o. female born on May 11, 1961, is here for treatment of left arm and right leg skin lesions ?MRN: [id number] ? ?CONSENT:  ?Informed consent was obtained directly from the patient. Risks, benefits and alternatives were fully discussed. Specific risks including but not limited to bleeding, infection, hematoma, seroma, scarring, pain, infection, wound healing problems, and need for further surgery were all discussed. The patient did have an ample opportunity to have questions answered to satisfaction.  ? ?DESCRIPTION OF PROCEDURE:  ?Local anesthesia was administered. The patient's operative site was prepped and draped in a sterile fashion. A time out was performed and all information was confirmed to be correct.  The lesions were excised with a 15 blade and tenotomy dissection.  They appeared to be clots within small obstructed veins.  Hemostasis was obtained.  Circumferential undermining was performed and the skin was advanced and closed in layers with interrupted buried Monocryl sutures and 5-0 fast gut for the skin.   ? ?The patient tolerated the procedure well.  There were no complications. ?  ? ?

## 2021-10-29 NOTE — Addendum Note (Signed)
Addended by: Allena Napoleon on: 10/29/2021 12:44 PM ? ? Modules accepted: Orders ? ?

## 2021-11-02 LAB — SURGICAL PATHOLOGY

## 2021-11-03 DIAGNOSIS — M545 Low back pain, unspecified: Secondary | ICD-10-CM | POA: Diagnosis not present

## 2021-11-03 DIAGNOSIS — M25561 Pain in right knee: Secondary | ICD-10-CM | POA: Diagnosis not present

## 2021-11-03 DIAGNOSIS — Z96652 Presence of left artificial knee joint: Secondary | ICD-10-CM | POA: Diagnosis not present

## 2021-11-03 DIAGNOSIS — M25512 Pain in left shoulder: Secondary | ICD-10-CM | POA: Diagnosis not present

## 2021-11-03 DIAGNOSIS — M170 Bilateral primary osteoarthritis of knee: Secondary | ICD-10-CM | POA: Diagnosis not present

## 2021-11-03 DIAGNOSIS — M25511 Pain in right shoulder: Secondary | ICD-10-CM | POA: Diagnosis not present

## 2021-11-03 DIAGNOSIS — M6281 Muscle weakness (generalized): Secondary | ICD-10-CM | POA: Diagnosis not present

## 2021-11-03 DIAGNOSIS — G894 Chronic pain syndrome: Secondary | ICD-10-CM | POA: Diagnosis not present

## 2021-11-03 DIAGNOSIS — M542 Cervicalgia: Secondary | ICD-10-CM | POA: Diagnosis not present

## 2021-11-03 DIAGNOSIS — M79609 Pain in unspecified limb: Secondary | ICD-10-CM | POA: Diagnosis not present

## 2021-11-03 DIAGNOSIS — M5137 Other intervertebral disc degeneration, lumbosacral region: Secondary | ICD-10-CM | POA: Diagnosis not present

## 2021-11-03 DIAGNOSIS — M25562 Pain in left knee: Secondary | ICD-10-CM | POA: Diagnosis not present

## 2021-11-03 DIAGNOSIS — Z6831 Body mass index (BMI) 31.0-31.9, adult: Secondary | ICD-10-CM | POA: Diagnosis not present

## 2021-11-12 ENCOUNTER — Ambulatory Visit (INDEPENDENT_AMBULATORY_CARE_PROVIDER_SITE_OTHER): Payer: Medicaid Other | Admitting: Plastic Surgery

## 2021-11-12 DIAGNOSIS — L989 Disorder of the skin and subcutaneous tissue, unspecified: Secondary | ICD-10-CM

## 2021-11-12 DIAGNOSIS — M6281 Muscle weakness (generalized): Secondary | ICD-10-CM | POA: Diagnosis not present

## 2021-11-12 DIAGNOSIS — M25562 Pain in left knee: Secondary | ICD-10-CM | POA: Diagnosis not present

## 2021-11-12 DIAGNOSIS — Z96652 Presence of left artificial knee joint: Secondary | ICD-10-CM | POA: Diagnosis not present

## 2021-11-12 NOTE — Progress Notes (Signed)
Patient presents postop from excision of multiple subcutaneous lesions.  All these were benign.  All the incisions look to be healing fine and all remaining sutures were removed.  We will plan to see her again on an as-needed basis.  All of her questions were answered.

## 2021-11-19 DIAGNOSIS — Z419 Encounter for procedure for purposes other than remedying health state, unspecified: Secondary | ICD-10-CM | POA: Diagnosis not present

## 2021-12-03 ENCOUNTER — Ambulatory Visit (INDEPENDENT_AMBULATORY_CARE_PROVIDER_SITE_OTHER): Payer: Self-pay | Admitting: Plastic Surgery

## 2021-12-03 VITALS — BP 134/94 | HR 65 | Ht 65.0 in | Wt 200.8 lb

## 2021-12-03 DIAGNOSIS — N62 Hypertrophy of breast: Secondary | ICD-10-CM

## 2021-12-03 DIAGNOSIS — Z411 Encounter for cosmetic surgery: Secondary | ICD-10-CM

## 2021-12-03 NOTE — Progress Notes (Signed)
Referring Provider No referring provider defined for this encounter.   CC:  Chief Complaint  Patient presents with   Consult      Kirsten Walsh is an 61 y.o. female.  HPI: Patient presents to discuss mastopexy.  She is interested in a breast lift and is bothered by the distance of her breast that occurred after breast-feeding.  She would like to keep most of her volume but have a perkier appearance.  No previous breast biopsies or procedures for her.  She is due for another mammogram but previous ones have been normal.  She is currently a D cup and would like to still be a D cup. She also desires improvement in her abdominal contour.  She has had children and feels that her abdomen is more prominent than she would like.  There is also some skin laxity that she has not been able to address through diet and exercise.  Only previous abdominal surgery is a laparoscopic cholecystectomy.  Allergies  Allergen Reactions   Apple Anaphylaxis and Swelling   Aspirin Anaphylaxis and Swelling   Daucus Carota Anaphylaxis and Swelling   Haloperidol And Related Anaphylaxis, Swelling and Other (See Comments)    Lock jaw and slurred speech    Outpatient Encounter Medications as of 12/03/2021  Medication Sig   amitriptyline (ELAVIL) 25 MG tablet Take 25 mg by mouth at bedtime as needed for sleep.   Butalbital-APAP-Caffeine 50-325-40 MG capsule Take 1 capsule by mouth every 6 (six) hours as needed for headache (migraine).   cetirizine (ZYRTEC) 10 MG tablet Take 10 mg by mouth daily as needed for allergies.   cloNIDine (CATAPRES) 0.1 MG tablet TAKE 1 TABLET(0.1 MG) BY MOUTH TWICE DAILY   Diclofenac Sodium 3 % GEL Apply 1 application topically 2 (two) times daily as needed (pain).   EPINEPHrine 0.3 mg/0.3 mL IJ SOAJ injection Inject 0.3 mg into the muscle as needed for anaphylaxis.   fluticasone (FLONASE) 50 MCG/ACT nasal spray Place 1 spray into both nostrils daily as needed for allergies or  rhinitis.   gabapentin (NEURONTIN) 300 MG capsule Take 300 mg by mouth 2 (two) times daily.   hydrochlorothiazide (HYDRODIURIL) 25 MG tablet TAKE 1 TABLET(25 MG) BY MOUTH DAILY   ondansetron (ZOFRAN) 4 MG tablet Take 4 mg by mouth 3 (three) times daily as needed for nausea or vomiting.   oxyCODONE (ROXICODONE) 15 MG immediate release tablet Take 15 mg by mouth every 12 (twelve) hours.   pantoprazole (PROTONIX) 40 MG tablet Take 40 mg by mouth in the morning.   Phendimetrazine Tartrate 105 MG CP24 Take 105 mg by mouth every morning. Last dose approx 12/22/20 per pt   polyethylene glycol (MIRALAX / GLYCOLAX) 17 g packet Take 17 g by mouth daily.   potassium chloride (KLOR-CON) 10 MEQ tablet Take 2 tablets (20 mEq total) by mouth daily.   promethazine (PHENERGAN) 25 MG suppository Place 1 suppository (25 mg total) rectally every 8 (eight) hours as needed for up to 6 doses for nausea or vomiting.   promethazine (PHENERGAN) 25 MG tablet Take 1 tablet (25 mg total) by mouth every 6 (six) hours as needed for nausea or vomiting.   SUMAtriptan (IMITREX) 100 MG tablet Take 100 mg by mouth every 2 (two) hours as needed for migraine. May repeat in 2 hours if headache persists or recurs.   topiramate (TOPAMAX) 100 MG tablet TAKE 1 AND 1/2 TABLETS(150 MG) BY MOUTH TWICE DAILY   traZODone (DESYREL) 50 MG tablet  Take 50 mg by mouth at bedtime.   Vitamin D, Ergocalciferol, (DRISDOL) 1.25 MG (50000 UNIT) CAPS capsule Take 50,000 Units by mouth every Monday.   [DISCONTINUED] potassium chloride SA (KLOR-CON) 20 MEQ tablet Take 1 tablet (20 mEq total) by mouth daily for 3 days.   No facility-administered encounter medications on file as of 12/03/2021.     Past Medical History:  Diagnosis Date   Arthritis    Chest pain    Chronic lower back pain    "right side to mid back" (07/06/2017)   Family history of adverse reaction to anesthesia    "daughter did; not sure happened"   Gall stones    History of anaphylaxis  06/26/2014   History of kidney stones    Hypertension    Migraine    "qd" (07/06/2017)   Syncope and collapse 07/06/2017    sitting on the commode; developed nausea and passed out; hit head on shower and suffered a laceration; woke up in "a pool of blood"    Past Surgical History:  Procedure Laterality Date   CHONDROPLASTY Left 08/17/2019   Procedure: CHONDROPLASTY;  Surgeon: Frederico Hamman, MD;  Location: [facility];  Service: Orthopedics;  Laterality: Left;   CYSTOSCOPY W/ STONE MANIPULATION  ~ 2006   INTRAUTERINE DEVICE INSERTION     "initially put in in 04/2002; changed prn" (02/14/2013)   KNEE ARTHROSCOPY WITH LATERAL MENISECTOMY  08/17/2019   Procedure: KNEE ARTHROSCOPY WITH LATERAL MENISECTOMY;  Surgeon: Frederico Hamman, MD;  Location: [facility];  Service: Orthopedics;;   KNEE ARTHROSCOPY WITH MEDIAL MENISECTOMY Left 08/17/2019   Procedure: KNEE ARTHROSCOPY WITH MEDIAL MENISECTOMY;  Surgeon: Frederico Hamman, MD;  Location: [facility];  Service: Orthopedics;  Laterality: Left;   LAPAROSCOPIC CHOLECYSTECTOMY  ~ 2004   TOTAL KNEE ARTHROPLASTY Left 01/16/2021   Procedure: TOTAL KNEE ARTHROPLASTY;  Surgeon: Beverely Low, MD;  Location: [facility];  Service: Orthopedics;  Laterality: Left;  with adductor canal    Family History  Problem Relation Age of Onset   Cancer Mother    Pancreatic cancer Mother    Cirrhosis Father    Lupus Sister    Diabetes Maternal Grandmother     Social History   Social History Narrative   Lives at home with a child   Right handed   Drinks occasional caffeine     Review of Systems General: Denies fevers, chills, weight loss CV: Denies chest pain, shortness of breath, palpitations  Physical Exam    12/03/2021    2:07 PM 10/29/2021   10:04 AM 10/22/2021    2:24 PM  Vitals with BMI  Height 5\' 5"   5\' 5"   Weight 200 lbs 13 oz 192 lbs 194 lbs 3 oz  BMI 33.41 31.95 32.32  Systolic 134 136  Diastolic 94  89 79  Pulse 65 78 69    General:  No acute distress,  Alert and oriented, Non-Toxic, Normal speech and affect Breast: She has grade 2 ptosis.  Sternal notch to nipple is 31 cm on the left and 32 cm in the right.  Nipple to fold is 14 cm on the left and 15 cm on the right.  No obvious scars or masses. Abdomen: Abdomen soft nontender.  No obvious hernias.  Well-healed laparoscopic scars.  Skin excess to moderate degree in the infra and supraumbilical areas.  Mild to moderate excess adipose tissue.  Assessment/Plan Patient is a good candidate for bilateral mastopexy combined with abdominoplasty.  She still considering whether or not to do these in conjunction or separately.  I discussed both procedures with her in detail including the risks that include bleeding, infection, damage to surrounding structures and need for additional procedures.  I discussed the location and orientation of the scars which include Wise pattern scars for the breast.  We discussed potential for persistent contour irregularities.  We discussed anticipated recovery afterwards and the need for drains after abdominoplasty.  All of her questions were answered and she is interested in moving forward.  Allena Napoleon 12/03/2021, 2:48 PM

## 2021-12-14 ENCOUNTER — Encounter: Payer: Self-pay | Admitting: *Deleted

## 2021-12-15 ENCOUNTER — Emergency Department (HOSPITAL_COMMUNITY): Payer: Medicaid Other

## 2021-12-15 ENCOUNTER — Other Ambulatory Visit: Payer: Self-pay

## 2021-12-15 ENCOUNTER — Observation Stay (HOSPITAL_COMMUNITY): Payer: Medicaid Other

## 2021-12-15 ENCOUNTER — Inpatient Hospital Stay (HOSPITAL_COMMUNITY)
Admission: EM | Admit: 2021-12-15 | Discharge: 2021-12-19 | DRG: 103 | Disposition: A | Discharge status: Home / Self Care | Payer: Medicaid Other | Attending: Internal Medicine | Admitting: Internal Medicine

## 2021-12-15 DIAGNOSIS — Z96652 Presence of left artificial knee joint: Secondary | ICD-10-CM | POA: Diagnosis present

## 2021-12-15 DIAGNOSIS — Z79899 Other long term (current) drug therapy: Secondary | ICD-10-CM

## 2021-12-15 DIAGNOSIS — I639 Cerebral infarction, unspecified: Secondary | ICD-10-CM | POA: Diagnosis not present

## 2021-12-15 DIAGNOSIS — Z79891 Long term (current) use of opiate analgesic: Secondary | ICD-10-CM

## 2021-12-15 DIAGNOSIS — G930 Cerebral cysts: Secondary | ICD-10-CM | POA: Diagnosis not present

## 2021-12-15 DIAGNOSIS — G894 Chronic pain syndrome: Secondary | ICD-10-CM | POA: Diagnosis present

## 2021-12-15 DIAGNOSIS — N182 Chronic kidney disease, stage 2 (mild): Secondary | ICD-10-CM | POA: Diagnosis present

## 2021-12-15 DIAGNOSIS — I429 Cardiomyopathy, unspecified: Secondary | ICD-10-CM | POA: Diagnosis present

## 2021-12-15 DIAGNOSIS — I10 Essential (primary) hypertension: Secondary | ICD-10-CM | POA: Diagnosis not present

## 2021-12-15 DIAGNOSIS — R569 Unspecified convulsions: Secondary | ICD-10-CM | POA: Diagnosis not present

## 2021-12-15 DIAGNOSIS — G43909 Migraine, unspecified, not intractable, without status migrainosus: Secondary | ICD-10-CM | POA: Diagnosis present

## 2021-12-15 DIAGNOSIS — M1990 Unspecified osteoarthritis, unspecified site: Secondary | ICD-10-CM | POA: Diagnosis present

## 2021-12-15 DIAGNOSIS — G8929 Other chronic pain: Secondary | ICD-10-CM | POA: Diagnosis present

## 2021-12-15 DIAGNOSIS — M5126 Other intervertebral disc displacement, lumbar region: Secondary | ICD-10-CM | POA: Diagnosis present

## 2021-12-15 DIAGNOSIS — R4701 Aphasia: Secondary | ICD-10-CM | POA: Diagnosis present

## 2021-12-15 DIAGNOSIS — G43709 Chronic migraine without aura, not intractable, without status migrainosus: Principal | ICD-10-CM | POA: Diagnosis present

## 2021-12-15 DIAGNOSIS — K5900 Constipation, unspecified: Secondary | ICD-10-CM | POA: Diagnosis not present

## 2021-12-15 DIAGNOSIS — Z91018 Allergy to other foods: Secondary | ICD-10-CM

## 2021-12-15 DIAGNOSIS — G8191 Hemiplegia, unspecified affecting right dominant side: Secondary | ICD-10-CM | POA: Diagnosis present

## 2021-12-15 DIAGNOSIS — Z743 Need for continuous supervision: Secondary | ICD-10-CM | POA: Diagnosis not present

## 2021-12-15 DIAGNOSIS — J309 Allergic rhinitis, unspecified: Secondary | ICD-10-CM | POA: Diagnosis present

## 2021-12-15 DIAGNOSIS — R4781 Slurred speech: Secondary | ICD-10-CM | POA: Diagnosis not present

## 2021-12-15 DIAGNOSIS — Z888 Allergy status to other drugs, medicaments and biological substances status: Secondary | ICD-10-CM

## 2021-12-15 DIAGNOSIS — G4489 Other headache syndrome: Secondary | ICD-10-CM | POA: Diagnosis not present

## 2021-12-15 DIAGNOSIS — Z9049 Acquired absence of other specified parts of digestive tract: Secondary | ICD-10-CM

## 2021-12-15 DIAGNOSIS — E669 Obesity, unspecified: Secondary | ICD-10-CM | POA: Diagnosis present

## 2021-12-15 DIAGNOSIS — Z886 Allergy status to analgesic agent status: Secondary | ICD-10-CM

## 2021-12-15 DIAGNOSIS — I129 Hypertensive chronic kidney disease with stage 1 through stage 4 chronic kidney disease, or unspecified chronic kidney disease: Secondary | ICD-10-CM | POA: Diagnosis present

## 2021-12-15 DIAGNOSIS — R29818 Other symptoms and signs involving the nervous system: Secondary | ICD-10-CM | POA: Diagnosis not present

## 2021-12-15 DIAGNOSIS — R531 Weakness: Secondary | ICD-10-CM | POA: Diagnosis not present

## 2021-12-15 DIAGNOSIS — E876 Hypokalemia: Secondary | ICD-10-CM | POA: Diagnosis present

## 2021-12-15 DIAGNOSIS — E860 Dehydration: Secondary | ICD-10-CM | POA: Diagnosis present

## 2021-12-15 DIAGNOSIS — Z6834 Body mass index (BMI) 34.0-34.9, adult: Secondary | ICD-10-CM

## 2021-12-15 DIAGNOSIS — N179 Acute kidney failure, unspecified: Secondary | ICD-10-CM | POA: Diagnosis present

## 2021-12-15 LAB — I-STAT CHEM 8, ED
BUN: 7 mg/dL (ref 6–20)
Calcium, Ion: 1.22 mmol/L (ref 1.15–1.40)
Chloride: 104 mmol/L (ref 98–111)
Creatinine, Ser: 1.2 mg/dL — ABNORMAL HIGH (ref 0.44–1.00)
Glucose, Bld: 92 mg/dL (ref 70–99)
HCT: 36 % (ref 36.0–46.0)
Hemoglobin: 12.2 g/dL (ref 12.0–15.0)
Potassium: 3.4 mmol/L — ABNORMAL LOW (ref 3.5–5.1)
Sodium: 141 mmol/L (ref 135–145)
TCO2: 26 mmol/L (ref 22–32)

## 2021-12-15 LAB — CBC
HCT: 37.8 % (ref 36.0–46.0)
Hemoglobin: 12 g/dL (ref 12.0–15.0)
MCH: 30 pg (ref 26.0–34.0)
MCHC: 31.7 g/dL (ref 30.0–36.0)
MCV: 94.5 fL (ref 80.0–100.0)
Platelets: 212 10*3/uL (ref 150–400)
RBC: 4 MIL/uL (ref 3.87–5.11)
RDW: 13.8 % (ref 11.5–15.5)
WBC: 4.9 10*3/uL (ref 4.0–10.5)
nRBC: 0 % (ref 0.0–0.2)

## 2021-12-15 LAB — DIFFERENTIAL
Abs Immature Granulocytes: 0.01 10*3/uL (ref 0.00–0.07)
Basophils Absolute: 0 10*3/uL (ref 0.0–0.1)
Basophils Relative: 1 %
Eosinophils Absolute: 0.1 10*3/uL (ref 0.0–0.5)
Eosinophils Relative: 2 %
Immature Granulocytes: 0 %
Lymphocytes Relative: 32 %
Lymphs Abs: 1.6 10*3/uL (ref 0.7–4.0)
Monocytes Absolute: 0.4 10*3/uL (ref 0.1–1.0)
Monocytes Relative: 8 %
Neutro Abs: 2.8 10*3/uL (ref 1.7–7.7)
Neutrophils Relative %: 57 %

## 2021-12-15 LAB — I-STAT BETA HCG BLOOD, ED (MC, WL, AP ONLY): I-stat hCG, quantitative: <5 m[IU]/mL (ref <5)

## 2021-12-15 LAB — COMPREHENSIVE METABOLIC PANEL
ALT: 17 U/L (ref 0–44)
AST: 22 U/L (ref 15–41)
Albumin: 3.4 g/dL — ABNORMAL LOW (ref 3.5–5.0)
Alkaline Phosphatase: 68 U/L (ref 38–126)
Anion gap: 8 (ref 5–15)
BUN: 7 mg/dL (ref 6–20)
CO2: 27 mmol/L (ref 22–32)
Calcium: 8.9 mg/dL (ref 8.9–10.3)
Chloride: 106 mmol/L (ref 98–111)
Creatinine, Ser: 1.22 mg/dL — ABNORMAL HIGH (ref 0.44–1.00)
GFR, Estimated: 51 mL/min — ABNORMAL LOW (ref >60)
Glucose, Bld: 96 mg/dL (ref 70–99)
Potassium: 3.4 mmol/L — ABNORMAL LOW (ref 3.5–5.1)
Sodium: 141 mmol/L (ref 135–145)
Total Bilirubin: 0.6 mg/dL (ref 0.3–1.2)
Total Protein: 6.2 g/dL — ABNORMAL LOW (ref 6.5–8.1)

## 2021-12-15 LAB — PROTIME-INR
INR: 1.1 (ref 0.8–1.2)
Prothrombin Time: 13.7 seconds (ref 11.4–15.2)

## 2021-12-15 LAB — APTT: aPTT: 27 seconds (ref 24–36)

## 2021-12-15 LAB — CBG MONITORING, ED: Glucose-Capillary: 120 mg/dL — ABNORMAL HIGH (ref 70–99)

## 2021-12-15 MED ORDER — SODIUM CHLORIDE 0.9 % IV SOLN
25.0000 mg | Freq: Once | INTRAVENOUS | Status: AC
  Administered 2021-12-15: 25 mg via INTRAVENOUS
  Filled 2021-12-15 (×2): qty 1

## 2021-12-15 MED ORDER — LACTATED RINGERS IV BOLUS: 1000.0000 mL | Freq: Once | INTRAVENOUS | Status: DC

## 2021-12-15 MED ORDER — SODIUM CHLORIDE 0.9% FLUSH
3.0000 mL | Freq: Once | INTRAVENOUS | Status: AC
  Administered 2021-12-15: 3 mL via INTRAVENOUS

## 2021-12-15 MED ORDER — PROCHLORPERAZINE EDISYLATE 10 MG/2ML IJ SOLN
10.0000 mg | Freq: Once | INTRAMUSCULAR | Status: AC
  Administered 2021-12-16: 10 mg via INTRAVENOUS
  Filled 2021-12-15: qty 2

## 2021-12-15 MED ORDER — FENTANYL CITRATE PF 50 MCG/ML IJ SOSY
50.0000 ug | PREFILLED_SYRINGE | Freq: Once | INTRAMUSCULAR | Status: AC
  Administered 2021-12-15: 50 ug via INTRAVENOUS
  Filled 2021-12-15: qty 1

## 2021-12-15 MED ORDER — HYDROMORPHONE HCL 1 MG/ML IJ SOLN
0.5000 mg | Freq: Once | INTRAMUSCULAR | Status: AC
  Administered 2021-12-16: 0.5 mg via INTRAVENOUS
  Filled 2021-12-15: qty 0.5

## 2021-12-15 MED ORDER — DIPHENHYDRAMINE HCL 50 MG/ML IJ SOLN
25.0000 mg | Freq: Once | INTRAMUSCULAR | Status: AC
  Administered 2021-12-15: 25 mg via INTRAVENOUS
  Filled 2021-12-15: qty 1

## 2021-12-15 MED ORDER — SODIUM CHLORIDE 0.9 % IV BOLUS
1000.0000 mL | Freq: Once | INTRAVENOUS | Status: AC
  Administered 2021-12-15: 1000 mL via INTRAVENOUS

## 2021-12-15 NOTE — Consult Note (Signed)
NEURO HOSPITALIST CONSULT NOTE   Requesting physician: Dr. Matilde Sprang  Reason for Consult: Seizure-like spell   History obtained from:  EMS, Patient and Chart     HPI:                                                                                                                                          Kirsten Walsh is an 61 y.o. female with a PMHx of chronic lower back pain (wears a brace), kidney stones, gallstones, HTN and migraines who presents via EMS as a Code Stroke after having a seizure-like spell while shopping at [place]. She has no prior history of seizures. LKN was 1615. Onset of seizure-like activity was at 1630.   Semiology described as shaking with loss of postural stability, caught by a bystander who lowered her to the ground, GTC motor activity lasting for 2 minutes with a 5 minute postictal period. No bowel or bladder incontinence, no foaming at the mouth, no tongue biting.   On EMS arrival the patient's speech was dysarthric with expressive dysphasia and she seemed confused. They also noted some RUE drift. She was nauseated with one episode of vomiting. She reported having a right sided headache en route. CBG 114. BP 170/109.   In the ED, she endorsed continued headache, which was right sided and 10/10.    The patient states that she has no prior history of seizures. She is not withdrawing from EtOH or benzos. She endorses having had migraines since she started menstruating at age 60. She states that she started having complicated migraines with right sided weakness beginning at age 15 following the birth of her last child. She states that she has had over [id number] complicated migraines with right sided weakness since then.   She states that she has been under significant stress lately due to her daughter having a tubal pregnancy that had to be terminated, followed by another miscarriage only a few weeks later.   Past Medical History:  Diagnosis Date    Arthritis    Chest pain    Chronic lower back pain    "right side to mid back" (07/06/2017)   Family history of adverse reaction to anesthesia    "daughter did; not sure happened"   Gall stones    History of anaphylaxis 06/26/2014   History of kidney stones    Hypertension    Migraine    "qd" (07/06/2017)   Syncope and collapse 07/06/2017    sitting on the commode; developed nausea and passed out; hit head on shower and suffered a laceration; woke up in "a pool of blood"    Past Surgical History:  Procedure Laterality Date   CHONDROPLASTY Left 08/17/2019   Procedure: CHONDROPLASTY;  Surgeon: Earlie Server, MD;  Location: [facility];  Service: Orthopedics;  Laterality: Left;   CYSTOSCOPY W/ STONE MANIPULATION  ~ 2006   INTRAUTERINE DEVICE INSERTION     "initially put in in 04/2002; changed prn" (02/14/2013)   KNEE ARTHROSCOPY WITH LATERAL MENISECTOMY  08/17/2019   Procedure: KNEE ARTHROSCOPY WITH LATERAL MENISECTOMY;  Surgeon: Frederico Hamman, MD;  Location: [facility];  Service: Orthopedics;;   KNEE ARTHROSCOPY WITH MEDIAL MENISECTOMY Left 08/17/2019   Procedure: KNEE ARTHROSCOPY WITH MEDIAL MENISECTOMY;  Surgeon: Frederico Hamman, MD;  [facility];  Service: Orthopedics;  Laterality: Left;   LAPAROSCOPIC CHOLECYSTECTOMY  ~ 2004   TOTAL KNEE ARTHROPLASTY Left 01/16/2021   Procedure: TOTAL KNEE ARTHROPLASTY;  Surgeon: Beverely Low, MD;  Location: [facility];  Service: Orthopedics;  Laterality: Left;  with adductor canal    Family History  Problem Relation Age of Onset   Cancer Mother    Pancreatic cancer Mother    Cirrhosis Father    Lupus Sister    Diabetes Maternal Grandmother             Social History:  reports that she has never smoked. She has never used smokeless tobacco. She reports current alcohol use. She reports that she does not use drugs.  Allergies  Allergen Reactions   Apple Anaphylaxis and Swelling   Aspirin  Anaphylaxis and Swelling   Daucus Carota Anaphylaxis and Swelling   Haloperidol And Related Anaphylaxis, Swelling and Other (See Comments)    Lock jaw and slurred speech    MEDICATIONS:                                                                                                                      No current facility-administered medications on file prior to encounter.   Current Outpatient Medications on File Prior to Encounter  Medication Sig Dispense Refill   amitriptyline (ELAVIL) 25 MG tablet Take 25 mg by mouth at bedtime as needed for sleep.     Butalbital-APAP-Caffeine 50-325-40 MG capsule Take 1 capsule by mouth every 6 (six) hours as needed for headache (migraine).     cetirizine (ZYRTEC) 10 MG tablet Take 10 mg by mouth daily as needed for allergies.     cloNIDine (CATAPRES) 0.1 MG tablet TAKE 1 TABLET(0.1 MG) BY MOUTH TWICE DAILY 180 tablet 2   Diclofenac Sodium 3 % GEL Apply 1 application topically 2 (two) times daily as needed (pain).     EPINEPHrine 0.3 mg/0.3 mL IJ SOAJ injection Inject 0.3 mg into the muscle as needed for anaphylaxis.     fluticasone (FLONASE) 50 MCG/ACT nasal spray Place 1 spray into both nostrils daily as needed for allergies or rhinitis.     gabapentin (NEURONTIN) 300 MG capsule Take 300 mg by mouth 2 (two) times daily.     hydrochlorothiazide (HYDRODIURIL) 25 MG tablet TAKE 1 TABLET(25 MG) BY MOUTH DAILY 90 tablet 2   ondansetron (ZOFRAN) 4 MG tablet Take 4 mg by mouth 3 (three) times daily as  needed for nausea or vomiting.     oxyCODONE (ROXICODONE) 15 MG immediate release tablet Take 15 mg by mouth every 12 (twelve) hours.     pantoprazole (PROTONIX) 40 MG tablet Take 40 mg by mouth in the morning.     Phendimetrazine Tartrate 105 MG CP24 Take 105 mg by mouth every morning. Last dose approx 12/22/20 per pt     polyethylene glycol (MIRALAX / GLYCOLAX) 17 g packet Take 17 g by mouth daily.     potassium chloride (KLOR-CON) 10 MEQ tablet Take 2 tablets  (20 mEq total) by mouth daily. 60 tablet 0   promethazine (PHENERGAN) 25 MG suppository Place 1 suppository (25 mg total) rectally every 8 (eight) hours as needed for up to 6 doses for nausea or vomiting. 6 each 0   promethazine (PHENERGAN) 25 MG tablet Take 1 tablet (25 mg total) by mouth every 6 (six) hours as needed for nausea or vomiting. 30 tablet 0   SUMAtriptan (IMITREX) 100 MG tablet Take 100 mg by mouth every 2 (two) hours as needed for migraine. May repeat in 2 hours if headache persists or recurs.     topiramate (TOPAMAX) 100 MG tablet TAKE 1 AND 1/2 TABLETS(150 MG) BY MOUTH TWICE DAILY 270 tablet 2   traZODone (DESYREL) 50 MG tablet Take 50 mg by mouth at bedtime.     Vitamin D, Ergocalciferol, (DRISDOL) 1.25 MG (50000 UNIT) CAPS capsule Take 50,000 Units by mouth every Monday.     [DISCONTINUED] potassium chloride SA (KLOR-CON) 20 MEQ tablet Take 1 tablet (20 mEq total) by mouth daily for 3 days. 3 tablet 0     ROS:                                                                                                                                       Endorses low back pain. Other ROS as per HPI.    Weight 95 kg. BP (!) 154/85   Pulse 66   Resp 20   Wt 95 kg   SpO2 99%   BMI 34.85 kg/m    General Examination:                                                                                                       Physical Exam  HEENT-  [place]/AT    Lungs- Respirations unlabored Extremities- No edema  Neurological Examination Mental Status:  Awake with stuttering speech that waxes and wanes. Anxious and labile affect. Follows commands.  Oriented x 5. Answers questions hesitantly but without errors of grammar or syntax. No paraphasias noted. No dysarthria. Overall quality suggestive of embellishment versus conversion disorder.  Cranial Nerves: II: Temporal visual fields intact with no extinction to DSS. PERRL.   III,IV, VI: No ptosis. EOMI. No nystagmus. Will intermittently roll  eyes upwards nearly burying irises beneath fluttering eyelids, in association with anxious/distressed affect. This latter finding does not appear physiological.  V: Symmetric cool temp sensation bilaterally.  VII: Face contorts symmetrically on initial assessment. Later, when less labile, she smiles/grimaces symmetrically.  VIII: Hearing intact to voice IX,X: Phonation intact. No hypophonia or hoarseness XI: Symmetric  XII: Midline tongue extension Motor: Maximum strength elicitable as follows: Right : Upper extremity   5/5    Left:     Upper extremity   5/5  Lower extremity   5/5     Lower extremity   5/5 No pronator drift.  Above findings in the context of giveway weakness x 4 that was more prominent on the right and improved with coaching.  Normal tone throughout; no atrophy noted Sensory: Temp and light touch intact throughout, bilaterally Deep Tendon Reflexes: 2+ and symmetric throughout Plantars: Right: downgoing   Left: downgoing Cerebellar: No ataxia with FNF bilaterally  Gait: Deferred   Lab Results: Basic Metabolic Panel: No results for input(s): "NA", "K", "CL", "CO2", "GLUCOSE", "BUN", "CREATININE", "CALCIUM", "MG", "PHOS" in the last 168 hours.  CBC: No results for input(s): "WBC", "NEUTROABS", "HGB", "HCT", "MCV", "PLT" in the last 168 hours.  Cardiac Enzymes: No results for input(s): "CKTOTAL", "CKMB", "CKMBINDEX", "TROPONINI" in the last 168 hours.  Lipid Panel: No results for input(s): "CHOL", "TRIG", "HDL", "CHOLHDL", "VLDL", "LDLCALC" in the last 168 hours.  Imaging: No results found.  Assessment: 61 y.o. female with a PMHx of chronic lower back pain (wears a brace), kidney stones, gallstones, HTN and migraines who presents via EMS as a Code Stroke after having a seizure-like spell while shopping at [place]. She has no prior history of seizures. LKN was 1615. Semiology described as shaking with loss of postural stability, caught by a bystander who lowered her  to the ground, GTC motor activity lasting for 2 minutes with a 5 minute postictal period. No bowel or bladder incontinence, no foaming at the mouth, no tongue biting.  - Exam with multiple functional/nonphysiological components as documented above.  - She endorsed a severe right sided headache on EMS arrival which has continued during her ED evaluation. After CT she stated that it felt like a migraine. Of note, she endorses having had migraines since she started menstruating at age 68. She states that she started having complicated migraines with right sided weakness beginning at age 46 following the birth of her last child. She states that she has had over [id number] complicated migraines with right sided weakness since then.  - The patient states that she has no prior history of seizures. She is not withdrawing from EtOH or benzos.  - She states that she has been under significant stress lately due to her daughter having a tubal pregnancy that had to be terminated, followed by another miscarriage only a few weeks later.  and plan per attending neurologist - Na, Ca normal. WBC normal. LFTs normal. Elevated Cr at 1.22. Potassium slightly low at 3.4. Glucose normal.  - CT head: No evidence of acute intracranial abnormality. ASPECTS is 10. 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   Recommendations: - EEG (ordered) - MRI brain (ordered) - Phenergan 25 mg IV as infusion.  - 1 L NS bolus - Keep room dark and comfortable with temperature adjusted to patient's request.  - Notify Neurohospitalist team if she has another seizure-like episode.  - Will need outpatient Neurology follow up as outpatient.    Electronically signed: Dr. Caryl Pina 12/15/2021, 6:01 PM

## 2021-12-15 NOTE — ED Triage Notes (Signed)
Pt BIB [facility] from [facility] after witnessed tonic/clonic seizure lasting ~2 min with a 5 min post ictal phase. Patient reported 10/10 R headache en route with aphasia and dysarthria noted by EMS. LKW 1615, seizure at 1630.

## 2021-12-16 ENCOUNTER — Observation Stay (HOSPITAL_COMMUNITY): Payer: Medicaid Other

## 2021-12-16 ENCOUNTER — Encounter (HOSPITAL_COMMUNITY): Payer: Self-pay | Admitting: Family Medicine

## 2021-12-16 DIAGNOSIS — M545 Low back pain, unspecified: Secondary | ICD-10-CM | POA: Diagnosis not present

## 2021-12-16 DIAGNOSIS — M5412 Radiculopathy, cervical region: Secondary | ICD-10-CM | POA: Diagnosis not present

## 2021-12-16 DIAGNOSIS — R569 Unspecified convulsions: Secondary | ICD-10-CM | POA: Diagnosis not present

## 2021-12-16 DIAGNOSIS — G43909 Migraine, unspecified, not intractable, without status migrainosus: Secondary | ICD-10-CM | POA: Diagnosis not present

## 2021-12-16 DIAGNOSIS — G894 Chronic pain syndrome: Secondary | ICD-10-CM | POA: Diagnosis not present

## 2021-12-16 DIAGNOSIS — M47816 Spondylosis without myelopathy or radiculopathy, lumbar region: Secondary | ICD-10-CM | POA: Diagnosis not present

## 2021-12-16 DIAGNOSIS — I10 Essential (primary) hypertension: Secondary | ICD-10-CM | POA: Diagnosis not present

## 2021-12-16 LAB — BASIC METABOLIC PANEL
Anion gap: 6 (ref 5–15)
BUN: 5 mg/dL — ABNORMAL LOW (ref 6–20)
CO2: 24 mmol/L (ref 22–32)
Calcium: 8.7 mg/dL — ABNORMAL LOW (ref 8.9–10.3)
Chloride: 111 mmol/L (ref 98–111)
Creatinine, Ser: 0.98 mg/dL (ref 0.44–1.00)
GFR, Estimated: >60 mL/min (ref >60)
Glucose, Bld: 119 mg/dL — ABNORMAL HIGH (ref 70–99)
Potassium: 3.4 mmol/L — ABNORMAL LOW (ref 3.5–5.1)
Sodium: 141 mmol/L (ref 135–145)

## 2021-12-16 LAB — CBC
HCT: 34.5 % — ABNORMAL LOW (ref 36.0–46.0)
Hemoglobin: 11.5 g/dL — ABNORMAL LOW (ref 12.0–15.0)
MCH: 30.9 pg (ref 26.0–34.0)
MCHC: 33.3 g/dL (ref 30.0–36.0)
MCV: 92.7 fL (ref 80.0–100.0)
Platelets: 190 10*3/uL (ref 150–400)
RBC: 3.72 MIL/uL — ABNORMAL LOW (ref 3.87–5.11)
RDW: 14 % (ref 11.5–15.5)
WBC: 3.5 10*3/uL — ABNORMAL LOW (ref 4.0–10.5)
nRBC: 0 % (ref 0.0–0.2)

## 2021-12-16 LAB — HIV ANTIBODY (ROUTINE TESTING W REFLEX): HIV Screen 4th Generation wRfx: NONREACTIVE

## 2021-12-16 LAB — MAGNESIUM: Magnesium: 1.9 mg/dL (ref 1.7–2.4)

## 2021-12-16 MED ORDER — TIZANIDINE HCL 4 MG PO TABS
4.0000 mg | ORAL_TABLET | Freq: Every day | ORAL | Status: DC
  Administered 2021-12-16 – 2021-12-18 (×4): 4 mg via ORAL
  Filled 2021-12-16 (×4): qty 1

## 2021-12-16 MED ORDER — FENTANYL CITRATE PF 50 MCG/ML IJ SOSY
25.0000 ug | PREFILLED_SYRINGE | INTRAMUSCULAR | Status: DC | PRN
  Administered 2021-12-16 – 2021-12-19 (×5): 25 ug via INTRAVENOUS
  Filled 2021-12-16 (×5): qty 1

## 2021-12-16 MED ORDER — ONDANSETRON HCL 4 MG PO TABS
4.0000 mg | ORAL_TABLET | Freq: Four times a day (QID) | ORAL | Status: DC | PRN
  Administered 2021-12-16: 4 mg via ORAL
  Filled 2021-12-16: qty 1

## 2021-12-16 MED ORDER — METHOCARBAMOL 1000 MG/10ML IJ SOLN
500.0000 mg | Freq: Three times a day (TID) | INTRAMUSCULAR | Status: DC | PRN
  Administered 2021-12-16: 500 mg via INTRAVENOUS
  Filled 2021-12-16: qty 500

## 2021-12-16 MED ORDER — DICLOFENAC SODIUM 1 % EX GEL
1.0000 | Freq: Two times a day (BID) | CUTANEOUS | Status: DC | PRN
Start: 2021-12-16 — End: 2021-12-19

## 2021-12-16 MED ORDER — POTASSIUM CHLORIDE CRYS ER 20 MEQ PO TBCR
40.0000 meq | EXTENDED_RELEASE_TABLET | Freq: Once | ORAL | Status: AC
  Administered 2021-12-16: 40 meq via ORAL
  Filled 2021-12-16: qty 2

## 2021-12-16 MED ORDER — TOPIRAMATE 100 MG PO TABS
200.0000 mg | ORAL_TABLET | Freq: Two times a day (BID) | ORAL | Status: DC
  Administered 2021-12-16 – 2021-12-19 (×6): 200 mg via ORAL
  Filled 2021-12-16 (×6): qty 2

## 2021-12-16 MED ORDER — ACETAMINOPHEN 650 MG RE SUPP: 650.0000 mg | Freq: Four times a day (QID) | RECTAL | Status: DC | PRN

## 2021-12-16 MED ORDER — LIDOCAINE 5 % EX OINT
1.0000 | TOPICAL_OINTMENT | Freq: Every day | CUTANEOUS | Status: DC | PRN
  Administered 2021-12-17: 1 via TOPICAL
  Filled 2021-12-16: qty 35.44

## 2021-12-16 MED ORDER — OXYCODONE HCL 5 MG PO TABS
15.0000 mg | ORAL_TABLET | Freq: Four times a day (QID) | ORAL | Status: DC | PRN
  Administered 2021-12-16 – 2021-12-19 (×7): 15 mg via ORAL
  Filled 2021-12-16 (×8): qty 3

## 2021-12-16 MED ORDER — SUMATRIPTAN SUCCINATE 100 MG PO TABS
100.0000 mg | ORAL_TABLET | Freq: Two times a day (BID) | ORAL | Status: DC | PRN
  Administered 2021-12-18: 100 mg via ORAL
  Filled 2021-12-16 (×2): qty 1

## 2021-12-16 MED ORDER — TRAZODONE HCL 100 MG PO TABS: 100.0000 mg | ORAL_TABLET | Freq: Every evening | ORAL | Status: DC | PRN

## 2021-12-16 MED ORDER — PANTOPRAZOLE SODIUM 40 MG PO TBEC
40.0000 mg | DELAYED_RELEASE_TABLET | Freq: Every day | ORAL | Status: DC
  Administered 2021-12-16 – 2021-12-19 (×4): 40 mg via ORAL
  Filled 2021-12-16 (×4): qty 1

## 2021-12-16 MED ORDER — TOPIRAMATE 25 MG PO TABS
150.0000 mg | ORAL_TABLET | Freq: Two times a day (BID) | ORAL | Status: DC
  Administered 2021-12-16 (×2): 150 mg via ORAL
  Filled 2021-12-16 (×2): qty 2

## 2021-12-16 MED ORDER — ONDANSETRON HCL 4 MG/2ML IJ SOLN
4.0000 mg | Freq: Four times a day (QID) | INTRAMUSCULAR | Status: DC | PRN
  Administered 2021-12-17 – 2021-12-18 (×2): 4 mg via INTRAVENOUS
  Filled 2021-12-16 (×2): qty 2

## 2021-12-16 MED ORDER — POLYETHYLENE GLYCOL 3350 17 G PO PACK: 17.0000 g | PACK | Freq: Every day | ORAL | Status: DC | PRN

## 2021-12-16 MED ORDER — SODIUM CHLORIDE 0.9% FLUSH
3.0000 mL | Freq: Two times a day (BID) | INTRAVENOUS | Status: DC
  Administered 2021-12-16 – 2021-12-18 (×7): 3 mL via INTRAVENOUS

## 2021-12-16 MED ORDER — CLONIDINE HCL 0.1 MG PO TABS
0.1000 mg | ORAL_TABLET | Freq: Two times a day (BID) | ORAL | Status: DC
  Administered 2021-12-16 – 2021-12-19 (×7): 0.1 mg via ORAL
  Filled 2021-12-16 (×8): qty 1

## 2021-12-16 MED ORDER — POTASSIUM CHLORIDE CRYS ER 20 MEQ PO TBCR
20.0000 meq | EXTENDED_RELEASE_TABLET | Freq: Every day | ORAL | Status: DC
  Administered 2021-12-16 – 2021-12-17 (×2): 20 meq via ORAL
  Filled 2021-12-16 (×3): qty 1

## 2021-12-16 MED ORDER — ACETAMINOPHEN 325 MG PO TABS: 650.0000 mg | ORAL_TABLET | Freq: Four times a day (QID) | ORAL | Status: DC | PRN

## 2021-12-16 MED ORDER — ENOXAPARIN SODIUM 40 MG/0.4ML IJ SOSY
40.0000 mg | PREFILLED_SYRINGE | INTRAMUSCULAR | Status: DC
  Administered 2021-12-16 – 2021-12-19 (×4): 40 mg via SUBCUTANEOUS
  Filled 2021-12-16 (×4): qty 0.4

## 2021-12-16 MED ORDER — GABAPENTIN 300 MG PO CAPS
300.0000 mg | ORAL_CAPSULE | Freq: Two times a day (BID) | ORAL | Status: DC
  Administered 2021-12-16 – 2021-12-19 (×8): 300 mg via ORAL
  Filled 2021-12-16 (×8): qty 1

## 2021-12-16 NOTE — Progress Notes (Signed)
EEG complete - results pending 

## 2021-12-16 NOTE — Progress Notes (Signed)
[facility]                                                                               Kirsten Walsh, is a 61 y.o. female, DOB - 1960-08-26, YQI:[id number] Admit date - 12/15/2021    Outpatient Primary MD for the patient is Fleet Contras, MD  LOS - 0  days    Brief summary     Kirsten Walsh is a pleasant 61 y.o. female with medical history significant for chronic migraine, chronic back pain, hypertension, and mild renal insufficiency, now presenting to the emergency department after an episode of generalized seizure-like activity.  Patient reports that she woke with a migraine, similar to her prior migraines which include right-sided weakness.  Head CT negative for acute intracranial abnormality.  MRI brain was normal. neurology evaluated the patient, and hospitalists asked to admit.    Assessment & Plan    Assessment and Plan:  Seizure like episode:  None in the last 24 hours.  Prolonged EEG discussed by the neurologist.  Continue with topamax to 200 mg BID.  MRI c spine shows mild foraminal narrowing on the left.  Recommend outpatient follow up with Neurology next month .     Headache/ Migraine:  - pain control.    Lower back pain:  Grt x rays of lower back, added robaxin and IV fentanyl.  When pain is well controlled, plan for discharge . Therapy evaluations are pending.   Chronic pain syndrome:  Resume home meds.    Hypertension:  BP parameters are optimal.    Stage 2 CKD:  Creatinine at baseline.     Code Status: full code.  DVT Prophylaxis:  enoxaparin (LOVENOX) injection 40 mg Start: 12/16/21 1000   Level of Care: Level of care: Telemetry Medical Family Communication: none at bedside.   Disposition Plan:     Remains inpatient appropriate: IV pain meds   Procedures:  MRI brain MRI  cervical spine.   Consultants:   Neurology.   Antimicrobials:   Anti-infectives (From admission, onward)    None         Medications  Scheduled Meds:  cloNIDine  0.1 mg Oral BID   enoxaparin (LOVENOX) injection  40 mg Subcutaneous Q24H   gabapentin  300 mg Oral BID   pantoprazole  40 mg Oral Daily   potassium chloride SA  20 mEq Oral Daily   sodium chloride flush  3 mL Intravenous Q12H   tiZANidine  4 mg Oral QHS   topiramate  200 mg Oral BID   Continuous Infusions:  lactated ringers     methocarbamol (ROBAXIN) IV 500 mg (12/16/21 1729)   PRN Meds:.acetaminophen **OR** acetaminophen, diclofenac Sodium, fentaNYL (SUBLIMAZE) injection, lidocaine, methocarbamol (ROBAXIN) IV, ondansetron **OR** ondansetron (ZOFRAN) IV, oxyCODONE, polyethylene glycol, SUMAtriptan, traZODone    Subjective:   Kirsten Walsh was seen and examined today.  Left sided back pain, she is requesting for pain meds.   Objective:   Vitals:   12/16/21 0359 12/16/21 0734 12/16/21 1137 12/16/21 1528  BP: (!) 107/59 112/68 126/74 113/66  Pulse: (!) 48 (!) 44 (!) 43 (!) 48  Resp: 18 16 16 16   Temp: 97.9  F (36.6 C) 97.7 F (36.5 C) (!) 97.4 F (36.3 C) (!) 97.4 F (36.3 C)  TempSrc:  Oral Oral Oral  SpO2: 100% 99% 100% 100%  Weight:        Intake/Output Summary (Last 24 hours) at 12/16/2021 1757 Last data filed at 12/16/2021 [id number] Gross per 24 hour  Intake 133 ml  Output --  Net 133 ml   Filed Weights   12/15/21 1746  Weight: 95 kg     Exam General: Alert and oriented x 3, NAD Cardiovascular: S1 S2 auscultated, no murmurs, RRR Respiratory: Clear to auscultation bilaterally, no wheezing, rales or rhonchi Gastrointestinal: Soft, nontender, nondistended, + bowel sounds Ext: no pedal edema bilaterally Neuro: AAOx3, Cr N's II- XII. Strength 5/5 upper and lower extremities bilaterally Skin: No rashes Psych: Normal affect and demeanor, alert and oriented x3    Data Reviewed:  I have personally reviewed following labs and imaging studies   CBC Lab Results  Component Value Date   WBC 3.5 (L) 12/16/2021    RBC 3.72 (L) 12/16/2021   HGB 11.5 (L) 12/16/2021   HCT 34.5 (L) 12/16/2021   MCV 92.7 12/16/2021   MCH 30.9 12/16/2021   PLT 190 12/16/2021   MCHC 33.3 12/16/2021   RDW 14.0 12/16/2021   LYMPHSABS 1.6 12/15/2021   MONOABS 0.4 12/15/2021   EOSABS 0.1 12/15/2021   BASOSABS 0.0 99991111     Last metabolic panel Lab Results  Component Value Date   NA 141 12/16/2021   K 3.4 (L) 12/16/2021   CL 111 12/16/2021   CO2 24 12/16/2021   BUN 5 (L) 12/16/2021   CREATININE 0.98 12/16/2021   GLUCOSE 119 (H) 12/16/2021   GFRNONAA >60 12/16/2021   GFRAA 72 08/14/2020   CALCIUM 8.7 (L) 12/16/2021   PHOS 1.8 (L) 07/06/2017   PROT 6.2 (L) 12/15/2021   ALBUMIN 3.4 (L) 12/15/2021   BILITOT 0.6 12/15/2021   ALKPHOS 68 12/15/2021   AST 22 12/15/2021   ALT 17 12/15/2021   ANIONGAP 6 12/16/2021    CBG (last 3)  Recent Labs    12/15/21 1741  GLUCAP 120*      Coagulation Profile: Recent Labs  Lab 12/15/21 1850  INR 1.1     Radiology Studies: DG Lumbar Spine 2-3 Views  Result Date: 12/16/2021 CLINICAL DATA:  Back pain. EXAM: LUMBAR SPINE - 2-3 VIEW COMPARISON:  Lumbar spine radiographs 05/14/2020. FINDINGS: There are 5 lumbar type vertebral bodies. The alignment is stable with a minimal convex right scoliosis which may be positional. No evidence of acute fracture or pars defect. There is mild multilevel disc space narrowing and intervertebral spurring, greatest at L2-3, L3-4 and L5-S1. Intrauterine device and cholecystectomy clips are noted. IMPRESSION: Mild lumbar spondylosis.  No acute osseous findings. Electronically Signed   By: Richardean Sale M.D.   On: 12/16/2021 16:37   MR CERVICAL SPINE WO CONTRAST  Result Date: 12/16/2021 CLINICAL DATA:  Cervical radiculopathy. EXAM: MRI CERVICAL SPINE WITHOUT CONTRAST TECHNIQUE: Multiplanar, multisequence MR imaging of the cervical spine was performed. No intravenous contrast was administered. COMPARISON:  MRI cervical spine 01/13/2010.  CT cervical spine 05/14/2020 FINDINGS: Alignment: Normal Vertebrae: Normal bone marrow.  Negative for fracture or mass Cord: Cord evaluation limited by motion. Allowing for motion, no cord signal abnormality identified. Posterior Fossa, vertebral arteries, paraspinal tissues: Negative Disc levels: C2-3: Negative C3-4: Mild disc bulging.  Negative for stenosis C4-5: Negative C5-6: Mild disc degeneration with mild disc space narrowing and disc desiccation. Mild  to moderate left foraminal narrowing due to uncinate spurring. Right foramen patent. Central canal patent C6-7: Mild disc degeneration. Diffuse uncinate spurring. Mild left foraminal narrowing due to spurring. Central canal patent C7-T1: Negative IMPRESSION: Disc degeneration and spurring on the left at C5-6 and C6-7 causing left foraminal narrowing. Electronically Signed   By: Marlan Palau M.D.   On: 12/16/2021 13:36   EEG adult  Result Date: 12/16/2021 Charlsie Quest, MD     12/16/2021  8:27 AM Patient Name: Kirsten Walsh MRN: [id number] Epilepsy Attending: Charlsie Quest Referring Physician/Provider: Caryl Pina, MD Date: 12/16/2021 Duration: 21.20 mins Patient history: 61 year old female with seizure-like activity.  EEG to evaluate for seizure. Level of alertness: Awake, asleep AEDs during EEG study: TPM Technical aspects: This EEG study was done with scalp electrodes positioned according to the 10-20 International system of electrode placement. Electrical activity was acquired at a sampling rate of 500Hz  and reviewed with a high frequency filter of 70Hz  and a low frequency filter of 1Hz . EEG data were recorded continuously and digitally stored. Description: The posterior dominant rhythm consists of 8 Hz activity of moderate voltage (25-35 uV) seen predominantly in posterior head regions, symmetric and reactive to eye opening and eye closing. leep was characterized by vertex waves, sleep spindles (12 to 14 Hz), maximal frontocentral region.  Hyperventilation and photic stimulation were not performed.   IMPRESSION: This study is within normal limits. No seizures or epileptiform discharges were seen throughout the recording.   MR BRAIN WO CONTRAST  Result Date: 12/15/2021 CLINICAL DATA:  Initial evaluation for acute neuro deficit, stroke suspected. EXAM: MRI HEAD WITHOUT CONTRAST TECHNIQUE: Multiplanar, multiecho pulse sequences of the brain and surrounding structures were obtained without intravenous contrast. COMPARISON:  Prior CT from earlier the same day. FINDINGS: Brain: Cerebral volume within normal limits. No significant cerebral white matter disease or other focal parenchymal signal abnormality. No abnormal foci of restricted diffusion to suggest acute or subacute ischemia. Gray-white matter differentiation maintained. No areas of chronic cortical infarction. No acute or chronic intracranial blood products. No mass lesion, midline shift, or mass effect. No hydrocephalus. Cavum et septum pellucidum noted. No extra-axial fluid collection. Incidental note made of a tiny 3.5 mm parahippocampal cyst at the left hip campus (series 20, image 15), likely incidental in nature and of doubtful significance. No other intrinsic temporal lobe abnormality. Vascular: Major intracranial vascular flow voids are maintained. Skull and upper cervical spine: Craniocervical junction within normal limits. Bone marrow signal intensity normal. No scalp soft tissue abnormality. Sinuses/Orbits: Globes orbital soft tissues within normal limits. Paranasal sinuses are largely clear. No significant mastoid effusion. Other: None. IMPRESSION: Normal brain MRI.  No acute intracranial abnormality identified. Electronically Signed   By: M.D.   On: 12/15/2021 22:57   CT HEAD CODE STROKE WO CONTRAST  Result Date: 12/15/2021 CLINICAL DATA:  Code stroke. Provided history: Neuro deficit, acute, stroke suspected. Additional history provided:  Aphasia. EXAM: CT HEAD WITHOUT CONTRAST TECHNIQUE: Contiguous axial images were obtained from the base of the skull through the vertex without intravenous contrast. RADIATION DOSE REDUCTION: This exam was performed according to the departmental dose-optimization program which includes automated exposure control, adjustment of the mA and/or kV according to patient size and/or use of iterative reconstruction technique. COMPARISON:  Prior head CT examinations 12/02/2020 and earlier. Brain MRI 02/18/2013. FINDINGS: Brain: Cerebral volume is normal. Cavum septum pellucidum and cavum vergae. There is no acute intracranial hemorrhage. No demarcated cortical infarct.  No extra-axial fluid collection. No evidence of an intracranial mass. No midline shift. Vascular: No hyperdense vessel. Atherosclerotic calcifications. Skull: No fracture or aggressive osseous lesion. Sinuses/Orbits: No mass or acute finding within the imaged orbits. Minimal mucosal thickening scattered within the bilateral ethmoid sinuses. ASPECTS (Will Stroke Program Early CT Score) - Ganglionic level infarction (caudate, lentiform nuclei, internal capsule, insula, M1-M3 cortex): 7 - Supraganglionic infarction (M4-M6 cortex): 3 Total score (0-10 with 10 being normal): 10 These results were communicated to Dr. Cheral Marker at 6:04 pmon 6/27/2023by text page via the [facility] messaging system. IMPRESSION: No evidence of acute intracranial abnormality. ASPECTS is 10. Electronically Signed   By: Kellie Simmering D.O.   On: 12/15/2021 18:04       Hosie Poisson M.D. [facility] 12/16/2021, 5:57 PM  Available via Epic secure chat 7am-7pm After 7 pm, please refer to night coverage provider listed on amion.

## 2021-12-16 NOTE — TOC Initial Note (Signed)
Transition of Care [facility]) - Initial/Assessment Note    Patient Details  Name: Kirsten Walsh MRN: [id number] Date of Birth: 19-Nov-1960  Transition of Care [facility]) CM/SW Contact:    Kermit Balo, RN Phone Number: 12/16/2021, 4:17 PM  Clinical Narrative:                 Patient from home with son that is with her all the time. She drives self but son can assist.  Pt manages her own medications and denies any issues.  TOC following.  Expected Discharge Plan: Home/Self Care Barriers to Discharge: Continued Medical Work up   Patient Goals and CMS Choice        Expected Discharge Plan and Services Expected Discharge Plan: Home/Self Care       Living arrangements for the past 2 months: Single Family Home                                      Prior Living Arrangements/Services Living arrangements for the past 2 months: Single Family Home Lives with:: Adult Children Patient language and need for interpreter reviewed:: Yes Do you feel safe going back to the place where you live?: Yes            Criminal Activity/Legal Involvement Pertinent to Current Situation/Hospitalization: No - Comment as needed  Activities of Daily Living      Permission Sought/Granted                  Emotional Assessment Appearance:: Appears stated age Attitude/Demeanor/Rapport: Engaged Affect (typically observed): Accepting Orientation: : Oriented to Self, Oriented to Place, Oriented to  Time, Oriented to Situation   Psych Involvement: No (comment)  Admission diagnosis:  Aphasia [R47.01] Seizure (HCC) [R56.9] Seizure-like activity (HCC) [R56.9] Patient Active Problem List   Diagnosis Date Noted   Seizure-like activity (HCC) 12/15/2021   S/P TKR (total knee replacement), left 01/18/2021   H/O total knee replacement, left 01/16/2021   Cervical cancer screening 03/09/2020   Encounter for administration of vaccine 03/09/2020   Preoperative clearance 07/24/2019   Right  arm pain 06/12/2019   Syncope 07/06/2017   Chronic daily headache 09/02/2015   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   Syncopal episodes 09/02/2015   Right arm weakness 08/05/2015   Edema of right arm and right leg 09/07/2014   Intractable migraine with status migrainosus 06/10/2014   Chronic migraine without aura, intractable, with status migrainosus    Bradycardia 02/18/2013   Left knee pain 01/16/2013   Cardiomyopathy 10/20/2011   Leg swelling 09/23/2011   Complex regional pain syndrome of right lower extremity 05/03/2011   Chronic pain 03/29/2011   NECK PAIN, RIGHT 02/03/2010   ALLERGIC RHINITIS 01/30/2009   DIZZINESS 07/16/2008   Nonintractable chronic migraine 04/26/2007   OBESITY, NOS 08/18/2006   HYPERTENSION, BENIGN SYSTEMIC 08/18/2006   PCP:  Fleet Contras, MD Pharmacy:   [id number] [place], [place] - [address] AT [facility] & [address] [place] [address] Phone: [phone] Fax: [phone]     Social Determinants of Health (SDOH) Interventions    Readmission Risk Interventions     No data to display

## 2021-12-16 NOTE — Progress Notes (Addendum)
Subjective: No further episodes overnight.  Patient states he has headaches frequently every day, they are "very very" severe about 8 to 10 days a month and are associated with nausea, photophobia.  States she takes topiramate and has been on zonisamide and a lot of other medication as well as Botox in the past.  She also reports significant weakness in the right side of her body which has been gradually getting worse.    Lastly, patient also reports episodes of transient loss of awareness, can happen as often as once every day, last episode was yesterday, last for few seconds.  Denies tongue bite, urinary incontinence with these episodes.  Denies any family history of epilepsy  ROS: negative except above  Examination  Vital signs in last 24 hours: Temp:  [97.7 F (36.5 C)-98.3 F (36.8 C)] 97.7 F (36.5 C) (06/28 0734) Pulse Rate:  [44-66] 44 (06/28 0734) Resp:  [15-20] 16 (06/28 0734) BP: (107-154)/(59-85) 112/68 (06/28 0734) SpO2:  [97 %-100 %] 99 % (06/28 0734) Weight:  [95 kg] 95 kg (06/27 1746)  General: lying in bed, NAD Neuro: MS: Alert, oriented, follows commands CN: pupils equal and reactive,  EOMI, face symmetric, tongue midline, normal sensation over face, Motor: 5/5 strength in left upper and lower extremity, 4/5 in right upper and lower extremity (? lack of effort) reflexes: 2+ bilaterally over patella, biceps, plantars: flexor Coordination: normal Gait: not tested  Basic Metabolic Panel: Recent Labs  Lab 12/15/21 1850 12/15/21 1858 12/16/21 0445  NA 141 141 141  K 3.4* 3.4* 3.4*  CL 106 104 111  CO2 27  --  24  GLUCOSE 96 92 119*  BUN 7 7 5*  CREATININE 1.22* 1.20* 0.98  CALCIUM 8.9  --  8.7*  MG  --   --  1.9    CBC: Recent Labs  Lab 12/15/21 1850 12/15/21 1858 12/16/21 0445  WBC 4.9  --  3.5*  NEUTROABS 2.8  --   --   HGB 12.0 12.2 11.5*  HCT 37.8 36.0 34.5*  MCV 94.5  --  92.7  PLT 212  --  190     Coagulation Studies: Recent Labs     12/15/21 1850  LABPROT 13.7  INR 1.1    Imaging MRI brain without contrast 12/15/2021: Normal brain MRI.  No acute intracranial abnormality identified.  ASSESSMENT AND PLAN: 61 year old female presented with seizure-like activity and also reports episodes of transient loss of awareness as well as chronic migraine with aura.  Transient loss of awareness Chronic migraine without aura, refractory Right-sided hemiparesis -Unclear if patient has epilepsy or nonepileptic events -The right-sided weakness could be functional versus cervical radiculopathy as patient reports intermittent paresthesias in the right upper extremity  Recommendations -Discussed prolonged EEG monitoring for further evaluation of these episodes of alteration of awareness.  Patient states she needs to leave today but would be interested in epilepsy monitoring unit admission in the future. She will speak with her neurologist about it -Patient is already on topiramate at 150mg  BID.  Will increase to 200 mg twice daily -We will obtain MRI C-spine without contrast to look for cervical radiculopathy -Recommend follow-up with neurology in 2 to 4 weeks -Discussed seizure precautions including do not drive for 6 months/until cleared by physician -Discussed plan with Dr.  Seizure precautions: Per [facility] statutes, patients with seizures are not allowed to drive until they have been seizure-free for six months and cleared by a physician    Use caution  when using heavy equipment or power tools. Avoid working on ladders or at heights. Take showers instead of baths. Ensure the water temperature is not too high on the home water heater. Do not go swimming alone. Do not lock yourself in a room alone (i.e. bathroom). When caring for infants or small children, sit down when holding, feeding, or changing them to minimize risk of injury to the child in the event you have a seizure. Maintain good sleep hygiene. Avoid alcohol.     If patient has another seizure, call 911 and bring them back to the ED if: A.  The seizure lasts longer than 5 minutes.      B.  The patient doesn't wake shortly after the seizure or has new problems such as difficulty seeing, speaking or moving following the seizure C.  The patient was injured during the seizure D.  The patient has a temperature over 102 F (39C) E.  The patient vomited during the seizure and now is having trouble breathing    During the Seizure   - First, ensure adequate ventilation and place patients on the floor on their left side  Loosen clothing around the neck and ensure the airway is patent. If the patient is clenching the teeth, do not force the mouth open with any object as this can cause severe damage - Remove all items from the surrounding that can be hazardous. The patient may be oblivious to what's happening and may not even know what he or she is doing. If the patient is confused and wandering, either gently guide him/her away and block access to outside areas - Reassure the individual and be comforting - Call 911. In most cases, the seizure ends before EMS arrives. However, there are cases when seizures may last over 3 to 5 minutes. Or the individual may have developed breathing difficulties or severe injuries. If a pregnant patient or a person with diabetes develops a seizure, it is prudent to call an ambulance. - Finally, if the patient does not regain full consciousness, then call EMS. Most patients will remain confused for about 45 to 90 minutes after a seizure, so you must use judgment in calling for help.    After the Seizure (Postictal Stage)   After a seizure, most patients experience confusion, fatigue, muscle pain and/or a headache. Thus, one should permit the individual to sleep. For the next few days, reassurance is essential. Being calm and helping reorient the person is also of importance.   Most seizures are painless and end spontaneously.  Seizures are not harmful to others but can lead to complications such as stress on the lungs, brain and the heart. Individuals with prior lung problems may develop labored breathing and respiratory distress.     I have spent a total of 40  minutes with the patient reviewing hospital notes,  test results, labs and examining the patient as well as establishing an assessment and plan that was discussed personally with the patient.  > 50% of time was spent in direct patient care.   Lindie Spruce Epilepsy [facility] For questions after 5pm please refer to AMION to reach the Neurologist on call

## 2021-12-16 NOTE — H&P (Signed)
History and Physical    KARSTYN BIRKEY RJJ:[id number] DOB: 01-03-1961 DOA: 12/15/2021  PCP: Fleet Contras, MD   Patient coming from: Home   Chief Complaint: Seizure-like activity   HPI: Kirsten Walsh is a pleasant 61 y.o. female with medical history significant for chronic migraine, chronic back pain, hypertension, and mild renal insufficiency, now presenting to the emergency department after an episode of generalized seizure-like activity.  Patient reports that she woke with a migraine, similar to her prior migraines which include right-sided weakness.  She was able to go shopping at [employer] and states that she was talking with some ladies there when she apparently lost consciousness.  Bystanders stated that she appeared to start shaking, was lowered to the ground, and had tonic-clonic activity lasting approximately 2 minutes and followed by a 5-minute postictal period without incontinence or tongue biting.  EMS reports that the patient was dysarthric, had expressive aphasia, and seemed to be confused.  EMS also noted some right-sided weakness.  Patient denies any recent fever or chills, denies benzodiazepine use or any significant alcohol use.  Reports that there is nothing unusual about her migraine today.  ED Course: Upon arrival to the ED, patient is found to be saturating well on room air with stable blood pressure.  EKG features sinus rhythm.  Head CT negative for acute intracranial abnormality.  MRI brain was normal.  Chemistry panel notable for potassium 3.4 and creatinine 1.22.  Patient was given Benadryl, fentanyl, 2 L of IV fluids, and Phenergan in the emergency department, neurology evaluated the patient, and hospitalists asked to admit.  Review of Systems:  All other systems reviewed and apart from HPI, are negative.  Past Medical History:  Diagnosis Date   Arthritis    Chest pain    Chronic lower back pain    "right side to mid back" (07/06/2017)   Family history of  adverse reaction to anesthesia    "daughter did; not sure happened"   Gall stones    History of anaphylaxis 06/26/2014   History of kidney stones    Hypertension    Migraine    "qd" (07/06/2017)   Syncope and collapse 07/06/2017    sitting on the commode; developed nausea and passed out; hit head on shower and suffered a laceration; woke up in "a pool of blood"    Past Surgical History:  Procedure Laterality Date   CHONDROPLASTY Left 08/17/2019   Procedure: CHONDROPLASTY;  Surgeon: Frederico Hamman, MD;  Location: [facility];  Service: Orthopedics;  Laterality: Left;   CYSTOSCOPY W/ STONE MANIPULATION  ~ 2006   INTRAUTERINE DEVICE INSERTION     "initially put in in 04/2002; changed prn" (02/14/2013)   KNEE ARTHROSCOPY WITH LATERAL MENISECTOMY  08/17/2019   Procedure: KNEE ARTHROSCOPY WITH LATERAL MENISECTOMY;  Surgeon: Frederico Hamman, MD;  Location: [facility];  Service: Orthopedics;;   KNEE ARTHROSCOPY WITH MEDIAL MENISECTOMY Left 08/17/2019   Procedure: KNEE ARTHROSCOPY WITH MEDIAL MENISECTOMY;  Surgeon: Frederico Hamman, MD;  Location:  SURGERY CENTER;  Service: Orthopedics;  Laterality: Left;   LAPAROSCOPIC CHOLECYSTECTOMY  ~ 2004   TOTAL KNEE ARTHROPLASTY Left 01/16/2021   Procedure: TOTAL KNEE ARTHROPLASTY;  Surgeon: Beverely Low, MD;  Location: [facility];  Service: Orthopedics;  Laterality: Left;  with adductor canal    Social History:   reports that she has never smoked. She has never used smokeless tobacco. She reports current alcohol use. She reports that she does not use drugs.  Allergies  Allergen Reactions   Apple Anaphylaxis and Swelling   Aspirin Anaphylaxis and Swelling   Daucus Carota Anaphylaxis and Swelling   Haloperidol And Related Anaphylaxis, Swelling and Other (See Comments)    Lock jaw and slurred speech    Family History  Problem Relation Age of Onset   Cancer Mother    Pancreatic cancer Mother    Cirrhosis Father     Lupus Sister    Diabetes Maternal Grandmother      Prior to Admission medications   Medication Sig Start Date End Date Taking? Authorizing Provider  Butalbital-APAP-Caffeine 50-325-40 MG capsule Take 1 capsule by mouth every 6 (six) hours as needed for headache (migraine). 09/04/20  Yes [provider]  cetirizine (ZYRTEC) 10 MG tablet Take 10 mg by mouth daily as needed for allergies.   Yes [provider]  cloNIDine (CATAPRES) 0.1 MG tablet TAKE 1 TABLET(0.1 MG) BY MOUTH TWICE DAILY Patient taking differently: Take 0.1 mg by mouth 2 (two) times daily. 08/28/18  Yes Howard Pouch, MD  Diclofenac Sodium 3 % GEL Apply 1 application topically 2 (two) times daily as needed (pain). 09/06/20  Yes [provider]  EPINEPHrine 0.3 mg/0.3 mL IJ SOAJ injection Inject 0.3 mg into the muscle as needed for anaphylaxis. 09/18/20  Yes [provider]  fluticasone (FLONASE) 50 MCG/ACT nasal spray Place 1 spray into both nostrils daily as needed for allergies or rhinitis.   Yes [provider]  gabapentin (NEURONTIN) 300 MG capsule Take 300 mg by mouth 2 (two) times daily. 08/26/20  Yes [provider]  hydrochlorothiazide (HYDRODIURIL) 25 MG tablet TAKE 1 TABLET(25 MG) BY MOUTH DAILY Patient taking differently: Take 25 mg by mouth daily. 08/28/18  Yes Howard Pouch, MD  ibuprofen (ADVIL) 800 MG tablet Take 800 mg by mouth every 8 (eight) hours as needed for moderate pain. 12/07/21  Yes [provider]  lidocaine (XYLOCAINE) 5 % ointment Apply 1 Application topically daily as needed for moderate pain. 10/19/21  Yes [provider]  ondansetron (ZOFRAN) 4 MG tablet Take 4 mg by mouth 3 (three) times daily as needed for nausea or vomiting.   Yes [provider]  oxyCODONE (ROXICODONE) 15 MG immediate release tablet Take 15 mg by mouth every 6 (six) hours. 09/04/20  Yes [provider]  pantoprazole (PROTONIX) 40 MG tablet Take 40 mg by  mouth in the morning.   Yes [provider]  Phendimetrazine Tartrate 105 MG CP24 Take 105 mg by mouth daily as needed (weight loss). Last dose approx 12/22/20 per pt 09/21/20  Yes [provider]  polyethylene glycol (MIRALAX / GLYCOLAX) 17 g packet Take 17 g by mouth daily as needed for moderate constipation.   Yes [provider]  potassium chloride (KLOR-CON) 10 MEQ tablet Take 2 tablets (20 mEq total) by mouth daily. Patient taking differently: Take 10 mEq by mouth daily. 02/20/20  Yes Dana Allan, MD  potassium chloride SA (KLOR-CON M) 20 MEQ tablet Take 20 mEq by mouth daily.   Yes [provider]  promethazine (PHENERGAN) 25 MG tablet Take 1 tablet (25 mg total) by mouth every 6 (six) hours as needed for nausea or vomiting. 04/30/19  Yes Wieters, Hallie C, PA-C  SUMAtriptan (IMITREX) 100 MG tablet Take 100 mg by mouth every 2 (two) hours as needed for migraine. May repeat in 2 hours if headache persists or recurs.   Yes [provider]  tiZANidine (ZANAFLEX) 4 MG capsule Take 4 mg by  mouth at bedtime. 09/11/21  Yes [provider]  topiramate (TOPAMAX) 100 MG tablet TAKE 1 AND 1/2 TABLETS(150 MG) BY MOUTH TWICE DAILY 08/01/18  Yes Howard PouchFeng, Lauren, MD  traZODone (DESYREL) 100 MG tablet Take 100 mg by mouth at bedtime as needed for sleep. 12/07/21  Yes [provider]  Vitamin D, Ergocalciferol, (DRISDOL) 1.25 MG (50000 UNIT) CAPS capsule Take 50,000 Units by mouth every Monday. 09/14/20  Yes [provider]  promethazine (PHENERGAN) 25 MG suppository Place 1 suppository (25 mg total) rectally every 8 (eight) hours as needed for up to 6 doses for nausea or vomiting. Patient not taking: Reported on 12/15/2021 12/02/20   Terald Sleeperrifan, Matthew J, MD    Physical Exam: Vitals:   12/15/21 1945 12/15/21 2130 12/15/21 2318 12/15/21 2347  BP: 128/82 139/76 140/77 137/74  Pulse: 65 (!) 57 (!) 56 (!) 58  Resp: 17 15  17   Temp:    98.3 F (36.8 C)   TempSrc:    Oral  SpO2: 100% 100% 97% 100%  Weight:        Constitutional: NAD, calm  Eyes: PERTLA, lids and conjunctivae normal ENMT: Mucous membranes are moist. Posterior pharynx clear of any exudate or lesions.   Neck: supple, no masses  Respiratory: no wheezing, no crackles. No accessory muscle use.  Cardiovascular: S1 & S2 heard, regular rate and rhythm. No extremity edema.   Abdomen: No distension, no tenderness, soft. Bowel sounds active.  Musculoskeletal: no clubbing / cyanosis. No joint deformity upper and lower extremities.   Skin: no significant rashes, lesions, ulcers. Warm, dry, well-perfused. Neurologic: CN 2-12 grossly intact. Sensation to light touch intact. Strength 5/5 in all 4 limbs. Alert and oriented.  Psychiatric: Pleasant. Cooperative.    Labs and Imaging on Admission: I have personally reviewed following labs and imaging studies  CBC: Recent Labs  Lab 12/15/21 1850 12/15/21 1858  WBC 4.9  --   NEUTROABS 2.8  --   HGB 12.0 12.2  HCT 37.8 36.0  MCV 94.5  --   PLT 212  --    Basic Metabolic Panel: Recent Labs  Lab 12/15/21 1850 12/15/21 1858  NA 141 141  K 3.4* 3.4*  CL 106 104  CO2 27  --   GLUCOSE 96 92  BUN 7 7  CREATININE 1.22* 1.20*  CALCIUM 8.9  --    GFR: Estimated Creatinine Clearance: 58.2 mL/min (A) (by C-G formula based on SCr of 1.2 mg/dL (H)). Liver Function Tests: Recent Labs  Lab 12/15/21 1850  AST 22  ALT 17  ALKPHOS 68  BILITOT 0.6  PROT 6.2*  ALBUMIN 3.4*   No results for input(s): "LIPASE", "AMYLASE" in the last 168 hours. No results for input(s): "AMMONIA" in the last 168 hours. Coagulation Profile: Recent Labs  Lab 12/15/21 1850  INR 1.1   Cardiac Enzymes: No results for input(s): "CKTOTAL", "CKMB", "CKMBINDEX", "TROPONINI" in the last 168 hours. BNP (last 3 results) No results for input(s): "PROBNP" in the last 8760 hours. HbA1C: No results for input(s): "HGBA1C" in the last 72 hours. CBG: Recent  Labs  Lab 12/15/21 1741  GLUCAP 120*   Lipid Profile: No results for input(s): "CHOL", "HDL", "LDLCALC", "TRIG", "CHOLHDL", "LDLDIRECT" in the last 72 hours. Thyroid Function Tests: No results for input(s): "TSH", "T4TOTAL", "FREET4", "T3FREE", "THYROIDAB" in the last 72 hours. Anemia Panel: No results for input(s): "VITAMINB12", "FOLATE", "FERRITIN", "TIBC", "IRON", "RETICCTPCT" in the last 72 hours. Urine analysis:    Component Value Date/Time  COLORURINE YELLOW 07/15/2015 2236   APPEARANCEUR TURBID (A) 07/15/2015 2236   LABSPEC 1.013 07/15/2015 2236   PHURINE 8.5 (H) 07/15/2015 2236   GLUCOSEU NEGATIVE 07/15/2015 2236   HGBUR NEGATIVE 07/15/2015 2236   HGBUR negative 09/29/2006 1525   BILIRUBINUR NEGATIVE 07/15/2015 2236   KETONESUR NEGATIVE 07/15/2015 2236   PROTEINUR NEGATIVE 07/15/2015 2236   UROBILINOGEN 0.2 01/28/2015 0401   NITRITE NEGATIVE 07/15/2015 2236   LEUKOCYTESUR TRACE (A) 07/15/2015 2236   Sepsis Labs: @LABRCNTIP (procalcitonin:4,lacticidven:4) )No results found for this or any previous visit (from the past 240 hour(s)).   Radiological Exams on Admission: MR BRAIN WO CONTRAST  Result Date: 12/15/2021 CLINICAL DATA:  Initial evaluation for acute neuro deficit, stroke suspected. EXAM: MRI HEAD WITHOUT CONTRAST TECHNIQUE: Multiplanar, multiecho pulse sequences of the brain and surrounding structures were obtained without intravenous contrast. COMPARISON:  Prior CT from earlier the same day. FINDINGS: Brain: Cerebral volume within normal limits. No significant cerebral white matter disease or other focal parenchymal signal abnormality. No abnormal foci of restricted diffusion to suggest acute or subacute ischemia. Gray-white matter differentiation maintained. No areas of chronic cortical infarction. No acute or chronic intracranial blood products. No mass lesion, midline shift, or mass effect. No hydrocephalus. Cavum et septum pellucidum noted. No extra-axial fluid  collection. Incidental note made of a tiny 3.5 mm parahippocampal cyst at the left hip campus (series 20, image 15), likely incidental in nature and of doubtful significance. No other intrinsic temporal lobe abnormality. Vascular: Major intracranial vascular flow voids are maintained. Skull and upper cervical spine: Craniocervical junction within normal limits. Bone marrow signal intensity normal. No scalp soft tissue abnormality. Sinuses/Orbits: Globes orbital soft tissues within normal limits. Paranasal sinuses are largely clear. No significant mastoid effusion. Other: None. IMPRESSION: Normal brain MRI.  No acute intracranial abnormality identified. Electronically Signed   By: 12/17/2021 M.D.   On: 12/15/2021 22:57   CT HEAD CODE STROKE WO CONTRAST  Result Date: 12/15/2021 CLINICAL DATA:  Code stroke. Provided history: Neuro deficit, acute, stroke suspected. Additional history provided: Aphasia. EXAM: CT HEAD WITHOUT CONTRAST TECHNIQUE: Contiguous axial images were obtained from the base of the skull through the vertex without intravenous contrast. RADIATION DOSE REDUCTION: This exam was performed according to the departmental dose-optimization program which includes automated exposure control, adjustment of the mA and/or kV according to patient size and/or use of iterative reconstruction technique. COMPARISON:  Prior head CT examinations 12/02/2020 and earlier. Brain MRI 02/18/2013. FINDINGS: Brain: Cerebral volume is normal. Cavum septum pellucidum and cavum vergae. There is no acute intracranial hemorrhage. No demarcated cortical infarct. No extra-axial fluid collection. No evidence of an intracranial mass. No midline shift. Vascular: No hyperdense vessel. Atherosclerotic calcifications. Skull: No fracture or aggressive osseous lesion. Sinuses/Orbits: No mass or acute finding within the imaged orbits. Minimal mucosal thickening scattered within the bilateral ethmoid sinuses. ASPECTS ([place]  Stroke Program Early CT Score) - Ganglionic level infarction (caudate, lentiform nuclei, internal capsule, insula, M1-M3 cortex): 7 - Supraganglionic infarction (M4-M6 cortex): 3 Total score (0-10 with 10 being normal): 10 These results were communicated to Dr. 02/20/2013 at 6:04 pmon 6/27/2023by text page via the [facility] messaging system. IMPRESSION: No evidence of acute intracranial abnormality. ASPECTS is 10. Electronically Signed   By: [facility] D.O.   On: 12/15/2021 18:04    EKG: Independently reviewed. Sinus rhythm.   Assessment/Plan   1. Seizure-like episode  - Presents after an episode of generalized shaking for ~2 minutes followed by ~5 minute postictal period,  no incontinence of apparent tongue bite or other injury  - No acute findings on head CT or MRI brain  - Seizure precautions, check EEG, follow-up neurology recommendations   2. Chronic migraine  - Continue Topamax and as needed Imitrex   3. Chronic pain  - Prescription database reviewed  - Continue home regimen    4. HTN   - Continue clonidine    5. CKD II  - SCr is 1.22 in ED; baseline appears closer to 1 or 1.1  - Renally-dose medications, monitor    DVT prophylaxis: Lovenox  Code Status: Full  Level of Care: Level of care: Telemetry Medical Family Communication: none present  Disposition Plan:  Patient is from: home  Anticipated d/c is to: Home  Anticipated d/c date is: 6/28 or 12/17/21  Patient currently: Pending EEG, neurology consultation  Consults called: neurology  Admission status: Observation     Briscoe Deutscher, MD [facility]  12/16/2021, 12:27 AM

## 2021-12-16 NOTE — Procedures (Signed)
Patient Name: Kirsten Walsh  MRN: [id number]  Epilepsy Attending: Charlsie Quest  Referring Physician/Provider: Caryl Pina, MD  Date: 12/16/2021 Duration: 21.20 mins  Patient history: 61 year old female with seizure-like activity.  EEG to evaluate for seizure.  Level of alertness: Awake, asleep  AEDs during EEG study: TPM  Technical aspects: This EEG study was done with scalp electrodes positioned according to the 10-20 International system of electrode placement. Electrical activity was acquired at a sampling rate of 500Hz  and reviewed with a high frequency filter of 70Hz  and a low frequency filter of 1Hz . EEG data were recorded continuously and digitally stored.   Description: The posterior dominant rhythm consists of 8 Hz activity of moderate voltage (25-35 uV) seen predominantly in posterior head regions, symmetric and reactive to eye opening and eye closing. leep was characterized by vertex waves, sleep spindles (12 to 14 Hz), maximal frontocentral region. Hyperventilation and photic stimulation were not performed.     IMPRESSION: This study is within normal limits. No seizures or epileptiform discharges were seen throughout the recording.  Eltha Tingley 

## 2021-12-17 DIAGNOSIS — R569 Unspecified convulsions: Secondary | ICD-10-CM

## 2021-12-17 DIAGNOSIS — Z886 Allergy status to analgesic agent status: Secondary | ICD-10-CM | POA: Diagnosis not present

## 2021-12-17 DIAGNOSIS — Z888 Allergy status to other drugs, medicaments and biological substances status: Secondary | ICD-10-CM | POA: Diagnosis not present

## 2021-12-17 DIAGNOSIS — M1990 Unspecified osteoarthritis, unspecified site: Secondary | ICD-10-CM | POA: Diagnosis present

## 2021-12-17 DIAGNOSIS — G894 Chronic pain syndrome: Secondary | ICD-10-CM | POA: Diagnosis not present

## 2021-12-17 DIAGNOSIS — R4701 Aphasia: Secondary | ICD-10-CM | POA: Diagnosis not present

## 2021-12-17 DIAGNOSIS — Z6834 Body mass index (BMI) 34.0-34.9, adult: Secondary | ICD-10-CM | POA: Diagnosis not present

## 2021-12-17 DIAGNOSIS — G43909 Migraine, unspecified, not intractable, without status migrainosus: Secondary | ICD-10-CM | POA: Diagnosis not present

## 2021-12-17 DIAGNOSIS — I129 Hypertensive chronic kidney disease with stage 1 through stage 4 chronic kidney disease, or unspecified chronic kidney disease: Secondary | ICD-10-CM | POA: Diagnosis not present

## 2021-12-17 DIAGNOSIS — Z419 Encounter for procedure for purposes other than remedying health state, unspecified: Secondary | ICD-10-CM | POA: Diagnosis not present

## 2021-12-17 DIAGNOSIS — I429 Cardiomyopathy, unspecified: Secondary | ICD-10-CM | POA: Diagnosis not present

## 2021-12-17 DIAGNOSIS — Z79899 Other long term (current) drug therapy: Secondary | ICD-10-CM | POA: Diagnosis not present

## 2021-12-17 DIAGNOSIS — Z79891 Long term (current) use of opiate analgesic: Secondary | ICD-10-CM | POA: Diagnosis not present

## 2021-12-17 DIAGNOSIS — N179 Acute kidney failure, unspecified: Secondary | ICD-10-CM | POA: Diagnosis not present

## 2021-12-17 DIAGNOSIS — G8191 Hemiplegia, unspecified affecting right dominant side: Secondary | ICD-10-CM | POA: Diagnosis not present

## 2021-12-17 DIAGNOSIS — E669 Obesity, unspecified: Secondary | ICD-10-CM | POA: Diagnosis not present

## 2021-12-17 DIAGNOSIS — I10 Essential (primary) hypertension: Secondary | ICD-10-CM | POA: Diagnosis not present

## 2021-12-17 DIAGNOSIS — J309 Allergic rhinitis, unspecified: Secondary | ICD-10-CM | POA: Diagnosis present

## 2021-12-17 DIAGNOSIS — E876 Hypokalemia: Secondary | ICD-10-CM | POA: Diagnosis not present

## 2021-12-17 DIAGNOSIS — Z9049 Acquired absence of other specified parts of digestive tract: Secondary | ICD-10-CM | POA: Diagnosis not present

## 2021-12-17 DIAGNOSIS — E860 Dehydration: Secondary | ICD-10-CM | POA: Diagnosis not present

## 2021-12-17 DIAGNOSIS — Z91018 Allergy to other foods: Secondary | ICD-10-CM | POA: Diagnosis not present

## 2021-12-17 DIAGNOSIS — G43709 Chronic migraine without aura, not intractable, without status migrainosus: Secondary | ICD-10-CM | POA: Diagnosis not present

## 2021-12-17 DIAGNOSIS — K5900 Constipation, unspecified: Secondary | ICD-10-CM | POA: Diagnosis not present

## 2021-12-17 DIAGNOSIS — Z96652 Presence of left artificial knee joint: Secondary | ICD-10-CM | POA: Diagnosis present

## 2021-12-17 DIAGNOSIS — N182 Chronic kidney disease, stage 2 (mild): Secondary | ICD-10-CM | POA: Diagnosis not present

## 2021-12-17 DIAGNOSIS — M5126 Other intervertebral disc displacement, lumbar region: Secondary | ICD-10-CM | POA: Diagnosis present

## 2021-12-17 LAB — POTASSIUM: Potassium: 5.4 mmol/L — ABNORMAL HIGH (ref 3.5–5.1)

## 2021-12-17 MED ORDER — SENNOSIDES-DOCUSATE SODIUM 8.6-50 MG PO TABS
2.0000 | ORAL_TABLET | Freq: Two times a day (BID) | ORAL | Status: DC
  Administered 2021-12-17 – 2021-12-18 (×3): 2 via ORAL
  Filled 2021-12-17 (×4): qty 2

## 2021-12-17 MED ORDER — POLYETHYLENE GLYCOL 3350 17 G PO PACK
17.0000 g | PACK | Freq: Two times a day (BID) | ORAL | Status: DC
  Administered 2021-12-17 – 2021-12-18 (×3): 17 g via ORAL
  Filled 2021-12-17 (×4): qty 1

## 2021-12-17 MED ORDER — POTASSIUM CHLORIDE CRYS ER 20 MEQ PO TBCR
40.0000 meq | EXTENDED_RELEASE_TABLET | Freq: Once | ORAL | Status: DC
  Filled 2021-12-17: qty 2

## 2021-12-17 NOTE — Evaluation (Signed)
Physical Therapy Evaluation Patient Details Name: Kirsten Walsh MRN: [id number] DOB: [birth year] Today's Date: 12/17/2021  History of Present Illness  Pt is a 61 y/o female admitted secondary to seizure like activity. MRI negative. PMH includes HTN and migraines.  Clinical Impression  Pt admitted secondary to problem above with deficits below. Pt with increased pain in R hip and required standing rests X2 during gait in the room. Educated about stretching and nerve glide exercises and use of cane to help with pain management. Recommending outpatient PT at d/c to address current deficits. Will continue to follow acutely.        Recommendations for follow up therapy are one component of a multi-disciplinary discharge planning process, led by the attending physician.  Recommendations may be updated based on patient status, additional functional criteria and insurance authorization.  Follow Up Recommendations Outpatient PT      Assistance Recommended at Discharge Intermittent Supervision/Assistance  Patient can return home with the following  Help with stairs or ramp for entrance;Assist for transportation;Assistance with cooking/housework;A little help with bathing/dressing/bathroom    Equipment Recommendations None recommended by PT  Recommendations for Other Services       Functional Status Assessment Patient has had a recent decline in their functional status and demonstrates the ability to make significant improvements in function in a reasonable and predictable amount of time.     Precautions / Restrictions Precautions Precautions: Fall Restrictions Weight Bearing Restrictions: No      Mobility  Bed Mobility Overal bed mobility: Needs Assistance Bed Mobility: Supine to Sit, Sit to Supine     Supine to sit: Min assist Sit to supine: Supervision   General bed mobility comments: Min A for trunk elevation. Increased time secondary to pain.    Transfers Overall  transfer level: Needs assistance Equipment used: 1 person hand held assist Transfers: Sit to/from Stand Sit to Stand: Min guard           General transfer comment: Min guard for safety    Ambulation/Gait Ambulation/Gait assistance: Min guard Gait Distance (Feet): 20 Feet Assistive device: 1 person hand held assist Gait Pattern/deviations: Step-through pattern, Decreased stride length, Antalgic Gait velocity: Decreased     General Gait Details: Increased pain in RLE and required 2 standing rests. Pt reports spasming type sensation. Educated about using cane for increased safety.  Stairs            Wheelchair Mobility    Modified Rankin (Stroke Patients Only)       Balance Overall balance assessment: Needs assistance Sitting-balance support: No upper extremity supported, Feet supported Sitting balance-Leahy Scale: Fair     Standing balance support: Single extremity supported, No upper extremity supported Standing balance-Leahy Scale: Fair Standing balance comment: able to maintain static standing without UE support                             Pertinent Vitals/Pain      Home Living Family/patient expects to be discharged to:: Private residence Living Arrangements: Children Available Help at Discharge: Family;Available 24 hours/day Type of Home: House Home Access: Stairs to enter Entrance Stairs-Rails: None Entrance Stairs-Number of Steps: 4 Alternate Level Stairs-Number of Steps: 2 flights Home Layout: Multi-level;Able to live on main level with bedroom/bathroom;Full bath on main level;Bed/bath upstairs Home Equipment: BSC/3in1;Rolling Walker (2 wheels);Cane - single point      Prior Function Prior Level of Function : Independent/Modified Independent  Hand Dominance        Extremity/Trunk Assessment   Upper Extremity Assessment Upper Extremity Assessment: Overall WFL for tasks assessed    Lower Extremity  Assessment Lower Extremity Assessment: RLE deficits/detail RLE Deficits / Details: Reporting increased pain in R bottom down through leg and wrapping to the front of hip    Cervical / Trunk Assessment Cervical / Trunk Assessment: Normal  Communication   Communication: No difficulties  Cognition Arousal/Alertness: Awake/alert Behavior During Therapy: WFL for tasks assessed/performed Overall Cognitive Status: No family/caregiver present to determine baseline cognitive functioning                                          General Comments      Exercises Other Exercises Other Exercises: Educated about and performed RLE nerve glides in supine to help with pain.   Assessment/Plan    PT Assessment Patient needs continued PT services  PT Problem List Decreased strength;Decreased activity tolerance;Decreased range of motion;Decreased balance;Decreased mobility;Pain       PT Treatment Interventions DME instruction;Gait training;Stair training;Functional mobility training;Therapeutic activities;Therapeutic exercise;Balance training;Patient/family education    PT Goals (Current goals can be found in the Care Plan section)  Acute Rehab PT Goals Patient Stated Goal: to decrease pain and go home PT Goal Formulation: With patient Time For Goal Achievement: 12/31/21 Potential to Achieve Goals: Good    Frequency Min 3X/week     Co-evaluation               AM-PAC PT "6 Clicks" Mobility  Outcome Measure Help needed turning from your back to your side while in a flat bed without using bedrails?: A Little Help needed moving from lying on your back to sitting on the side of a flat bed without using bedrails?: A Little Help needed moving to and from a bed to a chair (including a wheelchair)?: A Little Help needed standing up from a chair using your arms (e.g., wheelchair or bedside chair)?: A Little Help needed to walk in hospital room?: A Little Help needed climbing  3-5 steps with a railing? : A Little 6 Click Score: 18    End of Session   Activity Tolerance: Patient limited by pain Patient left: in bed;with call bell/phone within reach;with bed alarm set Nurse Communication: Mobility status PT Visit Diagnosis: Other abnormalities of gait and mobility (R26.89);Pain Pain - Right/Left: Right Pain - part of body: Hip    Time: [id number] PT Time Calculation (min) (ACUTE ONLY): 23 min   Charges:   PT Evaluation $PT Eval Moderate Complexity: 1 Mod PT Treatments $Gait Training: 8-22 mins        Cindee Salt, DPT  Acute Rehabilitation Services  Office: [phone]   Lehman Prom 12/17/2021, 1:49 PM

## 2021-12-17 NOTE — Progress Notes (Signed)
[facility]                                                                               Kirsten Walsh, is a 61 y.o. female, DOB - 07-Jun-1961, VZC:[id number] Admit date - 12/15/2021    Outpatient Primary MD for the patient is Fleet Contras, MD  LOS - 0  days    Brief summary     Kirsten Walsh is a pleasant 61 y.o. female with medical history significant for chronic migraine, chronic back pain, hypertension, and mild renal insufficiency, now presenting to the emergency department after an episode of generalized seizure-like activity.  Patient reports that she woke with a migraine, similar to her prior migraines which include right-sided weakness.  Head CT negative for acute intracranial abnormality.  MRI brain was normal. neurology evaluated the patient, and hospitalists asked to admit.    Assessment & Plan    Assessment and Plan:  Seizure like episode:  None in the last 24 hours.  Prolonged EEG discussed by the neurologist.  Continue with topamax to 200 mg BID.  MRI c spine shows mild foraminal narrowing on the left.  Recommend outpatient follow up with Neurology next month .     Headache/ Migraine:  - pain control.    Lower back pain:  X rays are negative for acute pathology.  When pain is well controlled, plan for discharge . Therapy evaluations are pending.   Chronic pain syndrome:  Resume home meds.    Hypertension:  BP parameters are well controlled.    Stage 2 CKD:  Creatinine at baseline.   Hypokalemia:  Replaced.    MILD AKI Probably from dehydration. Resolved with IV fluids.     Code Status: full code.  DVT Prophylaxis:  enoxaparin (LOVENOX) injection 40 mg Start: 12/16/21 1000   Level of Care: Level of care: Telemetry Medical Family Communication: none at bedside.   Disposition Plan:     Remains inpatient appropriate: IV pain meds   Procedures:  MRI brain MRI  cervical spine.   Consultants:   Neurology.    Antimicrobials:   Anti-infectives (From admission, onward)    None        Medications  Scheduled Meds:  cloNIDine  0.1 mg Oral BID   enoxaparin (LOVENOX) injection  40 mg Subcutaneous Q24H   gabapentin  300 mg Oral BID   pantoprazole  40 mg Oral Daily   polyethylene glycol  17 g Oral BID   potassium chloride SA  20 mEq Oral Daily   senna-docusate  2 tablet Oral BID   sodium chloride flush  3 mL Intravenous Q12H   tiZANidine  4 mg Oral QHS   topiramate  200 mg Oral BID   Continuous Infusions:  methocarbamol (ROBAXIN) IV 500 mg (12/16/21 1729)   PRN Meds:.acetaminophen **OR** acetaminophen, diclofenac Sodium, fentaNYL (SUBLIMAZE) injection, lidocaine, methocarbamol (ROBAXIN) IV, ondansetron **OR** ondansetron (ZOFRAN) IV, oxyCODONE, SUMAtriptan, traZODone    Subjective:   Kirsten Walsh was seen and examined today.   Intermittent back pain.  Objective:   Vitals:   12/17/21 0309 12/17/21 0750 12/17/21 1211 12/17/21 1500  BP: 116/60 (!) 98/59 (!) 111/59 120/60  Pulse: Marland Kitchen)  56 (!) 52 (!) 58 60  Resp: 15 16  16   Temp: 98.2 F (36.8 C)  98.5 F (36.9 C) 98.6 F (37 C)  TempSrc: Oral  Oral Oral  SpO2: 100% 98% 99% 100%  Weight:        Intake/Output Summary (Last 24 hours) at 12/17/2021 1702 Last data filed at 12/17/2021 1500 Gross per 24 hour  Intake 830 ml  Output --  Net 830 ml    Filed Weights   12/15/21 1746  Weight: 95 kg     Exam General exam: Appears calm and comfortable  Respiratory system: Clear to auscultation. Respiratory effort normal. Cardiovascular system: S1 & S2 heard, RRR. No pedal edema. Gastrointestinal system: Abdomen is nondistended, soft and nontender. Normal bowel sounds heard. Central nervous system: Alert and oriented. No focal neurological deficits. Extremities: Symmetric 5 x 5 power. Skin: No rashes, lesions or ulcers Psychiatry: Mood & affect appropriate.    Data Reviewed:  I have personally reviewed following labs and  imaging studies   CBC Lab Results  Component Value Date   WBC 3.5 (L) 12/16/2021   RBC 3.72 (L) 12/16/2021   HGB 11.5 (L) 12/16/2021   HCT 34.5 (L) 12/16/2021   MCV 92.7 12/16/2021   MCH 30.9 12/16/2021   PLT 190 12/16/2021   MCHC 33.3 12/16/2021   RDW 14.0 12/16/2021   LYMPHSABS 1.6 12/15/2021   MONOABS 0.4 12/15/2021   EOSABS 0.1 12/15/2021   BASOSABS 0.0 12/15/2021     Last metabolic panel Lab Results  Component Value Date   NA 141 12/16/2021   K 3.4 (L) 12/16/2021   CL 111 12/16/2021   CO2 24 12/16/2021   BUN 5 (L) 12/16/2021   CREATININE 0.98 12/16/2021   GLUCOSE 119 (H) 12/16/2021   GFRNONAA >60 12/16/2021   GFRAA 72 08/14/2020   CALCIUM 8.7 (L) 12/16/2021   PHOS 1.8 (L) 07/06/2017   PROT 6.2 (L) 12/15/2021   ALBUMIN 3.4 (L) 12/15/2021   BILITOT 0.6 12/15/2021   ALKPHOS 68 12/15/2021   AST 22 12/15/2021   ALT 17 12/15/2021   ANIONGAP 6 12/16/2021    CBG (last 3)  Recent Labs    12/15/21 1741  GLUCAP 120*       Coagulation Profile: Recent Labs  Lab 12/15/21 1850  INR 1.1      Radiology Studies: DG Lumbar Spine 2-3 Views  Result Date: 12/16/2021 CLINICAL DATA:  Back pain. EXAM: LUMBAR SPINE - 2-3 VIEW COMPARISON:  Lumbar spine radiographs 05/14/2020. FINDINGS: There are 5 lumbar type vertebral bodies. The alignment is stable with a minimal convex right scoliosis which may be positional. No evidence of acute fracture or pars defect. There is mild multilevel disc space narrowing and intervertebral spurring, greatest at L2-3, L3-4 and L5-S1. Intrauterine device and cholecystectomy clips are noted. IMPRESSION: Mild lumbar spondylosis.  No acute osseous findings. Electronically Signed   By: 05/16/2020 M.D.   On: 12/16/2021 16:37   MR CERVICAL SPINE WO CONTRAST  Result Date: 12/16/2021 CLINICAL DATA:  Cervical radiculopathy. EXAM: MRI CERVICAL SPINE WITHOUT CONTRAST TECHNIQUE: Multiplanar, multisequence MR imaging of the cervical spine was  performed. No intravenous contrast was administered. COMPARISON:  MRI cervical spine 01/13/2010. CT cervical spine 05/14/2020 FINDINGS: Alignment: Normal Vertebrae: Normal bone marrow.  Negative for fracture or mass Cord: Cord evaluation limited by motion. Allowing for motion, no cord signal abnormality identified. Posterior Fossa, vertebral arteries, paraspinal tissues: Negative Disc levels: C2-3: Negative C3-4: Mild disc bulging.  Negative for  stenosis C4-5: Negative C5-6: Mild disc degeneration with mild disc space narrowing and disc desiccation. Mild to moderate left foraminal narrowing due to uncinate spurring. Right foramen patent. Central canal patent C6-7: Mild disc degeneration. Diffuse uncinate spurring. Mild left foraminal narrowing due to spurring. Central canal patent C7-T1: Negative IMPRESSION: Disc degeneration and spurring on the left at C5-6 and C6-7 causing left foraminal narrowing. Electronically Signed   By: Franchot Gallo M.D.   On: 12/16/2021 13:36   EEG adult  Result Date: 12/16/2021 Lora Havens, MD     12/16/2021  8:27 AM Patient Name: Kirsten Walsh MRN: [id number] Epilepsy Attending: Lora Havens Referring Physician/Provider: Kerney Elbe, MD Date: 12/16/2021 Duration: 21.20 mins Patient history: 61 year old female with seizure-like activity.  EEG to evaluate for seizure. Level of alertness: Awake, asleep AEDs during EEG study: TPM Technical aspects: This EEG study was done with scalp electrodes positioned according to the 10-20 International system of electrode placement. Electrical activity was acquired at a sampling rate of 500Hz  and reviewed with a high frequency filter of 70Hz  and a low frequency filter of 1Hz . EEG data were recorded continuously and digitally stored. Description: The posterior dominant rhythm consists of 8 Hz activity of moderate voltage (25-35 uV) seen predominantly in posterior head regions, symmetric and reactive to eye opening and eye closing. leep  was characterized by vertex waves, sleep spindles (12 to 14 Hz), maximal frontocentral region. Hyperventilation and photic stimulation were not performed.   IMPRESSION: This study is within normal limits. No seizures or epileptiform discharges were seen throughout the recording. Lora Havens   MR BRAIN WO CONTRAST  Result Date: 12/15/2021 CLINICAL DATA:  Initial evaluation for acute neuro deficit, stroke suspected. EXAM: MRI HEAD WITHOUT CONTRAST TECHNIQUE: Multiplanar, multiecho pulse sequences of the brain and surrounding structures were obtained without intravenous contrast. COMPARISON:  Prior CT from earlier the same day. FINDINGS: Brain: Cerebral volume within normal limits. No significant cerebral white matter disease or other focal parenchymal signal abnormality. No abnormal foci of restricted diffusion to suggest acute or subacute ischemia. Gray-white matter differentiation maintained. No areas of chronic cortical infarction. No acute or chronic intracranial blood products. No mass lesion, midline shift, or mass effect. No hydrocephalus. Cavum et septum pellucidum noted. No extra-axial fluid collection. Incidental note made of a tiny 3.5 mm parahippocampal cyst at the left hip campus (series 20, image 15), likely incidental in nature and of doubtful significance. No other intrinsic temporal lobe abnormality. Vascular: Major intracranial vascular flow voids are maintained. Skull and upper cervical spine: Craniocervical junction within normal limits. Bone marrow signal intensity normal. No scalp soft tissue abnormality. Sinuses/Orbits: Globes orbital soft tissues within normal limits. Paranasal sinuses are largely clear. No significant mastoid effusion. Other: None. IMPRESSION: Normal brain MRI.  No acute intracranial abnormality identified. Electronically Signed   By: Jeannine Boga M.D.   On: 12/15/2021 22:57   CT HEAD CODE STROKE WO CONTRAST  Result Date: 12/15/2021 CLINICAL DATA:  Code  stroke. Provided history: Neuro deficit, acute, stroke suspected. Additional history provided: Aphasia. EXAM: CT HEAD WITHOUT CONTRAST TECHNIQUE: Contiguous axial images were obtained from the base of the skull through the vertex without intravenous contrast. RADIATION DOSE REDUCTION: This exam was performed according to the departmental dose-optimization program which includes automated exposure control, adjustment of the mA and/or kV according to patient size and/or use of iterative reconstruction technique. COMPARISON:  Prior head CT examinations 12/02/2020 and earlier. Brain MRI 02/18/2013. FINDINGS: Brain: Cerebral volume is normal.  Cavum septum pellucidum and cavum vergae. There is no acute intracranial hemorrhage. No demarcated cortical infarct. No extra-axial fluid collection. No evidence of an intracranial mass. No midline shift. Vascular: No hyperdense vessel. Atherosclerotic calcifications. Skull: No fracture or aggressive osseous lesion. Sinuses/Orbits: No mass or acute finding within the imaged orbits. Minimal mucosal thickening scattered within the bilateral ethmoid sinuses. ASPECTS (Alberta Stroke Program Early CT Score) - Ganglionic level infarction (caudate, lentiform nuclei, internal capsule, insula, M1-M3 cortex): 7 - Supraganglionic infarction (M4-M6 cortex): 3 Total score (0-10 with 10 being normal): 10 These results were communicated to Dr. Otelia Limes at 6:04 pmon 6/27/2023by text page via the [facility] messaging system. IMPRESSION: No evidence of acute intracranial abnormality. ASPECTS is 10. Electronically Signed   By: Jackey Loge D.O.   On: 12/15/2021 18:04       Kathlen Mody M.D. [facility] 12/17/2021, 5:02 PM  Available via Epic secure chat 7am-7pm After 7 pm, please refer to night coverage provider listed on amion.

## 2021-12-17 NOTE — Progress Notes (Signed)
This RN was notified by pt's RN Fannie Knee) that pt c/o of missing her Vergie Living ear ring, pt said the ear ring was removed from her ear when she went for a test while she was still in the ED yesterday, she said she was told that they put the ear ring in her purse, but she could not find it in the purse. I tried to determine when and where or what can of test, pt said she cannot remember what test exactly, I told her that I will make a note and reach out to my supervisor to follow up with her complain. The [facility] Onalee Hua was notified who suggested Engineer, mining to come speak with pt, same called who said will search for it at the lost and found before coming to see pt, Security Staff later came to speak with pt about 2230. [place]-Asidi, Oluwatoyin Banales Efe

## 2021-12-17 NOTE — Progress Notes (Signed)
Pt advised this nurse early during the shift beginning 6/28 that she is missing a diamond stud earring. Pt states that she went for the first scan of this admission, and a female technician ripped the earring from her ear and stated he dropped it into her purse. Assisted pt to perform a thorough search of her belongings, which failed to produce the earring. Pt became very emotional, as the earring set was a gift from her late mother, who has been deceased for 10 years. Charge nurse Philomena was notified, and subsequently notified the [facility] and security. Security came to 3W and spoke with pt about the situation

## 2021-12-18 ENCOUNTER — Inpatient Hospital Stay (HOSPITAL_COMMUNITY): Payer: Medicaid Other

## 2021-12-18 DIAGNOSIS — I10 Essential (primary) hypertension: Secondary | ICD-10-CM | POA: Diagnosis not present

## 2021-12-18 DIAGNOSIS — M5126 Other intervertebral disc displacement, lumbar region: Secondary | ICD-10-CM | POA: Diagnosis not present

## 2021-12-18 DIAGNOSIS — G894 Chronic pain syndrome: Secondary | ICD-10-CM | POA: Diagnosis not present

## 2021-12-18 DIAGNOSIS — M5127 Other intervertebral disc displacement, lumbosacral region: Secondary | ICD-10-CM | POA: Diagnosis not present

## 2021-12-18 DIAGNOSIS — M48061 Spinal stenosis, lumbar region without neurogenic claudication: Secondary | ICD-10-CM | POA: Diagnosis not present

## 2021-12-18 DIAGNOSIS — R569 Unspecified convulsions: Secondary | ICD-10-CM | POA: Diagnosis not present

## 2021-12-18 DIAGNOSIS — M5136 Other intervertebral disc degeneration, lumbar region: Secondary | ICD-10-CM | POA: Diagnosis not present

## 2021-12-18 DIAGNOSIS — G43909 Migraine, unspecified, not intractable, without status migrainosus: Secondary | ICD-10-CM | POA: Diagnosis not present

## 2021-12-18 LAB — BASIC METABOLIC PANEL
Anion gap: 7 (ref 5–15)
BUN: 11 mg/dL (ref 6–20)
CO2: 22 mmol/L (ref 22–32)
Calcium: 8.9 mg/dL (ref 8.9–10.3)
Chloride: 112 mmol/L — ABNORMAL HIGH (ref 98–111)
Creatinine, Ser: 1.24 mg/dL — ABNORMAL HIGH (ref 0.44–1.00)
GFR, Estimated: 50 mL/min — ABNORMAL LOW (ref >60)
Glucose, Bld: 88 mg/dL (ref 70–99)
Potassium: 4.2 mmol/L (ref 3.5–5.1)
Sodium: 141 mmol/L (ref 135–145)

## 2021-12-18 LAB — GLUCOSE, CAPILLARY: Glucose-Capillary: 74 mg/dL (ref 70–99)

## 2021-12-18 MED ORDER — PROMETHAZINE HCL 25 MG PO TABS
25.0000 mg | ORAL_TABLET | Freq: Once | ORAL | Status: AC
Start: 2021-12-18 — End: 2021-12-18
  Administered 2021-12-18: 25 mg via ORAL
  Filled 2021-12-18: qty 1

## 2021-12-18 MED ORDER — FLEET ENEMA 7-19 GM/118ML RE ENEM
1.0000 | ENEMA | Freq: Every day | RECTAL | Status: DC | PRN
Start: 2021-12-18 — End: 2021-12-19

## 2021-12-18 MED ORDER — MAGNESIUM HYDROXIDE 400 MG/5ML PO SUSP
30.0000 mL | Freq: Every day | ORAL | Status: DC | PRN
  Administered 2021-12-18: 30 mL via ORAL
  Filled 2021-12-18: qty 30

## 2021-12-18 NOTE — TOC Progression Note (Signed)
Transition of Care [facility]) - Progression Note    Patient Details  Name: JAX ABDELRAHMAN MRN: [id number] Date of Birth: 1961-03-09  Transition of Care [facility]) CM/SW Contact  Bess Kinds, RN Phone Number: [phone] 12/18/2021, 5:25 PM  Clinical Narrative:     Received response from [place]. Referral accepted for [facility] PT with delayed start of care for Wednesday 12/23/2021. Spoke with patient on mobile phone to advise of accepting [facility] agency and SOC.   Expected Discharge Plan: Home/Self Care Barriers to Discharge: Continued Medical Work up  Expected Discharge Plan and Services Expected Discharge Plan: Home/Self Care       Living arrangements for the past 2 months: Single Family Home                           HH Arranged: PT HH Agency: [facility] Date [facility] Agency Contacted: 12/18/21 Time HH Agency Contacted: 1725 Representative spoke with at [facility] Agency: Kandee Keen   Social Determinants of Health (SDOH) Interventions    Readmission Risk Interventions     No data to display

## 2021-12-18 NOTE — Progress Notes (Signed)
Patient back in room from MRI.

## 2021-12-18 NOTE — Progress Notes (Signed)
[facility]                                                                               Kirsten Walsh, is a 61 y.o. female, DOB - 1960-09-23, [id number] Admit date - 12/15/2021    Outpatient Primary MD for the patient is Fleet Contras, MD  LOS - 1  days    Brief summary     Kirsten Walsh is a pleasant 61 y.o. female with medical history significant for chronic migraine, chronic back pain, hypertension, and mild renal insufficiency, now presenting to the emergency department after an episode of generalized seizure-like activity.  Patient reports that she woke with a migraine, similar to her prior migraines which include right-sided weakness.  Head CT negative for acute intracranial abnormality.  MRI brain was normal. neurology evaluated the patient, and hospitalists asked to admit.    Assessment & Plan    Assessment and Plan:  Seizure like episode:  No seizures anymore.  Prolonged EEG discussed by the neurologist.  Continue with topamax to 200 mg BID.  MRI c spine shows mild foraminal narrowing on the left.  Recommend outpatient follow up with Neurology next month .     Headache/ Migraine:  - Resume home meds.    Lower back pain:  X rays are negative for acute pathology.  MRI of the LS spine ordered due to persistent low back pain, despite multiple pain meds.  When pain is well controlled, plan for discharge . Therapy evaluations are pending.   Chronic pain syndrome:  Resume home meds.    Hypertension:  BP parameters are optimal.    Stage 2 CKD:  Creatinine at baseline.   Hypokalemia:  Replaced. Repeat levels wnl.    Dizziness earlier today Get orthostatic vital signs.    Constipation:  Added senna,MOM, miralax and added fleet enema.     Code Status: full code.  DVT Prophylaxis:  enoxaparin (LOVENOX) injection 40 mg Start: 12/16/21 1000   Level of Care: Level of care: Med-Surg Family Communication: none at bedside.    Disposition Plan:     Remains inpatient appropriate: IV pain meds   Procedures:  MRI brain MRI  cervical spine.   Consultants:   Neurology.   Antimicrobials:   Anti-infectives (From admission, onward)    None        Medications  Scheduled Meds:  cloNIDine  0.1 mg Oral BID   enoxaparin (LOVENOX) injection  40 mg Subcutaneous Q24H   gabapentin  300 mg Oral BID   pantoprazole  40 mg Oral Daily   polyethylene glycol  17 g Oral BID   senna-docusate  2 tablet Oral BID   sodium chloride flush  3 mL Intravenous Q12H   tiZANidine  4 mg Oral QHS   topiramate  200 mg Oral BID   Continuous Infusions:  methocarbamol (ROBAXIN) IV 500 mg (12/16/21 1729)   PRN Meds:.acetaminophen **OR** acetaminophen, diclofenac Sodium, fentaNYL (SUBLIMAZE) injection, lidocaine, magnesium hydroxide, methocarbamol (ROBAXIN) IV, ondansetron **OR** ondansetron (ZOFRAN) IV, oxyCODONE, sodium phosphate, SUMAtriptan, traZODone    Subjective:   Kirsten Walsh was seen and examined today.   Persistent back pain, no BM for 8 days  and pt reports dizziness and feel faint. Objective:   Vitals:   12/18/21 0416 12/18/21 0838 12/18/21 1105 12/18/21 1306  BP: 109/70 131/82 95/62 128/77  Pulse: (!) 55 61 72 61  Resp: 15 18 18    Temp: 98.4 F (36.9 C) 98.8 F (37.1 C) 98.2 F (36.8 C) 99.6 F (37.6 C)  TempSrc: Oral Oral  Oral  SpO2: 98% 98%  100%  Weight:       No intake or output data in the 24 hours ending 12/18/21 1504  Filed Weights   12/15/21 1746  Weight: 95 kg     Exam General exam: Appears calm and comfortable  Respiratory system: Clear to auscultation. Respiratory effort normal. Cardiovascular system: S1 & S2 heard, RRR. No JVD, murmurs, rubs, gallops or clicks. No pedal edema. Gastrointestinal system: Abdomen is nondistended, soft and nontender. No organomegaly or masses felt. Normal bowel sounds heard. Central nervous system: Alert and oriented. No focal neurological  deficits. Extremities: Symmetric 5 x 5 power. Skin: No rashes, lesions or ulcers Psychiatry: Judgement and insight appear normal. Mood & affect appropriate.     Data Reviewed:  I have personally reviewed following labs and imaging studies   CBC Lab Results  Component Value Date   WBC 3.5 (L) 12/16/2021   RBC 3.72 (L) 12/16/2021   HGB 11.5 (L) 12/16/2021   HCT 34.5 (L) 12/16/2021   MCV 92.7 12/16/2021   MCH 30.9 12/16/2021   PLT 190 12/16/2021   MCHC 33.3 12/16/2021   RDW 14.0 12/16/2021   LYMPHSABS 1.6 12/15/2021   MONOABS 0.4 12/15/2021   EOSABS 0.1 12/15/2021   BASOSABS 0.0 12/15/2021     Last metabolic panel Lab Results  Component Value Date   NA 141 12/18/2021   K 4.2 12/18/2021   CL 112 (H) 12/18/2021   CO2 22 12/18/2021   BUN 11 12/18/2021   CREATININE 1.24 (H) 12/18/2021   GLUCOSE 88 12/18/2021   GFRNONAA 50 (L) 12/18/2021   GFRAA 72 08/14/2020   CALCIUM 8.9 12/18/2021   PHOS 1.8 (L) 07/06/2017   PROT 6.2 (L) 12/15/2021   ALBUMIN 3.4 (L) 12/15/2021   BILITOT 0.6 12/15/2021   ALKPHOS 68 12/15/2021   AST 22 12/15/2021   ALT 17 12/15/2021   ANIONGAP 7 12/18/2021    CBG (last 3)  Recent Labs    12/15/21 1741  GLUCAP 120*       Coagulation Profile: Recent Labs  Lab 12/15/21 1850  INR 1.1      Radiology Studies: DG Lumbar Spine 2-3 Views  Result Date: 12/16/2021 CLINICAL DATA:  Back pain. EXAM: LUMBAR SPINE - 2-3 VIEW COMPARISON:  Lumbar spine radiographs 05/14/2020. FINDINGS: There are 5 lumbar type vertebral bodies. The alignment is stable with a minimal convex right scoliosis which may be positional. No evidence of acute fracture or pars defect. There is mild multilevel disc space narrowing and intervertebral spurring, greatest at L2-3, L3-4 and L5-S1. Intrauterine device and cholecystectomy clips are noted. IMPRESSION: Mild lumbar spondylosis.  No acute osseous findings. Electronically Signed   By: 05/16/2020 M.D.   On: 12/16/2021  16:37       12/18/2021 M.D. [facility] 12/18/2021, 3:04 PM  Available via Epic secure chat 7am-7pm After 7 pm, please refer to night coverage provider listed on amion.

## 2021-12-18 NOTE — Progress Notes (Signed)
Physical Therapy Treatment Patient Details Name: Kirsten Walsh MRN: [id number] DOB: April 19, 1961 Today's Date: 12/18/2021   History of Present Illness Pt is a 61 y/o female admitted secondary to seizure like activity. MRI negative. PMH includes HTN and migraines.    PT Comments    Patient received resting in bed, she is agreeable to PT session. Reports 10/10 pain ( right hip and migraine) She is mod independent with bed mobility but is slow due to pain. Transfers with min guard and ambulated with min guard and chair to follow for last part due to patient pain and unsteady gait. She ambulated with RW this session. She is limited with mobility due to pain and feeling bad. Patient will continue to benefit from skilled PT while here to improve functional mobility, independence, and safety.    Recommendations for follow up therapy are one component of a multi-disciplinary discharge planning process, led by the attending physician.  Recommendations may be updated based on patient status, additional functional criteria and insurance authorization.  Follow Up Recommendations  Home health PT     Assistance Recommended at Discharge Intermittent Supervision/Assistance  Patient can return home with the following A little help with walking and/or transfers;A little help with bathing/dressing/bathroom;Help with stairs or ramp for entrance;Assist for transportation;Assistance with cooking/housework   Equipment Recommendations  Rolling walker (2 wheels)    Recommendations for Other Services       Precautions / Restrictions Precautions Precautions: Fall Restrictions Weight Bearing Restrictions: No     Mobility  Bed Mobility Overal bed mobility: Modified Independent Bed Mobility: Supine to Sit, Sit to Supine     Supine to sit: Modified independent (Device/Increase time) Sit to supine: Modified independent (Device/Increase time)   General bed mobility comments: increased time due to  pain    Transfers Overall transfer level: Modified independent Equipment used: Rolling walker (2 wheels) Transfers: Sit to/from Stand Sit to Stand: Min guard           General transfer comment: Min guard for safety    Ambulation/Gait Ambulation/Gait assistance: Min guard Gait Distance (Feet): 20 Feet Assistive device: Rolling walker (2 wheels) Gait Pattern/deviations: Step-through pattern, Decreased step length - right, Decreased step length - left Gait velocity: decr     General Gait Details: Due to pain patient ambulating very slow, stopping at times, feeling nauseated, unsteady at times. ( i felt like she might fall or pass out at times, I ended up grabbing BSC to follow behand her due to my fear of her falling when going back to the bed.)   Stairs             Wheelchair Mobility    Modified Rankin (Stroke Patients Only)       Balance Overall balance assessment: Needs assistance Sitting-balance support: Feet supported Sitting balance-Leahy Scale: Good     Standing balance support: Bilateral upper extremity supported, During functional activity Standing balance-Leahy Scale: Fair                              Cognition Arousal/Alertness: Awake/alert Behavior During Therapy: WFL for tasks assessed/performed Overall Cognitive Status: No family/caregiver present to determine baseline cognitive functioning                                          Exercises      General  Comments        Pertinent Vitals/Pain Pain Assessment Pain Assessment: 0-10 Pain Score: 10-Worst pain ever Pain Location: R hip and headache Pain Descriptors / Indicators: Discomfort, Aching Pain Intervention(s): Monitored during session, Limited activity within patient's tolerance    Home Living                          Prior Function            PT Goals (current goals can now be found in the care plan section) Acute Rehab PT  Goals Patient Stated Goal: to decrease pain and go home PT Goal Formulation: With patient Time For Goal Achievement: 12/31/21 Potential to Achieve Goals: Fair Progress towards PT goals: Progressing toward goals    Frequency    Min 3X/week      PT Plan Equipment recommendations need to be updated;Discharge plan needs to be updated    Co-evaluation              AM-PAC PT "6 Clicks" Mobility   Outcome Measure  Help needed turning from your back to your side while in a flat bed without using bedrails?: None Help needed moving from lying on your back to sitting on the side of a flat bed without using bedrails?: None Help needed moving to and from a bed to a chair (including a wheelchair)?: A Little Help needed standing up from a chair using your arms (e.g., wheelchair or bedside chair)?: A Little Help needed to walk in hospital room?: A Little Help needed climbing 3-5 steps with a railing? : A Lot 6 Click Score: 19    End of Session Equipment Utilized During Treatment: Gait belt Activity Tolerance: Patient limited by pain Patient left: in bed;with call bell/phone within reach;with bed alarm set Nurse Communication: Mobility status PT Visit Diagnosis: Other abnormalities of gait and mobility (R26.89);Pain;Difficulty in walking, not elsewhere classified (R26.2) Pain - Right/Left: Right Pain - part of body: Hip (head)     Time: 1400-1415 PT Time Calculation (min) (ACUTE ONLY): 15 min  Charges:  $Gait Training: 8-22 mins                     Jett Kulzer, PT, GCS 12/18/21,2:30 PM

## 2021-12-18 NOTE — TOC Progression Note (Signed)
Transition of Care [facility]) - Progression Note    Patient Details  Name: Kirsten Walsh MRN: [id number] Date of Birth: 1960/11/16  Transition of Care [facility]) CM/SW Contact  Bess Kinds, RN Phone Number: [phone] 12/18/2021, 4:43 PM  Clinical Narrative:     Spoke with patient on her mobile phone to discuss post acute transition. Discussed recommendations for [facility] PT. Advised of unable to find accepting agency. Discussed outpatient rehab - patient stated that she would have transportation. Discussed [address] Location - patient agreeable. Patient stated that she has someone at home who can stay with her after discharge. She also has transportation home. TOC following for transition needs.   Referrals declined:  [facility]  Referrals pending response:  [facility]   Expected Discharge Plan: Home/Self Care Barriers to Discharge: Continued Medical Work up  Expected Discharge Plan and Services Expected Discharge Plan: Home/Self Care       Living arrangements for the past 2 months: Single Family Home                                       Social Determinants of Health (SDOH) Interventions    Readmission Risk Interventions     No data to display

## 2021-12-19 DIAGNOSIS — R569 Unspecified convulsions: Secondary | ICD-10-CM | POA: Diagnosis not present

## 2021-12-19 DIAGNOSIS — G894 Chronic pain syndrome: Secondary | ICD-10-CM | POA: Diagnosis not present

## 2021-12-19 DIAGNOSIS — Z419 Encounter for procedure for purposes other than remedying health state, unspecified: Secondary | ICD-10-CM | POA: Diagnosis not present

## 2021-12-19 DIAGNOSIS — I10 Essential (primary) hypertension: Secondary | ICD-10-CM | POA: Diagnosis not present

## 2021-12-19 MED ORDER — METHYLPREDNISOLONE 4 MG PO TBPK: ORAL_TABLET | ORAL | 0 refills | Status: DC

## 2021-12-19 MED ORDER — TOPIRAMATE 200 MG PO TABS: 200.0000 mg | ORAL_TABLET | Freq: Two times a day (BID) | ORAL | 1 refills | Status: DC

## 2021-12-19 MED ORDER — TIZANIDINE HCL 4 MG PO CAPS: 4.0000 mg | ORAL_CAPSULE | Freq: Every evening | ORAL | 0 refills | Status: AC | PRN

## 2021-12-19 MED ORDER — SENNOSIDES-DOCUSATE SODIUM 8.6-50 MG PO TABS: 2.0000 | ORAL_TABLET | Freq: Two times a day (BID) | ORAL | Status: AC

## 2021-12-19 NOTE — Discharge Summary (Incomplete)
Physician Discharge Summary   Patient: Kirsten Walsh MRN: [id number] DOB: 18-Dec-1960  Admit date:     12/15/2021  Discharge date: 12/19/21  Discharge Physician: Kathlen Mody   PCP: [name] Mattocks, DO   Recommendations at discharge:  Please follow up with neuro surgery   Discharge Diagnoses: Principal Problem:   Seizure-like activity (HCC) Active Problems:   Nonintractable chronic migraine   HYPERTENSION, BENIGN SYSTEMIC   Chronic pain   Seizures [facility])    Hospital Course: No notes on file  Assessment and Plan: No notes have been filed under this hospital service. Service: Hospitalist     {Tip this will not be part of the note when signed Body mass index is 34.85 kg/m. , ,  (Optional):26781}  {(NOTE) Pain control PDMP Statment (Optional):26782} Consultants: neuro surgery  Procedures performed: MRI LS spine  Disposition: Home Diet recommendation:  Discharge Diet Orders (From admission, onward)     Start     Ordered   12/19/21 0000  Diet - low sodium heart healthy        12/19/21 1358           Regular diet DISCHARGE MEDICATION: Allergies as of 12/19/2021       Reactions   Apple Anaphylaxis, Swelling   Aspirin Anaphylaxis, Swelling   Daucus Carota Anaphylaxis, Swelling   Haloperidol And Related Anaphylaxis, Swelling, Other (See Comments)   Lock jaw and slurred speech        Medication List     STOP taking these medications    potassium chloride SA 20 MEQ tablet Commonly known as: KLOR-CON M       TAKE these medications    Butalbital-APAP-Caffeine 50-325-40 MG capsule Take 1 capsule by mouth every 6 (six) hours as needed for headache (migraine).   cetirizine 10 MG tablet Commonly known as: ZYRTEC Take 10 mg by mouth daily as needed for allergies.   cloNIDine 0.1 MG tablet Commonly known as: CATAPRES TAKE 1 TABLET(0.1 MG) BY MOUTH TWICE DAILY What changed: See the new instructions.   Diclofenac Sodium 3 % Gel Apply 1 application  topically 2 (two) times daily as needed (pain).   EPINEPHrine 0.3 mg/0.3 mL Soaj injection Commonly known as: EPI-PEN Inject 0.3 mg into the muscle as needed for anaphylaxis.   fluticasone 50 MCG/ACT nasal spray Commonly known as: FLONASE Place 1 spray into both nostrils daily as needed for allergies or rhinitis.   gabapentin 300 MG capsule Commonly known as: NEURONTIN Take 300 mg by mouth 2 (two) times daily.   hydrochlorothiazide 25 MG tablet Commonly known as: HYDRODIURIL TAKE 1 TABLET(25 MG) BY MOUTH DAILY What changed: See the new instructions.   ibuprofen 800 MG tablet Commonly known as: ADVIL Take 800 mg by mouth every 8 (eight) hours as needed for moderate pain.   lidocaine 5 % ointment Commonly known as: XYLOCAINE Apply 1 Application topically daily as needed for moderate pain.   methylPREDNISolone 4 MG Tbpk tablet Commonly known as: MEDROL DOSEPAK Use as directed.   ondansetron 4 MG tablet Commonly known as: ZOFRAN Take 4 mg by mouth 3 (three) times daily as needed for nausea or vomiting.   oxyCODONE 15 MG immediate release tablet Commonly known as: ROXICODONE Take 15 mg by mouth every 6 (six) hours.   pantoprazole 40 MG tablet Commonly known as: PROTONIX Take 40 mg by mouth in the morning.   Phendimetrazine Tartrate 105 MG Cp24 Take 105 mg by mouth daily as needed (weight loss). Last dose approx 12/22/20  per pt   polyethylene glycol 17 g packet Commonly known as: MIRALAX / GLYCOLAX Take 17 g by mouth daily as needed for moderate constipation.   potassium chloride 10 MEQ tablet Commonly known as: KLOR-CON Take 2 tablets (20 mEq total) by mouth daily. What changed: how much to take   promethazine 25 MG tablet Commonly known as: PHENERGAN Take 1 tablet (25 mg total) by mouth every 6 (six) hours as needed for nausea or vomiting. What changed: Another medication with the same name was removed. Continue taking this medication, and follow the directions you  see here.   senna-docusate 8.6-50 MG tablet Commonly known as: Senokot-S Take 2 tablets by mouth 2 (two) times daily.   SUMAtriptan 100 MG tablet Commonly known as: IMITREX Take 100 mg by mouth every 2 (two) hours as needed for migraine. May repeat in 2 hours if headache persists or recurs.   tiZANidine 4 MG capsule Commonly known as: ZANAFLEX Take 1 capsule (4 mg total) by mouth at bedtime as needed for muscle spasms. What changed:  when to take this reasons to take this   topiramate 200 MG tablet Commonly known as: TOPAMAX Take 1 tablet (200 mg total) by mouth 2 (two) times daily. What changed:  medication strength See the new instructions.   traZODone 100 MG tablet Commonly known as: DESYREL Take 100 mg by mouth at bedtime as needed for sleep.   Vitamin D (Ergocalciferol) 1.25 MG (50000 UNIT) Caps capsule Commonly known as: DRISDOL Take 50,000 Units by mouth every Monday.        Follow-up Information     [facility] Follow up.   Specialty: Home Health Services Why: the office will call to schedule home health physical therapy visits next week on Wednesday 7/5 Contact information: [address] [place] [address] [phone]         [name] Mattocks, DO. Schedule an appointment as soon as possible for a visit in 1 week(s).   Specialty: Family Medicine Contact information: [address] [place] [address] [phone]         [facility]. Schedule an appointment as soon as possible for a visit in 2 week(s).   Specialty: Neurosurgery Contact information: [address] [place] [address] [phone]                Discharge Exam: Ceasar Mons Weights   12/15/21 1746  Weight: 95 kg   General exam: Appears calm and comfortable  Respiratory system: Clear to auscultation. Respiratory effort normal. Cardiovascular system: S1 & S2 heard, RRR. No JVD, murmurs, rubs,  gallops or clicks. No pedal edema. Gastrointestinal system: Abdomen is nondistended, soft and nontender. No organomegaly or masses felt. Normal bowel sounds heard. Central nervous system: Alert and oriented. No focal neurological deficits. Extremities: Symmetric 5 x 5 power. Skin: No rashes, lesions or ulcers Psychiatry: Judgement and insight appear normal. Mood & affect appropriate.    Condition at discharge: fair  The results of significant diagnostics from this hospitalization (including imaging, microbiology, ancillary and laboratory) are listed below for reference.   Imaging Studies: MR LUMBAR SPINE WO CONTRAST  Result Date: 12/18/2021 CLINICAL DATA:  Initial evaluation for low back pain. EXAM: MRI LUMBAR SPINE WITHOUT CONTRAST TECHNIQUE: Multiplanar, multisequence MR imaging of the lumbar spine was performed. No intravenous contrast was administered. COMPARISON:  Radiograph from 12/16/2021. FINDINGS: Segmentation: Standard. Lowest well-formed disc space labeled the L5-S1 level. Alignment: Physiologic with  preservation of the normal lumbar lordosis. No listhesis. Vertebrae: Vertebral body height maintained without acute or chronic fracture. Bone marrow signal intensity within normal limits. No discrete or worrisome osseous lesions or abnormal marrow edema. Conus medullaris and cauda equina: Conus extends to the L1-2 level. Conus and cauda equina appear normal. Paraspinal and other soft tissues: Paraspinous soft tissues within normal limits. Simple T2 hyperintense cyst partially visualize within the right kidney, benign in appearance, no follow-up imaging recommended. Disc levels: L1-2:  Unremarkable. L2-3: Degenerative disc space narrowing with diffuse disc bulge and disc desiccation. Disc bulge slightly asymmetric to the left with reactive endplate spurring. Mild facet and ligament flavum hypertrophy with associated trace joint effusions. No significant spinal stenosis. Foramina remain patent.  L3-4: Degenerative intervertebral disc space narrowing with diffuse disc bulge and disc desiccation. Mild reactive endplate spurring. Superimposed right subarticular disc protrusion with annular fissure (series 5, image 22). Mild facet hypertrophy. Resultant mild narrowing of the right lateral recess. Central canal remains patent. Mild left L3 foraminal stenosis. Right neural foramen remains patent. L4-5: Disc desiccation with mild disc bulge. Shallow left extraforaminal disc protrusion with annular fissure closely approximates the exiting left L4 nerve root (series 5, image 29). Mild to moderate facet and ligament flavum hypertrophy with associated trace joint effusions. Borderline mild narrowing of the lateral recesses bilaterally. Central canal remains patent. Mild left L4 foraminal stenosis. Right neural foramen patent. L5-S1: Disc desiccation with intervertebral disc space narrowing. Superimposed central disc protrusion with slight superior migration. Protruding disc closely approximates and/or contacts both of the descending S1 nerve roots as they course through the lateral recesses, slightly greater on the left. Mild to moderate facet hypertrophy. Mild narrowing of the lateral recesses bilaterally. Central canal remains patent. No significant foraminal stenosis. IMPRESSION: 1. Central disc protrusion with slight superior migration at L5-S1, closely approximating and potentially affecting either of the descending S1 nerve roots as they course through the lateral recesses, slightly greater on the left. 2. Shallow left extraforaminal disc protrusion at L4-5, closely approximating and potentially irritating the exiting left L4 nerve root. 3. Right subarticular disc protrusion at L3-4 with resultant mild right lateral recess stenosis. Electronically Signed   By: Rise Mu M.D.   On: 12/18/2021 21:27   DG Lumbar Spine 2-3 Views  Result Date: 12/16/2021 CLINICAL DATA:  Back pain. EXAM: LUMBAR SPINE -  2-3 VIEW COMPARISON:  Lumbar spine radiographs 05/14/2020. FINDINGS: There are 5 lumbar type vertebral bodies. The alignment is stable with a minimal convex right scoliosis which may be positional. No evidence of acute fracture or pars defect. There is mild multilevel disc space narrowing and intervertebral spurring, greatest at L2-3, L3-4 and L5-S1. Intrauterine device and cholecystectomy clips are noted. IMPRESSION: Mild lumbar spondylosis.  No acute osseous findings. Electronically Signed   By: Carey Bullocks M.D.   On: 12/16/2021 16:37   MR CERVICAL SPINE WO CONTRAST  Result Date: 12/16/2021 CLINICAL DATA:  Cervical radiculopathy. EXAM: MRI CERVICAL SPINE WITHOUT CONTRAST TECHNIQUE: Multiplanar, multisequence MR imaging of the cervical spine was performed. No intravenous contrast was administered. COMPARISON:  MRI cervical spine 01/13/2010. CT cervical spine 05/14/2020 FINDINGS: Alignment: Normal Vertebrae: Normal bone marrow.  Negative for fracture or mass Cord: Cord evaluation limited by motion. Allowing for motion, no cord signal abnormality identified. Posterior Fossa, vertebral arteries, paraspinal tissues: Negative Disc levels: C2-3: Negative C3-4: Mild disc bulging.  Negative for stenosis C4-5: Negative C5-6: Mild disc degeneration with mild disc space narrowing and disc desiccation. Mild to  moderate left foraminal narrowing due to uncinate spurring. Right foramen patent. Central canal patent C6-7: Mild disc degeneration. Diffuse uncinate spurring. Mild left foraminal narrowing due to spurring. Central canal patent C7-T1: Negative IMPRESSION: Disc degeneration and spurring on the left at C5-6 and C6-7 causing left foraminal narrowing. Electronically Signed   By: Marlan Palau M.D.   On: 12/16/2021 13:36   EEG adult  Result Date: 12/16/2021 Charlsie Quest, MD     12/16/2021  8:27 AM Patient Name: GAYATHRI FUTRELL MRN: [id number] Epilepsy Attending: Charlsie Quest Referring  Physician/Provider: Caryl Pina, MD Date: 12/16/2021 Duration: 21.20 mins Patient history: 61 year old female with seizure-like activity.  EEG to evaluate for seizure. Level of alertness: Awake, asleep AEDs during EEG study: TPM Technical aspects: This EEG study was done with scalp electrodes positioned according to the 10-20 International system of electrode placement. Electrical activity was acquired at a sampling rate of 500Hz  and reviewed with a high frequency filter of 70Hz  and a low frequency filter of 1Hz . EEG data were recorded continuously and digitally stored. Description: The posterior dominant rhythm consists of 8 Hz activity of moderate voltage (25-35 uV) seen predominantly in posterior head regions, symmetric and reactive to eye opening and eye closing. leep was characterized by vertex waves, sleep spindles (12 to 14 Hz), maximal frontocentral region. Hyperventilation and photic stimulation were not performed.   IMPRESSION: This study is within normal limits. No seizures or epileptiform discharges were seen throughout the recording.   MR BRAIN WO CONTRAST  Result Date: 12/15/2021 CLINICAL DATA:  Initial evaluation for acute neuro deficit, stroke suspected. EXAM: MRI HEAD WITHOUT CONTRAST TECHNIQUE: Multiplanar, multiecho pulse sequences of the brain and surrounding structures were obtained without intravenous contrast. COMPARISON:  Prior CT from earlier the same day. FINDINGS: Brain: Cerebral volume within normal limits. No significant cerebral white matter disease or other focal parenchymal signal abnormality. No abnormal foci of restricted diffusion to suggest acute or subacute ischemia. Gray-white matter differentiation maintained. No areas of chronic cortical infarction. No acute or chronic intracranial blood products. No mass lesion, midline shift, or mass effect. No hydrocephalus. Cavum et septum pellucidum noted. No extra-axial fluid collection. Incidental note made of a  tiny 3.5 mm parahippocampal cyst at the left hip campus (series 20, image 15), likely incidental in nature and of doubtful significance. No other intrinsic temporal lobe abnormality. Vascular: Major intracranial vascular flow voids are maintained. Skull and upper cervical spine: Craniocervical junction within normal limits. Bone marrow signal intensity normal. No scalp soft tissue abnormality. Sinuses/Orbits: Globes orbital soft tissues within normal limits. Paranasal sinuses are largely clear. No significant mastoid effusion. Other: None. IMPRESSION: Normal brain MRI.  No acute intracranial abnormality identified. Electronically Signed   By: M.D.   On: 12/15/2021 22:57   CT HEAD CODE STROKE WO CONTRAST  Result Date: 12/15/2021 CLINICAL DATA:  Code stroke. Provided history: Neuro deficit, acute, stroke suspected. Additional history provided: Aphasia. EXAM: CT HEAD WITHOUT CONTRAST TECHNIQUE: Contiguous axial images were obtained from the base of the skull through the vertex without intravenous contrast. RADIATION DOSE REDUCTION: This exam was performed according to the departmental dose-optimization program which includes automated exposure control, adjustment of the mA and/or kV according to patient size and/or use of iterative reconstruction technique. COMPARISON:  Prior head CT examinations 12/02/2020 and earlier. Brain MRI 02/18/2013. FINDINGS: Brain: Cerebral volume is normal. Cavum septum pellucidum and cavum vergae. There is no acute intracranial hemorrhage. No demarcated cortical infarct. No  extra-axial fluid collection. No evidence of an intracranial mass. No midline shift. Vascular: No hyperdense vessel. Atherosclerotic calcifications. Skull: No fracture or aggressive osseous lesion. Sinuses/Orbits: No mass or acute finding within the imaged orbits. Minimal mucosal thickening scattered within the bilateral ethmoid sinuses. ASPECTS ([place] Stroke Program Early CT Score) -  Ganglionic level infarction (caudate, lentiform nuclei, internal capsule, insula, M1-M3 cortex): 7 - Supraganglionic infarction (M4-M6 cortex): 3 Total score (0-10 with 10 being normal): 10 These results were communicated to Dr. Otelia LimesLindzen at 6:04 pmon 6/27/2023by text page via the [facility] messaging system. IMPRESSION: No evidence of acute intracranial abnormality. ASPECTS is 10. Electronically Signed   By: Jackey LogeKyle  Golden D.O.   On: 12/15/2021 18:04    Microbiology: Results for orders placed or performed during the hospital encounter of 01/13/21  SARS CORONAVIRUS 2 (TAT 6-24 HRS) Nasopharyngeal Nasopharyngeal Swab     Status: None   Collection Time: 01/13/21 10:30 AM   Specimen: Nasopharyngeal Swab  Result Value Ref Range Status   SARS Coronavirus 2 NEGATIVE NEGATIVE Final    Comment: (NOTE) SARS-CoV-2 target nucleic acids are NOT DETECTED.  The SARS-CoV-2 RNA is generally detectable in upper and lower respiratory specimens during the acute phase of infection. Negative results do not preclude SARS-CoV-2 infection, do not rule out co-infections with other pathogens, and should not be used as the sole basis for treatment or other patient management decisions. Negative results must be combined with clinical observations, patient history, and epidemiological information. The expected result is Negative.  Fact Sheet for Patients: HairSlick.nohttps://www.fda.gov/media/138098/download  Fact Sheet for Healthcare Providers: quierodirigir.comhttps://www.fda.gov/media/138095/download  This test is not yet approved or cleared by the [place] FDA and  has been authorized for detection and/or diagnosis of SARS-CoV-2 by FDA under an Emergency Use Authorization (EUA). This EUA will remain  in effect (meaning this test can be used) for the duration of the COVID-19 declaration under Se ction 564(b)(1) of the Act, 21 U.S.C. section 360bbb-3(b)(1), unless the authorization is terminated or revoked sooner.  Performed at [facility], [address] [place], [place] [address]     Labs: CBC: Recent Labs  Lab 12/15/21 1850 12/15/21 1858 12/16/21 0445  WBC 4.9  --  3.5*  NEUTROABS 2.8  --   --   HGB 12.0 12.2 11.5*  HCT 37.8 36.0 34.5*  MCV 94.5  --  92.7  PLT 212  --  190   Basic Metabolic Panel: Recent Labs  Lab 12/15/21 1850 12/15/21 1858 12/16/21 0445 12/17/21 1813 12/18/21 1418  NA 141 141 141  --  141  K 3.4* 3.4* 3.4* 5.4* 4.2  CL 106 104 111  --  112*  CO2 27  --  24  --  22  GLUCOSE 96 92 119*  --  88  BUN 7 7 5*  --  11  CREATININE 1.22* 1.20* 0.98  --  1.24*  CALCIUM 8.9  --  8.7*  --  8.9  MG  --   --  1.9  --   --    Liver Function Tests: Recent Labs  Lab 12/15/21 1850  AST 22  ALT 17  ALKPHOS 68  BILITOT 0.6  PROT 6.2*  ALBUMIN 3.4*   CBG: Recent Labs  Lab 12/15/21 1741 12/18/21 1307  GLUCAP 120* 74    Discharge time spent: 39 minutes  Signed: Kathlen ModyVijaya Hani Campusano, MD [facility] 12/19/2021

## 2021-12-19 NOTE — Progress Notes (Signed)
Diamond earring found in patients room after patient was discharged. Patient has been notified and instructed to come retrieve diamond earring. Diamond earring placed in safe belongings at front desk with staff.

## 2021-12-24 NOTE — Discharge Summary (Signed)
Physician Discharge Summary   Patient: Kirsten Walsh MRN: [id number] DOB: [birth year]  Admit date:     12/15/2021  Discharge date: 12/19/2021  Discharge Physician: Hosie Poisson   PCP: Wells Guiles, DO   Recommendations at discharge:  Please follow up with pcp IN ONE WEEK.  Please follow up with neuro surgery as recommended in 2 weeks after the medrol dosepack.  Please follow up with Neurology for LTM EEG as outpatient.   Discharge Diagnoses: Principal Problem:   Seizure-like activity ([place]) Active Problems:   Nonintractable chronic migraine   HYPERTENSION, BENIGN SYSTEMIC   Chronic pain   Seizures [facility])   Hospital Course:  Kirsten Walsh is a pleasant 61 y.o. female with medical history significant for chronic migraine, chronic back pain, hypertension, and mild renal insufficiency, now presenting to the emergency department after an episode of generalized seizure-like activity.  Patient reports that she woke with a migraine, similar to her prior migraines which include right-sided weakness.  Head CT negative for acute intracranial abnormality.  MRI brain was normal. neurology evaluated the patient, and hospitalists asked to admit.  Assessment and Plan:  No seizures anymore.  Prolonged EEG discussed by the neurologist.  Continue with topamax to 200 mg BID.  MRI c spine shows mild foraminal narrowing on the left.  Recommend outpatient follow up with Neurology next month .        Headache/ Migraine:  - Resume home meds.      Lower back pain:  X rays are negative for acute pathology.  MRI of the LS spine ordered due to persistent low back pain, despite multiple pain meds.  Discussed the results of the MRI with the patient and Neurosurgery.  Neurosurgery recommended medrol dose pack and muscle relaxants on discharge, and outpatient follow up with their office in 2 weeks.  pain is well controlled, plan for discharge . Therapy evaluations  done.    Chronic pain  syndrome:  Resume home meds.      Hypertension:  BP parameters are optimal.      Stage 2 CKD:  Creatinine at baseline.    Hypokalemia:  Replaced. Repeat levels wnl.        Constipation:  Resolved.  Added senna,MOM, miralax and added fleet enema.           Consultants: neurosurgery- recommendations given over the phone.  Neurology.  Procedures performed: EEG  Disposition: Home Diet recommendation:  Discharge Diet Orders (From admission, onward)     Start     Ordered   12/19/21 0000  Diet - low sodium heart healthy        12/19/21 1358           Regular diet DISCHARGE MEDICATION: Allergies as of 12/19/2021       Reactions   Apple Anaphylaxis, Swelling   Aspirin Anaphylaxis, Swelling   Daucus Carota Anaphylaxis, Swelling   Haloperidol And Related Anaphylaxis, Swelling, Other (See Comments)   Lock jaw and slurred speech        Medication List     STOP taking these medications    potassium chloride SA 20 MEQ tablet Commonly known as: KLOR-CON M       TAKE these medications    Butalbital-APAP-Caffeine 50-325-40 MG capsule Take 1 capsule by mouth every 6 (six) hours as needed for headache (migraine).   cetirizine 10 MG tablet Commonly known as: ZYRTEC Take 10 mg by mouth daily as needed for allergies.   cloNIDine 0.1 MG tablet Commonly known  as: CATAPRES TAKE 1 TABLET(0.1 MG) BY MOUTH TWICE DAILY What changed: See the new instructions.   Diclofenac Sodium 3 % Gel Apply 1 application topically 2 (two) times daily as needed (pain).   EPINEPHrine 0.3 mg/0.3 mL Soaj injection Commonly known as: EPI-PEN Inject 0.3 mg into the muscle as needed for anaphylaxis.   fluticasone 50 MCG/ACT nasal spray Commonly known as: FLONASE Place 1 spray into both nostrils daily as needed for allergies or rhinitis.   gabapentin 300 MG capsule Commonly known as: NEURONTIN Take 300 mg by mouth 2 (two) times daily.   hydrochlorothiazide 25 MG  tablet Commonly known as: HYDRODIURIL TAKE 1 TABLET(25 MG) BY MOUTH DAILY What changed: See the new instructions.   ibuprofen 800 MG tablet Commonly known as: ADVIL Take 800 mg by mouth every 8 (eight) hours as needed for moderate pain.   lidocaine 5 % ointment Commonly known as: XYLOCAINE Apply 1 Application topically daily as needed for moderate pain.   methylPREDNISolone 4 MG Tbpk tablet Commonly known as: MEDROL DOSEPAK Use as directed.   ondansetron 4 MG tablet Commonly known as: ZOFRAN Take 4 mg by mouth 3 (three) times daily as needed for nausea or vomiting.   oxyCODONE 15 MG immediate release tablet Commonly known as: ROXICODONE Take 15 mg by mouth every 6 (six) hours.   pantoprazole 40 MG tablet Commonly known as: PROTONIX Take 40 mg by mouth in the morning.   Phendimetrazine Tartrate 105 MG Cp24 Take 105 mg by mouth daily as needed (weight loss). Last dose approx 12/22/20 per pt   polyethylene glycol 17 g packet Commonly known as: MIRALAX / GLYCOLAX Take 17 g by mouth daily as needed for moderate constipation.   potassium chloride 10 MEQ tablet Commonly known as: KLOR-CON Take 2 tablets (20 mEq total) by mouth daily. What changed: how Walsh to take   promethazine 25 MG tablet Commonly known as: PHENERGAN Take 1 tablet (25 mg total) by mouth every 6 (six) hours as needed for nausea or vomiting. What changed: Another medication with the same name was removed. Continue taking this medication, and follow the directions you see here.   senna-docusate 8.6-50 MG tablet Commonly known as: Senokot-S Take 2 tablets by mouth 2 (two) times daily.   SUMAtriptan 100 MG tablet Commonly known as: IMITREX Take 100 mg by mouth every 2 (two) hours as needed for migraine. May repeat in 2 hours if headache persists or recurs.   tiZANidine 4 MG capsule Commonly known as: ZANAFLEX Take 1 capsule (4 mg total) by mouth at bedtime as needed for muscle spasms. What changed:   when to take this reasons to take this   topiramate 200 MG tablet Commonly known as: TOPAMAX Take 1 tablet (200 mg total) by mouth 2 (two) times daily. What changed:  medication strength See the new instructions.   traZODone 100 MG tablet Commonly known as: DESYREL Take 100 mg by mouth at bedtime as needed for sleep.   Vitamin D (Ergocalciferol) 1.25 MG (50000 UNIT) Caps capsule Commonly known as: DRISDOL Take 50,000 Units by mouth every Monday.        Follow-up Information     [facility] Follow up.   Specialty: Home Health Services Why: the office will call to schedule home health physical therapy visits next week on Wednesday 7/5 Contact information: [address] [place] [address] [phone]         [name] Mattocks, DO. Schedule an appointment as soon  as possible for a visit in 1 week(s).   Specialty: Family Medicine Contact information: [place] [address] [phone]         [place] [place] [facility]. Schedule an appointment as soon as possible for a visit in 2 week(s).   Specialty: Neurosurgery Contact information: [place] [address] [phone]                Discharge Exam: Danley Danker Weights   12/15/21 1746  Weight: 95 kg   General exam: Appears calm and comfortable  Respiratory system: Clear to auscultation. Respiratory effort normal. Cardiovascular system: S1 & S2 heard, RRR. No JVD, murmurs, rubs, gallops or clicks. No pedal edema. Gastrointestinal system: Abdomen is nondistended, soft and nontender. No organomegaly or masses felt. Normal bowel sounds heard. Central nervous system: Alert and oriented. No focal neurological deficits. Extremities: Symmetric 5 x 5 power. Skin: No rashes, lesions or ulcers Psychiatry: Judgement and insight appear normal. Mood & affect appropriate.    Condition at discharge: fair  The results of  significant diagnostics from this hospitalization (including imaging, microbiology, ancillary and laboratory) are listed below for reference.   Imaging Studies: MR LUMBAR SPINE WO CONTRAST  Result Date: 12/18/2021 CLINICAL DATA:  Initial evaluation for low back pain. EXAM: MRI LUMBAR SPINE WITHOUT CONTRAST TECHNIQUE: Multiplanar, multisequence MR imaging of the lumbar spine was performed. No intravenous contrast was administered. COMPARISON:  Radiograph from 12/16/2021. FINDINGS: Segmentation: Standard. Lowest well-formed disc space labeled the L5-S1 level. Alignment: Physiologic with preservation of the normal lumbar lordosis. No listhesis. Vertebrae: Vertebral body height maintained without acute or chronic fracture. Bone marrow signal intensity within normal limits. No discrete or worrisome osseous lesions or abnormal marrow edema. Conus medullaris and cauda equina: Conus extends to the L1-2 level. Conus and cauda equina appear normal. Paraspinal and other soft tissues: Paraspinous soft tissues within normal limits. Simple T2 hyperintense cyst partially visualize within the right kidney, benign in appearance, no follow-up imaging recommended. Disc levels: L1-2:  Unremarkable. L2-3: Degenerative disc space narrowing with diffuse disc bulge and disc desiccation. Disc bulge slightly asymmetric to the left with reactive endplate spurring. Mild facet and ligament flavum hypertrophy with associated trace joint effusions. No significant spinal stenosis. Foramina remain patent. L3-4: Degenerative intervertebral disc space narrowing with diffuse disc bulge and disc desiccation. Mild reactive endplate spurring. Superimposed right subarticular disc protrusion with annular fissure (series 5, image 22). Mild facet hypertrophy. Resultant mild narrowing of the right lateral recess. Central canal remains patent. Mild left L3 foraminal stenosis. Right neural foramen remains patent. L4-5: Disc desiccation with mild disc  bulge. Shallow left extraforaminal disc protrusion with annular fissure closely approximates the exiting left L4 nerve root (series 5, image 29). Mild to moderate facet and ligament flavum hypertrophy with associated trace joint effusions. Borderline mild narrowing of the lateral recesses bilaterally. Central canal remains patent. Mild left L4 foraminal stenosis. Right neural foramen patent. L5-S1: Disc desiccation with intervertebral disc space narrowing. Superimposed central disc protrusion with slight superior migration. Protruding disc closely approximates and/or contacts both of the descending S1 nerve roots as they course through the lateral recesses, slightly greater on the left. Mild to moderate facet hypertrophy. Mild narrowing of the lateral recesses bilaterally. Central canal remains patent. No significant foraminal stenosis. IMPRESSION: 1. Central disc protrusion with slight superior migration at L5-S1, closely approximating and potentially affecting either of the descending S1 nerve roots as they course through the lateral recesses,  slightly greater on the left. 2. Shallow left extraforaminal disc protrusion at L4-5, closely approximating and potentially irritating the exiting left L4 nerve root. 3. Right subarticular disc protrusion at L3-4 with resultant mild right lateral recess stenosis. Electronically Signed   By: Rise Mu M.D.   On: 12/18/2021 21:27   DG Lumbar Spine 2-3 Views  Result Date: 12/16/2021 CLINICAL DATA:  Back pain. EXAM: LUMBAR SPINE - 2-3 VIEW COMPARISON:  Lumbar spine radiographs 05/14/2020. FINDINGS: There are 5 lumbar type vertebral bodies. The alignment is stable with a minimal convex right scoliosis which may be positional. No evidence of acute fracture or pars defect. There is mild multilevel disc space narrowing and intervertebral spurring, greatest at L2-3, L3-4 and L5-S1. Intrauterine device and cholecystectomy clips are noted. IMPRESSION: Mild lumbar  spondylosis.  No acute osseous findings. Electronically Signed   By: Carey Bullocks M.D.   On: 12/16/2021 16:37   MR CERVICAL SPINE WO CONTRAST  Result Date: 12/16/2021 CLINICAL DATA:  Cervical radiculopathy. EXAM: MRI CERVICAL SPINE WITHOUT CONTRAST TECHNIQUE: Multiplanar, multisequence MR imaging of the cervical spine was performed. No intravenous contrast was administered. COMPARISON:  MRI cervical spine 01/13/2010. CT cervical spine 05/14/2020 FINDINGS: Alignment: Normal Vertebrae: Normal bone marrow.  Negative for fracture or mass Cord: Cord evaluation limited by motion. Allowing for motion, no cord signal abnormality identified. Posterior Fossa, vertebral arteries, paraspinal tissues: Negative Disc levels: C2-3: Negative C3-4: Mild disc bulging.  Negative for stenosis C4-5: Negative C5-6: Mild disc degeneration with mild disc space narrowing and disc desiccation. Mild to moderate left foraminal narrowing due to uncinate spurring. Right foramen patent. Central canal patent C6-7: Mild disc degeneration. Diffuse uncinate spurring. Mild left foraminal narrowing due to spurring. Central canal patent C7-T1: Negative IMPRESSION: Disc degeneration and spurring on the left at C5-6 and C6-7 causing left foraminal narrowing. Electronically Signed   By: Marlan Palau M.D.   On: 12/16/2021 13:36   EEG adult  Result Date: 12/16/2021 Charlsie Quest, MD     12/16/2021  8:27 AM Patient Name: SIANNA GAROFANO MRN: [id number] Epilepsy Attending: Charlsie Quest Referring Physician/Provider: Caryl Pina, MD Date: 12/16/2021 Duration: 21.20 mins Patient history: 61 year old female with seizure-like activity.  EEG to evaluate for seizure. Level of alertness: Awake, asleep AEDs during EEG study: TPM Technical aspects: This EEG study was done with scalp electrodes positioned according to the 10-20 International system of electrode placement. Electrical activity was acquired at a sampling rate of 500Hz  and reviewed with  a high frequency filter of 70Hz  and a low frequency filter of 1Hz . EEG data were recorded continuously and digitally stored. Description: The posterior dominant rhythm consists of 8 Hz activity of moderate voltage (25-35 uV) seen predominantly in posterior head regions, symmetric and reactive to eye opening and eye closing. leep was characterized by vertex waves, sleep spindles (12 to 14 Hz), maximal frontocentral region. Hyperventilation and photic stimulation were not performed.   IMPRESSION: This study is within normal limits. No seizures or epileptiform discharges were seen throughout the recording.   MR BRAIN WO CONTRAST  Result Date: 12/15/2021 CLINICAL DATA:  Initial evaluation for acute neuro deficit, stroke suspected. EXAM: MRI HEAD WITHOUT CONTRAST TECHNIQUE: Multiplanar, multiecho pulse sequences of the brain and surrounding structures were obtained without intravenous contrast. COMPARISON:  Prior CT from earlier the same day. FINDINGS: Brain: Cerebral volume within normal limits. No significant cerebral white matter disease or other focal parenchymal signal abnormality. No abnormal foci of restricted diffusion  to suggest acute or subacute ischemia. Gray-white matter differentiation maintained. No areas of chronic cortical infarction. No acute or chronic intracranial blood products. No mass lesion, midline shift, or mass effect. No hydrocephalus. Cavum et septum pellucidum noted. No extra-axial fluid collection. Incidental note made of a tiny 3.5 mm parahippocampal cyst at the left hip campus (series 20, image 15), likely incidental in nature and of doubtful significance. No other intrinsic temporal lobe abnormality. Vascular: Major intracranial vascular flow voids are maintained. Skull and upper cervical spine: Craniocervical junction within normal limits. Bone marrow signal intensity normal. No scalp soft tissue abnormality. Sinuses/Orbits: Globes orbital soft tissues within normal  limits. Paranasal sinuses are largely clear. No significant mastoid effusion. Other: None. IMPRESSION: Normal brain MRI.  No acute intracranial abnormality identified. Electronically Signed   By: Jeannine Boga M.D.   On: 12/15/2021 22:57   CT HEAD CODE STROKE WO CONTRAST  Result Date: 12/15/2021 CLINICAL DATA:  Code stroke. Provided history: Neuro deficit, acute, stroke suspected. Additional history provided: Aphasia. EXAM: CT HEAD WITHOUT CONTRAST TECHNIQUE: Contiguous axial images were obtained from the base of the skull through the vertex without intravenous contrast. RADIATION DOSE REDUCTION: This exam was performed according to the departmental dose-optimization program which includes automated exposure control, adjustment of the mA and/or kV according to patient size and/or use of iterative reconstruction technique. COMPARISON:  Prior head CT examinations 12/02/2020 and earlier. Brain MRI 02/18/2013. FINDINGS: Brain: Cerebral volume is normal. Cavum septum pellucidum and cavum vergae. There is no acute intracranial hemorrhage. No demarcated cortical infarct. No extra-axial fluid collection. No evidence of an intracranial mass. No midline shift. Vascular: No hyperdense vessel. Atherosclerotic calcifications. Skull: No fracture or aggressive osseous lesion. Sinuses/Orbits: No mass or acute finding within the imaged orbits. Minimal mucosal thickening scattered within the bilateral ethmoid sinuses. ASPECTS ([place] Stroke Program Early CT Score) - Ganglionic level infarction (caudate, lentiform nuclei, internal capsule, insula, M1-M3 cortex): 7 - Supraganglionic infarction (M4-M6 cortex): 3 Total score (0-10 with 10 being normal): 10 These results were communicated to Dr. Cheral Marker at 6:04 pmon 6/27/2023by text page via the [facility] messaging system. IMPRESSION: No evidence of acute intracranial abnormality. ASPECTS is 10. Electronically Signed   By: Kellie Simmering D.O.   On: 12/15/2021 18:04     Microbiology: Results for orders placed or performed during the hospital encounter of 01/13/21  SARS CORONAVIRUS 2 (TAT 6-24 HRS) Nasopharyngeal Nasopharyngeal Swab     Status: None   Collection Time: 01/13/21 10:30 AM   Specimen: Nasopharyngeal Swab  Result Value Ref Range Status   SARS Coronavirus 2 NEGATIVE NEGATIVE Final    Comment: (NOTE) SARS-CoV-2 target nucleic acids are NOT DETECTED.  The SARS-CoV-2 RNA is generally detectable in upper and lower respiratory specimens during the acute phase of infection. Negative results do not preclude SARS-CoV-2 infection, do not rule out co-infections with other pathogens, and should not be used as the sole basis for treatment or other patient management decisions. Negative results must be combined with clinical observations, patient history, and epidemiological information. The expected result is Negative.  Fact Sheet for Patients: SugarRoll.be  Fact Sheet for Healthcare Providers: https://www.woods-mathews.com/  This test is not yet approved or cleared by the [place] FDA and  has been authorized for detection and/or diagnosis of SARS-CoV-2 by FDA under an Emergency Use Authorization (EUA). This EUA will remain  in effect (meaning this test can be used) for the duration of the COVID-19 declaration under Se ction 564(b)(1) of the Act, 21  U.S.C. section 360bbb-3(b)(1), unless the authorization is terminated or revoked sooner.  Performed at [facility], [place] [address] [place], [place] [address]     Labs: CBC: No results for input(s): "WBC", "NEUTROABS", "HGB", "HCT", "MCV", "PLT" in the last 168 hours. Basic Metabolic Panel: Recent Labs  Lab 12/17/21 1813 12/18/21 1418  NA  --  141  K 5.4* 4.2  CL  --  112*  CO2  --  22  GLUCOSE  --  88  BUN  --  11  CREATININE  --  1.24*  CALCIUM  --  8.9   Liver Function Tests: No results for input(s): "AST", "ALT", "ALKPHOS",  "BILITOT", "PROT", "ALBUMIN" in the last 168 hours. CBG: Recent Labs  Lab 12/18/21 1307  GLUCAP 74    Discharge time spent: 46 minutes.   Signed: Hosie Poisson, MD [facility] 12/24/2021

## 2021-12-25 ENCOUNTER — Ambulatory Visit: Payer: Medicaid Other | Attending: Internal Medicine

## 2021-12-25 DIAGNOSIS — M6281 Muscle weakness (generalized): Secondary | ICD-10-CM | POA: Insufficient documentation

## 2021-12-25 DIAGNOSIS — R2689 Other abnormalities of gait and mobility: Secondary | ICD-10-CM | POA: Insufficient documentation

## 2021-12-30 ENCOUNTER — Telehealth: Payer: Self-pay

## 2021-12-30 ENCOUNTER — Ambulatory Visit: Payer: Medicaid Other

## 2021-12-30 DIAGNOSIS — R2689 Other abnormalities of gait and mobility: Secondary | ICD-10-CM | POA: Diagnosis not present

## 2021-12-30 DIAGNOSIS — M6281 Muscle weakness (generalized): Secondary | ICD-10-CM | POA: Diagnosis not present

## 2021-12-30 NOTE — Therapy (Signed)
OUTPATIENT PHYSICAL THERAPY NEURO EVALUATION   Patient Name: Kirsten Walsh MRN: [id number] DOB:05/25/1961, 61 y.o., female Today's Date: 12/30/2021   PCP: [name] Mattocks, DO REFERRING PROVIDER: Kathlen Mody, MD     Past Medical History:  Diagnosis Date   Arthritis    Chest pain    Chronic lower back pain    "right side to mid back" (07/06/2017)   Family history of adverse reaction to anesthesia    "daughter did; not sure happened"   Gall stones    History of anaphylaxis 06/26/2014   History of kidney stones    Hypertension    Migraine    "qd" (07/06/2017)   Syncope and collapse 07/06/2017    sitting on the commode; developed nausea and passed out; hit head on shower and suffered a laceration; woke up in "a pool of blood"   Past Surgical History:  Procedure Laterality Date   CHONDROPLASTY Left 08/17/2019   Procedure: CHONDROPLASTY;  Surgeon: Frederico Hamman, MD;  Location: [facility];  Service: Orthopedics;  Laterality: Left;   CYSTOSCOPY W/ STONE MANIPULATION  ~ 2006   INTRAUTERINE DEVICE INSERTION     "initially put in in 04/2002; changed prn" (02/14/2013)   KNEE ARTHROSCOPY WITH LATERAL MENISECTOMY  08/17/2019   Procedure: KNEE ARTHROSCOPY WITH LATERAL MENISECTOMY;  Surgeon: Frederico Hamman, MD;  Location: [facility];  Service: Orthopedics;;   KNEE ARTHROSCOPY WITH MEDIAL MENISECTOMY Left 08/17/2019   Procedure: KNEE ARTHROSCOPY WITH MEDIAL MENISECTOMY;  Surgeon: Frederico Hamman, MD;  Location:  [facility]ERY [facility];  Service: Orthopedics;  Laterality: Left;   LAPAROSCOPIC CHOLECYSTECTOMY  ~ 2004   TOTAL KNEE ARTHROPLASTY Left 01/16/2021   Procedure: TOTAL KNEE ARTHROPLASTY;  Surgeon: Beverely Low, MD;  Location: [facility];  Service: Orthopedics;  Laterality: Left;  with adductor canal   Patient Active Problem List   Diagnosis Date Noted   Seizures (HCC) 12/17/2021   Seizure-like activity (HCC) 12/15/2021   S/P TKR (total knee  replacement), left 01/18/2021   H/O total knee replacement, left 01/16/2021   Cervical cancer screening 03/09/2020   Encounter for administration of vaccine 03/09/2020   Preoperative clearance 07/24/2019   Right arm pain 06/12/2019   Syncope 07/06/2017   Chronic daily headache 09/02/2015   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   Syncopal episodes 09/02/2015   Right arm weakness 08/05/2015   Edema of right arm and right leg 09/07/2014   Intractable migraine with status migrainosus 06/10/2014   Chronic migraine without aura, intractable, with status migrainosus    Bradycardia 02/18/2013   Left knee pain 01/16/2013   Cardiomyopathy 10/20/2011   Leg swelling 09/23/2011   Complex regional pain syndrome of right lower extremity 05/03/2011   Chronic pain 03/29/2011   NECK PAIN, RIGHT 02/03/2010   ALLERGIC RHINITIS 01/30/2009   DIZZINESS 07/16/2008   Nonintractable chronic migraine 04/26/2007   OBESITY, NOS 08/18/2006   HYPERTENSION, BENIGN SYSTEMIC 08/18/2006    ONSET DATE: 12/15/2021   REFERRING DIAG: R56.9 (ICD-10-CM) - Seizure (HCC)   THERAPY DIAG:  No diagnosis found.  Rationale for Evaluation and Treatment Rehabilitation  SUBJECTIVE:  SUBJECTIVE STATEMENT: Patient reports doing okay. Does currently have a migraine on R side (Eye-> occiput into neck). Patient reports having migraines daily since her 19yo son was born.  Pt accompanied by: self  PERTINENT HISTORY: chronic migraine, chronic back pain, hypertension, and mild renal insufficiency   PAIN:  Are you having pain? Yes: NPRS scale: 9/10 Pain location: R sided head; low back  Pain description: sharp, shooting  Aggravating factors: lying, sitting, heat, cold "everything"  Relieving factors: sometimes heat/cold, oxy + tizanidine  PRECAUTIONS:  Fall and Other: seizure?  WEIGHT BEARING RESTRICTIONS No  FALLS: Has patient fallen in last 6 months? Yes. Number of falls 1-2; "passed out"   LIVING ENVIRONMENT: Lives with: lives with their son Lives in: House/apartment Stairs: Yes: Internal: 10 to bedroom; 10 downstairs to familyroom steps; on left going up and none when going down to family room and External: 4 steps; none Has following equipment at home:  owns RW, cean from knee sx, but does not want ot use them   PLOF: Independent and Vocation/Vocational requirements: planning to go back to school to get MSW  PATIENT GOALS "I want to relieve some back pain"   OBJECTIVE:   DIAGNOSTIC FINDINGS: 6/27- MRI and CT clear 6/28- EEG normal  COGNITION: Overall cognitive status: Within functional limits for tasks assessed   SENSATION: Reports N/T intermittently in R hand and R toes  COORDINATION: WFL, but R AROM slightly limited due to pain with both finger/nose and heel/shin  EDEMA:  Reports increased swelling in B knees/feet   POSTURE: No Significant postural limitations   LOWER EXTREMITY MMT:    MMT deferred due to pain, but full AROM  BED MOBILITY:  Reports mild difficulty due to height of bed  TRANSFERS: Assistive device utilized: None  Sit to stand: Complete Independence Stand to sit: Complete Independence Chair to chair: Complete Independence  STAIRS:  Level of Assistance: Complete Independence  Stair Negotiation Technique: Alternating Pattern  with Single Rail on Right Single Rail on Left  Number of Stairs: 4   Height of Stairs: 6   GAIT: Gait pattern: step through pattern, decreased arm swing- Right, decreased arm swing- Left, and antalgic Distance walked: clinic Assistive device utilized: None Level of assistance: SBA   FUNCTIONAL TESTs:  (+) slump R LE  [facility] PT Assessment - 12/30/21 0001       Standardized Balance Assessment   Standardized Balance Assessment 10 meter walk test    10 Meter  Walk .36m/s      Functional Gait  Assessment   Gait assessed  Yes    Gait Level Surface Walks 20 ft in less than 7 sec but greater than 5.5 sec, uses assistive device, slower speed, mild gait deviations, or deviates 6-10 in outside of the 12 in walkway width.    Change in Gait Speed Able to change speed, demonstrates mild gait deviations, deviates 6-10 in outside of the 12 in walkway width, or no gait deviations, unable to achieve a major change in velocity, or uses a change in velocity, or uses an assistive device.    Gait with Horizontal Head Turns Performs head turns with moderate changes in gait velocity, slows down, deviates 10-15 in outside 12 in walkway width but recovers, can continue to walk.    Gait with Vertical Head Turns Performs task with slight change in gait velocity (eg, minor disruption to smooth gait path), deviates 6 - 10 in outside 12 in walkway width or uses assistive device  Gait and Pivot Turn Pivot turns safely in greater than 3 sec and stops with no loss of balance, or pivot turns safely within 3 sec and stops with mild imbalance, requires small steps to catch balance.    Step Over Obstacle Is able to step over one shoe box (4.5 in total height) but must slow down and adjust steps to clear box safely. May require verbal cueing.    Gait with Narrow Base of Support Ambulates 7-9 steps.    Gait with Eyes Closed Walks 20 ft, uses assistive device, slower speed, mild gait deviations, deviates 6-10 in outside 12 in walkway width. Ambulates 20 ft in less than 9 sec but greater than 7 sec.    Ambulating Backwards Walks 20 ft, uses assistive device, slower speed, mild gait deviations, deviates 6-10 in outside 12 in walkway width.    Steps Alternating feet, must use rail.    Total Score 18              TODAY'S TREATMENT:  N/A eval   PATIENT EDUCATION: Education details: PT POC, OM results, log roll Person educated: Patient Education method: Holiday representative Education comprehension: verbalized understanding   HOME EXERCISE PROGRAM: To be provided    GOALS: Goals reviewed with patient? Yes  SHORT TERM GOALS: Target date: 01/20/2022  Pt will be independent with initial HEP for improved balance and functional strength  Baseline: to be provided Goal status: INITIAL  2.  Pt will improve FGA to >/= 23/30 to demonstrate improved balance and reduced fall risk  Baseline: 18/30 Goal status: INITIAL  3.  Pt will improve gait speed to >/= .73M/S  to demonstrate improved community ambulation  Baseline: .50m/s Goal status: INITIAL  4.  Patient will complete oswestry and LTG to updated as appropriate Baseline: to be assessed  Goal status: INITIAL   LONG TERM GOALS: Target date: 02/10/2022  Pt will be independent with final HEP for improved balance and functional strength  Baseline: to be provided Goal status: INITIAL  2.  Pt will improve FGA to >/= 28/30 to demonstrate improved balance and reduced fall risk  Baseline: 18/30 Goal status: INITIAL  3.  Pt will improve gait speed to >/= .63m/s  to demonstrate improved community ambulation  Baseline: .46m/s Goal status: INITIAL   ASSESSMENT:  CLINICAL IMPRESSION: Patient is a 61 y.o. female who was seen today for physical therapy evaluation and treatment for gait impairment, LBP and balance related to recent hospitalization. Patient with clear imaging, including EEG. Continues to report daily migraines rated at up to 10/10 with little relief. Cervical guarding noted with head rotation. (+) slump test to R LE and pain to any resistance. Patient unable to tolerate any resistance to assess MMTs formally. Patient reports that there is no position of comfort for her back pain and just about every movement reproduces her back pain including: trunk rotation, posterior pelvic tilt, anterior pelvic tilt, sitting, standing, laying down. Patient demonstrates increased fall risk as noted by  score of 18/30 on  Functional Gait Assessment.   <22/30 = predictive of falls, <20/30 = fall in 6 months, <18/30 = predictive of falls in PD MCID: 5 points stroke population, 4 points geriatric population (ANPTA Core Set of Outcome Measures for Adults with Neurologic Conditions, 2018). 10 Meter Walk Test: Patient instructed to walk 10 meters (32.8 ft) as quickly and as safely as possible at their normal speed x2 and at a fast speed x2. Time measured from 2 meter  mark to 8 meter mark to accommodate ramp-up and ramp-down.  Normal speed: .58m/s Cut off scores: <0.4 m/s = household Ambulator, 0.4-0.8 m/s = limited community Ambulator, >0.8 m/s = community Ambulator, >1.2 m/s = crossing a street, <1.0 = increased fall risk MCID 0.05 m/s (small), 0.13 m/s (moderate), 0.06 m/s (significant)  (ANPTA Core Set of Outcome Measures for Adults with Neurologic Conditions, 2018). Patient would benefit from skilled PT in order to address the above mentioned impairments, but would also benefit from further medical work up due to the unclear cause of her recent hospitalization and her intractable migraines and LBP.     OBJECTIVE IMPAIRMENTS Abnormal gait, decreased activity tolerance, decreased balance, decreased endurance, decreased knowledge of condition, decreased mobility, difficulty walking, decreased strength, dizziness, hypomobility, improper body mechanics, and pain.   ACTIVITY LIMITATIONS carrying, lifting, bending, sitting, standing, squatting, sleeping, stairs, transfers, bed mobility, reach over head, hygiene/grooming, locomotion level, and caring for others  PARTICIPATION LIMITATIONS: meal prep, cleaning, laundry, interpersonal relationship, driving, shopping, community activity, occupation, yard work, and school  PERSONAL FACTORS Behavior pattern, Fitness, Past/current experiences, Social background, Time since onset of injury/illness/exacerbation, and 1-2 comorbidities: intractable migraines, HTN  are  also affecting patient's functional outcome.   REHAB POTENTIAL: Fair time since onset  CLINICAL DECISION MAKING: Unstable/unpredictable  EVALUATION COMPLEXITY: High  PLAN: PT FREQUENCY: 2x/week  PT DURATION: 6 weeks  PLANNED INTERVENTIONS: Therapeutic exercises, Therapeutic activity, Neuromuscular re-education, Balance training, Gait training, Patient/Family education, Self Care, Joint mobilization, Stair training, Vestibular training, Canalith repositioning, Visual/preceptual remediation/compensation, Orthotic/Fit training, DME instructions, Aquatic Therapy, Dry Needling, Cognitive remediation, Spinal mobilization, Cryotherapy, Moist heat, Taping, Manual therapy, and Re-evaluation  PLAN FOR NEXT SESSION: oswestry, HEP, investigate back further?   Westley Foots, PT Westley Foots, PT, DPT, CBIS  12/30/2021, 3:08 PM

## 2021-12-30 NOTE — Telephone Encounter (Signed)
Hello,   I am evaluating and treating this patient at [facility] [address]. The eval referral code is for seizures (R56.9 (ICD-10-CM) - Seizure (HCC) ). My understanding is that a seizure was ruled out while she was in the hospital based on the EEG, as well, PT is unable to treat "seizure;" however, we are able to treat gait abnormalities and imbalance, etc. Would you please send a new referral with a diagnosis code applicable to PT.   Thank you,   Merry Lofty, PT, DPT, CBIS

## 2022-01-05 ENCOUNTER — Encounter: Payer: Self-pay | Admitting: Physical Therapy

## 2022-01-05 ENCOUNTER — Ambulatory Visit: Payer: Medicaid Other | Admitting: Physical Therapy

## 2022-01-05 DIAGNOSIS — M6281 Muscle weakness (generalized): Secondary | ICD-10-CM

## 2022-01-05 DIAGNOSIS — R2689 Other abnormalities of gait and mobility: Secondary | ICD-10-CM

## 2022-01-05 NOTE — Therapy (Unsigned)
OUTPATIENT PHYSICAL THERAPY TREATMENT NOTE   Patient Name: Kirsten Walsh MRN: [id number] DOB:07-Nov-1960, 61 y.o., female 65 Date: 01/06/2022  PCP: Wells Guiles, DO  REFERRING PROVIDER: Hosie Poisson, MD     END OF SESSION:   PT End of Session - 01/05/22 1619     Visit Number 2    Number of Visits 13    Date for PT Re-Evaluation 02/10/22    Authorization Type  medicaid    PT Start Time 1617    PT Stop Time 1700    PT Time Calculation (min) 43 min    Activity Tolerance Patient limited by pain    Behavior During Therapy [facility] for tasks assessed/performed             Past Medical History:  Diagnosis Date   Arthritis    Chest pain    Chronic lower back pain    "right side to mid back" (07/06/2017)   Family history of adverse reaction to anesthesia    "daughter did; not sure happened"   Gall stones    History of anaphylaxis 06/26/2014   History of kidney stones    Hypertension    Migraine    "qd" (07/06/2017)   Syncope and collapse 07/06/2017    sitting on the commode; developed nausea and passed out; hit head on shower and suffered a laceration; woke up in "a pool of blood"   Past Surgical History:  Procedure Laterality Date   CHONDROPLASTY Left 08/17/2019   Procedure: CHONDROPLASTY;  Surgeon: Earlie Server, MD;  Location: [place];  Service: Orthopedics;  Laterality: Left;   CYSTOSCOPY W/ STONE MANIPULATION  ~ 2006   INTRAUTERINE DEVICE INSERTION     "initially put in in 04/2002; changed prn" (02/14/2013)   KNEE ARTHROSCOPY WITH LATERAL MENISECTOMY  08/17/2019   Procedure: KNEE ARTHROSCOPY WITH LATERAL MENISECTOMY;  Surgeon: Earlie Server, MD;  Location: [place];  Service: Orthopedics;;   KNEE ARTHROSCOPY WITH MEDIAL MENISECTOMY Left 08/17/2019   Procedure: KNEE ARTHROSCOPY WITH MEDIAL MENISECTOMY;  Surgeon: Earlie Server, MD;  Location: [place];  Service: Orthopedics;  Laterality: Left;    LAPAROSCOPIC CHOLECYSTECTOMY  ~ 2004   TOTAL KNEE ARTHROPLASTY Left 01/16/2021   Procedure: TOTAL KNEE ARTHROPLASTY;  Surgeon: Netta Cedars, MD;  Location: [facility];  Service: Orthopedics;  Laterality: Left;  with adductor canal   Patient Active Problem List   Diagnosis Date Noted   Seizures ([place]) 12/17/2021   Seizure-like activity ([place]) 12/15/2021   S/P TKR (total knee replacement), left 01/18/2021   H/O total knee replacement, left 01/16/2021   Cervical cancer screening 03/09/2020   Encounter for administration of vaccine 03/09/2020   Preoperative clearance 07/24/2019   Right arm pain 06/12/2019   Syncope 07/06/2017   Chronic daily headache 09/02/2015   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   Syncopal episodes 09/02/2015   Right arm weakness 08/05/2015   Edema of right arm and right leg 09/07/2014   Intractable migraine with status migrainosus 06/10/2014   Chronic migraine without aura, intractable, with status migrainosus    Bradycardia 02/18/2013   Left knee pain 01/16/2013   Cardiomyopathy 10/20/2011   Leg swelling 09/23/2011   Complex regional pain syndrome of right lower extremity 05/03/2011   Chronic pain 03/29/2011   NECK PAIN, RIGHT 02/03/2010   ALLERGIC RHINITIS 01/30/2009   DIZZINESS 07/16/2008   Nonintractable chronic migraine 04/26/2007   OBESITY, NOS 08/18/2006   HYPERTENSION, BENIGN SYSTEMIC 08/18/2006    REFERRING DIAG: R56.9 (ICD-10-CM) -  Seizure (HCC)   THERAPY DIAG:  Muscle weakness (generalized)  Other abnormalities of gait and mobility  Rationale for Evaluation and Treatment Rehabilitation  PERTINENT HISTORY: chronic migraine, chronic back pain, hypertension, and mild renal insufficiency   PRECAUTIONS: Fall and Other: seizure?   SUBJECTIVE: No new complaints. No falls.   PAIN:  Are you having pain? Yes NPRS scale: 7/10 Pain location: low back Pain orientation: Lower  PAIN TYPE: chronic Pain description:  stabbing at times, "in and out, it goes  downward"   Aggravating factors: all movements Relieving factors: pain med's at times help  PAIN:  Are you having pain? Yes NPRS scale: 8-9/10 Pain location: headache Pain orientation: Other: head   PAIN TYPE: chronic Pain description: constant, dull, stabbing, aching, and varies in intensity   Aggravating factors: sudden temperature changes and severe heat or cold Relieving factors: pain med's at times help  TODAY'S TREATMENT:  SELF CARE: Had pt fill out Oswestry Low Back Disability Questionnaire with score of 22 placing pt in Moderate Disablity  STRENGTHENING  Issued ex's to HEP for gentle core strengthening and stretching with no incr pain reported with performance. Attempted single knee to chest stretch with pt unable to perform due to increasing shoulder pain and/or knee pain. Refer to Med[employer] program for full details.      PATIENT EDUCATION: Education details: Furniture conservator/restorer; initial HEP Person educated: Patient Education method: Medical illustrator Education comprehension: verbalized understanding     HOME EXERCISE PROGRAM: Access Code: [email or web address] URL: [email or web address] Date: 01/05/2022 Prepared by: Sallyanne Kuster  Exercises - Supine Transversus Abdominis Bracing - Hands on Ground  - 1 x daily - 5 x weekly - 1 sets - 10 reps - 5 seconds hold - Supine Posterior Pelvic Tilt  - 1 x daily - 5 x weekly - 1 sets - 10 reps - Bent Knee Fallouts with Alternating Legs  - 1 x daily - 5 x weekly - 1 sets - 10 reps - Lower Trunk Rotations  - 1 x daily - 5 x weekly - 1 sets - 5 reps       GOALS: Goals reviewed with patient? Yes   SHORT TERM GOALS: Target date: 01/20/2022   Pt will be independent with initial HEP for improved balance and functional strength  Baseline: to be provided Goal status: INITIAL   2.  Pt will improve FGA to >/= 23/30 to demonstrate improved balance and reduced fall risk   Baseline: 18/30 Goal status: INITIAL    3.  Pt will improve gait speed to >/= .55M/S  to demonstrate improved community ambulation   Baseline: .17m/s Goal status: INITIAL   4.  Patient will complete oswestry and LTG to updated as appropriate Baseline: to be assessed  Goal status: INITIAL     LONG TERM GOALS: Target date: 02/10/2022   Pt will be independent with final HEP for improved balance and functional strength  Baseline: to be provided Goal status: INITIAL   2.  Pt will improve FGA to >/= 28/30 to demonstrate improved balance and reduced fall risk   Baseline: 18/30 Goal status: INITIAL   3.  Pt will improve gait speed to >/= .71m/s  to demonstrate improved community ambulation   Baseline: .75m/s Goal status: INITIAL     ASSESSMENT:   CLINICAL IMPRESSION: Today's skilled session initially focused on performance of Oswestry questionnaire. Remainder of session focused on establishment of an HEP. Will continue to work on strengthening and flexibility as able to address pain.  OBJECTIVE IMPAIRMENTS Abnormal gait, decreased activity tolerance, decreased balance, decreased endurance, decreased knowledge of condition, decreased mobility, difficulty walking, decreased strength, dizziness, hypomobility, improper body mechanics, and pain.    ACTIVITY LIMITATIONS carrying, lifting, bending, sitting, standing, squatting, sleeping, stairs, transfers, bed mobility, reach over head, hygiene/grooming, locomotion level, and caring for others   PARTICIPATION LIMITATIONS: meal prep, cleaning, laundry, interpersonal relationship, driving, shopping, community activity, occupation, yard work, and school   PERSONAL FACTORS Behavior pattern, Fitness, Past/current experiences, Social background, Time since onset of injury/illness/exacerbation, and 1-2 comorbidities: intractable migraines, HTN  are also affecting patient's functional outcome.    REHAB POTENTIAL: Fair time since onset   CLINICAL DECISION MAKING:  Unstable/unpredictable   EVALUATION COMPLEXITY: High   PLAN: PT FREQUENCY: 2x/week   PT DURATION: 6 weeks   PLANNED INTERVENTIONS: Therapeutic exercises, Therapeutic activity, Neuromuscular re-education, Balance training, Gait training, Patient/Family education, Self Care, Joint mobilization, Stair training, Vestibular training, Canalith repositioning, Visual/preceptual remediation/compensation, Orthotic/Fit training, DME instructions, Aquatic Therapy, Dry Needling, Cognitive remediation, Spinal mobilization, Cryotherapy, Moist heat, Taping, Manual therapy, and Re-evaluation   PLAN FOR NEXT SESSION: how is HEP?, add to HEP as indicated, investigate back further?    Sallyanne Kuster, PTA, [facility] [address] [place], [place] [address] [phone] 01/06/22, 11:35 AM

## 2022-01-07 ENCOUNTER — Encounter: Payer: Self-pay | Admitting: Physical Therapy

## 2022-01-07 ENCOUNTER — Ambulatory Visit: Payer: Medicaid Other | Admitting: Physical Therapy

## 2022-01-07 DIAGNOSIS — R2689 Other abnormalities of gait and mobility: Secondary | ICD-10-CM

## 2022-01-07 DIAGNOSIS — M6281 Muscle weakness (generalized): Secondary | ICD-10-CM

## 2022-01-07 NOTE — Therapy (Signed)
OUTPATIENT PHYSICAL THERAPY TREATMENT NOTE   Patient Name: Kirsten Walsh MRN: [id number] DOB:1961/05/01, 61 y.o., female Today's Date: 01/08/2022  PCP: [name] Mattocks, DO  REFERRING PROVIDER: Kathlen Mody, MD     END OF SESSION:   PT End of Session - 01/07/22 1630     Visit Number 3    Number of Visits 13    Date for PT Re-Evaluation 02/10/22    Authorization Type [place] medicaid    PT Start Time 1536    PT Stop Time 1614    PT Time Calculation (min) 38 min    Activity Tolerance Patient limited by pain;Patient tolerated treatment well    Behavior During Therapy [facility] for tasks assessed/performed              Past Medical History:  Diagnosis Date   Arthritis    Chest pain    Chronic lower back pain    "right side to mid back" (07/06/2017)   Family history of adverse reaction to anesthesia    "daughter did; not sure happened"   Gall stones    History of anaphylaxis 06/26/2014   History of kidney stones    Hypertension    Migraine    "qd" (07/06/2017)   Syncope and collapse 07/06/2017    sitting on the commode; developed nausea and passed out; hit head on shower and suffered a laceration; woke up in "a pool of blood"   Past Surgical History:  Procedure Laterality Date   CHONDROPLASTY Left 08/17/2019   Procedure: CHONDROPLASTY;  Surgeon: Frederico Hamman, MD;  Location: [facility];  Service: Orthopedics;  Laterality: Left;   CYSTOSCOPY W/ STONE MANIPULATION  ~ 2006   INTRAUTERINE DEVICE INSERTION     "initially put in in 04/2002; changed prn" (02/14/2013)   KNEE ARTHROSCOPY WITH LATERAL MENISECTOMY  08/17/2019   Procedure: KNEE ARTHROSCOPY WITH LATERAL MENISECTOMY;  Surgeon: Frederico Hamman, MD;  Location: [facility];  Service: Orthopedics;;   KNEE ARTHROSCOPY WITH MEDIAL MENISECTOMY Left 08/17/2019   Procedure: KNEE ARTHROSCOPY WITH MEDIAL MENISECTOMY;  Surgeon: Frederico Hamman, MD;  Location: [facility];  Service:  Orthopedics;  Laterality: Left;   LAPAROSCOPIC CHOLECYSTECTOMY  ~ 2004   TOTAL KNEE ARTHROPLASTY Left 01/16/2021   Procedure: TOTAL KNEE ARTHROPLASTY;  Surgeon: Beverely Low, MD;  Location: [facility];  Service: Orthopedics;  Laterality: Left;  with adductor canal   Patient Active Problem List   Diagnosis Date Noted   Seizures (HCC) 12/17/2021   Seizure-like activity (HCC) 12/15/2021   S/P TKR (total knee replacement), left 01/18/2021   H/O total knee replacement, left 01/16/2021   Cervical cancer screening 03/09/2020   Encounter for administration of vaccine 03/09/2020   Preoperative clearance 07/24/2019   Right arm pain 06/12/2019   Syncope 07/06/2017   Chronic daily headache 09/02/2015   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   Syncopal episodes 09/02/2015   Right arm weakness 08/05/2015   Edema of right arm and right leg 09/07/2014   Intractable migraine with status migrainosus 06/10/2014   Chronic migraine without aura, intractable, with status migrainosus    Bradycardia 02/18/2013   Left knee pain 01/16/2013   Cardiomyopathy 10/20/2011   Leg swelling 09/23/2011   Complex regional pain syndrome of right lower extremity 05/03/2011   Chronic pain 03/29/2011   NECK PAIN, RIGHT 02/03/2010   ALLERGIC RHINITIS 01/30/2009   DIZZINESS 07/16/2008   Nonintractable chronic migraine 04/26/2007   OBESITY, NOS 08/18/2006   HYPERTENSION, BENIGN SYSTEMIC 08/18/2006  REFERRING DIAG: R56.9 (ICD-10-CM) - Seizure (HCC)   THERAPY DIAG:  Muscle weakness (generalized)  Other abnormalities of gait and mobility  Rationale for Evaluation and Treatment Rehabilitation  PERTINENT HISTORY: chronic migraine, chronic back pain, hypertension, and mild renal insufficiency   PRECAUTIONS: Fall and Other: seizure?   SUBJECTIVE: No new complaints. No falls. Was sore with pain after last session. Has not tried them at home as "I didn't feel well".   PAIN:  Are you having pain? Yes NPRS scale: 7-8/10 Pain  location: low back Pain orientation: Lower  PAIN TYPE: chronic Pain description:  stabbing at times, "in and out, it goes downward"   Aggravating factors: all movements Relieving factors: pain med's at times help  PAIN:  Are you having pain? Yes NPRS scale: 6/10 Pain location: headache Pain orientation: Other: head   PAIN TYPE: chronic Pain description: constant, dull, stabbing, aching, and varies in intensity   Aggravating factors: sudden temperature changes and severe heat or cold Relieving factors: pain med's at times help, had some sleep/nap today  TODAY'S TREATMENT:  STRENGTHENING  Scifit concurrent with moist hot pack to lower back with UE/LE's level 2.0  x 5 minutes with goal ~40-50 steps per minute.  Prone with therapy pillow at head/pillow under abdomen Arms down by sides on mat- alternating raises for 10 each side (aqua man ex) Arms out in T form- alternating raises for 10 each side (bird man) Arms up above head- alternating raies for 10 each side (super man)  Seated in chair with moist hot pack to back: Posterior shoulder rolls x 10 reps Scapular retraction for 10 reps   MANUAL THERAPY: In prone with therapy pillow to head/pillow under abdomen. All manual therapy performed to tolerance and for decreased pain/muscle tightness. Most mm tightness palpated at thoracic spine area.  Use of myofascial ball to lumbar>thoracic spine paraspinal muscles, traps/rhomboids for decreased tightness Soft tissue mobs to bil upper traps/rhomboids  Suboccipital release for 20 second holds x 3 reps Passive upper trap stretching to bil sides Muscle energy techniques as follows: concurrent with gentle cervical distraction had pt work on scapular retractions, then scapular depressions for spinal mobility and muscle stretching.        PATIENT EDUCATION: Education details continue with current HEP Person educated: Patient Education method: Medical illustrator Education  comprehension: verbalized understanding     HOME EXERCISE PROGRAM: Access Code: [email or web address] URL: [email or web address] Date: 01/05/2022 Prepared by: Sallyanne Kuster  Exercises - Supine Transversus Abdominis Bracing - Hands on Ground  - 1 x daily - 5 x weekly - 1 sets - 10 reps - 5 seconds hold - Supine Posterior Pelvic Tilt  - 1 x daily - 5 x weekly - 1 sets - 10 reps - Bent Knee Fallouts with Alternating Legs  - 1 x daily - 5 x weekly - 1 sets - 10 reps - Lower Trunk Rotations  - 1 x daily - 5 x weekly - 1 sets - 5 reps       GOALS: Goals reviewed with patient? Yes   SHORT TERM GOALS: Target date: 01/20/2022   Pt will be independent with initial HEP for improved balance and functional strength  Baseline: to be provided Goal status: INITIAL   2.  Pt will improve FGA to >/= 23/30 to demonstrate improved balance and reduced fall risk Baseline: 18/30 Goal status: INITIAL   3.  Pt will improve gait speed to >/= .64M/S  to demonstrate improved community ambulation Baseline: .30m/s Goal status:  INITIAL   4.  Patient will complete oswestry and LTG to updated as appropriate Baseline: to be assessed  Goal status: INITIAL     LONG TERM GOALS: Target date: 02/10/2022   Pt will be independent with final HEP for improved balance and functional strength  Baseline: to be provided Goal status: INITIAL   2.  Pt will improve FGA to >/= 28/30 to demonstrate improved balance and reduced fall risk Baseline: 18/30 Goal status: INITIAL   3.  Pt will improve gait speed to >/= .32m/s  to demonstrate improved community ambulation Baseline: .35m/s Goal status: INITIAL     ASSESSMENT:   CLINICAL IMPRESSION: Today's skilled session continued with gentle strengthening/stretching and trial of manual techniques to address mm tightness/pain with no changes reported by pt at end of session.    OBJECTIVE IMPAIRMENTS Abnormal gait, decreased activity tolerance, decreased balance, decreased  endurance, decreased knowledge of condition, decreased mobility, difficulty walking, decreased strength, dizziness, hypomobility, improper body mechanics, and pain.    ACTIVITY LIMITATIONS carrying, lifting, bending, sitting, standing, squatting, sleeping, stairs, transfers, bed mobility, reach over head, hygiene/grooming, locomotion level, and caring for others   PARTICIPATION LIMITATIONS: meal prep, cleaning, laundry, interpersonal relationship, driving, shopping, community activity, occupation, yard work, and school   PERSONAL FACTORS Behavior pattern, Fitness, Past/current experiences, Social background, Time since onset of injury/illness/exacerbation, and 1-2 comorbidities: intractable migraines, HTN  are also affecting patient's functional outcome.    REHAB POTENTIAL: Fair time since onset   CLINICAL DECISION MAKING: Unstable/unpredictable   EVALUATION COMPLEXITY: High   PLAN: PT FREQUENCY: 2x/week   PT DURATION: 6 weeks   PLANNED INTERVENTIONS: Therapeutic exercises, Therapeutic activity, Neuromuscular re-education, Balance training, Gait training, Patient/Family education, Self Care, Joint mobilization, Stair training, Vestibular training, Canalith repositioning, Visual/preceptual remediation/compensation, Orthotic/Fit training, DME instructions, Aquatic Therapy, Dry Needling, Cognitive remediation, Spinal mobilization, Cryotherapy, Moist heat, Taping, Manual therapy, and Re-evaluation   PLAN FOR NEXT SESSION:  How is HEP? How did she feel after last session? Continue with gentle strengthening/stretching, pain reduction methods as able, body mechanics education    Sallyanne Kuster, [place], [facility] [address] [place], [place] [address] [phone] 01/08/22, 3:01 PM

## 2022-01-12 ENCOUNTER — Ambulatory Visit: Payer: Medicaid Other | Admitting: Physical Therapy

## 2022-01-12 DIAGNOSIS — M6281 Muscle weakness (generalized): Secondary | ICD-10-CM

## 2022-01-12 DIAGNOSIS — R2689 Other abnormalities of gait and mobility: Secondary | ICD-10-CM | POA: Diagnosis not present

## 2022-01-12 NOTE — Therapy (Signed)
OUTPATIENT PHYSICAL THERAPY TREATMENT NOTE   Patient Name: Kirsten Walsh MRN: [id number] DOB:[birth year], 61 y.o., female 67 Date: 01/12/2022  PCP: Wells Guiles, DO  REFERRING PROVIDER: Hosie Poisson, MD     END OF SESSION:   PT End of Session - 01/12/22 1456     Visit Number 4    Number of Visits 13    Date for PT Re-Evaluation 02/10/22    Authorization Type [place] medicaid    PT Start Time 5825   Pt arrived late   PT Stop Time 1531    PT Time Calculation (min) 36 min    Activity Tolerance Patient limited by pain;Patient tolerated treatment well    Behavior During Therapy [facility] for tasks assessed/performed;Flat affect              Past Medical History:  Diagnosis Date   Arthritis    Chest pain    Chronic lower back pain    "right side to mid back" (07/06/2017)   Family history of adverse reaction to anesthesia    "daughter did; not sure happened"   Gall stones    History of anaphylaxis 06/26/2014   History of kidney stones    Hypertension    Migraine    "qd" (07/06/2017)   Syncope and collapse 07/06/2017    sitting on the commode; developed nausea and passed out; hit head on shower and suffered a laceration; woke up in "a pool of blood"   Past Surgical History:  Procedure Laterality Date   CHONDROPLASTY Left 08/17/2019   Procedure: CHONDROPLASTY;  Surgeon: Earlie Server, MD;  Location: [facility];  Service: Orthopedics;  Laterality: Left;   CYSTOSCOPY W/ STONE MANIPULATION  ~ 2006   INTRAUTERINE DEVICE INSERTION     "initially put in in 04/2002; changed prn" (02/14/2013)   KNEE ARTHROSCOPY WITH LATERAL MENISECTOMY  08/17/2019   Procedure: KNEE ARTHROSCOPY WITH LATERAL MENISECTOMY;  Surgeon: Earlie Server, MD;  Location: [facility];  Service: Orthopedics;;   KNEE ARTHROSCOPY WITH MEDIAL MENISECTOMY Left 08/17/2019   Procedure: KNEE ARTHROSCOPY WITH MEDIAL MENISECTOMY;  Surgeon: Earlie Server, MD;  Location: [facility];  Service: Orthopedics;  Laterality: Left;   LAPAROSCOPIC CHOLECYSTECTOMY  ~ 2004   TOTAL KNEE ARTHROPLASTY Left 01/16/2021   Procedure: TOTAL KNEE ARTHROPLASTY;  Surgeon: Netta Cedars, MD;  Location: [facility];  Service: Orthopedics;  Laterality: Left;  with adductor canal   Patient Active Problem List   Diagnosis Date Noted   Seizures ([place]) 12/17/2021   Seizure-like activity ([place]) 12/15/2021   S/P TKR (total knee replacement), left 01/18/2021   H/O total knee replacement, left 01/16/2021   Cervical cancer screening 03/09/2020   Encounter for administration of vaccine 03/09/2020   Preoperative clearance 07/24/2019   Right arm pain 06/12/2019   Syncope 07/06/2017   Chronic daily headache 09/02/2015   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   Syncopal episodes 09/02/2015   Right arm weakness 08/05/2015   Edema of right arm and right leg 09/07/2014   Intractable migraine with status migrainosus 06/10/2014   Chronic migraine without aura, intractable, with status migrainosus    Bradycardia 02/18/2013   Left knee pain 01/16/2013   Cardiomyopathy 10/20/2011   Leg swelling 09/23/2011   Complex regional pain syndrome of right lower extremity 05/03/2011   Chronic pain 03/29/2011   NECK PAIN, RIGHT 02/03/2010   ALLERGIC RHINITIS 01/30/2009   DIZZINESS 07/16/2008   Nonintractable chronic migraine 04/26/2007   OBESITY, NOS 08/18/2006   HYPERTENSION, BENIGN  SYSTEMIC 08/18/2006    REFERRING DIAG: R56.9 (ICD-10-CM) - Seizure ([place])   THERAPY DIAG:  Muscle weakness (generalized)  Rationale for Evaluation and Treatment Rehabilitation  PERTINENT HISTORY: chronic migraine, chronic back pain, hypertension, and mild renal insufficiency   PRECAUTIONS: Fall and Other: seizure?   SUBJECTIVE: Pt reports she is doing her HEP every day. "They're okay". Reports things are "okay". "I just started doing this so I don't know what to say". No new changes.   PAIN:  Are you having pain? Yes NPRS  scale: 6-7/10 Pain location: low back Pain orientation: Lower  PAIN TYPE: chronic Pain description:  stabbing at times, "in and out, it goes downward"   Aggravating factors: all movements Relieving factors: pain med's at times help  PAIN:  Are you having pain? Yes NPRS scale: 8/10 Pain location: headache Pain orientation: Other: head   PAIN TYPE: chronic Pain description: constant, dull, stabbing, aching, and varies in intensity   Aggravating factors: sudden temperature changes and severe heat or cold Relieving factors: pain med's at times help, had some sleep/nap today  TODAY'S TREATMENT:  Ther Ex  Supine on mat for improved core stability, posterior chain strength and pain modulation:  -Hooklying x2 minutes, as transition from sitting <>supine painful for pt -TA activation w/posterior pelvic tilt x10  -TA activation w/bridge, 2x8. Min cues to maintain breathing, as pt demonstrating valsalva at top of rep.  -Open books laying on L side only (pt reports L shoulder impingement and would not perform w/LUE) for improved thoracic rotation and mobility, x10 reps. Min cues to maintain gaze on R hand for increased ROM and stretch  -Supine marches w/TA contraction, x10 per side w/3 second isometric hold.   SciFit muli-peaks level 4 for 8 minutes using BUE/BLEs for dynamic cardiovascular conditioning, UE/LE coordination and pain modulation.   Pt reported pain in back as 7/10 and in head as 8/10 following session.     PATIENT EDUCATION: Education details: continue with current HEP Person educated: Patient Education method: Customer service manager Education comprehension: verbalized understanding     HOME EXERCISE PROGRAM: Access Code: [email or web address] URL: [email or web address] Date: 01/05/2022 Prepared by: Willow Ora  Exercises - Supine Transversus Abdominis Bracing - Hands on Ground  - 1 x daily - 5 x weekly - 1 sets - 10 reps - 5 seconds hold - Supine Posterior  Pelvic Tilt  - 1 x daily - 5 x weekly - 1 sets - 10 reps - Bent Knee Fallouts with Alternating Legs  - 1 x daily - 5 x weekly - 1 sets - 10 reps - Lower Trunk Rotations  - 1 x daily - 5 x weekly - 1 sets - 5 reps       GOALS: Goals reviewed with patient? Yes   SHORT TERM GOALS: Target date: 01/20/2022   Pt will be independent with initial HEP for improved balance and functional strength  Baseline: to be provided Goal status: INITIAL   2.  Pt will improve FGA to >/= 23/30 to demonstrate improved balance and reduced fall risk Baseline: 18/30 Goal status: INITIAL   3.  Pt will improve gait speed to >/= .51M/S  to demonstrate improved community ambulation Baseline: .76m/s Goal status: INITIAL   4.  Patient will complete oswestry and LTG to updated as appropriate Baseline: 22/50 (moderate disability)  Goal status: MET     LONG TERM GOALS: Target date: 02/10/2022   Pt will be independent with final HEP for improved balance and functional strength  Baseline: to be provided Goal status: INITIAL   2.  Pt will improve FGA to >/= 28/30 to demonstrate improved balance and reduced fall risk Baseline: 18/30 Goal status: INITIAL   3.  Pt will improve gait speed to >/= .70m/s  to demonstrate improved community ambulation Baseline: .16m/s Goal status: INITIAL  4. Pt will improve Oswestry Low Back Disability Questionnaire to </= 12/50 for improved subjective functional ability   Baseline: 22/50 (moderate disability)   Goal Status: NEW      ASSESSMENT:   CLINICAL IMPRESSION: Session limited due to pt arriving late. Emphasis of skilled PT session on core stability, posterior chain strength and improved thoracic ROM. Pt tolerated session well but moves very slowly w/fear-avoidance behavior and pain. Pt required extra time to initiate movement and reported history of L shoulder impingement which irritated her during session, exercises modified to accomodate. Pt w/no increase or decrease in  pain during session. Continue POC.    OBJECTIVE IMPAIRMENTS Abnormal gait, decreased activity tolerance, decreased balance, decreased endurance, decreased knowledge of condition, decreased mobility, difficulty walking, decreased strength, dizziness, hypomobility, improper body mechanics, and pain.    ACTIVITY LIMITATIONS carrying, lifting, bending, sitting, standing, squatting, sleeping, stairs, transfers, bed mobility, reach over head, hygiene/grooming, locomotion level, and caring for others   PARTICIPATION LIMITATIONS: meal prep, cleaning, laundry, interpersonal relationship, driving, shopping, community activity, occupation, yard work, and school   PERSONAL FACTORS Behavior pattern, Fitness, Past/current experiences, Social background, Time since onset of injury/illness/exacerbation, and 1-2 comorbidities: intractable migraines, HTN  are also affecting patient's functional outcome.    REHAB POTENTIAL: Fair time since onset   CLINICAL DECISION MAKING: Unstable/unpredictable   EVALUATION COMPLEXITY: High   PLAN: PT FREQUENCY: 2x/week   PT DURATION: 6 weeks   PLANNED INTERVENTIONS: Therapeutic exercises, Therapeutic activity, Neuromuscular re-education, Balance training, Gait training, Patient/Family education, Self Care, Joint mobilization, Stair training, Vestibular training, Canalith repositioning, Visual/preceptual remediation/compensation, Orthotic/Fit training, DME instructions, Aquatic Therapy, Dry Needling, Cognitive remediation, Spinal mobilization, Cryotherapy, Moist heat, Taping, Manual therapy, and Re-evaluation   PLAN FOR NEXT SESSION:  How is HEP? How did she feel after last session? Continue with gentle strengthening/stretching, pain reduction methods as able, body mechanics education, core stabilization, thoracic mobility    Cruzita Lederer Khiree Bukhari, PT, DPT [facility] [address] [place], [place]  [address] Phone:  [phone] Fax:   [phone] 01/12/22, 3:40 PM

## 2022-01-14 ENCOUNTER — Ambulatory Visit: Payer: Medicaid Other | Admitting: Physical Therapy

## 2022-01-14 ENCOUNTER — Encounter: Payer: Self-pay | Admitting: Physical Therapy

## 2022-01-14 DIAGNOSIS — R2689 Other abnormalities of gait and mobility: Secondary | ICD-10-CM

## 2022-01-14 DIAGNOSIS — M6281 Muscle weakness (generalized): Secondary | ICD-10-CM | POA: Diagnosis not present

## 2022-01-14 NOTE — Therapy (Signed)
OUTPATIENT PHYSICAL THERAPY TREATMENT NOTE   Patient Name: Kirsten Walsh MRN: [id number] DOB:[birth year], 61 y.o., female Today's Date: 01/15/2022  PCP: [name] Mattocks, DO  REFERRING PROVIDER: Kathlen Mody, MD     END OF SESSION:   PT End of Session - 01/14/22 1535     Visit Number 5    Number of Visits 13    Date for PT Re-Evaluation 02/10/22    Authorization Type [place] medicaid    PT Start Time 1536   in bathroom prior to session   PT Stop Time 1620    PT Time Calculation (min) 44 min    Activity Tolerance Patient limited by pain;Patient tolerated treatment well    Behavior During Therapy [facility] for tasks assessed/performed;Flat affect              Past Medical History:  Diagnosis Date   Arthritis    Chest pain    Chronic lower back pain    "right side to mid back" (07/06/2017)   Family history of adverse reaction to anesthesia    "daughter did; not sure happened"   Gall stones    History of anaphylaxis 06/26/2014   History of kidney stones    Hypertension    Migraine    "qd" (07/06/2017)   Syncope and collapse 07/06/2017    sitting on the commode; developed nausea and passed out; hit head on shower and suffered a laceration; woke up in "a pool of blood"   Past Surgical History:  Procedure Laterality Date   CHONDROPLASTY Left 08/17/2019   Procedure: CHONDROPLASTY;  Surgeon: Frederico Hamman, MD;  Location: [facility];  Service: Orthopedics;  Laterality: Left;   CYSTOSCOPY W/ STONE MANIPULATION  ~ 2006   INTRAUTERINE DEVICE INSERTION     "initially put in in 04/2002; changed prn" (02/14/2013)   KNEE ARTHROSCOPY WITH LATERAL MENISECTOMY  08/17/2019   Procedure: KNEE ARTHROSCOPY WITH LATERAL MENISECTOMY;  Surgeon: Frederico Hamman, MD;  Location: [facility];  Service: Orthopedics;;   KNEE ARTHROSCOPY WITH MEDIAL MENISECTOMY Left 08/17/2019   Procedure: KNEE ARTHROSCOPY WITH MEDIAL MENISECTOMY;  Surgeon: Frederico Hamman, MD;  Location:  [facility];  Service: Orthopedics;  Laterality: Left;   LAPAROSCOPIC CHOLECYSTECTOMY  ~ 2004   TOTAL KNEE ARTHROPLASTY Left 01/16/2021   Procedure: TOTAL KNEE ARTHROPLASTY;  Surgeon: Beverely Low, MD;  Location: [facility];  Service: Orthopedics;  Laterality: Left;  with adductor canal   Patient Active Problem List   Diagnosis Date Noted   Seizures (HCC) 12/17/2021   Seizure-like activity (HCC) 12/15/2021   S/P TKR (total knee replacement), left 01/18/2021   H/O total knee replacement, left 01/16/2021   Cervical cancer screening 03/09/2020   Encounter for administration of vaccine 03/09/2020   Preoperative clearance 07/24/2019   Right arm pain 06/12/2019   Syncope 07/06/2017   Chronic daily headache 09/02/2015   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   Syncopal episodes 09/02/2015   Right arm weakness 08/05/2015   Edema of right arm and right leg 09/07/2014   Intractable migraine with status migrainosus 06/10/2014   Chronic migraine without aura, intractable, with status migrainosus    Bradycardia 02/18/2013   Left knee pain 01/16/2013   Cardiomyopathy 10/20/2011   Leg swelling 09/23/2011   Complex regional pain syndrome of right lower extremity 05/03/2011   Chronic pain 03/29/2011   NECK PAIN, RIGHT 02/03/2010   ALLERGIC RHINITIS 01/30/2009   DIZZINESS 07/16/2008   Nonintractable chronic migraine 04/26/2007   OBESITY, NOS 08/18/2006  HYPERTENSION, BENIGN SYSTEMIC 08/18/2006    REFERRING DIAG: R56.9 (ICD-10-CM) - Seizure ([place])   THERAPY DIAG:  Muscle weakness (generalized)  Other abnormalities of gait and mobility  Rationale for Evaluation and Treatment Rehabilitation  PERTINENT HISTORY: chronic migraine, chronic back pain, hypertension, and mild renal insufficiency   PRECAUTIONS: Fall and Other: seizure?   SUBJECTIVE: No new complaints. Has not been able to sleep well at all due pain.  PAIN:  Are you having pain? Yes NPRS scale: 7/10 Pain location: low  back Pain orientation: Lower right side PAIN TYPE: chronic Pain description:  stabbing at times, "in and out, it goes downward"   Aggravating factors: all movements Relieving factors: pain med's at times help  PAIN:  Are you having pain? Yes NPRS scale: 9/10 Pain location: headache Pain orientation: Other: head   PAIN TYPE: chronic Pain description: constant, dull, stabbing, aching, and varies in intensity   Aggravating factors: sudden temperature changes and severe heat or cold Relieving factors: pain med's at times help, had some sleep/nap today  TODAY'S TREATMENT:  SELF CARE: Discussed use of pillows between knee for side sleeping. Pt reports having tried this and does not find it comfortable. Discussed various style pillows- body contoured pillow and pregnancy pillow information provided for her reference after looking at them on [place].    STRENGTHENING  Hook lying on mat table - abdominal bracing for 5 second holds x 10 reps  with abdominal bracing for  - alternating marching x 10 reps each side - alternating hip fall outs x 10 reps each side - alternating lower trunk rotation left<>right for 10 reps each side - bridge x 10 reps (rest after 7 reps, then able to continue to 10)  Seated in chair with moist hot pack to back: Posterior shoulder rolls x 10 reps Scapular retraction for 10 reps  Seated on large blue pball: cues for abdominal bracing with all ex's performed. Min guard to min assist for balance.  - bouncing with tall posture for 30 seconds, 2 reps - lateral pelvic rocks x 10 reps each way - anterior/posterior pelvic rocks x 10 reps each way - with tall posture alternating marching x 10 reps each side - with tall posture alternating long arc quads x 10 reps each side    Pt reported pain in back as 8/10 and in head as 9/10 following session.     PATIENT EDUCATION: Education details: continue with current HEP Person educated: Patient Education method:  Customer service manager Education comprehension: verbalized understanding     HOME EXERCISE PROGRAM: Access Code: [email or web address] URL: [email or web address] Date: 01/05/2022 Prepared by: Willow Ora  Exercises - Supine Transversus Abdominis Bracing - Hands on Ground  - 1 x daily - 5 x weekly - 1 sets - 10 reps - 5 seconds hold - Supine Posterior Pelvic Tilt  - 1 x daily - 5 x weekly - 1 sets - 10 reps - Bent Knee Fallouts with Alternating Legs  - 1 x daily - 5 x weekly - 1 sets - 10 reps - Lower Trunk Rotations  - 1 x daily - 5 x weekly - 1 sets - 5 reps       GOALS: Goals reviewed with patient? Yes   SHORT TERM GOALS: Target date: 01/20/2022   Pt will be independent with initial HEP for improved balance and functional strength  Baseline: to be provided Goal status: INITIAL   2.  Pt will improve FGA to >/= 23/30 to demonstrate improved balance and  reduced fall risk Baseline: 18/30 Goal status: INITIAL   3.  Pt will improve gait speed to >/= .32M/S  to demonstrate improved community ambulation Baseline: .77m/s Goal status: INITIAL   4.  Patient will complete oswestry and LTG to updated as appropriate Baseline: 22/50 (moderate disability)  Goal status: MET     LONG TERM GOALS: Target date: 02/10/2022   Pt will be independent with final HEP for improved balance and functional strength  Baseline: to be provided Goal status: INITIAL   2.  Pt will improve FGA to >/= 28/30 to demonstrate improved balance and reduced fall risk Baseline: 18/30 Goal status: INITIAL   3.  Pt will improve gait speed to >/= .45m/s  to demonstrate improved community ambulation Baseline: .5m/s Goal status: INITIAL  4. Pt will improve Oswestry Low Back Disability Questionnaire to </= 12/50 for improved subjective functional ability   Baseline: 22/50 (moderate disability)   Goal Status: NEW      ASSESSMENT:   CLINICAL IMPRESSION: Skilled session continued to focus on core/LE  strengthening with gentle ex's with emphasis on abdominal bracing. Pt continues to need cues for core activation and to breathe with all ex's performed.      OBJECTIVE IMPAIRMENTS Abnormal gait, decreased activity tolerance, decreased balance, decreased endurance, decreased knowledge of condition, decreased mobility, difficulty walking, decreased strength, dizziness, hypomobility, improper body mechanics, and pain.    ACTIVITY LIMITATIONS carrying, lifting, bending, sitting, standing, squatting, sleeping, stairs, transfers, bed mobility, reach over head, hygiene/grooming, locomotion level, and caring for others   PARTICIPATION LIMITATIONS: meal prep, cleaning, laundry, interpersonal relationship, driving, shopping, community activity, occupation, yard work, and school   PERSONAL FACTORS Behavior pattern, Fitness, Past/current experiences, Social background, Time since onset of injury/illness/exacerbation, and 1-2 comorbidities: intractable migraines, HTN  are also affecting patient's functional outcome.    REHAB POTENTIAL: Fair time since onset   CLINICAL DECISION MAKING: Unstable/unpredictable   EVALUATION COMPLEXITY: High   PLAN: PT FREQUENCY: 2x/week   PT DURATION: 6 weeks   PLANNED INTERVENTIONS: Therapeutic exercises, Therapeutic activity, Neuromuscular re-education, Balance training, Gait training, Patient/Family education, Self Care, Joint mobilization, Stair training, Vestibular training, Canalith repositioning, Visual/preceptual remediation/compensation, Orthotic/Fit training, DME instructions, Aquatic Therapy, Dry Needling, Cognitive remediation, Spinal mobilization, Cryotherapy, Moist heat, Taping, Manual therapy, and Re-evaluation   PLAN FOR NEXT SESSION:  How is HEP? How did she feel after last session? STGs due 01/20/22 Continue with gentle strengthening/stretching, pain reduction methods as able, body mechanics education, core stabilization, thoracic mobility    Willow Ora, [place], [facility] [address] [place], [place] [address] [phone] 01/15/22, 1:42 PM

## 2022-01-18 DIAGNOSIS — R404 Transient alteration of awareness: Secondary | ICD-10-CM | POA: Diagnosis not present

## 2022-01-18 DIAGNOSIS — G894 Chronic pain syndrome: Secondary | ICD-10-CM | POA: Diagnosis not present

## 2022-01-18 DIAGNOSIS — G43719 Chronic migraine without aura, intractable, without status migrainosus: Secondary | ICD-10-CM | POA: Diagnosis not present

## 2022-01-19 ENCOUNTER — Ambulatory Visit: Payer: Medicaid Other | Attending: Internal Medicine

## 2022-01-19 DIAGNOSIS — M6281 Muscle weakness (generalized): Secondary | ICD-10-CM | POA: Diagnosis not present

## 2022-01-19 DIAGNOSIS — G894 Chronic pain syndrome: Secondary | ICD-10-CM | POA: Diagnosis not present

## 2022-01-19 DIAGNOSIS — Z419 Encounter for procedure for purposes other than remedying health state, unspecified: Secondary | ICD-10-CM | POA: Diagnosis not present

## 2022-01-19 DIAGNOSIS — R2689 Other abnormalities of gait and mobility: Secondary | ICD-10-CM | POA: Insufficient documentation

## 2022-01-19 DIAGNOSIS — H6122 Impacted cerumen, left ear: Secondary | ICD-10-CM | POA: Diagnosis not present

## 2022-01-19 DIAGNOSIS — I10 Essential (primary) hypertension: Secondary | ICD-10-CM | POA: Diagnosis not present

## 2022-01-19 DIAGNOSIS — E668 Other obesity: Secondary | ICD-10-CM | POA: Diagnosis not present

## 2022-01-19 DIAGNOSIS — Z6832 Body mass index (BMI) 32.0-32.9, adult: Secondary | ICD-10-CM | POA: Diagnosis not present

## 2022-01-19 DIAGNOSIS — M5136 Other intervertebral disc degeneration, lumbar region: Secondary | ICD-10-CM | POA: Diagnosis not present

## 2022-01-19 DIAGNOSIS — R7303 Prediabetes: Secondary | ICD-10-CM | POA: Diagnosis not present

## 2022-01-19 DIAGNOSIS — G43909 Migraine, unspecified, not intractable, without status migrainosus: Secondary | ICD-10-CM | POA: Diagnosis not present

## 2022-01-19 NOTE — Therapy (Signed)
OUTPATIENT PHYSICAL THERAPY TREATMENT NOTE   Patient Name: Kirsten Walsh MRN: [id number] DOB:28-Sep-1960, 61 y.o., female 71 Date: 01/19/2022  PCP: Wells Guiles, DO  REFERRING PROVIDER: Hosie Poisson, MD     END OF SESSION:   PT End of Session - 01/19/22 1455     Visit Number 6    Number of Visits 13    Date for PT Re-Evaluation 02/10/22    Authorization Type [place] medicaid    PT Start Time 6195   patient late   PT Stop Time 1531    PT Time Calculation (min) 38 min    Activity Tolerance Patient limited by pain    Behavior During Therapy Flat affect;Agitated              Past Medical History:  Diagnosis Date   Arthritis    Chest pain    Chronic lower back pain    "right side to mid back" (07/06/2017)   Family history of adverse reaction to anesthesia    "daughter did; not sure happened"   Gall stones    History of anaphylaxis 06/26/2014   History of kidney stones    Hypertension    Migraine    "qd" (07/06/2017)   Syncope and collapse 07/06/2017    sitting on the commode; developed nausea and passed out; hit head on shower and suffered a laceration; woke up in "a pool of blood"   Past Surgical History:  Procedure Laterality Date   CHONDROPLASTY Left 08/17/2019   Procedure: CHONDROPLASTY;  Surgeon: Earlie Server, MD;  Location: [facility];  Service: Orthopedics;  Laterality: Left;   CYSTOSCOPY W/ STONE MANIPULATION  ~ 2006   INTRAUTERINE DEVICE INSERTION     "initially put in in 04/2002; changed prn" (02/14/2013)   KNEE ARTHROSCOPY WITH LATERAL MENISECTOMY  08/17/2019   Procedure: KNEE ARTHROSCOPY WITH LATERAL MENISECTOMY;  Surgeon: Earlie Server, MD;  Location: [facility];  Service: Orthopedics;;   KNEE ARTHROSCOPY WITH MEDIAL MENISECTOMY Left 08/17/2019   Procedure: KNEE ARTHROSCOPY WITH MEDIAL MENISECTOMY;  Surgeon: Earlie Server, MD;  Location: [facility];  Service: Orthopedics;  Laterality: Left;    LAPAROSCOPIC CHOLECYSTECTOMY  ~ 2004   TOTAL KNEE ARTHROPLASTY Left 01/16/2021   Procedure: TOTAL KNEE ARTHROPLASTY;  Surgeon: Netta Cedars, MD;  Location[facility];  Service: Orthopedics;  Laterality: Left;  with adductor canal   Patient Active Problem List   Diagnosis Date Noted   Seizures ([place]) 12/17/2021   Seizure-like activity ([place]) 12/15/2021   S/P TKR (total knee replacement), left 01/18/2021   H/O total knee replacement, left 01/16/2021   Cervical cancer screening 03/09/2020   Encounter for administration of vaccine 03/09/2020   Preoperative clearance 07/24/2019   Right arm pain 06/12/2019   Syncope 07/06/2017   Chronic daily headache 09/02/2015   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   Syncopal episodes 09/02/2015   Right arm weakness 08/05/2015   Edema of right arm and right leg 09/07/2014   Intractable migraine with status migrainosus 06/10/2014   Chronic migraine without aura, intractable, with status migrainosus    Bradycardia 02/18/2013   Left knee pain 01/16/2013   Cardiomyopathy 10/20/2011   Leg swelling 09/23/2011   Complex regional pain syndrome of right lower extremity 05/03/2011   Chronic pain 03/29/2011   NECK PAIN, RIGHT 02/03/2010   ALLERGIC RHINITIS 01/30/2009   DIZZINESS 07/16/2008   Nonintractable chronic migraine 04/26/2007   OBESITY, NOS 08/18/2006   HYPERTENSION, BENIGN SYSTEMIC 08/18/2006    REFERRING DIAG:  R56.9 (ICD-10-CM) - Seizure ([place])   THERAPY DIAG:  Muscle weakness (generalized)  Other abnormalities of gait and mobility  Rationale for Evaluation and Treatment Rehabilitation  PERTINENT HISTORY: chronic migraine, chronic back pain, hypertension, and mild renal insufficiency   PRECAUTIONS: Fall and Other: seizure?   SUBJECTIVE: Patient reports doing "okay." She had a 2 hour long PCP appointment where "things were changed, he was concerned about my ear and my neck." PT unable to clarify further despite questions.   PAIN:  Are you having pain?  Yes NPRS scale: 7/10 (patient "hates ratings"- states that it's moderate to severe)  Pain location: low back Pain orientation: Lower right side PAIN TYPE: chronic Pain description:  stabbing at times, "in and out, it goes downward"   Aggravating factors: all movements Relieving factors: pain med's at times help  PAIN:  Are you having pain? Yes NPRS scale: 9/10 (patient "hates ratings"- states that it's moderate to severe)  Pain location: headache Pain orientation: Other: head   PAIN TYPE: chronic Pain description: constant, dull, stabbing, aching, and varies in intensity   Aggravating factors: sudden temperature changes and severe heat or cold Relieving factors: pain med's at times help, had some sleep/nap today  TODAY'S TREATMENT:  SELF CARE: -EXTENSIVE conversation regarding progress in therapy thus far -red flag of not knowing organic cause of LBP -importance of adhering to exercise program as prescribed  -intro to pain science (patient NOT receptive to this)      PATIENT EDUCATION: Education details: continue with current HEP Person educated: Patient Education method: Customer service manager Education comprehension: verbalized understanding     HOME EXERCISE PROGRAM: Access Code: [email or web address] URL: [email or web address] Date: 01/05/2022 Prepared by: Willow Ora  Exercises - Supine Transversus Abdominis Bracing - Hands on Ground  - 1 x daily - 5 x weekly - 1 sets - 10 reps - 5 seconds hold - Supine Posterior Pelvic Tilt  - 1 x daily - 5 x weekly - 1 sets - 10 reps - Bent Knee Fallouts with Alternating Legs  - 1 x daily - 5 x weekly - 1 sets - 10 reps - Lower Trunk Rotations  - 1 x daily - 5 x weekly - 1 sets - 5 reps       GOALS: Goals reviewed with patient? Yes   SHORT TERM GOALS: Target date: 01/20/2022   Pt will be independent with initial HEP for improved balance and functional strength  Baseline: to be provided Goal status: INITIAL   2.   Pt will improve FGA to >/= 23/30 to demonstrate improved balance and reduced fall risk Baseline: 18/30 Goal status: INITIAL   3.  Pt will improve gait speed to >/= .44M/S  to demonstrate improved community ambulation Baseline: .38ms Goal status: INITIAL   4.  Patient will complete oswestry and LTG to updated as appropriate Baseline: 22/50 (moderate disability)  Goal status: MET     LONG TERM GOALS: Target date: 02/10/2022   Pt will be independent with final HEP for improved balance and functional strength  Baseline: to be provided Goal status: INITIAL   2.  Pt will improve FGA to >/= 28/30 to demonstrate improved balance and reduced fall risk Baseline: 18/30 Goal status: INITIAL   3.  Pt will improve gait speed to >/= .663m  to demonstrate improved community ambulation Baseline: .3756mGoal status: INITIAL  4. Pt will improve Oswestry Low Back Disability Questionnaire to </= 12/50 for improved subjective functional ability   Baseline: 22/50 (moderate disability)  Goal Status: NEW      ASSESSMENT:   CLINICAL IMPRESSION: Patient initially refusing to rate her HA and LBP stating that it was irrelevant and too subjective. Patient does continue to report severe pain in her low back without known cause. Briefly discussed recent Xray findings with patient citing pain science research. Patient was not receptive to education regarding pain science. Patient also reporting that she only does her back HEP every once in a while and not every day. PT stating that results will not be seen unless exercises are completed as prescribed. Patient stating that when she had her L knee surgery she completed her exercises every day and she "saw results." Patient stating that she has an ultrasound tomorrow, unable to state who ordered the [place] and for what purpose. Patient also stating that her PCP "saw something" in her ear, but was unable to state what. PT encouraging patient to complete HEP as prescribed  for the next week to see if there is any noticeable progress in reducing her LBP symptoms. Continue POC.    OBJECTIVE IMPAIRMENTS Abnormal gait, decreased activity tolerance, decreased balance, decreased endurance, decreased knowledge of condition, decreased mobility, difficulty walking, decreased strength, dizziness, hypomobility, improper body mechanics, and pain.    ACTIVITY LIMITATIONS carrying, lifting, bending, sitting, standing, squatting, sleeping, stairs, transfers, bed mobility, reach over head, hygiene/grooming, locomotion level, and caring for others   PARTICIPATION LIMITATIONS: meal prep, cleaning, laundry, interpersonal relationship, driving, shopping, community activity, occupation, yard work, and school   PERSONAL FACTORS Behavior pattern, Fitness, Past/current experiences, Social background, Time since onset of injury/illness/exacerbation, and 1-2 comorbidities: intractable migraines, HTN  are also affecting patient's functional outcome.    REHAB POTENTIAL: Fair time since onset   CLINICAL DECISION MAKING: Unstable/unpredictable   EVALUATION COMPLEXITY: High   PLAN: PT FREQUENCY: 2x/week   PT DURATION: 6 weeks   PLANNED INTERVENTIONS: Therapeutic exercises, Therapeutic activity, Neuromuscular re-education, Balance training, Gait training, Patient/Family education, Self Care, Joint mobilization, Stair training, Vestibular training, Canalith repositioning, Visual/preceptual remediation/compensation, Orthotic/Fit training, DME instructions, Aquatic Therapy, Dry Needling, Cognitive remediation, Spinal mobilization, Cryotherapy, Moist heat, Taping, Manual therapy, and Re-evaluation   PLAN FOR NEXT SESSION:  How is HEP? How did she feel after last session? STGs due 01/20/22 Continue with gentle strengthening/stretching, pain reduction methods as able, body mechanics education, core stabilization, thoracic mobility, GOALS, how did the [place] go?, OSWESTRY    Debbora Dus, PT,  DPT, CBIS  01/19/22, 3:52 PM

## 2022-01-20 DIAGNOSIS — R404 Transient alteration of awareness: Secondary | ICD-10-CM | POA: Diagnosis not present

## 2022-01-21 ENCOUNTER — Ambulatory Visit: Payer: Medicaid Other | Admitting: Physical Therapy

## 2022-01-26 ENCOUNTER — Ambulatory Visit: Payer: Medicaid Other | Admitting: Physical Therapy

## 2022-01-28 ENCOUNTER — Ambulatory Visit: Payer: Medicaid Other | Admitting: Physical Therapy

## 2022-01-29 ENCOUNTER — Ambulatory Visit: Payer: Medicaid Other | Admitting: Physical Therapy

## 2022-01-29 ENCOUNTER — Encounter: Payer: Self-pay | Admitting: Physical Therapy

## 2022-01-29 DIAGNOSIS — M6281 Muscle weakness (generalized): Secondary | ICD-10-CM | POA: Diagnosis not present

## 2022-01-29 DIAGNOSIS — R2689 Other abnormalities of gait and mobility: Secondary | ICD-10-CM | POA: Diagnosis not present

## 2022-01-29 NOTE — Patient Instructions (Signed)
  Aquatic Therapy: What to Expect!  Where:  [facility] [address] [place], [place]  [address] [phone]  NOTE:  You will receive an automated phone message reminding you of your appointment and it will say the appointment is at the Rehab Center on [address]  We are working to fix this- just know that you will meet us at the pool!  How to Prepare: Please make sure you drink 8 ounces of water about one hour prior to your pool session A caregiver MUST attend the entire session with the patient.  The caregiver will be responsible for assisting with dressing as well as any toileting needs.  If the patient will be doing a home program this should likely be the person who will assist as well.  Patients must wear either their street shoes or pool shoes until they are ready to enter the pool with the therapist.  Patients must also wear either street shoes or pool shoes once exiting the pool to walk to the locker room.  This will helps us prevent slips and falls.  Please arrive 15 minutes early to prepare for your pool therapy session Sign in at the front desk on the clipboard marked for [place] You may use the locker rooms on your right and then enter directly into the recreation pool (NOT the competition pool) Please make sure to attend to any toileting needs prior to entering the pool Please be dressed in your swim suit and on the pool deck at least 5 minutes before your appointment Once on the pool deck your therapist will ask you to sign the Patient  Consent and Assignment of Benefits form Your therapist may take your blood pressure prior to, during and after your session if indicated  About the pool  and parking: Entering the pool Your therapist will assist you; there are 2 ways to enter:  stairs with railings or with a chair lift.   Your therapist will determine the most appropriate way for you. Water temperature is usually between 86-87 degrees There  may be other swimmers in the pool at the same time Parking is free.   Contact Info:     Appointments: [facility]  All sessions are 45 minutes   [address]  [address]     Please call the [facility] if   [place], [place]   [address]    you need to cancel or reschedule an appointment.  [phone]       

## 2022-01-29 NOTE — Therapy (Signed)
OUTPATIENT PHYSICAL THERAPY TREATMENT NOTE   Patient Name: Kirsten Walsh MRN: [id number] DOB:1961/02/25, 61 y.o., female 28 Date: 01/29/2022  PCP: Wells Guiles, DO  REFERRING PROVIDER: Hosie Poisson, MD     END OF SESSION:   PT End of Session - 01/29/22 1224     Visit Number 7    Number of Visits 13    Date for PT Re-Evaluation 02/10/22    Authorization Type [place] medicaid    PT Start Time 4765    PT Stop Time 4650    PT Time Calculation (min) 43 min    Activity Tolerance Patient tolerated treatment well;No increased pain    Behavior During Therapy WFL for tasks assessed/performed               Past Medical History:  Diagnosis Date   Arthritis    Chest pain    Chronic lower back pain    "right side to mid back" (07/06/2017)   Family history of adverse reaction to anesthesia    "daughter did; not sure happened"   Gall stones    History of anaphylaxis 06/26/2014   History of kidney stones    Hypertension    Migraine    "qd" (07/06/2017)   Syncope and collapse 07/06/2017    sitting on the commode; developed nausea and passed out; hit head on shower and suffered a laceration; woke up in "a pool of blood"   Past Surgical History:  Procedure Laterality Date   CHONDROPLASTY Left 08/17/2019   Procedure: CHONDROPLASTY;  Surgeon: Earlie Server, MD;  Location: [facility];  Service: Orthopedics;  Laterality: Left;   CYSTOSCOPY W/ STONE MANIPULATION  ~ 2006   INTRAUTERINE DEVICE INSERTION     "initially put in in 04/2002; changed prn" (02/14/2013)   KNEE ARTHROSCOPY WITH LATERAL MENISECTOMY  08/17/2019   Procedure: KNEE ARTHROSCOPY WITH LATERAL MENISECTOMY;  Surgeon: Earlie Server, MD;  Location: [facility];  Service: Orthopedics;;   KNEE ARTHROSCOPY WITH MEDIAL MENISECTOMY Left 08/17/2019   Procedure: KNEE ARTHROSCOPY WITH MEDIAL MENISECTOMY;  Surgeon: Earlie Server, MD;  Location: [facility];  Service: Orthopedics;   Laterality: Left;   LAPAROSCOPIC CHOLECYSTECTOMY  ~ 2004   TOTAL KNEE ARTHROPLASTY Left 01/16/2021   Procedure: TOTAL KNEE ARTHROPLASTY;  Surgeon: Netta Cedars, MD;  Location: [facility];  Service: Orthopedics;  Laterality: Left;  with adductor canal   Patient Active Problem List   Diagnosis Date Noted   Seizures ([place]) 12/17/2021   Seizure-like activity ([place]) 12/15/2021   S/P TKR (total knee replacement), left 01/18/2021   H/O total knee replacement, left 01/16/2021   Cervical cancer screening 03/09/2020   Encounter for administration of vaccine 03/09/2020   Preoperative clearance 07/24/2019   Right arm pain 06/12/2019   Syncope 07/06/2017   Chronic daily headache 09/02/2015   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   Syncopal episodes 09/02/2015   Right arm weakness 08/05/2015   Edema of right arm and right leg 09/07/2014   Intractable migraine with status migrainosus 06/10/2014   Chronic migraine without aura, intractable, with status migrainosus    Bradycardia 02/18/2013   Left knee pain 01/16/2013   Cardiomyopathy 10/20/2011   Leg swelling 09/23/2011   Complex regional pain syndrome of right lower extremity 05/03/2011   Chronic pain 03/29/2011   NECK PAIN, RIGHT 02/03/2010   ALLERGIC RHINITIS 01/30/2009   DIZZINESS 07/16/2008   Nonintractable chronic migraine 04/26/2007   OBESITY, NOS 08/18/2006   HYPERTENSION, BENIGN SYSTEMIC 08/18/2006  REFERRING DIAG: R56.9 (ICD-10-CM) - Seizure ([facility])   THERAPY DIAG:  Muscle weakness (generalized)  Other abnormalities of gait and mobility  Rationale for Evaluation and Treatment Rehabilitation  PERTINENT HISTORY: chronic migraine, chronic back pain, hypertension, and mild renal insufficiency   PRECAUTIONS: Fall and Other: seizure?   SUBJECTIVE: Pain is "okay today". Just left dentist office. Did have a fall a few days ago. Was trying to move a large piece with someone's help and her foot got caught. Fell down. Friend helped her up. Has a  bruise on her left arm from hitting arm on edge of fireplace.   PAIN:  Are you having pain? Yes NPRS scale: less than moderate Pain location: low back Pain orientation: Lower right side PAIN TYPE: chronic Pain description:  stabbing at times, "in and out, it goes downward"   Aggravating factors: all movements Relieving factors: pain med's at times help  PAIN:  Are you having pain? Yes NPRS scale:mild, less than a moderate level Pain location: headache Pain orientation: Other: head   PAIN TYPE: chronic Pain description: constant, dull, stabbing, aching, and varies in intensity   Aggravating factors: sudden temperature changes and severe heat or cold Relieving factors: pain med's at times help, had some sleep/nap today  TODAY'S TREATMENT:  SELF CARE: HEP is going well. Has also started walking with a friend and is planning to start swimming at the [facility] with her friend.    STRENGTHENING  Scifit UE/LE's level 2.0 x 6 minutes with goal >/= 50 steps per minute concurrent with moist hot pack to mid>lower back.     GAIT: Gait pattern: step through pattern Distance walked: around clinic with session Assistive device utilized: None Level of assistance: SBA Comments: no balance issues noted.    [facility] PT Assessment - 01/29/22 1041       Standardized Balance Assessment   Standardized Balance Assessment 10 meter walk test    10 Meter Walk 7.65 seconds= 0.77 m/s      Functional Gait  Assessment   Gait assessed  Yes    Gait Level Surface Walks 20 ft in less than 5.5 sec, no assistive devices, good speed, no evidence for imbalance, normal gait pattern, deviates no more than 6 in outside of the 12 in walkway width.   4.68 seconds   Change in Gait Speed Able to smoothly change walking speed without loss of balance or gait deviation. Deviate no more than 6 in outside of the 12 in walkway width.    Gait with Horizontal Head Turns Performs head turns smoothly with slight change in gait  velocity (eg, minor disruption to smooth gait path), deviates 6-10 in outside 12 in walkway width, or uses an assistive device.    Gait with Vertical Head Turns Performs task with slight change in gait velocity (eg, minor disruption to smooth gait path), deviates 6 - 10 in outside 12 in walkway width or uses assistive device    Gait and Pivot Turn Pivot turns safely within 3 sec and stops quickly with no loss of balance.    Step Over Obstacle Is able to step over one shoe box (4.5 in total height) without changing gait speed. No evidence of imbalance.    Gait with Narrow Base of Support Is able to ambulate for 10 steps heel to toe with no staggering.    Gait with Eyes Closed Walks 20 ft, no assistive devices, good speed, no evidence of imbalance, normal gait pattern, deviates no more than 6 in  outside 12 in walkway width. Ambulates 20 ft in less than 7 sec.   7.06 seconds   Ambulating Backwards Walks 20 ft, uses assistive device, slower speed, mild gait deviations, deviates 6-10 in outside 12 in walkway width.   12.88 sec's   Steps Alternating feet, must use rail.    Total Score 25    FGA comment: 25-28 low fall risk               PATIENT EDUCATION: Education details: continue with current HEP, plan to try an aquatic session to establish things to do her her gym pool, progress toward STGs. Person educated: Patient Education method: Customer service manager Education comprehension: verbalized understanding     HOME EXERCISE PROGRAM: Access Code: [email or web address] URL: [email or web address] Date: 01/05/2022 Prepared by: Willow Ora  Exercises - Supine Transversus Abdominis Bracing - Hands on Ground  - 1 x daily - 5 x weekly - 1 sets - 10 reps - 5 seconds hold - Supine Posterior Pelvic Tilt  - 1 x daily - 5 x weekly - 1 sets - 10 reps - Bent Knee Fallouts with Alternating Legs  - 1 x daily - 5 x weekly - 1 sets - 10 reps - Lower Trunk Rotations  - 1 x daily - 5 x weekly - 1  sets - 5 reps       GOALS: Goals reviewed with patient? Yes   SHORT TERM GOALS: Target date: 01/20/2022   Pt will be independent with initial HEP for improved balance and functional strength  Baseline:  01/29/22: met with current program Goal status: MET   2.  Pt will improve FGA to >/= 23/30 to demonstrate improved balance and reduced fall risk Baseline: 01/29/22: 25/30 scored today Goal status: MET   3.  Pt will improve gait speed to >/= .79M/S  to demonstrate improved community ambulation Baseline: 01/29/22: 0.77 M/S Goal status: MET   4.  Patient will complete oswestry and LTG to updated as appropriate Baseline: 22/50 (moderate disability)  Goal status: MET     LONG TERM GOALS: Target date: 02/10/2022   Pt will be independent with final HEP for improved balance and functional strength  Baseline: to be provided Goal status: INITIAL   2.  Pt will improve FGA to >/= 28/30 to demonstrate improved balance and reduced fall risk Baseline: 18/30 Goal status: INITIAL   3.  Pt will improve gait speed to >/= .59m/s  to demonstrate improved community ambulation Baseline: 01/29/22: 0.77 M/S Goal status: MET  4. Pt will improve Oswestry Low Back Disability Questionnaire to </= 12/50 for improved subjective functional ability   Baseline: 22/50 (moderate disability)   Goal Status: NEW      ASSESSMENT:   CLINICAL IMPRESSION: Today's skilled session focused on progress toward STGs with all goals met and LTG  #3 for gait speed also met. Remainder of session continued to work on strengthening and activity tolerance. The pt appears to be making progress toward goals and should benefit from continued PT to progress toward unmet goals.    OBJECTIVE IMPAIRMENTS Abnormal gait, decreased activity tolerance, decreased balance, decreased endurance, decreased knowledge of condition, decreased mobility, difficulty walking, decreased strength, dizziness, hypomobility, improper body mechanics, and  pain.    ACTIVITY LIMITATIONS carrying, lifting, bending, sitting, standing, squatting, sleeping, stairs, transfers, bed mobility, reach over head, hygiene/grooming, locomotion level, and caring for others   PARTICIPATION LIMITATIONS: meal prep, cleaning, laundry, interpersonal relationship, driving, shopping, community activity, occupation, yard work, and school  PERSONAL FACTORS Behavior pattern, Fitness, Past/current experiences, Social background, Time since onset of injury/illness/exacerbation, and 1-2 comorbidities: intractable migraines, HTN  are also affecting patient's functional outcome.    REHAB POTENTIAL: Fair time since onset   CLINICAL DECISION MAKING: Unstable/unpredictable   EVALUATION COMPLEXITY: High   PLAN: PT FREQUENCY: 2x/week   PT DURATION: 6 weeks   PLANNED INTERVENTIONS: Therapeutic exercises, Therapeutic activity, Neuromuscular re-education, Balance training, Gait training, Patient/Family education, Self Care, Joint mobilization, Stair training, Vestibular training, Canalith repositioning, Visual/preceptual remediation/compensation, Orthotic/Fit training, DME instructions, Aquatic Therapy, Dry Needling, Cognitive remediation, Spinal mobilization, Cryotherapy, Moist heat, Taping, Manual therapy, and Re-evaluation   PLAN FOR NEXT SESSION:  Continue with gentle strengthening/stretching, pain reduction methods as able, body mechanics education, core stabilization, thoracic mobility, aquatic session to establish pool HEP to perform at her gym pool    Willow Ora, [place], [facility] [address] [place], [place] [address] [phone] 01/29/22, 12:31 PM

## 2022-02-01 DIAGNOSIS — Z79899 Other long term (current) drug therapy: Secondary | ICD-10-CM | POA: Diagnosis not present

## 2022-02-01 DIAGNOSIS — Z131 Encounter for screening for diabetes mellitus: Secondary | ICD-10-CM | POA: Diagnosis not present

## 2022-02-01 DIAGNOSIS — M25512 Pain in left shoulder: Secondary | ICD-10-CM | POA: Diagnosis not present

## 2022-02-01 DIAGNOSIS — Z1159 Encounter for screening for other viral diseases: Secondary | ICD-10-CM | POA: Diagnosis not present

## 2022-02-01 DIAGNOSIS — M129 Arthropathy, unspecified: Secondary | ICD-10-CM | POA: Diagnosis not present

## 2022-02-01 DIAGNOSIS — Z1331 Encounter for screening for depression: Secondary | ICD-10-CM | POA: Diagnosis not present

## 2022-02-01 DIAGNOSIS — M545 Low back pain, unspecified: Secondary | ICD-10-CM | POA: Diagnosis not present

## 2022-02-02 ENCOUNTER — Ambulatory Visit: Payer: Medicaid Other | Admitting: Physical Therapy

## 2022-02-02 DIAGNOSIS — M6281 Muscle weakness (generalized): Secondary | ICD-10-CM | POA: Diagnosis not present

## 2022-02-02 DIAGNOSIS — R2689 Other abnormalities of gait and mobility: Secondary | ICD-10-CM

## 2022-02-03 NOTE — Therapy (Signed)
OUTPATIENT PHYSICAL THERAPY TREATMENT NOTE   Patient Name: Kirsten Walsh MRN: [id number] DOB:Jan 11, 1961, 61 y.o., female 67 Date: 02/03/2022  PCP: Wells Guiles, DO  REFERRING PROVIDER: Hosie Poisson, MD     END OF SESSION:   PT End of Session - 02/03/22 1159     Visit Number 8    Number of Visits 13    Date for PT Re-Evaluation 02/10/22    Authorization Type [place] medicaid    PT Start Time 1018    PT Stop Time 1100    PT Time Calculation (min) 42 min    Equipment Utilized During Treatment Other (comment)   pool noodles, single bar bells   Activity Tolerance Patient tolerated treatment well;No increased pain    Behavior During Therapy WFL for tasks assessed/performed               Past Medical History:  Diagnosis Date   Arthritis    Chest pain    Chronic lower back pain    "right side to mid back" (07/06/2017)   Family history of adverse reaction to anesthesia    "daughter did; not sure happened"   Gall stones    History of anaphylaxis 06/26/2014   History of kidney stones    Hypertension    Migraine    "qd" (07/06/2017)   Syncope and collapse 07/06/2017    sitting on the commode; developed nausea and passed out; hit head on shower and suffered a laceration; woke up in "a pool of blood"   Past Surgical History:  Procedure Laterality Date   CHONDROPLASTY Left 08/17/2019   Procedure: CHONDROPLASTY;  Surgeon: Earlie Server, MD;  Location: [facility];  Service: Orthopedics;  Laterality: Left;   CYSTOSCOPY W/ STONE MANIPULATION  ~ 2006   INTRAUTERINE DEVICE INSERTION     "initially put in in 04/2002; changed prn" (02/14/2013)   KNEE ARTHROSCOPY WITH LATERAL MENISECTOMY  08/17/2019   Procedure: KNEE ARTHROSCOPY WITH LATERAL MENISECTOMY;  Surgeon: Earlie Server, MD;  Location: [facility];  Service: Orthopedics;;   KNEE ARTHROSCOPY WITH MEDIAL MENISECTOMY Left 08/17/2019   Procedure: KNEE ARTHROSCOPY WITH MEDIAL MENISECTOMY;   Surgeon: Earlie Server, MD;  Location: [facility];  Service: Orthopedics;  Laterality: Left;   LAPAROSCOPIC CHOLECYSTECTOMY  ~ 2004   TOTAL KNEE ARTHROPLASTY Left 01/16/2021   Procedure: TOTAL KNEE ARTHROPLASTY;  Surgeon: Netta Cedars, MD;  Location[facility];  Service: Orthopedics;  Laterality: Left;  with adductor canal   Patient Active Problem List   Diagnosis Date Noted   Seizures ([place]) 12/17/2021   Seizure-like activity ([place]) 12/15/2021   S/P TKR (total knee replacement), left 01/18/2021   H/O total knee replacement, left 01/16/2021   Cervical cancer screening 03/09/2020   Encounter for administration of vaccine 03/09/2020   Preoperative clearance 07/24/2019   Right arm pain 06/12/2019   Syncope 07/06/2017   Chronic daily headache 09/02/2015   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   Syncopal episodes 09/02/2015   Right arm weakness 08/05/2015   Edema of right arm and right leg 09/07/2014   Intractable migraine with status migrainosus 06/10/2014   Chronic migraine without aura, intractable, with status migrainosus    Bradycardia 02/18/2013   Left knee pain 01/16/2013   Cardiomyopathy 10/20/2011   Leg swelling 09/23/2011   Complex regional pain syndrome of right lower extremity 05/03/2011   Chronic pain 03/29/2011   NECK PAIN, RIGHT 02/03/2010   ALLERGIC RHINITIS 01/30/2009   DIZZINESS 07/16/2008   Nonintractable chronic migraine  04/26/2007   OBESITY, NOS 08/18/2006   HYPERTENSION, BENIGN SYSTEMIC 08/18/2006    REFERRING DIAG: R56.9 (ICD-10-CM) - Seizure ([facility])   THERAPY DIAG:  Muscle weakness (generalized)  Other abnormalities of gait and mobility  Rationale for Evaluation and Treatment Rehabilitation  PERTINENT HISTORY: chronic migraine, chronic back pain, hypertension, and mild renal insufficiency   PRECAUTIONS: Fall and Other: seizure?   SUBJECTIVE: Had a good weekend. No new complaints.   PAIN:  Are you having pain? Yes NPRS scale: less than  moderate Pain location: low back Pain orientation: Lower right side PAIN TYPE: chronic Pain description:  stabbing at times, "in and out, it goes downward"   Aggravating factors: all movements Relieving factors: pain med's at times help  PAIN:  Are you having pain? Yes NPRS scale:mild, less than a moderate level Pain location: headache Pain orientation: Other: head   PAIN TYPE: chronic Pain description: constant, dull, stabbing, aching, and varies in intensity   Aggravating factors: sudden temperature changes and severe heat or cold Relieving factors: pain med's at times help, had some sleep/nap today  TODAY'S TREATMENT:  Aquatic therapy at [facility] - pool temp 90 degrees  Patient seen for aquatic therapy today.  Treatment took place in water 3.5-4.5 feet deep depending upon activity.  Pt entered/exited pool via stairs with bil rails, supervision for safety.   Use of yellow pool noodle for gait into deeper end of pool, ~4.3 to 4.5 foot depth.    Strattled over yellow noodle with single bar bells        Pt requires buoyancy of water for support for reduced fall risk with gait training and balance exercises with minimal UE support; exercises able to be performed safely in water without the risk of fall compared to those same exercises performed on land;  viscosity of water needed for resistance for strengthening.  Current of water provides perturbations for challenging static & dynamic standing balance.              PATIENT EDUCATION: Education details: continue with current HEP, aquatic ex's to perform on own at [facility] educated: Patient Education method: Customer service manager Education comprehension: verbalized understanding     HOME EXERCISE PROGRAM: Access Code: [email or web address] URL: [email or web address] Date: 01/05/2022 Prepared by: Willow Ora  Exercises - Supine Transversus Abdominis Bracing - Hands on Ground  - 1 x  daily - 5 x weekly - 1 sets - 10 reps - 5 seconds hold - Supine Posterior Pelvic Tilt  - 1 x daily - 5 x weekly - 1 sets - 10 reps - Bent Knee Fallouts with Alternating Legs  - 1 x daily - 5 x weekly - 1 sets - 10 reps - Lower Trunk Rotations  - 1 x daily - 5 x weekly - 1 sets - 5 reps       GOALS: Goals reviewed with patient? Yes   SHORT TERM GOALS: Target date: 01/20/2022   Pt will be independent with initial HEP for improved balance and functional strength  Baseline:  01/29/22: met with current program Goal status: MET   2.  Pt will improve FGA to >/= 23/30 to demonstrate improved balance and reduced fall risk Baseline: 01/29/22: 25/30 scored today Goal status: MET   3.  Pt will improve gait speed to >/= .64M/S  to demonstrate improved community ambulation Baseline: 01/29/22: 0.77 M/S Goal status: MET   4.  Patient will complete oswestry and LTG to updated as appropriate Baseline: 22/50 (moderate disability)  Goal status: MET     LONG TERM GOALS: Target date: 02/10/2022   Pt will be independent with final HEP for improved balance and functional strength  Baseline: to be provided Goal status: INITIAL   2.  Pt will improve FGA to >/= 28/30 to demonstrate improved balance and reduced fall risk Baseline: 18/30 Goal status: INITIAL   3.  Pt will improve gait speed to >/= .60m/s  to demonstrate improved community ambulation Baseline: 01/29/22: 0.77 M/S Goal status: MET  4. Pt will improve Oswestry Low Back Disability Questionnaire to </= 12/50 for improved subjective functional ability   Baseline: 22/50 (moderate disability)   Goal Status: NEW      ASSESSMENT:   CLINICAL IMPRESSION: Today's skilled session   OBJECTIVE IMPAIRMENTS Abnormal gait, decreased activity tolerance, decreased balance, decreased endurance, decreased knowledge of condition, decreased mobility, difficulty walking, decreased strength, dizziness, hypomobility, improper body mechanics, and pain.     ACTIVITY LIMITATIONS carrying, lifting, bending, sitting, standing, squatting, sleeping, stairs, transfers, bed mobility, reach over head, hygiene/grooming, locomotion level, and caring for others   PARTICIPATION LIMITATIONS: meal prep, cleaning, laundry, interpersonal relationship, driving, shopping, community activity, occupation, yard work, and school   PERSONAL FACTORS Behavior pattern, Fitness, Past/current experiences, Social background, Time since onset of injury/illness/exacerbation, and 1-2 comorbidities: intractable migraines, HTN  are also affecting patient's functional outcome.    REHAB POTENTIAL: Fair time since onset   CLINICAL DECISION MAKING: Unstable/unpredictable   EVALUATION COMPLEXITY: High   PLAN: PT FREQUENCY: 2x/week   PT DURATION: 6 weeks   PLANNED INTERVENTIONS: Therapeutic exercises, Therapeutic activity, Neuromuscular re-education, Balance training, Gait training, Patient/Family education, Self Care, Joint mobilization, Stair training, Vestibular training, Canalith repositioning, Visual/preceptual remediation/compensation, Orthotic/Fit training, DME instructions, Aquatic Therapy, Dry Needling, Cognitive remediation, Spinal mobilization, Cryotherapy, Moist heat, Taping, Manual therapy, and Re-evaluation   PLAN FOR NEXT SESSION:  Continue with gentle strengthening/stretching, pain reduction methods as able, body mechanics education, core stabilization, thoracic mobility, aquatic session to establish pool HEP to perform at her gym pool    Willow Ora, [place], [facility] [address], [place], [place] [address] [phone] 02/03/22, 12:00 PM

## 2022-02-04 ENCOUNTER — Ambulatory Visit: Payer: Medicaid Other | Admitting: Physical Therapy

## 2022-02-04 ENCOUNTER — Encounter: Payer: Self-pay | Admitting: Physical Therapy

## 2022-02-04 DIAGNOSIS — R2689 Other abnormalities of gait and mobility: Secondary | ICD-10-CM

## 2022-02-04 DIAGNOSIS — Z79899 Other long term (current) drug therapy: Secondary | ICD-10-CM | POA: Diagnosis not present

## 2022-02-04 DIAGNOSIS — M6281 Muscle weakness (generalized): Secondary | ICD-10-CM

## 2022-02-04 NOTE — Therapy (Signed)
OUTPATIENT PHYSICAL THERAPY TREATMENT NOTE   Patient Name: Kirsten Walsh MRN: [id number] DOB:1960-10-21, 61 y.o., female Today's Date: 02/05/2022  PCP: Wells Guiles, DO  REFERRING PROVIDER: Hosie Poisson, MD     END OF SESSION:   PT End of Session - 02/04/22 1537     Visit Number 9    Number of Visits 13    Date for PT Re-Evaluation 02/10/22    Authorization Type [place] medicaid    PT Start Time 1536    PT Stop Time 7672    PT Time Calculation (min) 39 min    Equipment Utilized During Treatment --    Activity Tolerance Patient tolerated treatment well;Patient limited by pain    Behavior During Therapy [facility] for tasks assessed/performed               Past Medical History:  Diagnosis Date   Arthritis    Chest pain    Chronic lower back pain    "right side to mid back" (07/06/2017)   Family history of adverse reaction to anesthesia    "daughter did; not sure happened"   Gall stones    History of anaphylaxis 06/26/2014   History of kidney stones    Hypertension    Migraine    "qd" (07/06/2017)   Syncope and collapse 07/06/2017    sitting on the commode; developed nausea and passed out; hit head on shower and suffered a laceration; woke up in "a pool of blood"   Past Surgical History:  Procedure Laterality Date   CHONDROPLASTY Left 08/17/2019   Procedure: CHONDROPLASTY;  Surgeon: Earlie Server, MD;  Location: [place];  Service: Orthopedics;  Laterality: Left;   CYSTOSCOPY W/ STONE MANIPULATION  ~ 2006   INTRAUTERINE DEVICE INSERTION     "initially put in in 04/2002; changed prn" (02/14/2013)   KNEE ARTHROSCOPY WITH LATERAL MENISECTOMY  08/17/2019   Procedure: KNEE ARTHROSCOPY WITH LATERAL MENISECTOMY;  Surgeon: Earlie Server, MD;  Location: [place];  Service: Orthopedics;;   KNEE ARTHROSCOPY WITH MEDIAL MENISECTOMY Left 08/17/2019   Procedure: KNEE ARTHROSCOPY WITH MEDIAL MENISECTOMY;  Surgeon: Earlie Server, MD;  Location:  [place];  Service: Orthopedics;  Laterality: Left;   LAPAROSCOPIC CHOLECYSTECTOMY  ~ 2004   TOTAL KNEE ARTHROPLASTY Left 01/16/2021   Procedure: TOTAL KNEE ARTHROPLASTY;  Surgeon: Netta Cedars, MD;  Location: [facility];  Service: Orthopedics;  Laterality: Left;  with adductor canal   Patient Active Problem List   Diagnosis Date Noted   Seizures ([place]) 12/17/2021   Seizure-like activity ([place]) 12/15/2021   S/P TKR (total knee replacement), left 01/18/2021   H/O total knee replacement, left 01/16/2021   Cervical cancer screening 03/09/2020   Encounter for administration of vaccine 03/09/2020   Preoperative clearance 07/24/2019   Right arm pain 06/12/2019   Syncope 07/06/2017   Chronic daily headache 09/02/2015   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   Syncopal episodes 09/02/2015   Right arm weakness 08/05/2015   Edema of right arm and right leg 09/07/2014   Intractable migraine with status migrainosus 06/10/2014   Chronic migraine without aura, intractable, with status migrainosus    Bradycardia 02/18/2013   Left knee pain 01/16/2013   Cardiomyopathy 10/20/2011   Leg swelling 09/23/2011   Complex regional pain syndrome of right lower extremity 05/03/2011   Chronic pain 03/29/2011   NECK PAIN, RIGHT 02/03/2010   ALLERGIC RHINITIS 01/30/2009   DIZZINESS 07/16/2008   Nonintractable chronic migraine 04/26/2007   OBESITY, NOS 08/18/2006  HYPERTENSION, BENIGN SYSTEMIC 08/18/2006    REFERRING DIAG: R56.9 (ICD-10-CM) - Seizure ([place])   THERAPY DIAG:  Muscle weakness (generalized)  Other abnormalities of gait and mobility  Rationale for Evaluation and Treatment Rehabilitation  PERTINENT HISTORY: chronic migraine, chronic back pain, hypertension, and mild renal insufficiency   PRECAUTIONS: Fall and Other: seizure?   SUBJECTIVE: H No new complaints. Felt good after the pool this week.   PAIN:  Are you having pain? Yes NPRS scale: mild Pain location: low back Pain  orientation: Lower right side and some on left side from fall PAIN TYPE: chronic Pain description:  stabbing at times, "in and out, it goes downward"   Aggravating factors: all movements Relieving factors: pain med's at times help  PAIN:  Are you having pain? Yes NPRS scale: moderate Pain location: headache Pain orientation: Other: head   PAIN TYPE: chronic Pain description: constant, dull, stabbing, aching, and varies in intensity   Aggravating factors: sudden temperature changes and severe heat or cold Relieving factors: pain med's at times help, had some sleep/nap today  TODAY'S TREATMENT:  STRENGTHENING  Scifit UE/LE's level 2.0 x 6 minutes with goal >/= 50 steps per minute concurrent with moist hot pack to mid>lower back  Seated on large pball: Pelvic rocks left<>right, then anterior<>posterior for 10 reps each Pelvic circles in small range x 10 rep each way With tall posture- heel<>toe raises x 15 reps, then LAQs x 6 reps each side before pt had lower left back spasm. Unable to relieve spasm with stretching, therefore came off ball and back to chair.  Seated in chair: With red band resistance: scapular retraction x 10 reps, then shoulder horizontal abduction x 10 reps. Cues on posture and ex form/technique.  On mat table: in quadruped working on anterior/posterior weight shifting to stretch lower back and then upper back/shoulders. Then had pt go into modified childs pose with pillows under buttocks due to limited left knee range of motion. Pt to work on this at home as able when knee swelling is not as bad.       PATIENT EDUCATION: Education details: continue with current HEP, plan to discharge next week at next land session Person educated: Patient Education method: Explanation and Demonstration Education comprehension: verbalized understanding     HOME EXERCISE PROGRAM: Access Code: [email or web address] URL: [email or web address] Date: 01/05/2022 Prepared by:  Willow Ora  Exercises - Supine Transversus Abdominis Bracing - Hands on Ground  - 1 x daily - 5 x weekly - 1 sets - 10 reps - 5 seconds hold - Supine Posterior Pelvic Tilt  - 1 x daily - 5 x weekly - 1 sets - 10 reps - Bent Knee Fallouts with Alternating Legs  - 1 x daily - 5 x weekly - 1 sets - 10 reps - Lower Trunk Rotations  - 1 x daily - 5 x weekly - 1 sets - 5 reps       GOALS: Goals reviewed with patient? Yes   SHORT TERM GOALS: Target date: 01/20/2022   Pt will be independent with initial HEP for improved balance and functional strength  Baseline:  01/29/22: met with current program Goal status: MET   2.  Pt will improve FGA to >/= 23/30 to demonstrate improved balance and reduced fall risk Baseline: 01/29/22: 25/30 scored today Goal status: MET   3.  Pt will improve gait speed to >/= .29M/S  to demonstrate improved community ambulation Baseline: 01/29/22: 0.77 M/S Goal status: MET   4.  Patient  will complete oswestry and LTG to updated as appropriate Baseline: 22/50 (moderate disability)  Goal status: MET     LONG TERM GOALS: Target date: 02/10/2022   Pt will be independent with final HEP for improved balance and functional strength  Baseline: to be provided Goal status: INITIAL   2.  Pt will improve FGA to >/= 28/30 to demonstrate improved balance and reduced fall risk Baseline: 18/30 Goal status: INITIAL   3.  Pt will improve gait speed to >/= .91m/s  to demonstrate improved community ambulation Baseline: 01/29/22: 0.77 M/S Goal status: MET  4. Pt will improve Oswestry Low Back Disability Questionnaire to </= 12/50 for improved subjective functional ability   Baseline: 22/50 (moderate disability)   Goal Status: NEW      ASSESSMENT:   CLINICAL IMPRESSION: Today's skilled session continued to focus on core/LE strengthening. Pt with increased spasm on her left lower back while on pball. This improved with change of activity to supported surface of chair.     OBJECTIVE IMPAIRMENTS Abnormal gait, decreased activity tolerance, decreased balance, decreased endurance, decreased knowledge of condition, decreased mobility, difficulty walking, decreased strength, dizziness, hypomobility, improper body mechanics, and pain.    ACTIVITY LIMITATIONS carrying, lifting, bending, sitting, standing, squatting, sleeping, stairs, transfers, bed mobility, reach over head, hygiene/grooming, locomotion level, and caring for others   PARTICIPATION LIMITATIONS: meal prep, cleaning, laundry, interpersonal relationship, driving, shopping, community activity, occupation, yard work, and school   PERSONAL FACTORS Behavior pattern, Fitness, Past/current experiences, Social background, Time since onset of injury/illness/exacerbation, and 1-2 comorbidities: intractable migraines, HTN  are also affecting patient's functional outcome.    REHAB POTENTIAL: Fair time since onset   CLINICAL DECISION MAKING: Unstable/unpredictable   EVALUATION COMPLEXITY: High   PLAN: PT FREQUENCY: 2x/week   PT DURATION: 6 weeks   PLANNED INTERVENTIONS: Therapeutic exercises, Therapeutic activity, Neuromuscular re-education, Balance training, Gait training, Patient/Family education, Self Care, Joint mobilization, Stair training, Vestibular training, Canalith repositioning, Visual/preceptual remediation/compensation, Orthotic/Fit training, DME instructions, Aquatic Therapy, Dry Needling, Cognitive remediation, Spinal mobilization, Cryotherapy, Moist heat, Taping, Manual therapy, and Re-evaluation   PLAN FOR NEXT SESSION:  One more aquatic session to finalize home pool program, then check LTGs at next land session for anticipated discharge    Willow Ora, PTA, [facility] [address], [place], [place] [address] [phone] 02/05/22, 11:41 AM

## 2022-02-08 ENCOUNTER — Encounter: Payer: Self-pay | Admitting: Physical Therapy

## 2022-02-08 ENCOUNTER — Ambulatory Visit: Payer: Medicaid Other | Admitting: Physical Therapy

## 2022-02-08 DIAGNOSIS — M6281 Muscle weakness (generalized): Secondary | ICD-10-CM

## 2022-02-08 DIAGNOSIS — R2689 Other abnormalities of gait and mobility: Secondary | ICD-10-CM

## 2022-02-08 NOTE — Therapy (Addendum)
OUTPATIENT PHYSICAL THERAPY TREATMENT NOTE   Patient Name: Kirsten Walsh MRN: [id number] DOB:11/20/1960, 61 y.o., female Today's Date: 02/08/2022  PCP: [name] Mattocks, DO  REFERRING PROVIDER: Kathlen Mody, MD     END OF SESSION:   PT End of Session - 02/08/22 1114     Visit Number 10    Number of Visits 13    Date for PT Re-Evaluation 02/10/22    Authorization Type [place] medicaid    PT Start Time 1015    PT Stop Time 1058    PT Time Calculation (min) 43 min    Activity Tolerance Patient tolerated treatment well;Patient limited by pain    Behavior During Therapy [facility] for tasks assessed/performed               Past Medical History:  Diagnosis Date   Arthritis    Chest pain    Chronic lower back pain    "right side to mid back" (07/06/2017)   Family history of adverse reaction to anesthesia    "daughter did; not sure happened"   Gall stones    History of anaphylaxis 06/26/2014   History of kidney stones    Hypertension    Migraine    "qd" (07/06/2017)   Syncope and collapse 07/06/2017    sitting on the commode; developed nausea and passed out; hit head on shower and suffered a laceration; woke up in "a pool of blood"   Past Surgical History:  Procedure Laterality Date   CHONDROPLASTY Left 08/17/2019   Procedure: CHONDROPLASTY;  Surgeon: Frederico Hamman, MD;  Location: [facility];  Service: Orthopedics;  Laterality: Left;   CYSTOSCOPY W/ STONE MANIPULATION  ~ 2006   INTRAUTERINE DEVICE INSERTION     "initially put in in 04/2002; changed prn" (02/14/2013)   KNEE ARTHROSCOPY WITH LATERAL MENISECTOMY  08/17/2019   Procedure: KNEE ARTHROSCOPY WITH LATERAL MENISECTOMY;  Surgeon: Frederico Hamman, MD;  Location: [facility];  Service: Orthopedics;;   KNEE ARTHROSCOPY WITH MEDIAL MENISECTOMY Left 08/17/2019   Procedure: KNEE ARTHROSCOPY WITH MEDIAL MENISECTOMY;  Surgeon: Frederico Hamman, MD;  Location: [facility];  Service:  Orthopedics;  Laterality: Left;   LAPAROSCOPIC CHOLECYSTECTOMY  ~ 2004   TOTAL KNEE ARTHROPLASTY Left 01/16/2021   Procedure: TOTAL KNEE ARTHROPLASTY;  Surgeon: Beverely Low, MD;  Location[facility];  Service: Orthopedics;  Laterality: Left;  with adductor canal   Patient Active Problem List   Diagnosis Date Noted   Seizures ([facility]) 12/17/2021   Seizure-like activity ([facility]) 12/15/2021   S/P TKR (total knee replacement), left 01/18/2021   H/O total knee replacement, left 01/16/2021   Cervical cancer screening 03/09/2020   Encounter for administration of vaccine 03/09/2020   Preoperative clearance 07/24/2019   Right arm pain 06/12/2019   Syncope 07/06/2017   Chronic daily headache 09/02/2015   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   Syncopal episodes 09/02/2015   Right arm weakness 08/05/2015   Edema of right arm and right leg 09/07/2014   Intractable migraine with status migrainosus 06/10/2014   Chronic migraine without aura, intractable, with status migrainosus    Bradycardia 02/18/2013   Left knee pain 01/16/2013   Cardiomyopathy 10/20/2011   Leg swelling 09/23/2011   Complex regional pain syndrome of right lower extremity 05/03/2011   Chronic pain 03/29/2011   NECK PAIN, RIGHT 02/03/2010   ALLERGIC RHINITIS 01/30/2009   DIZZINESS 07/16/2008   Nonintractable chronic migraine 04/26/2007   OBESITY, NOS 08/18/2006   HYPERTENSION, BENIGN SYSTEMIC 08/18/2006  REFERRING DIAG: R56.9 (ICD-10-CM) - Seizure ([facility])   THERAPY DIAG:  Muscle weakness (generalized)  Other abnormalities of gait and mobility  Rationale for Evaluation and Treatment Rehabilitation  PERTINENT HISTORY: chronic migraine, chronic back pain, hypertension, and mild renal insufficiency   PRECAUTIONS: Fall and Other: seizure?   SUBJECTIVE: H No new complaints. Felt good after the pool this week.   PAIN:  Are you having pain? Yes NPRS scale: mild Pain location: low back Pain orientation: Lower right side and some on  left side from fall PAIN TYPE: chronic Pain description:  stabbing at times, "in and out, it goes downward"   Aggravating factors: all movements Relieving factors: pain med's at times help  PAIN:  Are you having pain? Yes NPRS scale: moderate Pain location: headache Pain orientation: Other: head   PAIN TYPE: chronic Pain description: constant, dull, stabbing, aching, and varies in intensity   Aggravating factors: sudden temperature changes and severe heat or cold Relieving factors: pain med's at times help, had some sleep/nap today  TODAY'S TREATMENT:  Aquatic therapy at [facility] - pool temp 90 degrees  Patient seen for aquatic therapy today.  Treatment took place in water 3.5-4.5 feet deep depending upon activity.  Pt entered/exited pool via stairs with bil rails, supervision for safety   Use of yellow pool noodle for gait into deeper end of pool, ~4.3 to 4.5 foot depth. 18 feet across pool with each of these. Forward x 8 laps Backward x 8 laps Side stepping left<>right x 4 laps each way     Strattled over yellow noodle in 4.5-4.8 foot water depth with single bar bells working on bicycling around deeper end with arms movement x 5 minutes; then using arms for balance for LE scissor kicks and flutter kicks x 10 reps each.    Strattled over yellow noodle in 4.5-4.8 foot water depth no bar bells with emphasis on just "relaxing" to allow for gravity eliminated floating x 5 minutes.     Pt requires buoyancy of water for support for reduced fall risk with gait training and balance exercises with minimal UE support; exercises able to be performed safely in water without the risk of fall compared to those same exercises performed on land;  viscosity of water needed for resistance for strengthening.  Current of water provides perturbations for challenging static & dynamic standing balance.         PATIENT EDUCATION: Education details: continue with current HEP, plan to discharge  next week at next land session Person educated: Patient Education method: Explanation and Demonstration Education comprehension: verbalized understanding     HOME EXERCISE PROGRAM: Access Code: [email or web address] URL: [email or web address] Date: 01/05/2022 Prepared by: Sallyanne Kuster  Exercises - Supine Transversus Abdominis Bracing - Hands on Ground  - 1 x daily - 5 x weekly - 1 sets - 10 reps - 5 seconds hold - Supine Posterior Pelvic Tilt  - 1 x daily - 5 x weekly - 1 sets - 10 reps - Bent Knee Fallouts with Alternating Legs  - 1 x daily - 5 x weekly - 1 sets - 10 reps - Lower Trunk Rotations  - 1 x daily - 5 x weekly - 1 sets - 5 reps       GOALS: Goals reviewed with patient? Yes   SHORT TERM GOALS: Target date: 01/20/2022   Pt will be independent with initial HEP for improved balance and functional strength  Baseline:  01/29/22: met with current program Goal status: MET  2.  Pt will improve FGA to >/= 23/30 to demonstrate improved balance and reduced fall risk Baseline: 01/29/22: 25/30 scored today Goal status: MET   3.  Pt will improve gait speed to >/= .6M/S  to demonstrate improved community ambulation Baseline: 01/29/22: 0.77 M/S Goal status: MET   4.  Patient will complete oswestry and LTG to updated as appropriate Baseline: 22/50 (moderate disability)  Goal status: MET     LONG TERM GOALS: Target date: 02/10/2022   Pt will be independent with final HEP for improved balance and functional strength  Baseline: to be provided Goal status: INITIAL   2.  Pt will improve FGA to >/= 28/30 to demonstrate improved balance and reduced fall risk Baseline: 18/30 Goal status: INITIAL   3.  Pt will improve gait speed to >/= .36m/s  to demonstrate improved community ambulation Baseline: 01/29/22: 0.77 M/S Goal status: MET  4. Pt will improve Oswestry Low Back Disability Questionnaire to </= 12/50 for improved subjective functional ability   Baseline: 22/50 (moderate  disability)   Goal Status: NEW      ASSESSMENT:   CLINICAL IMPRESSION: Today's skilled session continued to focus on gait and strengthening in the aquatic setting. Pt continues to report left sided soreness/pain from fall. Reinforced calling MD to have area further assessed. Pt reported planning to call today. Focused on mostly weightless activities today after gait with noodle in various directions. No change in pain reported after session.    OBJECTIVE IMPAIRMENTS Abnormal gait, decreased activity tolerance, decreased balance, decreased endurance, decreased knowledge of condition, decreased mobility, difficulty walking, decreased strength, dizziness, hypomobility, improper body mechanics, and pain.    ACTIVITY LIMITATIONS carrying, lifting, bending, sitting, standing, squatting, sleeping, stairs, transfers, bed mobility, reach over head, hygiene/grooming, locomotion level, and caring for others   PARTICIPATION LIMITATIONS: meal prep, cleaning, laundry, interpersonal relationship, driving, shopping, community activity, occupation, yard work, and school   PERSONAL FACTORS Behavior pattern, Fitness, Past/current experiences, Social background, Time since onset of injury/illness/exacerbation, and 1-2 comorbidities: intractable migraines, HTN  are also affecting patient's functional outcome.    REHAB POTENTIAL: Fair time since onset   CLINICAL DECISION MAKING: Unstable/unpredictable   EVALUATION COMPLEXITY: High   PLAN: PT FREQUENCY: 2x/week   PT DURATION: 6 weeks   PLANNED INTERVENTIONS: Therapeutic exercises, Therapeutic activity, Neuromuscular re-education, Balance training, Gait training, Patient/Family education, Self Care, Joint mobilization, Stair training, Vestibular training, Canalith repositioning, Visual/preceptual remediation/compensation, Orthotic/Fit training, DME instructions, Aquatic Therapy, Dry Needling, Cognitive remediation, Spinal mobilization, Cryotherapy, Moist heat,  Taping, Manual therapy, and Re-evaluation   PLAN FOR NEXT SESSION:  check LTGs at next land session for anticipated discharge    Willow Ora, PTA, [facility] [address], [place], [place] [address] [phone] 02/08/22, 11:14 AM

## 2022-02-09 ENCOUNTER — Ambulatory Visit: Payer: Medicaid Other

## 2022-02-10 ENCOUNTER — Ambulatory Visit: Payer: Medicaid Other | Admitting: Physical Therapy

## 2022-02-11 ENCOUNTER — Ambulatory Visit: Payer: Medicaid Other | Admitting: Physical Therapy

## 2022-02-12 ENCOUNTER — Emergency Department (HOSPITAL_COMMUNITY): Payer: Medicaid Other

## 2022-02-12 ENCOUNTER — Encounter (HOSPITAL_COMMUNITY): Payer: Self-pay | Admitting: Emergency Medicine

## 2022-02-12 ENCOUNTER — Other Ambulatory Visit: Payer: Self-pay

## 2022-02-12 ENCOUNTER — Emergency Department (HOSPITAL_COMMUNITY)
Admission: EM | Admit: 2022-02-12 | Discharge: 2022-02-13 | Disposition: A | Discharge status: Home / Self Care | Payer: Medicaid Other | Attending: Emergency Medicine | Admitting: Emergency Medicine

## 2022-02-12 ENCOUNTER — Ambulatory Visit: Payer: Medicaid Other | Admitting: Physical Therapy

## 2022-02-12 DIAGNOSIS — Z79899 Other long term (current) drug therapy: Secondary | ICD-10-CM | POA: Diagnosis not present

## 2022-02-12 DIAGNOSIS — M25562 Pain in left knee: Secondary | ICD-10-CM | POA: Insufficient documentation

## 2022-02-12 DIAGNOSIS — R569 Unspecified convulsions: Secondary | ICD-10-CM | POA: Diagnosis not present

## 2022-02-12 DIAGNOSIS — G43009 Migraine without aura, not intractable, without status migrainosus: Secondary | ICD-10-CM | POA: Insufficient documentation

## 2022-02-12 DIAGNOSIS — I10 Essential (primary) hypertension: Secondary | ICD-10-CM | POA: Diagnosis not present

## 2022-02-12 DIAGNOSIS — R55 Syncope and collapse: Secondary | ICD-10-CM

## 2022-02-12 DIAGNOSIS — Z743 Need for continuous supervision: Secondary | ICD-10-CM | POA: Diagnosis not present

## 2022-02-12 DIAGNOSIS — G4489 Other headache syndrome: Secondary | ICD-10-CM | POA: Diagnosis not present

## 2022-02-12 DIAGNOSIS — G43909 Migraine, unspecified, not intractable, without status migrainosus: Secondary | ICD-10-CM

## 2022-02-12 LAB — CBC WITH DIFFERENTIAL/PLATELET
Abs Immature Granulocytes: 0.01 10*3/uL (ref 0.00–0.07)
Basophils Absolute: 0 10*3/uL (ref 0.0–0.1)
Basophils Relative: 1 %
Eosinophils Absolute: 0.2 10*3/uL (ref 0.0–0.5)
Eosinophils Relative: 5 %
HCT: 38.8 % (ref 36.0–46.0)
Hemoglobin: 12.7 g/dL (ref 12.0–15.0)
Immature Granulocytes: 0 %
Lymphocytes Relative: 41 %
Lymphs Abs: 1.6 10*3/uL (ref 0.7–4.0)
MCH: 30.2 pg (ref 26.0–34.0)
MCHC: 32.7 g/dL (ref 30.0–36.0)
MCV: 92.4 fL (ref 80.0–100.0)
Monocytes Absolute: 0.4 10*3/uL (ref 0.1–1.0)
Monocytes Relative: 10 %
Neutro Abs: 1.7 10*3/uL (ref 1.7–7.7)
Neutrophils Relative %: 43 %
Platelets: 210 10*3/uL (ref 150–400)
RBC: 4.2 MIL/uL (ref 3.87–5.11)
RDW: 13.5 % (ref 11.5–15.5)
WBC: 3.9 10*3/uL — ABNORMAL LOW (ref 4.0–10.5)
nRBC: 0 % (ref 0.0–0.2)

## 2022-02-12 LAB — COMPREHENSIVE METABOLIC PANEL
ALT: 20 U/L (ref 0–44)
AST: 31 U/L (ref 15–41)
Albumin: 3.5 g/dL (ref 3.5–5.0)
Alkaline Phosphatase: 72 U/L (ref 38–126)
Anion gap: 8 (ref 5–15)
BUN: 7 mg/dL (ref 6–20)
CO2: 30 mmol/L (ref 22–32)
Calcium: 8.9 mg/dL (ref 8.9–10.3)
Chloride: 102 mmol/L (ref 98–111)
Creatinine, Ser: 1.12 mg/dL — ABNORMAL HIGH (ref 0.44–1.00)
GFR, Estimated: 57 mL/min — ABNORMAL LOW (ref >60)
Glucose, Bld: 81 mg/dL (ref 70–99)
Potassium: 3.4 mmol/L — ABNORMAL LOW (ref 3.5–5.1)
Sodium: 140 mmol/L (ref 135–145)
Total Bilirubin: 0.6 mg/dL (ref 0.3–1.2)
Total Protein: 6.2 g/dL — ABNORMAL LOW (ref 6.5–8.1)

## 2022-02-12 LAB — MAGNESIUM: Magnesium: 1.8 mg/dL (ref 1.7–2.4)

## 2022-02-12 MED ORDER — PROCHLORPERAZINE EDISYLATE 10 MG/2ML IJ SOLN
5.0000 mg | Freq: Once | INTRAMUSCULAR | Status: AC
  Administered 2022-02-12: 5 mg via INTRAVENOUS
  Filled 2022-02-12: qty 2

## 2022-02-12 MED ORDER — ACETAMINOPHEN 500 MG PO TABS
1000.0000 mg | ORAL_TABLET | Freq: Four times a day (QID) | ORAL | Status: DC | PRN
  Administered 2022-02-12: 1000 mg via ORAL
  Filled 2022-02-12: qty 2

## 2022-02-12 MED ORDER — LACTATED RINGERS IV BOLUS
1000.0000 mL | Freq: Once | INTRAVENOUS | Status: AC
  Administered 2022-02-12: 1000 mL via INTRAVENOUS

## 2022-02-12 MED ORDER — DIPHENHYDRAMINE HCL 50 MG/ML IJ SOLN
12.5000 mg | Freq: Once | INTRAMUSCULAR | Status: AC
  Administered 2022-02-12: 12.5 mg via INTRAVENOUS
  Filled 2022-02-12: qty 1

## 2022-02-12 MED ORDER — DEXAMETHASONE SODIUM PHOSPHATE 10 MG/ML IJ SOLN
10.0000 mg | Freq: Once | INTRAMUSCULAR | Status: AC
  Administered 2022-02-12: 10 mg via INTRAVENOUS
  Filled 2022-02-12: qty 1

## 2022-02-12 MED ORDER — MAGNESIUM SULFATE 2 GM/50ML IV SOLN
2.0000 g | Freq: Once | INTRAVENOUS | Status: AC
Start: 2022-02-12 — End: 2022-02-12
  Administered 2022-02-12: 2 g via INTRAVENOUS
  Filled 2022-02-12: qty 50

## 2022-02-12 NOTE — Discharge Instructions (Signed)
Thank you for coming to [facility] health emergency department.  You were seen for headache associated with loss of consciousness.  You evaluated and had labs as well as imaging of your head and neck which were negative for any acute pathology.  You were treated with several medications for headache via your IV and had an improvement in your symptoms.  Please do not drive and continue to take your antiseizure medication.  Please follow-up with your neurologist as soon as possible and call them on Monday morning to make an appointment.  If you cannot follow-up with your neurologist this week, please follow-up with your primary care doctor this week.  Do not hesitate to call 911 or return to the emergency department if you experience: New or worsening headache Loss of consciousness Palpitations, chest pain, shortness of breath Seizure activity Confusion or altered mental status

## 2022-02-12 NOTE — ED Triage Notes (Signed)
Pt BIB [facility] from work due to syncopal episode.  Pt was found against plaster wall; unwitnessed.  Pt states she came in with a migraine.  Hx of same.  Pt reports being more lethargic than usual.  No hx of seizures, or strokes.  Pt does not take blood thinners.  VS BP 150/80, CBG 158, HR 70.  20g right arm.  4mg  of Zofran given.

## 2022-02-12 NOTE — ED Notes (Signed)
Pt up and ambulated to BR w/o assistance, steady gait noted, pt reports "little lightheadedness" upon standing. This RN remain with pt for safety, pt assisted back to bed and hooked back to monitoring devices

## 2022-02-12 NOTE — ED Provider Notes (Signed)
[facility] EMERGENCY DEPARTMENT Provider Note   CSN: [id number] Arrival date & time: 02/12/22  1537     History {Add pertinent medical, surgical, social history, OB history to HPI:1} Chief Complaint  Patient presents with  . Loss of Consciousness    Kirsten Walsh is a 61 y.o. female w/ HTN, obesity, intractable migraines, h/o basilar migraine, h/o syncopal episodes, L TKR, h/o seizures, mild renal insufficiency who p/w LOC.   Patient reports that she has had "a few days" of a very severe right-sided headache that is worsening.  States that it "feels like the headaches that normally bring me into the hospital."  Located behind her right eye, constant, 10 out of 10, radiates down into her right ear and right side of her neck.  Worse with movement.  Associated with sensitivity to light, nausea and vomiting and silver specks in her vision.  Denies fever/chills, double vision/blurry vision.  States that she was talking to the pastor at church today when all of a sudden she woke up on the ground, and bystanders state that she lost consciousness.  Did not have any chest pain or shortness of breath or palpitations before or after losing consciousness.  Is not sure if she hit her head but complains of midline neck pain that since she states is not normal for her.  Also complains of left knee pain that she thinks is a result of the fall.  Endorses that right sided weakness is normal for her and she does not have any new weakness numbness or tingling.  Endorses feeling more lethargic than usual.  Per chart review, patient was admitted from 12/15/2021 to 12/19/2021 for generalized seizure-like activity in the setting of a migraine that was similar to her prior migraines that included right-sided weakness.  Head CT and brain MRI were normal at that time.  Patient'sTopamax increased to 200 mg twice daily.  Had discussed prolonged EEG monitoring at that time but patient stated she needed to  leave the hospital.  Had plan for follow-up with neurology in 2 to 4 weeks with seizure precautions in place.  When asked about this patient states that "I do not have seizures."  Patient did not bite her tongue or experience urinary incontinence today.  There was no report of seizure-like activity from bystanders on scene.   Loss of Consciousness      Home Medications Prior to Admission medications   Medication Sig Start Date End Date Taking? Authorizing Provider  Butalbital-APAP-Caffeine 50-325-40 MG capsule Take 1 capsule by mouth every 6 (six) hours as needed for headache (migraine). 09/04/20   [provider]  cetirizine (ZYRTEC) 10 MG tablet Take 10 mg by mouth daily as needed for allergies.    [provider]  cloNIDine (CATAPRES) 0.1 MG tablet TAKE 1 TABLET(0.1 MG) BY MOUTH TWICE DAILY Patient taking differently: Take 0.1 mg by mouth 2 (two) times daily. 08/28/18   Howard Pouch, MD  Diclofenac Sodium 3 % GEL Apply 1 application topically 2 (two) times daily as needed (pain). 09/06/20   [provider]  EPINEPHrine 0.3 mg/0.3 mL IJ SOAJ injection Inject 0.3 mg into the muscle as needed for anaphylaxis. 09/18/20   [provider]  fluticasone (FLONASE) 50 MCG/ACT nasal spray Place 1 spray into both nostrils daily as needed for allergies or rhinitis.    [provider]  gabapentin (NEURONTIN) 300 MG capsule Take 300 mg by mouth 2 (two) times daily. 08/26/20   [provider]  hydrochlorothiazide (HYDRODIURIL) 25 MG tablet TAKE 1 TABLET(25 MG) BY MOUTH DAILY Patient taking differently: Take 25 mg by mouth daily. 08/28/18   Howard Pouch, MD  ibuprofen (ADVIL) 800 MG tablet Take 800 mg by mouth every 8 (eight) hours as needed for moderate pain. 12/07/21   [provider]  lidocaine (XYLOCAINE) 5 % ointment Apply 1 Application topically daily as needed for moderate pain. 10/19/21   [provider]  methylPREDNISolone (MEDROL DOSEPAK)  4 MG TBPK tablet Use as directed. 12/19/21   Kathlen Mody, MD  ondansetron (ZOFRAN) 4 MG tablet Take 4 mg by mouth 3 (three) times daily as needed for nausea or vomiting.    [provider]  oxyCODONE (ROXICODONE) 15 MG immediate release tablet Take 15 mg by mouth every 6 (six) hours. 09/04/20   [provider]  pantoprazole (PROTONIX) 40 MG tablet Take 40 mg by mouth in the morning.    [provider]  Phendimetrazine Tartrate 105 MG CP24 Take 105 mg by mouth daily as needed (weight loss). Last dose approx 12/22/20 per pt Patient not taking: Reported on 12/30/2021 09/21/20   [provider]  polyethylene glycol (MIRALAX / GLYCOLAX) 17 g packet Take 17 g by mouth daily as needed for moderate constipation.    [provider]  potassium chloride (KLOR-CON) 10 MEQ tablet Take 2 tablets (20 mEq total) by mouth daily. Patient not taking: Reported on 12/30/2021 02/20/20   Dana Allan, MD  promethazine (PHENERGAN) 25 MG tablet Take 1 tablet (25 mg total) by mouth every 6 (six) hours as needed for nausea or vomiting. 04/30/19   Wieters, Hallie C, PA-C  senna-docusate (SENOKOT-S) 8.6-50 MG tablet Take 2 tablets by mouth 2 (two) times daily. 12/19/21   Kathlen Mody, MD  SUMAtriptan (IMITREX) 100 MG tablet Take 100 mg by mouth every 2 (two) hours as needed for migraine. May repeat in 2 hours if headache persists or recurs.    [provider]  tiZANidine (ZANAFLEX) 4 MG capsule Take 1 capsule (4 mg total) by mouth at bedtime as needed for muscle spasms. 12/19/21   Kathlen Mody, MD  topiramate (TOPAMAX) 200 MG tablet Take 1 tablet (200 mg total) by mouth 2 (two) times daily. 12/19/21   Kathlen Mody, MD  traZODone (DESYREL) 100 MG tablet Take 100 mg by mouth at bedtime as needed for sleep. Patient not taking: Reported on 12/30/2021 12/07/21   [provider]  Vitamin D, Ergocalciferol, (DRISDOL) 1.25 MG (50000 UNIT) CAPS capsule Take 50,000 Units by mouth every  Monday. 09/14/20   [provider]      Allergies    Apple, Aspirin, Daucus carota, and Haloperidol and related    Review of Systems   Review of Systems  Cardiovascular:  Positive for syncope.   Review of systems negative for fevers.  A 10 point review of systems was performed and is negative unless otherwise reported in HPI.   Physical Exam Updated Vital Signs BP 129/77   Pulse 65   Temp 97.9 F (36.6 C) (Oral)   Resp 19   Ht 5\' 5"  (1.651 m)   Wt 95 kg   SpO2 94%   BMI 34.85 kg/m  Physical Exam General: tired- appearing female, lying in bed.  HEENT: PERRLA, EOMI, Sclera anicteric, MMM, trachea midline, no JVD. Neck supple with midline TTP over C6-7, no step-offs or deformities.  Cardiology: RRR, no murmurs/rubs/gallops. BL radial and DP pulses equal bilaterally.  Resp: Normal respiratory rate and effort. CTAB,  no wheezes, rhonchi, crackles.  Abd: Soft, non-tender, non-distended. No rebound tenderness or guarding.  GU: Deferred. MSK: Left knee *** No peripheral edema or signs of trauma. Extremities without deformity or TTP. No cyanosis or clubbing. Skin: warm, dry. No rashes or lesions. Neuro: A&Ox4, CNs II-XII grossly intact. MAEs. Sensation grossly intact. 4/5 strength on RUE/RLE, 5/5 strength LLE/LUE. Intact coordination, intact gait. Normal speech.  Psych: Normal mood and affect.   ED Results / Procedures / Treatments   Labs (all labs ordered are listed, but only abnormal results are displayed) Labs Reviewed  CBC WITH DIFFERENTIAL/PLATELET - Abnormal; Notable for the following components:      Result Value   WBC 3.9 (*)    All other components within normal limits  COMPREHENSIVE METABOLIC PANEL - Abnormal; Notable for the following components:   Potassium 3.4 (*)    Creatinine, Ser 1.12 (*)    Total Protein 6.2 (*)    GFR, Estimated 57 (*)    All other components within normal limits  MAGNESIUM  TOPIRAMATE LEVEL    EKG None  Radiology CT Head Wo  Contrast  Result Date: 02/12/2022 CLINICAL DATA:  Head trauma, moderate-severe; Neck trauma, midline tenderness (Age 63-64y) C7 TTP EXAM: CT HEAD WITHOUT CONTRAST CT CERVICAL SPINE WITHOUT CONTRAST TECHNIQUE: Multidetector CT imaging of the head and cervical spine was performed following the standard protocol without intravenous contrast. Multiplanar CT image reconstructions of the cervical spine were also generated. RADIATION DOSE REDUCTION: This exam was performed according to the departmental dose-optimization program which includes automated exposure control, adjustment of the mA and/or kV according to patient size and/or use of iterative reconstruction technique. COMPARISON:  12/15/2021, 12/16/2021 FINDINGS: CT HEAD FINDINGS Brain: No evidence of acute infarction, hemorrhage, hydrocephalus, extra-axial collection or mass lesion/mass effect. Vascular: No hyperdense vessel or unexpected calcification. Skull: Normal. Negative for fracture or focal lesion. Sinuses/Orbits: No acute finding. Other: Negative for scalp hematoma. CT CERVICAL SPINE FINDINGS Alignment: Facet joints are aligned without dislocation or traumatic listhesis. Dens and lateral masses are aligned. Straightening of the cervical lordosis. Skull base and vertebrae: No acute fracture. No primary bone lesion or focal pathologic process. Soft tissues and spinal canal: No prevertebral fluid or swelling. No visible canal hematoma. Disc levels: Mild disc height loss of C5-6 and C6-7 with mild uncovertebral spurring. Minimal facet joint arthropathy. Upper chest: Included lung apices are clear. Other: None. IMPRESSION: 1. No acute intracranial abnormality. 2. No acute cervical spine fracture or subluxation. Electronically Signed   By: Duanne Guess D.O.   On: 02/12/2022 18:21   CT Cervical Spine Wo Contrast  Result Date: 02/12/2022 CLINICAL DATA:  Head trauma, moderate-severe; Neck trauma, midline tenderness (Age 63-64y) C7 TTP EXAM: CT HEAD  WITHOUT CONTRAST CT CERVICAL SPINE WITHOUT CONTRAST TECHNIQUE: Multidetector CT imaging of the head and cervical spine was performed following the standard protocol without intravenous contrast. Multiplanar CT image reconstructions of the cervical spine were also generated. RADIATION DOSE REDUCTION: This exam was performed according to the departmental dose-optimization program which includes automated exposure control, adjustment of the mA and/or kV according to patient size and/or use of iterative reconstruction technique. COMPARISON:  12/15/2021, 12/16/2021 FINDINGS: CT HEAD FINDINGS Brain: No evidence of acute infarction, hemorrhage, hydrocephalus, extra-axial collection or mass lesion/mass effect. Vascular: No hyperdense vessel or unexpected calcification. Skull: Normal. Negative for fracture or focal lesion. Sinuses/Orbits: No acute finding. Other: Negative for scalp hematoma. CT CERVICAL SPINE FINDINGS Alignment: Facet joints are aligned without dislocation or traumatic listhesis. Dens  and lateral masses are aligned. Straightening of the cervical lordosis. Skull base and vertebrae: No acute fracture. No primary bone lesion or focal pathologic process. Soft tissues and spinal canal: No prevertebral fluid or swelling. No visible canal hematoma. Disc levels: Mild disc height loss of C5-6 and C6-7 with mild uncovertebral spurring. Minimal facet joint arthropathy. Upper chest: Included lung apices are clear. Other: None. IMPRESSION: 1. No acute intracranial abnormality. 2. No acute cervical spine fracture or subluxation. Electronically Signed   By: Duanne GuessNicholas  Plundo D.O.   On: 02/12/2022 18:21   DG Knee 2 Views Left  Result Date: 02/12/2022 CLINICAL DATA:  Larey SeatFell on the knee EXAM: LEFT KNEE - 1-2 VIEW COMPARISON:  05/10/2020 FINDINGS: Interval left knee replacement. Normal alignment and intact hardware. No fracture is seen. Positive for knee effusion soft tissue swelling is present. IMPRESSION: Knee replacement  with knee effusion. No definite acute osseous abnormality Electronically Signed   By: Jasmine PangKim  Fujinaga M.D.   On: 02/12/2022 17:58    Procedures Procedures  {Document cardiac monitor, telemetry assessment procedure when appropriate:1}  Medications Ordered in ED Medications  acetaminophen (TYLENOL) tablet 1,000 mg (1,000 mg Oral Given 02/12/22 2008)  lactated ringers bolus 1,000 mL (0 mLs Intravenous Stopped 02/12/22 1857)  prochlorperazine (COMPAZINE) injection 5 mg (5 mg Intravenous Given 02/12/22 1714)  diphenhydrAMINE (BENADRYL) injection 12.5 mg (12.5 mg Intravenous Given 02/12/22 1715)    ED Course/ Medical Decision Making/ A&P Clinical Course as of 02/12/22 2130  Fri Feb 12, 2022  2012 CT Head Wo Contrast 1. No acute intracranial abnormality. 2. No acute cervical spine fracture or subluxation.   [HN]  2012 DG Knee 2 Views Left Knee replacement with knee effusion. No definite acute osseous abnormality   [HN]  2033 Pain now rated 7/10. Patient appears more comfortable but states that she still would need something more for pain control. Will give another 5 mg IV compazine, and add 10 mg IV dexamethasone and 2g IV mg.  [HN]    Clinical Course User Index [HN] Loetta RoughNaasz, Mackenzy Grumbine N, MD                           Medical Decision Making Amount and/or Complexity of Data Reviewed Labs: ordered. Radiology: ordered. Decision-making details documented in ED Course.  Risk OTC drugs. Prescription drug management.   *** Patient is over all hemodynamically stable and well-appearing though is tired and in pain.  Afebrile.  Patient has long history of migraines that patient states are intermittently very severe.  Given the fact the patient lost consciousness today, which has happened before with her migraines, will obtain a CT head and CT C-spine to rule out traumatic bleed or fracture given her midline C-spine tenderness.  Low concern for cardiac cause of syncope such as arrhythmia or ACS given  patient's lack of chest pain/shortness of breath/palpitations, but will obtain an EKG for evaluation. Given patient's history of seizures, also consider seizure episode today.  Patient has been compliant with her Topamax per neurology but states that she does not have seizures.  There was no report of seizure-like activity from bystanders on scene.  For the patient's headache, patient states that this is similar to her prior severe migraines low concern for infectious cause of headache such as meningitis/encephalitis given patient's lack of fevers and consistency with her previous migraines.  Will evaluate for ICH. No TTP of the temple to indicate TGA. No changes to vision and eye exam  normal, low c/f IIH/AACG.   Lab w/u significant for ***  Clinical Course as of 02/12/22 2130  Fri Feb 12, 2022  2012 CT Head Wo Contrast 1. No acute intracranial abnormality. 2. No acute cervical spine fracture or subluxation.   [HN]  2012 DG Knee 2 Views Left Knee replacement with knee effusion. No definite acute osseous abnormality   [HN]  2033 Pain now rated 7/10. Patient appears more comfortable but states that she still would need something more for pain control. Will give another 5 mg IV compazine, and add 10 mg IV dexamethasone and 2g IV mg.  [HN]    Clinical Course User Index [HN] Loetta Rough, MD     {Document critical care time when appropriate:1} {Document review of labs and clinical decision tools ie heart score, Chads2Vasc2 etc:1}  {Document your independent review of radiology images, and any outside records:1} {Document your discussion with family members, caretakers, and with consultants:1} {Document social determinants of health affecting pt's care:1} {Document your decision making why or why not admission, treatments were needed:1} Final Clinical Impression(s) / ED Diagnoses Final diagnoses:  None    Rx / DC Orders ED Discharge Orders     None

## 2022-02-13 NOTE — ED Notes (Signed)
DC instructions reviewed with pt. Pt verbalized understanding.  Pt DC 

## 2022-02-15 DIAGNOSIS — Z6832 Body mass index (BMI) 32.0-32.9, adult: Secondary | ICD-10-CM | POA: Diagnosis not present

## 2022-02-15 DIAGNOSIS — I10 Essential (primary) hypertension: Secondary | ICD-10-CM | POA: Diagnosis not present

## 2022-02-15 DIAGNOSIS — R030 Elevated blood-pressure reading, without diagnosis of hypertension: Secondary | ICD-10-CM | POA: Diagnosis not present

## 2022-02-15 DIAGNOSIS — M545 Low back pain, unspecified: Secondary | ICD-10-CM | POA: Diagnosis not present

## 2022-02-15 DIAGNOSIS — K5900 Constipation, unspecified: Secondary | ICD-10-CM | POA: Diagnosis not present

## 2022-02-15 DIAGNOSIS — Z79899 Other long term (current) drug therapy: Secondary | ICD-10-CM | POA: Diagnosis not present

## 2022-02-16 DIAGNOSIS — R55 Syncope and collapse: Secondary | ICD-10-CM | POA: Diagnosis not present

## 2022-02-16 DIAGNOSIS — G43909 Migraine, unspecified, not intractable, without status migrainosus: Secondary | ICD-10-CM | POA: Diagnosis not present

## 2022-02-16 DIAGNOSIS — I10 Essential (primary) hypertension: Secondary | ICD-10-CM | POA: Diagnosis not present

## 2022-02-16 DIAGNOSIS — R7303 Prediabetes: Secondary | ICD-10-CM | POA: Diagnosis not present

## 2022-02-19 DIAGNOSIS — Z419 Encounter for procedure for purposes other than remedying health state, unspecified: Secondary | ICD-10-CM | POA: Diagnosis not present

## 2022-02-19 NOTE — Therapy (Unsigned)
[facility] [address] [place], [place], [address] Phone: [phone]   Fax:  [phone]  Patient Details  Name: Kirsten Walsh MRN: [id number] Date of Birth: 15-Sep-1960 Referring Provider:  No ref. provider found  Encounter Date: 02/19/2022  PHYSICAL THERAPY DISCHARGE SUMMARY  Visits from Start of Care: 10  Current functional level related to goals / functional outcomes: Patient remains ModI, but also with significant daily migraines resulting in syncopal episodes at times. Patient still with intractable back pain as well. Unable to assess LTGs as patient did not return.    Remaining deficits: See above.    Education / Equipment: PT POC, HEP, back pain yellow/red flags as it pertains to PT    Patient agrees to discharge. Patient goals were  unable to be assessed . Patient is being discharged due to did not respond to therapy.   Westley Foots, PT Westley Foots, PT, DPT, CBIS  02/19/2022, 8:03 AM  [facility] [address] [place], [place], [address] Phone: [phone]   Fax:  [phone]

## 2022-02-23 DIAGNOSIS — I10 Essential (primary) hypertension: Secondary | ICD-10-CM | POA: Diagnosis not present

## 2022-02-23 DIAGNOSIS — M5136 Other intervertebral disc degeneration, lumbar region: Secondary | ICD-10-CM | POA: Diagnosis not present

## 2022-02-23 DIAGNOSIS — G43909 Migraine, unspecified, not intractable, without status migrainosus: Secondary | ICD-10-CM | POA: Diagnosis not present

## 2022-03-15 DIAGNOSIS — M545 Low back pain, unspecified: Secondary | ICD-10-CM | POA: Diagnosis not present

## 2022-03-18 DIAGNOSIS — Z6834 Body mass index (BMI) 34.0-34.9, adult: Secondary | ICD-10-CM | POA: Diagnosis not present

## 2022-03-18 DIAGNOSIS — I10 Essential (primary) hypertension: Secondary | ICD-10-CM | POA: Diagnosis not present

## 2022-03-18 DIAGNOSIS — G43909 Migraine, unspecified, not intractable, without status migrainosus: Secondary | ICD-10-CM | POA: Diagnosis not present

## 2022-03-18 DIAGNOSIS — M62838 Other muscle spasm: Secondary | ICD-10-CM | POA: Diagnosis not present

## 2022-03-18 DIAGNOSIS — Z79899 Other long term (current) drug therapy: Secondary | ICD-10-CM | POA: Diagnosis not present

## 2022-03-21 DIAGNOSIS — Z419 Encounter for procedure for purposes other than remedying health state, unspecified: Secondary | ICD-10-CM | POA: Diagnosis not present

## 2022-03-23 NOTE — Therapy (Signed)
OUTPATIENT PHYSICAL THERAPY THORACOLUMBAR EVALUATION   Patient Name: Kirsten Walsh MRN: [id number] DOB:Jul 03, 1960, 61 y.o., female Today's Date: 03/24/2022   PT End of Session - 03/24/22 1513     Visit Number 1    Number of Visits 9    Date for PT Re-Evaluation 05/19/22    Authorization Type Medicaid/[employer]    Authorization Time Period auth pending    Progress Note Due on Visit 10    PT Start Time 1515   pt arrives late   PT Stop Time 1546    PT Time Calculation (min) 31 min    Activity Tolerance Patient limited by pain;Patient tolerated treatment well    Behavior During Therapy [facility] for tasks assessed/performed             Past Medical History:  Diagnosis Date   Arthritis    Chest pain    Chronic lower back pain    "right side to mid back" (07/06/2017)   Family history of adverse reaction to anesthesia    "daughter did; not sure happened"   Gall stones    History of anaphylaxis 06/26/2014   History of kidney stones    Hypertension    Migraine    "qd" (07/06/2017)   Syncope and collapse 07/06/2017    sitting on the commode; developed nausea and passed out; hit head on shower and suffered a laceration; woke up in "a pool of blood"   Past Surgical History:  Procedure Laterality Date   CHONDROPLASTY Left 08/17/2019   Procedure: CHONDROPLASTY;  Surgeon: Frederico Hamman, MD;  Location: [facility];  Service: Orthopedics;  Laterality: Left;   CYSTOSCOPY W/ STONE MANIPULATION  ~ 2006   INTRAUTERINE DEVICE INSERTION     "initially put in in 04/2002; changed prn" (02/14/2013)   KNEE ARTHROSCOPY WITH LATERAL MENISECTOMY  08/17/2019   Procedure: KNEE ARTHROSCOPY WITH LATERAL MENISECTOMY;  Surgeon: Frederico Hamman, MD;  Location: [facility];  Service: Orthopedics;;   KNEE ARTHROSCOPY WITH MEDIAL MENISECTOMY Left 08/17/2019   Procedure: KNEE ARTHROSCOPY WITH MEDIAL MENISECTOMY;  Surgeon: Frederico Hamman, MD;  Location: [facility];   Service: Orthopedics;  Laterality: Left;   LAPAROSCOPIC CHOLECYSTECTOMY  ~ 2004   TOTAL KNEE ARTHROPLASTY Left 01/16/2021   Procedure: TOTAL KNEE ARTHROPLASTY;  Surgeon: Beverely Low, MD;  Location: [facility];  Service: Orthopedics;  Laterality: Left;  with adductor canal   Patient Active Problem List   Diagnosis Date Noted   Seizures (HCC) 12/17/2021   Seizure-like activity (HCC) 12/15/2021   S/P TKR (total knee replacement), left 01/18/2021   H/O total knee replacement, left 01/16/2021   Cervical cancer screening 03/09/2020   Encounter for administration of vaccine 03/09/2020   Preoperative clearance 07/24/2019   Right arm pain 06/12/2019   Syncope 07/06/2017   Chronic daily headache 09/02/2015   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   Syncopal episodes 09/02/2015   Right arm weakness 08/05/2015   Edema of right arm and right leg 09/07/2014   Intractable migraine with status migrainosus 06/10/2014   Chronic migraine without aura, intractable, with status migrainosus    Bradycardia 02/18/2013   Left knee pain 01/16/2013   Cardiomyopathy 10/20/2011   Leg swelling 09/23/2011   Complex regional pain syndrome of right lower extremity 05/03/2011   Chronic pain 03/29/2011   NECK PAIN, RIGHT 02/03/2010   ALLERGIC RHINITIS 01/30/2009   DIZZINESS 07/16/2008   Nonintractable chronic migraine 04/26/2007   OBESITY, NOS 08/18/2006   HYPERTENSION, BENIGN SYSTEMIC 08/18/2006  PCP: [name] Mattocks, DO  REFERRING PROVIDER: Fleet Contras, MD   REFERRING DIAG: 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 Pt states she cannot stand or walk very long, particularly when pain is worse. Pt states that bending bothers back. Pt states when symptoms are worse she has difficulty with daily activities and son has to help with housework and getting socks/shoes on. Also has chronic migraines. Pt states that she did a bout of PT earlier this year, felt that aquatic therapy was most beneficial. Denies bowel/bladder changes, saddle anesthesia.   PERTINENT HISTORY:  Hx syncope, hx seizure like activity, multiple MSK surgeries, chronic migraines  PAIN:  Are you having pain: yes, pt declines use of NPS, describes as moderate at present Location: B low back, some referral into R leg How would you describe your pain? Sharp pain, shoots into leg Best in past week: pt declines NPS Worst in past week: pt declines NPS, describes as severe at worst Aggravating factors: walking/standing , going up/down stairs, driving, bending/lifting Easing factors: changing positions, ice/heat, medications (variable levels of relief with modalities, depends on day)    PRECAUTIONS: Fall, seizure   WEIGHT BEARING RESTRICTIONS No  FALLS:  Has patient fallen in last 6 months? Yes. Number of falls approx 4 in past six months, all of which preceded by "passing out", denies balance issues   LIVING ENVIRONMENT: Lives with: lives with their son Lives in: House/apartment, 3 years Stairs: 1 flight between levels 1 rail to bedroom Has following equipment at home: Single point cane and Environmental consultant - 4 wheeled  OCCUPATION: in home care, facility management - states she has to do lots of walking, driving, general activity  PLOF: independent, hasn't used DME since shortly after knee replacement  PATIENT GOALS would like to do aquatic therapy. reduce pain, increase activity   OBJECTIVE:    DIAGNOSTIC FINDINGS:  Per lumbar XR June 2023: IMPRESSION: 1. Central disc protrusion with slight superior migration at L5-S1, closely approximating and potentially affecting either of the descending S1 nerve roots as they course  through the lateral recesses, slightly greater on the left. 2. Shallow left extraforaminal disc protrusion at L4-5, closely approximating and potentially irritating the exiting left L4 nerve root. 3. Right subarticular disc protrusion at L3-4 with resultant mild right lateral recess stenosis.  Per CT head/spine Aug 2023: IMPRESSION: 1. No acute intracranial abnormality. 2. No acute cervical spine fracture or subluxation.  PATIENT SURVEYS:  ODI unable to be completed at initial evaluation due to time constraints, plan to administer at next session  SCREENING FOR RED FLAGS: Red flag questioning unremarkable  COGNITION:  Overall cognitive status: Within functional limits for tasks assessed     SENSATION: WFL all extremities    POSTURE: guarded UE posture, excessive UT elevation bilat  PALPATION: Deferred on this date due to time constraints  LUMBAR ROM:   Active  A/PROM  eval  Flexion <25% p! Low back   Extension ~10% p!  Right lateral flexion   Left lateral flexion   Right rotation   Left rotation    (Blank rows = not tested) Comment: report of RLE tingling after lumbar testing, deferred further due to symptom irritability   LOWER EXTREMITY ROM:     Active  Right eval Left eval  Hip flexion    Hip extension    Hip internal rotation    Hip external rotation     (Blank rows = not tested)  Comments:  Formal assessment deferred given time constraints and symptom irritability - grossly WNL B LE sagittal plane  LOWER EXTREMITY MMT:    MMT Right eval Left eval  Hip flexion    Hip abduction (modified sitting) 4- 4-  Hip internal rotation    Hip external rotation    Knee flexion    Knee extension     (Blank rows = not  tested)  Comments: deferred further MMT testing d/t symptom irritability  LUMBAR SPECIAL TESTS:  + seated slump on RLE  FUNCTIONAL TESTS:  5xSTS deferred on this date due to time constraints and symptom irritability  Sit to stand: standard chair - B UE pushing up from chair, reduced fwd trunk lean, narrow BOS w knee valgus B, rigid trunk posture with ascent. Reduced eccentric control back into chair, controlled with UE support. Increased time and effort apparent  GAIT: Distance walked: within clinic Assistive device utilized: None Level of assistance: Complete Independence Comments: partial step through pattern grossly symmetrical B, reduced step length, reduced gait speed, reduced     TODAY'S TREATMENT  [facility] Adult PT Treatment:                                                DATE: 03/24/22 Therapeutic Exercise: Seated TA activation x10, tactile cues for contraction, education for HEP   PATIENT EDUCATION:  Education details: Pt education on PT impairments, prognosis, and POC. Rationale for interventions, safe/appropriate HEP performance Person educated: Patient Education method: Explanation, Demonstration, Tactile cues, Verbal cues, and Handouts Education comprehension: verbalized understanding, returned demonstration, verbal cues required, tactile cues required, and needs further education    HOME EXERCISE PROGRAM: Access Code: [email or web address] URL: [email or web address] Date: 03/24/2022 Prepared by: Fransisco Hertzavid Treon Kehl  Exercises - Seated Transversus Abdominis Bracing  - 1 x daily - 7 x weekly - 2 sets - 8 reps  ASSESSMENT:  CLINICAL IMPRESSION: Pt is a 61 year old woman who arrives to PT evaluation on this  date for low back pain. Pt reports difficulty with majority of daily/work activities due to pain, sometimes requiring assist from her son when symptoms at worst. Reports most difficulty with standing/walking, bending/lifting, and lower body dressing. During today's  session pt demonstrates limitations in lumbar mobility and LE strength which are limiting ability to perform aforementioned activities. Examination is limited by time constraints (late arrival) and symptom irritability, plan to administer ODI at next visit. Pt tolerates HEP well with no increase in pain. Recommend skilled PT to address aforementioned deficits to improve functional independence/tolerance - pt voices strong interest in aquatic therapy.    OBJECTIVE IMPAIRMENTS Abnormal gait, decreased activity tolerance, decreased endurance, decreased mobility, difficulty walking, decreased ROM, decreased strength, impaired perceived functional ability, impaired flexibility, improper body mechanics, postural dysfunction, and pain.   ACTIVITY LIMITATIONS carrying, lifting, bending, standing, squatting, stairs, transfers, dressing, and locomotion level  PARTICIPATION LIMITATIONS: meal prep, cleaning, laundry, driving, community activity, and occupation  PERSONAL FACTORS Past/current experiences, Time since onset of injury/illness/exacerbation, and 1-2 comorbidities: HTN, syncope  are also affecting patient's functional outcome.   REHAB POTENTIAL: Fair given chronicity and comorbidities  CLINICAL DECISION MAKING: Evolving/moderate complexity  EVALUATION COMPLEXITY: Moderate   GOALS: Goals reviewed with patient? No given time constraints  SHORT TERM GOALS: Target date: 04/21/2022  Pt will demonstrate appropriate understanding and performance of initially prescribed HEP in order to facilitate improved independence with management of symptoms.  Baseline: HEP provided on eval Goal status: INITIAL  2. Pt will score improve greater than MCID on ODI in order to demonstrate improved perception of function due to symptoms.  Baseline: deferred due to time constraints, administer next session  Goal status: INITIAL    LONG TERM GOALS: Target date: 05/19/2022  Pt will improve greater than 2x MCID on  ODI in order to demonstrate improved perception of functional status due to symptoms.  Baseline: deferred due to time constraints, administer next session Goal status: INITIAL  2.  Pt will demonstrate equal to or greater than 75% lumbar flexion AROM with less than 2 point increase on NPS in order to demonstrate improved tolerance to lower body dressing.  Baseline: <25% lumbar flexion, severe pain Goal status: INITIAL  3.  Pt will demonstrate ability to navigate up to 12 stairs safely without use of rail in order to demonstrate improved safety with household navigation Baseline: moderate pain/difficulty reported with navigating stairs Goal status: INITIAL  4. Pt will perform single sit to stand without UE support and mechanics grossly within normal limits in order to demonstrate improved independence/safety with transfers.  Baseline: B UE pushing up from chair, reduced fwd trunk lean, narrow BOS w knee valgus B, rigid trunk posture with ascent. Reduced eccentric control back into chair, controlled with UE support. Increased time and effort apparent  Goal status: INITIAL    PLAN: PT FREQUENCY: 1x/week  PT DURATION: 8 weeks  PLANNED INTERVENTIONS: Therapeutic exercises, Therapeutic activity, Neuromuscular re-education, Balance training, Gait training, Patient/Family education, Self Care, Joint mobilization, Stair training, Aquatic Therapy, Dry Needling, Electrical stimulation, Spinal mobilization, Cryotherapy, Moist heat, Manual therapy, and Re-evaluation.  PLAN FOR NEXT SESSION: Review HEP, update as appropriate. Additional core stability exercises as tolerated. Discuss transition to aquatic therapy    Ashley Murrain PT, DPT 03/24/2022 5:10 PM    [employer] Authorization   Choose one: Rehabilitative  Standardized Assessment or Functional Outcome Tool: Other Sit to stand - altered mechanics/pain as described above, 5xSTS deferred due to symptom irritability  (subjective functional  outcome  measure deferred on initial evaluation due to time constraints, pt late arrival - plan to administer ODI next visit)  Score or Percent Disability: (deferred until next visit, plan to administer ODI next session)  Body Parts Treated (Select each separately):  Lumbopelvic. Overall deficits/functional limitations for body part selected: moderate  If treatment provided at initial evaluation, no treatment charged due to lack of authorization.

## 2022-03-24 ENCOUNTER — Encounter: Payer: Self-pay | Admitting: Physical Therapy

## 2022-03-24 ENCOUNTER — Ambulatory Visit: Payer: Medicaid Other | Attending: Internal Medicine | Admitting: Physical Therapy

## 2022-03-24 ENCOUNTER — Other Ambulatory Visit: Payer: Self-pay

## 2022-03-24 DIAGNOSIS — M5459 Other low back pain: Secondary | ICD-10-CM | POA: Diagnosis not present

## 2022-03-24 DIAGNOSIS — M6281 Muscle weakness (generalized): Secondary | ICD-10-CM | POA: Insufficient documentation

## 2022-03-24 DIAGNOSIS — R2689 Other abnormalities of gait and mobility: Secondary | ICD-10-CM | POA: Diagnosis not present

## 2022-03-30 ENCOUNTER — Ambulatory Visit: Payer: Medicaid Other | Admitting: Physical Therapy

## 2022-03-30 ENCOUNTER — Telehealth: Payer: Self-pay | Admitting: Physical Therapy

## 2022-03-30 NOTE — Telephone Encounter (Signed)
Called pt re: no show for 4:30p appt - pt answers, states she thought appt was tomorrow. Advised on attendance policy. Confirmed next scheduled appointment, pt states she will call office to reschedule today's appointment

## 2022-03-31 NOTE — Therapy (Signed)
OUTPATIENT PHYSICAL THERAPY TREATMENT NOTE   Patient Name: Kirsten Walsh MRN: [id number] DOB:[birth year], 61 y.o., female 73 Date: 04/01/2022  PCP: Wells Guiles, DO   REFERRING PROVIDER: Nolene Ebbs, MD  END OF SESSION:   PT End of Session - 04/01/22 1635     Visit Number 2    Number of Visits 9    Date for PT Re-Evaluation 05/19/22    Authorization Type Medicaid/[employer]    Authorization Time Period 03/30/22-05/15/22    Authorization - Visit Number 1    Authorization - Number of Visits 4    Progress Note Due on Visit 10    PT Start Time [id number]    PT Stop Time [id number]    PT Time Calculation (min) 44 min    Activity Tolerance Patient limited by pain;Patient tolerated treatment well    Behavior During Therapy [facility] for tasks assessed/performed             Past Medical History:  Diagnosis Date   Arthritis    Chest pain    Chronic lower back pain    "right side to mid back" (07/06/2017)   Family history of adverse reaction to anesthesia    "daughter did; not sure happened"   Gall stones    History of anaphylaxis 06/26/2014   History of kidney stones    Hypertension    Migraine    "qd" (07/06/2017)   Syncope and collapse 07/06/2017    sitting on the commode; developed nausea and passed out; hit head on shower and suffered a laceration; woke up in "a pool of blood"   Past Surgical History:  Procedure Laterality Date   CHONDROPLASTY Left 08/17/2019   Procedure: CHONDROPLASTY;  Surgeon: Earlie Server, MD;  Location: [place];  Service: Orthopedics;  Laterality: Left;   CYSTOSCOPY W/ STONE MANIPULATION  ~ 2006   INTRAUTERINE DEVICE INSERTION     "initially put in in 04/2002; changed prn" (02/14/2013)   KNEE ARTHROSCOPY WITH LATERAL MENISECTOMY  08/17/2019   Procedure: KNEE ARTHROSCOPY WITH LATERAL MENISECTOMY;  Surgeon: Earlie Server, MD;  Location: [place];  Service: Orthopedics;;   KNEE ARTHROSCOPY WITH MEDIAL MENISECTOMY  Left 08/17/2019   Procedure: KNEE ARTHROSCOPY WITH MEDIAL MENISECTOMY;  Surgeon: Earlie Server, MD;  Location: [place];  Service: Orthopedics;  Laterality: Left;   LAPAROSCOPIC CHOLECYSTECTOMY  ~ 2004   TOTAL KNEE ARTHROPLASTY Left 01/16/2021   Procedure: TOTAL KNEE ARTHROPLASTY;  Surgeon: Netta Cedars, MD;  Location: [facility];  Service: Orthopedics;  Laterality: Left;  with adductor canal   Patient Active Problem List   Diagnosis Date Noted   Seizures ([place]) 12/17/2021   Seizure-like activity ([place]) 12/15/2021   S/P TKR (total knee replacement), left 01/18/2021   H/O total knee replacement, left 01/16/2021   Cervical cancer screening 03/09/2020   Encounter for administration of vaccine 03/09/2020   Preoperative clearance 07/24/2019   Right arm pain 06/12/2019   Syncope 07/06/2017   Chronic daily headache 09/02/2015   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   Syncopal episodes 09/02/2015   Right arm weakness 08/05/2015   Edema of right arm and right leg 09/07/2014   Intractable migraine with status migrainosus 06/10/2014   Chronic migraine without aura, intractable, with status migrainosus    Bradycardia 02/18/2013   Left knee pain 01/16/2013   Cardiomyopathy 10/20/2011   Leg swelling 09/23/2011   Complex regional pain syndrome of right lower extremity 05/03/2011   Chronic pain 03/29/2011   NECK PAIN, RIGHT 02/03/2010  ALLERGIC RHINITIS 01/30/2009   DIZZINESS 07/16/2008   Nonintractable chronic migraine 04/26/2007   OBESITY, NOS 08/18/2006   HYPERTENSION, BENIGN SYSTEMIC 08/18/2006    REFERRING DIAG: REFERRING DIAG: 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 Pt states she felt "pretty good" after initial evaluation and felt like TA activation was helpful. Pt states she has done some HEP repetitions which seemed to help some, although she states if she had done more they would have been more helpful.   PAIN:  Are you having pain: yes, pt declines use of NPS, describes as moderate at present Location: B low back, some referral into R leg How would you describe your pain? Sharp pain, shoots into leg Best in past week: pt declines NPS Worst in past week: pt declines NPS, describes as severe at worst Aggravating factors: walking/standing , going up/down stairs, driving, bending/lifting Easing factors: changing positions, ice/heat, medications (variable levels of relief with modalities, depends on day)    OBJECTIVE: (objective measures completed at initial evaluation unless otherwise dated)   DIAGNOSTIC FINDINGS:  Per lumbar XR June 2023: IMPRESSION: 1. Central disc protrusion with slight superior migration at L5-S1, closely approximating and potentially affecting either of the descending S1 nerve roots as they course through the lateral recesses, slightly greater on the left. 2. Shallow left extraforaminal disc protrusion at L4-5, closely approximating and potentially irritating the exiting left L4 nerve root. 3. Right subarticular disc protrusion at L3-4 with resultant mild right lateral recess stenosis.   Per CT head/spine Aug 2023: IMPRESSION: 1. No acute intracranial abnormality. 2. No acute cervical spine fracture or subluxation.   PATIENT SURVEYS:  ODI 04/01/22: 23/50 raw score - indicative of moderate disability   SCREENING FOR RED FLAGS: Red flag questioning unremarkable   COGNITION:           Overall cognitive status: Within functional limits for tasks assessed                          SENSATION: WFL  all extremities       POSTURE: guarded UE posture, excessive UT elevation bilat   PALPATION: Deferred on this date due to time constraints   LUMBAR ROM:    Active  A/PROM  eval  Flexion <25% p! Low back   Extension ~10% p!  Right lateral flexion    Left lateral flexion    Right rotation    Left rotation     (Blank rows = not tested) Comment: report of RLE tingling after lumbar testing, deferred further due to symptom irritability     LOWER EXTREMITY ROM:      Active  Right eval Left eval  Hip flexion      Hip extension      Hip internal rotation      Hip external rotation       (Blank rows = not tested)   Comments:  Formal assessment deferred given time constraints and symptom irritability - grossly WNL B LE sagittal plane   LOWER EXTREMITY MMT:  MMT Right eval Left eval  Hip flexion      Hip abduction (modified sitting) 4- 4-  Hip internal rotation      Hip external rotation      Knee flexion      Knee extension       (Blank rows = not tested)   Comments: deferred further MMT testing d/t symptom irritability   LUMBAR SPECIAL TESTS:  + seated slump on RLE   FUNCTIONAL TESTS:  5xSTS deferred on this date due to time constraints and symptom irritability   Sit to stand: standard chair - B UE pushing up from chair, reduced fwd trunk lean, narrow BOS w knee valgus B, rigid trunk posture with ascent. Reduced eccentric control back into chair, controlled with UE support. Increased time and effort apparent   GAIT: Distance walked: within clinic Assistive device utilized: None Level of assistance: Complete Independence Comments: partial step through pattern grossly symmetrical B, reduced step length, reduced gait speed, reduced        TODAY'S TREATMENT  [facility] Adult PT Treatment:                                                DATE: 04/01/22 Neuromuscular re-ed: Adductor iso 2x12 Swiss ball press downs, seated, 2x10, cues for breath control and  comfortable ROM Ta activation x12, cues for breath control and pacing Introduction to pain neuroscience   [facility] Adult PT Treatment:                                                DATE: 03/24/22 Therapeutic Exercise: Seated TA activation x10, tactile cues for contraction, education for HEP     PATIENT EDUCATION:  Education details: Significant time spent with education/discussion re: pt pain behaviors, pain neuroscience education, rationale for interventions, activity modification, and safety + appropriate HEP performance Person educated: Patient Education method: Explanation, Demonstration, Tactile cues, Verbal cues  Education comprehension: verbalized understanding, returned demonstration, verbal cues required, tactile cues required, and needs further education      HOME EXERCISE PROGRAM: Access Code: [email or web address] URL: [email or web address] Date: 03/24/2022 Prepared by: Enis Slipper   Exercises - Seated Transversus Abdominis Bracing  - 1 x daily - 7 x weekly - 2 sets - 8 reps   ASSESSMENT:   CLINICAL IMPRESSION: Pt is a 61 year old woman who arrives to PT evaluation on this date for low back pain. Pt arrives with increased pain on this date although she notes that exercises from HEP seem to be helpful. Pt continues to be limited by high pain levels and symptom irritability, requiring increased rest breaks on this date. Session emphasizing gentle lumbopelvic activation/stability exercises within pt tolerance, although much of session spent with pt education/discussion re: pain behaviors, rationale for intervention, and introduction to pain neuroscience. Pt reports mild improvement in symptoms on departure but denies any significant changes in pain compared to arrival. Departs in no acute distress, no adverse events during session. Pt remains limited by high pain levels, reduced lumbar/LE mobility and core/LE weakness. Recommend skilled PT to address these deficits and maximize  functional independence.     OBJECTIVE IMPAIRMENTS Abnormal gait, decreased activity tolerance, decreased endurance, decreased mobility, difficulty walking, decreased ROM, decreased strength,  impaired perceived functional ability, impaired flexibility, improper body mechanics, postural dysfunction, and pain.    ACTIVITY LIMITATIONS carrying, lifting, bending, standing, squatting, stairs, transfers, dressing, and locomotion level   PARTICIPATION LIMITATIONS: meal prep, cleaning, laundry, driving, community activity, and occupation   PERSONAL FACTORS Past/current experiences, Time since onset of injury/illness/exacerbation, and 1-2 comorbidities: HTN, syncope  are also affecting patient's functional outcome.    REHAB POTENTIAL: Fair given chronicity and comorbidities   CLINICAL DECISION MAKING: Evolving/moderate complexity   EVALUATION COMPLEXITY: Moderate     GOALS: Goals reviewed with patient? No given time constraints   SHORT TERM GOALS: Target date: 04/21/2022   Pt will demonstrate appropriate understanding and performance of initially prescribed HEP in order to facilitate improved independence with management of symptoms.  Baseline: HEP provided on eval Goal status: INITIAL   2. Pt will score improve greater than MCID on ODI in order to demonstrate improved perception of function due to symptoms.            Baseline: 23/50 raw score            Goal status: INITIAL     LONG TERM GOALS: Target date: 05/19/2022   Pt will improve greater than 2x MCID on ODI in order to demonstrate improved perception of functional status due to symptoms.  Baseline: 23/50 raw score  Goal status: INITIAL   2.  Pt will demonstrate equal to or greater than 75% lumbar flexion AROM with less than 2 point increase on NPS in order to demonstrate improved tolerance to lower body dressing.   Baseline: <25% lumbar flexion, severe pain Goal status: INITIAL   3.  Pt will demonstrate ability to navigate up  to 12 stairs safely without use of rail in order to demonstrate improved safety with household navigation Baseline: moderate pain/difficulty reported with navigating stairs Goal status: INITIAL   4. Pt will perform single sit to stand without UE support and mechanics grossly within normal limits in order to demonstrate improved independence/safety with transfers.            Baseline: B UE pushing up from chair, reduced fwd trunk lean, narrow BOS w knee valgus B, rigid trunk posture with ascent. Reduced eccentric control back into chair, controlled with UE support. Increased time and effort apparent            Goal status: INITIAL      PLAN: PT FREQUENCY: 1x/week   PT DURATION: 8 weeks   PLANNED INTERVENTIONS: Therapeutic exercises, Therapeutic activity, Neuromuscular re-education, Balance training, Gait training, Patient/Family education, Self Care, Joint mobilization, Stair training, Aquatic Therapy, Dry Needling, Electrical stimulation, Spinal mobilization, Cryotherapy, Moist heat, Manual therapy, and Re-evaluation.   PLAN FOR NEXT SESSION: review/update HEP, progress lumbar/hip exercises as able/appropriate. Initiate aquatic therapy.    Leeroy Cha PT, DPT 04/01/2022 5:28 PM

## 2022-04-01 ENCOUNTER — Ambulatory Visit: Payer: Medicaid Other | Admitting: Physical Therapy

## 2022-04-01 ENCOUNTER — Encounter: Payer: Self-pay | Admitting: Physical Therapy

## 2022-04-01 DIAGNOSIS — M5459 Other low back pain: Secondary | ICD-10-CM | POA: Diagnosis not present

## 2022-04-01 DIAGNOSIS — M6281 Muscle weakness (generalized): Secondary | ICD-10-CM | POA: Diagnosis not present

## 2022-04-01 DIAGNOSIS — R2689 Other abnormalities of gait and mobility: Secondary | ICD-10-CM | POA: Diagnosis not present

## 2022-04-08 NOTE — Therapy (Signed)
OUTPATIENT PHYSICAL THERAPY TREATMENT NOTE   Patient Name: Kirsten Walsh MRN: [id number] DOB:September 15, 1960, 61 y.o., female 5 Date: 04/09/2022  PCP: Wells Guiles, DO   REFERRING PROVIDER: Nolene Ebbs, MD  END OF SESSION:   PT End of Session - 04/09/22 1606     Visit Number 3    Number of Visits 9    Date for PT Re-Evaluation 05/19/22    Authorization Type Medicaid/[employer]    Authorization Time Period 03/30/22-05/15/22    Authorization - Visit Number 2    Authorization - Number of Visits 4    Progress Note Due on Visit 10    PT Start Time [id number]    PT Stop Time 1650    PT Time Calculation (min) 45 min    Activity Tolerance Patient limited by pain;Patient tolerated treatment well    Behavior During Therapy [facility] for tasks assessed/performed              Past Medical History:  Diagnosis Date   Arthritis    Chest pain    Chronic lower back pain    "right side to mid back" (07/06/2017)   Family history of adverse reaction to anesthesia    "daughter did; not sure happened"   Gall stones    History of anaphylaxis 06/26/2014   History of kidney stones    Hypertension    Migraine    "qd" (07/06/2017)   Syncope and collapse 07/06/2017    sitting on the commode; developed nausea and passed out; hit head on shower and suffered a laceration; woke up in "a pool of blood"   Past Surgical History:  Procedure Laterality Date   CHONDROPLASTY Left 08/17/2019   Procedure: CHONDROPLASTY;  Surgeon: Earlie Server, MD;  Location: [place];  Service: Orthopedics;  Laterality: Left;   CYSTOSCOPY W/ STONE MANIPULATION  ~ 2006   INTRAUTERINE DEVICE INSERTION     "initially put in in 04/2002; changed prn" (02/14/2013)   KNEE ARTHROSCOPY WITH LATERAL MENISECTOMY  08/17/2019   Procedure: KNEE ARTHROSCOPY WITH LATERAL MENISECTOMY;  Surgeon: Earlie Server, MD;  Location: [place];  Service: Orthopedics;;   KNEE ARTHROSCOPY WITH MEDIAL  MENISECTOMY Left 08/17/2019   Procedure: KNEE ARTHROSCOPY WITH MEDIAL MENISECTOMY;  Surgeon: Earlie Server, MD;  Location: [place];  Service: Orthopedics;  Laterality: Left;   LAPAROSCOPIC CHOLECYSTECTOMY  ~ 2004   TOTAL KNEE ARTHROPLASTY Left 01/16/2021   Procedure: TOTAL KNEE ARTHROPLASTY;  Surgeon: Netta Cedars, MD;  Location: [facility];  Service: Orthopedics;  Laterality: Left;  with adductor canal   Patient Active Problem List   Diagnosis Date Noted   Seizures ([place]) 12/17/2021   Seizure-like activity ([place]) 12/15/2021   S/P TKR (total knee replacement), left 01/18/2021   H/O total knee replacement, left 01/16/2021   Cervical cancer screening 03/09/2020   Encounter for administration of vaccine 03/09/2020   Preoperative clearance 07/24/2019   Right arm pain 06/12/2019   Syncope 07/06/2017   Chronic daily headache 09/02/2015   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   Syncopal episodes 09/02/2015   Right arm weakness 08/05/2015   Edema of right arm and right leg 09/07/2014   Intractable migraine with status migrainosus 06/10/2014   Chronic migraine without aura, intractable, with status migrainosus    Bradycardia 02/18/2013   Left knee pain 01/16/2013   Cardiomyopathy 10/20/2011   Leg swelling 09/23/2011   Complex regional pain syndrome of right lower extremity 05/03/2011   Chronic pain 03/29/2011   NECK PAIN, RIGHT  02/03/2010   ALLERGIC RHINITIS 01/30/2009   DIZZINESS 07/16/2008   Nonintractable chronic migraine 04/26/2007   OBESITY, NOS 08/18/2006   HYPERTENSION, BENIGN SYSTEMIC 08/18/2006    REFERRING DIAG: REFERRING DIAG: 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  PAIN:  Are you having pain: yes, pt declines use of NPS, describes as moderate at present  Location: B low back, some referral into R leg How would you describe your pain? Sharp pain, shoots into leg Best in past week: pt declines NPS Worst in past week: pt declines NPS, describes as severe at worst Aggravating factors: walking/standing , going up/down stairs, driving, bending/lifting Easing factors: changing positions, ice/heat, medications (variable levels of relief with modalities, depends on day)    OBJECTIVE: (objective measures completed at initial evaluation unless otherwise dated)   DIAGNOSTIC FINDINGS:  Per lumbar XR June 2023: IMPRESSION: 1. Central disc protrusion with slight superior migration at L5-S1, closely approximating and potentially affecting either of the descending S1 nerve roots as they course through the lateral recesses, slightly greater on the left. 2. Shallow left extraforaminal disc protrusion at L4-5, closely approximating and potentially irritating the exiting left L4 nerve root. 3. Right subarticular disc protrusion at L3-4 with resultant mild right lateral recess stenosis.   Per CT head/spine Aug 2023: IMPRESSION: 1. No acute intracranial abnormality. 2. No acute cervical spine fracture or subluxation.   PATIENT SURVEYS:  ODI 04/01/22: 23/50 raw score - indicative of moderate disability   SCREENING FOR RED FLAGS: Red flag questioning unremarkable   COGNITION:           Overall cognitive status: Within functional limits for tasks assessed                          SENSATION: WFL all extremities       POSTURE: guarded UE posture, excessive UT elevation bilat   PALPATION: Deferred on this date due to time constraints   LUMBAR ROM:    Active  A/PROM  eval  Flexion <25% p! Low back   Extension ~10% p!  Right lateral flexion    Left lateral flexion    Right rotation     Left rotation     (Blank rows = not tested) Comment: report of RLE tingling after lumbar testing, deferred further due to symptom irritability     LOWER EXTREMITY ROM:      Active  Right eval Left eval  Hip flexion      Hip extension      Hip internal rotation      Hip external rotation       (Blank rows = not tested)   Comments:  Formal assessment deferred given time constraints and symptom irritability - grossly WNL B LE sagittal plane   LOWER EXTREMITY MMT:     MMT Right eval Left eval  Hip flexion      Hip abduction (modified sitting) 4- 4-  Hip internal rotation      Hip external rotation      Knee flexion      Knee extension       (Blank rows = not tested)  Comments: deferred further MMT testing d/t symptom irritability   LUMBAR SPECIAL TESTS:  + seated slump on RLE   FUNCTIONAL TESTS:  5xSTS deferred on this date due to time constraints and symptom irritability   Sit to stand: standard chair - B UE pushing up from chair, reduced fwd trunk lean, narrow BOS w knee valgus B, rigid trunk posture with ascent. Reduced eccentric control back into chair, controlled with UE support. Increased time and effort apparent   GAIT: Distance walked: within clinic Assistive device utilized: None Level of assistance: Complete Independence Comments: partial step through pattern grossly symmetrical B, reduced step length, reduced gait speed, reduced        TODAY'S TREATMENT  [facility] Adult PT Treatment:                                                DATE: 04/09/22 Aquatic therapy at [address] - therapeutic pool temp 92 degrees Pt enters building ambulating independently  Treatment took place in water 3.8 to  4 ft 8 in.feet deep depending upon activity.  Pt entered and exited the pool via stair and handrails independently  Pt pain level moderate at initiation of water walking. Patient entered water for aquatic therapy for first time and was introduced to  principles and therapeutic effects of water as they ambulated and acclimated to pool.  Therapeutic Exercise: Walking forward/backwards/side stepping At edge of pool, pt performed LE exercise: Hip abd/add x20 BIL (painful on Rt) Hip extension x20 BIL Hip ext/flex with knee straight x 20 BIL Squats 2x10 Self Care: Education on pain neuroscience and parasympathetic vs sympathetic nervious system   Pt requires the buoyancy of water for active assisted exercises with buoyancy supported for strengthening and AROM exercises. Hydrostatic pressure also supports joints by unweighting joint load by at least 50 % in 3-4 feet depth water. 80% in chest to neck deep water. Water will provide assistance with movement using the current and laminar flow while the buoyancy reduces weight bearing. Pt requires the viscosity of the water for resistance with strengthening exercises.   [facility] Adult PT Treatment:                                                DATE: 04/01/22 Neuromuscular re-ed: Adductor iso 2x12 Swiss ball press downs, seated, 2x10, cues for breath control and comfortable ROM Ta activation x12, cues for breath control and pacing Introduction to pain neuroscience   [facility] Adult PT Treatment:                                                DATE: 03/24/22 Therapeutic Exercise: Seated TA activation x10, tactile cues for contraction, education for HEP     PATIENT EDUCATION:  Education details: Significant time spent with education/discussion re: pt pain behaviors, pain neuroscience education, rationale for interventions, activity modification, and safety + appropriate HEP performance Person educated: Patient Education method: Explanation, Demonstration, Tactile cues, Verbal cues  Education comprehension: verbalized understanding, returned demonstration, verbal cues required, tactile cues required, and needs further education  HOME EXERCISE PROGRAM: Access Code: [email or web address] URL:  [email or web address] Date: 03/24/2022 Prepared by: Enis Slipper   Exercises - Seated Transversus Abdominis Bracing  - 1 x daily - 7 x weekly - 2 sets - 8 reps   ASSESSMENT:   CLINICAL IMPRESSION: Patient presents to her first aquatic PT session reporting moderate pain at the beginning of the session. Session today focused on proximal hip and core strengthening as well as general conditioning in the aquatic environment for use of buoyancy to offload joints and the viscosity of water as resistance during therapeutic exercise. Education provided on pain neuroscience and sympathetic vs. parasympathetic nervous system. Patient was able to tolerate all prescribed exercises in the aquatic environment with no adverse effects and reports moderate at the end of the session. Patient continues to benefit from skilled PT services on land and aquatic based and should be progressed as able to improve functional independence.    OBJECTIVE IMPAIRMENTS Abnormal gait, decreased activity tolerance, decreased endurance, decreased mobility, difficulty walking, decreased ROM, decreased strength, impaired perceived functional ability, impaired flexibility, improper body mechanics, postural dysfunction, and pain.    ACTIVITY LIMITATIONS carrying, lifting, bending, standing, squatting, stairs, transfers, dressing, and locomotion level   PARTICIPATION LIMITATIONS: meal prep, cleaning, laundry, driving, community activity, and occupation   PERSONAL FACTORS Past/current experiences, Time since onset of injury/illness/exacerbation, and 1-2 comorbidities: HTN, syncope  are also affecting patient's functional outcome.    REHAB POTENTIAL: Fair given chronicity and comorbidities   CLINICAL DECISION MAKING: Evolving/moderate complexity   EVALUATION COMPLEXITY: Moderate     GOALS: Goals reviewed with patient? No given time constraints   SHORT TERM GOALS: Target date: 04/21/2022   Pt will demonstrate  appropriate understanding and performance of initially prescribed HEP in order to facilitate improved independence with management of symptoms.  Baseline: HEP provided on eval Goal status: INITIAL   2. Pt will score improve greater than MCID on ODI in order to demonstrate improved perception of function due to symptoms.            Baseline: 23/50 raw score            Goal status: INITIAL     LONG TERM GOALS: Target date: 05/19/2022   Pt will improve greater than 2x MCID on ODI in order to demonstrate improved perception of functional status due to symptoms.  Baseline: 23/50 raw score  Goal status: INITIAL   2.  Pt will demonstrate equal to or greater than 75% lumbar flexion AROM with less than 2 point increase on NPS in order to demonstrate improved tolerance to lower body dressing.   Baseline: <25% lumbar flexion, severe pain Goal status: INITIAL   3.  Pt will demonstrate ability to navigate up to 12 stairs safely without use of rail in order to demonstrate improved safety with household navigation Baseline: moderate pain/difficulty reported with navigating stairs Goal status: INITIAL   4. Pt will perform single sit to stand without UE support and mechanics grossly within normal limits in order to demonstrate improved independence/safety with transfers.            Baseline: B UE pushing up from chair, reduced fwd trunk lean, narrow BOS w knee valgus B, rigid trunk posture with ascent. Reduced eccentric control back into chair, controlled with UE support. Increased time and effort apparent            Goal status: INITIAL      PLAN: PT FREQUENCY: 1x/week   PT DURATION: 8 weeks  PLANNED INTERVENTIONS: Therapeutic exercises, Therapeutic activity, Neuromuscular re-education, Balance training, Gait training, Patient/Family education, Self Care, Joint mobilization, Stair training, Aquatic Therapy, Dry Needling, Electrical stimulation, Spinal mobilization, Cryotherapy, Moist heat, Manual  therapy, and Re-evaluation.   PLAN FOR NEXT SESSION: review/update HEP, progress lumbar/hip exercises as able/appropriate. Initiate aquatic therapy.    Margarette Canada, PTA 04/09/22 4:07 PM

## 2022-04-09 ENCOUNTER — Ambulatory Visit: Payer: Medicaid Other

## 2022-04-09 DIAGNOSIS — M6281 Muscle weakness (generalized): Secondary | ICD-10-CM | POA: Diagnosis not present

## 2022-04-09 DIAGNOSIS — R2689 Other abnormalities of gait and mobility: Secondary | ICD-10-CM

## 2022-04-09 DIAGNOSIS — M5459 Other low back pain: Secondary | ICD-10-CM

## 2022-04-13 DIAGNOSIS — M545 Low back pain, unspecified: Secondary | ICD-10-CM | POA: Diagnosis not present

## 2022-04-15 NOTE — Therapy (Signed)
OUTPATIENT PHYSICAL THERAPY TREATMENT NOTE   Patient Name: Kirsten Walsh MRN: [id number] DOB:03/02/1961, 61 y.o., female 17 Date: 04/16/2022  PCP: Wells Guiles, DO   REFERRING PROVIDER: Nolene Ebbs, MD  END OF SESSION:   PT End of Session - 04/16/22 1609     Visit Number 4    Number of Visits 9    Date for PT Re-Evaluation 05/19/22    Authorization Type Medicaid/[employer]    Authorization Time Period 4 visits 03/30/22-05/15/22    Authorization - Visit Number 3    Authorization - Number of Visits 4    Progress Note Due on Visit 10    PT Start Time 1448    PT Stop Time 1856    PT Time Calculation (min) 45 min    Activity Tolerance Patient limited by pain;Patient tolerated treatment well    Behavior During Therapy [facility] for tasks assessed/performed               Past Medical History:  Diagnosis Date   Arthritis    Chest pain    Chronic lower back pain    "right side to mid back" (07/06/2017)   Family history of adverse reaction to anesthesia    "daughter did; not sure happened"   Gall stones    History of anaphylaxis 06/26/2014   History of kidney stones    Hypertension    Migraine    "qd" (07/06/2017)   Syncope and collapse 07/06/2017    sitting on the commode; developed nausea and passed out; hit head on shower and suffered a laceration; woke up in "a pool of blood"   Past Surgical History:  Procedure Laterality Date   CHONDROPLASTY Left 08/17/2019   Procedure: CHONDROPLASTY;  Surgeon: Earlie Server, MD;  Location: [place];  Service: Orthopedics;  Laterality: Left;   CYSTOSCOPY W/ STONE MANIPULATION  ~ 2006   INTRAUTERINE DEVICE INSERTION     "initially put in in 04/2002; changed prn" (02/14/2013)   KNEE ARTHROSCOPY WITH LATERAL MENISECTOMY  08/17/2019   Procedure: KNEE ARTHROSCOPY WITH LATERAL MENISECTOMY;  Surgeon: Earlie Server, MD;  Location: [place];  Service: Orthopedics;;   KNEE ARTHROSCOPY WITH  MEDIAL MENISECTOMY Left 08/17/2019   Procedure: KNEE ARTHROSCOPY WITH MEDIAL MENISECTOMY;  Surgeon: Earlie Server, MD;  Location: [place];  Service: Orthopedics;  Laterality: Left;   LAPAROSCOPIC CHOLECYSTECTOMY  ~ 2004   TOTAL KNEE ARTHROPLASTY Left 01/16/2021   Procedure: TOTAL KNEE ARTHROPLASTY;  Surgeon: Netta Cedars, MD;  Location: [facility];  Service: Orthopedics;  Laterality: Left;  with adductor canal   Patient Active Problem List   Diagnosis Date Noted   Seizures ([place]) 12/17/2021   Seizure-like activity ([place]) 12/15/2021   S/P TKR (total knee replacement), left 01/18/2021   H/O total knee replacement, left 01/16/2021   Cervical cancer screening 03/09/2020   Encounter for administration of vaccine 03/09/2020   Preoperative clearance 07/24/2019   Right arm pain 06/12/2019   Syncope 07/06/2017   Chronic daily headache 09/02/2015   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   Syncopal episodes 09/02/2015   Right arm weakness 08/05/2015   Edema of right arm and right leg 09/07/2014   Intractable migraine with status migrainosus 06/10/2014   Chronic migraine without aura, intractable, with status migrainosus    Bradycardia 02/18/2013   Left knee pain 01/16/2013   Cardiomyopathy 10/20/2011   Leg swelling 09/23/2011   Complex regional pain syndrome of right lower extremity 05/03/2011   Chronic pain 03/29/2011  NECK PAIN, RIGHT 02/03/2010   ALLERGIC RHINITIS 01/30/2009   DIZZINESS 07/16/2008   Nonintractable chronic migraine 04/26/2007   OBESITY, NOS 08/18/2006   HYPERTENSION, BENIGN SYSTEMIC 08/18/2006    REFERRING DIAG: REFERRING DIAG: 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  PAIN:  Are you having pain: yes, pt declines use of NPS, describes as mild-moderate at present  Location: B low back, some referral into R leg How would you describe your pain? Sharp pain, shoots into leg Best in past week: pt declines NPS Worst in past week: pt declines NPS, describes as severe at worst Aggravating factors: walking/standing , going up/down stairs, driving, bending/lifting Easing factors: changing positions, ice/heat, medications (variable levels of relief with modalities, depends on day)    OBJECTIVE: (objective measures completed at initial evaluation unless otherwise dated)   DIAGNOSTIC FINDINGS:  Per lumbar XR June 2023: IMPRESSION: 1. Central disc protrusion with slight superior migration at L5-S1, closely approximating and potentially affecting either of the descending S1 nerve roots as they course through the lateral recesses, slightly greater on the left. 2. Shallow left extraforaminal disc protrusion at L4-5, closely approximating and potentially irritating the exiting left L4 nerve root. 3. Right subarticular disc protrusion at L3-4 with resultant mild right lateral recess stenosis.   Per CT head/spine Aug 2023: IMPRESSION: 1. No acute intracranial abnormality. 2. No acute cervical spine fracture or subluxation.   PATIENT SURVEYS:  ODI 04/01/22: 23/50 raw score - indicative of moderate disability   SCREENING FOR RED FLAGS: Red flag questioning unremarkable   COGNITION:           Overall cognitive status: Within functional limits for tasks assessed                          SENSATION: WFL all extremities       POSTURE: guarded UE posture, excessive UT elevation bilat   PALPATION: Deferred on this date due to time constraints   LUMBAR ROM:    Active  A/PROM  eval  Flexion <25% p! Low back   Extension ~10% p!   Right lateral flexion    Left lateral flexion    Right rotation    Left rotation     (Blank rows = not tested) Comment: report of RLE tingling after lumbar testing, deferred further due to symptom irritability     LOWER EXTREMITY ROM:      Active  Right eval Left eval  Hip flexion      Hip extension      Hip internal rotation      Hip external rotation       (Blank rows = not tested)   Comments:  Formal assessment deferred given time constraints and symptom irritability - grossly WNL B LE sagittal plane   LOWER EXTREMITY MMT:     MMT Right eval Left eval  Hip flexion      Hip abduction (modified sitting) 4- 4-  Hip internal rotation      Hip external rotation      Knee flexion  Knee extension       (Blank rows = not tested)   Comments: deferred further MMT testing d/t symptom irritability   LUMBAR SPECIAL TESTS:  + seated slump on RLE   FUNCTIONAL TESTS:  5xSTS deferred on this date due to time constraints and symptom irritability   Sit to stand: standard chair - B UE pushing up from chair, reduced fwd trunk lean, narrow BOS w knee valgus B, rigid trunk posture with ascent. Reduced eccentric control back into chair, controlled with UE support. Increased time and effort apparent   GAIT: Distance walked: within clinic Assistive device utilized: None Level of assistance: Complete Independence Comments: partial step through pattern grossly symmetrical B, reduced step length, reduced gait speed, reduced        TODAY'S TREATMENT  [facility] Adult PT Treatment:                                                DATE: 04/16/2022 Aquatic therapy at [facility]- [address] - therapeutic pool temp 92 degrees Pt enters building ambulating independently  Treatment took place in water 3.8 to  4 ft 8 in.feet deep depending upon activity.  Pt entered and exited the pool via stair and handrails independently  Pt pain level mild-moderate at initiation of water  walking.  Therapeutic Exercise: Walking forward/backwards/side stepping Ai Chi postures 1-6 with cues for diaphragmatic breathing and education on sympathetic vs parasympathetic nervous system and pain neuroscience At edge of pool, pt performed LE exercise: Hip abd/add x20 BIL (painful on Rt) Hip extension x20 BIL Hip ext/flex with knee straight x 20 BIL  [facility] Adult PT Treatment:                                                DATE: 04/09/22 Aquatic therapy at [facility]- [address] - therapeutic pool temp 92 degrees Pt enters building ambulating independently  Treatment took place in water 3.8 to  4 ft 8 in.feet deep depending upon activity.  Pt entered and exited the pool via stair and handrails independently  Pt pain level moderate at initiation of water walking. Patient entered water for aquatic therapy for first time and was introduced to principles and therapeutic effects of water as they ambulated and acclimated to pool.  Therapeutic Exercise: Walking forward/backwards/side stepping At edge of pool, pt performed LE exercise: Hip abd/add x20 BIL (painful on Rt) Hip extension x20 BIL Hip ext/flex with knee straight x 20 BIL Squats 2x10 Self Care: Education on pain neuroscience and parasympathetic vs sympathetic nervious system   Pt requires the buoyancy of water for active assisted exercises with buoyancy supported for strengthening and AROM exercises. Hydrostatic pressure also supports joints by unweighting joint load by at least 50 % in 3-4 feet depth water. 80% in chest to neck deep water. Water will provide assistance with movement using the current and laminar flow while the buoyancy reduces weight bearing. Pt requires the viscosity of the water for resistance with strengthening exercises.   [facility] Adult PT Treatment:  DATE: 04/01/22 Neuromuscular re-ed: Adductor iso 2x12 Swiss ball press downs, seated, 2x10, cues for  breath control and comfortable ROM Ta activation x12, cues for breath control and pacing Introduction to pain neuroscience      PATIENT EDUCATION:  Education details: Significant time spent with education/discussion re: pt pain behaviors, pain neuroscience education, rationale for interventions, activity modification, and safety + appropriate HEP performance Person educated: Patient Education method: Explanation, Demonstration, Tactile cues, Verbal cues  Education comprehension: verbalized understanding, returned demonstration, verbal cues required, tactile cues required, and needs further education      HOME EXERCISE PROGRAM: Access Code: [email or web address] URL: [email or web address] Date: 03/24/2022 Prepared by: Fransisco Hertzavid Guthrie   Exercises - Seated Transversus Abdominis Bracing  - 1 x daily - 7 x weekly - 2 sets - 8 reps   ASSESSMENT:   CLINICAL IMPRESSION: Patient presents to aquatic PT session reporting moderate pain at beginning. Session today focused on proximal hip strengthening as well as Ai Chi to promote deep, diaphragmatic breathing and education on pain neuroscience in the aquatic environment for use of buoyancy to offload joints and the viscosity of water as resistance during therapeutic exercise. Patient was able to tolerate all prescribed exercises in the aquatic environment with no adverse effects. Patient continues to benefit from skilled PT services on land and aquatic based and should be progressed as able to improve functional independence.   OBJECTIVE IMPAIRMENTS Abnormal gait, decreased activity tolerance, decreased endurance, decreased mobility, difficulty walking, decreased ROM, decreased strength, impaired perceived functional ability, impaired flexibility, improper body mechanics, postural dysfunction, and pain.    ACTIVITY LIMITATIONS carrying, lifting, bending, standing, squatting, stairs, transfers, dressing, and locomotion level   PARTICIPATION  LIMITATIONS: meal prep, cleaning, laundry, driving, community activity, and occupation   PERSONAL FACTORS Past/current experiences, Time since onset of injury/illness/exacerbation, and 1-2 comorbidities: HTN, syncope  are also affecting patient's functional outcome.    REHAB POTENTIAL: Fair given chronicity and comorbidities   CLINICAL DECISION MAKING: Evolving/moderate complexity   EVALUATION COMPLEXITY: Moderate     GOALS: Goals reviewed with patient? No given time constraints   SHORT TERM GOALS: Target date: 04/21/2022   Pt will demonstrate appropriate understanding and performance of initially prescribed HEP in order to facilitate improved independence with management of symptoms.  Baseline: HEP provided on eval Goal status: INITIAL   2. Pt will score improve greater than MCID on ODI in order to demonstrate improved perception of function due to symptoms.            Baseline: 23/50 raw score            Goal status: INITIAL     LONG TERM GOALS: Target date: 05/19/2022   Pt will improve greater than 2x MCID on ODI in order to demonstrate improved perception of functional status due to symptoms.  Baseline: 23/50 raw score  Goal status: INITIAL   2.  Pt will demonstrate equal to or greater than 75% lumbar flexion AROM with less than 2 point increase on NPS in order to demonstrate improved tolerance to lower body dressing.   Baseline: <25% lumbar flexion, severe pain Goal status: INITIAL   3.  Pt will demonstrate ability to navigate up to 12 stairs safely without use of rail in order to demonstrate improved safety with household navigation Baseline: moderate pain/difficulty reported with navigating stairs Goal status: INITIAL   4. Pt will perform single sit to stand without UE support and mechanics grossly within normal limits in order to demonstrate improved independence/safety with  transfers.            Baseline: B UE pushing up from chair, reduced fwd trunk lean, narrow BOS w  knee valgus B, rigid trunk posture with ascent. Reduced eccentric control back into chair, controlled with UE support. Increased time and effort apparent            Goal status: INITIAL      PLAN: PT FREQUENCY: 1x/week   PT DURATION: 8 weeks   PLANNED INTERVENTIONS: Therapeutic exercises, Therapeutic activity, Neuromuscular re-education, Balance training, Gait training, Patient/Family education, Self Care, Joint mobilization, Stair training, Aquatic Therapy, Dry Needling, Electrical stimulation, Spinal mobilization, Cryotherapy, Moist heat, Manual therapy, and Re-evaluation.   PLAN FOR NEXT SESSION: review/update HEP, progress lumbar/hip exercises as able/appropriate. Initiate aquatic therapy.     Berta Minor, PTA 04/16/22 4:52 PM

## 2022-04-16 ENCOUNTER — Ambulatory Visit: Payer: Medicaid Other

## 2022-04-16 DIAGNOSIS — R2689 Other abnormalities of gait and mobility: Secondary | ICD-10-CM | POA: Diagnosis not present

## 2022-04-16 DIAGNOSIS — M5459 Other low back pain: Secondary | ICD-10-CM | POA: Diagnosis not present

## 2022-04-16 DIAGNOSIS — M6281 Muscle weakness (generalized): Secondary | ICD-10-CM

## 2022-04-20 ENCOUNTER — Ambulatory Visit: Payer: Medicaid Other | Admitting: Physical Therapy

## 2022-04-20 ENCOUNTER — Encounter: Payer: Self-pay | Admitting: Physical Therapy

## 2022-04-20 DIAGNOSIS — R2689 Other abnormalities of gait and mobility: Secondary | ICD-10-CM | POA: Diagnosis not present

## 2022-04-20 DIAGNOSIS — M5459 Other low back pain: Secondary | ICD-10-CM

## 2022-04-20 DIAGNOSIS — I10 Essential (primary) hypertension: Secondary | ICD-10-CM | POA: Diagnosis not present

## 2022-04-20 DIAGNOSIS — M545 Low back pain, unspecified: Secondary | ICD-10-CM | POA: Diagnosis not present

## 2022-04-20 DIAGNOSIS — Z6832 Body mass index (BMI) 32.0-32.9, adult: Secondary | ICD-10-CM | POA: Diagnosis not present

## 2022-04-20 DIAGNOSIS — M6281 Muscle weakness (generalized): Secondary | ICD-10-CM

## 2022-04-20 DIAGNOSIS — G43909 Migraine, unspecified, not intractable, without status migrainosus: Secondary | ICD-10-CM | POA: Diagnosis not present

## 2022-04-20 DIAGNOSIS — R110 Nausea: Secondary | ICD-10-CM | POA: Diagnosis not present

## 2022-04-20 DIAGNOSIS — Z79899 Other long term (current) drug therapy: Secondary | ICD-10-CM | POA: Diagnosis not present

## 2022-04-20 NOTE — Therapy (Addendum)
OUTPATIENT PHYSICAL THERAPY TREATMENT NOTE + NO VISIT DISCHARGE SUMMARY ADDENDUM   Patient Name: Kirsten Walsh MRN: [id number] DOB:03/28/1961, 61 y.o., female Today's Date: 04/20/2022  PCP: [name] Mattocks, DO   REFERRING PROVIDER: Fleet Contras, MD  END OF SESSION:   PT End of Session - 04/20/22 1633     Visit Number 5    Number of Visits 9    Date for PT Re-Evaluation 05/19/22    Authorization Type Medicaid/[employer]    Authorization Time Period 4 visits 03/30/22-05/15/22    Authorization - Visit Number 4    Authorization - Number of Visits 4    Progress Note Due on Visit 10    PT Start Time 1633    PT Stop Time 1723    PT Time Calculation (min) 50 min    Activity Tolerance Patient tolerated treatment well;No increased pain    Behavior During Therapy WFL for tasks assessed/performed                Past Medical History:  Diagnosis Date   Arthritis    Chest pain    Chronic lower back pain    "right side to mid back" (07/06/2017)   Family history of adverse reaction to anesthesia    "daughter did; not sure happened"   Gall stones    History of anaphylaxis 06/26/2014   History of kidney stones    Hypertension    Migraine    "qd" (07/06/2017)   Syncope and collapse 07/06/2017    sitting on the commode; developed nausea and passed out; hit head on shower and suffered a laceration; woke up in "a pool of blood"   Past Surgical History:  Procedure Laterality Date   CHONDROPLASTY Left 08/17/2019   Procedure: CHONDROPLASTY;  Surgeon: Frederico Hamman, MD;  Location: [facility];  Service: Orthopedics;  Laterality: Left;   CYSTOSCOPY W/ STONE MANIPULATION  ~ 2006   INTRAUTERINE DEVICE INSERTION     "initially put in in 04/2002; changed prn" (02/14/2013)   KNEE ARTHROSCOPY WITH LATERAL MENISECTOMY  08/17/2019   Procedure: KNEE ARTHROSCOPY WITH LATERAL MENISECTOMY;  Surgeon: Frederico Hamman, MD;  Location: [facility];  Service:  Orthopedics;;   KNEE ARTHROSCOPY WITH MEDIAL MENISECTOMY Left 08/17/2019   Procedure: KNEE ARTHROSCOPY WITH MEDIAL MENISECTOMY;  Surgeon: Frederico Hamman, MD;  Location: [facility];  Service: Orthopedics;  Laterality: Left;   LAPAROSCOPIC CHOLECYSTECTOMY  ~ 2004   TOTAL KNEE ARTHROPLASTY Left 01/16/2021   Procedure: TOTAL KNEE ARTHROPLASTY;  Surgeon: Beverely Low, MD;  Location: [facility];  Service: Orthopedics;  Laterality: Left;  with adductor canal   Patient Active Problem List   Diagnosis Date Noted   Seizures (HCC) 12/17/2021   Seizure-like activity (HCC) 12/15/2021   S/P TKR (total knee replacement), left 01/18/2021   H/O total knee replacement, left 01/16/2021   Cervical cancer screening 03/09/2020   Encounter for administration of vaccine 03/09/2020   Preoperative clearance 07/24/2019   Right arm pain 06/12/2019   Syncope 07/06/2017   Chronic daily headache 09/02/2015   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   Syncopal episodes 09/02/2015   Right arm weakness 08/05/2015   Edema of right arm and right leg 09/07/2014   Intractable migraine with status migrainosus 06/10/2014   Chronic migraine without aura, intractable, with status migrainosus    Bradycardia 02/18/2013   Left knee pain 01/16/2013   Cardiomyopathy 10/20/2011   Leg swelling 09/23/2011   Complex regional pain syndrome of right lower extremity 05/03/2011  Chronic pain 03/29/2011   NECK PAIN, RIGHT 02/03/2010   ALLERGIC RHINITIS 01/30/2009   DIZZINESS 07/16/2008   Nonintractable chronic migraine 04/26/2007   OBESITY, NOS 08/18/2006   HYPERTENSION, BENIGN SYSTEMIC 08/18/2006    REFERRING DIAG: REFERRING DIAG: 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 Otherwise pt states she is doing okay, feels aquatic therapy has been helping provide a means of exercising with reduced pain.     PAIN:  Are you having pain: yes, pt declines use of NPS, describes as mild-moderate at present  Location: B low back, some referral into R leg How would you describe your pain? Sharp pain, shoots into leg Best in past week: pt declines NPS Worst in past week: pt declines NPS, describes as severe at worst Aggravating factors: walking/standing , going up/down stairs, driving, bending/lifting Easing factors: changing positions, ice/heat, medications (variable levels of relief with modalities, depends on day)    OBJECTIVE: (objective measures completed at initial evaluation unless otherwise dated)   DIAGNOSTIC FINDINGS:  Per lumbar XR June 2023: IMPRESSION: 1. Central disc protrusion with slight superior migration at L5-S1, closely approximating and potentially affecting either of the descending S1 nerve roots as they course through the lateral recesses, slightly greater on the left. 2. Shallow left extraforaminal disc protrusion at L4-5, closely approximating and potentially irritating the exiting left L4 nerve root. 3. Right subarticular disc protrusion at L3-4 with resultant mild right lateral recess stenosis.   Per CT head/spine Aug 2023: IMPRESSION: 1. No acute intracranial abnormality. 2. No acute cervical spine fracture or subluxation.   PATIENT SURVEYS:  ODI 04/01/22: 23/50 raw score - indicative of moderate disability   SCREENING FOR RED FLAGS: Red flag questioning unremarkable   COGNITION:           Overall cognitive status: Within functional limits for tasks assessed                          SENSATION: WFL all extremities       POSTURE: guarded UE posture,  excessive UT elevation bilat   PALPATION: Deferred on this date due to time constraints   LUMBAR ROM:    Active  A/PROM  eval 04/20/22 AROM  Flexion <25% p! Low back  ~25% p! Able to get to knees  Extension ~10% p! <25% p!  Right lateral flexion     Left lateral flexion     Right rotation     Left rotation      (Blank rows = not tested) Comment: report of RLE tingling after lumbar testing, deferred further due to symptom irritability     LOWER EXTREMITY ROM:      Active  Right eval Left eval  Hip flexion      Hip extension      Hip internal rotation      Hip external rotation       (Blank rows = not tested)   Comments:  Formal assessment deferred given time constraints and symptom irritability - grossly WNL B LE sagittal plane   LOWER EXTREMITY MMT:  MMT Right eval Left eval  Hip flexion      Hip abduction (modified sitting) 4- 4-  Hip internal rotation      Hip external rotation      Knee flexion      Knee extension       (Blank rows = not tested)   Comments: deferred further MMT testing d/t symptom irritability   LUMBAR SPECIAL TESTS:  + seated slump on RLE   FUNCTIONAL TESTS:  5xSTS deferred on this date due to time constraints and symptom irritability   Sit to stand: standard chair - B UE pushing up from chair, reduced fwd trunk lean, narrow BOS w knee valgus B, rigid trunk posture with ascent. Reduced eccentric control back into chair, controlled with UE support. Increased time and effort apparent  04/20/22: STS standard chair, able to perform w/o UE support but does demo increased weight shift to R   GAIT: Distance walked: within clinic Assistive device utilized: None Level of assistance: Complete Independence Comments: partial step through pattern grossly symmetrical B, reduced step length, reduced gait speed, reduced        TODAY'S TREATMENT  [facility] Adult PT Treatment:                                                DATE: 04/20/22 Therapeutic  Exercise: TA activation x10  Swiss ball press down, seated, 2x12 Adductor isos 2x12, cues for form and posture Hip flexion isometrics, seated x8 BLE Significant time spent with education on exercise and role in pain physiology, progression of interventions, and ensuring good understanding of HEP  Modalities: Pt requests moist heat for low back at end of session, seated, no adverse events, reports excellent relief, time not billed   [facility] Adult PT Treatment:                                                DATE: 04/16/2022 Therapeutic Exercise: Walking forward/backwards/side stepping Ai Chi postures 1-6 with cues for diaphragmatic breathing and education on sympathetic vs parasympathetic nervous system and pain neuroscience At edge of pool, pt performed LE exercise: Hip abd/add x20 BIL (painful on Rt) Hip extension x20 BIL Hip ext/flex with knee straight x 20 BIL  [facility] Adult PT Treatment:                                                DATE: 04/09/22 Therapeutic Exercise: Walking forward/backwards/side stepping At edge of pool, pt performed LE exercise: Hip abd/add x20 BIL (painful on Rt) Hip extension x20 BIL Hip ext/flex with knee straight x 20 BIL Squats 2x10 Self Care: Education on pain neuroscience and parasympathetic vs sympathetic nervious system      PATIENT EDUCATION:  Education details: rationale for interventions, progress with PT thus far, HEP performance, pain physiology Person educated: Patient Education method: Explanation, Demonstration, Tactile cues, Verbal cues  Education comprehension: verbalized understanding, returned demonstration, verbal cues required, tactile cues required, and needs further education      HOME EXERCISE PROGRAM: Access Code: [email or web address] URL: [email or web address] Date: 04/20/2022 Prepared by: Fransisco Hertz  Program Notes Do "hooklying hip flexion isometrics" in sitting, as performed in clinic  Exercises - Seated  Transversus Abdominis Bracing  - 1 x daily - 7 x weekly - 2 sets - 8 reps - Seated Hip Adduction Isometrics with Ball  - 1 x daily - 7 x weekly - 2 sets - 12 reps - Hooklying Isometric Hip Flexion  - 1 x daily - 7 x weekly - 2 sets - 8 reps   ASSESSMENT:   CLINICAL IMPRESSION: Pt arrives and states she feels "okay" after visit with pain med MD today for migraine. Pt feels she is improving with exercise tolerance with aquatic therapy. Reviewed/updated HEP with good tolerance, education provided for safe home performance. Goals addressed as noted below - pt making progress as expected with only three visits since initial evaluation given the chronicity and severity of her symptoms, continues to have high pain levels and difficulty with daily tasks, although she does report benefit from aquatic therapy in particular. Reviewed/updated HEP as appropriate today, improved tolerance but continues to require rest breaks. Benefits from increased education on rationale for interventions and pain physiology. Pt requests moist heat prior to departure, reports excellent relief. Pt reports improved pain on departure compared to arrival, no adverse events. Pt departs today's session in no acute distress, all voiced questions/concerns addressed appropriately from PT perspective.       OBJECTIVE IMPAIRMENTS Abnormal gait, decreased activity tolerance, decreased endurance, decreased mobility, difficulty walking, decreased ROM, decreased strength, impaired perceived functional ability, impaired flexibility, improper body mechanics, postural dysfunction, and pain.    ACTIVITY LIMITATIONS carrying, lifting, bending, standing, squatting, stairs, transfers, dressing, and locomotion level   PARTICIPATION LIMITATIONS: meal prep, cleaning, laundry, driving, community activity, and occupation   PERSONAL FACTORS Past/current experiences, Time since onset of injury/illness/exacerbation, and 1-2 comorbidities: HTN, syncope  are  also affecting patient's functional outcome.    REHAB POTENTIAL: Fair given chronicity and comorbidities   CLINICAL DECISION MAKING: Evolving/moderate complexity   EVALUATION COMPLEXITY: Moderate     GOALS: Goals reviewed with patient? No given time constraints   SHORT TERM GOALS: Target date: 04/21/2022   Pt will demonstrate appropriate understanding and performance of initially prescribed HEP in order to facilitate improved independence with management of symptoms.  Baseline: HEP provided on eval 04/20/22: HEP updated today, requires cues for initial program Goal status: ONGOING   2. Pt will score improve greater than MCID on ODI in order to demonstrate improved perception of function due to symptoms.            Baseline: 23/50 raw score            Goal status: ONGOING     LONG TERM GOALS: Target date: 05/19/2022   Pt will improve greater than 2x MCID on ODI in order to demonstrate improved perception of functional status due to symptoms.  Baseline: 23/50 raw score  Goal status: ONGOING   2.  Pt will demonstrate equal to or greater than 75% lumbar flexion AROM with less than 2 point increase on NPS in order to demonstrate improved tolerance to lower body dressing.   Baseline: <25% lumbar flexion, severe pain 04/20/22: ~25%  w/ pain Goal status: ONGOING   3.  Pt will demonstrate ability to navigate up to 12 stairs safely without use of rail in order to demonstrate improved safety with household navigation Baseline: moderate pain/difficulty reported with navigating stairs 04/20/22: not assessed given time constraints Goal status: ONGOING   4. Pt will perform single  sit to stand without UE support and mechanics grossly within normal limits in order to demonstrate improved independence/safety with transfers.            Baseline: B UE pushing up from chair, reduced fwd trunk lean, narrow BOS w knee valgus B, rigid trunk posture with ascent. Reduced eccentric control back into  chair, controlled with UE support. Increased time and effort apparent  04/20/22: see description above, able to perform w/o UE support, abnormal mechanics            Goal status: PROGRESSING     PLAN: PT FREQUENCY: 1x/week   PT DURATION: 8 weeks   PLANNED INTERVENTIONS: Therapeutic exercises, Therapeutic activity, Neuromuscular re-education, Balance training, Gait training, Patient/Family education, Self Care, Joint mobilization, Stair training, Aquatic Therapy, Dry Needling, Electrical stimulation, Spinal mobilization, Cryotherapy, Moist heat, Manual therapy, and Re-evaluation.   PLAN FOR NEXT SESSION: review/update HEP, progress lumbar/hip exercises as able/appropriate. Continue aquatic therapy as able/appropriate. Pt interested in dry needling at some point    Ashley Murrainavid A Keontre Defino PT, DPT 04/20/2022 5:26 PM    PHYSICAL THERAPY DISCHARGE SUMMARY  Visits from Start of Care: 5  Current functional level related to goals / functional outcomes: Unable to assess, not seen in clinic since 04/20/22   Remaining deficits: Unable to assess, see above for most recent deficits   Education / Equipment: Unable to assess   Patient unable to agree to discharge, lack of follow up. Patient goals were  unable to be assessed . Patient is being discharged due to not returning since the last visit.   Ashley Murrainavid A Graciana Sessa PT, DPT 06/24/2022 10:36 AM

## 2022-04-21 DIAGNOSIS — Z419 Encounter for procedure for purposes other than remedying health state, unspecified: Secondary | ICD-10-CM | POA: Diagnosis not present

## 2022-04-23 DIAGNOSIS — G43909 Migraine, unspecified, not intractable, without status migrainosus: Secondary | ICD-10-CM | POA: Diagnosis not present

## 2022-04-23 DIAGNOSIS — K5904 Chronic idiopathic constipation: Secondary | ICD-10-CM | POA: Diagnosis not present

## 2022-04-23 DIAGNOSIS — K219 Gastro-esophageal reflux disease without esophagitis: Secondary | ICD-10-CM | POA: Diagnosis not present

## 2022-04-23 DIAGNOSIS — M5136 Other intervertebral disc degeneration, lumbar region: Secondary | ICD-10-CM | POA: Diagnosis not present

## 2022-05-06 DIAGNOSIS — M5412 Radiculopathy, cervical region: Secondary | ICD-10-CM | POA: Diagnosis not present

## 2022-05-06 DIAGNOSIS — M5416 Radiculopathy, lumbar region: Secondary | ICD-10-CM | POA: Diagnosis not present

## 2022-05-10 DIAGNOSIS — G43909 Migraine, unspecified, not intractable, without status migrainosus: Secondary | ICD-10-CM | POA: Diagnosis not present

## 2022-05-10 DIAGNOSIS — I10 Essential (primary) hypertension: Secondary | ICD-10-CM | POA: Diagnosis not present

## 2022-05-10 DIAGNOSIS — K219 Gastro-esophageal reflux disease without esophagitis: Secondary | ICD-10-CM | POA: Diagnosis not present

## 2022-05-10 DIAGNOSIS — M5136 Other intervertebral disc degeneration, lumbar region: Secondary | ICD-10-CM | POA: Diagnosis not present

## 2022-05-19 DIAGNOSIS — M545 Low back pain, unspecified: Secondary | ICD-10-CM | POA: Diagnosis not present

## 2022-05-19 DIAGNOSIS — Z6832 Body mass index (BMI) 32.0-32.9, adult: Secondary | ICD-10-CM | POA: Diagnosis not present

## 2022-05-19 DIAGNOSIS — G43909 Migraine, unspecified, not intractable, without status migrainosus: Secondary | ICD-10-CM | POA: Diagnosis not present

## 2022-05-19 DIAGNOSIS — Z79899 Other long term (current) drug therapy: Secondary | ICD-10-CM | POA: Diagnosis not present

## 2022-05-19 DIAGNOSIS — I10 Essential (primary) hypertension: Secondary | ICD-10-CM | POA: Diagnosis not present

## 2022-05-20 DIAGNOSIS — Z79899 Other long term (current) drug therapy: Secondary | ICD-10-CM | POA: Diagnosis not present

## 2022-05-21 DIAGNOSIS — M5416 Radiculopathy, lumbar region: Secondary | ICD-10-CM | POA: Diagnosis not present
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
## 2022-07-13 ENCOUNTER — Other Ambulatory Visit: Payer: Self-pay | Admitting: Internal Medicine

## 2022-07-14 LAB — LIPID PANEL
Cholesterol: 194 mg/dL (ref <200)
HDL: 90 mg/dL (ref >=50)
LDL Cholesterol (Calc): 90 mg/dL (calc)
Non-HDL Cholesterol (Calc): 104 mg/dL (calc) (ref <130)
Total CHOL/HDL Ratio: 2.2 (calc) (ref <5.0)
Triglycerides: 54 mg/dL (ref <150)

## 2022-07-14 LAB — COMPLETE METABOLIC PANEL WITH GFR
AG Ratio: 1.6 (calc) (ref 1.0–2.5)
ALT: 11 U/L (ref 6–29)
AST: 13 U/L (ref 10–35)
Albumin: 4.1 g/dL (ref 3.6–5.1)
Alkaline phosphatase (APISO): 76 U/L (ref 37–153)
BUN/Creatinine Ratio: 10 (calc) (ref 6–22)
BUN: 10 mg/dL (ref 7–25)
CO2: 25 mmol/L (ref 20–32)
Calcium: 9 mg/dL (ref 8.6–10.4)
Chloride: 108 mmol/L (ref 98–110)
Creat: 1.04 mg/dL — ABNORMAL HIGH (ref 0.50–1.03)
Globulin: 2.5 g/dL (calc) (ref 1.9–3.7)
Glucose, Bld: 83 mg/dL (ref 65–99)
Potassium: 3.8 mmol/L (ref 3.5–5.3)
Sodium: 142 mmol/L (ref 135–146)
Total Bilirubin: 0.5 mg/dL (ref 0.2–1.2)
Total Protein: 6.6 g/dL (ref 6.1–8.1)
eGFR: 62 mL/min/{1.73_m2} (ref >=60)

## 2022-07-14 LAB — CBC
HCT: 40.1 % (ref 35.0–45.0)
Hemoglobin: 13.4 g/dL (ref 11.7–15.5)
MCH: 30.3 pg (ref 27.0–33.0)
MCHC: 33.4 g/dL (ref 32.0–36.0)
MCV: 90.7 fL (ref 80.0–100.0)
MPV: 11.5 fL (ref 7.5–12.5)
Platelets: 263 10*3/uL (ref 140–400)
RBC: 4.42 10*6/uL (ref 3.80–5.10)
RDW: 12.9 % (ref 11.0–15.0)
WBC: 3.3 10*3/uL — ABNORMAL LOW (ref 3.8–10.8)

## 2022-07-14 LAB — TSH: TSH: 0.64 mIU/L (ref 0.40–4.50)

## 2022-07-14 LAB — VITAMIN D 25 HYDROXY (VIT D DEFICIENCY, FRACTURES): Vit D, 25-Hydroxy: 60 ng/mL (ref 30–100)

## 2022-07-21 ENCOUNTER — Emergency Department (HOSPITAL_BASED_OUTPATIENT_CLINIC_OR_DEPARTMENT_OTHER): Payer: Medicaid Other

## 2022-07-21 ENCOUNTER — Encounter (HOSPITAL_BASED_OUTPATIENT_CLINIC_OR_DEPARTMENT_OTHER): Payer: Self-pay | Admitting: Emergency Medicine

## 2022-07-21 ENCOUNTER — Other Ambulatory Visit: Payer: Self-pay

## 2022-07-21 ENCOUNTER — Emergency Department (HOSPITAL_BASED_OUTPATIENT_CLINIC_OR_DEPARTMENT_OTHER)
Admission: EM | Admit: 2022-07-21 | Discharge: 2022-07-21 | Disposition: A | Discharge status: Home / Self Care | Payer: Medicaid Other | Attending: Emergency Medicine | Admitting: Emergency Medicine

## 2022-07-21 DIAGNOSIS — Y92512 Supermarket, store or market as the place of occurrence of the external cause: Secondary | ICD-10-CM | POA: Insufficient documentation

## 2022-07-21 DIAGNOSIS — I10 Essential (primary) hypertension: Secondary | ICD-10-CM | POA: Diagnosis not present

## 2022-07-21 DIAGNOSIS — Z23 Encounter for immunization: Secondary | ICD-10-CM | POA: Insufficient documentation

## 2022-07-21 DIAGNOSIS — S99922A Unspecified injury of left foot, initial encounter: Secondary | ICD-10-CM | POA: Diagnosis present

## 2022-07-21 DIAGNOSIS — W450XXA Nail entering through skin, initial encounter: Secondary | ICD-10-CM | POA: Diagnosis not present

## 2022-07-21 DIAGNOSIS — R55 Syncope and collapse: Secondary | ICD-10-CM | POA: Diagnosis not present

## 2022-07-21 DIAGNOSIS — T148XXA Other injury of unspecified body region, initial encounter: Secondary | ICD-10-CM

## 2022-07-21 DIAGNOSIS — S91332A Puncture wound without foreign body, left foot, initial encounter: Secondary | ICD-10-CM | POA: Insufficient documentation

## 2022-07-21 DIAGNOSIS — G43809 Other migraine, not intractable, without status migrainosus: Secondary | ICD-10-CM | POA: Diagnosis not present

## 2022-07-21 MED ORDER — TETANUS-DIPHTH-ACELL PERTUSSIS 5-2.5-18.5 LF-MCG/0.5 IM SUSY
0.5000 mL | PREFILLED_SYRINGE | Freq: Once | INTRAMUSCULAR | Status: AC
  Administered 2022-07-21: 0.5 mL via INTRAMUSCULAR
  Filled 2022-07-21: qty 0.5

## 2022-07-21 MED ORDER — ONDANSETRON 4 MG PO TBDP
4.0000 mg | ORAL_TABLET | Freq: Once | ORAL | Status: AC
  Administered 2022-07-21: 4 mg via ORAL
  Filled 2022-07-21: qty 1

## 2022-07-21 MED ORDER — KETOROLAC TROMETHAMINE 30 MG/ML IJ SOLN
30.0000 mg | Freq: Once | INTRAMUSCULAR | Status: AC
  Administered 2022-07-21: 30 mg via INTRAMUSCULAR
  Filled 2022-07-21: qty 1

## 2022-07-21 MED ORDER — CIPROFLOXACIN HCL 500 MG PO TABS: 500.0000 mg | ORAL_TABLET | Freq: Two times a day (BID) | ORAL | 0 refills | Status: AC

## 2022-07-21 MED ORDER — CIPROFLOXACIN HCL 500 MG PO TABS
500.0000 mg | ORAL_TABLET | Freq: Once | ORAL | Status: AC
  Administered 2022-07-21: 500 mg via ORAL
  Filled 2022-07-21: qty 1
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
## 2022-07-21 NOTE — Discharge Instructions (Signed)
You were seen in the emergency room today by after having a nail go through your shoe and into your foot.  I am starting you on antibiotics.  Your x-ray looked normal.  If you notice worsening redness to the bottom of your foot or drainage you should return to the emergency department for reevaluation.  I also listed the name of an orthopedic surgeon on the form here for follow up. Please contact your primary care physician for close follow-up appointment the next 7 days.  Return to the ED with any new or suddenly worsening symptoms.
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
## 2022-07-30 ENCOUNTER — Other Ambulatory Visit: Payer: Self-pay

## 2022-07-30 ENCOUNTER — Emergency Department (HOSPITAL_BASED_OUTPATIENT_CLINIC_OR_DEPARTMENT_OTHER)
Admission: EM | Admit: 2022-07-30 | Discharge: 2022-07-30 | Disposition: A | Discharge status: Home / Self Care | Payer: Medicaid Other | Attending: Emergency Medicine | Admitting: Emergency Medicine

## 2022-07-30 DIAGNOSIS — G43709 Chronic migraine without aura, not intractable, without status migrainosus: Secondary | ICD-10-CM | POA: Insufficient documentation

## 2022-07-30 DIAGNOSIS — S91332D Puncture wound without foreign body, left foot, subsequent encounter: Secondary | ICD-10-CM | POA: Diagnosis not present

## 2022-07-30 DIAGNOSIS — X58XXXD Exposure to other specified factors, subsequent encounter: Secondary | ICD-10-CM | POA: Insufficient documentation

## 2022-07-30 MED ORDER — METOCLOPRAMIDE HCL 5 MG/ML IJ SOLN
10.0000 mg | Freq: Once | INTRAMUSCULAR | Status: AC
  Administered 2022-07-30: 10 mg via INTRAMUSCULAR
  Filled 2022-07-30: qty 2

## 2022-07-30 MED ORDER — KETOROLAC TROMETHAMINE 30 MG/ML IJ SOLN
15.0000 mg | Freq: Once | INTRAMUSCULAR | Status: AC
  Administered 2022-07-30: 15 mg via INTRAMUSCULAR
  Filled 2022-07-30: qty 1

## 2022-07-30 MED ORDER — DEXAMETHASONE SODIUM PHOSPHATE 4 MG/ML IJ SOLN
4.0000 mg | Freq: Once | INTRAMUSCULAR | Status: AC
  Administered 2022-07-30: 4 mg via INTRAMUSCULAR
  Filled 2022-07-30: qty 1

## 2022-07-30 NOTE — Discharge Instructions (Addendum)
Please use antibiotics as prescribed by your PCP.  We have provided you with a walking boot that you can use with the crutches that you have available at home to assist with ambulation so that you may return to work and normal activities as tolerated.  If you develop fever, worsening pain in the foot, new injury, or other concerns, please be reevaluated by your PCP or at the nearest emergency department.

## 2022-07-30 NOTE — ED Notes (Addendum)
Pt didn't want crutches stated she already had some in the car. PA  made aware.

## 2022-07-30 NOTE — ED Triage Notes (Signed)
Patient presents to ED via POV from home. Here with left foot pain. Here on 01/31 for same. Reports PCP sent her for a post op shoe.

## 2022-07-30 NOTE — ED Provider Notes (Signed)
[place] [facility] Provider Note   CSN: [id number] Arrival date & time: 07/30/22  1633     History  Chief Complaint  Patient presents with   Foot Pain    Kirsten Walsh is a 62 y.o. female who presents to the ED complaining of continued left foot pain with difficulty bearing weight.  Patient states that she was seen in the ED after stepping on a pin at [facility] which embedded in her foot.  Foreign body was removed from the foot and patient was placed on Cipro for infection prevention.  She followed up with her PCP who changed her antibiotic yesterday but she is unsure which antibiotic she was changed to.  She has been taking the antibiotics as prescribed.  She reports that her PCP gave her a postop shoe to wear, however, this is not controlling her pain with weightbearing and she is unable to return to work as a result.  She states that she contacted her PCP who told her that she would benefit from a cam boot, however, they do not have those in office so he suggested that she come to the ED to obtain this.  She states that she has crutches to use at home.  Also complains of a chronic typical migraine for her for which she request treatment while in the ED.  No fever, chills, nausea, vomiting, new injury, or other concerns at this time. No history of diabetes.     Home Medications Prior to Admission medications   Medication Sig Start Date End Date Taking? Authorizing Provider  Butalbital-APAP-Caffeine 50-325-40 MG capsule Take 1 capsule by mouth every 6 (six) hours as needed for headache (migraine). 09/04/20   [provider]  cetirizine (ZYRTEC) 10 MG tablet Take 10 mg by mouth daily as needed for allergies.    [provider]  cloNIDine (CATAPRES) 0.1 MG tablet TAKE 1 TABLET(0.1 MG) BY MOUTH TWICE DAILY Patient taking differently: Take 0.1 mg by mouth 2 (two) times daily. 08/28/18   Everrett Coombe, MD  Diclofenac Sodium 3 % GEL Apply 1  application topically 2 (two) times daily as needed (pain). 09/06/20   [provider]  EPINEPHrine 0.3 mg/0.3 mL IJ SOAJ injection Inject 0.3 mg into the muscle as needed for anaphylaxis. 09/18/20   [provider]  fluticasone (FLONASE) 50 MCG/ACT nasal spray Place 1 spray into both nostrils daily as needed for allergies or rhinitis.    [provider]  gabapentin (NEURONTIN) 300 MG capsule Take 300 mg by mouth 2 (two) times daily. 08/26/20   [provider]  hydrochlorothiazide (HYDRODIURIL) 25 MG tablet TAKE 1 TABLET(25 MG) BY MOUTH DAILY Patient taking differently: Take 25 mg by mouth daily. 08/28/18   Everrett Coombe, MD  ibuprofen (ADVIL) 800 MG tablet Take 800 mg by mouth every 8 (eight) hours as needed for moderate pain. 12/07/21   [provider]  lidocaine (XYLOCAINE) 5 % ointment Apply 1 Application topically daily as needed for moderate pain. 10/19/21   [provider]  methylPREDNISolone (MEDROL DOSEPAK) 4 MG TBPK tablet Use as directed. 12/19/21   Hosie Poisson, MD  ondansetron (ZOFRAN) 4 MG tablet Take 4 mg by mouth 3 (three) times daily as needed for nausea or vomiting.    [provider]  oxyCODONE (ROXICODONE) 15 MG immediate release tablet Take 15 mg by mouth every 6 (six) hours. 09/04/20   [provider]  pantoprazole (PROTONIX) 40 MG tablet Take 40 mg  by mouth in the morning.    [provider]  Phendimetrazine Tartrate 105 MG CP24 Take 105 mg by mouth daily as needed (weight loss). Last dose approx 12/22/20 per pt Patient not taking: Reported on 12/30/2021 09/21/20   [provider]  polyethylene glycol (MIRALAX / GLYCOLAX) 17 g packet Take 17 g by mouth daily as needed for moderate constipation.    [provider]  potassium chloride (KLOR-CON) 10 MEQ tablet Take 2 tablets (20 mEq total) by mouth daily. Patient not taking: Reported on 12/30/2021 02/20/20   Carollee Leitz, MD  promethazine (PHENERGAN)  25 MG tablet Take 1 tablet (25 mg total) by mouth every 6 (six) hours as needed for nausea or vomiting. 04/30/19   Wieters, Hallie C, PA-C  senna-docusate (SENOKOT-S) 8.6-50 MG tablet Take 2 tablets by mouth 2 (two) times daily. 12/19/21   Hosie Poisson, MD  SUMAtriptan (IMITREX) 100 MG tablet Take 100 mg by mouth every 2 (two) hours as needed for migraine. May repeat in 2 hours if headache persists or recurs.    [provider]  tiZANidine (ZANAFLEX) 4 MG capsule Take 1 capsule (4 mg total) by mouth at bedtime as needed for muscle spasms. 12/19/21   Hosie Poisson, MD  topiramate (TOPAMAX) 200 MG tablet Take 1 tablet (200 mg total) by mouth 2 (two) times daily. 12/19/21   Hosie Poisson, MD  traZODone (DESYREL) 100 MG tablet Take 100 mg by mouth at bedtime as needed for sleep. Patient not taking: Reported on 12/30/2021 12/07/21   [provider]  Vitamin D, Ergocalciferol, (DRISDOL) 1.25 MG (50000 UNIT) CAPS capsule Take 50,000 Units by mouth every Monday. 09/14/20   [provider]      Allergies    Apple, Aspirin, Daucus carota, and Haloperidol and related    Review of Systems   Review of Systems  All other systems reviewed and are negative.   Physical Exam Updated Vital Signs BP (!) 157/93 (BP Location: Right Arm)   Pulse 99   Temp 98.2 F (36.8 C) (Oral)   Resp 17   Ht 5' 5"$  (1.651 m)   Wt 82.6 kg   SpO2 100%   BMI 30.29 kg/m  Physical Exam Vitals and nursing note reviewed.  Constitutional:      General: She is not in acute distress.    Appearance: Normal appearance. She is not ill-appearing or toxic-appearing.  HENT:     Head: Normocephalic and atraumatic.     Mouth/Throat:     Mouth: Mucous membranes are moist.  Eyes:     Extraocular Movements: Extraocular movements intact.     Conjunctiva/sclera: Conjunctivae normal.  Cardiovascular:     Rate and Rhythm: Normal rate and regular rhythm.     Heart sounds: No murmur heard. Pulmonary:     Effort:  Pulmonary effort is normal.     Breath sounds: Normal breath sounds.  Abdominal:     General: Abdomen is flat.     Palpations: Abdomen is soft.     Tenderness: There is no abdominal tenderness.  Musculoskeletal:     Cervical back: Neck supple.     Right lower leg: No edema.     Left lower leg: No edema.     Comments: Healing punctate wound to plantar surface of L foot just below great toe with tenderness over wound, tissue is soft, no erythema, no drainage, no swelling, no significant skin changes or significant infection, foot warm and well perfused with 2+ DP and  PT pulse and normal sensation  Skin:    General: Skin is warm and dry.     Capillary Refill: Capillary refill takes less than 2 seconds.  Neurological:     General: No focal deficit present.     Mental Status: She is alert. Mental status is at baseline.  Psychiatric:        Behavior: Behavior normal.     ED Results / Procedures / Treatments   Labs (all labs ordered are listed, but only abnormal results are displayed) Labs Reviewed - No data to display  EKG None  Radiology No results found.  Procedures .Ortho Injury Treatment  Date/Time: 07/30/2022 5:08 PM  Performed by: Suzzette Righter, PA-C Authorized by: Suzzette Righter, PA-C   Consent:    Consent obtained:  Verbal   Consent given by:  Patient   Procedural risks discussed: wound.   Alternatives discussed:  No treatment and alternative treatmentInjury location: foot Location details: left foot Injury type: soft tissue Pre-procedure neurovascular assessment: neurovascularly intact Pre-procedure distal perfusion: normal Pre-procedure neurological function: normal Pre-procedure range of motion: normal Immobilization: CAM boot. Splint Applied by: ED Tech Post-procedure neurovascular assessment: post-procedure neurovascularly intact Post-procedure distal perfusion: normal Post-procedure neurological function: normal Post-procedure range of motion:  normal       Medications Ordered in ED Medications  ketorolac (TORADOL) 30 MG/ML injection 15 mg (15 mg Intramuscular Given 07/30/22 1736)  metoCLOPramide (REGLAN) injection 10 mg (10 mg Intramuscular Given 07/30/22 1736)  dexamethasone (DECADRON) injection 4 mg (4 mg Intramuscular Given 07/30/22 1736)    ED Course/ Medical Decision Making/ A&P                             Medical Decision Making  Medical Decision Making:   JAMICKA LENOX is a 62 y.o. female who presented to the ED today with left foot pain as detailed above.    External chart has been reviewed including recent ED visit. Complete initial physical exam performed, notably the patient was neurovascularly intact without signs of infection on exam.    Reviewed and confirmed nursing documentation for past medical history, family history, social history.    Initial Assessment:   With the patient's presentation of left foot pain, most likely diagnosis is mild cellulitis. Other diagnoses were considered including (but not limited to) retained foreign body, fracture, dislocation. These are considered less likely due to history of present illness and physical exam findings.   This is most consistent with an acute complicated illness  Initial Plan:  Physical examination as above Chart review of recent ED visit Previous negative x-ray of the left foot Immobilization with Cam boot and crutches Treatment for chronic migraine  Initial Study Results:    Radiology:  All images reviewed independently.  X-ray from visit on 1/31.  Agree with radiology report at this time.   DG Foot Complete Left  Result Date: 07/21/2022 CLINICAL DATA:  Puncture wound EXAM: LEFT FOOT - COMPLETE 3+ VIEW COMPARISON:  None Available. FINDINGS: There is no evidence of fracture or dislocation. Joint spaces are maintained. There is a plantar calcaneal spur. Soft tissues are unremarkable. No foreign body identified. IMPRESSION: 1. No acute findings.  Electronically Signed   By: Ronney Asters M.D.   On: 07/21/2022 19:37     Final Assessment and Plan:   This is a 62 year old female presenting to the ED for continued left foot pain after injuring it as described above  on 1/31.  Patient had metal including pin that went through the foot and was subsequently removed in the ED with placement on outpatient antibiotics.  X-ray at that time revealed no retained foreign body or other bony injury.  Patient with continued pain, also evaluated by PCP who placed her on a different antibiotic yesterday which she has been taking.  Still with some tenderness over the wounds but otherwise it appears to be healing well with no erythema, no swelling, no drainage, overlying tissue soft and wound is well-approximated. Neurovasularly intact. Pt states sent here by PCP for CAM boot as she is still having pain with weight bearing. She denies new injury or other symptoms apart from chronic migraine headache which is typical for her. Neurologically intact  on exam. Will treat migraine headache while here and CAM boot applied. States she will use crutches she has at home. Instructed to continue antibiotics as prescribed by PCP. All questions answered, strict ED return precautions given, and stable for discharge.    Clinical Impression:  1. Puncture wound of left foot, subsequent encounter   2. Chronic migraine without aura without status migrainosus, not intractable      Discharge           Final Clinical Impression(s) / ED Diagnoses Final diagnoses:  Puncture wound of left foot, subsequent encounter  Chronic migraine without aura without status migrainosus, not intractable    Rx / DC Orders ED Discharge Orders     None         Suzzette Righter, PA-C 07/30/22 1759    Fransico Meadow, MD 07/31/22 [phone]

## 2022-08-12 ENCOUNTER — Ambulatory Visit: Payer: Medicaid Other | Admitting: Podiatry

## 2022-08-25 ENCOUNTER — Ambulatory Visit (INDEPENDENT_AMBULATORY_CARE_PROVIDER_SITE_OTHER): Payer: Medicaid Other | Admitting: Podiatry

## 2022-08-25 ENCOUNTER — Ambulatory Visit (INDEPENDENT_AMBULATORY_CARE_PROVIDER_SITE_OTHER): Payer: Medicaid Other

## 2022-08-25 DIAGNOSIS — S91142A Puncture wound with foreign body of left great toe without damage to nail, initial encounter: Secondary | ICD-10-CM

## 2022-08-25 NOTE — Patient Instructions (Signed)
If you do not hear about the MRI, call [facility] to schedule your MRI at the below locations.  Please let me know if you have issues or problems scheduling the MRI   [facility] [place] [phone] [place], [place] [address]  [place] [address] [place], [place] [address]

## 2022-08-26 ENCOUNTER — Encounter: Payer: Self-pay | Admitting: Podiatry

## 2022-08-26 NOTE — Progress Notes (Signed)
  Subjective:  Patient ID: Kirsten Walsh, female    DOB: May 07, 1961,  MRN: [id number]  Chief Complaint  Patient presents with   Foot Injury    Puncture wound of left foot Referring Provider: Jeanie Cooks, EDWIN    62 y.o. female presents with the above complaint. History confirmed with patient.  She stepped on a metal spike attached to a piece of plastic while at a department store.  This happened in late January.  She went to the ER and x-rays were taken which were negative.  She was placed on ciprofloxacin, her primary transitioned her to cephalexin which she is still taking.  Objective:  Physical Exam: warm, good capillary refill, no trophic changes or ulcerative lesions, normal DP and PT pulses, normal sensory exam, and plantar medial left hallux there is a small hyperpigmented area that is tender, no evidence of open draining wound no signs of infection clinically.   Radiographs: Multiple views x-ray of the left foot: No overt bony destruction or fracture noted, there is some lucency in the plantar medial portion of the base of the phalanx Assessment:   1. Puncture wound of left great toe with foreign body without damage to nail, initial encounter      Plan:  Patient was evaluated and treated and all questions answered.  Expect that most of this is scar tissue possible neurovascular injury that will take some time to fully resolve.  My chief concern would be for possibility of infection of puncture wound to the bone there is an area of lucency noted on her x-rays.  I recommended evaluating with lab work and CBC ESR and CRP were ordered and she will complete this.  I also recommend an MRI to evaluate further.  There is no evidence of retained foreign body on her plain film x-rays today but a small piece of plastic possibly could be retained.  MRI will help [place] evaluate this as well. Return for after MRI to review, after lab work to review.

## 2022-09-01 ENCOUNTER — Encounter: Payer: Self-pay | Admitting: Podiatry

## 2022-09-11 ENCOUNTER — Ambulatory Visit
Admission: RE | Admit: 2022-09-11 | Discharge: 2022-09-11 | Disposition: A | Discharge status: Home / Self Care | Payer: Medicaid Other | Source: Ambulatory Visit | Attending: Podiatry | Admitting: Podiatry

## 2022-09-11 DIAGNOSIS — S91142A Puncture wound with foreign body of left great toe without damage to nail, initial encounter: Secondary | ICD-10-CM

## 2022-10-06 ENCOUNTER — Ambulatory Visit: Payer: Medicaid Other | Admitting: Podiatry

## 2022-11-01 ENCOUNTER — Encounter (HOSPITAL_COMMUNITY): Payer: Self-pay | Admitting: Emergency Medicine

## 2022-11-01 ENCOUNTER — Emergency Department (HOSPITAL_COMMUNITY)
Admission: EM | Admit: 2022-11-01 | Discharge: 2022-11-01 | Disposition: A | Discharge status: Home / Self Care | Payer: Medicaid Other | Attending: Emergency Medicine | Admitting: Emergency Medicine

## 2022-11-01 ENCOUNTER — Emergency Department (HOSPITAL_COMMUNITY): Payer: Medicaid Other

## 2022-11-01 ENCOUNTER — Other Ambulatory Visit: Payer: Self-pay

## 2022-11-01 DIAGNOSIS — W19XXXA Unspecified fall, initial encounter: Secondary | ICD-10-CM

## 2022-11-01 DIAGNOSIS — W01198A Fall on same level from slipping, tripping and stumbling with subsequent striking against other object, initial encounter: Secondary | ICD-10-CM | POA: Diagnosis not present

## 2022-11-01 DIAGNOSIS — Y92512 Supermarket, store or market as the place of occurrence of the external cause: Secondary | ICD-10-CM | POA: Insufficient documentation

## 2022-11-01 DIAGNOSIS — S0003XA Contusion of scalp, initial encounter: Secondary | ICD-10-CM | POA: Insufficient documentation

## 2022-11-01 DIAGNOSIS — R519 Headache, unspecified: Secondary | ICD-10-CM | POA: Diagnosis present

## 2022-11-01 MED ORDER — DIPHENHYDRAMINE HCL 50 MG/ML IJ SOLN
12.5000 mg | Freq: Once | INTRAMUSCULAR | Status: AC
  Administered 2022-11-01: 12.5 mg via INTRAVENOUS
  Filled 2022-11-01: qty 1

## 2022-11-01 MED ORDER — KETOROLAC TROMETHAMINE 30 MG/ML IJ SOLN
15.0000 mg | Freq: Once | INTRAMUSCULAR | Status: AC
  Administered 2022-11-01: 15 mg via INTRAVENOUS
  Filled 2022-11-01: qty 1

## 2022-11-01 MED ORDER — METOCLOPRAMIDE HCL 5 MG/ML IJ SOLN
10.0000 mg | Freq: Once | INTRAMUSCULAR | Status: AC
  Administered 2022-11-01: 10 mg via INTRAVENOUS
  Filled 2022-11-01: qty 2

## 2022-11-01 NOTE — ED Provider Notes (Signed)
[place] EMERGENCY DEPARTMENT AT [facility] Provider Note   CSN: [id number] Arrival date & time: 11/01/22  1853     History  Chief Complaint  Patient presents with   Kirsten Walsh is a 62 y.o. female.  Patient reports that she has had a headache for several days.  Patient reports that she was talking to her friend in a store and passed out hitting the back of her head.  Patient reports it is not uncommon for her to pass out when she has a headache.  Patient complains of soreness in the back of her head.  Patient did not have any chest pain she did not have any nausea or vomiting.  Patient reports that she has not been drinking as much fluid as usual.  Denies any neck pain.  The history is provided by the patient. No language interpreter was used.  Fall This is a new problem. The current episode started less than 1 hour ago. The problem occurs constantly. Associated symptoms include chest pain. Nothing aggravates the symptoms. Nothing relieves the symptoms. She has tried nothing for the symptoms. The treatment provided no relief.       Home Medications Prior to Admission medications   Medication Sig Start Date End Date Taking? Authorizing Provider  amitriptyline (ELAVIL) 50 MG tablet Take 1 tablet by mouth at bedtime as needed.    [provider]  Butalbital-APAP-Caffeine [phone] MG capsule Take 1 capsule by mouth every 6 (six) hours as needed for headache (migraine). 09/04/20   [provider]  cephALEXin (KEFLEX) 500 MG capsule Take 500 mg by mouth 3 (three) times daily. 07/29/22   [provider]  cetirizine (ZYRTEC) 10 MG tablet Take 10 mg by mouth daily as needed for allergies.    [provider]  cloNIDine (CATAPRES) 0.1 MG tablet TAKE 1 TABLET(0.1 MG) BY MOUTH TWICE DAILY Patient taking differently: Take 0.1 mg by mouth 2 (two) times daily. 08/28/18   Howard Pouch, MD  Diclofenac Sodium 3 % GEL Apply 1 application  topically 2 (two) times daily as needed (pain). 09/06/20   [provider]  EPINEPHrine 0.3 mg/0.3 mL IJ SOAJ injection Inject 0.3 mg into the muscle as needed for anaphylaxis. 09/18/20   [provider]  fluticasone (FLONASE) 50 MCG/ACT nasal spray Place 1 spray into both nostrils daily as needed for allergies or rhinitis.    [provider]  gabapentin (NEURONTIN) 300 MG capsule Take 300 mg by mouth 2 (two) times daily. 08/26/20   [provider]  hydrochlorothiazide (HYDRODIURIL) 25 MG tablet TAKE 1 TABLET(25 MG) BY MOUTH DAILY Patient taking differently: Take 25 mg by mouth daily. 08/28/18   Howard Pouch, MD  ibuprofen (ADVIL) 800 MG tablet Take 800 mg by mouth every 8 (eight) hours as needed for moderate pain. 12/07/21   [provider]  lidocaine (XYLOCAINE) 5 % ointment Apply 1 Application topically daily as needed for moderate pain. 10/19/21   [provider]  lisinopril-hydrochlorothiazide (ZESTORETIC) 20-12.5 MG tablet Take 1 tablet by mouth daily. 12/28/12   [provider]  methylPREDNISolone (MEDROL DOSEPAK) 4 MG TBPK tablet Use as directed. 12/19/21   Kathlen Mody, MD  naloxone [facility]) nasal spray 4 mg/0.1 mL SMARTSIG:Spray(s) Both Nares 08/24/22   [provider]  ondansetron (ZOFRAN) 4 MG tablet Take 4 mg by mouth 3 (three) times daily as needed for nausea or vomiting.    [provider]  oxyCODONE (ROXICODONE) 15  MG immediate release tablet Take 15 mg by mouth every 6 (six) hours. 09/04/20   [provider]  oxyCODONE-acetaminophen (PERCOCET) 10-325 MG tablet Take by mouth. 12/28/12   [provider]  pantoprazole (PROTONIX) 40 MG tablet Take 40 mg by mouth in the morning.    [provider]  Phendimetrazine Tartrate 105 MG CP24 Take 105 mg by mouth daily as needed (weight loss). Last dose approx 12/22/20 per pt Patient not taking: Reported on 12/30/2021 09/21/20   [provider]   phentermine (ADIPEX-P) 37.5 MG tablet Take by mouth. 07/13/22   [provider]  polyethylene glycol (MIRALAX / GLYCOLAX) 17 g packet Take 17 g by mouth daily as needed for moderate constipation.    [provider]  potassium chloride (KLOR-CON) 10 MEQ tablet Take 2 tablets (20 mEq total) by mouth daily. Patient not taking: Reported on 12/30/2021 02/20/20   Dana Allan, MD  promethazine (PHENERGAN) 25 MG tablet Take 1 tablet (25 mg total) by mouth every 6 (six) hours as needed for nausea or vomiting. 04/30/19   Wieters, Hallie C, PA-C  senna-docusate (SENOKOT-S) 8.6-50 MG tablet Take 2 tablets by mouth 2 (two) times daily. 12/19/21   Kathlen Mody, MD  SUMAtriptan (IMITREX) 100 MG tablet Take 100 mg by mouth every 2 (two) hours as needed for migraine. May repeat in 2 hours if headache persists or recurs.    [provider]  tiZANidine (ZANAFLEX) 4 MG capsule Take 1 capsule (4 mg total) by mouth at bedtime as needed for muscle spasms. 12/19/21   Kathlen Mody, MD  topiramate (TOPAMAX) 200 MG tablet Take 1 tablet (200 mg total) by mouth 2 (two) times daily. 12/19/21   Kathlen Mody, MD  traZODone (DESYREL) 100 MG tablet Take 100 mg by mouth at bedtime as needed for sleep. Patient not taking: Reported on 12/30/2021 12/07/21   [provider]  Vitamin D, Ergocalciferol, (DRISDOL) 1.25 MG (50000 UNIT) CAPS capsule Take 50,000 Units by mouth every Monday. 09/14/20   [provider]      Allergies    Apple, Aspirin, Daucus carota, and Haloperidol and related    Review of Systems   Review of Systems  Cardiovascular:  Positive for chest pain.  All other systems reviewed and are negative.   Physical Exam Updated Vital Signs BP (!) 160/100 (BP Location: Left Arm)   Pulse 66   Temp 99 F (37.2 C) (Oral)   Resp 16   Ht 5\' 5"  (1.651 m)   Wt 82.6 kg   SpO2 100%   BMI 30.30 kg/m  Physical Exam Vitals and nursing note reviewed.  Constitutional:      Appearance:  She is well-developed.  HENT:     Head: Normocephalic.     Nose: Nose normal.     Mouth/Throat:     Mouth: Mucous membranes are moist.  Cardiovascular:     Rate and Rhythm: Normal rate.  Pulmonary:     Effort: Pulmonary effort is normal.  Abdominal:     General: Abdomen is flat. There is no distension.  Musculoskeletal:        General: Normal range of motion.     Cervical back: Normal range of motion.  Skin:    General: Skin is warm.  Neurological:     General: No focal deficit present.     Mental Status: She is alert and oriented to person, place, and time.  Psychiatric:        Mood and Affect:  Mood normal.     ED Results / Procedures / Treatments   Labs (all labs ordered are listed, but only abnormal results are displayed) Labs Reviewed - No data to display  EKG None  Radiology CT Head Wo Contrast  Result Date: 11/01/2022 CLINICAL DATA:  Headaches for several days EXAM: CT HEAD WITHOUT CONTRAST TECHNIQUE: Contiguous axial images were obtained from the base of the skull through the vertex without intravenous contrast. RADIATION DOSE REDUCTION: This exam was performed according to the departmental dose-optimization program which includes automated exposure control, adjustment of the mA and/or kV according to patient size and/or use of iterative reconstruction technique. COMPARISON:  None Available. FINDINGS: Brain: No evidence of acute infarction, hemorrhage, hydrocephalus, extra-axial collection or mass lesion/mass effect. Cavum septum pellucidum is noted. Vascular: No hyperdense vessel or unexpected calcification. Skull: Normal. Negative for fracture or focal lesion. Sinuses/Orbits: No acute finding. Other: None. IMPRESSION: No acute intracranial abnormality noted. Electronically Signed   By: Alcide Clever M.D.   On: 11/01/2022 21:18    Procedures Procedures    Medications Ordered in ED Medications  metoCLOPramide (REGLAN) injection 10 mg (has no administration in time  range)  ketorolac (TORADOL) 30 MG/ML injection 15 mg (has no administration in time range)  diphenhydrAMINE (BENADRYL) injection 12.5 mg (has no administration in time range)    ED Course/ Medical Decision Making/ A&P                             Medical Decision Making Amount and/or Complexity of Data Reviewed Radiology: ordered.  Risk Prescription drug management.   MDM:  Ct head.  No acute abnormality   Pt given Iv benadryl, reglan and toradol.          Final Clinical Impression(s) / ED Diagnoses Final diagnoses:  Fall, initial encounter  Contusion of scalp, initial encounter  Acute nonintractable headache, unspecified headache type    Rx / DC Orders ED Discharge Orders     None     An After Visit Summary was printed and given to the patient.     Osie Cheeks 11/01/22 2200    Linwood Dibbles, MD 11/02/22 [phone]

## 2022-11-01 NOTE — Discharge Instructions (Addendum)
Follow up with your Physician for recheck  

## 2022-11-01 NOTE — ED Triage Notes (Signed)
Pt BIB EMS from store pt had witnessed fall. Pt c/o tenderness to back of head. Hx chronic back and left shoulder pain. Migraine x 4 days.

## 2022-11-01 NOTE — ED Notes (Signed)
Discharge summary/instructions reviewed with pt. Provided pt with opportunity to ask any questions, pt had no further questions or concerns. After-Visit Summary provided to pt. 

## 2022-11-02 ENCOUNTER — Ambulatory Visit (INDEPENDENT_AMBULATORY_CARE_PROVIDER_SITE_OTHER): Payer: Medicaid Other | Admitting: Podiatry

## 2022-11-02 DIAGNOSIS — S91142A Puncture wound with foreign body of left great toe without damage to nail, initial encounter: Secondary | ICD-10-CM

## 2022-11-02 NOTE — Progress Notes (Signed)
  Subjective:  Patient ID: Kirsten Walsh, female    DOB: May 15, 1961,  MRN: [id number]  Chief Complaint  Patient presents with   Toe Injury    Follow up MRI results    62 y.o. female presents with the above complaint. History confirmed with patient.  She returns for follow-up after completing the MRI  Objective:  Physical Exam: warm, good capillary refill, no trophic changes or ulcerative lesions, normal DP and PT pulses, normal sensory exam, and plantar medial left hallux no pain to palpation small area of hyperpigmentation remains  MRI 09/15/2022 IMPRESSION: 1. Negative for osteomyelitis or other acute abnormality. No foreign body is visualized. 2. Mild first MTP osteoarthritis.     Electronically Signed   By: Drusilla Kanner M.D.   On: 09/15/2022 09:25   Radiographs: Multiple views x-ray of the left foot: No overt bony destruction or fracture noted, there is some lucency in the plantar medial portion of the base of the phalanx Assessment:   1. Puncture wound of left great toe with foreign body without damage to nail, initial encounter      Plan:  Patient was evaluated and treated and all questions answered.  We reviewed the results of her MRI discussed that there is no retained foreign body or signs of infection or osteomyelitis.  Most of her symptoms have resolved.  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  She may return to see me as needed. Return if symptoms worsen or fail to improve.

## 2022-11-17 ENCOUNTER — Encounter (HOSPITAL_COMMUNITY): Payer: Self-pay

## 2022-11-17 ENCOUNTER — Other Ambulatory Visit: Payer: Self-pay

## 2022-11-17 ENCOUNTER — Emergency Department (HOSPITAL_COMMUNITY): Payer: Medicaid Other

## 2022-11-17 ENCOUNTER — Emergency Department (HOSPITAL_COMMUNITY)
Admission: EM | Admit: 2022-11-17 | Discharge: 2022-11-17 | Disposition: A | Discharge status: Home / Self Care | Payer: Medicaid Other | Attending: Emergency Medicine | Admitting: Emergency Medicine

## 2022-11-17 DIAGNOSIS — R918 Other nonspecific abnormal finding of lung field: Secondary | ICD-10-CM

## 2022-11-17 DIAGNOSIS — G43901 Migraine, unspecified, not intractable, with status migrainosus: Secondary | ICD-10-CM | POA: Diagnosis not present

## 2022-11-17 DIAGNOSIS — R55 Syncope and collapse: Secondary | ICD-10-CM | POA: Diagnosis not present

## 2022-11-17 LAB — CBC WITH DIFFERENTIAL/PLATELET
Abs Immature Granulocytes: 0.02 10*3/uL (ref 0.00–0.07)
Basophils Absolute: 0 10*3/uL (ref 0.0–0.1)
Basophils Relative: 1 %
Eosinophils Absolute: 0.1 10*3/uL (ref 0.0–0.5)
Eosinophils Relative: 2 %
HCT: 45.6 % (ref 36.0–46.0)
Hemoglobin: 14.6 g/dL (ref 12.0–15.0)
Immature Granulocytes: 0 %
Lymphocytes Relative: 42 %
Lymphs Abs: 1.9 10*3/uL (ref 0.7–4.0)
MCH: 30.2 pg (ref 26.0–34.0)
MCHC: 32 g/dL (ref 30.0–36.0)
MCV: 94.4 fL (ref 80.0–100.0)
Monocytes Absolute: 0.4 10*3/uL (ref 0.1–1.0)
Monocytes Relative: 9 %
Neutro Abs: 2.1 10*3/uL (ref 1.7–7.7)
Neutrophils Relative %: 46 %
Platelets: 212 10*3/uL (ref 150–400)
RBC: 4.83 MIL/uL (ref 3.87–5.11)
RDW: 14 % (ref 11.5–15.5)
WBC: 4.5 10*3/uL (ref 4.0–10.5)
nRBC: 0 % (ref 0.0–0.2)

## 2022-11-17 LAB — COMPREHENSIVE METABOLIC PANEL
ALT: 24 U/L (ref 0–44)
AST: 27 U/L (ref 15–41)
Albumin: 4 g/dL (ref 3.5–5.0)
Alkaline Phosphatase: 75 U/L (ref 38–126)
Anion gap: 13 (ref 5–15)
BUN: 9 mg/dL (ref 6–20)
CO2: 21 mmol/L — ABNORMAL LOW (ref 22–32)
Calcium: 9.5 mg/dL (ref 8.9–10.3)
Chloride: 105 mmol/L (ref 98–111)
Creatinine, Ser: 1.25 mg/dL — ABNORMAL HIGH (ref 0.44–1.00)
GFR, Estimated: 50 mL/min — ABNORMAL LOW (ref >60)
Glucose, Bld: 103 mg/dL — ABNORMAL HIGH (ref 70–99)
Potassium: 3.4 mmol/L — ABNORMAL LOW (ref 3.5–5.1)
Sodium: 139 mmol/L (ref 135–145)
Total Bilirubin: 0.4 mg/dL (ref 0.3–1.2)
Total Protein: 7.2 g/dL (ref 6.5–8.1)

## 2022-11-17 LAB — TROPONIN I (HIGH SENSITIVITY): Troponin I (High Sensitivity): 6 ng/L (ref <18)

## 2022-11-17 LAB — CBG MONITORING, ED: Glucose-Capillary: 114 mg/dL — ABNORMAL HIGH (ref 70–99)

## 2022-11-17 LAB — D-DIMER, QUANTITATIVE: D-Dimer, Quant: 0.83 ug/mL-FEU — ABNORMAL HIGH (ref 0.00–0.50)

## 2022-11-17 MED ORDER — METOCLOPRAMIDE HCL 5 MG/ML IJ SOLN
5.0000 mg | Freq: Once | INTRAMUSCULAR | Status: AC
  Administered 2022-11-17: 5 mg via INTRAVENOUS
  Filled 2022-11-17: qty 2

## 2022-11-17 MED ORDER — IOHEXOL 350 MG/ML SOLN
75.0000 mL | Freq: Once | INTRAVENOUS | Status: AC | PRN
  Administered 2022-11-17: 75 mL via INTRAVENOUS

## 2022-11-17 MED ORDER — KETOROLAC TROMETHAMINE 30 MG/ML IJ SOLN
15.0000 mg | Freq: Once | INTRAMUSCULAR | Status: AC
  Administered 2022-11-17: 15 mg via INTRAVENOUS
  Filled 2022-11-17: qty 1

## 2022-11-17 MED ORDER — SODIUM CHLORIDE 0.9 % IV SOLN: INTRAVENOUS | Status: DC

## 2022-11-17 MED ORDER — SODIUM CHLORIDE 0.9 % IV BOLUS
1000.0000 mL | Freq: Once | INTRAVENOUS | Status: AC
  Administered 2022-11-17: 1000 mL via INTRAVENOUS

## 2022-11-17 MED ORDER — DIPHENHYDRAMINE HCL 50 MG/ML IJ SOLN
12.5000 mg | Freq: Once | INTRAMUSCULAR | Status: AC
  Administered 2022-11-17: 12.5 mg via INTRAVENOUS
  Filled 2022-11-17: qty 1

## 2022-11-17 NOTE — Discharge Instructions (Signed)
As discussed follow-up with your primary care doctor for evaluation should you develop signs or symptoms of pneumonia. Please take your abortive medication for your migraine since our migraine cocktail did not significantly improve your headache.\  Get help right away if: You faint. You hit your head or are injured after fainting. You have any of these symptoms that may indicate trouble with your heart: Fast or irregular heartbeats (palpitations). Unusual pain in your chest, abdomen, or back. Shortness of breath. You have a seizure. You have a severe headache. You are confused. You have vision problems. You have severe weakness or trouble walking. You are bleeding from your mouth or rectum, or you have black or tarry stool. These symptoms may represent a serious problem that is an emergency. Do not wait to see if your symptoms will go away. Get medical help right away. Call your local emergency services (911 in the [place] Do not drive yourself to the hospital.

## 2022-11-17 NOTE — ED Triage Notes (Signed)
Pt BIB [facility] from work d/t a witnessed syncopal event. EMS reports multiple stories told about the event from people at her work & it is unknown if she sat herself to the ground or was assisted down. It was also told that when she tried to stand from the ground she fell back down. There was also multiple stories about whether she was "out" from 30 seconds to a couple of minutes. Pt A/Ox4 for EMS & reports Hx of chronic back pain,  migraines & syncopal events. 150/86, Sinus Brady 50 bpm, 98% RA, CBG 112, no PIV.

## 2022-11-17 NOTE — ED Provider Notes (Signed)
[name] EMERGENCY DEPARTMENT AT [facility] Provider Note   CSN: [id number] Arrival date & time: 11/17/22  1017     History  Chief Complaint  Patient presents with   Syncopal Event    Kirsten Walsh is a 62 y.o. female who presents emergency department with a chief complaint of syncope.  She has a past medical history of syncope, chronic pain and chronic headaches for which she takes daily Fioricet and Topamax.  Patient reports that she had a bad headache and was feeling lightheaded.  She asked one of her coworkers to go get a cup out of her car.  She states she was standing at the door directing them to her car.  They were returning which she states that she began feeling more lightheaded and then woke up with several people standing around her.  She does feel like she may be dehydrated due to dry mouth but states this could also be a side effect of her medications.  She states that her headache today is similar to all of her other headaches.  HPI     Home Medications Prior to Admission medications   Medication Sig Start Date End Date Taking? Authorizing Provider  amitriptyline (ELAVIL) 50 MG tablet Take 1 tablet by mouth at bedtime as needed.    [provider]  Butalbital-APAP-Caffeine [phone] MG capsule Take 1 capsule by mouth every 6 (six) hours as needed for headache (migraine). 09/04/20   [provider]  cephALEXin (KEFLEX) 500 MG capsule Take 500 mg by mouth 3 (three) times daily. 07/29/22   [provider]  cetirizine (ZYRTEC) 10 MG tablet Take 10 mg by mouth daily as needed for allergies.    [provider]  cloNIDine (CATAPRES) 0.1 MG tablet TAKE 1 TABLET(0.1 MG) BY MOUTH TWICE DAILY Patient taking differently: Take 0.1 mg by mouth 2 (two) times daily. 08/28/18   Howard Pouch, MD  Diclofenac Sodium 3 % GEL Apply 1 application topically 2 (two) times daily as needed (pain). 09/06/20   [provider]  EPINEPHrine 0.3  mg/0.3 mL IJ SOAJ injection Inject 0.3 mg into the muscle as needed for anaphylaxis. 09/18/20   [provider]  fluticasone (FLONASE) 50 MCG/ACT nasal spray Place 1 spray into both nostrils daily as needed for allergies or rhinitis.    [provider]  gabapentin (NEURONTIN) 300 MG capsule Take 300 mg by mouth 2 (two) times daily. 08/26/20   [provider]  hydrochlorothiazide (HYDRODIURIL) 25 MG tablet TAKE 1 TABLET(25 MG) BY MOUTH DAILY Patient taking differently: Take 25 mg by mouth daily. 08/28/18   Howard Pouch, MD  ibuprofen (ADVIL) 800 MG tablet Take 800 mg by mouth every 8 (eight) hours as needed for moderate pain. 12/07/21   [provider]  lidocaine (XYLOCAINE) 5 % ointment Apply 1 Application topically daily as needed for moderate pain. 10/19/21   [provider]  lisinopril-hydrochlorothiazide (ZESTORETIC) 20-12.5 MG tablet Take 1 tablet by mouth daily. 12/28/12   [provider]  methylPREDNISolone (MEDROL DOSEPAK) 4 MG TBPK tablet Use as directed. 12/19/21   Kathlen Mody, MD  naloxone [facility]) nasal spray 4 mg/0.1 mL SMARTSIG:Spray(s) Both Nares 08/24/22   [provider]  ondansetron (ZOFRAN) 4 MG tablet Take 4 mg by mouth 3 (three) times daily as needed for nausea or vomiting.    [provider]  oxyCODONE (ROXICODONE) 15 MG immediate release tablet Take 15 mg by mouth every 6 (six) hours. 09/04/20  [provider]  oxyCODONE-acetaminophen (PERCOCET) 10-325 MG tablet Take by mouth. 12/28/12   [provider]  pantoprazole (PROTONIX) 40 MG tablet Take 40 mg by mouth in the morning.    [provider]  Phendimetrazine Tartrate 105 MG CP24 Take 105 mg by mouth daily as needed (weight loss). Last dose approx 12/22/20 per pt Patient not taking: Reported on 12/30/2021 09/21/20   [provider]  phentermine (ADIPEX-P) 37.5 MG tablet Take by mouth. 07/13/22   [provider]  polyethylene  glycol (MIRALAX / GLYCOLAX) 17 g packet Take 17 g by mouth daily as needed for moderate constipation.    [provider]  potassium chloride (KLOR-CON) 10 MEQ tablet Take 2 tablets (20 mEq total) by mouth daily. Patient not taking: Reported on 12/30/2021 02/20/20   Dana Allan, MD  promethazine (PHENERGAN) 25 MG tablet Take 1 tablet (25 mg total) by mouth every 6 (six) hours as needed for nausea or vomiting. 04/30/19   Wieters, Hallie C, PA-C  senna-docusate (SENOKOT-S) 8.6-50 MG tablet Take 2 tablets by mouth 2 (two) times daily. 12/19/21   Kathlen Mody, MD  SUMAtriptan (IMITREX) 100 MG tablet Take 100 mg by mouth every 2 (two) hours as needed for migraine. May repeat in 2 hours if headache persists or recurs.    [provider]  tiZANidine (ZANAFLEX) 4 MG capsule Take 1 capsule (4 mg total) by mouth at bedtime as needed for muscle spasms. 12/19/21   Kathlen Mody, MD  topiramate (TOPAMAX) 200 MG tablet Take 1 tablet (200 mg total) by mouth 2 (two) times daily. 12/19/21   Kathlen Mody, MD  traZODone (DESYREL) 100 MG tablet Take 100 mg by mouth at bedtime as needed for sleep. Patient not taking: Reported on 12/30/2021 12/07/21   [provider]  Vitamin D, Ergocalciferol, (DRISDOL) 1.25 MG (50000 UNIT) CAPS capsule Take 50,000 Units by mouth every Monday. 09/14/20   [provider]      Allergies    Apple, Aspirin, Daucus carota, and Haloperidol and related    Review of Systems   Review of Systems  Physical Exam Updated Vital Signs BP 138/81   Pulse (!) 52   Temp 97.9 F (36.6 C)   Resp 14   SpO2 99%  Physical Exam  ED Results / Procedures / Treatments   Labs (all labs ordered are listed, but only abnormal results are displayed) Labs Reviewed  CBG MONITORING, ED - Abnormal; Notable for the following components:      Result Value   Glucose-Capillary 114 (*)    All other components within normal limits  CBC WITH DIFFERENTIAL/PLATELET  COMPREHENSIVE  METABOLIC PANEL  D-DIMER, QUANTITATIVE  TROPONIN I (HIGH SENSITIVITY)    EKG EKG Interpretation  Date/Time:  Wednesday Nov 17 2022 10:28:12 EDT Ventricular Rate:  52 PR Interval:  166 QRS Duration: 117 QT Interval:  463 QTC Calculation: 427 R Axis:   21 Text Interpretation: Sinus rhythm Consider right atrial enlargement Nonspecific intraventricular conduction delay Interpretation limited secondary to artifact Confirmed by Pricilla Loveless [phone]) on 11/17/2022 10:51:40 AM  Radiology No results found.  Procedures Procedures    Medications Ordered in ED Medications  sodium chloride 0.9 % bolus 1,000 mL (1,000 mLs Intravenous New Bag/Given 11/17/22 1129)    And  0.9 %  sodium chloride infusion (0 mLs Intravenous Hold 11/17/22 1139)    ED Course/ Medical Decision Making/ A&P  Medical Decision Making Kirsten Walsh is a 62 y.o. female who presents to ED for  evaluation of syncopal episode just prior to arrival.  She has a history of the same   On exam, patient is afebrile, hemodynamically stable with no focal neuro deficits and benign cardiopulmonary exam. EKG reassuring. Labs positive diet D-dimer however I ordered and interpreted images including CT angiogram which shows no acute findings.  She does have groundglass opacities, reviewed this with the patient.  No symptoms of pneumonia or difficulty breathing. Patient with  no personal or family history of sudden, young cardiac death.  No hx of CHF.  No complaints of chest pain or shortness of breath. Low risk per [facility] Syncope Rule.   Evaluation does not show pathology that would require ongoing emergent intervention or inpatient treatment. Will discharge to home with close PCP follow up. Reasons to return to ER were discussed.   Pt has remained hemodynamically stable throughout their time in the ED.   BP 138/77   Pulse 65   Temp 97.8 F (36.6 C) (Oral)   Resp 19   SpO2 100%   Patient  understands return precautions and follow up care. All questions answered.    Amount and/or Complexity of Data Reviewed Labs: ordered. Radiology: ordered.  Risk Prescription drug management.           Final Clinical Impression(s) / ED Diagnoses Final diagnoses:  Syncope, unspecified syncope type  Ground glass opacity present on imaging of lung  Status migrainosus    Rx / DC Orders ED Discharge Orders     None         Arthor Captain, PA-C 11/17/22 1558    Pricilla Loveless, MD 11/19/22 [phone]

## 2022-12-22 ENCOUNTER — Other Ambulatory Visit (HOSPITAL_COMMUNITY): Payer: Self-pay | Admitting: Internal Medicine

## 2022-12-22 DIAGNOSIS — M7989 Other specified soft tissue disorders: Secondary | ICD-10-CM

## 2022-12-24 ENCOUNTER — Ambulatory Visit (HOSPITAL_COMMUNITY)
Admission: RE | Admit: 2022-12-24 | Discharge: 2022-12-24 | Disposition: A | Discharge status: Home / Self Care | Payer: Medicaid Other | Source: Ambulatory Visit | Attending: Vascular Surgery | Admitting: Vascular Surgery

## 2022-12-24 DIAGNOSIS — M7989 Other specified soft tissue disorders: Secondary | ICD-10-CM | POA: Insufficient documentation

## 2022-12-27 ENCOUNTER — Encounter (HOSPITAL_COMMUNITY): Payer: Self-pay | Admitting: Emergency Medicine

## 2022-12-27 ENCOUNTER — Emergency Department (HOSPITAL_COMMUNITY)
Admission: EM | Admit: 2022-12-27 | Discharge: 2022-12-27 | Disposition: A | Discharge status: Home / Self Care | Payer: Medicaid Other | Attending: Emergency Medicine | Admitting: Emergency Medicine

## 2022-12-27 ENCOUNTER — Emergency Department (HOSPITAL_COMMUNITY): Payer: Medicaid Other

## 2022-12-27 DIAGNOSIS — R0789 Other chest pain: Secondary | ICD-10-CM

## 2022-12-27 DIAGNOSIS — I10 Essential (primary) hypertension: Secondary | ICD-10-CM | POA: Diagnosis not present

## 2022-12-27 DIAGNOSIS — R000 Tachycardia, unspecified: Secondary | ICD-10-CM | POA: Insufficient documentation

## 2022-12-27 LAB — CBC
HCT: 46.1 % — ABNORMAL HIGH (ref 36.0–46.0)
Hemoglobin: 14.8 g/dL (ref 12.0–15.0)
MCH: 29.9 pg (ref 26.0–34.0)
MCHC: 32.1 g/dL (ref 30.0–36.0)
MCV: 93.1 fL (ref 80.0–100.0)
Platelets: 253 10*3/uL (ref 150–400)
RBC: 4.95 MIL/uL (ref 3.87–5.11)
RDW: 13.5 % (ref 11.5–15.5)
WBC: 3.5 10*3/uL — ABNORMAL LOW (ref 4.0–10.5)
nRBC: 0 % (ref 0.0–0.2)

## 2022-12-27 LAB — BASIC METABOLIC PANEL
Anion gap: 10 (ref 5–15)
BUN: 14 mg/dL (ref 6–20)
CO2: 23 mmol/L (ref 22–32)
Calcium: 9.6 mg/dL (ref 8.9–10.3)
Chloride: 105 mmol/L (ref 98–111)
Creatinine, Ser: 1.26 mg/dL — ABNORMAL HIGH (ref 0.44–1.00)
GFR, Estimated: 49 mL/min — ABNORMAL LOW (ref >60)
Glucose, Bld: 112 mg/dL — ABNORMAL HIGH (ref 70–99)
Potassium: 2.9 mmol/L — ABNORMAL LOW (ref 3.5–5.1)
Sodium: 138 mmol/L (ref 135–145)

## 2022-12-27 LAB — MAGNESIUM: Magnesium: 2 mg/dL (ref 1.7–2.4)

## 2022-12-27 LAB — TROPONIN I (HIGH SENSITIVITY)
Troponin I (High Sensitivity): 5 ng/L (ref <18)
Troponin I (High Sensitivity): 6 ng/L (ref <18)

## 2022-12-27 LAB — HCG, SERUM, QUALITATIVE: Preg, Serum: NEGATIVE

## 2022-12-27 MED ORDER — KETOROLAC TROMETHAMINE 30 MG/ML IJ SOLN
30.0000 mg | Freq: Once | INTRAMUSCULAR | Status: AC
  Administered 2022-12-27: 30 mg via INTRAVENOUS
  Filled 2022-12-27: qty 1

## 2022-12-27 MED ORDER — IOHEXOL 350 MG/ML SOLN
80.0000 mL | Freq: Once | INTRAVENOUS | Status: AC | PRN
  Administered 2022-12-27: 80 mL via INTRAVENOUS

## 2022-12-27 MED ORDER — ONDANSETRON HCL 4 MG/2ML IJ SOLN
4.0000 mg | Freq: Once | INTRAMUSCULAR | Status: AC
  Administered 2022-12-27: 4 mg via INTRAVENOUS
  Filled 2022-12-27: qty 2

## 2022-12-27 MED ORDER — DIPHENHYDRAMINE HCL 50 MG/ML IJ SOLN
12.5000 mg | Freq: Once | INTRAMUSCULAR | Status: AC
  Administered 2022-12-27: 12.5 mg via INTRAVENOUS
  Filled 2022-12-27: qty 1

## 2022-12-27 MED ORDER — DEXAMETHASONE SODIUM PHOSPHATE 10 MG/ML IJ SOLN
10.0000 mg | Freq: Once | INTRAMUSCULAR | Status: AC
  Administered 2022-12-27: 10 mg via INTRAVENOUS
  Filled 2022-12-27: qty 1

## 2022-12-27 MED ORDER — IBUPROFEN 800 MG PO TABS: 800.0000 mg | ORAL_TABLET | Freq: Three times a day (TID) | ORAL | 0 refills | Status: AC | PRN

## 2022-12-27 MED ORDER — POTASSIUM CHLORIDE 10 MEQ/100ML IV SOLN
10.0000 meq | INTRAVENOUS | Status: AC
  Administered 2022-12-27 (×2): 10 meq via INTRAVENOUS
  Filled 2022-12-27 (×2): qty 100

## 2022-12-27 MED ORDER — SODIUM CHLORIDE (PF) 0.9 % IJ SOLN
INTRAMUSCULAR | Status: AC
  Filled 2022-12-27: qty 50

## 2022-12-27 MED ORDER — MORPHINE SULFATE (PF) 4 MG/ML IV SOLN
4.0000 mg | Freq: Once | INTRAVENOUS | Status: AC
  Administered 2022-12-27: 4 mg via INTRAVENOUS
  Filled 2022-12-27: qty 1

## 2022-12-27 NOTE — ED Provider Notes (Signed)
[place] EMERGENCY DEPARTMENT AT [facility] Provider Note   CSN: [id number] Arrival date & time: 12/27/22  1222     History  Chief Complaint  Patient presents with   Chest Pain    Kirsten Walsh is a 62 y.o. female with history of HTN, TKA in 2022, chronic migraine, presenting with left-sided chest pain began at 2 AM this morning.  The pain radiates to her left shoulder blade. Pain is worse with inspiration. She denies any abdominal pain, arm pain, jaw pain, shortness of breath.  Notes she is more dizzy than normal.  She was at her orthopedic appointment this morning and they noticed high blood pressure readings of 160/95 and combined with her complaint of chest pain advised her to report to the ER. Denies any prior cardiac events or history.    Chest Pain      Home Medications Prior to Admission medications   Medication Sig Start Date End Date Taking? Authorizing Provider  amitriptyline (ELAVIL) 50 MG tablet Take 1 tablet by mouth at bedtime as needed.    [provider]  Butalbital-APAP-Caffeine [phone] MG capsule Take 1 capsule by mouth every 6 (six) hours as needed for headache (migraine). 09/04/20   [provider]  cephALEXin (KEFLEX) 500 MG capsule Take 500 mg by mouth 3 (three) times daily. 07/29/22   [provider]  cetirizine (ZYRTEC) 10 MG tablet Take 10 mg by mouth daily as needed for allergies.    [provider]  cloNIDine (CATAPRES) 0.1 MG tablet TAKE 1 TABLET(0.1 MG) BY MOUTH TWICE DAILY Patient taking differently: Take 0.1 mg by mouth 2 (two) times daily. 08/28/18   Howard Pouch, MD  Diclofenac Sodium 3 % GEL Apply 1 application topically 2 (two) times daily as needed (pain). 09/06/20   [provider]  EPINEPHrine 0.3 mg/0.3 mL IJ SOAJ injection Inject 0.3 mg into the muscle as needed for anaphylaxis. 09/18/20   [provider]  fluticasone (FLONASE) 50 MCG/ACT nasal spray Place 1 spray into both  nostrils daily as needed for allergies or rhinitis.    [provider]  gabapentin (NEURONTIN) 300 MG capsule Take 300 mg by mouth 2 (two) times daily. 08/26/20   [provider]  hydrochlorothiazide (HYDRODIURIL) 25 MG tablet TAKE 1 TABLET(25 MG) BY MOUTH DAILY Patient taking differently: Take 25 mg by mouth daily. 08/28/18   Howard Pouch, MD  ibuprofen (ADVIL) 800 MG tablet Take 800 mg by mouth every 8 (eight) hours as needed for moderate pain. 12/07/21   [provider]  lidocaine (XYLOCAINE) 5 % ointment Apply 1 Application topically daily as needed for moderate pain. 10/19/21   [provider]  lisinopril-hydrochlorothiazide (ZESTORETIC) 20-12.5 MG tablet Take 1 tablet by mouth daily. 12/28/12   [provider]  methylPREDNISolone (MEDROL DOSEPAK) 4 MG TBPK tablet Use as directed. 12/19/21   Kathlen Mody, MD  naloxone [facility]) nasal spray 4 mg/0.1 mL SMARTSIG:Spray(s) Both Nares 08/24/22   [provider]  ondansetron (ZOFRAN) 4 MG tablet Take 4 mg by mouth 3 (three) times daily as needed for nausea or vomiting.    [provider]  oxyCODONE (ROXICODONE) 15 MG immediate release tablet Take 15 mg by mouth every 6 (six) hours. 09/04/20   [provider]  oxyCODONE-acetaminophen (PERCOCET) 10-325 MG tablet Take by mouth. 12/28/12   [provider]  pantoprazole (PROTONIX) 40 MG tablet Take 40 mg by mouth in the morning.    [provider]  Phendimetrazine  Tartrate 105 MG CP24 Take 105 mg by mouth daily as needed (weight loss). Last dose approx 12/22/20 per pt Patient not taking: Reported on 12/30/2021 09/21/20   [provider]  phentermine (ADIPEX-P) 37.5 MG tablet Take by mouth. 07/13/22   [provider]  polyethylene glycol (MIRALAX / GLYCOLAX) 17 g packet Take 17 g by mouth daily as needed for moderate constipation.    [provider]  potassium chloride (KLOR-CON) 10 MEQ tablet Take 2 tablets  (20 mEq total) by mouth daily. Patient not taking: Reported on 12/30/2021 02/20/20   Dana Allan, MD  promethazine (PHENERGAN) 25 MG tablet Take 1 tablet (25 mg total) by mouth every 6 (six) hours as needed for nausea or vomiting. 04/30/19   Wieters, Hallie C, PA-C  senna-docusate (SENOKOT-S) 8.6-50 MG tablet Take 2 tablets by mouth 2 (two) times daily. 12/19/21   Kathlen Mody, MD  SUMAtriptan (IMITREX) 100 MG tablet Take 100 mg by mouth every 2 (two) hours as needed for migraine. May repeat in 2 hours if headache persists or recurs.    [provider]  tiZANidine (ZANAFLEX) 4 MG capsule Take 1 capsule (4 mg total) by mouth at bedtime as needed for muscle spasms. 12/19/21   Kathlen Mody, MD  topiramate (TOPAMAX) 200 MG tablet Take 1 tablet (200 mg total) by mouth 2 (two) times daily. 12/19/21   Kathlen Mody, MD  traZODone (DESYREL) 100 MG tablet Take 100 mg by mouth at bedtime as needed for sleep. Patient not taking: Reported on 12/30/2021 12/07/21   [provider]  Vitamin D, Ergocalciferol, (DRISDOL) 1.25 MG (50000 UNIT) CAPS capsule Take 50,000 Units by mouth every Monday. 09/14/20   [provider]      Allergies    Apple, Aspirin, Daucus carota, and Haloperidol and related    Review of Systems   Review of Systems  Cardiovascular:  Positive for chest pain.    Physical Exam Updated Vital Signs BP (!) 126/95 (BP Location: Right Arm)   Pulse 94   Temp 98.4 F (36.9 C) (Oral)   Resp 18   Ht 5\' 5"  (1.651 m)   Wt 82 kg   SpO2 100%   BMI 30.08 kg/m  Physical Exam Vitals and nursing note reviewed.  Constitutional:      General: She is not in acute distress.    Comments: Uncomfortable appearing  Cardiovascular:     Rate and Rhythm: Regular rhythm. Tachycardia present.     Pulses:          Radial pulses are 2+ on the right side and 2+ on the left side.     Heart sounds: No murmur heard. Pulmonary:     Effort: Pulmonary effort is normal.     Breath sounds:  Normal breath sounds.     Comments: Patient not taking deep breaths due to pain Chest:     Chest wall: No deformity or tenderness.  Abdominal:     General: Bowel sounds are normal.     Palpations: Abdomen is soft.     Tenderness: There is no abdominal tenderness.  Neurological:     General: No focal deficit present.     Mental Status: She is alert.     ED Results / Procedures / Treatments   Labs (all labs ordered are listed, but only abnormal results are displayed) Labs Reviewed  BASIC METABOLIC PANEL - Abnormal; Notable for the following components:      Result Value   Potassium 2.9 (*)  Glucose, Bld 112 (*)    Creatinine, Ser 1.26 (*)    GFR, Estimated 49 (*)    All other components within normal limits  CBC - Abnormal; Notable for the following components:   WBC 3.5 (*)    HCT 46.1 (*)    All other components within normal limits  HCG, SERUM, QUALITATIVE  TROPONIN I (HIGH SENSITIVITY)  TROPONIN I (HIGH SENSITIVITY)    EKG EKG Interpretation Date/Time:  Monday December 27 2022 12:37:05 EDT Ventricular Rate:  91 PR Interval:  146 QRS Duration:  97 QT Interval:  389 QTC Calculation: 479 R Axis:   19  Text Interpretation: Sinus rhythm Consider right atrial enlargement Confirmed by Alona Bene [phone]) on 12/27/2022 1:37:24 PM  Radiology DG Chest 2 View  Result Date: 12/27/2022 CLINICAL DATA:  Left chest pain EXAM: CHEST - 2 VIEW COMPARISON:  04/05/2019 FINDINGS: Transverse diameter of heart is within normal limits. Thoracic aorta is tortuous and ectatic. Lung fields are clear of any infiltrates or pulmonary edema. There is no pleural effusion or pneumothorax. IMPRESSION: No active cardiopulmonary disease. Electronically Signed   By: Ernie Avena M.D.   On: 12/27/2022 13:27    Procedures Procedures    Medications Ordered in ED Medications  potassium chloride 10 mEq in 100 mL IVPB (has no administration in time range)    ED Course/ Medical Decision Making/  A&P                             Medical Decision Making Amount and/or Complexity of Data Reviewed Labs: ordered. Radiology: ordered.   62 y.o. female with pertinent past medical history of HTN, TKA, presents to the ED for concern of left chest pain since 2am this morning   Differential diagnosis includes but is not limited to PE, aortic dissection, ACS  ED Course:  Patient with pleuritic chest pain that radiates to the left shoulder blade. Uncomfortable appearing and avoiding taking deep breaths.  Talking in full sentences.  She has an elevated heart rate in the 90s upon examination.  Blood pressure recorded here elevated at 126/95, she reports her blood pressure taken at the orthopedic office was 160/95. She does have a history of HTN on medication. Radial pulses equal and 2+ bilaterally. Discussed case with Dr. Jacqulyn Bath who recommends CTA for PE study over dissection study at this time.  Patient started on IV potassium give K of 2.9 here    Impression: Chest pain  Disposition:  Patient handed off to PA Maxwell Marion who will follow up CTA to rule out PE, will follow up on second troponin. Patient may need dissection study if PE study unremarkable.   Lab Tests: I Ordered, and personally interpreted labs.  The pertinent results include:   Troponin of 5.  Awaiting repeat troponin BMP with potassium of 2.9, creatinine 1.26, this seems to be consistent with baseline, GFR 49 Serum pregnancy negative  Imaging Studies ordered: I ordered imaging studies including CXR, CTA Chest  I independently visualized the imaging with scope of interpretation limited to determining acute life threatening conditions related to emergency care.  CXR with no consolidations, pleural effusions, or pneumothorax Awaiting CTA chest I agree with the radiologist interpretation   Cardiac Monitoring: / EKG: The patient was maintained on a cardiac monitor.  I personally viewed and interpreted the EKG which showed  an underlying rhythm of: NSR   Consultations Obtained: None    Co morbidities that complicate  the patient evaluation  HTN  Social Determinants of Health:  Unknown              Final Clinical Impression(s) / ED Diagnoses Final diagnoses:  Other chest pain    Rx / DC Orders ED Discharge Orders     None         Arabella Merles, PA-C 12/27/22 1525    Long, Arlyss Repress, MD 12/28/22 [phone]

## 2022-12-27 NOTE — ED Provider Notes (Signed)
  Physical Exam  BP (!) 126/95 (BP Location: Right Arm)   Pulse 94   Temp 98.4 F (36.9 C) (Oral)   Resp 18   Ht 5\' 5"  (1.651 m)   Wt 82 kg   SpO2 100%   BMI 30.08 kg/m   Physical Exam Vitals and nursing note reviewed.  Constitutional:      Appearance: Normal appearance.  HENT:     Head: Normocephalic and atraumatic.     Mouth/Throat:     Mouth: Mucous membranes are moist.  Eyes:     Conjunctiva/sclera: Conjunctivae normal.     Pupils: Pupils are equal, round, and reactive to light.  Cardiovascular:     Rate and Rhythm: Normal rate and regular rhythm.     Pulses: Normal pulses.     Heart sounds: Normal heart sounds.  Pulmonary:     Effort: Pulmonary effort is normal.     Breath sounds: Normal breath sounds.  Abdominal:     Palpations: Abdomen is soft.     Tenderness: There is no abdominal tenderness.  Skin:    General: Skin is warm and dry.     Findings: No rash.  Neurological:     General: No focal deficit present.     Mental Status: She is alert.  Psychiatric:        Mood and Affect: Mood normal.        Behavior: Behavior normal.     Procedures  Procedures  ED Course / MDM    Medical Decision Making Amount and/or Complexity of Data Reviewed Labs: ordered. Radiology: ordered.  Risk Prescription drug management.   Took over patient care at time of shift change. She is a 62 year old female with stabbing chest pain that radiates to her back. Symptoms started this morning and are worse with deep breath. Denies alleviating factors.  CT PE Study was ordered an negative. Able to assess aorta, which did not show signs of dissection. Initial and repeat troponin of 2.  Potassium of 2.9 - IV Potassium given. Relayed results to patient. Patient reports improvement of chest pain but persistent migraine. Headache cocktail (Benadryl, Toradol, Zofran, and Decadron) ordered.  On reevaluation, patient feels better and is safe for discharge home. Return precautions  provided. Patient already takes oral Potassium supplements daily, so I wont discharge her home with any for hypokalemia. Prescription for Ibuprofen 800 mg sent to pharmacy - patient denies allergic reaction to this medication.   Maxwell Marion, PA-C 12/27/22 2045    Gloris Manchester, MD 12/28/22 0005

## 2022-12-27 NOTE — Discharge Instructions (Addendum)
As discussed, your labs and imaging are reassuring. No heart attack, pulmonary embolism, or aortic dissection suspected. Take Ibuprofen every 4 hours as needed for chest pain.  Call your PCP and follow up for your chest wall pain as well as your low potassium.  Get help right away if: You feel sick to your stomach (nauseous) or you throw up (vomit). You feel sweaty or light-headed. You have a cough with mucus from your lungs (sputum) or you cough up blood. You are short of breath.

## 2022-12-27 NOTE — ED Triage Notes (Signed)
Pt endorses left sided sharp pains that started around 2 am when waking up to use the bathroom. Also having migraine, reports migraines every day but heat worsens them. Sharp pains make it hard to breathe.

## 2022-12-29 ENCOUNTER — Other Ambulatory Visit: Payer: Self-pay | Admitting: Internal Medicine

## 2022-12-30 LAB — EXTRA LAV TOP TUBE

## 2022-12-30 LAB — BASIC METABOLIC PANEL WITH GFR
BUN: 9 mg/dL (ref 7–25)
CO2: 23 mmol/L (ref 20–32)
Calcium: 9.3 mg/dL (ref 8.6–10.4)
Chloride: 108 mmol/L (ref 98–110)
Creat: 0.99 mg/dL (ref 0.50–1.03)
Glucose, Bld: 109 mg/dL — ABNORMAL HIGH (ref 65–99)
Potassium: 3.5 mmol/L (ref 3.5–5.3)
Sodium: 141 mmol/L (ref 135–146)
eGFR: 66 mL/min/{1.73_m2} (ref >=60)

## 2022-12-30 LAB — MAGNESIUM: Magnesium: 2.1 mg/dL (ref 1.5–2.5)

## 2023-10-17 ENCOUNTER — Encounter (HOSPITAL_COMMUNITY): Payer: Self-pay | Admitting: *Deleted

## 2023-10-17 ENCOUNTER — Ambulatory Visit (HOSPITAL_COMMUNITY): Admission: EM | Admit: 2023-10-17 | Discharge: 2023-10-17 | Disposition: A | Discharge status: Home / Self Care

## 2023-10-17 DIAGNOSIS — S50861A Insect bite (nonvenomous) of right forearm, initial encounter: Secondary | ICD-10-CM | POA: Diagnosis not present

## 2023-10-17 DIAGNOSIS — R2231 Localized swelling, mass and lump, right upper limb: Secondary | ICD-10-CM | POA: Diagnosis not present

## 2023-10-17 DIAGNOSIS — G43811 Other migraine, intractable, with status migrainosus: Secondary | ICD-10-CM

## 2023-10-17 DIAGNOSIS — W57XXXA Bitten or stung by nonvenomous insect and other nonvenomous arthropods, initial encounter: Secondary | ICD-10-CM | POA: Diagnosis not present

## 2023-10-17 MED ORDER — SUMATRIPTAN SUCCINATE 6 MG/0.5ML ~~LOC~~ SOLN
6.0000 mg | Freq: Once | SUBCUTANEOUS | Status: AC
  Administered 2023-10-17: 6 mg via SUBCUTANEOUS

## 2023-10-17 MED ORDER — ONDANSETRON HCL 4 MG/2ML IJ SOLN
INTRAMUSCULAR | Status: AC
  Filled 2023-10-17: qty 2

## 2023-10-17 MED ORDER — DEXAMETHASONE SODIUM PHOSPHATE 10 MG/ML IJ SOLN
INTRAMUSCULAR | Status: AC
  Filled 2023-10-17: qty 1

## 2023-10-17 MED ORDER — SUMATRIPTAN SUCCINATE 6 MG/0.5ML ~~LOC~~ SOLN
SUBCUTANEOUS | Status: AC
  Filled 2023-10-17: qty 0.5

## 2023-10-17 MED ORDER — ONDANSETRON HCL 4 MG/2ML IJ SOLN
4.0000 mg | Freq: Once | INTRAMUSCULAR | Status: AC
  Administered 2023-10-17: 4 mg via INTRAMUSCULAR

## 2023-10-17 MED ORDER — DEXAMETHASONE SODIUM PHOSPHATE 10 MG/ML IJ SOLN
10.0000 mg | Freq: Once | INTRAMUSCULAR | Status: AC
  Administered 2023-10-17: 10 mg via INTRAMUSCULAR

## 2023-10-17 NOTE — ED Provider Notes (Signed)
 [facility]    CSN: [id number] Arrival date & time: 10/17/23  1408      History   Chief Complaint Chief Complaint  Patient presents with   Insect Bite   Migraine    HPI Kirsten Walsh is a 63 y.o. female.  Initially presented for insect bite Occurred yesterday on right lower arm. Reporting area of redness and swelling, tender to touch. She put ice pack on this morning that helped some. No swelling of lips or tongue, shortness of breath, wheezing, NV, rash.  However she also reports migraine. 10/10 today. History of chronic daily headache, basilar migraine, intractable migraine. Additional history of seizures. She does follow with neurology. Last seen 1 month ago. Is prescribed imitrex , propanolol, and Fioricet . Also newly on Lyrica  as of 2 months ago Reports took phenergan  early this morning and Fioricet  around 9:30 AM. She gets more severe migraines around seasonal changes, and thinks pollen is cause of worsening migraine today. Had an episode of emesis this morning, which does occur with her migraines. No vision changes, weakness, numbness/tingling.  Past Medical History:  Diagnosis Date   Arthritis    Chest pain    Chronic lower back pain    "right side to mid back" (07/06/2017)   Family history of adverse reaction to anesthesia    "daughter did; not sure happened"   Gall stones    History of anaphylaxis 06/26/2014   History of kidney stones    Hypertension    Migraine    "qd" (07/06/2017)   Syncope and collapse 07/06/2017    sitting on the commode; developed nausea and passed out; hit head on shower and suffered a laceration; woke up in "a pool of blood"    Patient Active Problem List   Diagnosis Date Noted   Seizures (HCC) 12/17/2021   Seizure-like activity (HCC) 12/15/2021   S/P TKR (total knee replacement), left 01/18/2021   H/O total knee replacement, left 01/16/2021   Cervical cancer screening 03/09/2020   Encounter for administration of  vaccine 03/09/2020   Preoperative clearance 07/24/2019   Right arm pain 06/12/2019   Syncope 07/06/2017   Chronic daily headache 09/02/2015   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   Syncopal episodes 09/02/2015   Right arm weakness 08/05/2015   Edema of right arm and right leg 09/07/2014   Intractable migraine with status migrainosus 06/10/2014   Chronic migraine without aura, intractable, with status migrainosus    Bradycardia 02/18/2013   Left knee pain 01/16/2013   Cardiomyopathy (HCC) 10/20/2011   Leg swelling 09/23/2011   Complex regional pain syndrome of right lower extremity 05/03/2011   Chronic pain 03/29/2011   NECK PAIN, RIGHT 02/03/2010   ALLERGIC RHINITIS 01/30/2009   DIZZINESS 07/16/2008   Nonintractable chronic migraine 04/26/2007   OBESITY, NOS 08/18/2006   HYPERTENSION, BENIGN SYSTEMIC 08/18/2006    Past Surgical History:  Procedure Laterality Date   CHONDROPLASTY Left 08/17/2019   Procedure: CHONDROPLASTY;  Surgeon: Marlena Sima, MD;  Location: [facility];  Service: Orthopedics;  Laterality: Left;   CYSTOSCOPY W/ STONE MANIPULATION  ~ 2006   INTRAUTERINE DEVICE INSERTION     "initially put in in 04/2002; changed prn" (02/14/2013)   KNEE ARTHROSCOPY WITH LATERAL MENISECTOMY  08/17/2019   Procedure: KNEE ARTHROSCOPY WITH LATERAL MENISECTOMY;  Surgeon: Marlena Sima, MD;  Location:  SUR[facility]ERY [facility];  Service: Orthopedics;;   KNEE ARTHROSCOPY WITH MEDIAL MENISECTOMY Left 08/17/2019   Procedure: KNEE ARTHROSCOPY WITH MEDIAL MENISECTOMY;  Surgeon: Marlena Sima, MD;  Location: [facility];  Service: Orthopedics;  Laterality: Left;   LAPAROSCOPIC CHOLECYSTECTOMY  ~ 2004   TOTAL KNEE ARTHROPLASTY Left 01/16/2021   Procedure: TOTAL KNEE ARTHROPLASTY;  Surgeon: Winston Hawking, MD;  Location: [facility];  Service: Orthopedics;  Laterality: Left;  with adductor canal    OB History   No obstetric history on file.      Home Medications     Prior to Admission medications   Medication Sig Start Date End Date Taking? Authorizing Provider  amitriptyline  (ELAVIL ) 50 MG tablet Take 1 tablet by mouth at bedtime as needed.   Yes [provider]  Butalbital -APAP-Caffeine  50-325-40 MG capsule Take 1 capsule by mouth every 6 (six) hours as needed for headache (migraine). 09/04/20  Yes [provider]  cetirizine (ZYRTEC) 10 MG tablet Take 10 mg by mouth daily as needed for allergies.   Yes [provider]  cloNIDine  (CATAPRES ) 0.1 MG tablet TAKE 1 TABLET(0.1 MG) BY MOUTH TWICE DAILY Patient taking differently: Take 0.1 mg by mouth 2 (two) times daily. 08/28/18  Yes Massie Soles, MD  Diclofenac  Sodium 3 % GEL Apply 1 application topically 2 (two) times daily as needed (pain). 09/06/20  Yes [provider]  EPINEPHrine  0.3 mg/0.3 mL IJ SOAJ injection Inject 0.3 mg into the muscle as needed for anaphylaxis. 09/18/20  Yes [provider]  fluticasone  (FLONASE ) 50 MCG/ACT nasal spray Place 1 spray into both nostrils daily as needed for allergies or rhinitis.   Yes [provider]  gabapentin  (NEURONTIN ) 300 MG capsule Take 300 mg by mouth 2 (two) times daily. 08/26/20  Yes [provider]  hydrochlorothiazide  (HYDRODIURIL ) 25 MG tablet TAKE 1 TABLET(25 MG) BY MOUTH DAILY Patient taking differently: Take 25 mg by mouth daily. 08/28/18  Yes Massie Soles, MD  lidocaine  (XYLOCAINE ) 5 % ointment Apply 1 Application topically daily as needed for moderate pain. 10/19/21  Yes [provider]  lisinopril -hydrochlorothiazide  (ZESTORETIC ) 20-12.5 MG tablet Take 1 tablet by mouth daily. 12/28/12  Yes [provider]  naloxone [facility]) nasal spray 4 mg/0.1 mL SMARTSIG:Spray(s) Both Nares 08/24/22  Yes [provider]  ondansetron  (ZOFRAN ) 4 MG tablet Take 4 mg by mouth 3 (three) times daily as needed for nausea or vomiting.   Yes [provider]  oxyCODONE  (ROXICODONE ) 15 MG  immediate release tablet Take 15 mg by mouth every 6 (six) hours. 09/04/20  Yes [provider]  oxyCODONE -acetaminophen  (PERCOCET) 10-325 MG tablet Take by mouth. 12/28/12  Yes [provider]  pantoprazole  (PROTONIX ) 40 MG tablet Take 40 mg by mouth in the morning.   Yes [provider]  Phendimetrazine  Tartrate 105 MG CP24 Take 105 mg by mouth daily as needed (weight loss). Last dose approx 12/22/20 per pt 09/21/20  Yes [provider]  phentermine (ADIPEX-P) 37.5 MG tablet Take by mouth. 07/13/22  Yes [provider]  polyethylene glycol (MIRALAX  / GLYCOLAX ) 17 g packet Take 17 g by mouth daily as needed for moderate constipation.   Yes [provider]  potassium chloride  (KLOR-CON ) 10 MEQ tablet Take 2 tablets (20 mEq total) by mouth daily. 02/20/20  Yes Valli Gaw, MD  pregabalin  (LYRICA ) 150 MG capsule Take 150 mg by mouth 2 (two) times daily.   Yes [provider]  promethazine  (PHENERGAN ) 25 MG tablet Take 1 tablet (25 mg total) by mouth every 6 (six) hours as needed for nausea or vomiting. 04/30/19  Yes Wieters, Hallie C, PA-C  senna-docusate (SENOKOT-S) 8.6-50  MG tablet Take 2 tablets by mouth 2 (two) times daily. 12/19/21  Yes Akula, Vijaya, MD  SUMAtriptan  (IMITREX ) 100 MG tablet Take 100 mg by mouth every 2 (two) hours as needed for migraine. May repeat in 2 hours if headache persists or recurs.   Yes [provider]  tiZANidine  (ZANAFLEX ) 4 MG capsule Take 1 capsule (4 mg total) by mouth at bedtime as needed for muscle spasms. 12/19/21  Yes Akula, Vijaya, MD  traZODone  (DESYREL ) 100 MG tablet Take 100 mg by mouth at bedtime as needed for sleep. 12/07/21  Yes [provider]  Vitamin D , Ergocalciferol , (DRISDOL ) 1.25 MG (50000 UNIT) CAPS capsule Take 50,000 Units by mouth every Monday. 09/14/20  Yes [provider]    Family History Family History  Problem Relation Age of Onset   Cancer Mother    Pancreatic  cancer Mother    Cirrhosis Father    Lupus Sister    Diabetes Maternal Grandmother     Social History Social History   Tobacco Use   Smoking status: Never   Smokeless tobacco: Never  Vaping Use   Vaping status: Never Used  Substance Use Topics   Alcohol use: Yes    Comment: social   Drug use: No     Allergies   Apple, Aspirin, Daucus carota, and Haloperidol  and related   Review of Systems Review of Systems Per HPI  Physical Exam Triage Vital Signs ED Triage Vitals [10/17/23 1423]  Encounter Vitals Group     BP      Systolic BP Percentile      Diastolic BP Percentile      Pulse      Resp      Temp      Temp src      SpO2      Weight      Height      Head Circumference      Peak Flow      Pain Score 10     Pain Loc      Pain Education      Exclude from Growth Chart    No data found.  Updated Vital Signs BP (!) 150/90 (BP Location: Left Arm)   Pulse 84   Temp 98.4 F (36.9 C) (Oral)   Resp 18   SpO2 98%    Physical Exam Vitals and nursing note reviewed.  Constitutional:      General: She is not in acute distress.    Comments: In clinic room with lights off  HENT:     Head: Atraumatic.     Mouth/Throat:     Mouth: Mucous membranes are moist.     Pharynx: Oropharynx is clear.  Eyes:     General: Gaze aligned appropriately.     Extraocular Movements: Extraocular movements intact.     Right eye: Normal extraocular motion and no nystagmus.     Left eye: Normal extraocular motion and no nystagmus.     Conjunctiva/sclera: Conjunctivae normal.     Pupils: Pupils are equal, round, and reactive to light.     Comments: Colored contacts in both eyes. Pupils visualized through these. Photosensitivity.   Cardiovascular:     Rate and Rhythm: Normal rate and regular rhythm.     Pulses: Normal pulses.     Heart sounds: Normal heart sounds.  Pulmonary:     Effort: Pulmonary effort is normal.     Breath sounds: Normal breath sounds.  Abdominal:  Palpations: Abdomen is soft.     Tenderness: There is no abdominal tenderness.  Musculoskeletal:        General: Normal range of motion.     Cervical back: Normal range of motion.  Skin:    General: Skin is warm and dry.     Findings: Erythema present.          Comments: Right lower arm has circular area of mild swelling and very faint erythema. Tender to touch. Central punctate lesion likely from insect bite. No induration or fluctuance noted. Grip strength 5/5, sensation intact distally. Strong radial pulse, cap refill, < 2 seconds  Neurological:     Mental Status: She is alert and oriented to person, place, and time.     Sensory: No sensory deficit.     Motor: No weakness.     Coordination: Coordination normal.     Comments: Strength equal bilaterally, sensation normal     UC Treatments / Results  Labs (all labs ordered are listed, but only abnormal results are displayed) Labs Reviewed - No data to display  EKG  Radiology No results found.  Procedures Procedures (including critical care time)  Medications Ordered in UC Medications  dexamethasone  (DECADRON ) injection 10 mg (10 mg Intramuscular Given 10/17/23 1508)  SUMAtriptan  (IMITREX ) injection 6 mg (6 mg Subcutaneous Given 10/17/23 1509)  ondansetron  (ZOFRAN ) injection 4 mg (4 mg Intramuscular Given 10/17/23 1506)    Initial Impression / Assessment and Plan / UC Course  I have reviewed the triage vital signs and the nursing notes.  Pertinent labs & imaging results that were available during my care of the patient were reviewed by me and considered in my medical decision making (see chart for details).  Overall stable vitals. Mildly hypertensive 150/90 which is her baseline.   IM sumatriptan  and decadron  offered, patient accepts.  Patient requesting something for nausea, IM zofran  given as well  Ice pack applied to area of insect bite. At this time no concern for abscess or bacterial infection. Suspect localized  reaction to bite. Advised continuing ice several times daily, elevate. Can try benadryl  for additional swelling relief. Advised the steroid shot will also help to reduce swelling.  Patient does not want to be seen in the ED. She states these are normal symptoms for her severe migraines. She really only wanted to be seen for the insect bite. Close follow up with neurology recommended. Next appointment in 2 months, recommend to call for sooner if able. Advised strict ED precautions in the meantime. Patient verbalizes understanding. Reports she is comfortable going home now. Friend transported her here and will drive her home  Final Clinical Impressions(s) / UC Diagnoses   Final diagnoses:  Other migraine with status migrainosus, intractable  Localized swelling of right upper extremity  Insect bite of right forearm, initial encounter     Discharge Instructions      Continue taking your migraine medications as directed With any worsening pain, or if you experience the worst headache of your life, please be evaluated in the emergency department immediately.  For the insect bite, you have some localized swelling that can be treated symptomatically.  Elevate the arm and apply ice several times daily.  The steroid injection given for your migraine should also help to bring down swelling.  You can take Benadryl  at night for further reduction of swelling.  Please note that will make you drowsy so be cautious.     ED Prescriptions   None  PDMP not reviewed this encounter.   Shelley Pooley, Ivette Marks, [place] 10/17/23 1558

## 2023-10-17 NOTE — ED Triage Notes (Addendum)
 Pt states she is here for insect bite she noticed it yesterday her right lower arm is red and swollen.   She also is having a migraine currently which she took her meds for at home around 9:30am but she vomited after so she isn't sure if it stayed down. She took phenergan  early this morning. Pt is in a wheelchair due to sx.

## 2023-10-17 NOTE — Discharge Instructions (Addendum)
 Continue taking your migraine medications as directed With any worsening pain, or if you experience the worst headache of your life, please be evaluated in the emergency department immediately.  For the insect bite, you have some localized swelling that can be treated symptomatically.  Elevate the arm and apply ice several times daily.  The steroid injection given for your migraine should also help to bring down swelling.  You can take Benadryl  at night for further reduction of swelling.  Please note that will make you drowsy so be cautious.

## 2023-12-16 ENCOUNTER — Encounter: Payer: Self-pay | Admitting: Obstetrics and Gynecology

## 2023-12-16 ENCOUNTER — Ambulatory Visit: Admitting: Obstetrics and Gynecology

## 2023-12-16 VITALS — BP 174/101 | HR 90 | Ht 65.0 in | Wt 193.0 lb

## 2023-12-16 DIAGNOSIS — Z01419 Encounter for gynecological examination (general) (routine) without abnormal findings: Secondary | ICD-10-CM | POA: Diagnosis not present

## 2023-12-16 NOTE — Progress Notes (Signed)
 ANNUAL EXAM Patient name: Kirsten Walsh MRN [id number]  Date of birth: Jul 21, 1960 Chief Complaint:   New Patient (Initial Visit) and Annual Exam  History of Present Illness:   Kirsten Walsh is a 63 y.o. G4P0 with No LMP recorded. (Menstrual status: IUD). being seen today for a routine annual exam.  Current complaints:   IUD in place since 2016. Has family members that got pregnant in their 67s and she wants to be certain that she can't get pregnant. She has not gotten a period in 20+ year but has had an IUD in the for that time. Denies symptoms of menopause.   Reports a scare last year where she had + home UPT, but then UPT with PCP was negative.  Has felt lump in left axilla occasionally  Last pap 03/04/20. Results were: NILM w/ HRHPV negative. H/O abnormal pap: no Last mammogram: 2023 normal per pt.  Last colonoscopy: 2024 normal per pt     12/16/2023   10:46 AM 03/25/2020    3:07 PM 03/04/2020    2:53 PM 03/04/2020    2:14 PM 07/31/2018    2:53 PM  Depression screen PHQ 2/9  Decreased Interest 0 0 0 -- 0  Down, Depressed, Hopeless 0 0 0  0  PHQ - 2 Score 0 0 0  0  Altered sleeping 1 0 0    Tired, decreased energy 0 0 0    Change in appetite 0 0 0    Feeling bad or failure about yourself  0 0 0    Trouble concentrating 0 0 0    Moving slowly or fidgety/restless 0 0 0    Suicidal thoughts 0 0 0    PHQ-9 Score 1 0 0    Difficult doing work/chores  Not difficult at all Not difficult at all          12/16/2023   10:46 AM  GAD 7 : Generalized Anxiety Score  Nervous, Anxious, on Edge 0  Control/stop worrying 0  Worry too much - different things 0  Trouble relaxing 0  Restless 0  Easily annoyed or irritable 0  Afraid - awful might happen 0  Total GAD 7 Score 0     Review of Systems:   Pertinent items are noted in HPI Denies any headaches, blurred vision, fatigue, shortness of breath, chest pain, abdominal pain, abnormal vaginal  discharge/itching/odor/irritation, problems with periods, bowel movements, urination, or intercourse unless otherwise stated above. Pertinent History Reviewed:  Reviewed past medical,surgical, social and family history.  Reviewed problem list, medications and allergies. Physical Assessment:   Vitals:   12/16/23 1013 12/16/23 1030  BP: (!) 162/100 (!) 174/101  Pulse: 84 90  Weight: 193 lb (87.5 kg)   Height: 5' 5 (1.651 m)   Body mass index is 32.12 kg/m.        Physical Examination:   General appearance - well appearing, and in no distress  Mental status - alert, oriented to person, place, and time  Chest - respiratory effort normal  Heart - normal peripheral perfusion  Breasts - breasts appear normal, no suspicious masses, no skin or nipple changes or axillary nodes  Abdomen - soft, nontender, nondistended, no masses or organomegaly  Pelvic - VULVA: normal appearing vulva with no masses, tenderness or lesions  CERVIX: no mass or tenderness  UTERUS: uterus is felt to be normal size, shape, consistency and nontender   ADNEXA: No adnexal masses or tenderness noted.  Chaperone present for exam  No results found for this or any previous visit (from the past 24 hours).  Assessment & Plan:  1) Well-Woman Exam Mammogram: schedule screening mammo as soon as possible Colonoscopy: per GI Pap: Due 2026 GC/CT/HIV/HCV: declines Discussed IUD at end of it's lifetime (present since 2016) and since she hasn't started getting periods she is likely menopausal and no longer able to conceive. She wants to be absolutely certain and requests IUD removal and replacement. Will return for placement. Needs to be in procedure room due to back pain and mild mobility limitations  Labs/procedures today:  Orders Placed This Encounter  Procedures   MM 3D SCREENING MAMMOGRAM BILATERAL BREAST   Meds: No orders of the defined types were placed in this encounter.  Follow-up: Return for IUD replacement -  procedure room (mobility).  Kieth JAYSON Carolin, MD 12/16/2023 1:35 PM

## 2023-12-16 NOTE — Progress Notes (Signed)
 Pt is new to office here for routine exam, pt has IUD and would like to discuss.

## 2024-01-03 ENCOUNTER — Ambulatory Visit

## 2024-01-03 ENCOUNTER — Ambulatory Visit: Admitting: Obstetrics and Gynecology

## 2024-01-11 ENCOUNTER — Other Ambulatory Visit (HOSPITAL_COMMUNITY): Payer: Self-pay | Admitting: Orthopedic Surgery

## 2024-01-11 DIAGNOSIS — M25561 Pain in right knee: Secondary | ICD-10-CM

## 2024-01-11 DIAGNOSIS — M25562 Pain in left knee: Secondary | ICD-10-CM

## 2024-01-17 ENCOUNTER — Ambulatory Visit

## 2024-01-23 ENCOUNTER — Other Ambulatory Visit: Payer: Self-pay

## 2024-01-23 ENCOUNTER — Emergency Department (HOSPITAL_COMMUNITY)

## 2024-01-23 ENCOUNTER — Emergency Department (HOSPITAL_COMMUNITY)
Admission: EM | Admit: 2024-01-23 | Discharge: 2024-01-23 | Disposition: A | Discharge status: Home / Self Care | Attending: Emergency Medicine | Admitting: Emergency Medicine

## 2024-01-23 DIAGNOSIS — R519 Headache, unspecified: Secondary | ICD-10-CM | POA: Insufficient documentation

## 2024-01-23 DIAGNOSIS — W19XXXA Unspecified fall, initial encounter: Secondary | ICD-10-CM | POA: Diagnosis not present

## 2024-01-23 DIAGNOSIS — M545 Low back pain, unspecified: Secondary | ICD-10-CM | POA: Diagnosis not present

## 2024-01-23 DIAGNOSIS — I10 Essential (primary) hypertension: Secondary | ICD-10-CM | POA: Diagnosis not present

## 2024-01-23 DIAGNOSIS — Z79899 Other long term (current) drug therapy: Secondary | ICD-10-CM | POA: Diagnosis not present

## 2024-01-23 LAB — CBC
HCT: 39.4 % (ref 36.0–46.0)
Hemoglobin: 12.9 g/dL (ref 12.0–15.0)
MCH: 29.9 pg (ref 26.0–34.0)
MCHC: 32.7 g/dL (ref 30.0–36.0)
MCV: 91.4 fL (ref 80.0–100.0)
Platelets: 248 K/uL (ref 150–400)
RBC: 4.31 MIL/uL (ref 3.87–5.11)
RDW: 14.2 % (ref 11.5–15.5)
WBC: 4.2 K/uL (ref 4.0–10.5)
nRBC: 0 % (ref 0.0–0.2)

## 2024-01-23 LAB — COMPREHENSIVE METABOLIC PANEL WITH GFR
ALT: 16 U/L (ref 0–44)
AST: 23 U/L (ref 15–41)
Albumin: 3.7 g/dL (ref 3.5–5.0)
Alkaline Phosphatase: 95 U/L (ref 38–126)
Anion gap: 9 (ref 5–15)
BUN: 12 mg/dL (ref 6–20)
CO2: 28 mmol/L (ref 22–32)
Calcium: 8.8 mg/dL — ABNORMAL LOW (ref 8.9–10.3)
Chloride: 103 mmol/L (ref 98–111)
Creatinine, Ser: 1.07 mg/dL — ABNORMAL HIGH (ref 0.44–1.00)
GFR, Estimated: 59 mL/min — ABNORMAL LOW (ref >60)
Glucose, Bld: 95 mg/dL (ref 70–99)
Potassium: 3.7 mmol/L (ref 3.5–5.1)
Sodium: 140 mmol/L (ref 135–145)
Total Bilirubin: 0.7 mg/dL (ref 0.0–1.2)
Total Protein: 6.7 g/dL (ref 6.5–8.1)

## 2024-01-23 LAB — MAGNESIUM: Magnesium: 2.1 mg/dL (ref 1.7–2.4)

## 2024-01-23 LAB — ETHANOL: Alcohol, Ethyl (B): <15 mg/dL (ref <15)

## 2024-01-23 MED ORDER — KETOROLAC TROMETHAMINE 15 MG/ML IJ SOLN
15.0000 mg | Freq: Once | INTRAMUSCULAR | Status: AC
  Administered 2024-01-23: 15 mg via INTRAVENOUS
  Filled 2024-01-23: qty 1

## 2024-01-23 MED ORDER — METHOCARBAMOL 500 MG PO TABS
1000.0000 mg | ORAL_TABLET | Freq: Once | ORAL | Status: AC
  Administered 2024-01-23: 1000 mg via ORAL
  Filled 2024-01-23: qty 2

## 2024-01-23 MED ORDER — LIDOCAINE 5 % EX PTCH
1.0000 | MEDICATED_PATCH | CUTANEOUS | Status: DC
  Administered 2024-01-23: 1 via TRANSDERMAL
  Filled 2024-01-23: qty 1

## 2024-01-23 MED ORDER — PROCHLORPERAZINE EDISYLATE 10 MG/2ML IJ SOLN
10.0000 mg | Freq: Once | INTRAMUSCULAR | Status: AC
  Administered 2024-01-23: 10 mg via INTRAVENOUS
  Filled 2024-01-23: qty 2

## 2024-01-23 MED ORDER — OXYCODONE HCL 5 MG PO TABS
15.0000 mg | ORAL_TABLET | Freq: Once | ORAL | Status: AC
  Administered 2024-01-23: 15 mg via ORAL
  Filled 2024-01-23: qty 3

## 2024-01-23 MED ORDER — LACTATED RINGERS IV BOLUS
1000.0000 mL | Freq: Once | INTRAVENOUS | Status: AC
  Administered 2024-01-23: 1000 mL via INTRAVENOUS

## 2024-01-23 MED ORDER — DIPHENHYDRAMINE HCL 50 MG/ML IJ SOLN
12.5000 mg | Freq: Once | INTRAMUSCULAR | Status: AC
  Administered 2024-01-23: 12.5 mg via INTRAVENOUS
  Filled 2024-01-23: qty 1

## 2024-01-23 NOTE — ED Triage Notes (Addendum)
 According to [facility]: Pt found down by a friend,fall unwitnessed, 911 was called for ams. Upon arrival, pt found sitting in chair upright, responded to verbal, gcs of 13.Once ems began to talk to her she began to orient to aox4. Complains of pain in back, femur sand shoulder.  Right wrist fractured from previous syncopal episode. Ems unable to obtain IV access.  Vitals: Bp 160/110 Hr 90 sinus Rr 22 Spo2 97% on room air.

## 2024-01-23 NOTE — ED Notes (Signed)
 Patient transported to CT

## 2024-01-23 NOTE — ED Notes (Signed)
 Patient returned from CT and transported to X-ray

## 2024-01-23 NOTE — Discharge Instructions (Addendum)
 Your test results today were reassuring.  Continue home medications as prescribed.  Follow-up with your neurologist for ongoing management of your headaches.  Return to the emergency department for any new or worsening symptoms of concern.
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
## 2024-01-31 ENCOUNTER — Encounter (HOSPITAL_COMMUNITY)
Admission: RE | Admit: 2024-01-31 | Discharge: 2024-01-31 | Disposition: A | Discharge status: Home / Self Care | Source: Ambulatory Visit | Attending: Orthopedic Surgery | Admitting: Orthopedic Surgery

## 2024-01-31 DIAGNOSIS — M25561 Pain in right knee: Secondary | ICD-10-CM | POA: Diagnosis present

## 2024-01-31 DIAGNOSIS — M25562 Pain in left knee: Secondary | ICD-10-CM | POA: Diagnosis present

## 2024-01-31 MED ORDER — TECHNETIUM TC 99M MEDRONATE IV KIT
17.3000 | PACK | Freq: Once | INTRAVENOUS | Status: AC
  Administered 2024-01-31 (×2): 17.3 via INTRAVENOUS

## 2024-02-01 ENCOUNTER — Ambulatory Visit
Admission: RE | Admit: 2024-02-01 | Discharge: 2024-02-01 | Disposition: A | Discharge status: Home / Self Care | Source: Ambulatory Visit | Attending: Obstetrics and Gynecology | Admitting: Obstetrics and Gynecology

## 2024-02-01 DIAGNOSIS — Z01419 Encounter for gynecological examination (general) (routine) without abnormal findings: Secondary | ICD-10-CM

## 2024-02-06 ENCOUNTER — Ambulatory Visit: Payer: Self-pay | Admitting: Obstetrics and Gynecology

## 2024-03-13 ENCOUNTER — Emergency Department (HOSPITAL_COMMUNITY)

## 2024-03-13 ENCOUNTER — Encounter (HOSPITAL_COMMUNITY): Payer: Self-pay

## 2024-03-13 ENCOUNTER — Emergency Department (HOSPITAL_COMMUNITY)
Admission: EM | Admit: 2024-03-13 | Discharge: 2024-03-13 | Disposition: A | Discharge status: Home / Self Care | Attending: Emergency Medicine | Admitting: Emergency Medicine

## 2024-03-13 ENCOUNTER — Other Ambulatory Visit: Payer: Self-pay

## 2024-03-13 DIAGNOSIS — I10 Essential (primary) hypertension: Secondary | ICD-10-CM | POA: Insufficient documentation

## 2024-03-13 DIAGNOSIS — G43809 Other migraine, not intractable, without status migrainosus: Secondary | ICD-10-CM | POA: Insufficient documentation

## 2024-03-13 DIAGNOSIS — R55 Syncope and collapse: Secondary | ICD-10-CM | POA: Diagnosis present

## 2024-03-13 LAB — COMPREHENSIVE METABOLIC PANEL WITH GFR
ALT: 16 U/L (ref 0–44)
AST: 18 U/L (ref 15–41)
Albumin: 3.9 g/dL (ref 3.5–5.0)
Alkaline Phosphatase: 83 U/L (ref 38–126)
Anion gap: 11 (ref 5–15)
BUN: 8 mg/dL (ref 6–20)
CO2: 25 mmol/L (ref 22–32)
Calcium: 9.2 mg/dL (ref 8.9–10.3)
Chloride: 104 mmol/L (ref 98–111)
Creatinine, Ser: 1.19 mg/dL — ABNORMAL HIGH (ref 0.44–1.00)
GFR, Estimated: 52 mL/min — ABNORMAL LOW (ref >60)
Glucose, Bld: 92 mg/dL (ref 70–99)
Potassium: 3.5 mmol/L (ref 3.5–5.1)
Sodium: 140 mmol/L (ref 135–145)
Total Bilirubin: 0.3 mg/dL (ref 0.0–1.2)
Total Protein: 6.5 g/dL (ref 6.5–8.1)

## 2024-03-13 LAB — CBC WITH DIFFERENTIAL/PLATELET
Abs Immature Granulocytes: 0.01 K/uL (ref 0.00–0.07)
Basophils Absolute: 0 K/uL (ref 0.0–0.1)
Basophils Relative: 1 %
Eosinophils Absolute: 0 K/uL (ref 0.0–0.5)
Eosinophils Relative: 1 %
HCT: 37.1 % (ref 36.0–46.0)
Hemoglobin: 11.8 g/dL — ABNORMAL LOW (ref 12.0–15.0)
Immature Granulocytes: 0 %
Lymphocytes Relative: 33 %
Lymphs Abs: 1.8 K/uL (ref 0.7–4.0)
MCH: 30.2 pg (ref 26.0–34.0)
MCHC: 31.8 g/dL (ref 30.0–36.0)
MCV: 94.9 fL (ref 80.0–100.0)
Monocytes Absolute: 0.4 K/uL (ref 0.1–1.0)
Monocytes Relative: 8 %
Neutro Abs: 3.1 K/uL (ref 1.7–7.7)
Neutrophils Relative %: 57 %
Platelets: 234 K/uL (ref 150–400)
RBC: 3.91 MIL/uL (ref 3.87–5.11)
RDW: 13.7 % (ref 11.5–15.5)
WBC: 5.4 K/uL (ref 4.0–10.5)
nRBC: 0 % (ref 0.0–0.2)

## 2024-03-13 LAB — CBG MONITORING, ED: Glucose-Capillary: 71 mg/dL (ref 70–99)

## 2024-03-13 LAB — TROPONIN T, HIGH SENSITIVITY: Troponin T High Sensitivity: <15 ng/L (ref 0–19)

## 2024-03-13 MED ORDER — SODIUM CHLORIDE 0.9 % IV BOLUS
1000.0000 mL | Freq: Once | INTRAVENOUS | Status: AC
  Administered 2024-03-13: 1000 mL via INTRAVENOUS

## 2024-03-13 MED ORDER — DEXAMETHASONE SODIUM PHOSPHATE 10 MG/ML IJ SOLN
10.0000 mg | Freq: Once | INTRAMUSCULAR | Status: AC
  Administered 2024-03-13: 10 mg via INTRAVENOUS
  Filled 2024-03-13: qty 1

## 2024-03-13 MED ORDER — MORPHINE SULFATE (PF) 4 MG/ML IV SOLN
4.0000 mg | Freq: Once | INTRAVENOUS | Status: AC
  Administered 2024-03-13: 4 mg via INTRAVENOUS
  Filled 2024-03-13: qty 1

## 2024-03-13 MED ORDER — PROCHLORPERAZINE EDISYLATE 10 MG/2ML IJ SOLN
10.0000 mg | Freq: Once | INTRAMUSCULAR | Status: AC
  Administered 2024-03-13: 10 mg via INTRAVENOUS
  Filled 2024-03-13: qty 2

## 2024-03-13 MED ORDER — METOCLOPRAMIDE HCL 5 MG/ML IJ SOLN
10.0000 mg | Freq: Once | INTRAMUSCULAR | Status: AC
Start: 2024-03-13 — End: 2024-03-13
  Administered 2024-03-13: 10 mg via INTRAVENOUS
  Filled 2024-03-13: qty 2

## 2024-03-13 NOTE — Discharge Instructions (Addendum)
 Continue your your home medications.  Try to drink plenty of fluids.  Follow-up with your doctor to be rechecked.  Return to the ED for recurrent symptoms

## 2024-03-13 NOTE — ED Triage Notes (Signed)
 Pt BIB EMS from Doctor's office due to syncopal episode unknown down time, unclear if pt hit her head, no visible injuries. EMS called immediately. Pt c/o migraine n/v, and left knee pain for initial fall, left shoulder pain. Pt was history of syncopal episodes in the past due to migraine. Hx of LOC, Migraine, Left shoulder pain.
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
## 2024-05-01 ENCOUNTER — Emergency Department (HOSPITAL_COMMUNITY)
Admission: EM | Admit: 2024-05-01 | Discharge: 2024-05-01 | Disposition: A | Discharge status: Home / Self Care | Attending: Emergency Medicine | Admitting: Emergency Medicine

## 2024-05-01 ENCOUNTER — Encounter (HOSPITAL_COMMUNITY): Payer: Self-pay

## 2024-05-01 ENCOUNTER — Other Ambulatory Visit: Payer: Self-pay

## 2024-05-01 DIAGNOSIS — Z96652 Presence of left artificial knee joint: Secondary | ICD-10-CM | POA: Insufficient documentation

## 2024-05-01 DIAGNOSIS — Z79899 Other long term (current) drug therapy: Secondary | ICD-10-CM | POA: Diagnosis not present

## 2024-05-01 DIAGNOSIS — G43711 Chronic migraine without aura, intractable, with status migrainosus: Secondary | ICD-10-CM | POA: Diagnosis not present

## 2024-05-01 DIAGNOSIS — I10 Essential (primary) hypertension: Secondary | ICD-10-CM | POA: Diagnosis not present

## 2024-05-01 DIAGNOSIS — M545 Low back pain, unspecified: Secondary | ICD-10-CM | POA: Diagnosis not present

## 2024-05-01 DIAGNOSIS — R55 Syncope and collapse: Secondary | ICD-10-CM | POA: Diagnosis present

## 2024-05-01 DIAGNOSIS — E860 Dehydration: Secondary | ICD-10-CM | POA: Diagnosis not present

## 2024-05-01 LAB — BASIC METABOLIC PANEL WITH GFR
Anion gap: 10 (ref 5–15)
BUN: 11 mg/dL (ref 6–20)
CO2: 29 mmol/L (ref 22–32)
Calcium: 9.3 mg/dL (ref 8.9–10.3)
Chloride: 99 mmol/L (ref 98–111)
Creatinine, Ser: 0.97 mg/dL (ref 0.44–1.00)
GFR, Estimated: >60 mL/min (ref >60)
Glucose, Bld: 94 mg/dL (ref 70–99)
Potassium: 3.5 mmol/L (ref 3.5–5.1)
Sodium: 137 mmol/L (ref 135–145)

## 2024-05-01 LAB — CBC WITH DIFFERENTIAL/PLATELET
Abs Immature Granulocytes: 0 K/uL (ref 0.00–0.07)
Basophils Absolute: 0 K/uL (ref 0.0–0.1)
Basophils Relative: 1 %
Eosinophils Absolute: 0.1 K/uL (ref 0.0–0.5)
Eosinophils Relative: 3 %
HCT: 39.5 % (ref 36.0–46.0)
Hemoglobin: 12.9 g/dL (ref 12.0–15.0)
Immature Granulocytes: 0 %
Lymphocytes Relative: 49 %
Lymphs Abs: 1.7 K/uL (ref 0.7–4.0)
MCH: 30.6 pg (ref 26.0–34.0)
MCHC: 32.7 g/dL (ref 30.0–36.0)
MCV: 93.6 fL (ref 80.0–100.0)
Monocytes Absolute: 0.3 K/uL (ref 0.1–1.0)
Monocytes Relative: 9 %
Neutro Abs: 1.3 K/uL — ABNORMAL LOW (ref 1.7–7.7)
Neutrophils Relative %: 38 %
Platelets: 233 K/uL (ref 150–400)
RBC: 4.22 MIL/uL (ref 3.87–5.11)
RDW: 13.1 % (ref 11.5–15.5)
WBC: 3.4 K/uL — ABNORMAL LOW (ref 4.0–10.5)
nRBC: 0 % (ref 0.0–0.2)

## 2024-05-01 MED ORDER — PROCHLORPERAZINE EDISYLATE 10 MG/2ML IJ SOLN
10.0000 mg | Freq: Once | INTRAMUSCULAR | Status: AC
  Administered 2024-05-01: 10 mg via INTRAVENOUS
  Filled 2024-05-01: qty 2

## 2024-05-01 MED ORDER — PROCHLORPERAZINE MALEATE 10 MG PO TABS: 10.0000 mg | ORAL_TABLET | Freq: Two times a day (BID) | ORAL | 0 refills | Status: AC | PRN

## 2024-05-01 MED ORDER — ACETAMINOPHEN 500 MG PO TABS
1000.0000 mg | ORAL_TABLET | Freq: Once | ORAL | Status: AC
Start: 2024-05-01 — End: 2024-05-01
  Administered 2024-05-01: 1000 mg via ORAL
  Filled 2024-05-01: qty 2

## 2024-05-01 MED ORDER — METOCLOPRAMIDE HCL 10 MG PO TABS
10.0000 mg | ORAL_TABLET | Freq: Once | ORAL | Status: AC
  Administered 2024-05-01: 10 mg via ORAL
  Filled 2024-05-01: qty 1

## 2024-05-01 MED ORDER — SODIUM CHLORIDE 0.9 % IV BOLUS
1000.0000 mL | Freq: Once | INTRAVENOUS | Status: AC
  Administered 2024-05-01: 1000 mL via INTRAVENOUS

## 2024-05-01 MED ORDER — DEXAMETHASONE SOD PHOSPHATE PF 10 MG/ML IJ SOLN
10.0000 mg | Freq: Once | INTRAMUSCULAR | Status: AC
  Administered 2024-05-01: 10 mg via INTRAVENOUS

## 2024-05-01 MED ORDER — MORPHINE SULFATE (PF) 4 MG/ML IV SOLN
4.0000 mg | Freq: Once | INTRAVENOUS | Status: AC
  Administered 2024-05-01: 4 mg via INTRAVENOUS
  Filled 2024-05-01: qty 1

## 2024-05-01 MED ORDER — DIPHENHYDRAMINE HCL 50 MG/ML IJ SOLN
25.0000 mg | Freq: Once | INTRAMUSCULAR | Status: AC
  Administered 2024-05-01: 25 mg via INTRAVENOUS
  Filled 2024-05-01: qty 1

## 2024-05-01 NOTE — ED Triage Notes (Signed)
 Pt BIBA from home, c/o syncope today, Denies head strike or neck pain. GCS 15. A&Ox4. VSS.  CBG 124

## 2024-05-01 NOTE — Discharge Instructions (Signed)
 It was a pleasure caring for you today in the emergency department.  Please continue to follow-up with your neurologist for continued care of your migraines.  You are being sent home with some Compazine  that you can take twice per day as needed for nausea and migraine symptoms.  Please try to stay well-hydrated and drink as much fluids as possible.  Please return to the emergency department for any worsening or worrisome symptoms.

## 2024-05-01 NOTE — ED Provider Notes (Signed)
 [place] [facility] AT [facility] Provider Note  CSN: [id number] Arrival date & time: 05/01/24 1437  Chief Complaint(s) Loss of Consciousness  HPI Kirsten Walsh is a 63 y.o. female with past medical history as below, significant for migraines without aura, chronic low back pain, HTN, insomnia, and recurrent syncope who presents to the ED with complaint of syncope.  Patient reports she was talking with a friend at home earlier today when she experienced a syncopal event where her friend caught her.  Denies hitting her head.  Reports she lost consciousness for an unknown amount of time, feels like it was a short period likely only a couple of minutes.  Reports migraine with persistent nausea x 7 days with decreased p.o. intake.  Reports additional 3 episodes of nonbloody vomiting this morning.  Usually show chronic migraines followed by neurology currently on Ubrelvy for suppressive therapy.   Currently still feeling nauseous and dizzy.  History of frequent syncopal events with her recurrent migraines.  Endorses some significant back pain, reports this is chronic and taking oxycodone  20 mg every 6 hours at home.  Denies fever.  Lives at home with her son.  Past Medical History Past Medical History:  Diagnosis Date   Arthritis    Chest pain    Chronic lower back pain    right side to mid back (07/06/2017)   Family history of adverse reaction to anesthesia    daughter did; not sure happened   Gall stones    History of anaphylaxis 06/26/2014   History of kidney stones    Hypertension    Migraine    qd (07/06/2017)   Syncope and collapse 07/06/2017    sitting on the commode; developed nausea and passed out; hit head on shower and suffered a laceration; woke up in a pool of blood   Patient Active Problem List   Diagnosis Date Noted   Seizures (HCC) 12/17/2021   Seizure-like activity (HCC) 12/15/2021   S/P TKR (total knee replacement), left 01/18/2021    H/O total knee replacement, left 01/16/2021   Cervical cancer screening 03/09/2020   Encounter for administration of vaccine 03/09/2020   Preoperative clearance 07/24/2019   Right arm pain 06/12/2019   Syncope 07/06/2017   Chronic daily headache 09/02/2015   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   Syncopal episodes 09/02/2015   Right arm weakness 08/05/2015   Edema of right arm and right leg 09/07/2014   Intractable migraine with status migrainosus 06/10/2014   Chronic migraine without aura, intractable, with status migrainosus    Bradycardia 02/18/2013   Left knee pain 01/16/2013   Cardiomyopathy (HCC) 10/20/2011   Leg swelling 09/23/2011   Complex regional pain syndrome of right lower extremity 05/03/2011   Chronic pain 03/29/2011   NECK PAIN, RIGHT 02/03/2010   ALLERGIC RHINITIS 01/30/2009   DIZZINESS 07/16/2008   Nonintractable chronic migraine 04/26/2007   OBESITY, NOS 08/18/2006   HYPERTENSION, BENIGN SYSTEMIC 08/18/2006   Home Medication(s) Prior to Admission medications   Medication Sig Start Date End Date Taking? Authorizing Provider  prochlorperazine  (COMPAZINE ) 10 MG tablet Take 1 tablet (10 mg total) by mouth 2 (two) times daily as needed for nausea or vomiting. 05/01/24  Yes Theophilus Pagan, MD  amitriptyline  (ELAVIL ) 50 MG tablet Take 1 tablet by mouth at bedtime as needed.    [provider]  Butalbital -APAP-Caffeine  50-325-40 MG capsule Take 1 capsule by mouth every 6 (six) hours as needed for headache (migraine). 09/04/20   [provider]  cetirizine (ZYRTEC) 10 MG tablet Take 10 mg by mouth daily as needed for allergies.    [provider]  cloNIDine  (CATAPRES ) 0.1 MG tablet TAKE 1 TABLET(0.1 MG) BY MOUTH TWICE DAILY 08/28/18   Lanny Maxwell, MD  Diclofenac  Sodium 3 % GEL Apply 1 application topically 2 (two) times daily as needed (pain). 09/06/20   [provider]  EPINEPHrine  0.3 mg/0.3 mL IJ SOAJ injection Inject 0.3 mg into the muscle  as needed for anaphylaxis. 09/18/20   [provider]  fluticasone  (FLONASE ) 50 MCG/ACT nasal spray Place 1 spray into both nostrils daily as needed for allergies or rhinitis.    [provider]  gabapentin  (NEURONTIN ) 300 MG capsule Take 300 mg by mouth 2 (two) times daily. 08/26/20   [provider]  hydrochlorothiazide  (HYDRODIURIL ) 25 MG tablet TAKE 1 TABLET(25 MG) BY MOUTH DAILY 08/28/18   Lanny Maxwell, MD  lidocaine  (XYLOCAINE ) 5 % ointment Apply 1 Application topically daily as needed for moderate pain. 10/19/21   [provider]  lisinopril -hydrochlorothiazide  (ZESTORETIC ) 20-12.5 MG tablet Take 1 tablet by mouth daily. Patient not taking: Reported on 12/16/2023 12/28/12   [provider]  naloxone [facility]) nasal spray 4 mg/0.1 mL SMARTSIG:Spray(s) Both Nares Patient not taking: Reported on 12/16/2023 08/24/22   [provider]  ondansetron  (ZOFRAN ) 4 MG tablet Take 4 mg by mouth 3 (three) times daily as needed for nausea or vomiting.    [provider]  oxyCODONE  (ROXICODONE ) 15 MG immediate release tablet Take 15 mg by mouth every 6 (six) hours. 09/04/20   [provider]  oxyCODONE -acetaminophen  (PERCOCET) 10-325 MG tablet Take by mouth. Patient not taking: Reported on 12/16/2023 12/28/12   [provider]  pantoprazole  (PROTONIX ) 40 MG tablet Take 40 mg by mouth in the morning.    [provider]  Phendimetrazine  Tartrate 105 MG CP24 Take 105 mg by mouth daily as needed (weight loss). Last dose approx 12/22/20 per pt 09/21/20   [provider]  phentermine (ADIPEX-P) 37.5 MG tablet Take by mouth. 07/13/22   [provider]  polyethylene glycol (MIRALAX  / GLYCOLAX ) 17 g packet Take 17 g by mouth daily as needed for moderate constipation.    [provider]  potassium chloride  (KLOR-CON ) 10 MEQ tablet Take 2 tablets (20 mEq total) by mouth daily. 02/20/20   Hope Merle, MD  promethazine   (PHENERGAN ) 25 MG tablet Take 1 tablet (25 mg total) by mouth every 6 (six) hours as needed for nausea or vomiting. 04/30/19   Wieters, Hallie C, PA-C  senna-docusate (SENOKOT-S) 8.6-50 MG tablet Take 2 tablets by mouth 2 (two) times daily. 12/19/21   Akula, Vijaya, MD  SUMAtriptan  (IMITREX ) 100 MG tablet Take 100 mg by mouth every 2 (two) hours as needed for migraine. May repeat in 2 hours if headache persists or recurs.    [provider]  tiZANidine  (ZANAFLEX ) 4 MG capsule Take 1 capsule (4 mg total) by mouth at bedtime as needed for muscle spasms. 12/19/21   Akula, Vijaya, MD  traZODone  (DESYREL ) 100 MG tablet Take 100 mg by mouth at bedtime as needed for sleep. Patient not taking: Reported on 12/16/2023 12/07/21   [provider]  Vitamin D , Ergocalciferol , (DRISDOL ) 1.25 MG (50000 UNIT) CAPS capsule Take 50,000 Units by mouth every Monday. 09/14/20   [provider]  Past Surgical History Past Surgical History:  Procedure Laterality Date   CHONDROPLASTY Left 08/17/2019   Procedure: CHONDROPLASTY;  Surgeon: Shari Sieving, MD;  Location: [facility];  Service: Orthopedics;  Laterality: Left;   CYSTOSCOPY W/ STONE MANIPULATION  ~ 2006   INTRAUTERINE DEVICE INSERTION     initially put in in 04/2002; changed prn (02/14/2013)   KNEE ARTHROSCOPY WITH LATERAL MENISECTOMY  08/17/2019   Procedure: KNEE ARTHROSCOPY WITH LATERAL MENISECTOMY;  Surgeon: Shari Sieving, MD;  Location: [facility];  Service: Orthopedics;;   KNEE ARTHROSCOPY WITH MEDIAL MENISECTOMY Left 08/17/2019   Procedure: KNEE ARTHROSCOPY WITH MEDIAL MENISECTOMY;  Surgeon: Shari Sieving, MD;  Location: [facility];  Service: Orthopedics;  Laterality: Left;   LAPAROSCOPIC CHOLECYSTECTOMY  ~ 2004   TOTAL KNEE ARTHROPLASTY Left 01/16/2021    Procedure: TOTAL KNEE ARTHROPLASTY;  Surgeon: Kay Kemps, MD;  Location: [facility];  Service: Orthopedics;  Laterality: Left;  with adductor canal   Family History Family History  Problem Relation Age of Onset   Cancer Mother    Pancreatic cancer Mother    Cirrhosis Father    Lupus Sister    Diabetes Maternal Grandmother     Social History Social History   Tobacco Use   Smoking status: Never   Smokeless tobacco: Never  Vaping Use   Vaping status: Never Used  Substance Use Topics   Alcohol use: Yes    Comment: social   Drug use: No   Allergies Apple, Aspirin, Daucus carota, and Haloperidol  and related  Review of Systems A thorough review of systems was obtained and all systems are negative except as noted in the HPI and PMH.   Physical Exam Vital Signs  I have reviewed the triage vital signs BP 133/76   Pulse 64   Temp 97.6 F (36.4 C) (Oral)   Resp 15   Ht 5' 5 (1.651 m)   Wt 82.6 kg   SpO2 100%   BMI 30.30 kg/m  Physical Exam General: Sitting up in bed in a dark room, NAD HEENT: Normocephalic. White sclera. No rhinorrhea or congestion CV: RRR without murmur Pulm: CTAB. Normal WOB on RA. No wheezing Abdomen: Soft, non-tender, non-distended. +BS MSK: TTP over lumbar spinous and paraspinous process.  Negative straight leg raise.  5/5 hip flexion bilaterally. Ext: Well perfused. Cap refill < 3 seconds Skin: Warm, dry. No rashes noted  Neuro: CN II: PERRL -endorses significant sensitivity to light CN III, IV,VI: EOMI CV V: Normal sensation in V1, V2, V3 CVII: Symmetric smile and brow raise CN VIII: Normal hearing CN IX,X: Symmetric palate raise  CN XI: 5/5 shoulder shrug CN XII: Symmetric tongue protrusion  UE and LE strength 5/5 Normal sensation in UE and LE bilaterally   ED Results and Treatments Labs (all labs ordered are listed, but only abnormal results are displayed) Labs Reviewed  CBC WITH DIFFERENTIAL/PLATELET - Abnormal; Notable for the  following components:      Result Value   WBC 3.4 (*)    Neutro Abs 1.3 (*)    All other components within normal limits  BASIC METABOLIC PANEL WITH GFR  Radiology No results found.  Pertinent labs & imaging results that were available during my care of the patient were reviewed by me and considered in my medical decision making (see MDM for details).  Medications Ordered in ED Medications  metoCLOPramide  (REGLAN ) tablet 10 mg (has no administration in time range)  sodium chloride  0.9 % bolus 1,000 mL (0 mLs Intravenous Stopped 05/01/24 1805)  prochlorperazine  (COMPAZINE ) injection 10 mg (10 mg Intravenous Given 05/01/24 1656)  morphine  (PF) 4 MG/ML injection 4 mg (4 mg Intravenous Given 05/01/24 1700)  acetaminophen  (TYLENOL ) tablet 1,000 mg (1,000 mg Oral Given 05/01/24 1632)  diphenhydrAMINE  (BENADRYL ) injection 25 mg (25 mg Intravenous Given 05/01/24 1702)  dexamethasone  (DECADRON ) injection 10 mg (10 mg Intravenous Given 05/01/24 1705)                                                                   Medical Decision Making / ED Course    Medical Decision Making:    JAYLEENE GLAESER is a 63 y.o. female with past medical history as below, significant for migraines without aura, chronic low back pain, HTN, insomnia, and recurrent syncope presenting with syncopal event. The complaint involves an extensive differential diagnosis and also carries with it a high risk of complications and morbidity.  Serious etiology was considered. Ddx includes but is not limited to: Complex migraine, dehydration, arrhythmia, seizure, CVA  Complete initial physical exam performed, notably the patient was in no distress.    Reviewed and confirmed nursing documentation for past medical history, family history, social history.  Vital signs reviewed.    63 year old female with  history as above presenting after syncopal episode in the setting of prolonged migrainous event with severe N/V x 7 days.  History consistent with prior syncopal episodes occurring during her migraine episodes and likely component of dehydration given N/V and decreased PO.  Will treat symptomatically with fluids and migraine cocktail.  No traumatic etiology and recently had CT head/neck in 02/2024, repeat imaging low yield given recurrence of chronic symptoms.  On oxycodone  20 mg every 6 hours at baseline for chronic back pain, will dose with PO Tylenol  and morphine  4 mg IV x 1.   Clinical Course as of 05/01/24 1822  Tue May 01, 2024  1724 Some improvement with medications given about 30 minutes ago. [KH]  1816 Improved, continues to have some mild nausea.  Given Reglan  10 mg p.o. x 1.  Lab work otherwise reassuring. [KH]  1821 Plan for follow-up with outpatient neurologist and send with Compazine  10 mg p.o. twice daily as needed.  ED return precautions discussed. [KH]    Clinical Course User Index [KH] Theophilus Pagan, MD     Additional history obtained: -External records from outside source obtained and reviewed including: Chart review including previous notes, labs, imaging, consultation notes including: OP Neurology notes   Lab Tests: -I ordered, reviewed, and interpreted labs.   The pertinent results include:   Labs Reviewed  CBC WITH DIFFERENTIAL/PLATELET - Abnormal; Notable for the following components:      Result Value   WBC 3.4 (*)    Neutro Abs 1.3 (*)    All other components within normal limits  BASIC METABOLIC PANEL WITH GFR    EKG   EKG  Interpretation Date/Time:  Tuesday May 01 2024 15:40:41 EST Ventricular Rate:  69 PR Interval:  157 QRS Duration:  93 QT Interval:  460 QTC Calculation: 493 R Axis:   9  Text Interpretation: Sinus rhythm Borderline prolonged QT interval No significant change since last tracing Confirmed by Patt Alm DEL [phone]) on  05/01/2024 3:53:50 PM        Medicines ordered and prescription drug management: Meds ordered this encounter  Medications   sodium chloride  0.9 % bolus 1,000 mL   prochlorperazine  (COMPAZINE ) injection 10 mg   morphine  (PF) 4 MG/ML injection 4 mg   acetaminophen  (TYLENOL ) tablet 1,000 mg   diphenhydrAMINE  (BENADRYL ) injection 25 mg   dexamethasone  (DECADRON ) injection 10 mg   metoCLOPramide  (REGLAN ) tablet 10 mg   prochlorperazine  (COMPAZINE ) 10 MG tablet    Sig: Take 1 tablet (10 mg total) by mouth 2 (two) times daily as needed for nausea or vomiting.    Dispense:  10 tablet    Refill:  0    -I have reviewed the patients home medicines and have made adjustments as needed  Cardiac Monitoring: The patient was maintained on a cardiac monitor.  I personally viewed and interpreted the cardiac monitored which showed an underlying rhythm of: NSR Continuous pulse oximetry interpreted by myself, 100% on RA.    Reevaluation: After the interventions noted above, I reevaluated the patient and found that they have improved  Co morbidities that complicate the patient evaluation  Past Medical History:  Diagnosis Date   Arthritis    Chest pain    Chronic lower back pain    right side to mid back (07/06/2017)   Family history of adverse reaction to anesthesia    daughter did; not sure happened   Gall stones    History of anaphylaxis 06/26/2014   History of kidney stones    Hypertension    Migraine    qd (07/06/2017)   Syncope and collapse 07/06/2017    sitting on the commode; developed nausea and passed out; hit head on shower and suffered a laceration; woke up in a pool of blood      Dispostion: Disposition decision including need for hospitalization was considered, and patient discharged from emergency department.    Final Clinical Impression(s) / ED Diagnoses Final diagnoses:  Intractable chronic migraine without aura and with status migrainosus  Vasovagal syncope   Dehydration        Theophilus Pagan, MD 05/01/24 TRENNA    Patt Alm Macho, MD 05/01/24 2218

## 2024-05-05 ENCOUNTER — Emergency Department (HOSPITAL_COMMUNITY)
Admission: EM | Admit: 2024-05-05 | Discharge: 2024-05-06 | Disposition: A | Discharge status: Home / Self Care | Attending: Emergency Medicine | Admitting: Emergency Medicine

## 2024-05-05 ENCOUNTER — Other Ambulatory Visit: Payer: Self-pay

## 2024-05-05 ENCOUNTER — Emergency Department (HOSPITAL_COMMUNITY)

## 2024-05-05 DIAGNOSIS — I10 Essential (primary) hypertension: Secondary | ICD-10-CM | POA: Diagnosis not present

## 2024-05-05 DIAGNOSIS — R519 Headache, unspecified: Secondary | ICD-10-CM | POA: Diagnosis present

## 2024-05-05 DIAGNOSIS — Z79899 Other long term (current) drug therapy: Secondary | ICD-10-CM | POA: Diagnosis not present

## 2024-05-05 DIAGNOSIS — G43809 Other migraine, not intractable, without status migrainosus: Secondary | ICD-10-CM | POA: Insufficient documentation

## 2024-05-05 LAB — COMPREHENSIVE METABOLIC PANEL WITH GFR
ALT: 24 U/L (ref 0–44)
AST: 34 U/L (ref 15–41)
Albumin: 3.7 g/dL (ref 3.5–5.0)
Alkaline Phosphatase: 88 U/L (ref 38–126)
Anion gap: 12 (ref 5–15)
BUN: 8 mg/dL (ref 8–23)
CO2: 28 mmol/L (ref 22–32)
Calcium: 8.7 mg/dL — ABNORMAL LOW (ref 8.9–10.3)
Chloride: 97 mmol/L — ABNORMAL LOW (ref 98–111)
Creatinine, Ser: 1.31 mg/dL — ABNORMAL HIGH (ref 0.44–1.00)
GFR, Estimated: 46 mL/min — ABNORMAL LOW (ref >60)
Glucose, Bld: 102 mg/dL — ABNORMAL HIGH (ref 70–99)
Potassium: 3.5 mmol/L (ref 3.5–5.1)
Sodium: 137 mmol/L (ref 135–145)
Total Bilirubin: 1.2 mg/dL (ref 0.0–1.2)
Total Protein: 6.9 g/dL (ref 6.5–8.1)

## 2024-05-05 LAB — I-STAT CHEM 8, ED
BUN: 8 mg/dL (ref 8–23)
Calcium, Ion: 1.15 mmol/L (ref 1.15–1.40)
Chloride: 97 mmol/L — ABNORMAL LOW (ref 98–111)
Creatinine, Ser: 1.4 mg/dL — ABNORMAL HIGH (ref 0.44–1.00)
Glucose, Bld: 95 mg/dL (ref 70–99)
HCT: 42 % (ref 36.0–46.0)
Hemoglobin: 14.3 g/dL (ref 12.0–15.0)
Potassium: 3.5 mmol/L (ref 3.5–5.1)
Sodium: 138 mmol/L (ref 135–145)
TCO2: 32 mmol/L (ref 22–32)

## 2024-05-05 LAB — ETHANOL: Alcohol, Ethyl (B): <15 mg/dL (ref <15)

## 2024-05-05 LAB — DIFFERENTIAL
Abs Immature Granulocytes: 0.02 K/uL (ref 0.00–0.07)
Basophils Absolute: 0 K/uL (ref 0.0–0.1)
Basophils Relative: 1 %
Eosinophils Absolute: 0.1 K/uL (ref 0.0–0.5)
Eosinophils Relative: 3 %
Immature Granulocytes: 1 %
Lymphocytes Relative: 43 %
Lymphs Abs: 1.9 K/uL (ref 0.7–4.0)
Monocytes Absolute: 0.4 K/uL (ref 0.1–1.0)
Monocytes Relative: 9 %
Neutro Abs: 1.9 K/uL (ref 1.7–7.7)
Neutrophils Relative %: 43 %

## 2024-05-05 LAB — APTT: aPTT: 28 s (ref 24–36)

## 2024-05-05 LAB — CBC
HCT: 42 % (ref 36.0–46.0)
Hemoglobin: 13.9 g/dL (ref 12.0–15.0)
MCH: 30.3 pg (ref 26.0–34.0)
MCHC: 33.1 g/dL (ref 30.0–36.0)
MCV: 91.5 fL (ref 80.0–100.0)
Platelets: 244 K/uL (ref 150–400)
RBC: 4.59 MIL/uL (ref 3.87–5.11)
RDW: 13.1 % (ref 11.5–15.5)
WBC: 4.3 K/uL (ref 4.0–10.5)
nRBC: 0 % (ref 0.0–0.2)

## 2024-05-05 LAB — TROPONIN I (HIGH SENSITIVITY)
Troponin I (High Sensitivity): 11 ng/L (ref <18)
Troponin I (High Sensitivity): 9 ng/L (ref <18)

## 2024-05-05 LAB — CBG MONITORING, ED: Glucose-Capillary: 89 mg/dL (ref 70–99)

## 2024-05-05 LAB — PROTIME-INR
INR: 1 (ref 0.8–1.2)
Prothrombin Time: 13.4 s (ref 11.4–15.2)

## 2024-05-05 MED ORDER — PROCHLORPERAZINE EDISYLATE 10 MG/2ML IJ SOLN
10.0000 mg | Freq: Once | INTRAMUSCULAR | Status: AC
  Administered 2024-05-05: 10 mg via INTRAVENOUS
  Filled 2024-05-05: qty 2

## 2024-05-05 MED ORDER — DIPHENHYDRAMINE HCL 50 MG/ML IJ SOLN
25.0000 mg | Freq: Once | INTRAMUSCULAR | Status: AC
  Administered 2024-05-05: 25 mg via INTRAVENOUS
  Filled 2024-05-05: qty 1

## 2024-05-05 MED ORDER — DEXAMETHASONE SOD PHOSPHATE PF 10 MG/ML IJ SOLN
10.0000 mg | Freq: Once | INTRAMUSCULAR | Status: AC
  Administered 2024-05-05: 10 mg via INTRAVENOUS

## 2024-05-05 MED ORDER — SODIUM CHLORIDE 0.9% FLUSH: 3.0000 mL | Freq: Once | INTRAVENOUS | Status: DC

## 2024-05-05 MED ORDER — ONDANSETRON 4 MG PO TBDP
4.0000 mg | ORAL_TABLET | Freq: Once | ORAL | Status: AC
  Administered 2024-05-05: 4 mg via ORAL
  Filled 2024-05-05: qty 1

## 2024-05-05 MED ORDER — SODIUM CHLORIDE 0.9 % IV BOLUS
1000.0000 mL | Freq: Once | INTRAVENOUS | Status: AC
  Administered 2024-05-05: 1000 mL via INTRAVENOUS

## 2024-05-05 NOTE — ED Provider Notes (Signed)
 [place] [facility]ERGENCY DEPARTMENT AT [facility] Provider Note   CSN: [id number] Arrival date & time: 05/05/24  8095     Patient presents with: Migraine and Loss of Consciousness   Kirsten Walsh is a 63 y.o. female.   The history is provided by the patient and medical records.  Migraine Associated symptoms include headaches.  Loss of Consciousness Associated symptoms: headaches    63 year old female with history of hypertension, cardiomyopathy, chronic daily migraines, GERD, history of seizures, presenting to the ED with headache.  Patient reports she has been having migraine headache all day.  This is nothing out of the ordinary for her.  Triage note indicates syncopal episode however she denies this.  States she is having some tingling on the right side of her face.  Also having a lot of photophobia and phonophobia with nausea but no vomiting.  These are all consistent with her migraines.  She denies any chest pain or shortness of breath.  She is not having any focal numbness or weakness.  No changes in speech or difficulty walking.  No history of prior stroke.  She is followed by neurology at [facility]-- had medications adjusted recently at appt.  Took all of them without improvement.  Did also just have MRI/MRA head/neck 2 weeks on 04/20/24 ago but results not yet given to her.  Prior to Admission medications   Medication Sig Start Date End Date Taking? Authorizing Provider  amitriptyline  (ELAVIL ) 50 MG tablet Take 1 tablet by mouth at bedtime as needed.    [provider]  Butalbital -APAP-Caffeine  50-325-40 MG capsule Take 1 capsule by mouth every 6 (six) hours as needed for headache (migraine). 09/04/20   [provider]  cetirizine (ZYRTEC) 10 MG tablet Take 10 mg by mouth daily as needed for allergies.    [provider]  cloNIDine  (CATAPRES ) 0.1 MG tablet TAKE 1 TABLET(0.1 MG) BY MOUTH TWICE DAILY 08/28/18   Lanny Maxwell, MD  Diclofenac  Sodium  3 % GEL Apply 1 application topically 2 (two) times daily as needed (pain). 09/06/20   [provider]  EPINEPHrine  0.3 mg/0.3 mL IJ SOAJ injection Inject 0.3 mg into the muscle as needed for anaphylaxis. 09/18/20   [provider]  fluticasone  (FLONASE ) 50 MCG/ACT nasal spray Place 1 spray into both nostrils daily as needed for allergies or rhinitis.    [provider]  gabapentin  (NEURONTIN ) 300 MG capsule Take 300 mg by mouth 2 (two) times daily. 08/26/20   [provider]  hydrochlorothiazide  (HYDRODIURIL ) 25 MG tablet TAKE 1 TABLET(25 MG) BY MOUTH DAILY 08/28/18   Lanny Maxwell, MD  lidocaine  (XYLOCAINE ) 5 % ointment Apply 1 Application topically daily as needed for moderate pain. 10/19/21   [provider]  lisinopril -hydrochlorothiazide  (ZESTORETIC ) 20-12.5 MG tablet Take 1 tablet by mouth daily. Patient not taking: Reported on 12/16/2023 12/28/12   [provider]  naloxone [facility]) nasal spray 4 mg/0.1 mL SMARTSIG:Spray(s) Both Nares Patient not taking: Reported on 12/16/2023 08/24/22   [provider]  ondansetron  (ZOFRAN ) 4 MG tablet Take 4 mg by mouth 3 (three) times daily as needed for nausea or vomiting.    [provider]  oxyCODONE  (ROXICODONE ) 15 MG immediate release tablet Take 15 mg by mouth every 6 (six) hours. 09/04/20   [provider]  oxyCODONE -acetaminophen  (PERCOCET) 10-325 MG tablet Take by mouth. Patient not taking: Reported on 12/16/2023 12/28/12   [provider]  pantoprazole  (PROTONIX ) 40 MG tablet Take 40 mg by  mouth in the morning.    [provider]  Phendimetrazine  Tartrate 105 MG CP24 Take 105 mg by mouth daily as needed (weight loss). Last dose approx 12/22/20 per pt 09/21/20   [provider]  phentermine (ADIPEX-P) 37.5 MG tablet Take by mouth. 07/13/22   [provider]  polyethylene glycol (MIRALAX  / GLYCOLAX ) 17 g packet Take 17 g by mouth daily as needed for  moderate constipation.    [provider]  potassium chloride  (KLOR-CON ) 10 MEQ tablet Take 2 tablets (20 mEq total) by mouth daily. 02/20/20   Hope Merle, MD  prochlorperazine  (COMPAZINE ) 10 MG tablet Take 1 tablet (10 mg total) by mouth 2 (two) times daily as needed for nausea or vomiting. 05/01/24   Theophilus Pagan, MD  promethazine  (PHENERGAN ) 25 MG tablet Take 1 tablet (25 mg total) by mouth every 6 (six) hours as needed for nausea or vomiting. 04/30/19   Wieters, Hallie C, PA-C  senna-docusate (SENOKOT-S) 8.6-50 MG tablet Take 2 tablets by mouth 2 (two) times daily. 12/19/21   Akula, Vijaya, MD  SUMAtriptan  (IMITREX ) 100 MG tablet Take 100 mg by mouth every 2 (two) hours as needed for migraine. May repeat in 2 hours if headache persists or recurs.    [provider]  tiZANidine  (ZANAFLEX ) 4 MG capsule Take 1 capsule (4 mg total) by mouth at bedtime as needed for muscle spasms. 12/19/21   Akula, Vijaya, MD  traZODone  (DESYREL ) 100 MG tablet Take 100 mg by mouth at bedtime as needed for sleep. Patient not taking: Reported on 12/16/2023 12/07/21   [provider]  Vitamin D , Ergocalciferol , (DRISDOL ) 1.25 MG (50000 UNIT) CAPS capsule Take 50,000 Units by mouth every Monday. 09/14/20   [provider]    Allergies: Apple, Aspirin, Daucus carota, and Haloperidol  and related    Review of Systems  Cardiovascular:  Positive for syncope.  Neurological:  Positive for headaches.  All other systems reviewed and are negative.   Updated Vital Signs BP (!) 154/86   Pulse 82   Temp 98.7 F (37.1 C) (Oral)   Resp 14   Ht 5' 5 (1.651 m)   Wt 82.6 kg   SpO2 100%   BMI 30.30 kg/m   Physical Exam Vitals and nursing note reviewed.  Constitutional:      General: She is not in acute distress.    Appearance: She is well-developed. She is not diaphoretic.  HENT:     Head: Normocephalic and atraumatic.     Right Ear: External ear normal.     Left Ear: External ear  normal.  Eyes:     Conjunctiva/sclera: Conjunctivae normal.     Pupils: Pupils are equal, round, and reactive to light.     Comments: Obvious photophobia on exam  Neck:     Comments: No rigidity, no meningismus Cardiovascular:     Rate and Rhythm: Normal rate and regular rhythm.     Heart sounds: Normal heart sounds. No murmur heard. Pulmonary:     Effort: Pulmonary effort is normal. No respiratory distress.     Breath sounds: Normal breath sounds. No wheezing or rhonchi.  Abdominal:     General: Bowel sounds are normal.     Palpations: Abdomen is soft.     Tenderness: There is no abdominal tenderness. There is no guarding.  Musculoskeletal:        General: Normal range of motion.     Cervical back: Full passive range of motion without pain, normal range  of motion and neck supple. No rigidity.  Skin:    General: Skin is warm and dry.     Findings: No rash.  Neurological:     Mental Status: She is alert and oriented to person, place, and time.     Cranial Nerves: No cranial nerve deficit.     Sensory: No sensory deficit.     Motor: No tremor or seizure activity.     Comments: AAOx3, answering questions and following commands appropriately; equal strength UE and LE bilaterally; CN grossly intact; moves all extremities appropriately without ataxia; no focal neuro deficits or facial asymmetry appreciated  Psychiatric:        Behavior: Behavior normal.        Thought Content: Thought content normal.     (all labs ordered are listed, but only abnormal results are displayed) Labs Reviewed  COMPREHENSIVE METABOLIC PANEL WITH GFR - Abnormal; Notable for the following components:      Result Value   Chloride 97 (*)    Glucose, Bld 102 (*)    Creatinine, Ser 1.31 (*)    Calcium 8.7 (*)    GFR, Estimated 46 (*)    All other components within normal limits  I-STAT CHEM 8, ED - Abnormal; Notable for the following components:   Chloride 97 (*)    Creatinine, Ser 1.40 (*)    All other  components within normal limits  PROTIME-INR  APTT  CBC  DIFFERENTIAL  ETHANOL  CBG MONITORING, ED  TROPONIN I (HIGH SENSITIVITY)  TROPONIN I (HIGH SENSITIVITY)    EKG: EKG Interpretation Date/Time:  Saturday May 05 2024 19:52:36 EST Ventricular Rate:  83 PR Interval:  148 QRS Duration:  78 QT Interval:  402 QTC Calculation: 472 R Axis:   23  Text Interpretation: Normal sinus rhythm Abnormal ECG When compared with ECG of 01-May-2024 15:40, PREVIOUS ECG IS PRESENT similar to prior no stemi Confirmed by Elnor Savant ([id number]) on 05/05/2024 9:32:04 PM  Radiology: CT Cervical Spine Wo Contrast Result Date: 05/05/2024 CLINICAL DATA:  Neck trauma, syncope EXAM: CT CERVICAL SPINE WITHOUT CONTRAST TECHNIQUE: Multidetector CT imaging of the cervical spine was performed without intravenous contrast. Multiplanar CT image reconstructions were also generated. RADIATION DOSE REDUCTION: This exam was performed according to the departmental dose-optimization program which includes automated exposure control, adjustment of the mA and/or kV according to patient size and/or use of iterative reconstruction technique. COMPARISON:  03/13/2024 FINDINGS: Alignment: Alignment is grossly anatomic. Skull base and vertebrae: No acute fracture. No primary bone lesion or focal pathologic process. Soft tissues and spinal canal: No prevertebral fluid or swelling. No visible canal hematoma. Disc levels:  Stable spondylosis at C5-6 and C6-7. Upper chest: Airway is patent. Visualized portions of the lung apices are clear. Other: Reconstructed images demonstrate no additional findings. IMPRESSION: 1. No acute cervical spine fracture. 2. Stable lower cervical spondylosis. Electronically Signed   By: Ozell Daring M.D.   On: 05/05/2024 20:41   CT HEAD WO CONTRAST Result Date: 05/05/2024 CLINICAL DATA:  Syncope/presyncope, cerebrovascular cause suspected EXAM: CT HEAD WITHOUT CONTRAST TECHNIQUE: Contiguous axial images  were obtained from the base of the skull through the vertex without intravenous contrast. RADIATION DOSE REDUCTION: This exam was performed according to the departmental dose-optimization program which includes automated exposure control, adjustment of the mA and/or kV according to patient size and/or use of iterative reconstruction technique. COMPARISON:  03/13/2024 FINDINGS: Brain: No acute infarct or hemorrhage. Lateral ventricles and midline structures are stable. Cavum septum pellucidum  again noted, an anatomic variant. No acute extra-axial fluid collections. No mass effect. Vascular: No hyperdense vessel or unexpected calcification. Skull: Normal. Negative for fracture or focal lesion. Sinuses/Orbits: No acute finding. Other: None. IMPRESSION: 1. Stable head CT, no acute intracranial process. Electronically Signed   By: Ozell Daring M.D.   On: 05/05/2024 20:37     Procedures   Medications Ordered in the ED  sodium chloride  flush (NS) 0.9 % injection 3 mL (3 mLs Intravenous Not Given 05/05/24 1935)  ondansetron  (ZOFRAN -ODT) disintegrating tablet 4 mg (4 mg Oral Given 05/05/24 2016)                                    Medical Decision Making Amount and/or Complexity of Data Reviewed Labs: ordered. Radiology: ordered and independent interpretation performed. ECG/medicine tests: ordered and independent interpretation performed.  Risk Prescription drug management.   63 year old female here with headache.  Triage note indicates syncopal episode, however she denies this.  She does have a history of chronic migraines.  Followed by neurology at [facility].  She is afebrile and nontoxic in appearance here.  She does not have any focal neurologic deficits or meningeal signs on exam.  She does have obvious photophobia.  CT head and neck obtained from triage which is negative.  Lab work is grossly reassuring.  Seen here 2 days ago and received migraine cocktail which helped significant with with her  symptoms, will plan to repeat and reassess.  2:32 AM Pain down to 5/10.  Patient sleeping comfortably on repeat assessment.  Appears stable for discharge.  Will need to follow-up with her neurology team.  Encouraged to continue usual migraine regimen.  Can return here for new concerns.  Final diagnoses:  Other migraine without status migrainosus, not intractable    ED Discharge Orders     None          Jarold Olam HERO, PA-C 05/06/24 0316    Elnor Jayson LABOR, DO 05/11/24 1557

## 2024-05-05 NOTE — ED Triage Notes (Signed)
 Patient BIB [facility] from home where she had a syncopal episode just prior to arrival. Per the patient she was leaning over her bed, complaining of R sided lower back and leg pain. She was witnessed by her family having a syncopal episode, lost consciousness for about 5 seconds, hit the R side of her face during the fall. No blood thinners. Patient has an extensive hx of migraines. Reports a migraine over the last several days, woke up this morning with intense head pain, seeing silver specks in her vision. Currently experiencing photosensitivity. Is sluggish to respond to questioning. Aox3 (unable to recall events from earlier) stating she did not fall.

## 2024-05-05 NOTE — ED Provider Triage Note (Signed)
 Emergency Medicine Provider Triage Evaluation Note  Kirsten Walsh , a 63 y.o. female  was evaluated in triage.  Pt complains of headaches with possible syncopal episode.  Patient has a well-established history of headaches with syncopal episodes.  Patient does not feel like she has syncopal episode today.  Reports that she was lying on the bed when she thought that she was having a syncopal episode.  Reports that she is having her chronic right hip pain.  Reports that she is having some tingling of the right side of her face.  Having some photophobia and phonophobia with some nausea.  Reports this is consistent with her migraines.  Reports daily migraine.  Denies any chest pain or shortness of breath..  Review of Systems  Positive:  Negative:   Physical Exam  BP (!) 136/120 (BP Location: Right Arm)   Pulse 88   Temp 98.7 F (37.1 C) (Oral)   Resp 16   Ht 5' 5 (1.651 m)   Wt 82.6 kg   SpO2 100%   BMI 30.30 kg/m  Gen:   Awake, no distress   Resp:  Normal effort  MSK:   Moves extremities without difficulty  Other:  Palpable muscle spasm on the left trap.  No midline tenderness.  Cranial nerves grossly intact.  Strength intact in upper and lower bilateral extremities.  Difficulty to see patient's pupils given colored contacts and her photophobia.  Medical Decision Making  Medically screening exam initiated at 7:58 PM.  Appropriate orders placed.  Kirsten Walsh was informed that the remainder of the evaluation will be completed by another provider, this initial triage assessment does not replace that evaluation, and the importance of remaining in the ED until their evaluation is complete.  Imaging and labs ordered.  Nonfocal exam. Nausea meds ordered. Patient has multiple visits consistent with today's episode.    Bernis Ernst, PA-C 05/05/24 2001

## 2024-05-06 MED ORDER — MAGNESIUM SULFATE 2 GM/50ML IV SOLN
2.0000 g | Freq: Once | INTRAVENOUS | Status: AC
  Administered 2024-05-06: 2 g via INTRAVENOUS
  Filled 2024-05-06: qty 50

## 2024-05-06 MED ORDER — METOCLOPRAMIDE HCL 5 MG/ML IJ SOLN
10.0000 mg | INTRAMUSCULAR | Status: AC
  Administered 2024-05-06: 10 mg via INTRAVENOUS
  Filled 2024-05-06: qty 2

## 2024-05-06 NOTE — Discharge Instructions (Addendum)
 Continue your usual migraine regimen. Be sure to follow-up with your neurology team. Return here for new concerns.

## 2024-06-16 ENCOUNTER — Emergency Department (HOSPITAL_COMMUNITY)
Admission: EM | Admit: 2024-06-16 | Discharge: 2024-06-16 | Disposition: A | Discharge status: Home / Self Care | Attending: Emergency Medicine | Admitting: Emergency Medicine

## 2024-06-16 ENCOUNTER — Other Ambulatory Visit: Payer: Self-pay

## 2024-06-16 ENCOUNTER — Emergency Department (HOSPITAL_COMMUNITY)

## 2024-06-16 ENCOUNTER — Encounter (HOSPITAL_COMMUNITY): Payer: Self-pay

## 2024-06-16 DIAGNOSIS — R251 Tremor, unspecified: Secondary | ICD-10-CM | POA: Insufficient documentation

## 2024-06-16 DIAGNOSIS — R519 Headache, unspecified: Secondary | ICD-10-CM | POA: Insufficient documentation

## 2024-06-16 DIAGNOSIS — Y9222 Religious institution as the place of occurrence of the external cause: Secondary | ICD-10-CM | POA: Insufficient documentation

## 2024-06-16 DIAGNOSIS — R55 Syncope and collapse: Secondary | ICD-10-CM | POA: Diagnosis not present

## 2024-06-16 DIAGNOSIS — R569 Unspecified convulsions: Secondary | ICD-10-CM | POA: Insufficient documentation

## 2024-06-16 DIAGNOSIS — W19XXXA Unspecified fall, initial encounter: Secondary | ICD-10-CM | POA: Diagnosis not present

## 2024-06-16 LAB — COMPREHENSIVE METABOLIC PANEL WITH GFR
ALT: 12 U/L (ref 0–44)
AST: 21 U/L (ref 15–41)
Albumin: 3.8 g/dL (ref 3.5–5.0)
Alkaline Phosphatase: 91 U/L (ref 38–126)
Anion gap: 8 (ref 5–15)
BUN: 11 mg/dL (ref 8–23)
CO2: 32 mmol/L (ref 22–32)
Calcium: 9 mg/dL (ref 8.9–10.3)
Chloride: 97 mmol/L — ABNORMAL LOW (ref 98–111)
Creatinine, Ser: 1.08 mg/dL — ABNORMAL HIGH (ref 0.44–1.00)
GFR, Estimated: 58 mL/min — ABNORMAL LOW
Glucose, Bld: 97 mg/dL (ref 70–99)
Potassium: 3.1 mmol/L — ABNORMAL LOW (ref 3.5–5.1)
Sodium: 137 mmol/L (ref 135–145)
Total Bilirubin: 0.3 mg/dL (ref 0.0–1.2)
Total Protein: 6.3 g/dL — ABNORMAL LOW (ref 6.5–8.1)

## 2024-06-16 LAB — URINALYSIS, ROUTINE W REFLEX MICROSCOPIC
Bilirubin Urine: NEGATIVE
Glucose, UA: NEGATIVE mg/dL
Hgb urine dipstick: NEGATIVE
Ketones, ur: NEGATIVE mg/dL
Nitrite: NEGATIVE
Protein, ur: NEGATIVE mg/dL
Specific Gravity, Urine: 1.009 (ref 1.005–1.030)
pH: 7 (ref 5.0–8.0)

## 2024-06-16 LAB — URINE DRUG SCREEN
Amphetamines: NEGATIVE
Barbiturates: NEGATIVE
Benzodiazepines: NEGATIVE
Cocaine: NEGATIVE
Fentanyl: NEGATIVE
Methadone Scn, Ur: NEGATIVE
Opiates: POSITIVE — AB
Tetrahydrocannabinol: NEGATIVE

## 2024-06-16 LAB — CBC WITH DIFFERENTIAL/PLATELET
Abs Immature Granulocytes: 0.01 K/uL (ref 0.00–0.07)
Basophils Absolute: 0 K/uL (ref 0.0–0.1)
Basophils Relative: 1 %
Eosinophils Absolute: 0.1 K/uL (ref 0.0–0.5)
Eosinophils Relative: 2 %
HCT: 35.1 % — ABNORMAL LOW (ref 36.0–46.0)
Hemoglobin: 11.6 g/dL — ABNORMAL LOW (ref 12.0–15.0)
Immature Granulocytes: 0 %
Lymphocytes Relative: 44 %
Lymphs Abs: 1.8 K/uL (ref 0.7–4.0)
MCH: 30.5 pg (ref 26.0–34.0)
MCHC: 33 g/dL (ref 30.0–36.0)
MCV: 92.4 fL (ref 80.0–100.0)
Monocytes Absolute: 0.3 K/uL (ref 0.1–1.0)
Monocytes Relative: 8 %
Neutro Abs: 1.8 K/uL (ref 1.7–7.7)
Neutrophils Relative %: 45 %
Platelets: 230 K/uL (ref 150–400)
RBC: 3.8 MIL/uL — ABNORMAL LOW (ref 3.87–5.11)
RDW: 12.9 % (ref 11.5–15.5)
WBC: 4.1 K/uL (ref 4.0–10.5)
nRBC: 0 % (ref 0.0–0.2)

## 2024-06-16 LAB — ETHANOL: Alcohol, Ethyl (B): <15 mg/dL

## 2024-06-16 MED ORDER — SODIUM CHLORIDE 0.9 % IV BOLUS
1000.0000 mL | Freq: Once | INTRAVENOUS | Status: AC
  Administered 2024-06-16: 1000 mL via INTRAVENOUS

## 2024-06-16 MED ORDER — FAMOTIDINE IN NACL 20-0.9 MG/50ML-% IV SOLN
20.0000 mg | Freq: Once | INTRAVENOUS | Status: AC
  Administered 2024-06-16: 20 mg via INTRAVENOUS
  Filled 2024-06-16: qty 50

## 2024-06-16 MED ORDER — HYDROMORPHONE HCL 1 MG/ML IJ SOLN
1.0000 mg | Freq: Once | INTRAMUSCULAR | Status: AC
  Administered 2024-06-16: 1 mg via INTRAVENOUS
  Filled 2024-06-16: qty 1

## 2024-06-16 MED ORDER — ONDANSETRON HCL 4 MG/2ML IJ SOLN
4.0000 mg | Freq: Once | INTRAMUSCULAR | Status: AC
  Administered 2024-06-16: 4 mg via INTRAVENOUS
  Filled 2024-06-16: qty 2

## 2024-06-16 MED ORDER — OXYCODONE-ACETAMINOPHEN 5-325 MG PO TABS
1.0000 | ORAL_TABLET | Freq: Once | ORAL | Status: AC
  Administered 2024-06-16: 1 via ORAL
  Filled 2024-06-16: qty 1

## 2024-06-16 MED ORDER — METOCLOPRAMIDE HCL 5 MG/ML IJ SOLN
5.0000 mg | Freq: Once | INTRAMUSCULAR | Status: AC
  Administered 2024-06-16: 5 mg via INTRAVENOUS
  Filled 2024-06-16: qty 2

## 2024-06-16 MED ORDER — MORPHINE SULFATE (PF) 4 MG/ML IV SOLN
4.0000 mg | Freq: Once | INTRAVENOUS | Status: AC
  Administered 2024-06-16: 4 mg via INTRAVENOUS
  Filled 2024-06-16: qty 1

## 2024-06-16 NOTE — ED Triage Notes (Signed)
 Pt was at church cleaning with other church members when she started not feeling well. Pt told another woman she did not feel well so woman sat her down and went for help. When she came back pt was standing in doorway and said she felt like she was about to pass out. Woman at church told EMS she fell right after and was unable to brace her fall. Pt fell hitting her head on the floor and started having full body convulsions along with eye twitching. Witness states this lasted for about 2-3 minutes followed by pt going unresponsive. EMS arrived and said pt presented lethargic, stroke screen was negative. Pt has a Hx of migraines. Significant other arrived on scene and reports pt had a similar episode a couple weeks ago and was evaluated but the cause was still unknown at this time.

## 2024-06-16 NOTE — Discharge Instructions (Signed)
 You have been seen and discharged from the emergency department.  Your blood work and CT imaging was normal here.  Please follow-up with your neurologist outpatient for further seizure testing and medication management.  Follow-up with your primary provider for further evaluation and further care. Take home medications as prescribed. If you have any worsening symptoms or further concerns for your health please return to an emergency department for further evaluation.

## 2024-06-16 NOTE — ED Provider Notes (Signed)
 " [facility] Provider Note   CSN: [id number] Arrival date & time: 06/16/24  1654     Patient presents with: Seizures   Kirsten Walsh is a 63 y.o. female.   HPI   63 year old female presents emergency department with reported seizure-like activity.  Patient was reportedly cleaning out of church when she fell back, lost consciousness, had whole body shaking.  No incontinence or tongue biting.  Patient has history of severe migraines associated with syncope.  Follows with outpatient neurology at [facility], Dr. Ursula.  On initial evaluation she is laying flat, c-collar in place, flickering her eyes but responsive and conversational.  Mainly complaining of headache.  Prior to Admission medications  Medication Sig Start Date End Date Taking? Authorizing Provider  amitriptyline  (ELAVIL ) 50 MG tablet Take 1 tablet by mouth at bedtime as needed.    [provider]  Butalbital -APAP-Caffeine  50-325-40 MG capsule Take 1 capsule by mouth every 6 (six) hours as needed for headache (migraine). 09/04/20   [provider]  cetirizine (ZYRTEC) 10 MG tablet Take 10 mg by mouth daily as needed for allergies.    [provider]  cloNIDine  (CATAPRES ) 0.1 MG tablet TAKE 1 TABLET(0.1 MG) BY MOUTH TWICE DAILY 08/28/18   Lanny Maxwell, MD  Diclofenac  Sodium 3 % GEL Apply 1 application topically 2 (two) times daily as needed (pain). 09/06/20   [provider]  EPINEPHrine  0.3 mg/0.3 mL IJ SOAJ injection Inject 0.3 mg into the muscle as needed for anaphylaxis. 09/18/20   [provider]  fluticasone  (FLONASE ) 50 MCG/ACT nasal spray Place 1 spray into both nostrils daily as needed for allergies or rhinitis.    [provider]  gabapentin  (NEURONTIN ) 300 MG capsule Take 300 mg by mouth 2 (two) times daily. 08/26/20   [provider]  hydrochlorothiazide  (HYDRODIURIL ) 25 MG tablet TAKE 1 TABLET(25 MG) BY MOUTH DAILY  08/28/18   Lanny Maxwell, MD  lidocaine  (XYLOCAINE ) 5 % ointment Apply 1 Application topically daily as needed for moderate pain. 10/19/21   [provider]  lisinopril -hydrochlorothiazide  (ZESTORETIC ) 20-12.5 MG tablet Take 1 tablet by mouth daily. Patient not taking: Reported on 12/16/2023 12/28/12   [provider]  naloxone [facility]) nasal spray 4 mg/0.1 mL SMARTSIG:Spray(s) Both Nares Patient not taking: Reported on 12/16/2023 08/24/22   [provider]  ondansetron  (ZOFRAN ) 4 MG tablet Take 4 mg by mouth 3 (three) times daily as needed for nausea or vomiting.    [provider]  oxyCODONE  (ROXICODONE ) 15 MG immediate release tablet Take 15 mg by mouth every 6 (six) hours. 09/04/20   [provider]  oxyCODONE -acetaminophen  (PERCOCET) 10-325 MG tablet Take by mouth. Patient not taking: Reported on 12/16/2023 12/28/12   [provider]  pantoprazole  (PROTONIX ) 40 MG tablet Take 40 mg by mouth in the morning.    [provider]  Phendimetrazine  Tartrate 105 MG CP24 Take 105 mg by mouth daily as needed (weight loss). Last dose approx 12/22/20 per pt 09/21/20   [provider]  phentermine (ADIPEX-P) 37.5 MG tablet Take by mouth. 07/13/22   [provider]  polyethylene glycol (MIRALAX  / GLYCOLAX ) 17 g packet Take 17 g by mouth daily as needed for moderate constipation.    [provider]  potassium chloride  (KLOR-CON ) 10 MEQ tablet Take 2 tablets (20 mEq total) by mouth daily. 02/20/20   Hope Merle, MD  prochlorperazine  (COMPAZINE ) 10 MG tablet Take 1 tablet (10 mg total)  by mouth 2 (two) times daily as needed for nausea or vomiting. 05/01/24   Theophilus Pagan, MD  promethazine  (PHENERGAN ) 25 MG tablet Take 1 tablet (25 mg total) by mouth every 6 (six) hours as needed for nausea or vomiting. 04/30/19   Wieters, Hallie C, PA-C  senna-docusate (SENOKOT-S) 8.6-50 MG tablet Take 2 tablets by mouth 2 (two) times daily. 12/19/21    Akula, Vijaya, MD  SUMAtriptan  (IMITREX ) 100 MG tablet Take 100 mg by mouth every 2 (two) hours as needed for migraine. May repeat in 2 hours if headache persists or recurs.    [provider]  tiZANidine  (ZANAFLEX ) 4 MG capsule Take 1 capsule (4 mg total) by mouth at bedtime as needed for muscle spasms. 12/19/21   Akula, Vijaya, MD  traZODone  (DESYREL ) 100 MG tablet Take 100 mg by mouth at bedtime as needed for sleep. Patient not taking: Reported on 12/16/2023 12/07/21   [provider]  Vitamin D , Ergocalciferol , (DRISDOL ) 1.25 MG (50000 UNIT) CAPS capsule Take 50,000 Units by mouth every Monday. 09/14/20   [provider]    Allergies: Apple, Aspirin, Daucus carota, and Haloperidol  and related    Review of Systems  Constitutional:  Negative for fever.  Respiratory:  Negative for shortness of breath.   Cardiovascular:  Negative for chest pain.  Gastrointestinal:  Negative for abdominal pain, diarrhea and vomiting.  Skin:  Negative for rash.  Neurological:  Positive for seizures, syncope and headaches.    Updated Vital Signs BP 128/70   Pulse 73   Temp 97.8 F (36.6 C) (Oral)   Resp 15   Ht 5' 5 (1.651 m)   Wt 78 kg   LMP  (LMP Unknown)   SpO2 100%   BMI 28.62 kg/m   Physical Exam Vitals and nursing note reviewed.  Constitutional:      Appearance: Normal appearance.  HENT:     Head: Normocephalic.     Mouth/Throat:     Mouth: Mucous membranes are moist.  Eyes:     Extraocular Movements: Extraocular movements intact.     Pupils: Pupils are equal, round, and reactive to light.  Neck:     Comments: C-collar in place Cardiovascular:     Rate and Rhythm: Normal rate.  Pulmonary:     Effort: Pulmonary effort is normal. No respiratory distress.  Abdominal:     Palpations: Abdomen is soft.     Tenderness: There is no abdominal tenderness.  Skin:    General: Skin is warm.  Neurological:     General: No focal deficit present.     Mental Status:  She is alert and oriented to person, place, and time. Mental status is at baseline.  Psychiatric:        Mood and Affect: Mood normal.     (all labs ordered are listed, but only abnormal results are displayed) Labs Reviewed  CBC WITH DIFFERENTIAL/PLATELET - Abnormal; Notable for the following components:      Result Value   RBC 3.80 (*)    Hemoglobin 11.6 (*)    HCT 35.1 (*)    All other components within normal limits  COMPREHENSIVE METABOLIC PANEL WITH GFR - Abnormal; Notable for the following components:   Potassium 3.1 (*)    Chloride 97 (*)    Creatinine, Ser 1.08 (*)    Total Protein 6.3 (*)    GFR, Estimated 58 (*)    All other components within normal limits  URINALYSIS, ROUTINE W REFLEX MICROSCOPIC - Abnormal;  Notable for the following components:   Leukocytes,Ua LARGE (*)    Bacteria, UA FEW (*)    All other components within normal limits  URINE DRUG SCREEN - Abnormal; Notable for the following components:   Opiates POSITIVE (*)    All other components within normal limits  ETHANOL    EKG: EKG Interpretation Date/Time:  Saturday June 16 2024 19:24:39 EST Ventricular Rate:  70 PR Interval:  164 QRS Duration:  105 QT Interval:  453 QTC Calculation: 489 R Axis:   22  Text Interpretation: Sinus rhythm Borderline T abnormalities, anterior leads Borderline prolonged QT interval Confirmed by Bari Flank [phone]) on 06/16/2024 7:44:54 PM  Radiology: CT Cervical Spine Wo Contrast Result Date: 06/16/2024 CLINICAL DATA:  Status post trauma. EXAM: CT CERVICAL SPINE WITHOUT CONTRAST TECHNIQUE: Multidetector CT imaging of the cervical spine was performed without intravenous contrast. Multiplanar CT image reconstructions were also generated. RADIATION DOSE REDUCTION: This exam was performed according to the departmental dose-optimization program which includes automated exposure control, adjustment of the mA and/or kV according to patient size and/or use of iterative  reconstruction technique. COMPARISON:  May 05, 2024 FINDINGS: Alignment: There is straightening of the normal cervical spine lordosis. Skull base and vertebrae: No acute fracture. No primary bone lesion or focal pathologic process. Soft tissues and spinal canal: No prevertebral fluid or swelling. No visible canal hematoma. Disc levels: Mild to moderate severity endplate sclerosis, anterior osteophyte formation and posterior bony spurring are seen at the levels of C5-C6 and C6-C7. Moderate severity intervertebral disc space narrowing is seen at C5-C6 and C6-C7, with mild intervertebral disc space narrowing present throughout the remainder of the cervical spine. There is mild to moderate severity bilateral multilevel facet joint hypertrophy. Upper chest: Negative. Other: None. IMPRESSION: 1. No acute fracture or subluxation in the cervical spine. 2. Moderate severity degenerative changes at the levels of C5-C6 and C6-C7. Electronically Signed   By: Suzen Dials M.D.   On: 06/16/2024 22:08   CT Head Wo Contrast Result Date: 06/16/2024 CLINICAL DATA:  Status post trauma. EXAM: CT HEAD WITHOUT CONTRAST TECHNIQUE: Contiguous axial images were obtained from the base of the skull through the vertex without intravenous contrast. RADIATION DOSE REDUCTION: This exam was performed according to the departmental dose-optimization program which includes automated exposure control, adjustment of the mA and/or kV according to patient size and/or use of iterative reconstruction technique. COMPARISON:  May 05, 2024 FINDINGS: Brain: No evidence of acute infarction, hemorrhage, hydrocephalus, extra-axial collection or mass lesion/mass effect. Vascular: No hyperdense vessel or unexpected calcification. Skull: Normal. Negative for fracture or focal lesion. Sinuses/Orbits: No acute finding. Other: None. IMPRESSION: No acute intracranial pathology. Electronically Signed   By: Suzen Dials M.D.   On: 06/16/2024 22:07    DG Ankle Complete Right Result Date: 06/16/2024 EXAM: 3 or more VIEW(S) XRAY OF THE ANKLE 06/16/2024 06:45:00 PM CLINICAL HISTORY: injury COMPARISON: None available. FINDINGS: BONES AND JOINTS: Lucency along the medial talar dome could represent an osteochondral defect. This will be better assessed with MRI examination. No ankle effusion. Small plantar calcaneal spur. No acute fracture. No malalignment. SOFT TISSUES: Soft tissue swelling at the lateral malleolus. IMPRESSION: 1. Lucency along the medial talar dome, which could represent an osteochondral defect, better assessed with MRI. 2. Soft tissue swelling at the lateral malleolus. Electronically signed by: Dorethia Molt MD 06/16/2024 08:05 PM EST RP Workstation: [id number]     Procedures   Medications Ordered in the ED  sodium chloride  0.9 % bolus  1,000 mL (0 mLs Intravenous Stopped 06/16/24 1850)  ondansetron  (ZOFRAN ) injection 4 mg (4 mg Intravenous Given 06/16/24 1813)  metoCLOPramide  (REGLAN ) injection 5 mg (5 mg Intravenous Given 06/16/24 1823)  famotidine  (PEPCID ) IVPB 20 mg premix (0 mg Intravenous Stopped 06/16/24 1925)  morphine  (PF) 4 MG/ML injection 4 mg (4 mg Intravenous Given 06/16/24 1821)  HYDROmorphone  (DILAUDID ) injection 1 mg (1 mg Intravenous Given 06/16/24 2003)                                    Medical Decision Making Amount and/or Complexity of Data Reviewed Labs: ordered. Radiology: ordered.  Risk Prescription drug management.   63 year old female presents emergency department after seizure-like activity.  Has been seen multiple times before for severe migraines with syncope.  Follows with neurology as an outpatient.  Per their last note there is plans for EEG testing.  Vitals are normal and stable.  She has no focal neurodeficit.  Blood work is normal.  CT imaging of the head and neck are unremarkable, c-collar was cleared.  I consulted with our neurologist, Dr. Vanessa.  We thought we could  transition her to Topamax  for seizure presents tension and migraine control.  After discussing this with the patient she had previously been on Topamax  and had been taken off it.  She would prefer to not have any acute changes in her medication.  She will call her neurologist on Monday which was the plan anyway for outpatient testing and medication evaluation.  Patient at this time appears safe and stable for discharge and close outpatient follow up. Discharge plan and strict return to ED precautions discussed, patient verbalizes understanding and agreement.     Final diagnoses:  Seizure-like activity [facility])    ED Discharge Orders     None          Bari Roxie HERO, DO 06/16/24 2347  "

## 2024-06-25 ENCOUNTER — Other Ambulatory Visit: Payer: Self-pay

## 2024-06-25 ENCOUNTER — Emergency Department (HOSPITAL_BASED_OUTPATIENT_CLINIC_OR_DEPARTMENT_OTHER)

## 2024-06-25 ENCOUNTER — Emergency Department (HOSPITAL_BASED_OUTPATIENT_CLINIC_OR_DEPARTMENT_OTHER): Admission: EM | Admit: 2024-06-25 | Discharge: 2024-06-25 | Discharge status: Against Medical Advice

## 2024-06-25 ENCOUNTER — Encounter (HOSPITAL_BASED_OUTPATIENT_CLINIC_OR_DEPARTMENT_OTHER): Payer: Self-pay

## 2024-06-25 DIAGNOSIS — R569 Unspecified convulsions: Secondary | ICD-10-CM

## 2024-06-25 DIAGNOSIS — R519 Headache, unspecified: Secondary | ICD-10-CM | POA: Diagnosis present

## 2024-06-25 DIAGNOSIS — I10 Essential (primary) hypertension: Secondary | ICD-10-CM | POA: Diagnosis not present

## 2024-06-25 DIAGNOSIS — G4700 Insomnia, unspecified: Secondary | ICD-10-CM | POA: Diagnosis not present

## 2024-06-25 DIAGNOSIS — R55 Syncope and collapse: Secondary | ICD-10-CM | POA: Diagnosis not present

## 2024-06-25 DIAGNOSIS — Z79899 Other long term (current) drug therapy: Secondary | ICD-10-CM | POA: Insufficient documentation

## 2024-06-25 DIAGNOSIS — E876 Hypokalemia: Secondary | ICD-10-CM | POA: Diagnosis not present

## 2024-06-25 DIAGNOSIS — Z5329 Procedure and treatment not carried out because of patient's decision for other reasons: Secondary | ICD-10-CM | POA: Insufficient documentation

## 2024-06-25 DIAGNOSIS — Z96652 Presence of left artificial knee joint: Secondary | ICD-10-CM

## 2024-06-25 DIAGNOSIS — G43911 Migraine, unspecified, intractable, with status migrainosus: Secondary | ICD-10-CM | POA: Diagnosis present

## 2024-06-25 DIAGNOSIS — G8929 Other chronic pain: Secondary | ICD-10-CM

## 2024-06-25 LAB — CBC WITH DIFFERENTIAL/PLATELET
Abs Immature Granulocytes: 0.01 K/uL (ref 0.00–0.07)
Basophils Absolute: 0 K/uL (ref 0.0–0.1)
Basophils Relative: 1 %
Eosinophils Absolute: 0.1 K/uL (ref 0.0–0.5)
Eosinophils Relative: 2 %
HCT: 38.2 % (ref 36.0–46.0)
Hemoglobin: 12.8 g/dL (ref 12.0–15.0)
Immature Granulocytes: 0 %
Lymphocytes Relative: 34 %
Lymphs Abs: 1.5 K/uL (ref 0.7–4.0)
MCH: 30 pg (ref 26.0–34.0)
MCHC: 33.5 g/dL (ref 30.0–36.0)
MCV: 89.7 fL (ref 80.0–100.0)
Monocytes Absolute: 0.3 K/uL (ref 0.1–1.0)
Monocytes Relative: 8 %
Neutro Abs: 2.5 K/uL (ref 1.7–7.7)
Neutrophils Relative %: 55 %
Platelets: 224 K/uL (ref 150–400)
RBC: 4.26 MIL/uL (ref 3.87–5.11)
RDW: 13 % (ref 11.5–15.5)
WBC: 4.4 K/uL (ref 4.0–10.5)
nRBC: 0 % (ref 0.0–0.2)

## 2024-06-25 LAB — TROPONIN T, HIGH SENSITIVITY: Troponin T High Sensitivity: <15 ng/L (ref 0–19)

## 2024-06-25 LAB — COMPREHENSIVE METABOLIC PANEL WITH GFR
ALT: 16 U/L (ref 0–44)
AST: 24 U/L (ref 15–41)
Albumin: 4.2 g/dL (ref 3.5–5.0)
Alkaline Phosphatase: 98 U/L (ref 38–126)
Anion gap: 12 (ref 5–15)
BUN: 6 mg/dL — ABNORMAL LOW (ref 8–23)
CO2: 30 mmol/L (ref 22–32)
Calcium: 9.2 mg/dL (ref 8.9–10.3)
Chloride: 98 mmol/L (ref 98–111)
Creatinine, Ser: 1.08 mg/dL — ABNORMAL HIGH (ref 0.44–1.00)
GFR, Estimated: 58 mL/min — ABNORMAL LOW
Glucose, Bld: 112 mg/dL — ABNORMAL HIGH (ref 70–99)
Potassium: 3.2 mmol/L — ABNORMAL LOW (ref 3.5–5.1)
Sodium: 139 mmol/L (ref 135–145)
Total Bilirubin: 0.3 mg/dL (ref 0.0–1.2)
Total Protein: 7 g/dL (ref 6.5–8.1)

## 2024-06-25 LAB — LIPASE, BLOOD: Lipase: 22 U/L (ref 11–51)

## 2024-06-25 LAB — CBG MONITORING, ED: Glucose-Capillary: 106 mg/dL — ABNORMAL HIGH (ref 70–99)

## 2024-06-25 LAB — ETHANOL: Alcohol, Ethyl (B): <15 mg/dL

## 2024-06-25 MED ORDER — SODIUM CHLORIDE 0.9% FLUSH
3.0000 mL | Freq: Two times a day (BID) | INTRAVENOUS | Status: DC
  Filled 2024-06-25: qty 3

## 2024-06-25 MED ORDER — POTASSIUM CHLORIDE CRYS ER 20 MEQ PO TBCR
40.0000 meq | EXTENDED_RELEASE_TABLET | Freq: Once | ORAL | Status: AC
  Administered 2024-06-25: 40 meq via ORAL
  Filled 2024-06-25: qty 2

## 2024-06-25 MED ORDER — LORAZEPAM 2 MG/ML IJ SOLN: 1.0000 mg | INTRAMUSCULAR | Status: DC | PRN

## 2024-06-25 MED ORDER — ACETAMINOPHEN 650 MG RE SUPP: 650.0000 mg | Freq: Four times a day (QID) | RECTAL | Status: DC | PRN

## 2024-06-25 MED ORDER — SENNOSIDES-DOCUSATE SODIUM 8.6-50 MG PO TABS: 1.0000 | ORAL_TABLET | Freq: Every evening | ORAL | Status: DC | PRN

## 2024-06-25 MED ORDER — GABAPENTIN 300 MG PO CAPS: 300.0000 mg | ORAL_CAPSULE | Freq: Two times a day (BID) | ORAL | Status: DC

## 2024-06-25 MED ORDER — ONDANSETRON HCL 4 MG/2ML IJ SOLN: 4.0000 mg | Freq: Four times a day (QID) | INTRAMUSCULAR | Status: DC | PRN

## 2024-06-25 MED ORDER — PANTOPRAZOLE SODIUM 40 MG PO TBEC: 40.0000 mg | DELAYED_RELEASE_TABLET | Freq: Every morning | ORAL | Status: DC

## 2024-06-25 MED ORDER — PROCHLORPERAZINE EDISYLATE 10 MG/2ML IJ SOLN
10.0000 mg | Freq: Once | INTRAMUSCULAR | Status: AC
  Administered 2024-06-25: 10 mg via INTRAVENOUS
  Filled 2024-06-25: qty 2

## 2024-06-25 MED ORDER — ONDANSETRON HCL 4 MG PO TABS: 4.0000 mg | ORAL_TABLET | Freq: Four times a day (QID) | ORAL | Status: DC | PRN

## 2024-06-25 MED ORDER — MAGNESIUM SULFATE 2 GM/50ML IV SOLN
2.0000 g | Freq: Once | INTRAVENOUS | Status: AC
  Administered 2024-06-25: 2 g via INTRAVENOUS
  Filled 2024-06-25: qty 50

## 2024-06-25 MED ORDER — SODIUM CHLORIDE 0.9 % IV BOLUS
500.0000 mL | Freq: Once | INTRAVENOUS | Status: AC
  Administered 2024-06-25: 500 mL via INTRAVENOUS

## 2024-06-25 MED ORDER — ACETAMINOPHEN 500 MG PO TABS
1000.0000 mg | ORAL_TABLET | Freq: Once | ORAL | Status: AC
  Administered 2024-06-25: 1000 mg via ORAL
  Filled 2024-06-25: qty 2

## 2024-06-25 MED ORDER — SENNOSIDES-DOCUSATE SODIUM 8.6-50 MG PO TABS: 2.0000 | ORAL_TABLET | Freq: Two times a day (BID) | ORAL | Status: DC

## 2024-06-25 MED ORDER — KETOROLAC TROMETHAMINE 15 MG/ML IJ SOLN
15.0000 mg | Freq: Once | INTRAMUSCULAR | Status: AC
  Administered 2024-06-25: 15 mg via INTRAVENOUS
  Filled 2024-06-25: qty 1

## 2024-06-25 MED ORDER — HYDRALAZINE HCL 20 MG/ML IJ SOLN: 5.0000 mg | INTRAMUSCULAR | Status: DC | PRN

## 2024-06-25 MED ORDER — TRAZODONE HCL 50 MG PO TABS: 100.0000 mg | ORAL_TABLET | Freq: Every evening | ORAL | Status: DC | PRN

## 2024-06-25 MED ORDER — PROPRANOLOL HCL ER 60 MG PO CP24: 60.0000 mg | ORAL_CAPSULE | Freq: Every day | ORAL | Status: DC

## 2024-06-25 MED ORDER — BUTALBITAL-APAP-CAFFEINE 50-325-40 MG PO CAPS: 1.0000 | ORAL_CAPSULE | Freq: Four times a day (QID) | ORAL | Status: DC | PRN

## 2024-06-25 MED ORDER — DEXAMETHASONE SOD PHOSPHATE PF 10 MG/ML IJ SOLN
10.0000 mg | Freq: Once | INTRAMUSCULAR | Status: AC
  Administered 2024-06-25: 10 mg via INTRAVENOUS

## 2024-06-25 MED ORDER — FLUTICASONE PROPIONATE 50 MCG/ACT NA SUSP: 1.0000 | Freq: Every day | NASAL | Status: DC | PRN

## 2024-06-25 MED ORDER — ACETAMINOPHEN 325 MG PO TABS: 650.0000 mg | ORAL_TABLET | Freq: Four times a day (QID) | ORAL | Status: DC | PRN

## 2024-06-25 MED ORDER — IOHEXOL 300 MG/ML  SOLN
100.0000 mL | Freq: Once | INTRAMUSCULAR | Status: AC | PRN
  Administered 2024-06-25: 100 mL via INTRAVENOUS

## 2024-06-25 MED ORDER — POLYETHYLENE GLYCOL 3350 17 G PO PACK: 17.0000 g | PACK | Freq: Every day | ORAL | Status: DC | PRN

## 2024-06-25 NOTE — Assessment & Plan Note (Addendum)
 Seizure precaution, EEG ordered Etiology work up in progress Ativan  1 mg IV prn for seizure, 2 doses ordered, with instructions to administer as appropriate and then let provider know Fall precautions, aspiration precautions

## 2024-06-25 NOTE — Assessment & Plan Note (Signed)
 3 syncopes events in three weeks, 1 witness Fall and aspiration precautions MRI of brain EEG ordered Neurology can be consulted pending arriving to [facility], telemetry bed

## 2024-06-25 NOTE — ED Notes (Addendum)
 RN updated pt regarding current room assignment, that pt was currently waiting on transport team, and pt expressed that I can't go right now, I need to get my grandkids situated. Admitting provider, Dr Sherre made aware.
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
## 2024-06-25 NOTE — ED Notes (Signed)
 Called [facility] for transfer @16 :47.   Spoke with Debby

## 2024-06-25 NOTE — ED Notes (Addendum)
 Pt appears sleepy while RN administering medications. Pt asked to go to bathroom, RN assisted pt to stand.  Pt appeared unsteady on her feet, RN encouraged pt to ambulate forward to exit of room.  Pt attempted walking with eyes closed, almost fell backwards. Pt AOx4, able to follow commands.  RN assisted pt to right herself, standby assistance to bathroom and back to stretcher.  EDP made aware.

## 2024-06-25 NOTE — ED Notes (Signed)
 RN called to room, pt reports she does not want to be admitted to hospital because my grandkids are here. EDP Young made aware.

## 2024-06-25 NOTE — ED Notes (Addendum)
 Pt again reported that she does not want to be admitted to hospital, RN informed her that she would be leaving AMA. Pt verbalized understanding. EDP Randol made aware.

## 2024-06-25 NOTE — ED Notes (Signed)
 ED Provider at bedside.
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
## 2024-06-25 NOTE — Assessment & Plan Note (Signed)
 PDMP reviewed Active prescription: gabapentin  300 mg, 120 capsules, filled on 05/27/24

## 2024-06-25 NOTE — Assessment & Plan Note (Addendum)
 Home hydrochlorothiazide , furosemide not resumed as pt has hypokalemia Home propranolol  60 mg PO daily resumed Hydralazine  5 mg IV Q4h prn for sbp > 165, 5 days ordered

## 2024-06-25 NOTE — ED Notes (Signed)
 Virtual hospitalist at bedside
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
## 2024-06-25 NOTE — Assessment & Plan Note (Signed)
 Trazodone  100 mg nightly as needed for sleep

## 2024-06-25 NOTE — Hospital Course (Addendum)
 Ms. Kirsten Walsh is a 72 female with history of headaches, hypertension, migraine, insomnia.  06/25/24: She presents to ED for chief concerns of syncope.  Vitals at the time of my evaluation showed t of 98.6, rr 16, hr 66, blood pressure 134/70, SpO2 98% on room air.  Serum sodium is 139, potassium 3.2, chloride 98, bicarb 30, BUN of 6, serum creatinine 1.08, eGFR 58, nonfasting glucose 112, WBC 4.4, hemoglobin 12.8, platelets of 224.  HS Trops is< 15.  EtOH level is < 15.  ED treatment: Acetaminophen  1000 mg p.o. one-time dose, Decadron  10 mg IV one-time dose, Toradol  15 mg IV one-time dose, potassium chloride  40 mEq PO once, Compazine  10 mg IV one-time dose, magnesium  2 g IV one-time dose, sodium chloride  500 mL liter bolus.  06/25/24: Patient admitted via virtual admission note and assessment.

## 2024-07-06 ENCOUNTER — Emergency Department (HOSPITAL_COMMUNITY)

## 2024-07-06 ENCOUNTER — Emergency Department (HOSPITAL_COMMUNITY)
Admission: EM | Admit: 2024-07-06 | Discharge: 2024-07-07 | Disposition: A | Discharge status: Home / Self Care | Attending: Emergency Medicine | Admitting: Emergency Medicine

## 2024-07-06 ENCOUNTER — Other Ambulatory Visit: Payer: Self-pay

## 2024-07-06 ENCOUNTER — Encounter (HOSPITAL_COMMUNITY): Payer: Self-pay | Admitting: Emergency Medicine

## 2024-07-06 DIAGNOSIS — R4020 Unspecified coma: Secondary | ICD-10-CM

## 2024-07-06 DIAGNOSIS — R519 Headache, unspecified: Secondary | ICD-10-CM | POA: Insufficient documentation

## 2024-07-06 DIAGNOSIS — R55 Syncope and collapse: Secondary | ICD-10-CM | POA: Insufficient documentation

## 2024-07-06 LAB — COMPREHENSIVE METABOLIC PANEL WITH GFR
ALT: 25 U/L (ref 0–44)
AST: 29 U/L (ref 15–41)
Albumin: 3.8 g/dL (ref 3.5–5.0)
Alkaline Phosphatase: 95 U/L (ref 38–126)
Anion gap: 9 (ref 5–15)
BUN: 13 mg/dL (ref 8–23)
CO2: 29 mmol/L (ref 22–32)
Calcium: 8.9 mg/dL (ref 8.9–10.3)
Chloride: 98 mmol/L (ref 98–111)
Creatinine, Ser: 1.04 mg/dL — ABNORMAL HIGH (ref 0.44–1.00)
GFR, Estimated: >60 mL/min
Glucose, Bld: 109 mg/dL — ABNORMAL HIGH (ref 70–99)
Potassium: 3.5 mmol/L (ref 3.5–5.1)
Sodium: 136 mmol/L (ref 135–145)
Total Bilirubin: 0.4 mg/dL (ref 0.0–1.2)
Total Protein: 6.5 g/dL (ref 6.5–8.1)

## 2024-07-06 LAB — CBC WITH DIFFERENTIAL/PLATELET
Abs Immature Granulocytes: 0.02 K/uL (ref 0.00–0.07)
Basophils Absolute: 0 K/uL (ref 0.0–0.1)
Basophils Relative: 1 %
Eosinophils Absolute: 0.1 K/uL (ref 0.0–0.5)
Eosinophils Relative: 2 %
HCT: 38.9 % (ref 36.0–46.0)
Hemoglobin: 12.7 g/dL (ref 12.0–15.0)
Immature Granulocytes: 1 %
Lymphocytes Relative: 36 %
Lymphs Abs: 1.6 K/uL (ref 0.7–4.0)
MCH: 30.5 pg (ref 26.0–34.0)
MCHC: 32.6 g/dL (ref 30.0–36.0)
MCV: 93.5 fL (ref 80.0–100.0)
Monocytes Absolute: 0.4 K/uL (ref 0.1–1.0)
Monocytes Relative: 10 %
Neutro Abs: 2.2 K/uL (ref 1.7–7.7)
Neutrophils Relative %: 50 %
Platelets: 215 K/uL (ref 150–400)
RBC: 4.16 MIL/uL (ref 3.87–5.11)
RDW: 13.3 % (ref 11.5–15.5)
WBC: 4.3 K/uL (ref 4.0–10.5)
nRBC: 0 % (ref 0.0–0.2)

## 2024-07-06 LAB — I-STAT CHEM 8, ED
BUN: 13 mg/dL (ref 8–23)
Calcium, Ion: 1.1 mmol/L — ABNORMAL LOW (ref 1.15–1.40)
Chloride: 97 mmol/L — ABNORMAL LOW (ref 98–111)
Creatinine, Ser: 1.2 mg/dL — ABNORMAL HIGH (ref 0.44–1.00)
Glucose, Bld: 109 mg/dL — ABNORMAL HIGH (ref 70–99)
HCT: 40 % (ref 36.0–46.0)
Hemoglobin: 13.6 g/dL (ref 12.0–15.0)
Potassium: 3.3 mmol/L — ABNORMAL LOW (ref 3.5–5.1)
Sodium: 138 mmol/L (ref 135–145)
TCO2: 29 mmol/L (ref 22–32)

## 2024-07-06 LAB — CBG MONITORING, ED: Glucose-Capillary: 105 mg/dL — ABNORMAL HIGH (ref 70–99)

## 2024-07-06 MED ORDER — DIPHENHYDRAMINE HCL 50 MG/ML IJ SOLN
12.5000 mg | Freq: Once | INTRAMUSCULAR | Status: AC
  Administered 2024-07-06: 12.5 mg via INTRAVENOUS
  Filled 2024-07-06: qty 1

## 2024-07-06 MED ORDER — SODIUM CHLORIDE 0.9 % IV BOLUS
500.0000 mL | Freq: Once | INTRAVENOUS | Status: AC
  Administered 2024-07-06: 500 mL via INTRAVENOUS

## 2024-07-06 MED ORDER — METOCLOPRAMIDE HCL 5 MG/ML IJ SOLN
10.0000 mg | Freq: Once | INTRAMUSCULAR | Status: AC
  Administered 2024-07-06: 10 mg via INTRAVENOUS
  Filled 2024-07-06: qty 2

## 2024-07-06 NOTE — ED Notes (Signed)
 Patient given discharge and follow-up instructions. Patient verbalized understanding. Patient left via private vehicle.

## 2024-07-06 NOTE — ED Provider Notes (Signed)
 " [place] [facility] Provider Note   CSN: [id number] Arrival date & time: 07/06/24  1744     Patient presents with: Loss of Consciousness and Migraine   Kirsten Walsh is a 64 y.o. female.  {Add pertinent medical, surgical, social history, OB history to HPI:9030} 64 year old female history of recurrent syncope who has undergone an extensive workup presents emergency room with syncopal episode.  Patient reports she was at work when she passed out.  Thinks she hit her head.  No preceding symptoms.  No seizure-like activity was seen this time.  Has had prior episodes that have had some myoclonic jerking.  She has had a Zio patch that was normal.  Echo was normal.  MRI of the brain was normal.  Has had multiple EEGs as well which were not able to capture seizure-like activity.  Complaining of a mild headache now       Prior to Admission medications  Medication Sig Start Date End Date Taking? Authorizing Provider  amitriptyline  (ELAVIL ) 50 MG tablet Take 1 tablet by mouth at bedtime as needed.    [provider]  Butalbital -APAP-Caffeine  50-325-40 MG capsule Take 1 capsule by mouth every 6 (six) hours as needed for headache (migraine). 09/04/20   [provider]  cetirizine (ZYRTEC) 10 MG tablet Take 10 mg by mouth daily as needed for allergies.    [provider]  cloNIDine  (CATAPRES ) 0.1 MG tablet TAKE 1 TABLET(0.1 MG) BY MOUTH TWICE DAILY 08/28/18   Lanny Maxwell, MD  Diclofenac  Sodium 3 % GEL Apply 1 application topically 2 (two) times daily as needed (pain). 09/06/20   [provider]  EPINEPHrine  0.3 mg/0.3 mL IJ SOAJ injection Inject 0.3 mg into the muscle as needed for anaphylaxis. 09/18/20   [provider]  fluticasone  (FLONASE ) 50 MCG/ACT nasal spray Place 1 spray into both nostrils daily as needed for allergies or rhinitis.    [provider]  furosemide (LASIX) 20 MG tablet Take 20 mg by  mouth daily as needed (swelling).    [provider]  gabapentin  (NEURONTIN ) 300 MG capsule Take 300 mg by mouth 2 (two) times daily. 08/26/20   [provider]  hydrochlorothiazide  (HYDRODIURIL ) 25 MG tablet TAKE 1 TABLET(25 MG) BY MOUTH DAILY 08/28/18   Lanny Maxwell, MD  lidocaine  (XYLOCAINE ) 5 % ointment Apply 1 Application topically daily as needed for moderate pain. 10/19/21   [provider]  lisinopril -hydrochlorothiazide  (ZESTORETIC ) 20-12.5 MG tablet Take 1 tablet by mouth daily. Patient not taking: Reported on 12/16/2023 12/28/12   [provider]  naloxone [facility]) nasal spray 4 mg/0.1 mL SMARTSIG:Spray(s) Both Nares Patient not taking: Reported on 12/16/2023 08/24/22   [provider]  ondansetron  (ZOFRAN ) 4 MG tablet Take 4 mg by mouth 3 (three) times daily as needed for nausea or vomiting.    [provider]  oxyCODONE  (ROXICODONE ) 15 MG immediate release tablet Take 15 mg by mouth every 6 (six) hours. 09/04/20   [provider]  oxyCODONE -acetaminophen  (PERCOCET) 10-325 MG tablet Take by mouth. Patient not taking: Reported on 12/16/2023 12/28/12   [provider]  pantoprazole  (PROTONIX ) 40 MG tablet Take 40 mg by mouth in the morning.    [provider]  Phendimetrazine  Tartrate 105 MG CP24 Take 105 mg by mouth daily as needed (weight loss). Last dose approx 12/22/20 per pt 09/21/20   [provider]  phentermine (ADIPEX-P) 37.5 MG tablet Take by mouth. 07/13/22   [provider]  polyethylene glycol (MIRALAX  / GLYCOLAX ) 17 g packet Take 17 g by mouth daily as needed for moderate constipation.    [provider]  potassium chloride  (KLOR-CON ) 10 MEQ tablet Take 2 tablets (20 mEq total) by mouth daily. 02/20/20   Hope Merle, MD  prochlorperazine  (COMPAZINE ) 10 MG tablet Take 1 tablet (10 mg total) by mouth 2 (two) times daily as needed for nausea or vomiting. 05/01/24   Theophilus Pagan, MD   promethazine  (PHENERGAN ) 25 MG tablet Take 1 tablet (25 mg total) by mouth every 6 (six) hours as needed for nausea or vomiting. 04/30/19   Wieters, Hallie C, PA-C  propranolol  ER (INDERAL  LA) 60 MG 24 hr capsule Take 60 mg by mouth daily. 03/14/24 03/14/25  [provider]  senna-docusate (SENOKOT-S) 8.6-50 MG tablet Take 2 tablets by mouth 2 (two) times daily. 12/19/21   Akula, Vijaya, MD  SUMAtriptan  (IMITREX ) 100 MG tablet Take 100 mg by mouth every 2 (two) hours as needed for migraine. May repeat in 2 hours if headache persists or recurs.    [provider]  tiZANidine  (ZANAFLEX ) 4 MG capsule Take 1 capsule (4 mg total) by mouth at bedtime as needed for muscle spasms. 12/19/21   Akula, Vijaya, MD  traZODone  (DESYREL ) 100 MG tablet Take 100 mg by mouth at bedtime as needed for sleep. Patient not taking: Reported on 12/16/2023 12/07/21   [provider]  UBRELVY 100 MG TABS Take by mouth. 05/28/24   [provider]  Vitamin D , Ergocalciferol , (DRISDOL ) 1.25 MG (50000 UNIT) CAPS capsule Take 50,000 Units by mouth every Monday. 09/14/20   [provider]    Allergies: Apple, Aspirin, Daucus carota, and Haloperidol  and related    Review of Systems  Updated Vital Signs BP 133/84   Pulse 78   Temp 98.2 F (36.8 C) (Oral)   Resp 14   LMP  (LMP Unknown)   SpO2 99%   Physical Exam Vitals and nursing note reviewed.  Constitutional:      General: She is not in acute distress.    Appearance: She is well-developed.  HENT:     Head: Normocephalic and atraumatic.     Right Ear: External ear normal.     Left Ear: External ear normal.     Nose: Nose normal.  Eyes:     Extraocular Movements: Extraocular movements intact.     Conjunctiva/sclera: Conjunctivae normal.     Pupils: Pupils are equal, round, and reactive to light.     Comments: Pupils 5 mm bilaterally  Neck:     Comments: No midline C-spine tenderness palpation Cardiovascular:     Rate and  Rhythm: Normal rate and regular rhythm.     Heart sounds: No murmur heard. Pulmonary:     Effort: Pulmonary effort is normal. No respiratory distress.     Breath sounds: Normal breath sounds.  Musculoskeletal:     Cervical back: Normal range of motion and neck supple.     Right lower leg: No edema.     Left lower leg: No edema.  Skin:    General: Skin is warm and dry.  Neurological:     Mental Status: She is alert and oriented to person, place, and time. Mental status is at baseline.     Cranial Nerves: No cranial nerve deficit.     Sensory: No sensory deficit.     Motor: No weakness.     Coordination: Coordination normal.     Comments:  No aphasia  Psychiatric:        Mood and Affect: Mood normal.     (all labs ordered are listed, but only abnormal results are displayed) Labs Reviewed  CBG MONITORING, ED - Abnormal; Notable for the following components:      Result Value   Glucose-Capillary 105 (*)    All other components within normal limits  I-STAT CHEM 8, ED - Abnormal; Notable for the following components:   Potassium 3.3 (*)    Chloride 97 (*)    Creatinine, Ser 1.20 (*)    Glucose, Bld 109 (*)    Calcium, Ion 1.10 (*)    All other components within normal limits  CBC WITH DIFFERENTIAL/PLATELET  COMPREHENSIVE METABOLIC PANEL WITH GFR    EKG: EKG Interpretation Date/Time:  Friday July 06 2024 17:54:18 EST Ventricular Rate:  80 PR Interval:  149 QRS Duration:  99 QT Interval:  419 QTC Calculation: 484 R Axis:   -1  Text Interpretation: Sinus rhythm Probable left ventricular hypertrophy Borderline T abnormalities, anterior leads Confirmed by Yolande Charleston [phone]) on 07/06/2024 8:12:05 PM  Radiology: [facility] Chest Port 1 View Result Date: 07/06/2024 EXAM: 1 VIEW(S) XRAY OF THE CHEST 07/06/2024 07:34:00 PM COMPARISON: Comparison 06/25/2024. CLINICAL HISTORY: syncope FINDINGS: LUNGS AND PLEURA: No focal pulmonary opacity. No pleural effusion. No pneumothorax.  HEART AND MEDIASTINUM: No acute abnormality of the cardiac and mediastinal silhouettes. BONES AND SOFT TISSUES: No acute osseous abnormality. IMPRESSION: 1. No acute cardiopulmonary pathology. Electronically signed by: Franky Crease MD 07/06/2024 07:36 PM EST RP Workstation: [id number]   CT Head Wo Contrast Result Date: 07/06/2024 EXAM: CT HEAD WITHOUT CONTRAST 07/06/2024 06:47:14 PM TECHNIQUE: CT of the head was performed without the administration of intravenous contrast. Automated exposure control, iterative reconstruction, and/or weight based adjustment of the mA/kV was utilized to reduce the radiation dose to as low as reasonably achievable. COMPARISON: 06/25/2024 CLINICAL HISTORY: syncope, headache FINDINGS: BRAIN AND VENTRICLES: Cavum septum pellucidum present. No acute hemorrhage. No evidence of acute infarct. No hydrocephalus. No extra-axial collection. No mass effect or midline shift. ORBITS: No acute abnormality. SINUSES: No acute abnormality. SOFT TISSUES AND SKULL: No acute soft tissue abnormality. No skull fracture. IMPRESSION: 1. No acute intracranial abnormality. Electronically signed by: Lonni Necessary MD 07/06/2024 07:09 PM EST RP Workstation: [id number]    {Document cardiac monitor, telemetry assessment procedure when appropriate:32947} Procedures   Medications Ordered in the ED - No data to display    {Click here for ABCD2, HEART and other calculators REFRESH Note before signing:1}                              Medical Decision Making Amount and/or Complexity of Data Reviewed Labs: ordered. Radiology: ordered.  Risk Prescription drug management.   ***  {Document critical care time when appropriate  Document review of labs and clinical decision tools ie CHADS2VASC2, etc  Document your independent review of radiology images and any outside records  Document your discussion with family members, caretakers and with consultants  Document social determinants of health  affecting pt's care  Document your decision making why or why not admission, treatments were needed:32947:::1}   Final diagnoses:  None    ED Discharge Orders     None        "

## 2024-07-06 NOTE — ED Triage Notes (Signed)
 Patient BIB [facility] from the [place] she [place]. Patient had a syncope episode she was LOC. Patient C? Migraine, N/V. She does have HX of migraine. She had migraine and syncope several times in the pass and was seen by a neurology before. He was given Zofran  4 mg and Fentanyl  100 MCG. Patient is A & O x4. Vital sign: BP 136/86, Pulse 82, RR 18, SPO2 97% RA, CBG 111.

## 2024-07-06 NOTE — Discharge Instructions (Signed)
 You were seen for your loss of consciousness in the emergency department. It may have been due to a seizure but you need more testing to determine this  At home, please continue to take the headache medication that you have been prescribed.  Refrain from activities that could be dangerous when you have a seizure such as climbing a ladder or swimming.  Do not drive for at least 6 months.  Talk to your neurologist to see when it is safe to resume these activities.    Check your MyChart online for the results of any tests that had not resulted by the time you left the emergency department.   Follow-up with your primary doctor in 2-3 days regarding your visit.  Follow-up with your neurologist as soon as possible  Return immediately to the emergency department if you experience any of the following: Seizures lasting more than 5 minutes, back to back seizures, or any other concerning symptoms.    Thank you for visiting our Emergency Department. It was a pleasure taking care of you today.
